# Patient Record
Sex: Male | Born: 1950
Health system: Southern US, Community
[De-identification: ages and names within clinical notes are randomized; demographics above are authoritative.]

## PROBLEM LIST (undated history)

## (undated) DIAGNOSIS — D649 Anemia, unspecified: Secondary | ICD-10-CM

## (undated) DIAGNOSIS — Z87442 Personal history of urinary calculi: Secondary | ICD-10-CM

## (undated) DIAGNOSIS — N390 Urinary tract infection, site not specified: Secondary | ICD-10-CM

## (undated) DIAGNOSIS — C61 Malignant neoplasm of prostate: Secondary | ICD-10-CM

---

## 2004-10-22 ENCOUNTER — Emergency Department (HOSPITAL_COMMUNITY): Admission: EM | Admit: 2004-10-22 | Discharge: 2004-10-23 | Payer: Self-pay | Admitting: Emergency Medicine

## 2004-11-06 ENCOUNTER — Ambulatory Visit (HOSPITAL_COMMUNITY): Admission: RE | Admit: 2004-11-06 | Discharge: 2004-11-06 | Payer: Self-pay | Admitting: Family Medicine

## 2007-10-12 ENCOUNTER — Emergency Department (HOSPITAL_COMMUNITY): Admission: EM | Admit: 2007-10-12 | Discharge: 2007-10-12 | Payer: Self-pay | Admitting: Emergency Medicine

## 2012-08-09 ENCOUNTER — Encounter: Payer: Self-pay | Admitting: Nurse Practitioner

## 2012-09-19 ENCOUNTER — Ambulatory Visit: Payer: Self-pay | Admitting: Nurse Practitioner

## 2019-02-20 ENCOUNTER — Emergency Department (HOSPITAL_COMMUNITY)
Admission: EM | Admit: 2019-02-20 | Discharge: 2019-02-21 | Disposition: A | Discharge status: Home / Self Care | Payer: Medicaid Other | Attending: Emergency Medicine | Admitting: Emergency Medicine

## 2019-02-20 ENCOUNTER — Emergency Department (HOSPITAL_COMMUNITY): Payer: Medicaid Other

## 2019-02-20 ENCOUNTER — Other Ambulatory Visit: Payer: Self-pay

## 2019-02-20 DIAGNOSIS — N400 Enlarged prostate without lower urinary tract symptoms: Secondary | ICD-10-CM | POA: Diagnosis not present

## 2019-02-20 DIAGNOSIS — R339 Retention of urine, unspecified: Secondary | ICD-10-CM | POA: Diagnosis not present

## 2019-02-20 DIAGNOSIS — K5900 Constipation, unspecified: Secondary | ICD-10-CM | POA: Insufficient documentation

## 2019-02-20 DIAGNOSIS — R319 Hematuria, unspecified: Secondary | ICD-10-CM | POA: Diagnosis not present

## 2019-02-20 DIAGNOSIS — R300 Dysuria: Secondary | ICD-10-CM | POA: Diagnosis present

## 2019-02-20 LAB — COMPREHENSIVE METABOLIC PANEL
ALT: 12 U/L (ref 0–44)
AST: 18 U/L (ref 15–41)
Albumin: 3.4 g/dL — ABNORMAL LOW (ref 3.5–5.0)
Alkaline Phosphatase: 64 U/L (ref 38–126)
Anion gap: 11 (ref 5–15)
BUN: 8 mg/dL (ref 8–23)
CO2: 25 mmol/L (ref 22–32)
Calcium: 9.5 mg/dL (ref 8.9–10.3)
Chloride: 103 mmol/L (ref 98–111)
Creatinine, Ser: 1.14 mg/dL (ref 0.61–1.24)
GFR calc Af Amer: >60 mL/min (ref >60)
GFR calc non Af Amer: >60 mL/min (ref >60)
Glucose, Bld: 121 mg/dL — ABNORMAL HIGH (ref 70–99)
Potassium: 3.7 mmol/L (ref 3.5–5.1)
Sodium: 139 mmol/L (ref 135–145)
Total Bilirubin: 0.5 mg/dL (ref 0.3–1.2)
Total Protein: 8.2 g/dL — ABNORMAL HIGH (ref 6.5–8.1)

## 2019-02-20 LAB — URINALYSIS, ROUTINE W REFLEX MICROSCOPIC
Bacteria, UA: NONE SEEN
Bilirubin Urine: NEGATIVE
Glucose, UA: NEGATIVE mg/dL
Ketones, ur: NEGATIVE mg/dL
Leukocytes,Ua: NEGATIVE
Nitrite: NEGATIVE
Protein, ur: 100 mg/dL — AB
RBC / HPF: >50 RBC/hpf — ABNORMAL HIGH (ref 0–5)
Specific Gravity, Urine: 1.017 (ref 1.005–1.030)
pH: 5 (ref 5.0–8.0)

## 2019-02-20 LAB — CBC
HCT: 41.6 % (ref 39.0–52.0)
Hemoglobin: 13.4 g/dL (ref 13.0–17.0)
MCH: 29.6 pg (ref 26.0–34.0)
MCHC: 32.2 g/dL (ref 30.0–36.0)
MCV: 91.8 fL (ref 80.0–100.0)
Platelets: 437 10*3/uL — ABNORMAL HIGH (ref 150–400)
RBC: 4.53 MIL/uL (ref 4.22–5.81)
RDW: 12.7 % (ref 11.5–15.5)
WBC: 8.8 10*3/uL (ref 4.0–10.5)
nRBC: 0 % (ref 0.0–0.2)

## 2019-02-20 MED ORDER — IOHEXOL 300 MG/ML  SOLN
100.0000 mL | Freq: Once | INTRAMUSCULAR | Status: AC | PRN
  Administered 2019-02-20: 23:00:00 100 mL via INTRAVENOUS

## 2019-02-20 NOTE — ED Triage Notes (Signed)
Pt endorses having intermittent difficulty urinating and constipation for several months ,worse last night. States it hurts to urinate or defecate. Axox4.

## 2019-02-20 NOTE — ED Notes (Signed)
Bladder scan 405 ml

## 2019-02-20 NOTE — ED Provider Notes (Signed)
[facility] Provider Note   CSN: [id number] Arrival date & time: 02/20/19  1545     History   Chief Complaint Chief Complaint  Patient presents with  . Dysuria  . Constipation    HPI [facility] Glass is a 68 y.o. male.     The history is provided by the patient and medical records. No language interpreter was used.  Dysuria Presenting symptoms: dysuria   Constipation Associated symptoms: dysuria    [facility] Glass is a 68 y.o. male who presents to the Emergency Department complaining of dysuria and constipation. He presents the emergency department complaining of dysuria and constipation that began over the last couple of days. Symptoms became severe last night with him only being able to have a dribbling urination. He has pain upon trying to urinate. He also reports constipation with only being able to pass small pellets. He does have some bilateral low back pain that he experiences with straining. He denies any fevers, chest pain, shortness of breath, nausea, vomiting, abdominal pain, numbness, weakness. He denies any medical problems but does not have a primary care provider. He lives alone. He works as a Theme park manager. No known COVID 19 exposures. He states that he has a remote history of kidney stones and those presenting symptoms were similar.  No past medical history on file.  There are no active problems to display for this patient.         Home Medications    Prior to Admission medications   Not on File    Family History No family history on file.  Social History Social History   Tobacco Use  . Smoking status: Not on file  Substance Use Topics  . Alcohol use: Not on file  . Drug use: Not on file     Allergies   Ibuprofen and Shrimp [shellfish allergy]   Review of Systems Review of Systems  Gastrointestinal: Positive for constipation.  Genitourinary: Positive for dysuria.  All other systems reviewed and are negative.     Physical Exam Updated Vital Signs BP (!) 162/82 (BP Location: Left Arm)   Pulse 67   Temp 98.1 F (36.7 C) (Oral)   Resp 16   SpO2 100%   Physical Exam Vitals signs and nursing note reviewed.  Constitutional:      Appearance: He is well-developed.  HENT:     Head: Normocephalic and atraumatic.  Cardiovascular:     Rate and Rhythm: Normal rate and regular rhythm.  Pulmonary:     Effort: Pulmonary effort is normal. No respiratory distress.  Abdominal:     Palpations: Abdomen is soft.     Tenderness: There is no abdominal tenderness. There is no guarding or rebound.  Genitourinary:    Penis: Normal.      Comments: Nontender rectal examination. Normal rectal tone. No gross blood. No stool and rectal vault. Musculoskeletal:        General: No tenderness.     Comments: 2+ DP pulses bilaterally. Five out of five strength and bilateral lower extremities with sensation to light touch intact and bilateral lower extremities  Skin:    General: Skin is warm and dry.  Neurological:     Mental Status: He is alert and oriented to person, place, and time.  Psychiatric:        Behavior: Behavior normal.      ED Treatments / Results  Labs (all labs ordered are listed, but only abnormal results are displayed) Labs  Reviewed  CBC - Abnormal; Notable for the following components:      Result Value   Platelets 437 (*)    All other components within normal limits  COMPREHENSIVE METABOLIC PANEL - Abnormal; Notable for the following components:   Glucose, Bld 121 (*)    Total Protein 8.2 (*)    Albumin 3.4 (*)    All other components within normal limits  URINALYSIS, ROUTINE W REFLEX MICROSCOPIC - Abnormal; Notable for the following components:   Hgb urine dipstick MODERATE (*)    Protein, ur 100 (*)    RBC / HPF >50 (*)    All other components within normal limits  URINE CULTURE    EKG Glass  Radiology Ct Abdomen Pelvis W Contrast  Result Date: 02/20/2019 CLINICAL DATA:   Hematuria. Difficulty urinating. Constipation. Weight loss. EXAM: CT ABDOMEN AND PELVIS WITH CONTRAST TECHNIQUE: Multidetector CT imaging of the abdomen and pelvis was performed using the standard protocol following bolus administration of intravenous contrast. CONTRAST:  160mL OMNIPAQUE IOHEXOL 300 MG/ML  SOLN COMPARISON:  [facility]. FINDINGS: Lower chest: Lung bases are clear. No focal consolidation, pleural fluid, or pulmonary nodule. Hepatobiliary: No focal liver abnormality is seen. No gallstones, gallbladder wall thickening, or biliary dilatation. Pancreas: No ductal dilatation or inflammation. Spleen: Normal in size without focal abnormality. A 15 mm nodule anterior inferior to the spleen in the left upper quadrant series 3, image 27, isodense to splenic parenchyma may represent a splenule but is nonspecific. Adrenals/Urinary Tract: No adrenal nodule. No hydronephrosis or perinephric edema. Homogeneous renal enhancement with symmetric excretion on delayed phase imaging. No suspicious renal mass. Left retroperitoneal adenopathy causes mass effect on the left renal artery and vein. Marked prostatomegaly which is irregular and causing mass effect on the bladder base. Bladder is poorly distended with diffuse bladder wall thickening and perivesicular stranding. Stomach/Bowel: Stomach is unremarkable. No small bowel obstruction or inflammatory change. Normal appendix. Small volume of colonic stool. Minimal distal diverticulosis without diverticulitis. No obvious colonic mass, lack of enteric contrast limits detailed assessment. Vascular/Lymphatic: Multifocal retroperitoneal adenopathy. Representative left periaortic lymph node at the level of the renal artery and vein, series 3, image 35 measures 4.7 x 3.4 cm with surrounding perilymphatic edema. Multiple additional enlarged retroperitoneal nodes extending from the right renal artery to the iliac bifurcation. Prominent bilateral common iliac nodes with mild  perilymphatic edema. Enlarged bilateral external iliac nodes, 19 mm on the left series 3, image 68, 18 mm on the right series 3, image 69. Additional pelvic sidewall adenopathy on the right measuring 14 mm, series 3, image 72. Right perivesicular node measures 8 mm, series 3, image 77. Multiple prominent lymph nodes adjacent to the distal sigmoid in the pelvis, images 67, 71 and 74 series 3. Mild bilateral iliac atherosclerosis. No aortic aneurysm. The portal vein is patent. Left upper quadrant 15 mm nodule, series 3, image 27, favor splenule over lymph node. Reproductive: Markedly enlarged heterogeneous prostate gland. Prostate margins are difficult to define, however prostate measures approximately 6.4 x 6.2 x 6.5 cm (volume = 140 cm^3). This causes mass effect on the urinary bladder. There is peri prostatic fat stranding. Other: Generalized retroperitoneal stranding. Trace edema in the pericolic gutters with minimal free fluid inferior to the liver tip. No free air. Small fat containing umbilical hernia. Musculoskeletal: Multiple nonspecific sclerotic foci within the pelvis and lumbar spine. Confluent sclerosis within posterosuperior T12 is most suspicious for sclerotic metastatic disease. IMPRESSION: 1. Markedly enlarged heterogeneous prostate gland causing mass effect  on the urinary bladder base. Multifocal pelvic and retroperitoneal lymphadenopathy suggestion of lymphadenitis. Overall findings are suspicious for prostate cancer with nodal metastasis. Recommend correlation with PSA. Alternatively, lymphoproliferative disorder such as lymphoma with BPH is also considered, but felt less likely. 2. Multiple sclerotic foci within the spine and pelvis, nonspecific in the setting, however confluent sclerosis within T12 vertebral body is most suspicious for sclerotic metastatic disease. Aortic Atherosclerosis (ICD10-I70.0). Electronically Signed   By: Keith Rake M.D.   On: 02/20/2019 23:32    Procedures  Procedures (including critical care time)  Medications Ordered in ED Medications - No data to display   Initial Impression / Assessment and Plan / ED Course  I have reviewed the triage vital signs and the nursing notes.  Pertinent labs & imaging results that were available during my care of the patient were reviewed by me and considered in my medical decision making (see chart for details).        Patient here for evaluation of progressive dysuria and constipation. He is non-toxic appearing on evaluation. Labs demonstrate normal renal function. Patient found to have 400 mL of urine in his bladder on bedside bladder scan. In and out catheter was performed with return of blood-tinged urine. Plan to obtain CT abdomen pelvis to further evaluate hematuria, dysuria and constipation.  CT demonstrates prostatic enlargement and lymphadenopathy concerning for malignancy.  D/w pt findings of studies.  Discussed with Dr. Lovena Neighbours with Urology.  Plan to place foley catheter with close outpatient Urology follow up.  Return precautions were discussed.    Final Clinical Impressions(s) / ED Diagnoses   Final diagnoses:  Urinary retention  Enlarged prostate    ED Discharge Orders    [facility]       Quintella Reichert, MD 02/21/19 1210

## 2019-02-21 LAB — URINE CULTURE: Culture: NO GROWTH

## 2019-02-21 MED ORDER — TAMSULOSIN HCL 0.4 MG PO CAPS: 0.4000 mg | ORAL_CAPSULE | Freq: Every day | ORAL | 0 refills | Status: DC

## 2019-02-21 NOTE — Discharge Instructions (Addendum)
You had a CT scan performed of your abdomen pelvis today. The CT scan showed that your prostate gland is enlarged and putting pressure on your bladder. You have multiple enlarged lymph nodes in your abdomen pelvis as well. It is very important that you follow-up with urology for further evaluation. If you develop fevers, blockage of your catheter or new concerning symptoms please present to the [facility] emergency department for repeat evaluation.

## 2019-02-26 ENCOUNTER — Emergency Department (HOSPITAL_COMMUNITY): Payer: Medicaid Other

## 2019-02-26 ENCOUNTER — Encounter (HOSPITAL_COMMUNITY): Payer: Self-pay

## 2019-02-26 ENCOUNTER — Other Ambulatory Visit: Payer: Self-pay

## 2019-02-26 ENCOUNTER — Emergency Department (HOSPITAL_COMMUNITY)
Admission: EM | Admit: 2019-02-26 | Discharge: 2019-02-26 | Disposition: A | Discharge status: Home / Self Care | Payer: Medicaid Other | Attending: Emergency Medicine | Admitting: Emergency Medicine

## 2019-02-26 DIAGNOSIS — N390 Urinary tract infection, site not specified: Secondary | ICD-10-CM | POA: Diagnosis not present

## 2019-02-26 DIAGNOSIS — K5900 Constipation, unspecified: Secondary | ICD-10-CM | POA: Insufficient documentation

## 2019-02-26 DIAGNOSIS — R339 Retention of urine, unspecified: Secondary | ICD-10-CM | POA: Insufficient documentation

## 2019-02-26 DIAGNOSIS — K760 Fatty (change of) liver, not elsewhere classified: Secondary | ICD-10-CM | POA: Diagnosis not present

## 2019-02-26 DIAGNOSIS — N429 Disorder of prostate, unspecified: Secondary | ICD-10-CM | POA: Diagnosis not present

## 2019-02-26 DIAGNOSIS — N4289 Other specified disorders of prostate: Secondary | ICD-10-CM

## 2019-02-26 LAB — CBC
HCT: 39.1 % (ref 39.0–52.0)
Hemoglobin: 12.6 g/dL — ABNORMAL LOW (ref 13.0–17.0)
MCH: 29.9 pg (ref 26.0–34.0)
MCHC: 32.2 g/dL (ref 30.0–36.0)
MCV: 92.9 fL (ref 80.0–100.0)
Platelets: 358 10*3/uL (ref 150–400)
RBC: 4.21 MIL/uL — ABNORMAL LOW (ref 4.22–5.81)
RDW: 12.9 % (ref 11.5–15.5)
WBC: 9.8 10*3/uL (ref 4.0–10.5)
nRBC: 0 % (ref 0.0–0.2)

## 2019-02-26 LAB — COMPREHENSIVE METABOLIC PANEL
ALT: 13 U/L (ref 0–44)
AST: 17 U/L (ref 15–41)
Albumin: 3.3 g/dL — ABNORMAL LOW (ref 3.5–5.0)
Alkaline Phosphatase: 71 U/L (ref 38–126)
Anion gap: 10 (ref 5–15)
BUN: 9 mg/dL (ref 8–23)
CO2: 25 mmol/L (ref 22–32)
Calcium: 8.9 mg/dL (ref 8.9–10.3)
Chloride: 103 mmol/L (ref 98–111)
Creatinine, Ser: 1.08 mg/dL (ref 0.61–1.24)
GFR calc Af Amer: >60 mL/min (ref >60)
GFR calc non Af Amer: >60 mL/min (ref >60)
Glucose, Bld: 129 mg/dL — ABNORMAL HIGH (ref 70–99)
Potassium: 3 mmol/L — ABNORMAL LOW (ref 3.5–5.1)
Sodium: 138 mmol/L (ref 135–145)
Total Bilirubin: 0.5 mg/dL (ref 0.3–1.2)
Total Protein: 7.9 g/dL (ref 6.5–8.1)

## 2019-02-26 LAB — URINALYSIS, ROUTINE W REFLEX MICROSCOPIC
Bilirubin Urine: NEGATIVE
Glucose, UA: NEGATIVE mg/dL
Ketones, ur: NEGATIVE mg/dL
Nitrite: POSITIVE — AB
Protein, ur: 100 mg/dL — AB
RBC / HPF: >50 RBC/hpf — ABNORMAL HIGH (ref 0–5)
Specific Gravity, Urine: 1.017 (ref 1.005–1.030)
WBC, UA: >50 WBC/hpf — ABNORMAL HIGH (ref 0–5)
pH: 6 (ref 5.0–8.0)

## 2019-02-26 LAB — LACTIC ACID, PLASMA: Lactic Acid, Venous: 1.5 mmol/L (ref 0.5–1.9)

## 2019-02-26 LAB — LIPASE, BLOOD: Lipase: 30 U/L (ref 11–51)

## 2019-02-26 MED ORDER — IOHEXOL 300 MG/ML  SOLN
100.0000 mL | Freq: Once | INTRAMUSCULAR | Status: AC | PRN
  Administered 2019-02-26: 100 mL via INTRAVENOUS

## 2019-02-26 MED ORDER — SODIUM CHLORIDE 0.9 % IV SOLN
1.0000 g | Freq: Once | INTRAVENOUS | Status: AC
  Administered 2019-02-26: 1 g via INTRAVENOUS
  Filled 2019-02-26: qty 10

## 2019-02-26 MED ORDER — KETOROLAC TROMETHAMINE 30 MG/ML IJ SOLN
30.0000 mg | Freq: Once | INTRAMUSCULAR | Status: AC
  Administered 2019-02-26: 30 mg via INTRAVENOUS
  Filled 2019-02-26: qty 1

## 2019-02-26 MED ORDER — POTASSIUM CHLORIDE CRYS ER 20 MEQ PO TBCR
40.0000 meq | EXTENDED_RELEASE_TABLET | Freq: Once | ORAL | Status: AC
  Administered 2019-02-26: 40 meq via ORAL
  Filled 2019-02-26: qty 2

## 2019-02-26 MED ORDER — ACETAMINOPHEN 325 MG PO TABS
650.0000 mg | ORAL_TABLET | Freq: Once | ORAL | Status: AC
Start: 2019-02-26 — End: 2019-02-26
  Administered 2019-02-26: 650 mg via ORAL
  Filled 2019-02-26: qty 2

## 2019-02-26 MED ORDER — CEPHALEXIN 500 MG PO CAPS: 500.0000 mg | ORAL_CAPSULE | Freq: Three times a day (TID) | ORAL | 0 refills | Status: DC

## 2019-02-26 MED ORDER — POTASSIUM CHLORIDE 10 MEQ/100ML IV SOLN
10.0000 meq | Freq: Once | INTRAVENOUS | Status: AC
  Administered 2019-02-26: 21:00:00 10 meq via INTRAVENOUS
  Filled 2019-02-26: qty 100

## 2019-02-26 MED ORDER — SODIUM CHLORIDE (PF) 0.9 % IJ SOLN
INTRAMUSCULAR | Status: AC
  Filled 2019-02-26: qty 50

## 2019-02-26 MED ORDER — SODIUM CHLORIDE 0.9% FLUSH: 3.0000 mL | Freq: Once | INTRAVENOUS | Status: DC

## 2019-02-26 MED ORDER — SODIUM CHLORIDE 0.9 % IV BOLUS
1000.0000 mL | Freq: Once | INTRAVENOUS | Status: AC
Start: 2019-02-26 — End: 2019-02-26
  Administered 2019-02-26: 21:00:00 1000 mL via INTRAVENOUS

## 2019-02-26 NOTE — ED Triage Notes (Signed)
Pt states that he has not had a BM for 4 days. Pt states that he also noticed his foley bag is not filling up as quick as it once was.

## 2019-02-26 NOTE — Discharge Instructions (Signed)
You have possible prostate cancer with mets to your bone. This is likely causing your pain   You also may have urinary track infection   Take keflex as prescribed   See urology tomorrow as scheduled   Return to ER if you have fever, vomiting, foley not draining.

## 2019-02-26 NOTE — ED Notes (Signed)
Bladder Scan: 0ML ?

## 2019-02-26 NOTE — ED Provider Notes (Signed)
[place] DEPT Provider Note   CSN: [id number] Arrival date & time: 02/26/19  1756     History   Chief Complaint Chief Complaint  Patient presents with  . Constipation  . Urinary Retention    HPI Andre Glass is a 68 y.o. male history of constipation, recent urinary retention with Foley, here presenting with low-grade temperature and left lower quadrant pain and constipation.  Patient states that he was seen here about a week ago and was found to be in urinary retention .  Patient had a Foley catheter placed.  Patient was also started on Flomax. Patient states that he has been constipated since then.  Patient has no bowel movement for the last 4 days.  He also noticed that his Foley bag does not follow-up as much.  He also noticed some low-grade temperature and left lower quadrant pain as well.  Denies any nausea or vomiting.     The history is provided by the patient.    No past medical history on file.  There are no active problems to display for this patient.    Home Medications    Prior to Admission medications   Medication Sig Start Date End Date Taking? Authorizing Provider  tamsulosin (FLOMAX) 0.4 MG CAPS capsule Take 1 capsule (0.4 mg total) by mouth daily. 02/21/19   Quintella Reichert, MD    Family History No family history on file.  Social History Social History   Tobacco Use  . Smoking status: Not on file  Substance Use Topics  . Alcohol use: Not on file  . Drug use: Not on file     Allergies   Ibuprofen and Shrimp [shellfish allergy]   Review of Systems Review of Systems  Gastrointestinal: Positive for constipation.  All other systems reviewed and are negative.    Physical Exam Updated Vital Signs BP (!) 177/80   Pulse 95   Temp 100.1 F (37.8 C) (Oral)   Resp 15   SpO2 100%   Physical Exam Vitals signs and nursing note reviewed.  HENT:     Head: Normocephalic.     Nose: Nose normal.   Mouth/Throat:     Mouth: Mucous membranes are dry.  Eyes:     Extraocular Movements: Extraocular movements intact.     Pupils: Pupils are equal, round, and reactive to light.  Neck:     Musculoskeletal: Normal range of motion and neck supple.  Cardiovascular:     Rate and Rhythm: Normal rate and regular rhythm.     Pulses: Normal pulses.     Heart sounds: Normal heart sounds.  Pulmonary:     Effort: Pulmonary effort is normal.     Breath sounds: Normal breath sounds.  Abdominal:     General: Abdomen is flat.     Comments: Mild LUQ and LLQ tenderness. Foley in place   Genitourinary:    Comments: Rectal- no stool impaction  Musculoskeletal: Normal range of motion.  Skin:    General: Skin is warm.     Capillary Refill: Capillary refill takes less than 2 seconds.  Neurological:     General: No focal deficit present.     Mental Status: He is alert and oriented to person, place, and time.  Psychiatric:        Mood and Affect: Mood normal.        Behavior: Behavior normal.      ED Treatments / Results  Labs (all labs ordered are listed, but only  abnormal results are displayed) Labs Reviewed  COMPREHENSIVE METABOLIC PANEL - Abnormal; Notable for the following components:      Result Value   Potassium 3.0 (*)    Glucose, Bld 129 (*)    Albumin 3.3 (*)    All other components within normal limits  CBC - Abnormal; Notable for the following components:   RBC 4.21 (*)    Hemoglobin 12.6 (*)    All other components within normal limits  CULTURE, BLOOD (ROUTINE X 2)  CULTURE, BLOOD (ROUTINE X 2)  URINE CULTURE  LIPASE, BLOOD  URINALYSIS, ROUTINE W REFLEX MICROSCOPIC  LACTIC ACID, PLASMA  LACTIC ACID, PLASMA    EKG None  Radiology No results found.  Procedures Procedures (including critical care time)  EMERGENCY DEPARTMENT ULTRASOUND  Study: Limited Ultrasound of Bladder  INDICATIONS: to assess for urinary retention and/or bladder volume prior to urinary catheter  Multiple views of the bladder were obtained in real-time in the transverse and longitudinal planes with a multi-frequency probe.  PERFORMED BY: Myself IMAGES ARCHIVED?: none LIMITATIONS: none INTERPRETATION: foley in bladder         Medications Ordered in ED Medications  sodium chloride flush (NS) 0.9 % injection 3 mL (has no administration in time range)  potassium chloride 10 mEq in 100 mL IVPB (10 mEq Intravenous New Bag/Given 02/26/19 2104)  sodium chloride 0.9 % bolus 1,000 mL (1,000 mLs Intravenous New Bag/Given 02/26/19 2102)  acetaminophen (TYLENOL) tablet 650 mg (650 mg Oral Given 02/26/19 2102)  potassium chloride SA (KLOR-CON) CR tablet 40 mEq (40 mEq Oral Given 02/26/19 2104)     Initial Impression / Assessment and Plan / ED Course  I have reviewed the triage vital signs and the nursing notes.  Pertinent labs & imaging results that were available during my care of the patient were reviewed by me and considered in my medical decision making (see chart for details).       [facility] Glass is a 68 y.o. male here presenting with constipation.  Patient recently had foley placed and I confirmed that it is still in the bladder and bladder is decompressed. No obvious stool impaction on rectal exam. Consider diverticulitis vs colitis vs partial SBO. Will get labs, UA, CT ab/pel   10:30 PM WBC is normal. Lactate normal. UA + UTI. CT showed possible prostate cancer. Since he has low grade temp at home, will give rocephin and dc home with keflex. Urine culture sent. No signs of SBO. He has urology follow up tomorrow.    Final Clinical Impressions(s) / ED Diagnoses   Final diagnoses:  None    ED Discharge Orders    None       Drenda Freeze, MD 02/26/19 2232

## 2019-02-28 LAB — URINE CULTURE: Culture: >=100000 — AB

## 2019-03-01 ENCOUNTER — Telehealth: Payer: Self-pay | Admitting: Emergency Medicine

## 2019-03-01 DIAGNOSIS — R3914 Feeling of incomplete bladder emptying: Secondary | ICD-10-CM | POA: Diagnosis not present

## 2019-03-01 DIAGNOSIS — R3912 Poor urinary stream: Secondary | ICD-10-CM | POA: Diagnosis not present

## 2019-03-01 DIAGNOSIS — N403 Nodular prostate with lower urinary tract symptoms: Secondary | ICD-10-CM | POA: Diagnosis not present

## 2019-03-01 NOTE — Telephone Encounter (Signed)
Post ED Visit - Positive Culture Follow-up  Culture report reviewed by antimicrobial stewardship pharmacist: [place] Team []  Nathan Batchelder, Pharm.D. []  [address], Pharm.D., BCPS AQ-ID []  Heide Guile, Pharm.D., BCPS []  Parks Neptune, Pharm.D., BCPS []  [place], Pharm.D., BCPS, AAHIVP []  [place], Pharm.D., BCPS, AAHIVP []  Legrand Como, PharmD, BCPS []  Salome Arnt, PharmD, BCPS []  Johnnette Gourd, PharmD, BCPS []  Hughes Better, PharmD []  Leeroy Cha, PharmD, BCPS []  Laqueta [place], PharmD  [place] Team []  [address], PharmD []  Leodis Sias, PharmD [x]  [place] Spar, PharmD []  Royetta Asal, Rph []  Graylin Shiver) Rema Fendt, PharmD []  Glennon Mac, PharmD []  Arlyn Dunning, PharmD []  Netta Cedars, PharmD []  Dia Sitter, PharmD []  Leone Haven, PharmD []  Gretta Arab, PharmD []  Theodis Shove, PharmD []  Peggyann Juba, PharmD   Positive urine culture Treated with cephalexin, faxed report to Dr winter @ [facility].  07-29-1984 03/01/2019, 2:43 PM

## 2019-03-01 NOTE — Progress Notes (Signed)
.   ED Antimicrobial Stewardship Positive Culture Follow Up   Andre Glass is an 68 y.o. male who presented to [facility] on 02/26/2019 with a chief complaint of  Chief Complaint  Patient presents with  . Constipation  . Urinary Retention    Recent Results (from the past 720 hour(s))  Urine culture     Status: None   Collection Time: 02/20/19 10:00 PM   Specimen: Urine, Random  Result Value Ref Range Status   Specimen Description URINE, RANDOM  Final   Special Requests NONE  Final   Culture   Final    NO GROWTH Performed at [facility], [address], [place], [place] [address]    Report Status 02/21/2019 FINAL  Final  Blood culture (routine x 2)     Status: None (Preliminary result)   Collection Time: 02/26/19  8:45 PM   Specimen: BLOOD RIGHT HAND  Result Value Ref Range Status   Specimen Description BLOOD RIGHT HAND  Final   Special Requests   Final    BOTTLES DRAWN AEROBIC AND ANAEROBIC Blood Culture results may not be optimal due to an inadequate volume of blood received in culture bottles   Culture   Final    NO GROWTH 2 DAYS Performed at [facility], [place] [address] [place], [place] [address]    Report Status PENDING  Incomplete  Urine culture     Status: Abnormal   Collection Time: 02/26/19  8:45 PM   Specimen: Urine, Clean Catch  Result Value Ref Range Status   Specimen Description   Final    URINE, CLEAN CATCH Performed at [facility], [place] [address], [place], [place] [address]    Special Requests   Final    NONE Performed at [facility], [place] [address] [place], [place] [address]    Culture >=100,000 COLONIES/mL [facility] MORGANII (A)  Final   Report Status 02/28/2019 FINAL  Final   Organism ID, Bacteria MORGANELLA MORGANII (A)  Final      Susceptibility   Morganella morganii - MIC*    AMPICILLIN >=32 RESISTANT Resistant     CEFAZOLIN >=64 RESISTANT Resistant     CEFTRIAXONE <=1 SENSITIVE Sensitive      CIPROFLOXACIN <=0.25 SENSITIVE Sensitive     GENTAMICIN <=1 SENSITIVE Sensitive     IMIPENEM 4 SENSITIVE Sensitive     NITROFURANTOIN 128 RESISTANT Resistant     TRIMETH/SULFA <=20 SENSITIVE Sensitive     AMPICILLIN/SULBACTAM >=32 RESISTANT Resistant     PIP/TAZO <=4 SENSITIVE Sensitive     * >=100,000 COLONIES/mL MORGANELLA MORGANII    [x]  Treated with cephalexin, organism resistant to prescribed antimicrobial []  Patient discharged originally without antimicrobial agent and treatment is now indicated  New antibiotic prescription:  - Please check with pt regarding symptoms - Please check with pt regarding follow-up with urology - Please notify Dr. Jackson Latino office at [facility] regarding positive urine culture and follow-up their recommendations  ED Provider: Dr. Clydia Llano, PharmD, BCPS 03/01/2019 11:23 AM Clinical Pharmacist [phone]

## 2019-03-04 LAB — CULTURE, BLOOD (ROUTINE X 2): Culture: NO GROWTH

## 2019-03-08 DIAGNOSIS — R3915 Urgency of urination: Secondary | ICD-10-CM | POA: Diagnosis not present

## 2019-03-08 DIAGNOSIS — R351 Nocturia: Secondary | ICD-10-CM | POA: Diagnosis not present

## 2019-03-08 DIAGNOSIS — R3912 Poor urinary stream: Secondary | ICD-10-CM | POA: Diagnosis not present

## 2019-03-08 DIAGNOSIS — N403 Nodular prostate with lower urinary tract symptoms: Secondary | ICD-10-CM | POA: Diagnosis not present

## 2019-03-08 DIAGNOSIS — R3914 Feeling of incomplete bladder emptying: Secondary | ICD-10-CM | POA: Diagnosis not present

## 2019-03-09 DIAGNOSIS — C61 Malignant neoplasm of prostate: Secondary | ICD-10-CM | POA: Diagnosis not present

## 2019-03-09 DIAGNOSIS — N403 Nodular prostate with lower urinary tract symptoms: Secondary | ICD-10-CM | POA: Diagnosis not present

## 2019-03-14 DIAGNOSIS — C7951 Secondary malignant neoplasm of bone: Secondary | ICD-10-CM | POA: Diagnosis not present

## 2019-03-14 DIAGNOSIS — C775 Secondary and unspecified malignant neoplasm of intrapelvic lymph nodes: Secondary | ICD-10-CM | POA: Diagnosis not present

## 2019-03-14 DIAGNOSIS — C61 Malignant neoplasm of prostate: Secondary | ICD-10-CM | POA: Diagnosis not present

## 2019-03-16 ENCOUNTER — Telehealth: Payer: Self-pay | Admitting: Oncology

## 2019-03-16 NOTE — Telephone Encounter (Signed)
Received a new patient referral from Dr. [name] for prostate cancer. [name] has been cld and scheduled to see Dr. [name] on 12/15 at 2pm. Pt aware to arrive 15 minutes early.

## 2019-03-21 ENCOUNTER — Encounter: Payer: Self-pay | Admitting: Medical Oncology

## 2019-03-21 ENCOUNTER — Other Ambulatory Visit: Payer: Self-pay

## 2019-03-21 ENCOUNTER — Inpatient Hospital Stay: Payer: Medicaid Other | Attending: Oncology | Admitting: Oncology

## 2019-03-21 VITALS — BP 161/75 | HR 77 | Temp 98.3°F | Resp 18 | Ht 70.0 in | Wt 224.0 lb

## 2019-03-21 DIAGNOSIS — C61 Malignant neoplasm of prostate: Secondary | ICD-10-CM

## 2019-03-21 MED ORDER — TRAMADOL HCL 50 MG PO TABS: 50.0000 mg | ORAL_TABLET | Freq: Four times a day (QID) | ORAL | 0 refills | Status: DC | PRN

## 2019-03-21 NOTE — Progress Notes (Signed)
Introduced myself to [name] and his bishop, as the the prostate nurse navigator and discussed my role. He was recently diagnosed with metastatic prostate cancer and states he is overwhelmed. He states the consult with Dr. [name] went well and he will need to get a bone scan before starting treatment. He is not sure if he wants to proceed with bilateral orchiectomy or get ADT injections but knows he needs to make a decision quickly.  We discussed the importance of getting treatment started, to control the cancer. Dr. [name] discussed starting Zytiga or Gillermina Phy after he gets bone scan results.   I informed him of our support services here at [facility] and the prostate support group, that could give him added support. I gave him my contact information and asked him to call me with questions or concerns.

## 2019-03-21 NOTE — Progress Notes (Signed)
Reason for the request: Prostate cancer     HPI: I was asked by Dr. [name] to evaluate Andre Glass for new diagnosis of prostate cancer.  Andre Glass is a Andre Glass without any significant comorbid conditions presented to the emergency department in November 2020 with complaints of urinary retention.  Andre Glass was started on Flomax and a Foley catheter was placed.  CT scan obtained on February 26, 2019 showed enlarged prostate gland with evidence of pelvic and retroperitoneal lymphadenopathy as well as possible suspicious for osseous involvement.  Andre Glass was evaluated by Dr. [name] and his PSA was 366 and underwent a prostate biopsy on December 3 of 2020.  The final pathology showed high volume disease with a Gleason score 4+5 = 9 as well as Gleason score 4+4 = 8 and 12 of 12 cores.  Andre Glass was offered androgen deprivation versus orchiectomy although Andre Glass is not sure how to proceed yet.  Andre Glass does report pelvic and lower extremity pain bilaterally but still able to ambulate.  His appetite has decreased but has not lost a lot of weight.  Andre Glass still has urinary retention with a Foley catheter in place.  Andre Glass denied any pathological fractures or falls.  Andre Glass denies any syncope.  Andre Glass does have a brother that is currently under evaluation for prostate cancer.  Andre Glass does not report any headaches, blurry vision, syncope or seizures. Does not report any fevers, chills or sweats.  Does not report any cough, wheezing or hemoptysis.  Does not report any chest pain, palpitation, orthopnea or leg edema.  Does not report any nausea, vomiting or abdominal pain.  Does not report any constipation or diarrhea.  Does not report any skeletal complaints.    Does not report frequency, urgency or hematuria.  Does not report any skin rashes or lesions. Does not report any heat or cold intolerance.  Does not report any lymphadenopathy or petechiae.  Does not report any anxiety or depression.  Remaining review of systems is negative.    Past medical history: Andre Glass denies  any history of hypertension, diabetes or coronary disease although Andre Glass has not been taking regular medical attention.   Current Outpatient Medications:  .  acetaminophen (TYLENOL) 325 MG tablet, Take 650 mg by mouth every 6 (six) hours as needed for mild pain., Disp: , Rfl:  .  cephALEXin (KEFLEX) 500 MG capsule, Take 1 capsule (500 mg total) by mouth 3 (three) times daily., Disp: 21 capsule, Rfl: 0 .  docusate sodium (COLACE) 100 MG capsule, Take 100 mg by mouth daily as needed for mild constipation or moderate constipation., Disp: , Rfl:  .  Pseudoephedrine HCl (SUDAFED) 30 MG/5ML LIQD, Take 30 mg by mouth 4 (four) times daily as needed for congestion., Disp: , Rfl:  .  tamsulosin (FLOMAX) 0.4 MG CAPS capsule, Take 1 capsule (0.4 mg total) by mouth daily., Disp: 30 capsule, Rfl: 0:  Allergies  Allergen Reactions  . Ibuprofen Anaphylaxis  . Shrimp [Shellfish Allergy] Anaphylaxis  :  No family history on file.:  Social History   Socioeconomic History  . Marital status: Single    Spouse name: Not on file  . Number of children: Not on file  . Years of education: Not on file  . Highest education level: Not on file  Occupational History  . Not on file  Tobacco Use  . Smoking status: Not on file  Substance and Sexual Activity  . Alcohol use: Not on file  . Drug use: Not on file  .  Sexual activity: Not on file  Other Topics Concern  . Not on file  Social History Narrative  . Not on file   Social Determinants of Health   Financial Resource Strain:   . Difficulty of Paying Living Expenses: Not on file  Food Insecurity:   . Worried About Charity fundraiser in the Last Year: Not on file  . Ran Out of Food in the Last Year: Not on file  Transportation Needs:   . Lack of Transportation (Medical): Not on file  . Lack of Transportation (Non-Medical): Not on file  Physical Activity:   . Days of Exercise per Week: Not on file  . Minutes of Exercise per Session: Not on file   Stress:   . Feeling of Stress : Not on file  Social Connections:   . Frequency of Communication with Friends and Family: Not on file  . Frequency of Social Gatherings with Friends and Family: Not on file  . Attends Religious Services: Not on file  . Active Member of Clubs or Organizations: Not on file  . Attends Archivist Meetings: Not on file  . Marital Status: Not on file  Intimate Partner Violence:   . Fear of Current or Ex-Partner: Not on file  . Emotionally Abused: Not on file  . Physically Abused: Not on file  . Sexually Abused: Not on file  :  Pertinent items are noted in HPI.  Exam: Blood pressure (!) 161/75, pulse 77, temperature 98.3 F (36.8 C), temperature source Temporal, resp. rate 18, height 6' (1.829 m), weight 224 lb (101.6 kg), SpO2 98 %.  ECOG 1  General appearance: alert and cooperative appeared without distress. Head: atraumatic without any abnormalities. Eyes: conjunctivae/corneas clear. PERRL.  Sclera anicteric. Throat: lips, mucosa, and tongue normal; without oral thrush or ulcers. Resp: clear to auscultation bilaterally without rhonchi, wheezes or dullness to percussion. Cardio: regular rate and rhythm, S1, S2 normal, no murmur, click, rub or gallop GI: soft, non-tender; bowel sounds normal; no masses,  no organomegaly Skin: Skin color, texture, turgor normal. No rashes or lesions Lymph nodes: Cervical, supraclavicular, and axillary nodes normal. Neurologic: Grossly normal without any motor, sensory or deep tendon reflexes. Musculoskeletal: No joint deformity or effusion.  CBC    Component Value Date/Time   WBC 9.8 02/26/2019 1850   RBC 4.21 (L) 02/26/2019 1850   HGB 12.6 (L) 02/26/2019 1850   HCT 39.1 02/26/2019 1850   PLT 358 02/26/2019 1850   MCV 92.9 02/26/2019 1850   MCH 29.9 02/26/2019 1850   MCHC 32.2 02/26/2019 1850   RDW 12.9 02/26/2019 1850     Chemistry      Component Value Date/Time   NA 138 02/26/2019 1850   K 3.0  (L) 02/26/2019 1850   CL 103 02/26/2019 1850   CO2 25 02/26/2019 1850   BUN 9 02/26/2019 1850   CREATININE 1.08 02/26/2019 1850      Component Value Date/Time   CALCIUM 8.9 02/26/2019 1850   ALKPHOS 71 02/26/2019 1850   AST 17 02/26/2019 1850   ALT 13 02/26/2019 1850   BILITOT 0.5 02/26/2019 1850       CT ABDOMEN PELVIS W CONTRAST  Result Date: 02/26/2019 CLINICAL DATA:  No bowel movement for several days EXAM: CT ABDOMEN AND PELVIS WITH CONTRAST TECHNIQUE: Multidetector CT imaging of the abdomen and pelvis was performed using the standard protocol following bolus administration of intravenous contrast. CONTRAST:  167mL OMNIPAQUE IOHEXOL 300 MG/ML  SOLN COMPARISON:  02/20/2019 FINDINGS: Lower chest: No acute abnormality. Hepatobiliary: Diffuse fatty infiltration of the liver is noted. The gallbladder is decompressed. Pancreas: Unremarkable. No pancreatic ductal dilatation or surrounding inflammatory changes. Spleen: Normal in size without focal abnormality. Adrenals/Urinary Tract: Adrenal glands are within normal limits. Kidneys demonstrate a normal enhancement pattern bilaterally. No obstructive changes are seen. No renal calculi are noted. The bladder is decompressed by Foley catheter. The Foley catheter is somewhat deviated to the left related to an enlarged prostate. Stomach/Bowel: The appendix is within normal limits. No obstructive or inflammatory changes of the larger small-bowel are seen. Mild diverticular change is noted without diverticulitis. Vascular/Lymphatic: Abdominal aorta demonstrates mild atherosclerotic calcifications. There are changes of retroperitoneal lymphadenopathy similar to that seen on the prior exam although some slight enlargement of the prominent nodal mass adjacent to the aorta on the left is seen now measuring 5.2 cm with increased peripheral inflammatory change. Scattered iliac lymph nodes are seen bilaterally also similar to that noted on the prior exam.  Reproductive: Prostate is enlarged with deviation of the urethra to the left as noted by the Foley catheter. The prostate indents upon the inferior aspect of the bladder similar to that seen on the prior exam. Again these changes are highly suspicious for prostate carcinoma with nodal metastatic disease. Other: No abdominal wall hernia or abnormality. No abdominopelvic ascites. Musculoskeletal: Degenerative changes of lumbar spine are seen. Scattered small sclerotic foci are noted as well. IMPRESSION: Enlarged prostate similar to that seen on the prior exam with evidence of pelvic and retroperitoneal lymphadenopathy. These changes are again suspicious for prostate carcinoma with nodal metastatic disease. Bony sclerotic foci are noted also suspicious for metastatic disease. Correlation with PSA is recommended. Fatty infiltration of the liver. No new focal abnormality is seen when compared with the prior study. Aortic Atherosclerosis (ICD10-I70.0). Electronically Signed   By: Inez Catalina M.D.   On: 02/26/2019 22:05   CT Abdomen Pelvis W Contrast  Result Date: 02/20/2019 CLINICAL DATA:  Hematuria. Difficulty urinating. Constipation. Weight loss. EXAM: CT ABDOMEN AND PELVIS WITH CONTRAST TECHNIQUE: Multidetector CT imaging of the abdomen and pelvis was performed using the standard protocol following bolus administration of intravenous contrast. CONTRAST:  115mL OMNIPAQUE IOHEXOL 300 MG/ML  SOLN COMPARISON:  None. FINDINGS: Lower chest: Lung bases are clear. No focal consolidation, pleural fluid, or pulmonary nodule. Hepatobiliary: No focal liver abnormality is seen. No gallstones, gallbladder wall thickening, or biliary dilatation. Pancreas: No ductal dilatation or inflammation. Spleen: Normal in size without focal abnormality. A 15 mm nodule anterior inferior to the spleen in the left upper quadrant series 3, image 27, isodense to splenic parenchyma may represent a splenule but is nonspecific. Adrenals/Urinary  Tract: No adrenal nodule. No hydronephrosis or perinephric edema. Homogeneous renal enhancement with symmetric excretion on delayed phase imaging. No suspicious renal mass. Left retroperitoneal adenopathy causes mass effect on the left renal artery and vein. Marked prostatomegaly which is irregular and causing mass effect on the bladder base. Bladder is poorly distended with diffuse bladder wall thickening and perivesicular stranding. Stomach/Bowel: Stomach is unremarkable. No small bowel obstruction or inflammatory change. Normal appendix. Small volume of colonic stool. Minimal distal diverticulosis without diverticulitis. No obvious colonic mass, lack of enteric contrast limits detailed assessment. Vascular/Lymphatic: Multifocal retroperitoneal adenopathy. Representative left periaortic lymph node at the level of the renal artery and vein, series 3, image 35 measures 4.7 x 3.4 cm with surrounding perilymphatic edema. Multiple additional enlarged retroperitoneal nodes extending from the right renal artery to the  iliac bifurcation. Prominent bilateral common iliac nodes with mild perilymphatic edema. Enlarged bilateral external iliac nodes, 19 mm on the left series 3, image 68, 18 mm on the right series 3, image Andre. Additional pelvic sidewall adenopathy on the right measuring 14 mm, series 3, image 72. Right perivesicular node measures 8 mm, series 3, image 77. Multiple prominent lymph nodes adjacent to the distal sigmoid in the pelvis, images 67, 71 and Andre series 3. Mild bilateral iliac atherosclerosis. No aortic aneurysm. The portal vein is patent. Left upper quadrant 15 mm nodule, series 3, image 27, favor splenule over lymph node. Reproductive: Markedly enlarged heterogeneous prostate gland. Prostate margins are difficult to define, however prostate measures approximately 6.4 x 6.2 x 6.5 cm (volume = 140 cm^3). This causes mass effect on the urinary bladder. There is peri prostatic fat stranding. Other:  Generalized retroperitoneal stranding. Trace edema in the pericolic gutters with minimal free fluid inferior to the liver tip. No free air. Small fat containing umbilical hernia. Musculoskeletal: Multiple nonspecific sclerotic foci within the pelvis and lumbar spine. Confluent sclerosis within posterosuperior T12 is most suspicious for sclerotic metastatic disease. IMPRESSION: 1. Markedly enlarged heterogeneous prostate gland causing mass effect on the urinary bladder base. Multifocal pelvic and retroperitoneal lymphadenopathy suggestion of lymphadenitis. Overall findings are suspicious for prostate cancer with nodal metastasis. Recommend correlation with PSA. Alternatively, lymphoproliferative disorder such as lymphoma with BPH is also considered, but felt less likely. 2. Multiple sclerotic foci within the spine and pelvis, nonspecific in the setting, however confluent sclerosis within T12 vertebral body is most suspicious for sclerotic metastatic disease. Aortic Atherosclerosis (ICD10-I70.0). Electronically Signed   By: Keith Rake M.D.   On: 02/20/2019 23:32    Assessment and Plan:    Andre Glass with:  1.  Prostate cancer diagnosed in December 2020 after presenting with a PSA of 366 and Gleason score of 4+5 = 9.  Andre Glass has advanced castration-sensitive disease with noted lymphadenopathy and possible pelvic status post.  The natural course of this disease was reviewed today and treatment options were outlined.  Andre Glass understands his disease is incurable although his disease is certainly treatable and can be palliated for an extended period of time.  The backbone of treating his condition would include androgen deprivation with that will be in the form of [facility] agonists or bilateral orchiectomy.  Risks and benefits of both approaches were discussed and Andre Glass is considering those options.  I urged him to make an decision in the immediate future to expedite his recovery.  Andre Glass will contact me in the near  future if Andre Glass elected to proceed with androgen deprivation therapy.  Andre Glass will contact Dr. [name] Andre Glass opted to proceed with surgery.  The role for therapy escalation was discussed at this time.  That will be in the form of Zytiga, Xtandi or systemic chemotherapy.  Complication associated with this therapy was reviewed.  These include nausea, fatigue, hypertension associated with Zytiga.  Myelosuppression, neutropenia and possible sepsis is associated with chemotherapy.  Andre Glass would be a reasonable candidate for Zytiga which we will start after starting androgen deprivation and updating staging with a bone scan.  2.  Androgen deprivation therapy: Andre Glass still considering orchiectomy versus chemical castration.  3.  Bone directed therapy: We will address that in the future after obtaining bone scan.  I recommended calcium and vitamin D supplements in the interim.  4.  Bone pain: Prescription for Ultram will be made available to him to control his pain.  5.  Urinary retention: Related to his prostate cancer.  Andre Glass is following with Dr. [name] regarding this issue.  6.  Follow-up: Will be in the next few weeks to follow his progress.   60  minutes was spent with the patient face-to-face today.  More than 50% of time was spent on reviewing disease status, treatment options and answering questions regarding future plan of care.   Thank you for the referral. A copy of this consult has been forwarded to the requesting physician.

## 2019-03-22 ENCOUNTER — Telehealth: Payer: Self-pay | Admitting: Oncology

## 2019-03-22 NOTE — Telephone Encounter (Signed)
Scheduled appt per 12/15 los.  Left a vm of the appt date and time. 

## 2019-03-28 ENCOUNTER — Other Ambulatory Visit: Payer: Self-pay | Admitting: Oncology

## 2019-03-28 MED ORDER — HYDROCODONE-ACETAMINOPHEN 5-325 MG PO TABS: 1.0000 | ORAL_TABLET | Freq: Four times a day (QID) | ORAL | 0 refills | Status: DC | PRN

## 2019-04-06 ENCOUNTER — Other Ambulatory Visit: Payer: Self-pay | Admitting: Urology

## 2019-04-06 MED ORDER — OXYBUTYNIN CHLORIDE 5 MG PO TABS: 5.0000 mg | ORAL_TABLET | Freq: Three times a day (TID) | ORAL | 0 refills | Status: DC | PRN

## 2019-04-06 NOTE — Progress Notes (Signed)
Sent in Ditropan

## 2019-04-08 ENCOUNTER — Encounter (HOSPITAL_COMMUNITY): Payer: Self-pay

## 2019-04-08 ENCOUNTER — Other Ambulatory Visit: Payer: Self-pay

## 2019-04-08 ENCOUNTER — Emergency Department (HOSPITAL_COMMUNITY): Payer: Medicaid Other

## 2019-04-08 ENCOUNTER — Emergency Department (HOSPITAL_COMMUNITY)
Admission: EM | Admit: 2019-04-08 | Discharge: 2019-04-08 | Disposition: A | Discharge status: Home / Self Care | Payer: Medicaid Other | Attending: Emergency Medicine | Admitting: Emergency Medicine

## 2019-04-08 DIAGNOSIS — Z436 Encounter for attention to other artificial openings of urinary tract: Secondary | ICD-10-CM | POA: Insufficient documentation

## 2019-04-08 DIAGNOSIS — C61 Malignant neoplasm of prostate: Secondary | ICD-10-CM | POA: Insufficient documentation

## 2019-04-08 DIAGNOSIS — R310 Gross hematuria: Secondary | ICD-10-CM | POA: Diagnosis not present

## 2019-04-08 DIAGNOSIS — R1030 Lower abdominal pain, unspecified: Secondary | ICD-10-CM | POA: Diagnosis not present

## 2019-04-08 DIAGNOSIS — R58 Hemorrhage, not elsewhere classified: Secondary | ICD-10-CM | POA: Diagnosis not present

## 2019-04-08 DIAGNOSIS — I10 Essential (primary) hypertension: Secondary | ICD-10-CM | POA: Diagnosis not present

## 2019-04-08 DIAGNOSIS — R52 Pain, unspecified: Secondary | ICD-10-CM | POA: Diagnosis not present

## 2019-04-08 LAB — CBC WITH DIFFERENTIAL/PLATELET
Abs Immature Granulocytes: 0.03 10*3/uL (ref 0.00–0.07)
Basophils Absolute: 0 10*3/uL (ref 0.0–0.1)
Basophils Relative: 1 %
Eosinophils Absolute: 0.2 10*3/uL (ref 0.0–0.5)
Eosinophils Relative: 2 %
HCT: 35.5 % — ABNORMAL LOW (ref 39.0–52.0)
Hemoglobin: 11.3 g/dL — ABNORMAL LOW (ref 13.0–17.0)
Immature Granulocytes: 0 %
Lymphocytes Relative: 19 %
Lymphs Abs: 1.5 10*3/uL (ref 0.7–4.0)
MCH: 28.8 pg (ref 26.0–34.0)
MCHC: 31.8 g/dL (ref 30.0–36.0)
MCV: 90.6 fL (ref 80.0–100.0)
Monocytes Absolute: 0.7 10*3/uL (ref 0.1–1.0)
Monocytes Relative: 9 %
Neutro Abs: 5.5 10*3/uL (ref 1.7–7.7)
Neutrophils Relative %: 69 %
Platelets: 424 10*3/uL — ABNORMAL HIGH (ref 150–400)
RBC: 3.92 MIL/uL — ABNORMAL LOW (ref 4.22–5.81)
RDW: 13 % (ref 11.5–15.5)
WBC: 8 10*3/uL (ref 4.0–10.5)
nRBC: 0 % (ref 0.0–0.2)

## 2019-04-08 LAB — URINALYSIS, ROUTINE W REFLEX MICROSCOPIC
Bilirubin Urine: NEGATIVE
Glucose, UA: NEGATIVE mg/dL
Ketones, ur: NEGATIVE mg/dL
Nitrite: POSITIVE — AB
Protein, ur: NEGATIVE mg/dL
Specific Gravity, Urine: 1.003 — ABNORMAL LOW (ref 1.005–1.030)
pH: 6 (ref 5.0–8.0)

## 2019-04-08 LAB — COMPREHENSIVE METABOLIC PANEL
ALT: 12 U/L (ref 0–44)
AST: 28 U/L (ref 15–41)
Albumin: 3.1 g/dL — ABNORMAL LOW (ref 3.5–5.0)
Alkaline Phosphatase: 84 U/L (ref 38–126)
Anion gap: 11 (ref 5–15)
BUN: 9 mg/dL (ref 8–23)
CO2: 25 mmol/L (ref 22–32)
Calcium: 9.2 mg/dL (ref 8.9–10.3)
Chloride: 99 mmol/L (ref 98–111)
Creatinine, Ser: 0.96 mg/dL (ref 0.61–1.24)
GFR calc Af Amer: >60 mL/min (ref >60)
GFR calc non Af Amer: >60 mL/min (ref >60)
Glucose, Bld: 120 mg/dL — ABNORMAL HIGH (ref 70–99)
Potassium: 3.8 mmol/L (ref 3.5–5.1)
Sodium: 135 mmol/L (ref 135–145)
Total Bilirubin: 0.9 mg/dL (ref 0.3–1.2)
Total Protein: 8.1 g/dL (ref 6.5–8.1)

## 2019-04-08 MED ORDER — DIAZEPAM 5 MG PO TABS: 5.0000 mg | ORAL_TABLET | Freq: Three times a day (TID) | ORAL | 0 refills | Status: DC | PRN

## 2019-04-08 MED ORDER — DIAZEPAM 5 MG PO TABS
5.0000 mg | ORAL_TABLET | Freq: Once | ORAL | Status: AC
  Administered 2019-04-08: 5 mg via ORAL
  Filled 2019-04-08: qty 1

## 2019-04-08 NOTE — ED Provider Notes (Signed)
Emergency Department Provider Note   I have reviewed the triage vital signs and the nursing notes.   HISTORY  Chief Complaint No chief complaint on file.   HPI Andre Glass is a 69 y.o. male with PMH of recently diagnosed prostate cancer with urinary retention and indwelling foley catheter presents to the emergency department for evaluation of intermittent, severe lower abdominal pain.  Patient tells me he has had intermittent discomfort since placement of the Foley catheter in late November. He notes intermittent blood in the urine as well which has increased over the last several days.  He called his urologist on-call several days ago and was prescribed a medication for bladder spasm but tells me this is not helping. He has been dealing with constipation symptoms as well and has not taken his hydrocodone for the past 2 doses to help him have a bowel movement.  His last BM was yesterday evening. No fever/chills. No upper abdominal pain. No fever.  Despite the intermittent discomfort the patient continues to have output into his Foley bag which is regular and steady.    Past Medical History:  Diagnosis Date  . Cancer ([place])     There are no problems to display for this patient.  Allergies Ibuprofen and Shrimp [shellfish allergy]  No family history on file.  Social History Social History   Tobacco Use  . Smoking status: Not on file  Substance Use Topics  . Alcohol use: Never  . Drug use: Never    Review of Systems  Constitutional: No fever/chills Eyes: No visual changes. ENT: No sore throat. Cardiovascular: Denies chest pain. Respiratory: Denies shortness of breath. Gastrointestinal: Intermittent lower abdominal pain.  No nausea, no vomiting.  No diarrhea.  No constipation. Genitourinary: Positive hematuria. No urine retention.  Musculoskeletal: Negative for back pain. Skin: Negative for rash. Neurological: Negative for headaches, focal weakness or  numbness.  10-point ROS otherwise negative.  ____________________________________________   PHYSICAL EXAM:  VITAL SIGNS: ED Triage Vitals  Enc Vitals Group     BP 04/08/19 0800 (!) 193/108     Pulse Rate 04/08/19 0800 93     Resp 04/08/19 0800 16     Temp 04/08/19 0800 98.5 F (36.9 C)     Temp Source 04/08/19 0800 Oral     SpO2 04/08/19 0737 (P) 98 %   Constitutional: Alert and oriented. Well appearing and in no acute distress. Eyes: Conjunctivae are normal.  Head: Atraumatic. Nose: No congestion/rhinnorhea. Mouth/Throat: Mucous membranes are moist.   Neck: No stridor.  Cardiovascular: Normal rate, regular rhythm. Good peripheral circulation. Grossly normal heart sounds.   Respiratory: Normal respiratory effort.  No retractions. Lungs CTAB. Gastrointestinal: Soft and nontender. No distention.  Genitourinary: Foley in place with thin, red blood in the leg bag. No visible clots.  Musculoskeletal: No gross deformities of extremities. Neurologic:  Normal speech and language.  Skin:  Skin is warm, dry and intact. No rash noted.  ____________________________________________   LABS (all labs ordered are listed, but only abnormal results are displayed)  Labs Reviewed  COMPREHENSIVE METABOLIC PANEL - Abnormal; Notable for the following components:      Result Value   Glucose, Bld 120 (*)    Albumin 3.1 (*)    All other components within normal limits  CBC WITH DIFFERENTIAL/PLATELET - Abnormal; Notable for the following components:   RBC 3.92 (*)    Hemoglobin 11.3 (*)    HCT 35.5 (*)    Platelets 424 (*)  All other components within normal limits  URINALYSIS, ROUTINE W REFLEX MICROSCOPIC - Abnormal; Notable for the following components:   Color, Urine AMBER (*)    Specific Gravity, Urine 1.003 (*)    Hgb urine dipstick LARGE (*)    Nitrite POSITIVE (*)    Leukocytes,Ua TRACE (*)    Bacteria, UA RARE (*)    All other components within normal limits  URINE CULTURE    ____________________________________________  RADIOLOGY  CT Renal Stone Study  Result Date: 04/08/2019 CLINICAL DATA:  Prostate cancer with worsening lower abdominal pain. Hematuria. EXAM: CT ABDOMEN AND PELVIS WITHOUT CONTRAST TECHNIQUE: Multidetector CT imaging of the abdomen and pelvis was performed following the standard protocol without IV contrast. COMPARISON:  CT abdomen pelvis-02/26/2019; 02/20/2019 FINDINGS: The lack of intravenous contrast limits the ability to evaluate solid abdominal organs. Lower chest: Limited visualization the lower thorax is negative for focal airspace opacity or pleural effusion. Hepatobiliary: Normal hepatic contour. Normal noncontrast appearance of the gallbladder given degree distention. No radiopaque gallstones. No ascites. Pancreas: Normal noncontrast appearance of the pancreas. Spleen: Normal contrast appearance of the spleen. Note is made of a small splenule. Adrenals/Urinary Tract: Normal noncontrast appearance of the bilateral kidneys. No renal stones. No renal stones are seen along the expected course of either ureter or the urinary bladder. The urinary bladder is decompressed with a Foley catheter. There is grossly unchanged diffuse thickening the urinary bladder wall presumably secondary to chronic bladder outlet obstruction. Normal noncontrast appearance of the bilateral adrenal glands. Stomach/Bowel: Scattered minimal colonic diverticulosis without evidence of superimposed acute diverticulitis. Moderate colonic stool burden without evidence of enteric obstruction. Normal noncontrast appearance of the terminal ileum and the retrocecal appendix. No hiatal hernia. No pneumoperitoneum, pneumatosis or portal venous gas. Vascular/Lymphatic: Scattered atherosclerotic plaque within normal caliber abdominal aorta. Redemonstrated extensive bulky retroperitoneal and pelvic lymphadenopathy with index left sided periaortic nodal conglomeration measuring at least 5.3 x 3.1  cm (image 35, series 2, index left external iliac chain lymph node measuring 1.9 cm in greatest short axis diameter (image 69, series 2), index right pelvic sidewall nodal conglomeration measuring approximately 2.2 cm in greatest short axis diameter (image 74) and left-sided perirectal lymph node measuring 1.4 cm (image 74, series 2). There is unchanged stranding about the retroperitoneum and bilateral pelvic sidewalls without definable/drainable fluid collection. Reproductive: Redemonstrated prostatomegaly. Small amount of fluid in the pelvic cul-de-sac. Other: Regional soft tissues appear normal. Musculoskeletal: No acute osseous abnormalities. Redemonstrated osseous metastatic disease including sclerotic lesion involving the majority of the T12 vertebral body (sagittal image 105, series 5) an approximately 1.1 cm sclerotic lesion involving the inferior endplate of the L2 vertebral body (sagittal image 103, series 5) and an approximately 2.9 x 1.2 cm sclerotic lesion involving the posterior aspect of the left ninth rib (coronal image 122, series 4). IMPRESSION: 1. No interval change to explain the patient's persistent and worsening lower abdominal pain. Specifically, no evidence of nephrolithiasis, enteric or urinary obstruction. Normal noncontrast appearance of the appendix. 2. Urinary bladder is decompressed with a Foley catheter with residual bladder wall thickening presumably secondary to chronic bladder outlet obstructive symptoms in the setting of prostatomegaly/prostate cancer. 3. Redemonstrated findings compatible provided history of metastatic prostate cancer including bulky retroperitoneal, pelvic and perirectal adenopathy and osseous metastatic disease, similar to recent prior examinations. 4.  Aortic Atherosclerosis (ICD10-I70.0). Electronically Signed   By: Sandi Mariscal M.D.   On: 04/08/2019 09:18    ____________________________________________   PROCEDURES  Procedure(s) performed:  Procedures  None  ____________________________________________   INITIAL IMPRESSION / ASSESSMENT AND PLAN / ED COURSE  Pertinent labs & imaging results that were available during my care of the patient were reviewed by me and considered in my medical decision making (see chart for details).   Patient presents to the emergency department for evaluation of intermittent pain in the lower abdomen thought to be bladder spasms.  Patient is on oxybutynin without relief.  Mild diffuse tenderness in the lower abdomen without peritoneal findings.  Patient is not retaining clinically. Plan for repeat CT renal scan to evaluate for progression of disease +/- hydronephrosis. Patient interested in having the Foley catheter removed.  I discussed that although it may provide temporary relief I am concerned that he would develop urinary retention again and that replacing the foley could be challenging leading to more pain, potential injury, and risk suprapubic foley placement.   09:33 AM  No acute findings on CT to explain symptoms. Bladder is decompressed. [place] is nitrite positive but rare bacterial. No fever, leukocytosis, etc to highly suspect acute UTI. Will send for Cx. Patient's last UA grew Morganella. Question if Hb is affecting the Nitrite positive lab value. Plan to try Valium at home for bladder spasms. Cautioned to not take this with Hydrocodone. Will call his Urologist on Monday.  ____________________________________________  FINAL CLINICAL IMPRESSION(S) / ED DIAGNOSES  Final diagnoses:  Lower abdominal pain  Gross hematuria     MEDICATIONS GIVEN DURING THIS VISIT:  Medications  diazepam (VALIUM) tablet 5 mg (5 mg Oral Given 04/08/19 0833)     NEW OUTPATIENT MEDICATIONS STARTED DURING THIS VISIT:  New Prescriptions   DIAZEPAM (VALIUM) 5 MG TABLET    Take 1 tablet (5 mg total) by mouth every 8 (eight) hours as needed for muscle spasms.    Note:  This document was prepared using Dragon  voice recognition software and may include unintentional dictation errors.  Nanda Quinton, MD, [facility]    Amesha Bailey, Wonda Olds, MD 04/08/19 [phone]

## 2019-04-08 NOTE — Discharge Instructions (Signed)
You were seen in the emergency department today with lower abdominal pain.  I am sending your urine test for culture.  If you develop fever, worsening pain, confusion, or other severe symptoms he should return to the emergency department as this could be sign of infection.  Your CT scan did not show any new finding in your abdomen.  Please call your urologist on Monday to schedule the next available follow-up appointment.

## 2019-04-10 ENCOUNTER — Encounter (HOSPITAL_COMMUNITY)
Admission: RE | Admit: 2019-04-10 | Discharge: 2019-04-10 | Disposition: A | Discharge status: Home / Self Care | Payer: Medicaid Other | Source: Ambulatory Visit | Attending: Oncology | Admitting: Oncology

## 2019-04-10 ENCOUNTER — Ambulatory Visit (HOSPITAL_COMMUNITY)
Admission: RE | Admit: 2019-04-10 | Discharge: 2019-04-10 | Disposition: A | Discharge status: Home / Self Care | Payer: Medicaid Other | Source: Ambulatory Visit | Attending: Oncology | Admitting: Oncology

## 2019-04-10 ENCOUNTER — Other Ambulatory Visit: Payer: Self-pay

## 2019-04-10 DIAGNOSIS — C61 Malignant neoplasm of prostate: Secondary | ICD-10-CM | POA: Insufficient documentation

## 2019-04-10 DIAGNOSIS — C7951 Secondary malignant neoplasm of bone: Secondary | ICD-10-CM | POA: Diagnosis not present

## 2019-04-10 LAB — URINE CULTURE: Culture: 50000 — AB

## 2019-04-10 MED ORDER — TECHNETIUM TC 99M MEDRONATE IV KIT
20.0000 | PACK | Freq: Once | INTRAVENOUS | Status: AC | PRN
  Administered 2019-04-10: 20 via INTRAVENOUS

## 2019-04-11 ENCOUNTER — Other Ambulatory Visit: Payer: Self-pay

## 2019-04-11 ENCOUNTER — Inpatient Hospital Stay (HOSPITAL_BASED_OUTPATIENT_CLINIC_OR_DEPARTMENT_OTHER): Payer: Medicaid Other | Admitting: Oncology

## 2019-04-11 ENCOUNTER — Inpatient Hospital Stay: Payer: Medicaid Other | Attending: Oncology

## 2019-04-11 VITALS — BP 154/98 | HR 86 | Temp 98.3°F | Resp 17 | Ht 70.0 in | Wt 210.0 lb

## 2019-04-11 DIAGNOSIS — M898X9 Other specified disorders of bone, unspecified site: Secondary | ICD-10-CM | POA: Diagnosis not present

## 2019-04-11 DIAGNOSIS — Z79899 Other long term (current) drug therapy: Secondary | ICD-10-CM | POA: Diagnosis not present

## 2019-04-11 DIAGNOSIS — Z923 Personal history of irradiation: Secondary | ICD-10-CM | POA: Insufficient documentation

## 2019-04-11 DIAGNOSIS — M25552 Pain in left hip: Secondary | ICD-10-CM | POA: Diagnosis not present

## 2019-04-11 DIAGNOSIS — C61 Malignant neoplasm of prostate: Secondary | ICD-10-CM | POA: Diagnosis not present

## 2019-04-11 DIAGNOSIS — R339 Retention of urine, unspecified: Secondary | ICD-10-CM | POA: Diagnosis not present

## 2019-04-11 DIAGNOSIS — R634 Abnormal weight loss: Secondary | ICD-10-CM | POA: Diagnosis not present

## 2019-04-11 DIAGNOSIS — M545 Low back pain: Secondary | ICD-10-CM | POA: Insufficient documentation

## 2019-04-11 LAB — CBC WITH DIFFERENTIAL (CANCER CENTER ONLY)
Abs Immature Granulocytes: 0.03 10*3/uL (ref 0.00–0.07)
Basophils Absolute: 0.1 10*3/uL (ref 0.0–0.1)
Basophils Relative: 1 %
Eosinophils Absolute: 0.2 10*3/uL (ref 0.0–0.5)
Eosinophils Relative: 2 %
HCT: 34.9 % — ABNORMAL LOW (ref 39.0–52.0)
Hemoglobin: 11.5 g/dL — ABNORMAL LOW (ref 13.0–17.0)
Immature Granulocytes: 0 %
Lymphocytes Relative: 21 %
Lymphs Abs: 2.1 10*3/uL (ref 0.7–4.0)
MCH: 29.6 pg (ref 26.0–34.0)
MCHC: 33 g/dL (ref 30.0–36.0)
MCV: 89.7 fL (ref 80.0–100.0)
Monocytes Absolute: 0.8 10*3/uL (ref 0.1–1.0)
Monocytes Relative: 8 %
Neutro Abs: 7 10*3/uL (ref 1.7–7.7)
Neutrophils Relative %: 68 %
Platelet Count: 420 10*3/uL — ABNORMAL HIGH (ref 150–400)
RBC: 3.89 MIL/uL — ABNORMAL LOW (ref 4.22–5.81)
RDW: 13.1 % (ref 11.5–15.5)
WBC Count: 10.2 10*3/uL (ref 4.0–10.5)
nRBC: 0 % (ref 0.0–0.2)

## 2019-04-11 LAB — CMP (CANCER CENTER ONLY)
ALT: 13 U/L (ref 0–44)
AST: 18 U/L (ref 15–41)
Albumin: 2.9 g/dL — ABNORMAL LOW (ref 3.5–5.0)
Alkaline Phosphatase: 85 U/L (ref 38–126)
Anion gap: 12 (ref 5–15)
BUN: 9 mg/dL (ref 8–23)
CO2: 28 mmol/L (ref 22–32)
Calcium: 9.6 mg/dL (ref 8.9–10.3)
Chloride: 100 mmol/L (ref 98–111)
Creatinine: 1.01 mg/dL (ref 0.61–1.24)
GFR, Est AFR Am: >60 mL/min (ref >60)
GFR, Estimated: >60 mL/min (ref >60)
Glucose, Bld: 109 mg/dL — ABNORMAL HIGH (ref 70–99)
Potassium: 3.2 mmol/L — ABNORMAL LOW (ref 3.5–5.1)
Sodium: 140 mmol/L (ref 135–145)
Total Bilirubin: 0.5 mg/dL (ref 0.3–1.2)
Total Protein: 8.4 g/dL — ABNORMAL HIGH (ref 6.5–8.1)

## 2019-04-11 NOTE — Progress Notes (Signed)
ED Antimicrobial Stewardship Positive Culture Follow Up   Andre Glass is an 69 y.o. male who presented to [facility] on 04/08/2019 with a chief complaint of intermittent, severe lower abdominal pain. No fever/chills or leukocytosis. No acute findings on CT scan.    Recent Results (from the past 720 hour(s))  Urine culture     Status: Abnormal   Collection Time: 04/08/19  8:27 AM   Specimen: Urine, Clean Catch  Result Value Ref Range Status   Specimen Description   Final    URINE, CLEAN CATCH Performed at [facility], [place] [address], [place], [place] [address]    Special Requests   Final    NONE Performed at [facility], [place] [address] [place], [place] [address]    Culture (A)  Final    50,000 COLONIES/mL ESCHERICHIA COLI Two isolates with different morphologies were identified as the same organism.The most resistant organism was reported. Performed at [facility], [place] [address], [place], [place] [address]    Report Status 04/10/2019 FINAL  Final   Organism ID, Bacteria ESCHERICHIA COLI (A)  Final      Susceptibility   Escherichia coli - MIC*    AMPICILLIN >=32 RESISTANT Resistant     CEFAZOLIN <=4 SENSITIVE Sensitive     CEFTRIAXONE <=0.25 SENSITIVE Sensitive     CIPROFLOXACIN >=4 RESISTANT Resistant     GENTAMICIN <=1 SENSITIVE Sensitive     IMIPENEM <=0.25 SENSITIVE Sensitive     NITROFURANTOIN <=16 SENSITIVE Sensitive     TRIMETH/SULFA <=20 SENSITIVE Sensitive     AMPICILLIN/SULBACTAM 16 INTERMEDIATE Intermediate     PIP/TAZO <=4 SENSITIVE Sensitive     * 50,000 COLONIES/mL ESCHERICHIA COLI    PLAN: Fax results to [facility] to have them assess if patient needs any further treatment.   ED Provider: Dr. Orville Govern M 04/11/2019, 4:35 PM Clinical Pharmacist [phone]

## 2019-04-11 NOTE — Progress Notes (Signed)
Hematology and Oncology Follow Up Visit  Andre Glass [id number] 10-13-1950 69 y.o. 04/11/2019 3:11 PM Patient, No Pcp PerBell, [name], MD   Principle Diagnosis: 69 year old with advanced prostate cancer with lymphadenopathy and bone disease diagnosed in December 2020.  He was found to have Gleason score 4+5 = 9 and a PSA of 366.   Prior Therapy: He is status post prostate biopsy completed on December 3 of 2020.  Pathology showed a Gleason score 4+5 = 9 with 12 out of 12 cores.   Current therapy:  Under consideration to start therapy  Interim History: [name] presents today for a follow-up visit.  Since the last visit, he was seen in the emergency department on April 08, 2019 with complaints of abdominal pain.  He had a CT scan of the abdomen and pelvis that did not show any acute pathology but did show pelvic adenopathy.  He continues to report left sided hip discomfort for which he takes hydrocodone that is managing his pain.  He denies any recent falls or syncope.  He does report weight loss and decline in his appetite.  He has reported decline in his overall performance status and energy.     Medications: I have reviewed the patient's current medications.  Current Outpatient Medications  Medication Sig Dispense Refill  . acetaminophen (TYLENOL) 325 MG tablet Take 650 mg by mouth every 6 (six) hours as needed for mild pain.    . cephALEXin (KEFLEX) 500 MG capsule Take 1 capsule (500 mg total) by mouth 3 (three) times daily. (Patient not taking: Reported on 03/21/2019) 21 capsule 0  . diazepam (VALIUM) 5 MG tablet Take 1 tablet (5 mg total) by mouth every 8 (eight) hours as needed for muscle spasms. 10 tablet 0  . docusate sodium (COLACE) 100 MG capsule Take 100 mg by mouth daily as needed for mild constipation or moderate constipation.    Marland Kitchen HYDROcodone-acetaminophen (NORCO) 5-325 MG tablet Take 1 tablet by mouth every 6 (six) hours as needed for moderate pain. 50 tablet 0  .  oxybutynin (DITROPAN) 5 MG tablet Take 1 tablet (5 mg total) by mouth 3 (three) times daily as needed for up to 30 doses for bladder spasms. 90 tablet 0  . Pseudoephedrine HCl (SUDAFED) 30 MG/5ML LIQD Take 30 mg by mouth 4 (four) times daily as needed for congestion.    . senna (SENOKOT) 8.6 MG TABS tablet Take 1 tablet by mouth daily as needed for mild constipation.    . tamsulosin (FLOMAX) 0.4 MG CAPS capsule Take 1 capsule (0.4 mg total) by mouth daily. (Patient not taking: Reported on 04/08/2019) 30 capsule 0   No current facility-administered medications for this visit.     Allergies:  Allergies  Allergen Reactions  . Ibuprofen Anaphylaxis  . Shrimp [Shellfish Allergy] Anaphylaxis    Past Medical History, Surgical history, Social history, and Family History were reviewed and updated.   Physical Exam: Blood pressure (!) 154/98, pulse 86, temperature 98.3 F (36.8 C), temperature source Temporal, resp. rate 17, height 5\' 10"  (1.778 m), weight 210 lb (95.3 kg), SpO2 100 %.   ECOG:     General appearance: Comfortable appearing without any discomfort Head: Normocephalic without any trauma Oropharynx: Mucous membranes are moist and pink without any thrush or ulcers. Eyes: Pupils are equal and round reactive to light. Lymph nodes: No cervical, supraclavicular, inguinal or axillary lymphadenopathy.   Heart:regular rate and rhythm.  S1 and S2 without leg edema. Lung: Clear without  any rhonchi or wheezes.  No dullness to percussion. Abdomin: Soft, nontender, nondistended with good bowel sounds.  No hepatosplenomegaly. Musculoskeletal: No joint deformity or effusion.  Full range of motion noted. Neurological: No deficits noted on motor, sensory and deep tendon reflex exam. Skin: No petechial rash or dryness.  Appeared moist.        Lab Results: Lab Results  Component Value Date   WBC 8.0 04/08/2019   HGB 11.3 (L) 04/08/2019   HCT 35.5 (L) 04/08/2019   MCV 90.6 04/08/2019    PLT 424 (H) 04/08/2019     Chemistry      Component Value Date/Time   NA 135 04/08/2019 0827   K 3.8 04/08/2019 0827   CL 99 04/08/2019 0827   CO2 25 04/08/2019 0827   BUN 9 04/08/2019 0827   CREATININE 0.96 04/08/2019 0827      Component Value Date/Time   CALCIUM 9.2 04/08/2019 0827   ALKPHOS 84 04/08/2019 0827   AST 28 04/08/2019 0827   ALT 12 04/08/2019 0827   BILITOT 0.9 04/08/2019 0827       Radiological Studies: NM Bone Scan Whole Body  Result Date: 04/10/2019 CLINICAL DATA:  Prostate cancer.  Low back pain.  No known injury. EXAM: NUCLEAR MEDICINE WHOLE BODY BONE SCAN TECHNIQUE: Whole body anterior and posterior images were obtained approximately 3 hours after intravenous injection of radiopharmaceutical. RADIOPHARMACEUTICALS:  21.0 mCi Technetium-43m MDP IV COMPARISON:  Abdominopelvic CT 04/08/2019 FINDINGS: There is focally increased activity within the T12 vertebral body, off midline to the left, corresponding with a sclerotic metastasis on CT. Focally increased activity is also present within the T10 vertebral body, sacrum and both ischia. No other definite abnormal osseous uptake. The soft tissue activity is within normal limits. Foley catheter is in place. No hydronephrosis. IMPRESSION: 1. Multifocal uptake within sclerotic osseous metastases within the lower thoracic spine and pelvis as described. 2. No hydronephrosis. Electronically Signed   By: [name] M.D.   On: 04/10/2019 16:11     Impression and Plan:   69 year old man with:  1.    Advanced prostate cancer is currently castration-sensitive with disease to the bone and lymphadenopathy.    The natural course of this disease was reviewed today with the patient and his friend and accompanied him.  He understands he has an incurable malignancy but disease that Andre be palliated.  The backbone of treating his prostate cancer is androgen deprivation therapy.  Therapy escalation was discussed today using Xtandi  or Zytiga as well as systemic chemotherapy.  He would be a reasonable candidate for Zytiga after starting androgen deprivation.  2.  Androgen deprivation therapy: He is scheduled to see Dr. [name] in the near future.  I urged him to consider orchiectomy or androgen deprivation but make his decision soon.  3.  Bone directed therapy: I recommended calcium and vitamin D supplements with consideration for Xgeva after obtaining dental clearance.  4.  Bone pain: Manageable at this time with hydrocodone.  I anticipate improvement after starting androgen deprivation therapy.  Palliative radiation therapy Andre be considered at this time if pain continues to be an issue.    5.  Urinary retention: Foley catheter remains in place and will follow up with Dr. [name] regarding this issue.  6.  Follow-up: Will be in the next 4 to 6 weeks to follow his progress.   25  minutes was spent during this encounter.  Time was dedicated to reviewing his disease status, treatment options and  complications related to his cancer and chemotherapy.   Zola Button, MD 1/5/20213:11 PM

## 2019-04-12 ENCOUNTER — Telehealth: Payer: Self-pay | Admitting: Oncology

## 2019-04-12 DIAGNOSIS — C61 Malignant neoplasm of prostate: Secondary | ICD-10-CM | POA: Diagnosis not present

## 2019-04-12 DIAGNOSIS — R338 Other retention of urine: Secondary | ICD-10-CM | POA: Diagnosis not present

## 2019-04-12 DIAGNOSIS — C775 Secondary and unspecified malignant neoplasm of intrapelvic lymph nodes: Secondary | ICD-10-CM | POA: Diagnosis not present

## 2019-04-12 DIAGNOSIS — C7951 Secondary malignant neoplasm of bone: Secondary | ICD-10-CM | POA: Diagnosis not present

## 2019-04-12 LAB — PROSTATE-SPECIFIC AG, SERUM (LABCORP): Prostate Specific Ag, Serum: 632 ng/mL — ABNORMAL HIGH (ref 0.0–4.0)

## 2019-04-12 NOTE — Telephone Encounter (Signed)
Scheduled appt per 1/5 los, sent a message to HIM pool to get a calendar mailed out. 

## 2019-04-13 ENCOUNTER — Encounter: Payer: Self-pay | Admitting: *Deleted

## 2019-04-17 NOTE — Progress Notes (Addendum)
Histology and Location of Primary Cancer: advanced prostate cancer with lymphadenopathy and bone disease diagnosed in December 2020.  He was found to have Gleason score 5+4 = 9 and a PSA of 366.  Sites of Visceral and Bony Metastatic Disease: lymphadenopathy and bone disease   Location(s) of Symptomatic Metastases: left pelvis, right pelvis and low back.  Past/Anticipated chemotherapy by medical oncology, if any: under consideration for therapy  Pain on a scale of 0-10 is: Presently patient reports pain a 5 on a scale of 0-10 because he recently took [place]. However, patient reports his norco doesn't last the six hours its supposed to so after four hours of having taken it he is screaming in pain 10 on a scale of 0-10.    If Spine Met(s), symptoms, if any, include:  Bowel/Bladder retention or incontinence (please describe): Prescribed oxybutynin. Reports oxybutynin helps relieve the bladder spasms but only for 4-6 hours not 8. Confirms he has a foley in place. Reports the foley is draining without complication but occasionally he notes blood in the collection bag.  Numbness or weakness in extremities (please describe): Denies numbness or tingling of lower extremities.  Current Decadron regimen, if applicable: no  Ambulatory status? Walker? Wheelchair?: Ambulatory with Assistance. Reports using a cane to steady himself sometimes. Explains the pain medication makes him wobbly.   SAFETY ISSUES:  Prior radiation? denies  Pacemaker/ICD? denies  Possible current pregnancy? no, male patient  Is the patient on methotrexate? no  Current Complaints / other details:  69 year old male. Single. No children. Scheduled to follow up with Dr. [name] again in February.

## 2019-04-18 ENCOUNTER — Ambulatory Visit
Admission: RE | Admit: 2019-04-18 | Discharge: 2019-04-18 | Disposition: A | Discharge status: Home / Self Care | Payer: Medicaid Other | Source: Ambulatory Visit | Attending: Radiation Oncology | Admitting: Radiation Oncology

## 2019-04-18 ENCOUNTER — Other Ambulatory Visit: Payer: Self-pay

## 2019-04-18 ENCOUNTER — Encounter: Payer: Self-pay | Admitting: Radiation Oncology

## 2019-04-18 ENCOUNTER — Ambulatory Visit: Payer: Medicaid Other

## 2019-04-18 ENCOUNTER — Ambulatory Visit: Payer: Medicaid Other | Admitting: Radiation Oncology

## 2019-04-18 VITALS — Ht 68.0 in | Wt 210.0 lb

## 2019-04-18 DIAGNOSIS — R9721 Rising PSA following treatment for malignant neoplasm of prostate: Secondary | ICD-10-CM | POA: Diagnosis not present

## 2019-04-18 DIAGNOSIS — G893 Neoplasm related pain (acute) (chronic): Secondary | ICD-10-CM | POA: Diagnosis not present

## 2019-04-18 DIAGNOSIS — Z79899 Other long term (current) drug therapy: Secondary | ICD-10-CM | POA: Diagnosis not present

## 2019-04-18 DIAGNOSIS — C7951 Secondary malignant neoplasm of bone: Secondary | ICD-10-CM

## 2019-04-18 DIAGNOSIS — C61 Malignant neoplasm of prostate: Secondary | ICD-10-CM | POA: Diagnosis not present

## 2019-04-18 DIAGNOSIS — C7952 Secondary malignant neoplasm of bone marrow: Secondary | ICD-10-CM

## 2019-04-18 HISTORY — DX: Malignant neoplasm of prostate: C61

## 2019-04-18 NOTE — Progress Notes (Signed)
Radiation Oncology         (336) [phone] ________________________________  Initial outpatient Consultation - Conducted via MyChart due to current COVID-19 concerns for limiting patient exposure  Name: Andre Glass MRN: [id number]  Date: 04/18/2019  DOB: Jan 12, 1951  XA:JOINOMV, No Pcp Per  Davis Gourd*   REFERRING PHYSICIAN: Davis Gourd*  DIAGNOSIS: 69 y.o. gentleman with advanced, metastatic prostate cancer with lymphadenopathy and bone disease, with Gleason score of 5+4, and PSA of 632.    ICD-10-CM   1. Secondary malignant neoplasm of bone and bone marrow (HCC)  C79.51    C79.52     HISTORY OF PRESENT ILLNESS: Andre Glass is a 69 y.o. male with a diagnosis of prostate cancer. He presented to the ED on 02/20/2019 with acute urinary retention and constipation. CT A/P performed on admission showed a markedly enlarged heterogeneous prostate gland causing mass effect on the urinary bladder base, measuring approximately 140 g with multifocal pelvic and retroperitoneal lymphadenopathy and nonspecific multiple sclerotic foci within the spine and pelvis with confluent sclerosis within T12 most suspicious for sclerotic metastatic disease. A foley catheter was placed at that time and he was started on Flomax.  Accordingly, he was referred for outpatient evaluation in urology by Dr. [name] on 03/08/2019,  digital rectal examination was performed at that time revealing a massively enlarged prostate that was very firm to palpation, especially on the right.  The patient proceeded to transrectal ultrasound with 12 biopsies of the prostate on 03/09/2019.  The prostate volume measured 180 cc.  Out of 12 core biopsies, all 12 were positive.  The maximum Gleason score was 5+4, and this was seen in left base. Gleason 4+5 was seen in LBL, LML, LM, RM, RA, RBL, RML, and RAL. Gleason 4+4 was seen in left apex, right base, and left apex lateral.  A PSA performed that day revealed significant  elevation at 366.  He was also referred to Dr. [name] on 03/21/2019, who recommended a form of androgen deprivation therapy followed by [facility] for treatment escalation. He remains undecided between bilateral orichetcomy or chemical castration but plans to reach a decision by the end of this month and move forward with treatment at that time.  His most recent PSA had doubled, at 632 on 04/11/19 as compared to 366 on 03/10/19.  Recently, he reported worsening low back and left lateral hip pain.  A bone scan on 04/10/2019 revealed focally increased activity within the T12 vertebral body, off midline to the left, corresponding with a sclerotic metastasis on CT as well as focally increased activity within the T10 vertebral body, sacrum and both ischia. No other definite abnormal osseous uptake noted.  The patient reviewed the imaging results with his urologist and oncologist, and he has kindly been referred today for discussion of potential palliative radiation treatment options to help manage his bone pain.   PREVIOUS RADIATION THERAPY: No  PAST MEDICAL HISTORY:  Past Medical History:  Diagnosis Date  . Prostate cancer ([place])       PAST SURGICAL HISTORY:History reviewed. No pertinent surgical history.  FAMILY HISTORY:  Family History  Problem Relation Age of Onset  . Stomach cancer Mother   . Pancreatic cancer Sister   . Breast cancer Neg Hx   . Prostate cancer Neg Hx   . Colon cancer Neg Hx     SOCIAL HISTORY:  Social History   Socioeconomic History  . Marital status: Single    Spouse name: Not on file  .  Number of children: 0  . Years of education: Not on file  . Highest education level: Not on file  Occupational History  . Not on file  Tobacco Use  . Smoking status: Never Smoker  . Smokeless tobacco: Never Used  Substance and Sexual Activity  . Alcohol use: Never  . Drug use: Never  . Sexual activity: Not Currently  Other Topics Concern  . Not on file  Social History  Narrative  . Not on file   Social Determinants of Health   Financial Resource Strain:   . Difficulty of Paying Living Expenses: Not on file  Food Insecurity:   . Worried About Charity fundraiser in the Last Year: Not on file  . Ran Out of Food in the Last Year: Not on file  Transportation Needs:   . Lack of Transportation (Medical): Not on file  . Lack of Transportation (Non-Medical): Not on file  Physical Activity:   . Days of Exercise per Week: Not on file  . Minutes of Exercise per Session: Not on file  Stress:   . Feeling of Stress : Not on file  Social Connections:   . Frequency of Communication with Friends and Family: Not on file  . Frequency of Social Gatherings with Friends and Family: Not on file  . Attends Religious Services: Not on file  . Active Member of Clubs or Organizations: Not on file  . Attends Archivist Meetings: Not on file  . Marital Status: Not on file  Intimate Partner Violence:   . Fear of Current or Ex-Partner: Not on file  . Emotionally Abused: Not on file  . Physically Abused: Not on file  . Sexually Abused: Not on file    ALLERGIES: Ibuprofen and Shrimp [shellfish allergy]  MEDICATIONS:  Current Outpatient Medications  Medication Sig Dispense Refill  . acetaminophen (TYLENOL) 325 MG tablet Take 650 mg by mouth every 6 (six) hours as needed for mild pain.    Marland Kitchen docusate sodium (COLACE) 100 MG capsule Take 100 mg by mouth daily as needed for mild constipation or moderate constipation.    Marland Kitchen HYDROcodone-acetaminophen (NORCO) 5-325 MG tablet Take 1 tablet by mouth every 6 (six) hours as needed for moderate pain. 50 tablet 0  . oxybutynin (DITROPAN) 5 MG tablet Take 1 tablet (5 mg total) by mouth 3 (three) times daily as needed for up to 30 doses for bladder spasms. 90 tablet 0  . senna (SENOKOT) 8.6 MG TABS tablet Take 1 tablet by mouth daily as needed for mild constipation.    . Pseudoephedrine HCl (SUDAFED) 30 MG/5ML LIQD Take 30 mg by  mouth 4 (four) times daily as needed for congestion.    . tamsulosin (FLOMAX) 0.4 MG CAPS capsule Take 1 capsule (0.4 mg total) by mouth daily. (Patient not taking: Reported on 04/08/2019) 30 capsule 0   No current facility-administered medications for this encounter.    REVIEW OF SYSTEMS:  On review of systems, the patient reports that he is doing well overall. He denies any chest pain, shortness of breath, cough, fevers, chills, night sweats, unintended weight changes. He denies any bowel disturbances, and denies abdominal pain, nausea or vomiting. He is currently taking senokot and colace to manage his constipation. He reports bilateral hip pain that is mainly on the left lateral hip, specifically to the side and back. He notes the pain goes down to both knees but he denies any groin pain. He also reports severe low back pain, as  well as occasional gait imbalance. He states the pain medication makes him wobbly. He rates his pain at 10/10, which is brought down to a 5/10 with the Norco he's been prescribed. He notes the Norco does not last the 6 hours it's supposed to, and after 4 hours his pain is back to being an unbearable 10/10. He denies numbness. He also reports bladder spasms, which is partially relieved by oxybutynin.  A complete review of systems is obtained and is otherwise negative.    PHYSICAL EXAM:  Wt Readings from Last 3 Encounters:  04/18/19 210 lb (95.3 kg)  04/11/19 210 lb (95.3 kg)  03/21/19 224 lb (101.6 kg)   Temp Readings from Last 3 Encounters:  04/11/19 98.3 F (36.8 C) (Temporal)  04/08/19 98.5 F (36.9 C) (Oral)  03/21/19 98.3 F (36.8 C) (Temporal)   BP Readings from Last 3 Encounters:  04/11/19 (!) 154/98  04/08/19 (!) 192/115  03/21/19 (!) 161/75   Pulse Readings from Last 3 Encounters:  04/11/19 86  04/08/19 88  03/21/19 77   Pain Assessment Pain Score: 5  Pain Frequency: Constant Pain Loc: Hip/10  In general this is a well appearing African  American gentleman in no acute distress. He's alert and oriented x4 and appropriate throughout the examination. Cardiopulmonary assessment is negative for acute distress and he exhibits normal effort.    KPS = 60  100 - Normal; no complaints; no evidence of disease. 90   - Able to carry on normal activity; minor signs or symptoms of disease. 80   - Normal activity with effort; some signs or symptoms of disease. 67   - Cares for self; unable to carry on normal activity or to do active work. 60   - Requires occasional assistance, but is able to care for most of his personal needs. 50   - Requires considerable assistance and frequent medical care. 52   - Disabled; requires special care and assistance. 30   - Severely disabled; hospital admission is indicated although death not imminent. 29   - Very sick; hospital admission necessary; active supportive treatment necessary. 10   - Moribund; fatal processes progressing rapidly. 0     - Dead  Karnofsky DA, Abelmann Dougherty, Craver LS and Burchenal [facility] [phone]) The use of the nitrogen mustards in the palliative treatment of carcinoma: with particular reference to bronchogenic carcinoma Cancer 1 634-56  LABORATORY DATA:  Lab Results  Component Value Date   WBC 10.2 04/11/2019   HGB 11.5 (L) 04/11/2019   HCT 34.9 (L) 04/11/2019   MCV 89.7 04/11/2019   PLT 420 (H) 04/11/2019   Lab Results  Component Value Date   NA 140 04/11/2019   K 3.2 (L) 04/11/2019   CL 100 04/11/2019   CO2 28 04/11/2019   Lab Results  Component Value Date   ALT 13 04/11/2019   AST 18 04/11/2019   ALKPHOS 85 04/11/2019   BILITOT 0.5 04/11/2019     RADIOGRAPHY: NM Bone Scan Whole Body  Result Date: 04/10/2019 CLINICAL DATA:  Prostate cancer.  Low back pain.  No known injury. EXAM: NUCLEAR MEDICINE WHOLE BODY BONE SCAN TECHNIQUE: Whole body anterior and posterior images were obtained approximately 3 hours after intravenous injection of radiopharmaceutical.  RADIOPHARMACEUTICALS:  21.0 mCi Technetium-67m MDP IV COMPARISON:  Abdominopelvic CT 04/08/2019 FINDINGS: There is focally increased activity within the T12 vertebral body, off midline to the left, corresponding with a sclerotic metastasis on CT. Focally increased activity is also present within the  T10 vertebral body, sacrum and both ischia. No other definite abnormal osseous uptake. The soft tissue activity is within normal limits. Foley catheter is in place. No hydronephrosis. IMPRESSION: 1. Multifocal uptake within sclerotic osseous metastases within the lower thoracic spine and pelvis as described. 2. No hydronephrosis. Electronically Signed   By: [name] M.D.   On: 04/10/2019 16:11   CT Renal Stone Study  Result Date: 04/08/2019 CLINICAL DATA:  Prostate cancer with worsening lower abdominal pain. Hematuria. EXAM: CT ABDOMEN AND PELVIS WITHOUT CONTRAST TECHNIQUE: Multidetector CT imaging of the abdomen and pelvis was performed following the standard protocol without IV contrast. COMPARISON:  CT abdomen pelvis-02/26/2019; 02/20/2019 FINDINGS: The lack of intravenous contrast limits the ability to evaluate solid abdominal organs. Lower chest: Limited visualization the lower thorax is negative for focal airspace opacity or pleural effusion. Hepatobiliary: Normal hepatic contour. Normal noncontrast appearance of the gallbladder given degree distention. No radiopaque gallstones. No ascites. Pancreas: Normal noncontrast appearance of the pancreas. Spleen: Normal contrast appearance of the spleen. Note is made of a small splenule. Adrenals/Urinary Tract: Normal noncontrast appearance of the bilateral kidneys. No renal stones. No renal stones are seen along the expected course of either ureter or the urinary bladder. The urinary bladder is decompressed with a Foley catheter. There is grossly unchanged diffuse thickening the urinary bladder wall presumably secondary to chronic bladder outlet obstruction.  Normal noncontrast appearance of the bilateral adrenal glands. Stomach/Bowel: Scattered minimal colonic diverticulosis without evidence of superimposed acute diverticulitis. Moderate colonic stool burden without evidence of enteric obstruction. Normal noncontrast appearance of the terminal ileum and the retrocecal appendix. No hiatal hernia. No pneumoperitoneum, pneumatosis or portal venous gas. Vascular/Lymphatic: Scattered atherosclerotic plaque within normal caliber abdominal aorta. Redemonstrated extensive bulky retroperitoneal and pelvic lymphadenopathy with index left sided periaortic nodal conglomeration measuring at least 5.3 x 3.1 cm (image 35, series 2, index left external iliac chain lymph node measuring 1.9 cm in greatest short axis diameter (image 69, series 2), index right pelvic sidewall nodal conglomeration measuring approximately 2.2 cm in greatest short axis diameter (image 74) and left-sided perirectal lymph node measuring 1.4 cm (image 74, series 2). There is unchanged stranding about the retroperitoneum and bilateral pelvic sidewalls without definable/drainable fluid collection. Reproductive: Redemonstrated prostatomegaly. Small amount of fluid in the pelvic cul-de-sac. Other: Regional soft tissues appear normal. Musculoskeletal: No acute osseous abnormalities. Redemonstrated osseous metastatic disease including sclerotic lesion involving the majority of the T12 vertebral body (sagittal image 105, series 5) an approximately 1.1 cm sclerotic lesion involving the inferior endplate of the L2 vertebral body (sagittal image 103, series 5) and an approximately 2.9 x 1.2 cm sclerotic lesion involving the posterior aspect of the left ninth rib (coronal image 122, series 4). IMPRESSION: 1. No interval change to explain the patient's persistent and worsening lower abdominal pain. Specifically, no evidence of nephrolithiasis, enteric or urinary obstruction. Normal noncontrast appearance of the appendix. 2.  Urinary bladder is decompressed with a Foley catheter with residual bladder wall thickening presumably secondary to chronic bladder outlet obstructive symptoms in the setting of prostatomegaly/prostate cancer. 3. Redemonstrated findings compatible provided history of metastatic prostate cancer including bulky retroperitoneal, pelvic and perirectal adenopathy and osseous metastatic disease, similar to recent prior examinations. 4.  Aortic Atherosclerosis (ICD10-I70.0). Electronically Signed   By: Sandi Mariscal M.D.   On: 04/08/2019 09:18      IMPRESSION/PLAN: This visit was conducted via MyChart to spare the patient unnecessary potential exposure in the healthcare setting during the current COVID-19  pandemic. 1. 69 y.o. gentleman with advanced metastatic prostate cancer with lymphadenopathy and bone disease, with Gleason score of 5+4, and PSA of 632. Today, we talked to the patient and family about the findings and workup thus far. We discussed the natural history of metastatic prostate cancer and general treatment, highlighting the role of radiotherapy in the management. We discussed the use of palliative radiation therapy to help manage painful bony disease. We reviewed the anticipated acute and late sequelae associated with radiation in this setting. We discussed that this would involve a 10 fraction course of palliative external beam radiation therapy directed to his lower thoracic spine. The patient was encouraged to ask questions that were answered to his satisfaction.  We also encouraged him to reach a decision regarding ADT, sooner than later to slow the progression of his aggressive appearing disease.  At the end of the conversation the patient is interested in moving forward with a 2 week course of daily palliative external beam radiation to the thoracic spine. He is scheduled for CT simulation on 04/19/2019, at 1 pm in anticipation of beginning radiation treatments on Friday, 04/21/2019. We reviewed the  consent form today and he will formally sign written consent at the time of his Simulation/treatment planning appointment on Friday and a copy of this document will be placed in his medical record.  He appears to have a good understanding of his disease and our recommendations for treatment which are of palliative intent and he is in agreement with the stated plan.  We will share this discussion with Dr. [name] and Dr. [name] and proceed accordingly.  Given current concerns for patient exposure during the COVID-19 pandemic, this encounter was conducted via video-enabled MyChart visit. The patient has given verbal consent for this type of encounter. The time spent during this encounter was 60 minutes. The attendants for this meeting include Tyler Pita MD, Ashlyn Bruning PA-C, [place], and patient, DAXON KYNE. During the encounter, Tyler Pita MD, Ashlyn Bruning PA-C, and scribe, Wilburn Mylar were located at [place].  Patient, CAUY MELODY was located at home.    Nicholos Johns, PA-C    Tyler Pita, MD  [facility] Direct Dial: [phone]  Fax: [phone] [email or web address]  Skype  [email or web address]  This document serves as a record of services personally performed by Tyler Pita, MD and Freeman Caldron, PA-C. It was created on their behalf by Wilburn Mylar, a trained medical scribe. The creation of this record is based on the scribe's personal observations and the provider's statements to them. This document has been checked and approved by the attending provider.

## 2019-04-19 ENCOUNTER — Ambulatory Visit
Admission: RE | Admit: 2019-04-19 | Discharge: 2019-04-19 | Disposition: A | Discharge status: Home / Self Care | Payer: Medicaid Other | Source: Ambulatory Visit | Attending: Radiation Oncology | Admitting: Radiation Oncology

## 2019-04-19 ENCOUNTER — Other Ambulatory Visit: Payer: Self-pay

## 2019-04-19 DIAGNOSIS — C61 Malignant neoplasm of prostate: Secondary | ICD-10-CM | POA: Diagnosis not present

## 2019-04-19 DIAGNOSIS — C7951 Secondary malignant neoplasm of bone: Secondary | ICD-10-CM | POA: Diagnosis not present

## 2019-04-19 DIAGNOSIS — Z192 Hormone resistant malignancy status: Secondary | ICD-10-CM | POA: Insufficient documentation

## 2019-04-19 DIAGNOSIS — Z510 Encounter for antineoplastic radiation therapy: Secondary | ICD-10-CM | POA: Diagnosis not present

## 2019-04-19 NOTE — Progress Notes (Signed)
  Radiation Oncology         (336) [phone] ________________________________  Name: CALIX HEINBAUGH MRN: [id number]  Date: 04/19/2019  DOB: [birth year]  SIMULATION AND TREATMENT PLANNING NOTE    ICD-10-CM   1. 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 Malignant neoplasm of prostate ([place])  C61     DIAGNOSIS:  69 y.o. 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  Identity was confirmed.  All relevant records and images related to the planned course of therapy were reviewed.  The patient freely provided informed written consent to proceed with treatment after reviewing the details related to the planned course of therapy. The consent form was witnessed and verified by the simulation staff.  Then, the patient was set-up in a stable reproducible  supine position for radiation therapy.  CT images were obtained.  Surface markings were placed.  The CT images were loaded into the planning software.  Then the target and avoidance structures were contoured including kidneys.  Treatment planning then occurred.  The radiation prescription was entered and confirmed.  Then, I designed and supervised the construction of a total of 3 medically necessary complex treatment devices with VacLoc positioner and 2 MLCs to shield kidneys.  I have requested : 3D Simulation  I have requested a DVH of the following structures: Left Kidney, Right Kidney and target.  PLAN:  The patient will receive 30 Gy in 10 fractions.  ________________________________  Sheral Apley Tammi Klippel, M.D.

## 2019-04-21 ENCOUNTER — Other Ambulatory Visit: Payer: Self-pay

## 2019-04-21 ENCOUNTER — Other Ambulatory Visit: Payer: Self-pay | Admitting: Radiation Oncology

## 2019-04-21 ENCOUNTER — Ambulatory Visit: Payer: Medicaid Other

## 2019-04-21 DIAGNOSIS — Z510 Encounter for antineoplastic radiation therapy: Secondary | ICD-10-CM | POA: Diagnosis not present

## 2019-04-21 DIAGNOSIS — C7951 Secondary malignant neoplasm of bone: Secondary | ICD-10-CM | POA: Diagnosis not present

## 2019-04-21 DIAGNOSIS — C61 Malignant neoplasm of prostate: Secondary | ICD-10-CM | POA: Diagnosis not present

## 2019-04-21 MED ORDER — OXYBUTYNIN CHLORIDE 5 MG PO TABS: 5.0000 mg | ORAL_TABLET | Freq: Three times a day (TID) | ORAL | 5 refills | Status: AC | PRN

## 2019-04-24 ENCOUNTER — Encounter: Payer: Self-pay | Admitting: Medical Oncology

## 2019-04-24 ENCOUNTER — Other Ambulatory Visit: Payer: Self-pay | Admitting: Oncology

## 2019-04-24 ENCOUNTER — Ambulatory Visit
Admission: RE | Admit: 2019-04-24 | Discharge: 2019-04-24 | Disposition: A | Discharge status: Home / Self Care | Payer: Medicaid Other | Source: Ambulatory Visit | Attending: Radiation Oncology | Admitting: Radiation Oncology

## 2019-04-24 ENCOUNTER — Telehealth: Payer: Self-pay | Admitting: Medical Oncology

## 2019-04-24 ENCOUNTER — Other Ambulatory Visit: Payer: Self-pay

## 2019-04-24 DIAGNOSIS — Z510 Encounter for antineoplastic radiation therapy: Secondary | ICD-10-CM | POA: Diagnosis not present

## 2019-04-24 DIAGNOSIS — C61 Malignant neoplasm of prostate: Secondary | ICD-10-CM | POA: Diagnosis not present

## 2019-04-24 DIAGNOSIS — C7951 Secondary malignant neoplasm of bone: Secondary | ICD-10-CM | POA: Diagnosis not present

## 2019-04-24 MED ORDER — HYDROCODONE-ACETAMINOPHEN 5-325 MG PO TABS: 1.0000 | ORAL_TABLET | Freq: Four times a day (QID) | ORAL | 0 refills | Status: DC | PRN

## 2019-04-24 NOTE — Telephone Encounter (Signed)
Patient called requesting a refill on his pain medication. I informed him, he needs to call Dr. Hazeline Junker nurse to request. He states his back pain was worse over the weekend. He had CT simulation 1/15 and starts radiation to the spine this afternoon. I asked if he started ADT and he still has not decided on ADT or orchiectomy. He said that he will decide by the end of the month. I discussed the role and Importance of ADT to control the cancer and without treatment, his cancer will continue to progress. I asked him to give it some serious thought and let me know if he would like to proceed with ADT and I can let Dr. [name] know. He voiced understanding. He is scheduled to start radiation at 2 pm and I will see him at this appointment.

## 2019-04-25 ENCOUNTER — Other Ambulatory Visit: Payer: Self-pay

## 2019-04-25 ENCOUNTER — Ambulatory Visit
Admission: RE | Admit: 2019-04-25 | Discharge: 2019-04-25 | Disposition: A | Discharge status: Home / Self Care | Payer: Medicaid Other | Source: Ambulatory Visit | Attending: Radiation Oncology | Admitting: Radiation Oncology

## 2019-04-25 DIAGNOSIS — C7951 Secondary malignant neoplasm of bone: Secondary | ICD-10-CM | POA: Diagnosis not present

## 2019-04-25 DIAGNOSIS — C61 Malignant neoplasm of prostate: Secondary | ICD-10-CM | POA: Diagnosis not present

## 2019-04-25 DIAGNOSIS — Z510 Encounter for antineoplastic radiation therapy: Secondary | ICD-10-CM | POA: Diagnosis not present

## 2019-04-26 ENCOUNTER — Other Ambulatory Visit: Payer: Self-pay

## 2019-04-26 ENCOUNTER — Ambulatory Visit
Admission: RE | Admit: 2019-04-26 | Discharge: 2019-04-26 | Disposition: A | Discharge status: Home / Self Care | Payer: Medicaid Other | Source: Ambulatory Visit | Attending: Radiation Oncology | Admitting: Radiation Oncology

## 2019-04-26 DIAGNOSIS — Z510 Encounter for antineoplastic radiation therapy: Secondary | ICD-10-CM | POA: Diagnosis not present

## 2019-04-26 DIAGNOSIS — C61 Malignant neoplasm of prostate: Secondary | ICD-10-CM | POA: Diagnosis not present

## 2019-04-26 DIAGNOSIS — C7951 Secondary malignant neoplasm of bone: Secondary | ICD-10-CM | POA: Diagnosis not present

## 2019-04-27 ENCOUNTER — Ambulatory Visit
Admission: RE | Admit: 2019-04-27 | Discharge: 2019-04-27 | Disposition: A | Discharge status: Home / Self Care | Payer: Medicaid Other | Source: Ambulatory Visit | Attending: Radiation Oncology | Admitting: Radiation Oncology

## 2019-04-27 ENCOUNTER — Other Ambulatory Visit: Payer: Self-pay

## 2019-04-27 DIAGNOSIS — C61 Malignant neoplasm of prostate: Secondary | ICD-10-CM | POA: Diagnosis not present

## 2019-04-27 DIAGNOSIS — Z510 Encounter for antineoplastic radiation therapy: Secondary | ICD-10-CM | POA: Diagnosis not present

## 2019-04-27 DIAGNOSIS — C7951 Secondary malignant neoplasm of bone: Secondary | ICD-10-CM | POA: Diagnosis not present

## 2019-04-28 ENCOUNTER — Other Ambulatory Visit: Payer: Self-pay | Admitting: Urology

## 2019-04-28 ENCOUNTER — Other Ambulatory Visit: Payer: Self-pay

## 2019-04-28 ENCOUNTER — Ambulatory Visit
Admission: RE | Admit: 2019-04-28 | Discharge: 2019-04-28 | Disposition: A | Discharge status: Home / Self Care | Payer: Medicaid Other | Source: Ambulatory Visit | Attending: Radiation Oncology | Admitting: Radiation Oncology

## 2019-04-28 ENCOUNTER — Other Ambulatory Visit: Payer: Self-pay | Admitting: Radiation Oncology

## 2019-04-28 ENCOUNTER — Other Ambulatory Visit: Payer: Self-pay | Admitting: Radiation Therapy

## 2019-04-28 DIAGNOSIS — Z510 Encounter for antineoplastic radiation therapy: Secondary | ICD-10-CM | POA: Diagnosis not present

## 2019-04-28 DIAGNOSIS — C7951 Secondary malignant neoplasm of bone: Secondary | ICD-10-CM | POA: Diagnosis not present

## 2019-04-28 DIAGNOSIS — C61 Malignant neoplasm of prostate: Secondary | ICD-10-CM | POA: Diagnosis not present

## 2019-04-28 MED ORDER — BIOTENE ORALBALANCE DRY MOUTH MT GEL: 1.0000 | Freq: Three times a day (TID) | OROMUCOSAL | 5 refills | Status: DC

## 2019-04-28 MED ORDER — OXYCODONE HCL 5 MG PO TABS: 5.0000 mg | ORAL_TABLET | ORAL | 0 refills | Status: DC | PRN

## 2019-05-01 ENCOUNTER — Other Ambulatory Visit: Payer: Self-pay

## 2019-05-01 ENCOUNTER — Ambulatory Visit
Admission: RE | Admit: 2019-05-01 | Discharge: 2019-05-01 | Disposition: A | Discharge status: Home / Self Care | Payer: Medicaid Other | Source: Ambulatory Visit | Attending: Radiation Oncology | Admitting: Radiation Oncology

## 2019-05-01 ENCOUNTER — Telehealth: Payer: Self-pay | Admitting: *Deleted

## 2019-05-01 DIAGNOSIS — C7951 Secondary malignant neoplasm of bone: Secondary | ICD-10-CM | POA: Diagnosis not present

## 2019-05-01 DIAGNOSIS — Z510 Encounter for antineoplastic radiation therapy: Secondary | ICD-10-CM | POA: Diagnosis not present

## 2019-05-01 DIAGNOSIS — C61 Malignant neoplasm of prostate: Secondary | ICD-10-CM | POA: Diagnosis not present

## 2019-05-01 NOTE — Telephone Encounter (Signed)
CALLED PATIENT TO INFORM OF NUTRITION APPT. WITH BARBARA NEFF ON 05/04/19 @ 2:30 PM, SPOKE WITH PATIENT AND HE IS AWARE OF THIS APPT.

## 2019-05-02 ENCOUNTER — Ambulatory Visit
Admission: RE | Admit: 2019-05-02 | Discharge: 2019-05-02 | Disposition: A | Discharge status: Home / Self Care | Payer: Medicaid Other | Source: Ambulatory Visit | Attending: Radiation Oncology | Admitting: Radiation Oncology

## 2019-05-02 ENCOUNTER — Other Ambulatory Visit: Payer: Self-pay

## 2019-05-02 DIAGNOSIS — Z510 Encounter for antineoplastic radiation therapy: Secondary | ICD-10-CM | POA: Diagnosis not present

## 2019-05-02 DIAGNOSIS — C61 Malignant neoplasm of prostate: Secondary | ICD-10-CM | POA: Diagnosis not present

## 2019-05-02 DIAGNOSIS — C7951 Secondary malignant neoplasm of bone: Secondary | ICD-10-CM | POA: Diagnosis not present

## 2019-05-03 ENCOUNTER — Other Ambulatory Visit: Payer: Self-pay

## 2019-05-03 ENCOUNTER — Ambulatory Visit
Admission: RE | Admit: 2019-05-03 | Discharge: 2019-05-03 | Disposition: A | Discharge status: Home / Self Care | Payer: Medicaid Other | Source: Ambulatory Visit | Attending: Radiation Oncology | Admitting: Radiation Oncology

## 2019-05-03 DIAGNOSIS — R338 Other retention of urine: Secondary | ICD-10-CM | POA: Diagnosis not present

## 2019-05-03 DIAGNOSIS — C7951 Secondary malignant neoplasm of bone: Secondary | ICD-10-CM | POA: Diagnosis not present

## 2019-05-03 DIAGNOSIS — Z510 Encounter for antineoplastic radiation therapy: Secondary | ICD-10-CM | POA: Diagnosis not present

## 2019-05-03 DIAGNOSIS — C775 Secondary and unspecified malignant neoplasm of intrapelvic lymph nodes: Secondary | ICD-10-CM | POA: Diagnosis not present

## 2019-05-03 DIAGNOSIS — C61 Malignant neoplasm of prostate: Secondary | ICD-10-CM | POA: Diagnosis not present

## 2019-05-04 ENCOUNTER — Encounter: Payer: Self-pay | Admitting: Radiation Oncology

## 2019-05-04 ENCOUNTER — Ambulatory Visit
Admission: RE | Admit: 2019-05-04 | Discharge: 2019-05-04 | Disposition: A | Discharge status: Home / Self Care | Payer: Medicaid Other | Source: Ambulatory Visit | Attending: Radiation Oncology | Admitting: Radiation Oncology

## 2019-05-04 ENCOUNTER — Inpatient Hospital Stay: Payer: Medicaid Other | Admitting: Nutrition

## 2019-05-04 ENCOUNTER — Other Ambulatory Visit: Payer: Self-pay

## 2019-05-04 DIAGNOSIS — Z510 Encounter for antineoplastic radiation therapy: Secondary | ICD-10-CM | POA: Diagnosis not present

## 2019-05-04 DIAGNOSIS — C61 Malignant neoplasm of prostate: Secondary | ICD-10-CM | POA: Diagnosis not present

## 2019-05-04 DIAGNOSIS — C7951 Secondary malignant neoplasm of bone: Secondary | ICD-10-CM | POA: Diagnosis not present

## 2019-05-04 NOTE — Progress Notes (Signed)
69 year old male diagnosed with met Prostate cancer who has completed radiation therapy. He is a patient of Dr. Tammi Klippel.  PMH reviewed.  Medications include Colace and Senekot.  Labs include K 3.2, Glucose 109, and Alb 2.9.  Height: 68 in Weight: 208 pounds Jan 27. UBW: 224 pounds Mar 21, 2019 BMI: 31.6  Patient has had significant weight loss of 7% over 1 month. Patient reports poor appetite and wt loss. He has constipation and increased gas. Also reports fatigue.  Nutrition Diagnosis: Unintended wt loss related to poor appetite and inadequate oral intake as evidenced by 7% wt loss over 1 month.  Intervention: Educated patient on importance of increasing small, frequent meals and snacks 5-6 times daily. Recommend ONS 2-3 times daily. Educated on strategies to improve constipation and gas. Provided fact sheets, samples, coupons and contact information. Questions answered and teach back method used.  Monitoring, Evaluation, Goals: Patient will increase oral intake to improve QOL and minimize wt loss.  No follow up scheduled. Patient has my contact information.

## 2019-05-05 ENCOUNTER — Telehealth: Payer: Self-pay | Admitting: Radiation Oncology

## 2019-05-05 NOTE — Telephone Encounter (Signed)
Received return voicemail from patient. Phoned patient back promptly. Patient reports constipation, bloating and indigestion. Noted patient is taking hydrocodone-acetaminophen every six hours and oxycodone three times per day. Additionally noted patient's intake of food and water is low due to poor appetite. Patient has consulted with nutrition. Explained the constipation, bloating and possible new onset of indigestion are all correlated; complicated by his inability to ambulate freely. Encouraged a good bowel regimen. Patient reports using a fleets enema yesterday. Encouraged taking Miralax once per day and colace bid. Patient questioned drinking prune juice. Explained prune juice certainly stimulates the bowels but if that doesn't work Miralax is a great alternative. Explained that once the constipation resolves so will the bloating. Explained tums and prilosec would be helpful with indigestion. Explained the indigestion could be short term and related to what he ate that day. Advised patient moving forward that the radiation continues to work thus he may see continued improvement reference pain in his thoracic spine. Advised he maintain follow up with Dr. [name] and confirmed appointments for lab and follow up on 05/24/2019. Confirmed telephone follow up with Ashlyn Bruning, PA-C on 06/07/19. Patient verbalized understanding of all reviewed and confirmed the conversation was helpful.  This RN committed to passing along his thanks and gratitude for the excellent care Dr. Tammi Klippel provided him.

## 2019-05-05 NOTE — Telephone Encounter (Signed)
Patient completed radiation therapy yesterday. I understand from the therapist the patient has questions. Phoned patient to inquire further. No answer. Left voicemail message with my direct line requesting a return call.

## 2019-05-06 NOTE — Progress Notes (Signed)
  Radiation Oncology         [phone]) [phone] ________________________________  Name: Andre Glass MRN: [id number]  Date: 05/04/2019  DOB: 1950/11/11  End of Treatment Note  Diagnosis:    69 y.o. 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   His pain improved.  Spinal cord function remained intact.  Plan: The patient has completed radiation treatment. The patient will return to radiation oncology clinic for routine followup in one month. I advised him to call or return sooner if he has any questions or concerns related to his recovery or treatment. ________________________________  Sheral Apley. Tammi Klippel, M.D.

## 2019-05-10 ENCOUNTER — Telehealth: Payer: Self-pay

## 2019-05-10 NOTE — Telephone Encounter (Signed)
Informed patient of Dr. Hazeline Junker recommendations listed below. Patient verbalized understanding. Patient instructed to call office with any additional questions or concerns.

## 2019-05-10 NOTE — Telephone Encounter (Signed)
-----   Message from [name], MD sent at 05/10/2019  3:06 PM EST ----- Regarding: RE: Patient question No additional recommendation regarding his back pain.  Anticipate improvement from the radiation.  He can use hydrocodone as needed. He can use Prilosec OTC for his indigestion.  Thanks ----- Message ----- From: Teodoro Spray, RN Sent: 05/10/2019   2:41 PM EST To: [name], MD Subject: Patient question                               Patient called office stating he has been experiencing pain in his back. States his back "feels like it is on fire".  Patient completed last radiation treatment on 05/04/19. States he was told by Dr. Johny Shears this "feeling" would start to go away after a couple of days but he is concerned that it has not. Patient states he is not taking his PRN pain meds due to history of constipation. Patient also complains of "sinus drainage that turns into indigestion". Patient states he has tried tums and prilosec previously and it does not help.  Patient is asking what he should do in regards to the pain and also the indigestion.   Please advise.

## 2019-05-19 ENCOUNTER — Telehealth: Payer: Self-pay | Admitting: Radiation Oncology

## 2019-05-19 ENCOUNTER — Other Ambulatory Visit: Payer: Self-pay | Admitting: Urology

## 2019-05-19 MED ORDER — OXYCODONE HCL 5 MG PO TABS: 5.0000 mg | ORAL_TABLET | ORAL | 0 refills | Status: DC | PRN

## 2019-05-19 NOTE — Telephone Encounter (Signed)
Received voicemail message from patient requesting a refill of his Oxy 5 mg. Patient reports he does not have enough medication to get through the weekend. Patient endorses burning back pain. Patient verbalizes that Dr. Hazeline Junker office recommended he call our office for a refill.   Diagnosis:    69 y.o. patient with thoracic spinal metastasis to T10 and T12     Indication for treatment:  Palliation       Radiation treatment dates:   1/15-1/28/21  Site/dose:   The spine from T9-L1 was treated to 30 Gy in 10 fractions  Oxy Ir 5 mg 1 tab every 4 hours prn severe pain. Prescribed by Freeman Caldron, PA-C on 04/28/19, qty 60.

## 2019-05-19 NOTE — Telephone Encounter (Signed)
Phoned patient back. Assured him the request to refill his oxy had been sent to [employer], PA-C earlier today. Explained PA Bruning had been in back to back consults all day. Explained that I anticipate she will refill his medication after she gets done with her last consult. Explained to him a reminder message has been sent to her as well. Patient verbalized understanding and expressed appreciation for the call.

## 2019-05-19 NOTE — Telephone Encounter (Signed)
I am happy to refill this, this time but future refills will need to come from Dr. [name] since he has completed his radiation treatment and is no longer under active treatment with [place]. Please communicate this to the patient and let him know that I have sent the Rx so he can pick it up this evening. -Mera Gunkel

## 2019-05-19 NOTE — Telephone Encounter (Signed)
Phoned patient. No answer. Left voicemail message. Explained the refill of oxy he requested has been escribed so he can pick it up this evening. Explained that per Freeman Caldron, PA-C this will be the last time she is able to fill this medication for him since he is no longer receiving radiation therapy. Provided my direct number for future needs or questions.

## 2019-05-24 ENCOUNTER — Inpatient Hospital Stay: Payer: Medicaid Other

## 2019-05-24 ENCOUNTER — Other Ambulatory Visit: Payer: Self-pay | Admitting: Oncology

## 2019-05-24 ENCOUNTER — Inpatient Hospital Stay: Payer: Medicaid Other | Attending: Oncology | Admitting: Oncology

## 2019-05-24 ENCOUNTER — Other Ambulatory Visit: Payer: Self-pay

## 2019-05-24 VITALS — BP 174/89 | HR 83 | Temp 98.7°F | Resp 18 | Ht 68.0 in | Wt 200.6 lb

## 2019-05-24 DIAGNOSIS — R339 Retention of urine, unspecified: Secondary | ICD-10-CM | POA: Diagnosis not present

## 2019-05-24 DIAGNOSIS — Z923 Personal history of irradiation: Secondary | ICD-10-CM | POA: Insufficient documentation

## 2019-05-24 DIAGNOSIS — C61 Malignant neoplasm of prostate: Secondary | ICD-10-CM

## 2019-05-24 DIAGNOSIS — Z79818 Long term (current) use of other agents affecting estrogen receptors and estrogen levels: Secondary | ICD-10-CM | POA: Diagnosis not present

## 2019-05-24 DIAGNOSIS — C7951 Secondary malignant neoplasm of bone: Secondary | ICD-10-CM | POA: Diagnosis not present

## 2019-05-24 DIAGNOSIS — Z79899 Other long term (current) drug therapy: Secondary | ICD-10-CM | POA: Diagnosis not present

## 2019-05-24 LAB — CMP (CANCER CENTER ONLY)
ALT: 8 U/L (ref 0–44)
AST: 15 U/L (ref 15–41)
Albumin: 2.7 g/dL — ABNORMAL LOW (ref 3.5–5.0)
Alkaline Phosphatase: 81 U/L (ref 38–126)
Anion gap: 9 (ref 5–15)
BUN: 13 mg/dL (ref 8–23)
CO2: 27 mmol/L (ref 22–32)
Calcium: 9.3 mg/dL (ref 8.9–10.3)
Chloride: 103 mmol/L (ref 98–111)
Creatinine: 1.52 mg/dL — ABNORMAL HIGH (ref 0.61–1.24)
GFR, Est AFR Am: 54 mL/min — ABNORMAL LOW (ref >60)
GFR, Estimated: 46 mL/min — ABNORMAL LOW (ref >60)
Glucose, Bld: 87 mg/dL (ref 70–99)
Potassium: 4 mmol/L (ref 3.5–5.1)
Sodium: 139 mmol/L (ref 135–145)
Total Bilirubin: 0.4 mg/dL (ref 0.3–1.2)
Total Protein: 8.9 g/dL — ABNORMAL HIGH (ref 6.5–8.1)

## 2019-05-24 LAB — CBC WITH DIFFERENTIAL (CANCER CENTER ONLY)
Abs Immature Granulocytes: 0.01 10*3/uL (ref 0.00–0.07)
Basophils Absolute: 0 10*3/uL (ref 0.0–0.1)
Basophils Relative: 0 %
Eosinophils Absolute: 0.2 10*3/uL (ref 0.0–0.5)
Eosinophils Relative: 3 %
HCT: 31.4 % — ABNORMAL LOW (ref 39.0–52.0)
Hemoglobin: 10.1 g/dL — ABNORMAL LOW (ref 13.0–17.0)
Immature Granulocytes: 0 %
Lymphocytes Relative: 21 %
Lymphs Abs: 1.4 10*3/uL (ref 0.7–4.0)
MCH: 28.9 pg (ref 26.0–34.0)
MCHC: 32.2 g/dL (ref 30.0–36.0)
MCV: 90 fL (ref 80.0–100.0)
Monocytes Absolute: 0.5 10*3/uL (ref 0.1–1.0)
Monocytes Relative: 8 %
Neutro Abs: 4.4 10*3/uL (ref 1.7–7.7)
Neutrophils Relative %: 68 %
Platelet Count: 328 10*3/uL (ref 150–400)
RBC: 3.49 MIL/uL — ABNORMAL LOW (ref 4.22–5.81)
RDW: 13.5 % (ref 11.5–15.5)
WBC Count: 6.6 10*3/uL (ref 4.0–10.5)
nRBC: 0 % (ref 0.0–0.2)

## 2019-05-24 MED ORDER — OXYCODONE HCL 5 MG PO TABS: 5.0000 mg | ORAL_TABLET | ORAL | 0 refills | Status: DC | PRN

## 2019-05-24 NOTE — Progress Notes (Signed)
Hematology and Oncology Follow Up Visit  Andre Glass [id number] 1950/07/05 69 y.o. 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  He presented with advanced disease, Gleason score 4+5 = 9 and a PSA of 366 at that time.   Prior Therapy: He is status post prostate biopsy completed on December 3 of 2020.  Pathology showed a Gleason score 4+5 = 9 with 12 out of 12 cores.  He is status post both of radiation therapy to the spine between T9 and L1.  He received 30 Gray in 10 fractions between January 15 and 28, 2021.   Current therapy:     Interim History: Andre Glass returns today for repeat evaluation.  Since the last visit, he completed radiation therapy without any significant improvement in his pain.  His pain remains diffuse predominantly in the lower and upper back and radiating to his legs as well.  He does ambulate without any difficulties but his mobility is limited.  He continues to use oxycodone every 4 hours although the duration is not enough and experiences more pain in 2 hours.  His performance status is limited at this time.     Medications: Unchanged on review. Current Outpatient Medications  Medication Sig Dispense Refill  . acetaminophen (TYLENOL) 325 MG tablet Take 650 mg by mouth every 6 (six) hours as needed for mild pain.    . Artificial Saliva (BIOTENE ORALBALANCE DRY MOUTH) GEL Use as directed 1 Dose in the mouth or throat 4 (four) times daily -  before meals and at bedtime. 42 g 5  . docusate sodium (COLACE) 100 MG capsule Take 100 mg by mouth daily as needed for mild constipation or moderate constipation.    Marland Kitchen HYDROcodone-acetaminophen (NORCO) 5-325 MG tablet Take 1 tablet by mouth every 6 (six) hours as needed for moderate pain. 50 tablet 0  . oxyCODONE (OXY IR/ROXICODONE) 5 MG immediate release tablet Take 1  tablet (5 mg total) by mouth every 4 (four) hours as needed for severe pain. 60 tablet 0  . Pseudoephedrine HCl (SUDAFED) 30 MG/5ML LIQD Take 30 mg by mouth 4 (four) times daily as needed for congestion.    . senna (SENOKOT) 8.6 MG TABS tablet Take 1 tablet by mouth daily as needed for mild constipation.    . tamsulosin (FLOMAX) 0.4 MG CAPS capsule Take 1 capsule (0.4 mg total) by mouth daily. (Patient not taking: Reported on 04/08/2019) 30 capsule 0   No current facility-administered medications for this visit.     Allergies:  Allergies  Allergen Reactions  . Ibuprofen Anaphylaxis  . Shrimp [Shellfish Allergy] Anaphylaxis       Physical Exam: Blood pressure (!) 174/89, pulse 83, temperature 98.7 F (37.1 C), temperature source Temporal, resp. rate 18, height 5\' 8"  (1.727 m), weight 200 lb 9.6 oz (91 kg), SpO2 99 %.    ECOG: 1     General appearance: Alert, awake without any distress. Head: Atraumatic without abnormalities Oropharynx: Without any thrush or ulcers. Eyes: No scleral icterus. Lymph nodes: No lymphadenopathy noted in the cervical, supraclavicular, or axillary nodes Heart:regular rate and rhythm, without any murmurs or gallops.   Lung: Clear to auscultation without any rhonchi, wheezes or dullness to percussion. Abdomin: Soft, nontender without any shifting dullness or ascites. Musculoskeletal: No clubbing or cyanosis. Neurological: No motor or sensory deficits. Skin: No rashes or lesions.  Lab Results: Lab Results  Component Value Date   WBC 10.2 04/11/2019   HGB 11.5 (L) 04/11/2019   HCT 34.9 (L) 04/11/2019   MCV 89.7 04/11/2019   PLT 420 (H) 04/11/2019     Chemistry      Component Value Date/Time   NA 140 04/11/2019 1501   K 3.2 (L) 04/11/2019 1501   CL 100 04/11/2019 1501   CO2 28 04/11/2019 1501   BUN 9 04/11/2019 1501   CREATININE 1.01 04/11/2019 1501      Component Value Date/Time   CALCIUM 9.6 04/11/2019 1501   ALKPHOS 85  04/11/2019 1501   AST 18 04/11/2019 1501   ALT 13 04/11/2019 1501   BILITOT 0.5 04/11/2019 1501       Impression and Plan:   69 year old man with:  1.    Castration-sensitive prostate cancer with disease to bone and lymphadenopathy.    His disease status was updated again and treatment options were reiterated.  He understands that androgen deprivation is essential to treat his prostate cancer.  Therapy escalation with Andre Glass or systemic chemotherapy would be considered after androgen deprivation.  These options will be discussed in the future  2.  Androgen deprivation therapy: The rationale for using androgen deprivation therapy as well as the complication associated with it reviewed today in details.  Side effects including weight gain, hot flashes, sexual dysfunction.  The benefit is clinically outweighs any side effects at this time which include cancer reduction and improvement in his symptoms.  He understands without this treatment his cancer will continue to progress and he will continue to suffer more consequences and more pain.  He is not interested in orchiectomy as an option but is willing to proceed with chemical castration.  We will proceed with Andre Glass in the immediate future and switch to Lupron later date.  3.  Bone directed therapy: Recommended continuing calcium and vitamin D supplement for the time being with consideration with Xgeva after dental clearance and starting androgen deprivation.  4.  Bone pain: Related to his prostate cancer and received palliative radiation therapy.  No improvement in his pain at this time.  I will refill his oxycodone but I anticipate improvement after starting androgen deprivation.    5.  Urinary retention: Catheter remains in place and follows with Andre Glass regarding this issue.  6.  Follow-up: Will be in the next 4 to 5 weeks for repeat evaluation.    30  minutes was dedicated to this visit.  The time was spent on  reviewing his disease status, treatment options and coordinating future plan of care.   Andre Button, MD 2/17/20213:02 PM

## 2019-05-25 ENCOUNTER — Telehealth: Payer: Self-pay | Admitting: Oncology

## 2019-05-25 LAB — PROSTATE-SPECIFIC AG, SERUM (LABCORP): Prostate Specific Ag, Serum: 1118 ng/mL — ABNORMAL HIGH (ref 0.0–4.0)

## 2019-05-25 NOTE — Telephone Encounter (Signed)
Scheduled appt per 2/17 los.  Left a VM of the appt date and time.

## 2019-05-29 ENCOUNTER — Emergency Department (HOSPITAL_COMMUNITY): Payer: Medicaid Other

## 2019-05-29 ENCOUNTER — Encounter (HOSPITAL_COMMUNITY): Payer: Self-pay | Admitting: Emergency Medicine

## 2019-05-29 ENCOUNTER — Emergency Department (HOSPITAL_COMMUNITY)
Admission: EM | Admit: 2019-05-29 | Discharge: 2019-05-29 | Disposition: A | Discharge status: Home / Self Care | Payer: Medicaid Other | Attending: Emergency Medicine | Admitting: Emergency Medicine

## 2019-05-29 ENCOUNTER — Other Ambulatory Visit: Payer: Self-pay

## 2019-05-29 DIAGNOSIS — M545 Low back pain: Secondary | ICD-10-CM | POA: Diagnosis not present

## 2019-05-29 DIAGNOSIS — C61 Malignant neoplasm of prostate: Secondary | ICD-10-CM | POA: Diagnosis not present

## 2019-05-29 DIAGNOSIS — C7951 Secondary malignant neoplasm of bone: Secondary | ICD-10-CM | POA: Diagnosis not present

## 2019-05-29 DIAGNOSIS — C412 Malignant neoplasm of vertebral column: Secondary | ICD-10-CM | POA: Diagnosis not present

## 2019-05-29 DIAGNOSIS — M47814 Spondylosis without myelopathy or radiculopathy, thoracic region: Secondary | ICD-10-CM | POA: Diagnosis not present

## 2019-05-29 DIAGNOSIS — C419 Malignant neoplasm of bone and articular cartilage, unspecified: Secondary | ICD-10-CM

## 2019-05-29 DIAGNOSIS — M2578 Osteophyte, vertebrae: Secondary | ICD-10-CM | POA: Diagnosis not present

## 2019-05-29 DIAGNOSIS — M47817 Spondylosis without myelopathy or radiculopathy, lumbosacral region: Secondary | ICD-10-CM | POA: Diagnosis not present

## 2019-05-29 MED ORDER — HYDROMORPHONE HCL 2 MG PO TABS: 1.0000 mg | ORAL_TABLET | ORAL | 0 refills | Status: DC | PRN

## 2019-05-29 MED ORDER — HYDROMORPHONE HCL 1 MG/ML IJ SOLN
1.0000 mg | Freq: Once | INTRAMUSCULAR | Status: AC
  Administered 2019-05-29: 1 mg via INTRAVENOUS
  Filled 2019-05-29: qty 1

## 2019-05-29 MED ORDER — GADOBUTROL 1 MMOL/ML IV SOLN
10.0000 mL | Freq: Once | INTRAVENOUS | Status: AC | PRN
  Administered 2019-05-29: 10 mL via INTRAVENOUS

## 2019-05-29 MED ORDER — HYDROMORPHONE HCL 1 MG/ML IJ SOLN
1.0000 mg | Freq: Once | INTRAMUSCULAR | Status: AC
  Administered 2019-05-29: 1 mg via INTRAVENOUS

## 2019-05-29 MED ORDER — LACTULOSE 10 GM/15ML PO SOLN: 10.0000 g | Freq: Two times a day (BID) | ORAL | 0 refills | Status: DC | PRN

## 2019-05-29 MED ORDER — HYDROMORPHONE HCL 1 MG/ML IJ SOLN
1.0000 mg | Freq: Once | INTRAMUSCULAR | Status: DC
  Filled 2019-05-29: qty 1

## 2019-05-29 MED ORDER — BISACODYL 10 MG RE SUPP: 10.0000 mg | RECTAL | 0 refills | Status: DC | PRN

## 2019-05-29 NOTE — ED Notes (Signed)
Pt transported to imaging.

## 2019-05-29 NOTE — ED Provider Notes (Signed)
[facility] DEPT Provider Note   CSN: [id number] Arrival date & time: 05/29/19  0715     History Chief Complaint  Patient presents with  . Back Pain    Andre Glass is a 69 y.o. male.  HPI Patient presents to the emergency department with back pain from metastatic disease in the thoracic and lumbar spine.  The patient states the pain gets severe the last 3 days.  Patient states that he has been taking oxycodone without relief of his symptoms.  Patient states that nothing seems to make the condition better but certain positions and movements make the pain worse.  The patient denies chest pain, shortness of breath, headache,blurred vision, neck pain, fever, cough, weakness, numbness, dizziness, anorexia, edema, abdominal pain, nausea, vomiting, diarrhea, rash,  dysuria, hematemesis, bloody stool, near syncope, or syncope.    Past Medical History:  Diagnosis Date  . Prostate cancer [facility])     Patient Active Problem List   Diagnosis Date Noted  . Spine metastasis ([place]) 04/19/2019  . Malignant neoplasm of prostate ([place]) 04/19/2019    History reviewed. No pertinent surgical history.     Family History  Problem Relation Age of Onset  . Stomach cancer Mother   . Pancreatic cancer Sister   . Breast cancer Neg Hx   . Prostate cancer Neg Hx   . Colon cancer Neg Hx     Social History   Tobacco Use  . Smoking status: Never Smoker  . Smokeless tobacco: Never Used  Substance Use Topics  . Alcohol use: Never  . Drug use: Never    Home Medications Prior to Admission medications   Medication Sig Start Date End Date Taking? Authorizing Provider  acetaminophen (TYLENOL) 325 MG tablet Take 650 mg by mouth every 6 (six) hours as needed for mild pain.   Yes [provider]  Artificial Saliva (BIOTENE ORALBALANCE DRY MOUTH) GEL Use as directed 1 Dose in the mouth or throat 4 (four) times daily -  before meals and at bedtime. 04/28/19  Yes Tyler Pita, MD  docusate sodium (COLACE) 100 MG capsule Take 100 mg by mouth daily as needed for mild constipation or moderate constipation.   Yes [provider]  oxybutynin (DITROPAN) 5 MG tablet Take 5 mg by mouth 3 (three) times daily.   Yes [provider]  oxyCODONE (OXY IR/ROXICODONE) 5 MG immediate release tablet Take 1 tablet (5 mg total) by mouth every 4 (four) hours as needed for severe pain. 05/24/19  Yes [name], MD  Pseudoephedrine HCl (SUDAFED) 30 MG/5ML LIQD Take 30 mg by mouth 4 (four) times daily as needed for congestion.   Yes [provider]  senna (SENOKOT) 8.6 MG TABS tablet Take 1 tablet by mouth daily as needed for mild constipation.   Yes [provider]  tamsulosin (FLOMAX) 0.4 MG CAPS capsule Take 1 capsule (0.4 mg total) by mouth daily. 02/21/19  Yes Quintella Reichert, MD    Allergies    Ibuprofen and Shrimp [shellfish allergy]  Review of Systems   Review of Systems All other systems negative except as documented in the HPI. All pertinent positives and negatives as reviewed in the HPI. Physical Exam Updated Vital Signs BP (!) 158/80   Pulse 76   Temp 97.9 F (36.6 C) (Oral)   Resp 18   SpO2 97%   Physical Exam Vitals and nursing note reviewed.  Constitutional:      General: He is not in  acute distress.    Appearance: He is well-developed.  HENT:     Head: Normocephalic and atraumatic.  Eyes:     Pupils: Pupils are equal, round, and reactive to light.  Cardiovascular:     Rate and Rhythm: Normal rate and regular rhythm.     Heart sounds: Normal heart sounds. No murmur. No friction rub. No gallop.   Pulmonary:     Effort: Pulmonary effort is normal. No respiratory distress.     Breath sounds: Normal breath sounds. No wheezing.  Musculoskeletal:     Cervical back: Normal range of motion and neck supple.  Skin:    General: Skin is warm and dry.     Capillary Refill: Capillary refill takes less than 2 seconds.      Findings: No erythema or rash.  Neurological:     Mental Status: He is alert and oriented to person, place, and time.     Sensory: No sensory deficit.     Motor: No weakness or abnormal muscle tone.     Coordination: Coordination normal.     Gait: Gait normal.  Psychiatric:        Behavior: Behavior normal.     ED Results / Procedures / Treatments   Labs (all labs ordered are listed, but only abnormal results are displayed) Labs Reviewed - No data to display  EKG None  Radiology DG Lumbar Spine Complete  Result Date: 05/29/2019 CLINICAL DATA:  Onset low back pain with a burning sensation five days ago. History prostate cancer. No known injury. EXAM: LUMBAR SPINE - COMPLETE 4+ VIEW COMPARISON:  Whole-body bone scan 04/10/2019 and CT abdomen and pelvis 04/08/2019. FINDINGS: No fracture or malalignment is identified. Scattered sclerotic lesions consistent with known metastatic disease are again seen. Largest lesion is in the T12 vertebral body. Paraspinous structures are negative. IMPRESSION: No acute abnormality. Unchanged sclerotic lesions consistent with known metastatic prostate cancer. Electronically Signed   By: [name] M.D.   On: 05/29/2019 08:42   MR THORACIC SPINE W WO CONTRAST  Result Date: 05/29/2019 CLINICAL DATA:  Metastatic prostate cancer EXAM: MRI THORACIC AND LUMBAR SPINE WITHOUT AND WITH CONTRAST TECHNIQUE: Multiplanar and multiecho pulse sequences of the thoracic and lumbar spine were obtained without and with intravenous contrast. CONTRAST:  43mL GADAVIST GADOBUTROL 1 MMOL/ML IV SOLN COMPARISON:  None. FINDINGS: MRI THORACIC SPINE Alignment:  There is no significant listhesis. Vertebrae: There is no vertebral collapse deformity. Abnormal STIR hyperintensity and enhancement present at T10 and T12 vertebral bodies reflecting known metastatic disease. There is extension into the left pedicle at T12. No significant epidural disease. Cord:  Unremarkable. Paraspinal  and other soft tissues: Unremarkable Disc levels: Mild multilevel degenerative disc disease and mild facet arthropathy. There is no high-grade degenerative stenosis. MRI LUMBAR SPINE FINDINGS Segmentation:  Standard. Alignment:  No significant listhesis. Vertebrae: Vertebral body heights are stable compared to prior abdominal CT. Small foci of low T1 and T2 signal reflecting sclerotic metastases are identified at all levels. Additionally, there are larger STIR hyperintense and enhancing metastatic lesions at L2 and L4. Sacral and iliac involvement is also noted. There is no significant lumbar epidural extension. There is left sacral foraminal extension at S2-S3 levels. Conus medullaris: Extends to the T12 level and appears normal. Paraspinal and other soft tissues: Bulky retroperitoneal adenopathy. Disc levels: L1-L2:  No canal or foraminal stenosis. L2-L3: Disc bulge eccentric to the left. No canal or right foraminal stenosis. Mild left foraminal stenosis. L3-L4: Disc bulge with superimposed  left foraminal protrusion. No canal stenosis. Mild right and mild to moderate left foraminal stenosis. Disc/osteophyte contacts the exiting left L3 nerve root. L4-L5: Disc bulge and facet arthropathy with ligamentum flavum infolding. No canal stenosis. Mild foraminal stenosis. L5-S1: Disc bulge with superimposed small central protrusion and facet arthropathy. No canal stenosis. Moderate right and mild left foraminal stenosis. IMPRESSION: Multifocal osseous metastatic disease. There is no significant epidural extension at thoracolumbar levels. There is left sacral foraminal extension at S3. No acute collapse deformity. Electronically Signed   By: Macy Mis M.D.   On: 05/29/2019 14:15   MR Lumbar Spine W Wo Contrast  Result Date: 05/29/2019 CLINICAL DATA:  Metastatic prostate cancer EXAM: MRI THORACIC AND LUMBAR SPINE WITHOUT AND WITH CONTRAST TECHNIQUE: Multiplanar and multiecho pulse sequences of the thoracic and  lumbar spine were obtained without and with intravenous contrast. CONTRAST:  73mL GADAVIST GADOBUTROL 1 MMOL/ML IV SOLN COMPARISON:  None. FINDINGS: MRI THORACIC SPINE Alignment:  There is no significant listhesis. Vertebrae: There is no vertebral collapse deformity. Abnormal STIR hyperintensity and enhancement present at T10 and T12 vertebral bodies reflecting known metastatic disease. There is extension into the left pedicle at T12. No significant epidural disease. Cord:  Unremarkable. Paraspinal and other soft tissues: Unremarkable Disc levels: Mild multilevel degenerative disc disease and mild facet arthropathy. There is no high-grade degenerative stenosis. MRI LUMBAR SPINE FINDINGS Segmentation:  Standard. Alignment:  No significant listhesis. Vertebrae: Vertebral body heights are stable compared to prior abdominal CT. Small foci of low T1 and T2 signal reflecting sclerotic metastases are identified at all levels. Additionally, there are larger STIR hyperintense and enhancing metastatic lesions at L2 and L4. Sacral and iliac involvement is also noted. There is no significant lumbar epidural extension. There is left sacral foraminal extension at S2-S3 levels. Conus medullaris: Extends to the T12 level and appears normal. Paraspinal and other soft tissues: Bulky retroperitoneal adenopathy. Disc levels: L1-L2:  No canal or foraminal stenosis. L2-L3: Disc bulge eccentric to the left. No canal or right foraminal stenosis. Mild left foraminal stenosis. L3-L4: Disc bulge with superimposed left foraminal protrusion. No canal stenosis. Mild right and mild to moderate left foraminal stenosis. Disc/osteophyte contacts the exiting left L3 nerve root. L4-L5: Disc bulge and facet arthropathy with ligamentum flavum infolding. No canal stenosis. Mild foraminal stenosis. L5-S1: Disc bulge with superimposed small central protrusion and facet arthropathy. No canal stenosis. Moderate right and mild left foraminal stenosis.  IMPRESSION: Multifocal osseous metastatic disease. There is no significant epidural extension at thoracolumbar levels. There is left sacral foraminal extension at S3. No acute collapse deformity. Electronically Signed   By: Macy Mis M.D.   On: 05/29/2019 14:15    Procedures Procedures (including critical care time)  Medications Ordered in ED Medications  HYDROmorphone (DILAUDID) injection 1 mg (1 mg Intravenous Given 05/29/19 0808)  HYDROmorphone (DILAUDID) injection 1 mg (1 mg Intravenous Given 05/29/19 1020)  gadobutrol (GADAVIST) 1 MMOL/ML injection 10 mL (10 mLs Intravenous Contrast Given 05/29/19 1316)  HYDROmorphone (DILAUDID) injection 1 mg (1 mg Intravenous Given 05/29/19 1425)    ED Course  I have reviewed the triage vital signs and the nursing notes.  Pertinent labs & imaging results that were available during my care of the patient were reviewed by me and considered in my medical decision making (see chart for details).    MDM Rules/Calculators/A&P                      The patient  has no significant bony destruction or collapse in the thoracic or lumbar spine.  Patient is neurologically intact.  Will work on pain control for the patient.  Will probably up his pain controlled with Dilaudid tablets at home. Final Clinical Impression(s) / ED Diagnoses Final diagnoses:  None    Rx / DC Orders ED Discharge Orders    None       Dalia Heading, PA-C 05/29/19 1432    Tegeler, Gwenyth Allegra, MD 05/29/19 [phone]

## 2019-05-29 NOTE — ED Notes (Signed)
RN and NT placed patient in gown, patient covered with multiple warm blankets. Patient denies claustrophobia, PA placed order for pain medication to be given prior to patient going to MRI for pain control. Patient agreeable to this plan of care.

## 2019-05-29 NOTE — Discharge Instructions (Addendum)
Follow-up with your doctor soon as possible.  Return here as needed.  Your MRI does not show any major abnormalities other than the lesions in the bone.

## 2019-05-29 NOTE — ED Triage Notes (Signed)
Pt c/o back pains. Reports has prostate cancer and spread to the bones. Couple weeks ago is last time had radiation on his back.

## 2019-05-30 ENCOUNTER — Other Ambulatory Visit: Payer: Self-pay | Admitting: Oncology

## 2019-05-30 ENCOUNTER — Telehealth: Payer: Self-pay

## 2019-05-30 MED ORDER — HYDROMORPHONE HCL 4 MG PO TABS: 4.0000 mg | ORAL_TABLET | ORAL | 0 refills | Status: DC | PRN

## 2019-05-30 MED ORDER — MORPHINE SULFATE ER 30 MG PO TBCR: 30.0000 mg | EXTENDED_RELEASE_TABLET | Freq: Two times a day (BID) | ORAL | 0 refills | Status: DC

## 2019-05-30 NOTE — Telephone Encounter (Signed)
-----   Message from [name], MD sent at 05/30/2019 10:25 AM EST ----- Please let him know that I will be starting morphine twice a day which she needs to take daily and to use Dilaudid for breakthrough pain medication.  He can use Dilaudid 4 mg instead of 2 I will send new prescriptions to his pharmacy.  It is important that he takes the morphine every day for it to work and use Dilaudid as breakthrough.  Thanks ----- Message ----- From: [name], RN Sent: 05/30/2019  10:13 AM EST To: [name], MD  Patient states he was in the ED yesterday for back pain and left leg pain. He was given IV Dilaudid in the ED and had some pain relief. He was given a prescription for Dilaudid 2 mg po to take at home for the pain but says it is not helping at all. He says it feels like his lower back is burning and the pain goes down his left leg. He says is in severe pain and wants to know if you can prescribe something different to help with the pain. He is scheduled for a Firmagon injection tomorrow. Next visit with you is 06/30/19. Thanks, Lanelle Bal

## 2019-05-30 NOTE — Telephone Encounter (Signed)
Called patient and made him aware of Dr. Hazeline Junker instructions. Patient verbalized understanding and is aware to contact the office with any new questions or concerns.

## 2019-05-31 ENCOUNTER — Other Ambulatory Visit: Payer: Self-pay

## 2019-05-31 ENCOUNTER — Inpatient Hospital Stay: Payer: Medicaid Other

## 2019-05-31 VITALS — BP 148/78 | HR 72 | Temp 98.2°F | Resp 18

## 2019-05-31 DIAGNOSIS — C61 Malignant neoplasm of prostate: Secondary | ICD-10-CM | POA: Diagnosis not present

## 2019-05-31 DIAGNOSIS — R339 Retention of urine, unspecified: Secondary | ICD-10-CM | POA: Diagnosis not present

## 2019-05-31 DIAGNOSIS — R338 Other retention of urine: Secondary | ICD-10-CM | POA: Diagnosis not present

## 2019-05-31 DIAGNOSIS — C7951 Secondary malignant neoplasm of bone: Secondary | ICD-10-CM | POA: Diagnosis not present

## 2019-05-31 DIAGNOSIS — C775 Secondary and unspecified malignant neoplasm of intrapelvic lymph nodes: Secondary | ICD-10-CM | POA: Diagnosis not present

## 2019-05-31 DIAGNOSIS — Z923 Personal history of irradiation: Secondary | ICD-10-CM | POA: Diagnosis not present

## 2019-05-31 DIAGNOSIS — Z79818 Long term (current) use of other agents affecting estrogen receptors and estrogen levels: Secondary | ICD-10-CM | POA: Diagnosis not present

## 2019-05-31 DIAGNOSIS — Z79899 Other long term (current) drug therapy: Secondary | ICD-10-CM | POA: Diagnosis not present

## 2019-05-31 MED ORDER — DEGARELIX ACETATE(240 MG DOSE) 120 MG/VIAL ~~LOC~~ SOLR
240.0000 mg | Freq: Once | SUBCUTANEOUS | Status: AC
  Administered 2019-05-31: 240 mg via SUBCUTANEOUS
  Filled 2019-05-31: qty 6

## 2019-05-31 NOTE — Patient Instructions (Signed)
Degarelix injection What is this medicine? DEGARELIX (deg a REL ix) is used to treat men with advanced prostate cancer. This medicine may be used for other purposes; ask your health care provider or pharmacist if you have questions. COMMON BRAND NAME(S): Degarelix, Firmagon What should I tell my health care provider before I take this medicine? They need to know if you have any of these conditions:  diabetes  heart disease  kidney disease  liver disease  low levels of potassium or magnesium in the blood  osteoporosis  an unusual or allergic reaction to degarelix, mannitol, other medicines, foods, dyes, or preservatives  pregnant or trying to get pregnant  breast-feeding How should I use this medicine? This medicine is for injection under the skin. It is usually given by a health care professional in a hospital or clinic setting. If you get this medicine at home, you will be taught how to prepare and give this medicine. Use exactly as directed. Take your medicine at regular intervals. Do not take it more often than directed. It is important that you put your used needles and syringes in a special sharps container. Do not put them in a trash can. If you do not have a sharps container, call your pharmacist or healthcare provider to get one. Talk to your pediatrician regarding the use of this medicine in children. Special care may be needed. Overdosage: If you think you have taken too much of this medicine contact a poison control center or emergency room at once. NOTE: This medicine is only for you. Do not share this medicine with others. What if I miss a dose? Try not to miss a dose. If you do miss a dose, call your doctor or health care professional for advice. What may interact with this medicine? Do not take this medicine with any of the following medications:  amiodarone  bretylium  disopyramide  droperidol  ibutilide  procainamide  quinidine  sotalol This  medicine may also interact with the following medications:  dofetilide This list may not describe all possible interactions. Give your health care provider a list of all the medicines, herbs, non-prescription drugs, or dietary supplements you use. Also tell them if you smoke, drink alcohol, or use illegal drugs. Some items may interact with your medicine. What should I watch for while using this medicine? Visit your doctor or health care professional for regular checks on your progress and discuss any issues before you start taking this medicine. Do not rub or scratch injection site. There may be a lump at the injection site, or it may be red or sore for a few days after your dose. Your doctor or health care professional will need to monitor your hormone levels in your blood to check your response to treatment. Try to keep any appointments for testing. What side effects may I notice from receiving this medicine? Side effects that you should report to your doctor or health care professional as soon as possible:  allergic reactions like skin rash, itching or hives, swelling of the face, lips, or tongue  fever or chills  irregular heartbeat  nausea and vomiting along with severe abdominal pain  pain or difficulty passing urine  pelvic pain or bloating  signs and symptoms of high blood sugar such as being more thirsty or hungry or having to urinate more than normal. You may also feel very tired or have blurry vision Side effects that usually do not require medical attention (report to your doctor or health   care professional if they continue or are bothersome):  change in sex drive or performance  constipation  headache  high blood pressure  hot flashes (flushing of skin, increased sweating)  itching, redness or mild pain at site where injected  joint pain  trouble sleeping  unusually weak or tired  weight gain This list may not describe all possible side effects. Call your  doctor for medical advice about side effects. You may report side effects to FDA at 1-800-FDA-1088. Where should I keep my medicine? Keep out of the reach of children. This drug is usually given in a hospital or clinic and will not be stored at home. In rare cases, this medicine may be given at home. If you are using this medicine at home, you will be instructed on how to store this medicine. Throw away any unused medicine after the expiration date on the label. NOTE: This sheet is a summary. It may not cover all possible information. If you have questions about this medicine, talk to your doctor, pharmacist, or health care provider.  2020 Elsevier/Gold Standard (2018-06-06 12:20:52)  

## 2019-06-06 ENCOUNTER — Telehealth: Payer: Self-pay

## 2019-06-07 ENCOUNTER — Other Ambulatory Visit: Payer: Self-pay

## 2019-06-07 ENCOUNTER — Encounter: Payer: Self-pay | Admitting: Urology

## 2019-06-07 ENCOUNTER — Telehealth: Payer: Self-pay

## 2019-06-07 ENCOUNTER — Ambulatory Visit
Admission: RE | Admit: 2019-06-07 | Discharge: 2019-06-07 | Disposition: A | Discharge status: Home / Self Care | Payer: Medicaid Other | Source: Ambulatory Visit | Attending: Urology | Admitting: Urology

## 2019-06-07 DIAGNOSIS — C7951 Secondary malignant neoplasm of bone: Secondary | ICD-10-CM

## 2019-06-07 MED ORDER — TAMSULOSIN HCL 0.4 MG PO CAPS: 0.4000 mg | ORAL_CAPSULE | Freq: Every day | ORAL | 3 refills | Status: DC

## 2019-06-07 NOTE — Telephone Encounter (Signed)
-----   Message from Andre Portela, MD sent at 06/07/2019  9:58 AM EST ----- I would advise him to continue morphine for the time being but if the symptoms worsen to report to [place] immediately.  I anticipate the side effects to improve but they do not, we might have to discontinue morphine.  Thanks ----- Message ----- From: [name], RN Sent: 06/07/2019   9:43 AM EST To: Andre Portela, MD  Patient called and states he started taking MS Contin 30 mg every 12 hours on Wednesday 2/24. Patient states he started having hallucinations, strange dreams, and panic attacks on Friday 2/26. Patient continued taking the medication even though he was having these symptoms and took a dose this morning with reported hallucinations. Patient has not been taking the Dilaudid because he said the Morphine was controlling his pain.  Lanelle Bal

## 2019-06-07 NOTE — Telephone Encounter (Signed)
Called patient and made him aware of Dr. Hazeline Junker instructions. Patient verbalized understanding and knows to call the office immediately if symptoms worsen.

## 2019-06-07 NOTE — Progress Notes (Signed)
Radiation Oncology         [phone] ________________________________  Name: Andre Glass MRN: [id number]  Date: 06/07/2019  DOB: 03/18/1951  Post Treatment Note  CC: Patient, No Pcp Per  Andre Glass  Diagnosis:   69 y.o. 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  Interval Since Last Radiation:  5 weeks  1/15-1/28/21:   The spine from T9-L1 was treated to 30 Gy in 10 fractions  Narrative:  I spoke with the patient to conduct his routine scheduled 1 month follow up visit via telephone to spare the patient unnecessary potential exposure in the healthcare setting during the current COVID-19 pandemic.  The patient was notified in advance and gave permission to proceed with this visit format. He tolerated radiation treatment relatively well.   His pain improved and neurologic function remained intact.  He has made the decision to proceed with ADT instead of orchiectomy and recieved Firmagon on 05/31/19 with plans to transition to Lupron 1 month later and consider treatment escalaton with Andre Glass or systemic chemotherapy in the future pending response to ADT as per discussion with Dr. Alen Glass at last visit on 05/24/19. He continues with indwelling foley catheter managed by Dr. Gloriann Glass and this has continued to drain appropriately. He does note an odor to his urine but denies gross hematuria, fever, chills or flank pain. He was recently evaluated in the ED on 05/29/19 due to poorly controlled back pain despite narcotic pain medications and repeat MRI thoracic and lumbar spine scans showed stable, known multifocal osseous metastatic disease with no significant epidural extension at the thoracolumbar levels, no cord compression. There was left sacral foraminal extension at S3 and no acute collapse deformity.  He was discharged home with oral Dilaudid and advised to discontinue the oxycodone since this  was no longer controlling his pain.    On review of systems, the patient states that he has continued with diffuse bone pain and is currently taking MS Contin twice daily, started on 05/31/19, and Dilaudid prn for breakthrough pain to manage his pain. This is providing good pain control but he reports hallucinations, panic attacks and strange dreams since starting the MS Contin. He was advised by Dr. Alen Glass to continue the MS Contin in hopes that the side effects will improve with time and he reports that the side effects are tolerable/manageable and he is pleased with the pain relief.  He does report some tingling in his lower extremities and a tight feeling in his feet bilaterally but denies any swelling, erythema or visible rash/irritation. He is also struggling with constipation but managing this with milk of magnesia currently.  Foley catheter continues to drain appropriately as above in HPI.   ALLERGIES:  is allergic to ibuprofen and shrimp [shellfish allergy].  Meds: Current Outpatient Medications  Medication Sig Dispense Refill  . acetaminophen (TYLENOL) 325 MG tablet Take 650 mg by mouth every 6 (six) hours as needed for mild pain.    . Artificial Saliva (BIOTENE ORALBALANCE DRY MOUTH) GEL Use as directed 1 Dose in the mouth or throat 4 (four) times daily -  before meals and at bedtime. 42 g 5  . bisacodyl (DULCOLAX) 10 MG suppository Place 1 suppository (10 mg total) rectally as needed for moderate constipation. 12 suppository 0  . docusate sodium (COLACE) 100 MG capsule Take 100 mg by mouth daily as needed for mild constipation or moderate  constipation.    Marland Kitchen HYDROmorphone (DILAUDID) 4 MG tablet Take 1 tablet (4 mg total) by mouth every 4 (four) hours as needed for severe pain. 60 tablet 0  . lactulose (CHRONULAC) 10 GM/15ML solution Take 15 mLs (10 g total) by mouth 2 (two) times daily as needed for moderate constipation. 236 mL 0  . morphine (MS CONTIN) 30 MG 12 hr tablet Take 1 tablet (30  mg total) by mouth every 12 (twelve) hours. 60 tablet 0  . oxybutynin (DITROPAN) 5 MG tablet Take 5 mg by mouth 3 (three) times daily.    . Pseudoephedrine HCl (SUDAFED) 30 MG/5ML LIQD Take 30 mg by mouth 4 (four) times daily as needed for congestion.    . senna (SENOKOT) 8.6 MG TABS tablet Take 1 tablet by mouth daily as needed for mild constipation.    . tamsulosin (FLOMAX) 0.4 MG CAPS capsule Take 1 capsule (0.4 mg total) by mouth daily. 30 capsule 0   No current facility-administered medications for this encounter.    Physical Findings:  vitals were not taken for this visit.   /10 Unable to assess due to telephone follow up visit format.  Lab Findings: Lab Results  Component Value Date   WBC 6.6 05/24/2019   HGB 10.1 (L) 05/24/2019   HCT 31.4 (L) 05/24/2019   MCV 90.0 05/24/2019   PLT 328 05/24/2019     Radiographic Findings: DG Lumbar Spine Complete  Result Date: 05/29/2019 CLINICAL DATA:  Onset low back pain with a burning sensation five days ago. History prostate cancer. No known injury. EXAM: LUMBAR SPINE - COMPLETE 4+ VIEW COMPARISON:  Whole-body bone scan 04/10/2019 and CT abdomen and pelvis 04/08/2019. FINDINGS: No fracture or malalignment is identified. Scattered sclerotic lesions consistent with known metastatic disease are again seen. Largest lesion is in the T12 vertebral body. Paraspinous structures are negative. IMPRESSION: No acute abnormality. Unchanged sclerotic lesions consistent with known metastatic prostate cancer. Electronically Signed   By: Andre Rise M.D.   On: 05/29/2019 08:42   MR THORACIC SPINE W WO CONTRAST  Result Date: 05/29/2019 CLINICAL DATA:  Metastatic prostate cancer EXAM: MRI THORACIC AND LUMBAR SPINE WITHOUT AND WITH CONTRAST TECHNIQUE: Multiplanar and multiecho pulse sequences of the thoracic and lumbar spine were obtained without and with intravenous contrast. CONTRAST:  68mL GADAVIST GADOBUTROL 1 MMOL/ML IV SOLN COMPARISON:  None.  FINDINGS: MRI THORACIC SPINE Alignment:  There is no significant listhesis. Vertebrae: There is no vertebral collapse deformity. Abnormal STIR hyperintensity and enhancement present at T10 and T12 vertebral bodies reflecting known metastatic disease. There is extension into the left pedicle at T12. No significant epidural disease. Cord:  Unremarkable. Paraspinal and other soft tissues: Unremarkable Disc levels: Mild multilevel degenerative disc disease and mild facet arthropathy. There is no high-grade degenerative stenosis. MRI LUMBAR SPINE FINDINGS Segmentation:  Standard. Alignment:  No significant listhesis. Vertebrae: Vertebral body heights are stable compared to prior abdominal CT. Small foci of low T1 and T2 signal reflecting sclerotic metastases are identified at all levels. Additionally, there are larger STIR hyperintense and enhancing metastatic lesions at L2 and L4. Sacral and iliac involvement is also noted. There is no significant lumbar epidural extension. There is left sacral foraminal extension at S2-S3 levels. Conus medullaris: Extends to the T12 level and appears normal. Paraspinal and other soft tissues: Bulky retroperitoneal adenopathy. Disc levels: L1-L2:  No canal or foraminal stenosis. L2-L3: Disc bulge eccentric to the left. No canal or right foraminal stenosis. Mild left foraminal stenosis. L3-L4:  Disc bulge with superimposed left foraminal protrusion. No canal stenosis. Mild right and mild to moderate left foraminal stenosis. Disc/osteophyte contacts the exiting left L3 nerve root. L4-L5: Disc bulge and facet arthropathy with ligamentum flavum infolding. No canal stenosis. Mild foraminal stenosis. L5-S1: Disc bulge with superimposed small central protrusion and facet arthropathy. No canal stenosis. Moderate right and mild left foraminal stenosis. IMPRESSION: Multifocal osseous metastatic disease. There is no significant epidural extension at thoracolumbar levels. There is left sacral  foraminal extension at S3. No acute collapse deformity. Electronically Signed   By: Macy Mis M.D.   On: 05/29/2019 14:15   MR Lumbar Spine W Wo Contrast  Result Date: 05/29/2019 CLINICAL DATA:  Metastatic prostate cancer EXAM: MRI THORACIC AND LUMBAR SPINE WITHOUT AND WITH CONTRAST TECHNIQUE: Multiplanar and multiecho pulse sequences of the thoracic and lumbar spine were obtained without and with intravenous contrast. CONTRAST:  71mL GADAVIST GADOBUTROL 1 MMOL/ML IV SOLN COMPARISON:  None. FINDINGS: MRI THORACIC SPINE Alignment:  There is no significant listhesis. Vertebrae: There is no vertebral collapse deformity. Abnormal STIR hyperintensity and enhancement present at T10 and T12 vertebral bodies reflecting known metastatic disease. There is extension into the left pedicle at T12. No significant epidural disease. Cord:  Unremarkable. Paraspinal and other soft tissues: Unremarkable Disc levels: Mild multilevel degenerative disc disease and mild facet arthropathy. There is no high-grade degenerative stenosis. MRI LUMBAR SPINE FINDINGS Segmentation:  Standard. Alignment:  No significant listhesis. Vertebrae: Vertebral body heights are stable compared to prior abdominal CT. Small foci of low T1 and T2 signal reflecting sclerotic metastases are identified at all levels. Additionally, there are larger STIR hyperintense and enhancing metastatic lesions at L2 and L4. Sacral and iliac involvement is also noted. There is no significant lumbar epidural extension. There is left sacral foraminal extension at S2-S3 levels. Conus medullaris: Extends to the T12 level and appears normal. Paraspinal and other soft tissues: Bulky retroperitoneal adenopathy. Disc levels: L1-L2:  No canal or foraminal stenosis. L2-L3: Disc bulge eccentric to the left. No canal or right foraminal stenosis. Mild left foraminal stenosis. L3-L4: Disc bulge with superimposed left foraminal protrusion. No canal stenosis. Mild right and mild to  moderate left foraminal stenosis. Disc/osteophyte contacts the exiting left L3 nerve root. L4-L5: Disc bulge and facet arthropathy with ligamentum flavum infolding. No canal stenosis. Mild foraminal stenosis. L5-S1: Disc bulge with superimposed small central protrusion and facet arthropathy. No canal stenosis. Moderate right and mild left foraminal stenosis. IMPRESSION: Multifocal osseous metastatic disease. There is no significant epidural extension at thoracolumbar levels. There is left sacral foraminal extension at S3. No acute collapse deformity. Electronically Signed   By: Macy Mis M.D.   On: 05/29/2019 14:15    Impression/Plan: 1. 69 y.o. 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 He appears to have recovered well from the effects of his recent palliative radiotherapy.  Unfortunately, he continues with significant, diffuse pain but has only recently started ADT and the hope is that his symptoms will improve with the ADT and potentially treatment escalation in the future.  We discussed that while we are happy to continue to participate in his care if clinically indicated, at this point, we will plan to see him back on an as-needed basis.  He will continue in routine follow-up under the care and direction of Dr. Alen Glass and his urologist, Dr. Gloriann Glass for continued management of his systemic disease.  If his pain  persists/worsens or there is evidence of bony disease progression despite systemic therapies, we would be more than happy to see him back in the clinic for consideration of further palliative radiotherapy if indicated.  We look forward to continuing to follow along in his progress and in the interim, he knows that he is welcome to call us with any questions or concerns related to his previous radiotherapy.  2. Urinary retention: This is ongoing since the time of diagnosis and is being managed with  indwelling foley catheter under the care and direction of Dr. Gloriann Glass. He has a follow up appointment with Dr. Gloriann Glass in 3 weeks and is anticipating a voiding trial at that time.  In preparation for the voiding trial, he requests a prescription for Flomax which I have sent and he will start taking immediately.  He understands that Dr. Gloriann Glass will determine the appropriate timing regarding voiding trial and will continue to manage his urinary issues.      Nicholos Johns, PA-C

## 2019-06-15 NOTE — Telephone Encounter (Signed)
Spoke with patient to remind of follow up phone encounter on 06/07/2019. Verbalized he understood

## 2019-06-18 ENCOUNTER — Other Ambulatory Visit: Payer: Self-pay

## 2019-06-18 ENCOUNTER — Emergency Department (HOSPITAL_COMMUNITY)
Admission: EM | Admit: 2019-06-18 | Discharge: 2019-06-18 | Disposition: A | Discharge status: Home / Self Care | Payer: Medicaid Other | Attending: Emergency Medicine | Admitting: Emergency Medicine

## 2019-06-18 ENCOUNTER — Emergency Department (HOSPITAL_COMMUNITY): Payer: Medicaid Other

## 2019-06-18 DIAGNOSIS — Z79899 Other long term (current) drug therapy: Secondary | ICD-10-CM | POA: Diagnosis not present

## 2019-06-18 DIAGNOSIS — T402X5A Adverse effect of other opioids, initial encounter: Secondary | ICD-10-CM | POA: Diagnosis not present

## 2019-06-18 DIAGNOSIS — Z8546 Personal history of malignant neoplasm of prostate: Secondary | ICD-10-CM | POA: Insufficient documentation

## 2019-06-18 DIAGNOSIS — K5909 Other constipation: Secondary | ICD-10-CM | POA: Diagnosis not present

## 2019-06-18 DIAGNOSIS — R52 Pain, unspecified: Secondary | ICD-10-CM | POA: Diagnosis not present

## 2019-06-18 DIAGNOSIS — R110 Nausea: Secondary | ICD-10-CM | POA: Diagnosis not present

## 2019-06-18 DIAGNOSIS — Z85830 Personal history of malignant neoplasm of bone: Secondary | ICD-10-CM | POA: Insufficient documentation

## 2019-06-18 DIAGNOSIS — K5903 Drug induced constipation: Secondary | ICD-10-CM | POA: Diagnosis not present

## 2019-06-18 DIAGNOSIS — R000 Tachycardia, unspecified: Secondary | ICD-10-CM | POA: Diagnosis not present

## 2019-06-18 DIAGNOSIS — R109 Unspecified abdominal pain: Secondary | ICD-10-CM | POA: Diagnosis present

## 2019-06-18 DIAGNOSIS — K5900 Constipation, unspecified: Secondary | ICD-10-CM | POA: Diagnosis not present

## 2019-06-18 DIAGNOSIS — I10 Essential (primary) hypertension: Secondary | ICD-10-CM | POA: Diagnosis not present

## 2019-06-18 LAB — COMPREHENSIVE METABOLIC PANEL
ALT: 13 U/L (ref 0–44)
AST: 21 U/L (ref 15–41)
Albumin: 3.4 g/dL — ABNORMAL LOW (ref 3.5–5.0)
Alkaline Phosphatase: 97 U/L (ref 38–126)
Anion gap: 8 (ref 5–15)
BUN: 14 mg/dL (ref 8–23)
CO2: 30 mmol/L (ref 22–32)
Calcium: 9.5 mg/dL (ref 8.9–10.3)
Chloride: 101 mmol/L (ref 98–111)
Creatinine, Ser: 1.24 mg/dL (ref 0.61–1.24)
GFR calc Af Amer: >60 mL/min (ref >60)
GFR calc non Af Amer: 59 mL/min — ABNORMAL LOW (ref >60)
Glucose, Bld: 136 mg/dL — ABNORMAL HIGH (ref 70–99)
Potassium: 4.3 mmol/L (ref 3.5–5.1)
Sodium: 139 mmol/L (ref 135–145)
Total Bilirubin: 0.7 mg/dL (ref 0.3–1.2)
Total Protein: 8.8 g/dL — ABNORMAL HIGH (ref 6.5–8.1)

## 2019-06-18 LAB — URINALYSIS, ROUTINE W REFLEX MICROSCOPIC
Bilirubin Urine: NEGATIVE
Glucose, UA: NEGATIVE mg/dL
Hgb urine dipstick: NEGATIVE
Ketones, ur: NEGATIVE mg/dL
Nitrite: POSITIVE — AB
Protein, ur: 30 mg/dL — AB
Specific Gravity, Urine: 1.013 (ref 1.005–1.030)
WBC, UA: >50 WBC/hpf — ABNORMAL HIGH (ref 0–5)
pH: 7 (ref 5.0–8.0)

## 2019-06-18 LAB — CBC WITH DIFFERENTIAL/PLATELET
Abs Immature Granulocytes: 0.02 10*3/uL (ref 0.00–0.07)
Basophils Absolute: 0 10*3/uL (ref 0.0–0.1)
Basophils Relative: 1 %
Eosinophils Absolute: 0.1 10*3/uL (ref 0.0–0.5)
Eosinophils Relative: 2 %
HCT: 32.5 % — ABNORMAL LOW (ref 39.0–52.0)
Hemoglobin: 10.1 g/dL — ABNORMAL LOW (ref 13.0–17.0)
Immature Granulocytes: 0 %
Lymphocytes Relative: 17 %
Lymphs Abs: 1 10*3/uL (ref 0.7–4.0)
MCH: 28.6 pg (ref 26.0–34.0)
MCHC: 31.1 g/dL (ref 30.0–36.0)
MCV: 92.1 fL (ref 80.0–100.0)
Monocytes Absolute: 0.4 10*3/uL (ref 0.1–1.0)
Monocytes Relative: 7 %
Neutro Abs: 4.5 10*3/uL (ref 1.7–7.7)
Neutrophils Relative %: 73 %
Platelets: 327 10*3/uL (ref 150–400)
RBC: 3.53 MIL/uL — ABNORMAL LOW (ref 4.22–5.81)
RDW: 14.7 % (ref 11.5–15.5)
WBC: 6.2 10*3/uL (ref 4.0–10.5)
nRBC: 0 % (ref 0.0–0.2)

## 2019-06-18 LAB — LIPASE, BLOOD: Lipase: 19 U/L (ref 11–51)

## 2019-06-18 MED ORDER — SODIUM CHLORIDE 0.9 % IV BOLUS
500.0000 mL | Freq: Once | INTRAVENOUS | Status: AC
  Administered 2019-06-18: 500 mL via INTRAVENOUS

## 2019-06-18 MED ORDER — MAGNESIUM CITRATE PO SOLN: 1.0000 | Freq: Once | ORAL | 0 refills | Status: AC

## 2019-06-18 MED ORDER — POLYETHYLENE GLYCOL 3350 17 G PO PACK: 17.0000 g | PACK | Freq: Every day | ORAL | 0 refills | Status: DC

## 2019-06-18 MED ORDER — SORBITOL 70 % SOLN
960.0000 mL | TOPICAL_OIL | Freq: Once | ORAL | Status: AC
  Administered 2019-06-18: 960 mL via RECTAL
  Filled 2019-06-18: qty 473

## 2019-06-18 MED ORDER — SODIUM CHLORIDE 0.9 % IV SOLN: INTRAVENOUS | Status: DC

## 2019-06-18 NOTE — ED Triage Notes (Signed)
Pt to ED with c.o of constipation for the past 5 days. Pt has recently been given pain medicine for prostate cancer and has had no relief with stool medicine given. Pt states has abdominal pain 4/10 in abdomen and tenderness.

## 2019-06-18 NOTE — ED Notes (Signed)
Assisted to bedside commode where pt had a medium bowel movement. Pt repositioned in bed and more enema inserted.

## 2019-06-18 NOTE — Discharge Instructions (Addendum)
Take the laxative medications as prescribed.  Make sure to drink plenty of fluids and increase fiber in your diet.  Follow up with your doctor to discuss chronic treatment of your constipation

## 2019-06-18 NOTE — ED Provider Notes (Signed)
[facility] DEPT Provider Note   CSN: [id number] Arrival date & time: 06/18/19  1557     History Chief Complaint  Patient presents with  . Constipation    Andre Glass is a 68 y.o. male.  HPI   Patient presented to ED for evaluation of abdominal pain and constipation.  Patient has history of prostate cancer.  He is taking opiate pain medications.  For the last 4 days he has not been able to have a bowel movement.  He did have an episode of nausea and vomiting.  He is also started having diffuse abdominal discomfort.  Feels bloated.  His abdomen is tender.  He has tried multiple medications for his constipation without relief.  He denies any fevers or chills.  Past Medical History:  Diagnosis Date  . Prostate cancer [facility])     Patient Active Problem List   Diagnosis Date Noted  . 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 Malignant neoplasm of prostate ([place]) 04/19/2019    No past surgical history on file.     Family History  Problem Relation Age of Onset  . Stomach cancer Mother   . Pancreatic cancer Sister   . Breast cancer Neg Hx   . Prostate cancer Neg Hx   . Colon cancer Neg Hx     Social History   Tobacco Use  . Smoking status: Never Smoker  . Smokeless tobacco: Never Used  Substance Use Topics  . Alcohol use: Never  . Drug use: Never    Home Medications Prior to Admission medications   Medication Sig Start Date End Date Taking? Authorizing Provider  acetaminophen (TYLENOL) 325 MG tablet Take 650 mg by mouth every 6 (six) hours as needed for mild pain.    [provider]  Artificial Saliva (BIOTENE ORALBALANCE DRY MOUTH) GEL Use as directed 1 Dose in the mouth or throat 4 (four) times daily -  before meals and at bedtime. 04/28/19   Tyler Pita, MD  bisacodyl (DULCOLAX) 10 MG suppository Place 1 suppository (10 mg total) rectally as needed for moderate constipation. 05/29/19   Lawyer, Harrell Gave, PA-C  docusate sodium  (COLACE) 100 MG capsule Take 100 mg by mouth daily as needed for mild constipation or moderate constipation.    [provider]  HYDROmorphone (DILAUDID) 4 MG tablet Take 1 tablet (4 mg total) by mouth every 4 (four) hours as needed for severe pain. 05/30/19   [name], MD  lactulose (CHRONULAC) 10 GM/15ML solution Take 15 mLs (10 g total) by mouth 2 (two) times daily as needed for moderate constipation. 05/29/19   Lawyer, Harrell Gave, PA-C  magnesium citrate SOLN Take 296 mLs (1 Bottle total) by mouth once for 1 dose. 06/18/19 06/18/19  Dorie Rank, MD  morphine (MS CONTIN) 30 MG 12 hr tablet Take 1 tablet (30 mg total) by mouth every 12 (twelve) hours. 05/30/19   [name], MD  oxybutynin (DITROPAN) 5 MG tablet Take 5 mg by mouth 3 (three) times daily.    [provider]  polyethylene glycol (MIRALAX) 17 g packet Take 17 g by mouth daily. 06/18/19   Dorie Rank, MD  Pseudoephedrine HCl (SUDAFED) 30 MG/5ML LIQD Take 30 mg by mouth 4 (four) times daily as needed for congestion.    [provider]  senna (SENOKOT) 8.6 MG TABS tablet Take 1 tablet by mouth daily as needed for mild constipation.    [provider]  tamsulosin (FLOMAX) 0.4 MG CAPS capsule  Take 1 capsule (0.4 mg total) by mouth daily. 02/21/19   Quintella Reichert, MD  tamsulosin (FLOMAX) 0.4 MG CAPS capsule Take 1 capsule (0.4 mg total) by mouth daily after supper. 06/07/19   Bruning, Ashlyn, PA-C    Allergies    Ibuprofen and Shrimp [shellfish allergy]  Review of Systems   Review of Systems  All other systems reviewed and are negative.   Physical Exam Updated Vital Signs BP (!) 184/84   Pulse 64   Temp 98.1 F (36.7 C) (Oral)   Resp 18   SpO2 100%   Physical Exam Vitals and nursing note reviewed.  Constitutional:      General: He is not in acute distress.    Appearance: He is well-developed.  HENT:     Head: Normocephalic and atraumatic.     Right Ear: External ear normal.      Left Ear: External ear normal.  Eyes:     General: No scleral icterus.       Right eye: No discharge.        Left eye: No discharge.     Conjunctiva/sclera: Conjunctivae normal.  Neck:     Trachea: No tracheal deviation.  Cardiovascular:     Rate and Rhythm: Normal rate and regular rhythm.  Pulmonary:     Effort: Pulmonary effort is normal. No respiratory distress.     Breath sounds: Normal breath sounds. No stridor. No wheezing or rales.  Abdominal:     General: Bowel sounds are normal. There is no distension.     Palpations: Abdomen is soft.     Tenderness: There is generalized abdominal tenderness. There is no guarding or rebound.  Genitourinary:    Comments: No fecal impaction noted on rectal exam  Musculoskeletal:        General: No tenderness.     Cervical back: Neck supple.  Skin:    General: Skin is warm and dry.     Findings: No rash.  Neurological:     Mental Status: He is alert.     Cranial Nerves: No cranial nerve deficit (no facial droop, extraocular movements intact, no slurred speech).     Sensory: No sensory deficit.     Motor: No abnormal muscle tone or seizure activity.     Coordination: Coordination normal.     ED Results / Procedures / Treatments   Labs (all labs ordered are listed, but only abnormal results are displayed) Labs Reviewed  COMPREHENSIVE METABOLIC PANEL - Abnormal; Notable for the following components:      Result Value   Glucose, Bld 136 (*)    Total Protein 8.8 (*)    Albumin 3.4 (*)    GFR calc non Af Amer 59 (*)    All other components within normal limits  CBC WITH DIFFERENTIAL/PLATELET - Abnormal; Notable for the following components:   RBC 3.53 (*)    Hemoglobin 10.1 (*)    HCT 32.5 (*)    All other components within normal limits  URINALYSIS, ROUTINE W REFLEX MICROSCOPIC - Abnormal; Notable for the following components:   Protein, ur 30 (*)    Nitrite POSITIVE (*)    Leukocytes,Ua LARGE (*)    WBC, UA >50 (*)     Bacteria, UA MANY (*)    All other components within normal limits  LIPASE, BLOOD    EKG None  Radiology DG Abd Acute W/Chest  Result Date: 06/18/2019 CLINICAL DATA:  Constipation x5 days. EXAM: DG ABDOMEN ACUTE W/ 1V CHEST  COMPARISON:  Aug 09, 2006 FINDINGS: There is no evidence of dilated bowel loops or free intraperitoneal air. A large amount of stool is seen throughout the colon. No radiopaque calculi or other significant radiographic abnormality is seen. Heart size and mediastinal contours are within normal limits. Both lungs are clear. IMPRESSION: Large stool burden without evidence of bowel obstruction. Electronically Signed   By: Virgina Norfolk M.D.   On: 06/18/2019 19:17    Procedures Procedures (including critical care time)  Medications Ordered in ED Medications  sodium chloride 0.9 % bolus 500 mL (0 mLs Intravenous Stopped 06/18/19 1920)    And  0.9 %  sodium chloride infusion ( Intravenous New Bag/Given 06/18/19 1922)  sorbitol, milk of mag, mineral oil, glycerin (SMOG) enema (960 mLs Rectal Given 06/18/19 2051)    ED Course  I have reviewed the triage vital signs and the nursing notes.  Pertinent labs & imaging results that were available during my care of the patient were reviewed by me and considered in my medical decision making (see chart for details).  Clinical Course as of Jun 17 2140  Nancy Fetter Jun 18, 2019  1941 X-ray shows large stool burden without obstruction.  Laboratory tests are otherwise unremarkable.   [JK]  1942 Anemia is stable   [JK]  1943 Proceed with enema   [JK]    Clinical Course User Index [JK] Dorie Rank, MD   MDM Rules/Calculators/A&P                      Patient presented to ED for evaluation of constipation.  Patient's laboratory tests are reassuring.  X-rays are consistent with constipation and not obstruction.  Patient is on opiate pain medications and I suspect this is the causative factor for his constipation.  Patient was treated  with enema.  Will discharge home with MiraLAX and magnesium citrate.  Discussed outpt follow up with pcp Final Clinical Impression(s) / ED Diagnoses Final diagnoses:  Constipation due to opioid therapy    Rx / DC Orders ED Discharge Orders         Ordered    polyethylene glycol (MIRALAX) 17 g packet  Daily     06/18/19 2141    magnesium citrate SOLN   Once     06/18/19 2141           Dorie Rank, MD 06/18/19 2210

## 2019-06-21 DIAGNOSIS — Z79810 Long term (current) use of selective estrogen receptor modulators (SERMs): Secondary | ICD-10-CM | POA: Diagnosis not present

## 2019-06-21 DIAGNOSIS — I10 Essential (primary) hypertension: Secondary | ICD-10-CM | POA: Diagnosis not present

## 2019-06-21 DIAGNOSIS — M6281 Muscle weakness (generalized): Secondary | ICD-10-CM | POA: Diagnosis not present

## 2019-06-21 DIAGNOSIS — C61 Malignant neoplasm of prostate: Secondary | ICD-10-CM | POA: Diagnosis not present

## 2019-06-26 ENCOUNTER — Other Ambulatory Visit: Payer: Self-pay | Admitting: Family Medicine

## 2019-06-26 ENCOUNTER — Ambulatory Visit (INDEPENDENT_AMBULATORY_CARE_PROVIDER_SITE_OTHER): Payer: Medicaid Other | Admitting: Family Medicine

## 2019-06-26 ENCOUNTER — Other Ambulatory Visit: Payer: Self-pay

## 2019-06-26 ENCOUNTER — Encounter: Payer: Self-pay | Admitting: Family Medicine

## 2019-06-26 VITALS — BP 142/70 | HR 68 | Wt 194.4 lb

## 2019-06-26 DIAGNOSIS — Z1159 Encounter for screening for other viral diseases: Secondary | ICD-10-CM | POA: Diagnosis not present

## 2019-06-26 DIAGNOSIS — Z131 Encounter for screening for diabetes mellitus: Secondary | ICD-10-CM

## 2019-06-26 DIAGNOSIS — Z1211 Encounter for screening for malignant neoplasm of colon: Secondary | ICD-10-CM | POA: Diagnosis not present

## 2019-06-26 DIAGNOSIS — Z7689 Persons encountering health services in other specified circumstances: Secondary | ICD-10-CM

## 2019-06-26 DIAGNOSIS — Z7189 Other specified counseling: Secondary | ICD-10-CM | POA: Insufficient documentation

## 2019-06-26 MED ORDER — TETANUS-DIPHTH-ACELL PERTUSSIS 5-2.5-18.5 LF-MCG/0.5 IM SUSP: 0.5000 mL | Freq: Once | INTRAMUSCULAR | 0 refills | Status: AC

## 2019-06-26 MED ORDER — LACTULOSE 10 GM/15ML PO SOLN: 10.0000 g | Freq: Two times a day (BID) | ORAL | 0 refills | Status: DC | PRN

## 2019-06-26 NOTE — Assessment & Plan Note (Addendum)
-  TDAP sent to pharmacy -Discussed pneumonia shot with patient, patient will discuss with spouse PNA shot and get back to [place]. - Hep C screening today. - Will check A1C today

## 2019-06-26 NOTE — Patient Instructions (Addendum)
It was a pleasure to meet you today.  Our plans for today:  -We discussed your past medical history and current medications -I recommend considering the pneumonia shot.  After discussion today the plan was for you to think about it and let us know if you would like it. -We are also checking an A1c for diabetes screening, hepatitis C, and I am providing a referral for colonoscopy.  They should call you to schedule the colonoscopy in the next 1-2 weeks.  This can be scheduled at your convenience. -For your blood pressure since your blood pressure cuff is reading about 10 points above ours I recommend that you either get years calibrated if possible or consider purchasing a new blood pressure cuff.  Because your blood pressure is slightly elevated in the clinic today I would like for you to check it at least 1-2 times per day for the next week, if you would like you can send me these numbers or we can take a look at them the next time you come in.  We are checking some labs today, we will call you or send you a letter if they are abnormal.   Take care and seek immediate care sooner if you develop any concerns.   Dr. [facility]

## 2019-06-26 NOTE — Progress Notes (Signed)
Pharmacist Chemotherapy Monitoring - Follow Up Assessment    I verify that I have reviewed each item in the below checklist:  . Regimen for the patient is scheduled for the appropriate day and plan matches scheduled date. Marland Kitchen Appropriate non-routine labs are ordered dependent on drug ordered. . If applicable, additional medications reviewed and ordered per protocol based on lifetime cumulative doses and/or treatment regimen.   Plan for follow-up and/or issues identified: No . I-vent associated with next due treatment: No . MD and/or nursing notified: No  Andre Glass 06/26/2019 10:13 AM

## 2019-06-26 NOTE — Progress Notes (Signed)
   Subjective:    [name], male    DOB: [birth year], 69 y.o.   MRN: [id number]   CC: Encounter to establish care  HPI:  Andre Glass is a very pleasant 69 year old gentleman that presents today to establish care.  Hx prostate cancer diagnosed Nov, on treatment currently.  Patient states he expects to get more information on Friday on current treatment progress when he has his follow-up appointment with his oncologist.  Having some issues with constipation. Still working on getting a bowel regemin in place. Having ~1BM per week.  Patient does have a history of BPH and is on Flomax as well as uses urinary catheter that is managed and prescribed by urology.  FM - Mother stomach cancer, father heart attack at 83 and emphysema.  Sister wit history pancreatic cancer.  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  Smoking status - Never  Objective:  BP (!) 142/70   Pulse 68   Wt 194 lb 6.4 oz (88.2 kg)   SpO2 100%   BMI 29.56 kg/m   130/74 on recheck  Vitals and nursing note reviewed  General: NAD, pleasant, able to participate in exam Cardiac: RRR, no murmurs. Respiratory: CTAB, normal effort Extremities: no edema or cyanosis. Skin: warm and dry, no rashes noted Neuro: alert, no obvious focal deficits Psych: Normal affect and mood   Assessment & Plan:   Establishing care with new doctor, encounter for -TDAP sent to pharmacy - Will discuss PNA shot and get back to [place]. - Hep C screening today. - Will check A1C today  Blood pressure 130/74 on recheck  [name], [facility] PGY-1

## 2019-06-27 LAB — HEMOGLOBIN A1C
Est. average glucose Bld gHb Est-mCnc: 123 mg/dL
Hgb A1c MFr Bld: 5.9 % — ABNORMAL HIGH (ref 4.8–5.6)

## 2019-06-27 LAB — HEPATITIS C ANTIBODY: Hep C Virus Ab: <0.1 s/co ratio (ref 0.0–0.9)

## 2019-06-30 ENCOUNTER — Inpatient Hospital Stay (HOSPITAL_BASED_OUTPATIENT_CLINIC_OR_DEPARTMENT_OTHER): Payer: Medicaid Other | Admitting: Oncology

## 2019-06-30 ENCOUNTER — Inpatient Hospital Stay: Payer: Medicaid Other

## 2019-06-30 ENCOUNTER — Inpatient Hospital Stay: Payer: Medicaid Other | Attending: Oncology

## 2019-06-30 ENCOUNTER — Other Ambulatory Visit: Payer: Self-pay

## 2019-06-30 ENCOUNTER — Telehealth: Payer: Self-pay

## 2019-06-30 VITALS — BP 168/72 | HR 54 | Temp 98.9°F | Resp 18 | Ht 68.0 in | Wt 191.3 lb

## 2019-06-30 DIAGNOSIS — Z923 Personal history of irradiation: Secondary | ICD-10-CM | POA: Diagnosis not present

## 2019-06-30 DIAGNOSIS — R339 Retention of urine, unspecified: Secondary | ICD-10-CM | POA: Insufficient documentation

## 2019-06-30 DIAGNOSIS — C61 Malignant neoplasm of prostate: Secondary | ICD-10-CM

## 2019-06-30 DIAGNOSIS — R590 Localized enlarged lymph nodes: Secondary | ICD-10-CM | POA: Insufficient documentation

## 2019-06-30 DIAGNOSIS — Z79899 Other long term (current) drug therapy: Secondary | ICD-10-CM | POA: Diagnosis not present

## 2019-06-30 DIAGNOSIS — C7951 Secondary malignant neoplasm of bone: Secondary | ICD-10-CM | POA: Diagnosis not present

## 2019-06-30 DIAGNOSIS — Z79818 Long term (current) use of other agents affecting estrogen receptors and estrogen levels: Secondary | ICD-10-CM | POA: Diagnosis not present

## 2019-06-30 DIAGNOSIS — G893 Neoplasm related pain (acute) (chronic): Secondary | ICD-10-CM | POA: Diagnosis not present

## 2019-06-30 DIAGNOSIS — K5900 Constipation, unspecified: Secondary | ICD-10-CM | POA: Diagnosis not present

## 2019-06-30 LAB — CMP (CANCER CENTER ONLY)
ALT: 8 U/L (ref 0–44)
AST: 13 U/L — ABNORMAL LOW (ref 15–41)
Albumin: 2.9 g/dL — ABNORMAL LOW (ref 3.5–5.0)
Alkaline Phosphatase: 103 U/L (ref 38–126)
Anion gap: 5 (ref 5–15)
BUN: 15 mg/dL (ref 8–23)
CO2: 28 mmol/L (ref 22–32)
Calcium: 9.1 mg/dL (ref 8.9–10.3)
Chloride: 106 mmol/L (ref 98–111)
Creatinine: 0.93 mg/dL (ref 0.61–1.24)
GFR, Est AFR Am: >60 mL/min (ref >60)
GFR, Estimated: >60 mL/min (ref >60)
Glucose, Bld: 105 mg/dL — ABNORMAL HIGH (ref 70–99)
Potassium: 4.1 mmol/L (ref 3.5–5.1)
Sodium: 139 mmol/L (ref 135–145)
Total Bilirubin: 0.3 mg/dL (ref 0.3–1.2)
Total Protein: 7.8 g/dL (ref 6.5–8.1)

## 2019-06-30 LAB — CBC WITH DIFFERENTIAL (CANCER CENTER ONLY)
Abs Immature Granulocytes: 0.02 10*3/uL (ref 0.00–0.07)
Basophils Absolute: 0 10*3/uL (ref 0.0–0.1)
Basophils Relative: 0 %
Eosinophils Absolute: 0.2 10*3/uL (ref 0.0–0.5)
Eosinophils Relative: 4 %
HCT: 32.2 % — ABNORMAL LOW (ref 39.0–52.0)
Hemoglobin: 10.1 g/dL — ABNORMAL LOW (ref 13.0–17.0)
Immature Granulocytes: 0 %
Lymphocytes Relative: 34 %
Lymphs Abs: 1.7 10*3/uL (ref 0.7–4.0)
MCH: 28.1 pg (ref 26.0–34.0)
MCHC: 31.4 g/dL (ref 30.0–36.0)
MCV: 89.7 fL (ref 80.0–100.0)
Monocytes Absolute: 0.4 10*3/uL (ref 0.1–1.0)
Monocytes Relative: 7 %
Neutro Abs: 2.8 10*3/uL (ref 1.7–7.7)
Neutrophils Relative %: 55 %
Platelet Count: 212 10*3/uL (ref 150–400)
RBC: 3.59 MIL/uL — ABNORMAL LOW (ref 4.22–5.81)
RDW: 15.8 % — ABNORMAL HIGH (ref 11.5–15.5)
WBC Count: 5.1 10*3/uL (ref 4.0–10.5)
nRBC: 0 % (ref 0.0–0.2)

## 2019-06-30 MED ORDER — DEGARELIX ACETATE 80 MG ~~LOC~~ SOLR
80.0000 mg | Freq: Once | SUBCUTANEOUS | Status: AC
  Administered 2019-06-30: 80 mg via SUBCUTANEOUS
  Filled 2019-06-30: qty 4

## 2019-06-30 NOTE — Progress Notes (Signed)
Home medical equipment prescription sent to [facility] (fax [phone]) for walker and shower seat. Received confirmation of fax receipt.

## 2019-06-30 NOTE — Progress Notes (Signed)
Hematology and Oncology Follow Up Visit  Andre Glass [id number] [birth year] 69 y.o. 06/30/2019 11:40 AM [name], Emelda Brothers, Desiree Hane, MD   Principle Diagnosis: 69 year old with advanced prostate cancer with disease to the bone presented with Gleason score 4+5 = 9 and a PSA of 366 in December 2020.   Prior Therapy: He is status post prostate biopsy completed on December 3 of 2020.  Pathology showed a Gleason score 4+5 = 9 with 12 out of 12 cores.  He is status post both of radiation therapy to the spine between T9 and L1.  He received 30 Gray in 10 fractions between January 15 and 28, 2021.   Current therapy: Firmagon 240 mg started on May 31, 2019.    Interim History: Andre Glass is here for a follow-up visit. Since the last visit, he started androgen deprivation therapy with [name] without any complications.  He denies any nausea, hot flashes or excessive fatigue.  He does report improvement in his bone pain overall.  He has been on morphine although did not take 1 today and has not required any breakthrough pain medication.  He does report constipation which is improved at this time with laxatives.     Medications: Reviewed without changes. Current Outpatient Medications  Medication Sig Dispense Refill  . acetaminophen (TYLENOL) 325 MG tablet Take 650 mg by mouth every 6 (six) hours as needed for mild pain.    . Artificial Saliva (BIOTENE ORALBALANCE DRY MOUTH) GEL Use as directed 1 Dose in the mouth or throat 4 (four) times daily -  before meals and at bedtime. 42 g 5  . bisacodyl (DULCOLAX) 10 MG suppository Place 1 suppository (10 mg total) rectally as needed for moderate constipation. 12 suppository 0  . docusate sodium (COLACE) 100 MG capsule Take 100 mg by mouth daily as needed for mild constipation or moderate constipation.    Marland Kitchen HYDROmorphone (DILAUDID) 4 MG tablet Take 1 tablet (4 mg total) by mouth every 4 (four) hours as needed for severe pain. 60 tablet 0  .  lactulose (CHRONULAC) 10 GM/15ML solution Take 15 mLs (10 g total) by mouth 2 (two) times daily as needed for moderate constipation. 236 mL 0  . morphine (MS CONTIN) 30 MG 12 hr tablet Take 1 tablet (30 mg total) by mouth every 12 (twelve) hours. 60 tablet 0  . oxybutynin (DITROPAN) 5 MG tablet Take 5 mg by mouth 3 (three) times daily.    . polyethylene glycol (MIRALAX) 17 g packet Take 17 g by mouth daily. 14 each 0  . Pseudoephedrine HCl (SUDAFED) 30 MG/5ML LIQD Take 30 mg by mouth 4 (four) times daily as needed for congestion.    . senna (SENOKOT) 8.6 MG TABS tablet Take 1 tablet by mouth daily as needed for mild constipation.    . tamsulosin (FLOMAX) 0.4 MG CAPS capsule Take 1 capsule (0.4 mg total) by mouth daily. 30 capsule 0  . tamsulosin (FLOMAX) 0.4 MG CAPS capsule Take 1 capsule (0.4 mg total) by mouth daily after supper. 30 capsule 3   No current facility-administered medications for this visit.     Allergies:  Allergies  Allergen Reactions  . Ibuprofen Anaphylaxis  . Shrimp [Shellfish Allergy] Anaphylaxis       Physical Exam:  Blood pressure (!) 168/72, pulse (!) 54, temperature 98.9 F (37.2 C), temperature source Temporal, resp. rate 18, height 5\' 8"  (1.727 m), weight 191 lb 4.8 oz (86.8 kg), SpO2 100 %.  ECOG: 1    General appearance: Comfortable appearing without any discomfort Head: Normocephalic without any trauma Oropharynx: Mucous membranes are moist and pink without any thrush or ulcers. Eyes: Pupils are equal and round reactive to light. Lymph nodes: No cervical, supraclavicular, inguinal or axillary lymphadenopathy.   Heart:regular rate and rhythm.  S1 and S2 without leg edema. Lung: Clear without any rhonchi or wheezes.  No dullness to percussion. Abdomin: Soft, nontender, nondistended with good bowel sounds.  No hepatosplenomegaly. Musculoskeletal: No joint deformity or effusion.  Full range of motion noted. Neurological: No deficits noted on  motor, sensory and deep tendon reflex exam. Skin: No petechial rash or dryness.  Appeared moist.          Lab Results: Lab Results  Component Value Date   WBC 5.1 06/30/2019   HGB 10.1 (L) 06/30/2019   HCT 32.2 (L) 06/30/2019   MCV 89.7 06/30/2019   PLT 212 06/30/2019     Chemistry      Component Value Date/Time   NA 139 06/18/2019 1843   K 4.3 06/18/2019 1843   CL 101 06/18/2019 1843   CO2 30 06/18/2019 1843   BUN 14 06/18/2019 1843   CREATININE 1.24 06/18/2019 1843   CREATININE 1.52 (H) 05/24/2019 1445      Component Value Date/Time   CALCIUM 9.5 06/18/2019 1843   ALKPHOS 97 06/18/2019 1843   AST 21 06/18/2019 1843   AST 15 05/24/2019 1445   ALT 13 06/18/2019 1843   ALT 8 05/24/2019 1445   BILITOT 0.7 06/18/2019 1843   BILITOT 0.4 05/24/2019 1445       Impression and Plan:   69 year old man with:  1.   Advanced prostate cancer with disease to the bone and lymphadenopathy. He has castration-sensitive disease at this time.   The natural course of this disease was reviewed and alternative treatment options were discussed. Continue with androgen deprivation therapy alone versus adding additional therapy were reviewed. There is no question he will benefit from adding Zytiga, Xtandi or systemic chemotherapy at this time.  This will be deferred to a later date once he is continued on androgen deprivation.  2.  Androgen deprivation therapy: He is currently on Firmagon on a monthly basis and will continue for the time being. He will be switched to Eligard in the future.  3.  Bone directed therapy: Currently on calcium and vitamin D supplements. Delton See will be deferred for the time being.  4.  Bone pain: He is on morphine and Dilaudid.  He has not been using any Dilaudid at this time although have not used morphine either today.  I recommended continue with short acting pain medication for the time being and will go back to morphine if needed in the  future.    5.  Urinary retention: He continues to follow with Dr. Sande Brothers regarding this issue.  6.  Constipation: He is currently on MiraLAX as well as Senokot-S which is helping with daily bowel movement.  7.  Follow-up: In 4 weeks for repeat evaluation.    30  minutes were spent on this encounter. The time was dedicated to reviewing his disease status, treatment options and addressing complications related to therapy.   Zola Button, MD 3/26/202111:40 AM

## 2019-06-30 NOTE — Addendum Note (Signed)
Addended by: Reyne Dumas E on: 06/30/2019 12:44 PM   Modules accepted: Orders

## 2019-06-30 NOTE — Telephone Encounter (Signed)
Received phone call from [facility] requesting patient insurance information and a copy of the last office note.  Information faxed to [phone]. Confirmation received.

## 2019-06-30 NOTE — Patient Instructions (Signed)
Degarelix injection What is this medicine? DEGARELIX (deg a REL ix) is used to treat men with advanced prostate cancer. This medicine may be used for other purposes; ask your health care provider or pharmacist if you have questions. COMMON BRAND NAME(S): Degarelix, Firmagon What should I tell my health care provider before I take this medicine? They need to know if you have any of these conditions:  diabetes  heart disease  kidney disease  liver disease  low levels of potassium or magnesium in the blood  osteoporosis  an unusual or allergic reaction to degarelix, mannitol, other medicines, foods, dyes, or preservatives  pregnant or trying to get pregnant  breast-feeding How should I use this medicine? This medicine is for injection under the skin. It is usually given by a health care professional in a hospital or clinic setting. If you get this medicine at home, you will be taught how to prepare and give this medicine. Use exactly as directed. Take your medicine at regular intervals. Do not take it more often than directed. It is important that you put your used needles and syringes in a special sharps container. Do not put them in a trash can. If you do not have a sharps container, call your pharmacist or healthcare provider to get one. Talk to your pediatrician regarding the use of this medicine in children. Special care may be needed. Overdosage: If you think you have taken too much of this medicine contact a poison control center or emergency room at once. NOTE: This medicine is only for you. Do not share this medicine with others. What if I miss a dose? Try not to miss a dose. If you do miss a dose, call your doctor or health care professional for advice. What may interact with this medicine? Do not take this medicine with any of the following medications:  amiodarone  bretylium  disopyramide  droperidol  ibutilide  procainamide  quinidine  sotalol This  medicine may also interact with the following medications:  dofetilide This list may not describe all possible interactions. Give your health care provider a list of all the medicines, herbs, non-prescription drugs, or dietary supplements you use. Also tell them if you smoke, drink alcohol, or use illegal drugs. Some items may interact with your medicine. What should I watch for while using this medicine? Visit your doctor or health care professional for regular checks on your progress and discuss any issues before you start taking this medicine. Do not rub or scratch injection site. There may be a lump at the injection site, or it may be red or sore for a few days after your dose. Your doctor or health care professional will need to monitor your hormone levels in your blood to check your response to treatment. Try to keep any appointments for testing. What side effects may I notice from receiving this medicine? Side effects that you should report to your doctor or health care professional as soon as possible:  allergic reactions like skin rash, itching or hives, swelling of the face, lips, or tongue  fever or chills  irregular heartbeat  nausea and vomiting along with severe abdominal pain  pain or difficulty passing urine  pelvic pain or bloating  signs and symptoms of high blood sugar such as being more thirsty or hungry or having to urinate more than normal. You may also feel very tired or have blurry vision Side effects that usually do not require medical attention (report to your doctor or health   care professional if they continue or are bothersome):  change in sex drive or performance  constipation  headache  high blood pressure  hot flashes (flushing of skin, increased sweating)  itching, redness or mild pain at site where injected  joint pain  trouble sleeping  unusually weak or tired  weight gain This list may not describe all possible side effects. Call your  doctor for medical advice about side effects. You may report side effects to FDA at 1-800-FDA-1088. Where should I keep my medicine? Keep out of the reach of children. This drug is usually given in a hospital or clinic and will not be stored at home. In rare cases, this medicine may be given at home. If you are using this medicine at home, you will be instructed on how to store this medicine. Throw away any unused medicine after the expiration date on the label. NOTE: This sheet is a summary. It may not cover all possible information. If you have questions about this medicine, talk to your doctor, pharmacist, or health care provider.  2020 Elsevier/Gold Standard (2018-06-06 12:20:52)  

## 2019-07-01 LAB — PROSTATE-SPECIFIC AG, SERUM (LABCORP): Prostate Specific Ag, Serum: 121 ng/mL — ABNORMAL HIGH (ref 0.0–4.0)

## 2019-07-03 ENCOUNTER — Telehealth: Payer: Self-pay | Admitting: Oncology

## 2019-07-03 ENCOUNTER — Telehealth: Payer: Self-pay

## 2019-07-03 ENCOUNTER — Other Ambulatory Visit: Payer: Self-pay | Admitting: Oncology

## 2019-07-03 MED ORDER — HYDROMORPHONE HCL 4 MG PO TABS: 4.0000 mg | ORAL_TABLET | ORAL | 0 refills | Status: DC | PRN

## 2019-07-03 MED ORDER — MORPHINE SULFATE ER 30 MG PO TBCR: 30.0000 mg | EXTENDED_RELEASE_TABLET | Freq: Two times a day (BID) | ORAL | 0 refills | Status: DC

## 2019-07-03 NOTE — Telephone Encounter (Signed)
Called and made patient aware of Dr. Hazeline Junker instructions below. Patient verbalized understanding.

## 2019-07-03 NOTE — Telephone Encounter (Signed)
-----   Message from [name], MD sent at 07/03/2019  9:33 AM EDT ----- Please tell him to restart morphine and I will send a new prescription to pick up.  He continues to use Dilaudid as needed.  All his symptoms are likely related to withdrawal from the morphine. ----- Message ----- From: [name], RN Sent: 07/03/2019   9:15 AM EDT To: [name], MD  Patient called and c/o 9/10 pain in abdomen, back, and pelvic area. He said Dilaudid is not working and he has been up all night due to the pain. Per patient he tried doubling up on the Dilaudid a few times and still had pain. Patient states pain is worse since he stopped Morphine. Patient also reports moderate nausea since he got his [place] shot on Friday.  Lanelle Bal

## 2019-07-03 NOTE — Telephone Encounter (Signed)
Scheduled appt per 3/26 los.  Sent a message to HIM pool to get a calendar mailed out. 

## 2019-07-05 ENCOUNTER — Other Ambulatory Visit: Payer: Self-pay

## 2019-07-06 ENCOUNTER — Telehealth: Payer: Self-pay | Admitting: Family Medicine

## 2019-07-06 ENCOUNTER — Other Ambulatory Visit: Payer: Self-pay | Admitting: Family Medicine

## 2019-07-06 MED ORDER — MIRABEGRON ER 25 MG PO TB24: 25.0000 mg | ORAL_TABLET | Freq: Every day | ORAL | 0 refills | Status: DC

## 2019-07-06 MED ORDER — LACTULOSE 10 GM/15ML PO SOLN: 10.0000 g | Freq: Two times a day (BID) | ORAL | 0 refills | Status: DC | PRN

## 2019-07-06 NOTE — Telephone Encounter (Signed)
Pt has called several times and no answer from the nurse line. Pt need medication refill on two meds. Pls call [name] at [phone] or pt at [phone]

## 2019-07-06 NOTE — Progress Notes (Signed)
Called patient after his refill request for oxybutynin. I spoke with patient using shared decision making to discuss my concerns with this medication as it is a BEERs medication. Patient understands the fall risk and would like to avoid this. I explained that there is an alternative we can try called myrbetriq, but that this medication can sometimes be expensive. Patient states the oxybutynin works well for him but he would be willing to try the myrbetriq if he can afford it.  Plan: - Will call in myrbetriq for patient. Should his insurance not cover this medication or should it be too expensive patient plans to call or message me for a refill of his oxybutynin. - Should patient decide for the refill of his oxybutynin he understands that there is a relative contraindication for this medication in his age group and that it can increase his fall risk.

## 2019-07-06 NOTE — Telephone Encounter (Signed)
Patient has called again for a refill of medication.  Kylil Swopes,CMA

## 2019-07-06 NOTE — Telephone Encounter (Signed)
Refill request sent to provider.  There is a note from nurse clinic from yesterday.  We are waiting to hear from PCP.  Doreena Maulden,CMA

## 2019-07-09 DIAGNOSIS — C61 Malignant neoplasm of prostate: Secondary | ICD-10-CM | POA: Diagnosis not present

## 2019-07-11 DIAGNOSIS — C61 Malignant neoplasm of prostate: Secondary | ICD-10-CM | POA: Diagnosis not present

## 2019-07-12 DIAGNOSIS — C61 Malignant neoplasm of prostate: Secondary | ICD-10-CM | POA: Diagnosis not present

## 2019-07-13 ENCOUNTER — Telehealth: Payer: Self-pay

## 2019-07-13 ENCOUNTER — Ambulatory Visit: Payer: Medicaid Other | Admitting: Family Medicine

## 2019-07-13 DIAGNOSIS — C61 Malignant neoplasm of prostate: Secondary | ICD-10-CM | POA: Diagnosis not present

## 2019-07-13 NOTE — Telephone Encounter (Signed)
Called patient and let him know Dr. Hazeline Junker response to his concerns. Patient verbalized understanding and will contact his PCP.

## 2019-07-13 NOTE — Progress Notes (Deleted)
   Subjective:    [name], male    DOB: 29-Sep-1950, 69 y.o.   MRN: [id number]   CC: Increased swelling in abdomen/feet/ankles  HPI:  [name] is a very pleasant 69 year old male that presents today to discuss new onset swelling in his abdomen/feet/ankles.    ROS: pertinent noted in the HPI   Pertinent PMH, PSH, FH, SoHx: Current history of metastatic prostate cancer  Smoking status - ***  Objective:  There were no vitals taken for this visit.  Vitals and nursing note reviewed  General: NAD, pleasant, able to participate in exam Cardiac: RRR, S1 S2 present. normal heart sounds, no murmurs. Respiratory: CTAB, normal effort, No wheezes, rales or rhonchi Extremities: no edema or cyanosis. Skin: warm and dry, no rashes noted Neuro: alert, no obvious focal deficits Psych: Normal affect and mood   Assessment & Plan:    No problem-specific Assessment & Plan notes found for this encounter.    [name], [facility] PGY-1

## 2019-07-13 NOTE — Telephone Encounter (Signed)
-----   Message from [name], MD sent at 07/13/2019 10:17 AM EDT ----- This is unrelated to his cancer or cancer treatment.  I prefer that he sees his primary care physician regarding this issue.  Thanks ----- Message ----- From: [name], RN Sent: 07/13/2019   9:45 AM EDT To: [name], MD  Patient called to report new swelling from his abdomen to his feet. He states the swelling started yesterday. Denies any pain but states he has soreness around his kidneys. He states his abdomen his swollen to the point if looks like a pregnant woman's stomach and the swelling in bilateral ankles and feet is noticeable. He said is appetite is good but he is concerned about the onset of new swelling.  Lanelle Bal

## 2019-07-14 DIAGNOSIS — C61 Malignant neoplasm of prostate: Secondary | ICD-10-CM | POA: Diagnosis not present

## 2019-07-15 DIAGNOSIS — C61 Malignant neoplasm of prostate: Secondary | ICD-10-CM | POA: Diagnosis not present

## 2019-07-18 ENCOUNTER — Ambulatory Visit (INDEPENDENT_AMBULATORY_CARE_PROVIDER_SITE_OTHER): Payer: Medicaid Other | Admitting: Family Medicine

## 2019-07-18 ENCOUNTER — Other Ambulatory Visit: Payer: Self-pay

## 2019-07-18 ENCOUNTER — Encounter: Payer: Self-pay | Admitting: Family Medicine

## 2019-07-18 DIAGNOSIS — K5900 Constipation, unspecified: Secondary | ICD-10-CM | POA: Insufficient documentation

## 2019-07-18 DIAGNOSIS — K5903 Drug induced constipation: Secondary | ICD-10-CM | POA: Insufficient documentation

## 2019-07-18 DIAGNOSIS — C61 Malignant neoplasm of prostate: Secondary | ICD-10-CM | POA: Diagnosis not present

## 2019-07-18 MED ORDER — POLYETHYLENE GLYCOL 3350 17 GM/SCOOP PO POWD: 34.0000 g | Freq: Every day | ORAL | 0 refills | Status: DC

## 2019-07-18 NOTE — Patient Instructions (Signed)
There are plenty of medications that we can use to help with your constipation.  I have spoken with my attendings were not familiar with Trulance.  It would be my preference to avoid this medication right now because I am not familiar with it and I think we have a variety of other medications that we can use.  I recommend using MiraLAX daily.  For now, increase your MiraLAX to at least 2 capfuls or scoops daily.  You can increase your daily use of MiraLAX until you have normal, comfortable bowel movements.  It is nearly impossible to overdose on this medication.  Lower extremity swelling: If this becomes an issue in the future, I think would be reasonable to buy compression socks or compression hose.  You can easily bodies online or at a medical supply store.  If you do need these, I recommend looking for graded compression socks with at least 15 mmHg of pressure.

## 2019-07-18 NOTE — Assessment & Plan Note (Signed)
He was informed that none of the providers in the office today are familiar with Trulance.  Additionally, it appears to be prohibitively expensive, as much as $500 per month.  He was encouraged to continue with the less expensive cathartic medication for now. -Continue senna, 2 capsules daily -Continue docusate -Increase MiraLAX to at least 2 or 3 capfuls a day until bowel movements improved.  He was told that he can increase this to 5 or 6 capfuls a day if he finds that helpful. -Return to clinic or call back if this is not helpful the next 2-3 weeks -His senna can easily be increased as well to help with this opioid-induced constipation.

## 2019-07-18 NOTE — Progress Notes (Signed)
    SUBJECTIVE:   CHIEF COMPLAINT / HPI:   Andre Glass initially came to clinic today due to concern about swelling in his lower extremities.  This lower extremity swelling has entirely resolved but he would like to talk about constipation.  Constipation Andre Glass has had a history of constipation due to taking Dilaudid for the pain related to bone metastases.  His current constipation medication includes senna, 2 pills daily, docusate, MiraLAX 1 capful daily.  He reports that this is not enough and he still goes for 5 days between bowel movements.  His last bowel movement was last night and was moderately difficult.  He has previously been given free samples of a medication called Trulance which she found very helpful and wanted to know if he could have a refill of that.  PERTINENT  PMH / PSH: History of prostate cancer with metastases to bone and significant lymphadenopathy  OBJECTIVE:   BP (!) 145/65   Pulse (!) 55   Temp 97.8 F (36.6 C) (Oral)   Ht 5\' 8"  (1.727 m)   Wt 199 lb (90.3 kg)   SpO2 98%   BMI 30.26 kg/m    General: Alert and cooperative and appears to be in no acute distress Cardio: Normal S1 and S2, no S3 or S4. Rhythm is regular. No murmurs or rubs.   Pulm: Clear to auscultation bilaterally, no crackles, wheezing, or diminished breath sounds. Normal respiratory effort Abdomen: Abdomen soft and non-tender.    ASSESSMENT/PLAN:   Drug induced constipation He was informed that none of the providers in the office today are familiar with Trulance.  Additionally, it appears to be prohibitively expensive, as much as $500 per month.  He was encouraged to continue with the less expensive cathartic medication for now. -Continue senna, 2 capsules daily -Continue docusate -Increase MiraLAX to at least 2 or 3 capfuls a day until bowel movements improved.  He was told that he can increase this to 5 or 6 capfuls a day if he finds that helpful. -Return to clinic or call back if  this is not helpful the next 2-3 weeks -His senna can easily be increased as well to help with this opioid-induced constipation.     Matilde Haymaker, MD [place]

## 2019-07-19 ENCOUNTER — Telehealth: Payer: Self-pay

## 2019-07-19 DIAGNOSIS — R338 Other retention of urine: Secondary | ICD-10-CM | POA: Diagnosis not present

## 2019-07-19 DIAGNOSIS — C61 Malignant neoplasm of prostate: Secondary | ICD-10-CM | POA: Diagnosis not present

## 2019-07-19 DIAGNOSIS — C775 Secondary and unspecified malignant neoplasm of intrapelvic lymph nodes: Secondary | ICD-10-CM | POA: Diagnosis not present

## 2019-07-19 NOTE — Telephone Encounter (Signed)
Andre Glass, patients POA, calls nurse line requesting to speak with Dr. Valentina Lucks regarding new medication for constipation. [name] reports that they were seen in office yesterday and asked about Trulance but provider was unfamiliar. [name] requesting phone call from pharmacist  To Dr. Pervis Hocking, RN

## 2019-07-20 ENCOUNTER — Other Ambulatory Visit: Payer: Self-pay | Admitting: Family Medicine

## 2019-07-20 DIAGNOSIS — C61 Malignant neoplasm of prostate: Secondary | ICD-10-CM | POA: Diagnosis not present

## 2019-07-20 NOTE — Telephone Encounter (Signed)
Contacted patient RE his chronic constipation.  He reported that he had a bowel movement yesterday AND was in no distress currently.  He requests the TRULANCE medication refilled - new Rx sent from [place].   I believe this medication will be much more expensive but this patient prefers this medication (received and used before).    I informed him that one of the providers, Dr. Pilar Plate or Dr. Vanessa Chalco will hopefully be available to review and prescribe within the next 24 hours.    I asked him to contact us again - mid-day Friday (tomorrow) and I would facilitate another way if necessary.  He was understanding of this plan.

## 2019-07-20 NOTE — Telephone Encounter (Signed)
[name] calls nurse line on behalf of patient. The patient is very adamant on having Trulance called in for constipation. The patient is using the miralax as prescribed, however "works better with Trulance." Per Cecille Rubin note, looks like patient was given a sample of this is the past. [name] wants this medication called into his pharmacy. They will let us know if too expensive. Per [name], they spoke to the pharmacist about it and was advised of coupon they could use. Please advise.

## 2019-07-21 ENCOUNTER — Telehealth: Payer: Self-pay

## 2019-07-21 ENCOUNTER — Other Ambulatory Visit: Payer: Self-pay | Admitting: Family Medicine

## 2019-07-21 DIAGNOSIS — C61 Malignant neoplasm of prostate: Secondary | ICD-10-CM | POA: Diagnosis not present

## 2019-07-21 MED ORDER — TRULANCE 3 MG PO TABS: 3.0000 mg | ORAL_TABLET | Freq: Every day | ORAL | 2 refills | Status: DC

## 2019-07-21 NOTE — Telephone Encounter (Signed)
Patient's friend, Vanetta Shawl (who is on patient's release of information form) dropped off a page of a form that had previously been filled out by this office on 06/07/19 for patient's insurance to cover home care services.  Called and spoke to patient's friend to inquire about form. Friend states patient is currently receiving home care services but the patient would like to "have them come more often". Informed patient's friend that there needs to be a medical need for home care to come to home and for insurance to cover it. Since patient is currently being seen by a home care service, patient should inquire with them about needing more time.  If the insurance company needs [place] to fill out the form again, the patient needs to bring in a new, blank form.  Patient's friend verbalized understanding and stated the patient would like to discuss this with Dr. [name] at his next appointment on 07/28/19. Patient's friend stated she will bring in a new, blank form at that time.

## 2019-07-21 NOTE — Progress Notes (Signed)
Called patient and discussed his request to initiate a trial of Trulance (Plecanatide) for his constipation after having prior success with samples of this medication. Patient understands that this medication is not covered by his insurance and may be expensive but wishes to have it prescribed anyway. Patient has no contraindications to this medication. Will prescribe for patient.

## 2019-07-21 NOTE — Telephone Encounter (Signed)
FYI: Called patient and discussed Trulance with him. Patient understands it will likely be expensive but requests prescription anyway after using it before. Patient has no contraindications to this medication per it's documentation so I have sent it to his pharmacy.

## 2019-07-22 DIAGNOSIS — C61 Malignant neoplasm of prostate: Secondary | ICD-10-CM | POA: Diagnosis not present

## 2019-07-23 DIAGNOSIS — C61 Malignant neoplasm of prostate: Secondary | ICD-10-CM | POA: Diagnosis not present

## 2019-07-24 DIAGNOSIS — C61 Malignant neoplasm of prostate: Secondary | ICD-10-CM | POA: Diagnosis not present

## 2019-07-25 DIAGNOSIS — C61 Malignant neoplasm of prostate: Secondary | ICD-10-CM | POA: Diagnosis not present

## 2019-07-26 DIAGNOSIS — C61 Malignant neoplasm of prostate: Secondary | ICD-10-CM | POA: Diagnosis not present

## 2019-07-27 DIAGNOSIS — C61 Malignant neoplasm of prostate: Secondary | ICD-10-CM | POA: Diagnosis not present

## 2019-07-28 ENCOUNTER — Inpatient Hospital Stay: Payer: Medicaid Other

## 2019-07-28 ENCOUNTER — Other Ambulatory Visit: Payer: Self-pay

## 2019-07-28 ENCOUNTER — Telehealth: Payer: Self-pay

## 2019-07-28 ENCOUNTER — Inpatient Hospital Stay (HOSPITAL_BASED_OUTPATIENT_CLINIC_OR_DEPARTMENT_OTHER): Payer: Medicaid Other | Admitting: Oncology

## 2019-07-28 ENCOUNTER — Telehealth: Payer: Self-pay | Admitting: Pharmacist

## 2019-07-28 ENCOUNTER — Inpatient Hospital Stay: Payer: Medicaid Other | Attending: Oncology

## 2019-07-28 VITALS — BP 163/63 | HR 63 | Temp 98.7°F | Resp 18 | Wt 206.0 lb

## 2019-07-28 DIAGNOSIS — M25551 Pain in right hip: Secondary | ICD-10-CM | POA: Diagnosis not present

## 2019-07-28 DIAGNOSIS — C61 Malignant neoplasm of prostate: Secondary | ICD-10-CM

## 2019-07-28 DIAGNOSIS — M25552 Pain in left hip: Secondary | ICD-10-CM | POA: Diagnosis not present

## 2019-07-28 DIAGNOSIS — Z79818 Long term (current) use of other agents affecting estrogen receptors and estrogen levels: Secondary | ICD-10-CM | POA: Diagnosis not present

## 2019-07-28 DIAGNOSIS — Z79891 Long term (current) use of opiate analgesic: Secondary | ICD-10-CM | POA: Diagnosis not present

## 2019-07-28 DIAGNOSIS — C7951 Secondary malignant neoplasm of bone: Secondary | ICD-10-CM | POA: Insufficient documentation

## 2019-07-28 DIAGNOSIS — M545 Low back pain: Secondary | ICD-10-CM | POA: Insufficient documentation

## 2019-07-28 DIAGNOSIS — Z79899 Other long term (current) drug therapy: Secondary | ICD-10-CM | POA: Insufficient documentation

## 2019-07-28 DIAGNOSIS — K5900 Constipation, unspecified: Secondary | ICD-10-CM | POA: Diagnosis not present

## 2019-07-28 DIAGNOSIS — Z923 Personal history of irradiation: Secondary | ICD-10-CM | POA: Insufficient documentation

## 2019-07-28 LAB — CBC WITH DIFFERENTIAL (CANCER CENTER ONLY)
Abs Immature Granulocytes: 0.02 10*3/uL (ref 0.00–0.07)
Basophils Absolute: 0 10*3/uL (ref 0.0–0.1)
Basophils Relative: 0 %
Eosinophils Absolute: 0.3 10*3/uL (ref 0.0–0.5)
Eosinophils Relative: 7 %
HCT: 34.1 % — ABNORMAL LOW (ref 39.0–52.0)
Hemoglobin: 10.8 g/dL — ABNORMAL LOW (ref 13.0–17.0)
Immature Granulocytes: 0 %
Lymphocytes Relative: 32 %
Lymphs Abs: 1.4 10*3/uL (ref 0.7–4.0)
MCH: 29.3 pg (ref 26.0–34.0)
MCHC: 31.7 g/dL (ref 30.0–36.0)
MCV: 92.4 fL (ref 80.0–100.0)
Monocytes Absolute: 0.4 10*3/uL (ref 0.1–1.0)
Monocytes Relative: 9 %
Neutro Abs: 2.3 10*3/uL (ref 1.7–7.7)
Neutrophils Relative %: 52 %
Platelet Count: 243 10*3/uL (ref 150–400)
RBC: 3.69 MIL/uL — ABNORMAL LOW (ref 4.22–5.81)
RDW: 15.4 % (ref 11.5–15.5)
WBC Count: 4.5 10*3/uL (ref 4.0–10.5)
nRBC: 0 % (ref 0.0–0.2)

## 2019-07-28 LAB — CMP (CANCER CENTER ONLY)
ALT: 9 U/L (ref 0–44)
AST: 14 U/L — ABNORMAL LOW (ref 15–41)
Albumin: 3.2 g/dL — ABNORMAL LOW (ref 3.5–5.0)
Alkaline Phosphatase: 80 U/L (ref 38–126)
Anion gap: 7 (ref 5–15)
BUN: 15 mg/dL (ref 8–23)
CO2: 27 mmol/L (ref 22–32)
Calcium: 9.1 mg/dL (ref 8.9–10.3)
Chloride: 106 mmol/L (ref 98–111)
Creatinine: 0.9 mg/dL (ref 0.61–1.24)
GFR, Est AFR Am: >60 mL/min (ref >60)
GFR, Estimated: >60 mL/min (ref >60)
Glucose, Bld: 91 mg/dL (ref 70–99)
Potassium: 3.8 mmol/L (ref 3.5–5.1)
Sodium: 140 mmol/L (ref 135–145)
Total Bilirubin: 0.3 mg/dL (ref 0.3–1.2)
Total Protein: 7.7 g/dL (ref 6.5–8.1)

## 2019-07-28 MED ORDER — ABIRATERONE ACETATE 250 MG PO TABS: 1000.0000 mg | ORAL_TABLET | Freq: Every day | ORAL | 0 refills | Status: DC

## 2019-07-28 MED ORDER — PREDNISONE 5 MG PO TABS: 5.0000 mg | ORAL_TABLET | Freq: Every day | ORAL | 3 refills | Status: DC

## 2019-07-28 MED ORDER — LEUPROLIDE ACETATE (4 MONTH) 30 MG ~~LOC~~ KIT
30.0000 mg | PACK | Freq: Once | SUBCUTANEOUS | Status: AC
  Administered 2019-07-28: 30 mg via SUBCUTANEOUS
  Filled 2019-07-28: qty 30

## 2019-07-28 NOTE — Telephone Encounter (Signed)
Oral Oncology Patient Advocate Encounter  After completing a benefits investigation, prior authorization for Zytiga is not required at this time through [place].  Patient's copay is $1.30.     [facility] [place] Phone [phone] Fax [phone] 07/28/2019 2:34 PM

## 2019-07-28 NOTE — Patient Instructions (Signed)

## 2019-07-28 NOTE — Progress Notes (Signed)
Hematology and Oncology Follow Up Visit  Andre Glass [id number] 1950-05-20 69 y.o. 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  He was found to have Gleason score 4+5 = 9 and a PSA of 366 at the time of presentation.   Prior Therapy: He is status post prostate biopsy completed on December 3 of 2020.  Pathology showed a Gleason score 4+5 = 9 with 12 out of 12 cores.  He is status post both of radiation therapy to the spine between T9 and L1.  He received 30 Gray in 10 fractions between January 15 and 28, 2021.   Firmagon 240 mg started on May 31, 2019.   Current therapy:   He will receive Eligard every 4 months starting in April 2021. Under evaluation for therapy escalation.   Interim History: Andre Glass is here for repeat follow-up.  Since the last visit, he reports improvement in his overall pain but still significant enough that requires morphine twice a day.  He has cut down on his Dilaudid use at this time to once a day.  His pain is predominantly in the lower back hips which has interfered with his ability to ambulate regularly.  He does use a walker and a cane at home without any falls.  His appetite has improved and has gained more weight.  Denies any shortness of breath or difficulty breathing.  Denies any worsening neuropathy.     Medications: Unchanged on review. Current Outpatient Medications  Medication Sig Dispense Refill  . acetaminophen (TYLENOL) 325 MG tablet Take 650 mg by mouth every 6 (six) hours as needed for mild pain.    . Artificial Saliva (BIOTENE ORALBALANCE DRY MOUTH) GEL Use as directed 1 Dose in the mouth or throat 4 (four) times daily -  before meals and at bedtime. 42 g 5  . bisacodyl (DULCOLAX) 10 MG suppository Place 1 suppository (10 mg total) rectally as needed for moderate constipation. (Patient not taking:  Reported on 06/30/2019) 12 suppository 0  . docusate sodium (COLACE) 100 MG capsule Take 100 mg by mouth daily as needed for mild constipation or moderate constipation.    Marland Kitchen HYDROmorphone (DILAUDID) 4 MG tablet Take 1 tablet (4 mg total) by mouth every 4 (four) hours as needed for severe pain. 60 tablet 0  . lactulose (CHRONULAC) 10 GM/15ML solution Take 15 mLs (10 g total) by mouth 2 (two) times daily as needed for moderate constipation. 236 mL 0  . mirabegron ER (MYRBETRIQ) 25 MG TB24 tablet Take 1 tablet (25 mg total) by mouth daily. 30 tablet 0  . morphine (MS CONTIN) 30 MG 12 hr tablet Take 1 tablet (30 mg total) by mouth every 12 (twelve) hours. 60 tablet 0  . ondansetron (ZOFRAN) 4 MG tablet Take 4 mg by mouth 3 (three) times daily.    Marland Kitchen oxybutynin (DITROPAN) 5 MG tablet Take 5 mg by mouth 3 (three) times daily.    Marland Kitchen Plecanatide (TRULANCE) 3 MG TABS Take 3 mg by mouth daily. 30 tablet 2  . polyethylene glycol (MIRALAX) 17 g packet Take 17 g by mouth daily. 14 each 0  . polyethylene glycol powder (GLYCOLAX/MIRALAX) 17 GM/SCOOP powder Take 34 g by mouth daily. 850 g 0  . Pseudoephedrine HCl (SUDAFED) 30 MG/5ML LIQD Take 30 mg by mouth 4 (four) times daily as needed for congestion.    . senna (SENOKOT) 8.6  MG TABS tablet Take 1 tablet by mouth daily as needed for mild constipation.    . tamsulosin (FLOMAX) 0.4 MG CAPS capsule Take 1 capsule (0.4 mg total) by mouth daily after supper. 30 capsule 3   No current facility-administered medications for this visit.     Allergies:  Allergies  Allergen Reactions  . Ibuprofen Anaphylaxis  . Shrimp [Shellfish Allergy] Anaphylaxis       Physical Exam:   Blood pressure (!) 163/63, pulse 63, temperature 98.7 F (37.1 C), temperature source Temporal, resp. rate 18, weight 206 lb (93.4 kg), SpO2 100 %.     ECOG: 1   General appearance: Alert, awake without any distress. Head: Atraumatic without abnormalities Oropharynx: Without any  thrush or ulcers. Eyes: No scleral icterus. Lymph nodes: No lymphadenopathy noted in the cervical, supraclavicular, or axillary nodes Heart:regular rate and rhythm, without any murmurs or gallops.   Lung: Clear to auscultation without any rhonchi, wheezes or dullness to percussion. Abdomin: Soft, nontender without any shifting dullness or ascites. Musculoskeletal: No clubbing or cyanosis. Neurological: No motor or sensory deficits. Skin: No rashes or lesions.          Lab Results: Lab Results  Component Value Date   WBC 5.1 06/30/2019   HGB 10.1 (L) 06/30/2019   HCT 32.2 (L) 06/30/2019   MCV 89.7 06/30/2019   PLT 212 06/30/2019     Chemistry      Component Value Date/Time   NA 139 06/30/2019 1127   K 4.1 06/30/2019 1127   CL 106 06/30/2019 1127   CO2 28 06/30/2019 1127   BUN 15 06/30/2019 1127   CREATININE 0.93 06/30/2019 1127      Component Value Date/Time   CALCIUM 9.1 06/30/2019 1127   ALKPHOS 103 06/30/2019 1127   AST 13 (L) 06/30/2019 1127   ALT 8 06/30/2019 1127   BILITOT 0.3 06/30/2019 1127     Results for Andre Glass, Andre Glass (MRN [id number]) as of 07/28/2019 13:03  Ref. Range 05/24/2019 14:45 06/30/2019 11:27  Prostate Specific Ag, Serum Latest Ref Range: 0.0 - 4.0 ng/mL 1,118.0 (H) 121.0 (H)    Impression and Plan:   69 year old man with:  1.    Castration-sensitive advanced prostate cancer with disease to the bone and lymphadenopathy diagnosed in December 2020.  He is experiencing a clinical response and a PSA decline that is currently at 121.  The role for therapy escalation utilizing systemic chemotherapy versus oral targeted therapy such as Zytiga or Xtandi were reviewed today.  Complication associated with these treatments were also reiterated.  We have discussed specifically Zytiga today and complications that include nausea, fatigue, hypertension and fluid retention were reviewed.  The benefit would be better control of his PSA and improved overall  survival.  He is agreeable to proceed with 1000 mg daily with prednisone at 5 mg as well.   2.  Androgen deprivation therapy: Long-term complications associated with this therapy was reviewed.  These include weight gain, hot flashes and osteoporosis.  He will receive Eligard today and repeated in 4 months.  3.  Bone directed therapy: I recommended calcium and vitamin D supplements and obtain dental clearance for evaluation for possible Xgeva in the future.  4.  Bone pain: Manageable at this time with morphine and breakthrough Dilaudid.  His breakthrough has decreased since the last visit.    5.    Functional mobility: Still limited at this time and requires more assistance related to his prostate cancer to the bone.  6.  Constipation: I continue to recommend aggressive bowel regimen including MiraLAX and Senokot-S daily given his chronic opioid use.  Bowel habits are improved at this time.  7.  Follow-up: In 2 months for repeat evaluation.    30  minutes were dedicated to this visit.  Time was spent on updating status, reviewing laboratory data, addressing complication related to his cancer and cancer therapy.   Zola Button, MD 4/23/20211:00 PM

## 2019-07-28 NOTE — Telephone Encounter (Cosign Needed Addendum)
Oral Oncology Pharmacy Student Encounter  Received new prescription for Zytiga (abiraterone) for the treatment of castration sensitive metastatic prostate cancer in conjunction with prednisone and ADT therapy (Eligard) planned duration disease progression or uneccessary toxicity.  Labs from 07/28/19 assessed, all normal. PSA 121 from 06/30/19. BP elevated at 163/63 on 07/28/19. Continue to monitor BP. Prescription dose and frequency assessed.   Current medication list in Epic reviewed. Prescribed 4 tablets of Zytiga 250 mg once daily for total of 1000 mg daily. No DDIs identified.  Prescription has been e-scribed to the [facility] for benefits analysis and approval.  Oral Oncology Clinic will continue to follow for insurance authorization, copayment issues, initial counseling and start date.  [place] PharmD Candidate [facility] [phone]  07/28/2019 2:02 PM

## 2019-07-29 DIAGNOSIS — C61 Malignant neoplasm of prostate: Secondary | ICD-10-CM | POA: Diagnosis not present

## 2019-07-29 LAB — PROSTATE-SPECIFIC AG, SERUM (LABCORP): Prostate Specific Ag, Serum: 49.6 ng/mL — ABNORMAL HIGH (ref 0.0–4.0)

## 2019-07-30 DIAGNOSIS — C61 Malignant neoplasm of prostate: Secondary | ICD-10-CM | POA: Diagnosis not present

## 2019-07-31 ENCOUNTER — Telehealth: Payer: Self-pay | Admitting: Oncology

## 2019-07-31 DIAGNOSIS — C61 Malignant neoplasm of prostate: Secondary | ICD-10-CM | POA: Diagnosis not present

## 2019-07-31 MED FILL — ABIRATERONE ACETATE 250 MG: 250 | 30 days supply | Qty: 120 | Fill #0

## 2019-07-31 NOTE — Telephone Encounter (Signed)
Scheduled appt per 4/23 los.  Left a vm of the appt date and time.

## 2019-08-01 ENCOUNTER — Telehealth: Payer: Self-pay

## 2019-08-01 DIAGNOSIS — C61 Malignant neoplasm of prostate: Secondary | ICD-10-CM | POA: Diagnosis not present

## 2019-08-01 NOTE — Telephone Encounter (Signed)
Oral Chemotherapy Pharmacist Encounter   Reviewed and agree with the [facility] assessment by pharmacy student [place].  Darl Pikes, PharmD, BCPS, BCOP, CPP Hematology/Oncology Clinical Pharmacist [facility] [phone]  08/01/2019 12:25 PM

## 2019-08-01 NOTE — Telephone Encounter (Signed)
-----   Message from [name], MD sent at 08/01/2019  8:39 AM EDT ----- Please let him know his PSA is down

## 2019-08-01 NOTE — Telephone Encounter (Signed)
Called and left patient a voicemail and with PSA result and instructed him to call the office with any questions or concerns.

## 2019-08-01 NOTE — Telephone Encounter (Signed)
Oral Chemotherapy Pharmacist Encounter  [place] will delivery the [place] today 08/01/19. Mr. Hirschman knows to get started when he receives the [place]. He reports picking up the prednisone from [facility] already.  Patient Education I spoke with patient for overview of new oral chemotherapy medication: Zytiga (abiraterone).   Counseled patient on administration, dosing, side effects, monitoring, drug-food interactions, safe handling, storage, and disposal. Patient will take: Zytiga: Take 4 tablets (1,000 mg total) by mouth daily. Take on an empty stomach 1 hour before or 2 hours after a meal Prednisone: Take 1 tablet (5 mg total) by mouth daily with breakfast.  Side effects include wbut not limited to: fatigue, decreased wbc, HTN.    Patient reports checking his blood pressure at home and that he usually runs in the "130s/140s". This is lower than the blood pressures he gets when in the office. I asked Mr. Epperly to keep a blood pressure log at home once the Fabio Asa is started.  Reviewed with patient importance of keeping a medication schedule and plan for any missed doses.  Mr. Bolger voiced understanding and appreciation. All questions answered. Medication handout placed in the mail.  Provided patient with Oral [facility] phone number. Patient knows to call the office with questions or concerns. Oral Chemotherapy [facility] will continue to follow.  Darl Pikes, PharmD, BCPS, BCOP, CPP Hematology/Oncology Clinical Pharmacist [facility] [phone]  08/01/2019 12:26 PM

## 2019-08-02 DIAGNOSIS — C61 Malignant neoplasm of prostate: Secondary | ICD-10-CM | POA: Diagnosis not present

## 2019-08-03 DIAGNOSIS — C61 Malignant neoplasm of prostate: Secondary | ICD-10-CM | POA: Diagnosis not present

## 2019-08-04 ENCOUNTER — Other Ambulatory Visit: Payer: Self-pay | Admitting: Oncology

## 2019-08-04 ENCOUNTER — Telehealth: Payer: Self-pay

## 2019-08-04 DIAGNOSIS — C61 Malignant neoplasm of prostate: Secondary | ICD-10-CM | POA: Diagnosis not present

## 2019-08-04 MED ORDER — MORPHINE SULFATE ER 30 MG PO TBCR: 30.0000 mg | EXTENDED_RELEASE_TABLET | Freq: Two times a day (BID) | ORAL | 0 refills | Status: DC

## 2019-08-04 NOTE — Telephone Encounter (Signed)
-----   Message from [name], MD sent at 08/04/2019 12:13 PM EDT ----- Regarding: RE: FYI Not at this time. Thanks ----- Message ----- From: [name], RN Sent: 08/04/2019  12:02 PM EDT To: [name], MD Subject: Juluis Rainier                                            Patient's friend [name] called and wanted to let you know that patient seems to experience increased back and abdominal pain about 15 minutes after he takes [place]. She stated patient takes medication at 10:45 am each day. I told her patient called for a pain med refill this morning but did not mention anything about this. She said she thinks dosage may need to be decreased. I informed her that patient reported 9/10 back pain on 3/29 and that's when you restarted him on Morphine. I also let her know that med can be taken one hour before or two hours after a meal. Patient has not been eating anything until 11:45 am. She will try having patient eat breakfast and then wait 2 hours and take med to see if if makes a difference or take med earlier, wait an hour then eat. Would you like any additional instructions provided? Lanelle Bal

## 2019-08-04 NOTE — Telephone Encounter (Signed)
Andre Glass was made aware to contact the office with any new questions or concerns and she verbalized understanding.

## 2019-08-05 DIAGNOSIS — C61 Malignant neoplasm of prostate: Secondary | ICD-10-CM | POA: Diagnosis not present

## 2019-08-06 DIAGNOSIS — C61 Malignant neoplasm of prostate: Secondary | ICD-10-CM | POA: Diagnosis not present

## 2019-08-07 DIAGNOSIS — C61 Malignant neoplasm of prostate: Secondary | ICD-10-CM | POA: Diagnosis not present

## 2019-08-08 DIAGNOSIS — C61 Malignant neoplasm of prostate: Secondary | ICD-10-CM | POA: Diagnosis not present

## 2019-08-09 DIAGNOSIS — C61 Malignant neoplasm of prostate: Secondary | ICD-10-CM | POA: Diagnosis not present

## 2019-08-10 DIAGNOSIS — C61 Malignant neoplasm of prostate: Secondary | ICD-10-CM | POA: Diagnosis not present

## 2019-08-11 DIAGNOSIS — C61 Malignant neoplasm of prostate: Secondary | ICD-10-CM | POA: Diagnosis not present

## 2019-08-12 DIAGNOSIS — C61 Malignant neoplasm of prostate: Secondary | ICD-10-CM | POA: Diagnosis not present

## 2019-08-13 DIAGNOSIS — C61 Malignant neoplasm of prostate: Secondary | ICD-10-CM | POA: Diagnosis not present

## 2019-08-14 DIAGNOSIS — C61 Malignant neoplasm of prostate: Secondary | ICD-10-CM | POA: Diagnosis not present

## 2019-08-15 DIAGNOSIS — C61 Malignant neoplasm of prostate: Secondary | ICD-10-CM | POA: Diagnosis not present

## 2019-08-16 DIAGNOSIS — C61 Malignant neoplasm of prostate: Secondary | ICD-10-CM | POA: Diagnosis not present

## 2019-08-17 DIAGNOSIS — C61 Malignant neoplasm of prostate: Secondary | ICD-10-CM | POA: Diagnosis not present

## 2019-08-18 DIAGNOSIS — C61 Malignant neoplasm of prostate: Secondary | ICD-10-CM | POA: Diagnosis not present

## 2019-08-19 DIAGNOSIS — C61 Malignant neoplasm of prostate: Secondary | ICD-10-CM | POA: Diagnosis not present

## 2019-08-21 ENCOUNTER — Telehealth: Payer: Self-pay | Admitting: *Deleted

## 2019-08-21 ENCOUNTER — Inpatient Hospital Stay: Payer: Medicaid Other | Attending: Oncology | Admitting: Medical

## 2019-08-21 ENCOUNTER — Inpatient Hospital Stay: Payer: Medicaid Other

## 2019-08-21 ENCOUNTER — Other Ambulatory Visit: Payer: Self-pay

## 2019-08-21 VITALS — BP 197/85 | HR 60 | Temp 98.0°F | Resp 18 | Ht 68.0 in | Wt 199.3 lb

## 2019-08-21 DIAGNOSIS — C61 Malignant neoplasm of prostate: Secondary | ICD-10-CM

## 2019-08-21 DIAGNOSIS — C7951 Secondary malignant neoplasm of bone: Secondary | ICD-10-CM

## 2019-08-21 DIAGNOSIS — Z79899 Other long term (current) drug therapy: Secondary | ICD-10-CM | POA: Diagnosis not present

## 2019-08-21 DIAGNOSIS — N3000 Acute cystitis without hematuria: Secondary | ICD-10-CM

## 2019-08-21 DIAGNOSIS — N390 Urinary tract infection, site not specified: Secondary | ICD-10-CM | POA: Diagnosis not present

## 2019-08-21 DIAGNOSIS — R300 Dysuria: Secondary | ICD-10-CM

## 2019-08-21 LAB — CBC WITH DIFFERENTIAL (CANCER CENTER ONLY)
Abs Immature Granulocytes: 0.01 10*3/uL (ref 0.00–0.07)
Basophils Absolute: 0 10*3/uL (ref 0.0–0.1)
Basophils Relative: 1 %
Eosinophils Absolute: 0.1 10*3/uL (ref 0.0–0.5)
Eosinophils Relative: 3 %
HCT: 37.5 % — ABNORMAL LOW (ref 39.0–52.0)
Hemoglobin: 12.3 g/dL — ABNORMAL LOW (ref 13.0–17.0)
Immature Granulocytes: 0 %
Lymphocytes Relative: 34 %
Lymphs Abs: 1.5 10*3/uL (ref 0.7–4.0)
MCH: 29.5 pg (ref 26.0–34.0)
MCHC: 32.8 g/dL (ref 30.0–36.0)
MCV: 89.9 fL (ref 80.0–100.0)
Monocytes Absolute: 0.4 10*3/uL (ref 0.1–1.0)
Monocytes Relative: 8 %
Neutro Abs: 2.4 10*3/uL (ref 1.7–7.7)
Neutrophils Relative %: 54 %
Platelet Count: 222 10*3/uL (ref 150–400)
RBC: 4.17 MIL/uL — ABNORMAL LOW (ref 4.22–5.81)
RDW: 14.6 % (ref 11.5–15.5)
WBC Count: 4.4 10*3/uL (ref 4.0–10.5)
nRBC: 0 % (ref 0.0–0.2)

## 2019-08-21 LAB — CMP (CANCER CENTER ONLY)
ALT: 11 U/L (ref 0–44)
AST: 13 U/L — ABNORMAL LOW (ref 15–41)
Albumin: 3.4 g/dL — ABNORMAL LOW (ref 3.5–5.0)
Alkaline Phosphatase: 73 U/L (ref 38–126)
Anion gap: 8 (ref 5–15)
BUN: 12 mg/dL (ref 8–23)
CO2: 26 mmol/L (ref 22–32)
Calcium: 9.1 mg/dL (ref 8.9–10.3)
Chloride: 104 mmol/L (ref 98–111)
Creatinine: 0.9 mg/dL (ref 0.61–1.24)
GFR, Est AFR Am: >60 mL/min (ref >60)
GFR, Estimated: >60 mL/min (ref >60)
Glucose, Bld: 146 mg/dL — ABNORMAL HIGH (ref 70–99)
Potassium: 3.6 mmol/L (ref 3.5–5.1)
Sodium: 138 mmol/L (ref 135–145)
Total Bilirubin: 0.4 mg/dL (ref 0.3–1.2)
Total Protein: 7.8 g/dL (ref 6.5–8.1)

## 2019-08-21 LAB — URINALYSIS, COMPLETE (UACMP) WITH MICROSCOPIC
Bilirubin Urine: NEGATIVE
Glucose, UA: NEGATIVE mg/dL
Hgb urine dipstick: NEGATIVE
Ketones, ur: NEGATIVE mg/dL
Nitrite: POSITIVE — AB
Protein, ur: 30 mg/dL — AB
Specific Gravity, Urine: 1.016 (ref 1.005–1.030)
WBC, UA: >50 WBC/hpf — ABNORMAL HIGH (ref 0–5)
pH: 7 (ref 5.0–8.0)

## 2019-08-21 MED ORDER — PHENAZOPYRIDINE HCL 100 MG PO TABS: 100.0000 mg | ORAL_TABLET | Freq: Three times a day (TID) | ORAL | 0 refills | Status: DC | PRN

## 2019-08-21 MED ORDER — SULFAMETHOXAZOLE-TRIMETHOPRIM 800-160 MG PO TABS: 1.0000 | ORAL_TABLET | Freq: Two times a day (BID) | ORAL | 0 refills | Status: DC

## 2019-08-21 NOTE — Telephone Encounter (Signed)
Pt will come today at 2:30 for lab and see Sandi Mealy, PA at 3:00.  Labs ordered, message to scheduler.

## 2019-08-21 NOTE — Patient Instructions (Signed)
Urinary Tract Infection, Adult A urinary tract infection (UTI) is an infection of any part of the urinary tract. The urinary tract includes:  The kidneys.  The ureters.  The bladder.  The urethra. These organs make, store, and get rid of pee (urine) in the body. What are the causes? This is caused by germs (bacteria) in your genital area. These germs grow and cause swelling (inflammation) of your urinary tract. What increases the risk? You are more likely to develop this condition if:  You have a small, thin tube (catheter) to drain pee.  You cannot control when you pee or poop (incontinence).  You are male, and: ? You use these methods to prevent pregnancy:  A medicine that kills sperm (spermicide).  A device that blocks sperm (diaphragm). ? You have low levels of a male hormone (estrogen). ? You are pregnant.  You have genes that add to your risk.  You are sexually active.  You take antibiotic medicines.  You have trouble peeing because of: ? A prostate that is bigger than normal, if you are male. ? A blockage in the part of your body that drains pee from the bladder (urethra). ? A kidney stone. ? A nerve condition that affects your bladder (neurogenic bladder). ? Not getting enough to drink. ? Not peeing often enough.  You have other conditions, such as: ? Diabetes. ? A weak disease-fighting system (immune system). ? Sickle cell disease. ? Gout. ? Injury of the spine. What are the signs or symptoms? Symptoms of this condition include:  Needing to pee right away (urgently).  Peeing often.  Peeing small amounts often.  Pain or burning when peeing.  Blood in the pee.  Pee that smells bad or not like normal.  Trouble peeing.  Pee that is cloudy.  Fluid coming from the vagina, if you are male.  Pain in the belly or lower back. Other symptoms include:  Throwing up (vomiting).  No urge to eat.  Feeling mixed up (confused).  Being tired  and grouchy (irritable).  A fever.  Watery poop (diarrhea). How is this treated? This condition may be treated with:  Antibiotic medicine.  Other medicines.  Drinking enough water. Follow these instructions at home:  Medicines  Take over-the-counter and prescription medicines only as told by your doctor.  If you were prescribed an antibiotic medicine, take it as told by your doctor. Do not stop taking it even if you start to feel better. General instructions  Make sure you: ? Pee until your bladder is empty. ? Do not hold pee for a long time. ? Empty your bladder after sex. ? Wipe from front to back after pooping if you are a male. Use each tissue one time when you wipe.  Drink enough fluid to keep your pee pale yellow.  Keep all follow-up visits as told by your doctor. This is important. Contact a doctor if:  You do not get better after 1-2 days.  Your symptoms go away and then come back. Get help right away if:  You have very bad back pain.  You have very bad pain in your lower belly.  You have a fever.  You are sick to your stomach (nauseous).  You are throwing up. Summary  A urinary tract infection (UTI) is an infection of any part of the urinary tract.  This condition is caused by germs in your genital area.  There are many risk factors for a UTI. These include having a small, thin   tube to drain pee and not being able to control when you pee or poop.  Treatment includes antibiotic medicines for germs.  Drink enough fluid to keep your pee pale yellow. This information is not intended to replace advice given to you by your health care provider. Make sure you discuss any questions you have with your health care provider. Document Revised: 03/10/2018 Document Reviewed: 09/30/2017 Elsevier Patient Education  2020 [employer]  

## 2019-08-21 NOTE — Telephone Encounter (Signed)
He can be seen at symptom management clinic today or tomorrow.

## 2019-08-21 NOTE — Telephone Encounter (Signed)
Mr Okelley left a message stating he has "been having pains in stomach, headache, low grade fever that broke last night and pungent urine"

## 2019-08-22 DIAGNOSIS — C61 Malignant neoplasm of prostate: Secondary | ICD-10-CM | POA: Diagnosis not present

## 2019-08-22 LAB — URINE CULTURE

## 2019-08-22 LAB — PROSTATE-SPECIFIC AG, SERUM (LABCORP): Prostate Specific Ag, Serum: 13.3 ng/mL — ABNORMAL HIGH (ref 0.0–4.0)

## 2019-08-22 NOTE — Progress Notes (Signed)
[facility] Progress Note   Andre Glass [id number] [birth year] 69 y.o.  Andre Glass is managed by Dr. Zola Button  Actively treated with chemotherapy/immunotherapy/hormonal therapy: yes  Current therapy: Zytiga and Eligard  Last treated: Zytiga (daily) and Eligard (#3 on 07/28/2019)  Next scheduled appointment with provider: 09/19/2019  Assessment: Plan:    Acute cystitis without hematuria - Plan: sulfamethoxazole-trimethoprim (BACTRIM DS) 800-160 MG tablet, phenazopyridine (PYRIDIUM) 100 MG tablet  Spine metastasis ([place])  Malignant neoplasm of prostate ([place])   UTI: Urinalysis returned with findings suspicious for a UTI.  The urine culture was submitted.  The patient was placed on Bactrim DS p.o. twice daily x7 days and Pyridium 100 mg p.o. 3 times daily as needed for dysuria.  Metastatic prostate cancer with spinal metastasis: Andre Glass continues on [place] and Eligard under the direction of Dr. [name].  He is scheduled to be seen in follow-up on 09/19/2019.  Hypertension: [name] was told to monitor his blood pressure at home over the next 48 hours and to contact our office in 2 days with his blood pressure readings.  He was also told that he needs to follow-up with his primary care provider Dr. Lurline Glass should he continue to have poorly controlled hypertension.  Please see After Visit Summary for patient specific instructions.  Future Appointments  Date Time Provider [place]  09/19/2019 12:30 PM [facility] 6 [facility] None  09/19/2019  1:00 PM Shadad, Mathis Dad, MD [facility] None    No orders of the defined types were placed in this encounter.      Subjective:   Patient ID:  Andre Glass is a 69 y.o. (DOB 24-Jan-1951) male.  Chief Complaint:  Chief Complaint  Patient presents with  . Urinary Tract Infection    HPI Andre Glass  is a 69 y.o. male with a diagnosis of a metastatic prostate cancer with spinal metastasis: Andre Glass  continues on [place] and Eligard under the direction of Dr. [name].  He presents to clinic with generalized stomach pain over the past several days with pungent smelling urine and a subjective fever last evening.  He is also admitting his blood pressure has been elevated at home that he has had a headache recently.  He also reports bilateral flank and lower back pain.  He denies chills, chest pain, or shortness of breath.  He has a Foley catheter in place.  He reports his blood pressure at home generally is around 140/75.  His blood pressure tonight was 197/85 and repeat was 204/80.   Medications: I have reviewed the patient's current medications.  Allergies:  Allergies  Allergen Reactions  . Ibuprofen Anaphylaxis  . Shrimp [Shellfish Allergy] Anaphylaxis    Past Medical History:  Diagnosis Date  . Prostate cancer ([place])     No past surgical history on file.  Family History  Problem Relation Age of Onset  . Stomach cancer Mother   . Pancreatic cancer Sister   . Breast cancer Neg Hx   . Prostate cancer Neg Hx   . Colon cancer Neg Hx     Social History   Socioeconomic History  . Marital status: Single    Spouse name: Not on file  . Number of children: 0  . Years of education: Not on file  . Highest education level: Not on file  Occupational History  . Not on file  Tobacco Use  . Smoking status: Never Smoker  . Smokeless tobacco: Never Used  Substance and Sexual Activity  . Alcohol use: Never  . Drug use: Never  . Sexual activity: Not Currently  Other Topics Concern  . Not on file  Social History Narrative  . Not on file   Social Determinants of Health   Financial Resource Strain:   . Difficulty of Paying Living Expenses:   Food Insecurity:   . Worried About Charity fundraiser in the Last Year:   . Arboriculturist in the Last Year:   Transportation Needs:   . Film/video editor (Medical):   Marland Kitchen Lack of Transportation (Non-Medical):   Physical Activity:   .  Days of Exercise per Week:   . Minutes of Exercise per Session:   Stress:   . Feeling of Stress :   Social Connections:   . Frequency of Communication with Friends and Family:   . Frequency of Social Gatherings with Friends and Family:   . Attends Religious Services:   . Active Member of Clubs or Organizations:   . Attends Archivist Meetings:   Marland Kitchen Marital Status:   Intimate Partner Violence:   . Fear of Current or Ex-Partner:   . Emotionally Abused:   Marland Kitchen Physically Abused:   . Sexually Abused:     Past Medical History, Surgical history, Social history, and Family history were reviewed and updated as appropriate.   Please see review of systems for further details on the patient's review from today.   Review of Systems:  Review of Systems  Constitutional: Positive for diaphoresis and fever. Negative for chills.  HENT: Negative for trouble swallowing.   Respiratory: Negative for cough and shortness of breath.   Cardiovascular: Negative for chest pain, palpitations and leg swelling.  Gastrointestinal: Positive for abdominal pain. Negative for constipation, diarrhea, nausea and vomiting.  Genitourinary: Positive for dysuria, flank pain and frequency. Negative for difficulty urinating, hematuria and urgency.  Musculoskeletal: Positive for back pain.  Skin: Negative for rash.  Neurological: Negative for dizziness and headaches.    Objective:   Physical Exam:  BP (!) 197/85 (BP Location: Left Arm, Patient Position: Sitting)   Pulse 60   Temp 98 F (36.7 C) (Temporal)   Resp 18   Ht 5\' 8"  (1.727 m)   Wt 90.4 kg (199 lb 4.8 oz)   SpO2 100%   BMI 30.30 kg/m  ECOG: 0  Physical Exam Constitutional:      General: He is not in acute distress.    Appearance: He is diaphoretic.  HENT:     Head: Normocephalic and atraumatic.     Mouth/Throat:     Pharynx: No oropharyngeal exudate.  Eyes:     General: No scleral icterus.       Right eye: No discharge.        Left  eye: No discharge.  Cardiovascular:     Rate and Rhythm: Normal rate and regular rhythm.     Heart sounds: Normal heart sounds. No murmur. No friction rub. No gallop.   Pulmonary:     Effort: Pulmonary effort is normal. No respiratory distress.     Breath sounds: Normal breath sounds. No wheezing or rales.  Abdominal:     General: Bowel sounds are normal. There is no distension.     Palpations: Abdomen is soft. There is no mass.     Tenderness: There is abdominal tenderness (Suprapubic tenderness.). There is no guarding or rebound.  Musculoskeletal:        General: Tenderness (Bilateral flank  pain) present.     Cervical back: Normal range of motion and neck supple.  Lymphadenopathy:     Cervical: No cervical adenopathy.  Skin:    General: Skin is warm.     Findings: No erythema or rash.  Neurological:     Mental Status: He is alert.     Gait: Gait abnormal (The patient is ambulating with the use of a wheelchair.).  Psychiatric:        Mood and Affect: Mood normal.        Behavior: Behavior normal.        Thought Content: Thought content normal.        Judgment: Judgment normal.     Lab Review:     Component Value Date/Time   NA 138 08/21/2019 1442   K 3.6 08/21/2019 1442   CL 104 08/21/2019 1442   CO2 26 08/21/2019 1442   GLUCOSE 146 (H) 08/21/2019 1442   BUN 12 08/21/2019 1442   CREATININE 0.90 08/21/2019 1442   CALCIUM 9.1 08/21/2019 1442   PROT 7.8 08/21/2019 1442   ALBUMIN 3.4 (L) 08/21/2019 1442   AST 13 (L) 08/21/2019 1442   ALT 11 08/21/2019 1442   ALKPHOS 73 08/21/2019 1442   BILITOT 0.4 08/21/2019 1442   GFRNONAA >60 08/21/2019 1442   GFRAA >60 08/21/2019 1442       Component Value Date/Time   WBC 4.4 08/21/2019 1442   WBC 6.2 06/18/2019 1843   RBC 4.17 (L) 08/21/2019 1442   HGB 12.3 (L) 08/21/2019 1442   HCT 37.5 (L) 08/21/2019 1442   PLT 222 08/21/2019 1442   MCV 89.9 08/21/2019 1442   MCH 29.5 08/21/2019 1442   MCHC 32.8 08/21/2019 1442   RDW  14.6 08/21/2019 1442   LYMPHSABS 1.5 08/21/2019 1442   MONOABS 0.4 08/21/2019 1442   EOSABS 0.1 08/21/2019 1442   BASOSABS 0.0 08/21/2019 1442   -------------------------------  Imaging from last 24 hours (if applicable):  Radiology interpretation: No results found.

## 2019-08-23 ENCOUNTER — Other Ambulatory Visit: Payer: Self-pay | Admitting: Oncology

## 2019-08-23 DIAGNOSIS — C61 Malignant neoplasm of prostate: Secondary | ICD-10-CM | POA: Diagnosis not present

## 2019-08-24 DIAGNOSIS — C61 Malignant neoplasm of prostate: Secondary | ICD-10-CM | POA: Diagnosis not present

## 2019-08-24 MED FILL — ABIRATERONE ACETATE 250 MG: 250 | 30 days supply | Qty: 120 | Fill #0

## 2019-08-25 DIAGNOSIS — C61 Malignant neoplasm of prostate: Secondary | ICD-10-CM | POA: Diagnosis not present

## 2019-08-26 DIAGNOSIS — C61 Malignant neoplasm of prostate: Secondary | ICD-10-CM | POA: Diagnosis not present

## 2019-08-27 DIAGNOSIS — C61 Malignant neoplasm of prostate: Secondary | ICD-10-CM | POA: Diagnosis not present

## 2019-08-28 DIAGNOSIS — C61 Malignant neoplasm of prostate: Secondary | ICD-10-CM | POA: Diagnosis not present

## 2019-08-29 DIAGNOSIS — C61 Malignant neoplasm of prostate: Secondary | ICD-10-CM | POA: Diagnosis not present

## 2019-08-30 DIAGNOSIS — C61 Malignant neoplasm of prostate: Secondary | ICD-10-CM | POA: Diagnosis not present

## 2019-08-31 DIAGNOSIS — C61 Malignant neoplasm of prostate: Secondary | ICD-10-CM | POA: Diagnosis not present

## 2019-09-01 DIAGNOSIS — C61 Malignant neoplasm of prostate: Secondary | ICD-10-CM | POA: Diagnosis not present

## 2019-09-02 DIAGNOSIS — C61 Malignant neoplasm of prostate: Secondary | ICD-10-CM | POA: Diagnosis not present

## 2019-09-03 DIAGNOSIS — C61 Malignant neoplasm of prostate: Secondary | ICD-10-CM | POA: Diagnosis not present

## 2019-09-10 DIAGNOSIS — C61 Malignant neoplasm of prostate: Secondary | ICD-10-CM | POA: Diagnosis not present

## 2019-09-11 DIAGNOSIS — C61 Malignant neoplasm of prostate: Secondary | ICD-10-CM | POA: Diagnosis not present

## 2019-09-12 DIAGNOSIS — C61 Malignant neoplasm of prostate: Secondary | ICD-10-CM | POA: Diagnosis not present

## 2019-09-13 DIAGNOSIS — C61 Malignant neoplasm of prostate: Secondary | ICD-10-CM | POA: Diagnosis not present

## 2019-09-14 ENCOUNTER — Encounter: Payer: Self-pay | Admitting: Family Medicine

## 2019-09-14 ENCOUNTER — Ambulatory Visit (INDEPENDENT_AMBULATORY_CARE_PROVIDER_SITE_OTHER): Payer: Medicaid Other | Admitting: Family Medicine

## 2019-09-14 ENCOUNTER — Ambulatory Visit: Payer: Medicaid Other | Admitting: Family Medicine

## 2019-09-14 ENCOUNTER — Other Ambulatory Visit: Payer: Self-pay

## 2019-09-14 VITALS — BP 132/68 | HR 53 | Ht 68.0 in | Wt 207.8 lb

## 2019-09-14 DIAGNOSIS — T83511A Infection and inflammatory reaction due to indwelling urethral catheter, initial encounter: Secondary | ICD-10-CM

## 2019-09-14 DIAGNOSIS — N390 Urinary tract infection, site not specified: Secondary | ICD-10-CM | POA: Insufficient documentation

## 2019-09-14 DIAGNOSIS — C61 Malignant neoplasm of prostate: Secondary | ICD-10-CM | POA: Diagnosis not present

## 2019-09-14 LAB — POCT URINALYSIS DIP (CLINITEK)
Bilirubin, UA: NEGATIVE
Glucose, UA: NEGATIVE mg/dL
Ketones, POC UA: NEGATIVE mg/dL
Nitrite, UA: POSITIVE — AB
POC PROTEIN,UA: 30 — AB
Spec Grav, UA: 1.025 (ref 1.010–1.025)
Urobilinogen, UA: 0.2 E.U./dL
pH, UA: 6 (ref 5.0–8.0)

## 2019-09-14 LAB — POCT UA - MICROSCOPIC ONLY

## 2019-09-14 MED ORDER — AMOXICILLIN-POT CLAVULANATE 500-125 MG PO TABS: 1.0000 | ORAL_TABLET | Freq: Three times a day (TID) | ORAL | 0 refills | Status: AC

## 2019-09-14 MED ORDER — ONDANSETRON HCL 4 MG PO TABS: 4.0000 mg | ORAL_TABLET | Freq: Three times a day (TID) | ORAL | 0 refills | Status: DC

## 2019-09-14 NOTE — Progress Notes (Signed)
    SUBJECTIVE:   CHIEF COMPLAINT / HPI:   Urinary symptoms  He reports recurrent cloudy malodorous urine, flank pain and abdominal pain. The current episode started about a month ago and is had gotten better, but now returning. Patient states symptoms are mild and moderate in intensity, occurring constantly. He  has been recently treated for similar symptoms.  Patient seen last month by his oncologist.  Had very similar symptoms then.  UA was consistent with urinary tract infection.  Was treated with a course of Bactrim.  Symptoms did get better but then recurred past few days.  Patient has chronic indwelling catheter because he has history of prostate cancer. Last changed in April.    Associated symptoms: Yes abdominal pain No back pain  Yes chills Yes constipation  Yes cramping No diarrhea  Yes   discharge No fever  No hematuria Yes nausea  Yes vomiting    ---------------------------------------------------------------------------------------   OBJECTIVE:   BP 132/68   Pulse (!) 53   Ht 5\' 8"  (1.727 m)   Wt 207 lb 12.8 oz (94.3 kg)   SpO2 97%   BMI 31.60 kg/m   Gen: NAD, resting comfortably CV: RRR with no murmurs appreciated Pulm: NWOB, CTAB with no crackles, wheezes, or rhonchi GI: CVA tenderness, Soft, tender lower quadrants, Nondistended. MSK: no edema, cyanosis, or clubbing noted Skin: warm, dry Neuro: grossly normal, moves all extremities   ASSESSMENT/PLAN:   Catheter-associated urinary tract infection (HCC) Mild to moderate pyelonephritis. UA constant with infection. Urine culture pending.  - Augmentin for 14 days - Zofran for nausea - Return precautions discussed.  - Recommend catheter change     Bonnita Hollow, MD [place]

## 2019-09-14 NOTE — Assessment & Plan Note (Signed)
Mild to moderate pyelonephritis. UA constant with infection. Urine culture pending.  - Augmentin for 14 days - Zofran for nausea - Return precautions discussed.  - Recommend catheter change

## 2019-09-14 NOTE — Patient Instructions (Signed)
Pyelonephritis, Adult  Pyelonephritis is an infection that occurs in the kidney. The kidneys are organs that help clean the blood by moving waste out of the blood and into the pee (urine). This infection can happen quickly, or it can last for a long time. In most cases, it clears up with treatment and does not cause other problems. What are the causes? This condition may be caused by:  Germs (bacteria) going from the bladder up to the kidney. This may happen after having a bladder infection.  Germs going from the blood to the kidney. What increases the risk? This condition is more likely to develop in:  Pregnant women.  Older people.  People who have any of these conditions: ? Diabetes. ? Inflammation of the prostate gland (prostatitis), in males. ? Kidney stones or bladder stones. ? Other problems with the kidney or the parts of your body that carry pee from the kidneys to the bladder (ureters). ? Cancer.  People who have a small, thin tube (catheter) placed in the bladder.  People who are sexually active.  Women who use a medicine that kills sperm (spermicide) to prevent pregnancy.  People who have had a prior urinary tract infection (UTI). What are the signs or symptoms? Symptoms of this condition include:  Peeing often.  A strong urge to pee right away.  Burning or stinging when peeing.  Belly pain.  Back pain.  Pain in the side (flank area).  Fever or chills.  Blood in the pee, or dark pee.  Feeling sick to your stomach (nauseous) or throwing up (vomiting). How is this treated? This condition may be treated by:  Taking antibiotic medicines by mouth (orally).  Drinking enough fluids. If the infection is bad, you may need to stay in the hospital. You may be given antibiotics and fluids that are put directly into a vein through an IV tube. In some cases, other treatments may be needed. Follow these instructions at home: Medicines  Take your antibiotic  medicine as told by your doctor. Do not stop taking the antibiotic even if you start to feel better.  Take over-the-counter and prescription medicines only as told by your doctor. General instructions   Drink enough fluid to keep your pee pale yellow.  Avoid caffeine, tea, and carbonated drinks.  Pee (urinate) often. Avoid holding in pee for long periods of time.  Pee before and after sex.  After pooping (having a bowel movement), women should wipe from front to back. Use each tissue only once.  Keep all follow-up visits as told by your doctor. This is important. Contact a doctor if:  You do not feel better after 2 days.  Your symptoms get worse.  You have a fever. Get help right away if:  You cannot take your medicine or drink fluids as told.  You have chills and shaking.  You throw up.  You have very bad pain in your side or back.  You feel very weak or you pass out (faint). Summary  Pyelonephritis is an infection that occurs in the kidney.  In most cases, this infection clears up with treatment and does not cause other problems.  Take your antibiotic medicine as told by your doctor. Do not stop taking the antibiotic even if you start to feel better.  Drink enough fluid to keep your pee pale yellow. This information is not intended to replace advice given to you by your health care provider. Make sure you discuss any questions you have with  your health care provider. Document Revised: 01/25/2018 Document Reviewed: 01/25/2018 Elsevier Patient Education  2020 Elsevier Inc.  

## 2019-09-15 DIAGNOSIS — C61 Malignant neoplasm of prostate: Secondary | ICD-10-CM | POA: Diagnosis not present

## 2019-09-16 DIAGNOSIS — C61 Malignant neoplasm of prostate: Secondary | ICD-10-CM | POA: Diagnosis not present

## 2019-09-17 DIAGNOSIS — C61 Malignant neoplasm of prostate: Secondary | ICD-10-CM | POA: Diagnosis not present

## 2019-09-18 DIAGNOSIS — C61 Malignant neoplasm of prostate: Secondary | ICD-10-CM | POA: Diagnosis not present

## 2019-09-19 ENCOUNTER — Other Ambulatory Visit: Payer: Self-pay

## 2019-09-19 ENCOUNTER — Inpatient Hospital Stay: Payer: Medicaid Other

## 2019-09-19 ENCOUNTER — Inpatient Hospital Stay: Payer: Medicaid Other | Attending: Oncology | Admitting: Oncology

## 2019-09-19 VITALS — BP 165/85 | HR 70 | Temp 97.3°F | Resp 18 | Wt 211.2 lb

## 2019-09-19 DIAGNOSIS — Z7952 Long term (current) use of systemic steroids: Secondary | ICD-10-CM | POA: Diagnosis not present

## 2019-09-19 DIAGNOSIS — Z79899 Other long term (current) drug therapy: Secondary | ICD-10-CM | POA: Insufficient documentation

## 2019-09-19 DIAGNOSIS — Z79818 Long term (current) use of other agents affecting estrogen receptors and estrogen levels: Secondary | ICD-10-CM | POA: Diagnosis not present

## 2019-09-19 DIAGNOSIS — K5900 Constipation, unspecified: Secondary | ICD-10-CM | POA: Insufficient documentation

## 2019-09-19 DIAGNOSIS — Z923 Personal history of irradiation: Secondary | ICD-10-CM | POA: Diagnosis not present

## 2019-09-19 DIAGNOSIS — C61 Malignant neoplasm of prostate: Secondary | ICD-10-CM

## 2019-09-19 DIAGNOSIS — C7951 Secondary malignant neoplasm of bone: Secondary | ICD-10-CM | POA: Insufficient documentation

## 2019-09-19 DIAGNOSIS — R339 Retention of urine, unspecified: Secondary | ICD-10-CM | POA: Insufficient documentation

## 2019-09-19 DIAGNOSIS — Z960 Presence of urogenital implants: Secondary | ICD-10-CM | POA: Insufficient documentation

## 2019-09-19 LAB — CMP (CANCER CENTER ONLY)
ALT: 9 U/L (ref 0–44)
AST: 12 U/L — ABNORMAL LOW (ref 15–41)
Albumin: 3.3 g/dL — ABNORMAL LOW (ref 3.5–5.0)
Alkaline Phosphatase: 66 U/L (ref 38–126)
Anion gap: 8 (ref 5–15)
BUN: 17 mg/dL (ref 8–23)
CO2: 29 mmol/L (ref 22–32)
Calcium: 9.2 mg/dL (ref 8.9–10.3)
Chloride: 103 mmol/L (ref 98–111)
Creatinine: 1.05 mg/dL (ref 0.61–1.24)
GFR, Est AFR Am: >60 mL/min (ref >60)
GFR, Estimated: >60 mL/min (ref >60)
Glucose, Bld: 96 mg/dL (ref 70–99)
Potassium: 3.8 mmol/L (ref 3.5–5.1)
Sodium: 140 mmol/L (ref 135–145)
Total Bilirubin: 0.3 mg/dL (ref 0.3–1.2)
Total Protein: 7.1 g/dL (ref 6.5–8.1)

## 2019-09-19 LAB — CBC WITH DIFFERENTIAL (CANCER CENTER ONLY)
Abs Immature Granulocytes: 0.02 10*3/uL (ref 0.00–0.07)
Basophils Absolute: 0 10*3/uL (ref 0.0–0.1)
Basophils Relative: 1 %
Eosinophils Absolute: 0.1 10*3/uL (ref 0.0–0.5)
Eosinophils Relative: 3 %
HCT: 37.2 % — ABNORMAL LOW (ref 39.0–52.0)
Hemoglobin: 11.9 g/dL — ABNORMAL LOW (ref 13.0–17.0)
Immature Granulocytes: 1 %
Lymphocytes Relative: 28 %
Lymphs Abs: 1.2 10*3/uL (ref 0.7–4.0)
MCH: 30.4 pg (ref 26.0–34.0)
MCHC: 32 g/dL (ref 30.0–36.0)
MCV: 95.1 fL (ref 80.0–100.0)
Monocytes Absolute: 0.4 10*3/uL (ref 0.1–1.0)
Monocytes Relative: 9 %
Neutro Abs: 2.6 10*3/uL (ref 1.7–7.7)
Neutrophils Relative %: 58 %
Platelet Count: 221 10*3/uL (ref 150–400)
RBC: 3.91 MIL/uL — ABNORMAL LOW (ref 4.22–5.81)
RDW: 13.8 % (ref 11.5–15.5)
WBC Count: 4.4 10*3/uL (ref 4.0–10.5)
nRBC: 0 % (ref 0.0–0.2)

## 2019-09-19 MED ORDER — MORPHINE SULFATE ER 30 MG PO TBCR: 30.0000 mg | EXTENDED_RELEASE_TABLET | Freq: Two times a day (BID) | ORAL | 0 refills | Status: DC

## 2019-09-19 NOTE — Progress Notes (Signed)
Hematology and Oncology Follow Up Visit  Andre Glass [id number] 1950/09/15 69 y.o. 09/19/2019 12:39 PM Andre Glass, Andre Glass, [name], DO   Principle Diagnosis: 69 year old man with advanced prostate cancer disease to the bone presented with Gleason score 4+5 = 9 and a PSA of 366 in December 2020.   Prior Therapy: He is status post prostate biopsy completed on December 3 of 2020.  Pathology showed a Gleason score 4+5 = 9 with 12 out of 12 cores.  He is status post both of radiation therapy to the spine between T9 and L1.  He received 30 Gray in 10 fractions between January 15 and 28, 2021.   Firmagon 240 mg started on May 31, 2019.   Current therapy:   Eligard every 4 months starting in April 2021.  Next Eligard will be given in August 2021.  Zytiga 1000 mg daily with prednisone 5 mg daily started in May 2021.  Interim History: [name] returns today for a repeat evaluation.  Since her last visit, he reports no major changes in his health.  He has started Zytiga and prednisone without any major complications.  He denies any nausea vomiting or abdominal pain.  Denies any weight loss or appetite changes.  He is eating better and has gained more weight.  He did report recurrent urinary tract infection continues to have Foley catheter in place.  His mobility is improving using a cane without any falls or syncope.  His pain is under control currently on morphine without any need for breakthrough medication regularly.    Medications: Reviewed without changes. Current Outpatient Medications  Medication Sig Dispense Refill  . abiraterone acetate (ZYTIGA) 250 MG tablet TAKE 4 TABLETS (1,000 MG TOTAL) BY MOUTH DAILY. TAKE ON AN EMPTY STOMACH 1 HOUR BEFORE OR 2 HOURS AFTER A MEAL 120 tablet 0  . acetaminophen (TYLENOL) 325 MG tablet Take 650 mg by mouth every 6 (six) hours as needed for mild pain.    Marland Kitchen amoxicillin-clavulanate (AUGMENTIN) 500-125 MG tablet Take 1 tablet (500 mg total) by  mouth 3 (three) times daily for 14 days. 42 tablet 0  . Artificial Saliva (BIOTENE ORALBALANCE DRY MOUTH) GEL Use as directed 1 Dose in the mouth or throat 4 (four) times daily -  before meals and at bedtime. 42 g 5  . bisacodyl (DULCOLAX) 10 MG suppository Place 1 suppository (10 mg total) rectally as needed for moderate constipation. (Patient not taking: Reported on 06/30/2019) 12 suppository 0  . docusate sodium (COLACE) 100 MG capsule Take 100 mg by mouth daily as needed for mild constipation or moderate constipation.    Marland Kitchen HYDROmorphone (DILAUDID) 4 MG tablet Take 1 tablet (4 mg total) by mouth every 4 (four) hours as needed for severe pain. 60 tablet 0  . lactulose (CHRONULAC) 10 GM/15ML solution Take 15 mLs (10 g total) by mouth 2 (two) times daily as needed for moderate constipation. 236 mL 0  . mirabegron ER (MYRBETRIQ) 25 MG TB24 tablet Take 1 tablet (25 mg total) by mouth daily. 30 tablet 0  . morphine (MS CONTIN) 30 MG 12 hr tablet Take 1 tablet (30 mg total) by mouth every 12 (twelve) hours. 60 tablet 0  . ondansetron (ZOFRAN) 4 MG tablet Take 1 tablet (4 mg total) by mouth 3 (three) times daily. 20 tablet 0  . oxybutynin (DITROPAN) 5 MG tablet Take 5 mg by mouth 3 (three) times daily.    . phenazopyridine (PYRIDIUM) 100 MG tablet Take 1 tablet (100 mg  total) by mouth 3 (three) times daily as needed for pain. 21 tablet 0  . Plecanatide (TRULANCE) 3 MG TABS Take 3 mg by mouth daily. 30 tablet 2  . polyethylene glycol (MIRALAX) 17 g packet Take 17 g by mouth daily. 14 each 0  . polyethylene glycol powder (GLYCOLAX/MIRALAX) 17 GM/SCOOP powder Take 34 g by mouth daily. 850 g 0  . predniSONE (DELTASONE) 5 MG tablet Take 1 tablet (5 mg total) by mouth daily with breakfast. 90 tablet 3  . Pseudoephedrine HCl (SUDAFED) 30 MG/5ML LIQD Take 30 mg by mouth 4 (four) times daily as needed for congestion.    . senna (SENOKOT) 8.6 MG TABS tablet Take 1 tablet by mouth daily as needed for mild  constipation.    . sulfamethoxazole-trimethoprim (BACTRIM DS) 800-160 MG tablet Take 1 tablet by mouth 2 (two) times daily. 14 tablet 0  . tamsulosin (FLOMAX) 0.4 MG CAPS capsule Take 1 capsule (0.4 mg total) by mouth daily after supper. 30 capsule 3   No current facility-administered medications for this visit.     Allergies:  Allergies  Allergen Reactions  . Ibuprofen Anaphylaxis  . Shrimp [Shellfish Allergy] Anaphylaxis       Physical Exam:   Blood pressure (!) 165/85, pulse 70, temperature (!) 97.3 F (36.3 C), temperature source Temporal, resp. rate 18, weight 211 lb 3.2 oz (95.8 kg), SpO2 100 %.      ECOG: 1    General appearance: Comfortable appearing without any discomfort Head: Normocephalic without any trauma Oropharynx: Mucous membranes are moist and pink without any thrush or ulcers. Eyes: Pupils are equal and round reactive to light. Lymph nodes: No cervical, supraclavicular, inguinal or axillary lymphadenopathy.   Heart:regular rate and rhythm.  S1 and S2 without leg edema. Lung: Clear without any rhonchi or wheezes.  No dullness to percussion. Abdomin: Soft, nontender, nondistended with good bowel sounds.  No hepatosplenomegaly. Musculoskeletal: No joint deformity or effusion.  Full range of motion noted. Neurological: No deficits noted on motor, sensory and deep tendon reflex exam. Skin: No petechial rash or dryness.  Appeared moist.           Lab Results: Lab Results  Component Value Date   WBC 4.4 08/21/2019   HGB 12.3 (L) 08/21/2019   HCT 37.5 (L) 08/21/2019   MCV 89.9 08/21/2019   PLT 222 08/21/2019     Chemistry      Component Value Date/Time   NA 138 08/21/2019 1442   K 3.6 08/21/2019 1442   CL 104 08/21/2019 1442   CO2 26 08/21/2019 1442   BUN 12 08/21/2019 1442   CREATININE 0.90 08/21/2019 1442      Component Value Date/Time   CALCIUM 9.1 08/21/2019 1442   ALKPHOS 73 08/21/2019 1442   AST 13 (L) 08/21/2019 1442   ALT 11  08/21/2019 1442   BILITOT 0.4 08/21/2019 1442      Results for Berka, Blanca R (MRN [id number]) as of 09/19/2019 12:32  Ref. Range 06/30/2019 11:27 07/28/2019 12:51 08/21/2019 14:42  Prostate Specific Ag, Serum Latest Ref Range: 0.0 - 4.0 ng/mL 121.0 (H) 49.6 (H) 13.3 (H)    Impression and Plan:   69 year old man with:  1.    Advanced prostate cancer with disease to the bone and lymphadenopathy diagnosed in December 2020.  He has castration-sensitive disease.  He continues to have excellent PSA response and Zytiga was added to his therapy.  Risks and benefits of continuing this therapy long-term were reviewed potential  complications that include nausea, vomiting, edema as well as hypertension were reiterated.  Alternative treatment options including Xtandi and systemic chemotherapy were reviewed.  He is agreeable to continue at this time.   2.  Androgen deprivation therapy: He is currently on Eligard which she will receive in August 2021.  Long-term complications including weight gain, hot flashes and sexual dysfunction were reviewed.  3.  Bone directed therapy: He would be a candidate for Xgeva after obtaining dental clearance.  In the meantime recommended calcium and vitamin D supplements.  4.  Bone pain: Related to prostate cancer.  He is currently on morphine has required less breakthrough pain medication.    5.    Functional mobility: Improving although still dependent on others for some activities of daily living.  He uses a cane for ambulation.  6.  Constipation: Manageable at this time with the current regimen.  7.  Urinary retention: Foley catheter in place and has a follow-up with urology.  8.  Follow-up: In August 2021 for repeat evaluation and Eligard injection.    30  minutes were spent on this encounter.  The time was dedicated to reviewing his disease status, addressing complications related to his cancer and cancer therapy and future plan of care  discussion.   Zola Button, MD 6/15/202112:39 PM

## 2019-09-20 ENCOUNTER — Telehealth: Payer: Self-pay | Admitting: Oncology

## 2019-09-20 ENCOUNTER — Telehealth: Payer: Self-pay

## 2019-09-20 DIAGNOSIS — C61 Malignant neoplasm of prostate: Secondary | ICD-10-CM | POA: Diagnosis not present

## 2019-09-20 LAB — PROSTATE-SPECIFIC AG, SERUM (LABCORP): Prostate Specific Ag, Serum: 5.1 ng/mL — ABNORMAL HIGH (ref 0.0–4.0)

## 2019-09-20 NOTE — Telephone Encounter (Signed)
Scheduled appt per 6/15 los.  Left a vm of the appt date and time.

## 2019-09-20 NOTE — Telephone Encounter (Signed)
TC to Pt per Dr. [name],  informed him that his PSA result is down (5.1) Pt. Happy with result. No further problems or concerns noted.

## 2019-09-20 NOTE — Telephone Encounter (Signed)
-----   Message from [name], MD sent at 09/20/2019  8:57 AM EDT ----- Please let him know his PSA is down.

## 2019-09-21 ENCOUNTER — Other Ambulatory Visit: Payer: Self-pay | Admitting: Urology

## 2019-09-21 DIAGNOSIS — C61 Malignant neoplasm of prostate: Secondary | ICD-10-CM | POA: Diagnosis not present

## 2019-09-22 DIAGNOSIS — R3914 Feeling of incomplete bladder emptying: Secondary | ICD-10-CM | POA: Diagnosis not present

## 2019-09-22 DIAGNOSIS — C61 Malignant neoplasm of prostate: Secondary | ICD-10-CM | POA: Diagnosis not present

## 2019-09-23 DIAGNOSIS — C61 Malignant neoplasm of prostate: Secondary | ICD-10-CM | POA: Diagnosis not present

## 2019-09-24 ENCOUNTER — Ambulatory Visit
Admission: EM | Admit: 2019-09-24 | Discharge: 2019-09-24 | Disposition: A | Discharge status: Home / Self Care | Payer: Medicaid Other

## 2019-09-24 ENCOUNTER — Encounter: Payer: Self-pay | Admitting: Emergency Medicine

## 2019-09-24 ENCOUNTER — Other Ambulatory Visit: Payer: Self-pay

## 2019-09-24 ENCOUNTER — Emergency Department (HOSPITAL_COMMUNITY)
Admission: EM | Admit: 2019-09-24 | Discharge: 2019-09-25 | Disposition: A | Discharge status: Home / Self Care | Payer: Medicaid Other | Attending: Emergency Medicine | Admitting: Emergency Medicine

## 2019-09-24 ENCOUNTER — Encounter (HOSPITAL_COMMUNITY): Payer: Self-pay | Admitting: Emergency Medicine

## 2019-09-24 DIAGNOSIS — R140 Abdominal distension (gaseous): Secondary | ICD-10-CM | POA: Diagnosis not present

## 2019-09-24 DIAGNOSIS — Z886 Allergy status to analgesic agent status: Secondary | ICD-10-CM | POA: Diagnosis not present

## 2019-09-24 DIAGNOSIS — R531 Weakness: Secondary | ICD-10-CM | POA: Insufficient documentation

## 2019-09-24 DIAGNOSIS — R001 Bradycardia, unspecified: Secondary | ICD-10-CM | POA: Diagnosis not present

## 2019-09-24 DIAGNOSIS — C61 Malignant neoplasm of prostate: Secondary | ICD-10-CM | POA: Diagnosis not present

## 2019-09-24 DIAGNOSIS — R030 Elevated blood-pressure reading, without diagnosis of hypertension: Secondary | ICD-10-CM

## 2019-09-24 DIAGNOSIS — Z8546 Personal history of malignant neoplasm of prostate: Secondary | ICD-10-CM | POA: Diagnosis not present

## 2019-09-24 DIAGNOSIS — K5900 Constipation, unspecified: Secondary | ICD-10-CM | POA: Diagnosis not present

## 2019-09-24 DIAGNOSIS — R6881 Early satiety: Secondary | ICD-10-CM

## 2019-09-24 DIAGNOSIS — Z85830 Personal history of malignant neoplasm of bone: Secondary | ICD-10-CM | POA: Diagnosis not present

## 2019-09-24 DIAGNOSIS — Z79899 Other long term (current) drug therapy: Secondary | ICD-10-CM | POA: Diagnosis not present

## 2019-09-24 DIAGNOSIS — I10 Essential (primary) hypertension: Secondary | ICD-10-CM | POA: Diagnosis not present

## 2019-09-24 DIAGNOSIS — Z8589 Personal history of malignant neoplasm of other organs and systems: Secondary | ICD-10-CM

## 2019-09-24 DIAGNOSIS — Z792 Long term (current) use of antibiotics: Secondary | ICD-10-CM | POA: Diagnosis not present

## 2019-09-24 HISTORY — DX: Urinary tract infection, site not specified: N39.0

## 2019-09-24 LAB — COMPREHENSIVE METABOLIC PANEL
ALT: 13 U/L (ref 0–44)
AST: 16 U/L (ref 15–41)
Albumin: 3.9 g/dL (ref 3.5–5.0)
Alkaline Phosphatase: 64 U/L (ref 38–126)
Anion gap: 9 (ref 5–15)
BUN: 15 mg/dL (ref 8–23)
CO2: 28 mmol/L (ref 22–32)
Calcium: 9.1 mg/dL (ref 8.9–10.3)
Chloride: 105 mmol/L (ref 98–111)
Creatinine, Ser: 0.78 mg/dL (ref 0.61–1.24)
GFR calc Af Amer: >60 mL/min (ref >60)
GFR calc non Af Amer: >60 mL/min (ref >60)
Glucose, Bld: 97 mg/dL (ref 70–99)
Potassium: 3.7 mmol/L (ref 3.5–5.1)
Sodium: 142 mmol/L (ref 135–145)
Total Bilirubin: 0.6 mg/dL (ref 0.3–1.2)
Total Protein: 7.9 g/dL (ref 6.5–8.1)

## 2019-09-24 LAB — URINALYSIS, ROUTINE W REFLEX MICROSCOPIC
Bilirubin Urine: NEGATIVE
Glucose, UA: NEGATIVE mg/dL
Hgb urine dipstick: NEGATIVE
Ketones, ur: NEGATIVE mg/dL
Nitrite: NEGATIVE
Protein, ur: NEGATIVE mg/dL
Specific Gravity, Urine: 1.008 (ref 1.005–1.030)
pH: 8 (ref 5.0–8.0)

## 2019-09-24 LAB — CBC
HCT: 39.4 % (ref 39.0–52.0)
Hemoglobin: 12.7 g/dL — ABNORMAL LOW (ref 13.0–17.0)
MCH: 30.6 pg (ref 26.0–34.0)
MCHC: 32.2 g/dL (ref 30.0–36.0)
MCV: 94.9 fL (ref 80.0–100.0)
Platelets: 205 10*3/uL (ref 150–400)
RBC: 4.15 MIL/uL — ABNORMAL LOW (ref 4.22–5.81)
RDW: 13.7 % (ref 11.5–15.5)
WBC: 5.2 10*3/uL (ref 4.0–10.5)
nRBC: 0 % (ref 0.0–0.2)

## 2019-09-24 LAB — LIPASE, BLOOD: Lipase: 17 U/L (ref 11–51)

## 2019-09-24 LAB — TROPONIN I (HIGH SENSITIVITY)
Troponin I (High Sensitivity): 15 ng/L (ref <18)
Troponin I (High Sensitivity): 14 ng/L (ref <18)

## 2019-09-24 MED ORDER — AMLODIPINE BESYLATE 5 MG PO TABS
5.0000 mg | ORAL_TABLET | Freq: Once | ORAL | Status: AC
  Administered 2019-09-24: 5 mg via ORAL
  Filled 2019-09-24: qty 1

## 2019-09-24 NOTE — ED Provider Notes (Signed)
[facility]    CSN: [id number] Arrival date & time: 09/24/19  1505      History   Chief Complaint Chief Complaint  Patient presents with  . Constipation    HPI Andre Glass is a 69 y.o. male with history of prostate cancer, spine metastasis presenting for constipation.  States this has been since earlier last week.  Last normal BM was Monday/Tuesday.  Had a small, hard stool without blood or melena yesterday.  Denies thin stool, encopresis.  Does endorse generalized abdominal discomfort with distention and decreased flatus.  No nausea or vomiting, though does have decreased appetite.  No fever.   Past Medical History:  Diagnosis Date  . Prostate cancer [facility])     Patient Active Problem List   Diagnosis Date Noted  . Catheter-associated urinary tract infection ([place]) 09/14/2019  . Drug induced constipation 07/18/2019  . Establishing care with new doctor, encounter for 06/26/2019  . 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 Malignant neoplasm of prostate ([place]) 04/19/2019    History reviewed. No pertinent surgical history.     Home Medications    Prior to Admission medications   Medication Sig Start Date End Date Taking? Authorizing Provider  abiraterone acetate (ZYTIGA) 250 MG tablet TAKE 4 TABLETS (1,000 MG TOTAL) BY MOUTH DAILY. TAKE ON AN EMPTY STOMACH 1 HOUR BEFORE OR 2 HOURS AFTER A MEAL 08/23/19   [name], MD  acetaminophen (TYLENOL) 325 MG tablet Take 650 mg by mouth every 6 (six) hours as needed for mild pain.    [provider]  amoxicillin-clavulanate (AUGMENTIN) 500-125 MG tablet Take 1 tablet (500 mg total) by mouth 3 (three) times daily for 14 days. 09/14/19 09/28/19  Bonnita Hollow, MD  Artificial Saliva (BIOTENE ORALBALANCE DRY MOUTH) GEL Use as directed 1 Dose in the mouth or throat 4 (four) times daily -  before meals and at bedtime. 04/28/19   Tyler Pita, MD  bisacodyl (DULCOLAX) 10 MG suppository Place 1 suppository (10 mg  total) rectally as needed for moderate constipation. Patient not taking: Reported on 06/30/2019 05/29/19   Dalia Heading, PA-C  docusate sodium (COLACE) 100 MG capsule Take 100 mg by mouth daily as needed for mild constipation or moderate constipation.    [provider]  HYDROmorphone (DILAUDID) 4 MG tablet Take 1 tablet (4 mg total) by mouth every 4 (four) hours as needed for severe pain. 07/03/19   [name], MD  lactulose (CHRONULAC) 10 GM/15ML solution Take 15 mLs (10 g total) by mouth 2 (two) times daily as needed for moderate constipation. 07/06/19   [name], DO  mirabegron ER (MYRBETRIQ) 25 MG TB24 tablet Take 1 tablet (25 mg total) by mouth daily. 07/06/19   [name], [name], DO  morphine (MS CONTIN) 30 MG 12 hr tablet Take 1 tablet (30 mg total) by mouth every 12 (twelve) hours. 09/19/19   [name], MD  ondansetron (ZOFRAN) 4 MG tablet Take 1 tablet (4 mg total) by mouth 3 (three) times daily. 09/14/19   Bonnita Hollow, MD  oxybutynin (DITROPAN) 5 MG tablet Take 5 mg by mouth 3 (three) times daily.    [provider]  phenazopyridine (PYRIDIUM) 100 MG tablet Take 1 tablet (100 mg total) by mouth 3 (three) times daily as needed for pain. 08/21/19   Tanner, Lyndon Code., PA-C  Plecanatide (TRULANCE) 3 MG TABS Take 3 mg by mouth daily. 07/21/19   [name], [name], DO  polyethylene glycol (MIRALAX) 17  g packet Take 17 g by mouth daily. 06/18/19   Dorie Rank, MD  polyethylene glycol powder (GLYCOLAX/MIRALAX) 17 GM/SCOOP powder Take 34 g by mouth daily. 07/18/19   Matilde Haymaker, MD  predniSONE (DELTASONE) 5 MG tablet Take 1 tablet (5 mg total) by mouth daily with breakfast. 07/28/19   [name], MD  Pseudoephedrine HCl (SUDAFED) 30 MG/5ML LIQD Take 30 mg by mouth 4 (four) times daily as needed for congestion.    [provider]  senna (SENOKOT) 8.6 MG TABS tablet Take 1 tablet by mouth daily as needed for mild constipation.    [provider]    sulfamethoxazole-trimethoprim (BACTRIM DS) 800-160 MG tablet Take 1 tablet by mouth 2 (two) times daily. Patient not taking: Reported on 09/24/2019 08/21/19   Harle Stanford., PA-C  tamsulosin (FLOMAX) 0.4 MG CAPS capsule Take 1 capsule (0.4 mg total) by mouth daily after supper. 06/07/19   Bruning, Ashlyn, PA-C    Family History Family History  Problem Relation Age of Onset  . Stomach cancer Mother   . Pancreatic cancer Sister   . Breast cancer Neg Hx   . Prostate cancer Neg Hx   . Colon cancer Neg Hx     Social History Social History   Tobacco Use  . Smoking status: Never Smoker  . Smokeless tobacco: Never Used  Vaping Use  . Vaping Use: Never used  Substance Use Topics  . Alcohol use: Never  . Drug use: Never     Allergies   Ibuprofen and Shrimp [shellfish allergy]   Review of Systems As per HPI   Physical Exam Triage Vital Signs ED Triage Vitals  Enc Vitals Group     BP      Pulse      Resp      Temp      Temp src      SpO2      Weight      Height      Head Circumference      Peak Flow      Pain Score      Pain Loc      Pain Edu?      Excl. in [place]?    No data found.  Updated Vital Signs BP (!) 195/49 (BP Location: Left Arm)   Pulse (!) 55   Temp 97.7 F (36.5 C) (Oral)   Resp 16   SpO2 98%   Visual Acuity Right Eye Distance:   Left Eye Distance:   Bilateral Distance:    Right Eye Near:   Left Eye Near:    Bilateral Near:     Physical Exam Constitutional:      General: He is not in acute distress.    Appearance: He is obese. He is ill-appearing.     Comments: Patient appears uncomfortable  HENT:     Head: Normocephalic and atraumatic.  Eyes:     General: No scleral icterus.    Pupils: Pupils are equal, round, and reactive to light.  Cardiovascular:     Rate and Rhythm: Normal rate.  Pulmonary:     Effort: Pulmonary effort is normal. No respiratory distress.     Breath sounds: No wheezing.  Abdominal:     General: There is  distension.     Tenderness: There is abdominal tenderness.     Comments: Diffuse  Skin:    Coloration: Skin is not jaundiced or pale.  Neurological:     Mental Status: He is  alert and oriented to person, place, and time.      UC Treatments / Results  Labs (all labs ordered are listed, but only abnormal results are displayed) Labs Reviewed - No data to display  EKG   Radiology No results found.  Procedures Procedures (including critical care time)  Medications Ordered in UC Medications - No data to display  Initial Impression / Assessment and Plan / UC Course  I have reviewed the triage vital signs and the nursing notes.  Pertinent labs & imaging results that were available during my care of the patient were reviewed by me and considered in my medical decision making (see chart for details).     Patient febrile, nontoxic in office today.  Patient does have significant abdominal distention and comfort.  Given history of malignancy with metastasis to bone, discussed constipation could be due to possible GI innervation malfunction second to new metastasis vs obstruction.  Patient electing to present to ER and personal vehicle: Done so in stable condition.  Return precautions discussed, patient verbalized understanding and is agreeable to plan. Final Clinical Impressions(s) / UC Diagnoses   Final diagnoses:  Constipation, unspecified constipation type  Abdominal distension  History of prostate cancer  History of cancer metastatic to bone  Early satiety     Discharge Instructions     Recommend you go to ER for further evaluation of your symptoms.    ED Prescriptions    None     PDMP not reviewed this encounter.   Hall-Potvin, [place], [place] 09/24/19 1542

## 2019-09-24 NOTE — ED Provider Notes (Signed)
[facility] DEPT Provider Note   CSN: [id number] Arrival date & time: 09/24/19  1557     History Chief Complaint  Patient presents with  . Constipation    Andre Glass is a 69 y.o. male.  The history is provided by the patient and medical records. No language interpreter was used.  Constipation  Andre Glass is a 69 y.o. male who presents to the Emergency Department complaining of constipation. He has a history of metastatic prostate cancer with Foley catheter in place presents the emergency department for progressive constipation over the last several weeks. He is on daily morphine for pain control. He states that he is having decreased bowel movements despite taking multiple medications at home for his bowels. He was able to have two bowel movements over the last week he describes it as very small, hard and partial. He has associated generalized abdominal discomfort. They reports of fever, nausea, vomiting. During his ED stay he did develop some associated sharp, central chest pain that is waxing and waning, none currently. He has chronic pain secondary to his cancer and states that this is at his baseline. He has difficulty ambulating at baseline due to weakness and pain in his legs.    Past Medical History:  Diagnosis Date  . Prostate cancer ([place])   . UTI (urinary tract infection)     Patient Active Problem List   Diagnosis Date Noted  . Catheter-associated urinary tract infection ([place]) 09/14/2019  . Drug induced constipation 07/18/2019  . Establishing care with new doctor, encounter for 06/26/2019  . 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 Malignant neoplasm of prostate ([place]) 04/19/2019    History reviewed. No pertinent surgical history.     Family History  Problem Relation Age of Onset  . Stomach cancer Mother   . Pancreatic cancer Sister   . Breast cancer Neg Hx   . Prostate cancer Neg Hx   . Colon cancer Neg Hx     Social History    Tobacco Use  . Smoking status: Never Smoker  . Smokeless tobacco: Never Used  Vaping Use  . Vaping Use: Never used  Substance Use Topics  . Alcohol use: Never  . Drug use: Never    Home Medications Prior to Admission medications   Medication Sig Start Date End Date Taking? Authorizing Provider  abiraterone acetate (ZYTIGA) 250 MG tablet TAKE 4 TABLETS (1,000 MG TOTAL) BY MOUTH DAILY. TAKE ON AN EMPTY STOMACH 1 HOUR BEFORE OR 2 HOURS AFTER A MEAL 08/23/19  Yes [name], MD  acetaminophen (TYLENOL) 325 MG tablet Take 650 mg by mouth every 6 (six) hours as needed for mild pain.   Yes [provider]  amoxicillin-clavulanate (AUGMENTIN) 500-125 MG tablet Take 1 tablet (500 mg total) by mouth 3 (three) times daily for 14 days. 09/14/19 09/28/19 Yes Bonnita Hollow, MD  Artificial Saliva (BIOTENE ORALBALANCE DRY MOUTH) GEL Use as directed 1 Dose in the mouth or throat 4 (four) times daily -  before meals and at bedtime. 04/28/19  Yes Tyler Pita, MD  bisacodyl (DULCOLAX) 10 MG suppository Place 1 suppository (10 mg total) rectally as needed for moderate constipation. 05/29/19  Yes Lawyer, Harrell Gave, PA-C  docusate sodium (COLACE) 100 MG capsule Take 100 mg by mouth daily as needed for mild constipation or moderate constipation.   Yes [provider]  HYDROmorphone (DILAUDID) 4 MG tablet Take 1 tablet (4 mg total) by mouth every 4 (four) hours as  needed for severe pain. 07/03/19  Yes [name], MD  lactulose (CHRONULAC) 10 GM/15ML solution Take 15 mLs (10 g total) by mouth 2 (two) times daily as needed for moderate constipation. 07/06/19  Yes [name], [name], DO  mirabegron ER (MYRBETRIQ) 25 MG TB24 tablet Take 1 tablet (25 mg total) by mouth daily. 07/06/19  Yes [name], [name], DO  morphine (MS CONTIN) 30 MG 12 hr tablet Take 1 tablet (30 mg total) by mouth every 12 (twelve) hours. 09/19/19  Yes [name], MD  ondansetron (ZOFRAN) 4 MG tablet Take 1 tablet (4 mg  total) by mouth 3 (three) times daily. Patient taking differently: Take 4 mg by mouth every 8 (eight) hours as needed for nausea or vomiting.  09/14/19  Yes Bonnita Hollow, MD  oxybutynin (DITROPAN) 5 MG tablet Take 5 mg by mouth 3 (three) times daily.   Yes [provider]  phenazopyridine (PYRIDIUM) 100 MG tablet Take 1 tablet (100 mg total) by mouth 3 (three) times daily as needed for pain. 08/21/19  Yes Tanner, Lyndon Code., PA-C  Plecanatide (TRULANCE) 3 MG TABS Take 3 mg by mouth daily. 07/21/19  Yes [name], [name], DO  polyethylene glycol (MIRALAX) 17 g packet Take 17 g by mouth daily. Patient taking differently: Take 17 g by mouth daily as needed for mild constipation.  06/18/19  Yes Dorie Rank, MD  predniSONE (DELTASONE) 5 MG tablet Take 1 tablet (5 mg total) by mouth daily with breakfast. 07/28/19  Yes Shadad, Mathis Dad, MD  Pseudoephedrine HCl (SUDAFED) 30 MG/5ML LIQD Take 30 mg by mouth 4 (four) times daily as needed for congestion.   Yes [provider]  senna (SENOKOT) 8.6 MG TABS tablet Take 1 tablet by mouth daily as needed for mild constipation.   Yes [provider]  tamsulosin (FLOMAX) 0.4 MG CAPS capsule Take 1 capsule (0.4 mg total) by mouth daily after supper. 06/07/19  Yes Bruning, Ashlyn, PA-C  polyethylene glycol powder (GLYCOLAX/MIRALAX) 17 GM/SCOOP powder Take 34 g by mouth daily. Patient not taking: Reported on 09/24/2019 07/18/19   Matilde Haymaker, MD  sulfamethoxazole-trimethoprim (BACTRIM DS) 800-160 MG tablet Take 1 tablet by mouth 2 (two) times daily. Patient not taking: Reported on 09/24/2019 08/21/19   Harle Stanford., PA-C    Allergies    Ibuprofen and Shrimp [shellfish allergy]  Review of Systems   Review of Systems  Gastrointestinal: Positive for constipation.  All other systems reviewed and are negative.   Physical Exam Updated Vital Signs BP (!) 198/91 (BP Location: Right Arm)   Pulse (!) 48   Temp 97.9 F (36.6 C)   Resp 16   Ht 5\' 8"   (1.727 m)   Wt 95.7 kg   SpO2 100%   BMI 32.08 kg/m   Physical Exam Vitals and nursing note reviewed.  Constitutional:      Appearance: He is well-developed.  HENT:     Head: Normocephalic and atraumatic.  Cardiovascular:     Rate and Rhythm: Regular rhythm. Bradycardia present.     Heart sounds: No murmur heard.   Pulmonary:     Effort: Pulmonary effort is normal. No respiratory distress.     Breath sounds: Normal breath sounds.  Abdominal:     Palpations: Abdomen is soft.     Tenderness: There is no guarding or rebound.     Comments: Mild generalized abdominal tenderness  Genitourinary:    Comments: Foley catheter in place Musculoskeletal:  General: No swelling or tenderness.  Skin:    General: Skin is warm and dry.  Neurological:     Mental Status: He is alert and oriented to person, place, and time.     Comments: Five out of five strength and bilateral upper extremities. 4 to 5 strength and bilateral lower extremities (patient states this is his baseline).  Psychiatric:        Behavior: Behavior normal.     ED Results / Procedures / Treatments   Labs (all labs ordered are listed, but only abnormal results are displayed) Labs Reviewed  CBC - Abnormal; Notable for the following components:      Result Value   RBC 4.15 (*)    Hemoglobin 12.7 (*)    All other components within normal limits  URINALYSIS, ROUTINE W REFLEX MICROSCOPIC - Abnormal; Notable for the following components:   Color, Urine STRAW (*)    Leukocytes,Ua TRACE (*)    Bacteria, UA RARE (*)    All other components within normal limits  LIPASE, BLOOD  COMPREHENSIVE METABOLIC PANEL  TROPONIN I (HIGH SENSITIVITY)  TROPONIN I (HIGH SENSITIVITY)    EKG EKG Interpretation  Date/Time:  Sunday September 24 2019 18:27:22 EDT Ventricular Rate:  49 PR Interval:    QRS Duration: 78 QT Interval:  426 QTC Calculation: 385 R Axis:   -21 Text Interpretation: Sinus bradycardia Low voltage, precordial  leads Abnormal R-wave progression, early transition LVH by voltage Nonspecific T abnormalities, inferior leads Baseline wander in lead(s) I III aVL no prior available for comparison Confirmed by Quintella Reichert [phone]) on 09/24/2019 6:57:09 PM   Radiology No results found.  Procedures Procedures (including critical care time)  Medications Ordered in ED Medications  amLODipine (NORVASC) tablet 5 mg (5 mg Oral Given 09/24/19 2247)    ED Course  I have reviewed the triage vital signs and the nursing notes.  Pertinent labs & imaging results that were available during my care of the patient were reviewed by me and considered in my medical decision making (see chart for details).    MDM Rules/Calculators/A&P                         Patient with history of metastatic prostate cancer here for evaluation of acute on chronic constipation. He had an enema the emergency department with resolution in his symptoms. He does have some lower extremity weakness that patient states is at his baseline and he is ambulatory at his baseline. He states that these have been present since his MRI of his spine in February of this year. Discussed with patient risks and benefits of additional imaging today and he declines at this time. While patient was in the lobby he did have transient chest pain. EKG is without acute ischemic changes. He is noted to be hypertensive but has no history of hypertension. Discussed with patient findings of hypertension and recommend initiating blood pressure medications. Patient declines starting prescriptions at this time for his blood pressure and prefers to follow-up with his PCP. Troponin's are negative times two in presentation is not consistent with ACS. Patient has no respiratory symptoms and feel that PE is unlikely. Please note that there is a documented vital signs of a heart rate of 118 and this is an accurate. Patient without any tachycardia during his ED stay.  Final Clinical  Impression(s) / ED Diagnoses Final diagnoses:  Constipation, unspecified constipation type  Elevated blood pressure reading    Rx /  DC Orders ED Discharge Orders    None       Quintella Reichert, MD 09/24/19 2333

## 2019-09-24 NOTE — Discharge Instructions (Addendum)
Recommend you go to ER for further evaluation of your symptoms.

## 2019-09-24 NOTE — ED Triage Notes (Signed)
Pt here for constipation with small BM yesterday; pt sts had enema on Thursday with results; pt has abd fullness at present

## 2019-09-24 NOTE — ED Notes (Signed)
Patient now complaining of chest pain. Will obtain EKG.

## 2019-09-24 NOTE — ED Triage Notes (Signed)
Patient c/o constipation and abdominal pain. States last BM yesterday. Denies vomiting.

## 2019-09-24 NOTE — ED Notes (Signed)
ED Provider at bedside. 

## 2019-09-24 NOTE — Discharge Instructions (Addendum)
Your blood pressure was elevated during your Emergency Department stay.  Please follow up with your family doctor for recheck.  Get rechecked immediately if you new chest pain, difficulty breathing, fevers, numbness, weakness or new concerning symptoms.

## 2019-09-25 ENCOUNTER — Other Ambulatory Visit: Payer: Self-pay | Admitting: Oncology

## 2019-09-25 DIAGNOSIS — C61 Malignant neoplasm of prostate: Secondary | ICD-10-CM | POA: Diagnosis not present

## 2019-09-26 ENCOUNTER — Telehealth: Payer: Self-pay | Admitting: *Deleted

## 2019-09-26 DIAGNOSIS — C61 Malignant neoplasm of prostate: Secondary | ICD-10-CM | POA: Diagnosis not present

## 2019-09-26 MED FILL — ABIRATERONE ACETATE 250 MG: 250 | 30 days supply | Qty: 120 | Fill #0

## 2019-09-26 NOTE — Telephone Encounter (Signed)
Patient called - LVM: was seen in ED Sunday night for constipation and his b/p was elevated. He stated that the ED MD advised him to call his doctor and ask for b/p medication and stronger medication for constipation.  Returned patient's phone call. Patient states he does not currently take medicaine for his b/p and that his PCP has been prescribing medication for constipation. Advised patient to contact PCP to follow up on these concerns. Patient verbalized understanding and stated he would contact PCP tomorrow. Call routed to inform MD

## 2019-09-27 DIAGNOSIS — C61 Malignant neoplasm of prostate: Secondary | ICD-10-CM | POA: Diagnosis not present

## 2019-09-28 DIAGNOSIS — C61 Malignant neoplasm of prostate: Secondary | ICD-10-CM | POA: Diagnosis not present

## 2019-09-29 DIAGNOSIS — C61 Malignant neoplasm of prostate: Secondary | ICD-10-CM | POA: Diagnosis not present

## 2019-09-30 DIAGNOSIS — C61 Malignant neoplasm of prostate: Secondary | ICD-10-CM | POA: Diagnosis not present

## 2019-10-01 DIAGNOSIS — C61 Malignant neoplasm of prostate: Secondary | ICD-10-CM | POA: Diagnosis not present

## 2019-10-03 ENCOUNTER — Other Ambulatory Visit: Payer: Self-pay

## 2019-10-03 DIAGNOSIS — C61 Malignant neoplasm of prostate: Secondary | ICD-10-CM | POA: Diagnosis not present

## 2019-10-03 MED ORDER — ONDANSETRON HCL 4 MG PO TABS: 4.0000 mg | ORAL_TABLET | Freq: Three times a day (TID) | ORAL | 0 refills | Status: DC

## 2019-10-04 DIAGNOSIS — C61 Malignant neoplasm of prostate: Secondary | ICD-10-CM | POA: Diagnosis not present

## 2019-10-05 DIAGNOSIS — C61 Malignant neoplasm of prostate: Secondary | ICD-10-CM | POA: Diagnosis not present

## 2019-10-06 DIAGNOSIS — C61 Malignant neoplasm of prostate: Secondary | ICD-10-CM | POA: Diagnosis not present

## 2019-10-09 DIAGNOSIS — C61 Malignant neoplasm of prostate: Secondary | ICD-10-CM | POA: Diagnosis not present

## 2019-10-10 DIAGNOSIS — C61 Malignant neoplasm of prostate: Secondary | ICD-10-CM | POA: Diagnosis not present

## 2019-10-11 ENCOUNTER — Ambulatory Visit: Payer: Medicaid Other | Admitting: Family Medicine

## 2019-10-11 DIAGNOSIS — C61 Malignant neoplasm of prostate: Secondary | ICD-10-CM | POA: Diagnosis not present

## 2019-10-12 DIAGNOSIS — C61 Malignant neoplasm of prostate: Secondary | ICD-10-CM | POA: Diagnosis not present

## 2019-10-13 DIAGNOSIS — C61 Malignant neoplasm of prostate: Secondary | ICD-10-CM | POA: Diagnosis not present

## 2019-10-14 DIAGNOSIS — C61 Malignant neoplasm of prostate: Secondary | ICD-10-CM | POA: Diagnosis not present

## 2019-10-15 DIAGNOSIS — C61 Malignant neoplasm of prostate: Secondary | ICD-10-CM | POA: Diagnosis not present

## 2019-10-16 DIAGNOSIS — C61 Malignant neoplasm of prostate: Secondary | ICD-10-CM | POA: Diagnosis not present

## 2019-10-17 DIAGNOSIS — C61 Malignant neoplasm of prostate: Secondary | ICD-10-CM | POA: Diagnosis not present

## 2019-10-18 ENCOUNTER — Other Ambulatory Visit: Payer: Self-pay | Admitting: Oncology

## 2019-10-18 DIAGNOSIS — C61 Malignant neoplasm of prostate: Secondary | ICD-10-CM | POA: Diagnosis not present

## 2019-10-19 DIAGNOSIS — C61 Malignant neoplasm of prostate: Secondary | ICD-10-CM | POA: Diagnosis not present

## 2019-10-20 DIAGNOSIS — C61 Malignant neoplasm of prostate: Secondary | ICD-10-CM | POA: Diagnosis not present

## 2019-10-21 DIAGNOSIS — C61 Malignant neoplasm of prostate: Secondary | ICD-10-CM | POA: Diagnosis not present

## 2019-10-22 DIAGNOSIS — C61 Malignant neoplasm of prostate: Secondary | ICD-10-CM | POA: Diagnosis not present

## 2019-10-23 ENCOUNTER — Telehealth: Payer: Self-pay | Admitting: Radiation Oncology

## 2019-10-23 DIAGNOSIS — C61 Malignant neoplasm of prostate: Secondary | ICD-10-CM | POA: Diagnosis not present

## 2019-10-23 DIAGNOSIS — R338 Other retention of urine: Secondary | ICD-10-CM | POA: Diagnosis not present

## 2019-10-23 NOTE — Telephone Encounter (Signed)
Received fax refill request for patient's FLOMAX. Returned fax promptly denying refill requesting. Suggested in prescriber's comments the patient reach out to Dr. [name], his urologist, for a refill since he is no longer under the care of Dr. Tammi Klippel. Fax confirmation of delivery obtained.

## 2019-10-24 DIAGNOSIS — C61 Malignant neoplasm of prostate: Secondary | ICD-10-CM | POA: Diagnosis not present

## 2019-10-24 MED FILL — ABIRATERONE ACETATE 250 MG: 250 | 30 days supply | Qty: 120 | Fill #0

## 2019-10-25 ENCOUNTER — Other Ambulatory Visit: Payer: Self-pay | Admitting: Oncology

## 2019-10-25 ENCOUNTER — Other Ambulatory Visit: Payer: Self-pay

## 2019-10-25 DIAGNOSIS — C61 Malignant neoplasm of prostate: Secondary | ICD-10-CM | POA: Diagnosis not present

## 2019-10-25 MED ORDER — PREDNISONE 5 MG PO TABS: 5.0000 mg | ORAL_TABLET | Freq: Every day | ORAL | 3 refills | Status: DC

## 2019-10-25 MED ORDER — MORPHINE SULFATE ER 30 MG PO TBCR: 30.0000 mg | EXTENDED_RELEASE_TABLET | Freq: Two times a day (BID) | ORAL | 0 refills | Status: DC

## 2019-10-26 ENCOUNTER — Other Ambulatory Visit: Payer: Self-pay

## 2019-10-26 DIAGNOSIS — C61 Malignant neoplasm of prostate: Secondary | ICD-10-CM | POA: Diagnosis not present

## 2019-10-26 NOTE — Progress Notes (Addendum)
COVID Vaccine Completed: X1 Date COVID Vaccine completed: COVID vaccine manufacturer:     Moderna    PCP -  Angela Adam, DO last OV 09-14-19 in Epic Cardiologist - N/A Oncologist - Dr. [name]  Chest x-ray - 1V 06-18-19 EKG - 09-24-19 in Epic Stress Test - N/A ECHO - N/A Cardiac Cath - N/A  Sleep Study - N/A CPAP - N/A  Fasting Blood Sugar - N/A Checks Blood Sugar _____ times a day  Blood Thinner Instructions: N/A Aspirin Instructions: N/A Last Dose:  Anesthesia review:   Patient denies shortness of breath, fever, cough and chest pain at PAT appointment   Patient verbalized understanding of instructions that were given to them at the PAT appointment. Patient was also instructed that they will need to review over the PAT instructions again at home before surgery.

## 2019-10-27 DIAGNOSIS — C61 Malignant neoplasm of prostate: Secondary | ICD-10-CM | POA: Diagnosis not present

## 2019-10-27 MED ORDER — TRULANCE 3 MG PO TABS: 3.0000 mg | ORAL_TABLET | Freq: Every day | ORAL | 2 refills | Status: DC

## 2019-10-27 MED ORDER — TAMSULOSIN HCL 0.4 MG PO CAPS: 0.4000 mg | ORAL_CAPSULE | Freq: Every day | ORAL | 3 refills | Status: DC

## 2019-10-27 MED ORDER — MIRABEGRON ER 25 MG PO TB24: 25.0000 mg | ORAL_TABLET | Freq: Every day | ORAL | 0 refills | Status: DC

## 2019-10-27 NOTE — Patient Instructions (Addendum)
DUE TO COVID-19 ONLY ONE VISITOR ARE ALLOWED TO COME WITH YOU AND STAY IN THE WAITING ROOM ONLY DURING PRE OP AND PROCEDURE. THEN TWO VISITORS MAY VISIT WITH YOU IN YOUR PRIVATE ROOM DURING VISITING HOURS ONLY!! (10AM-8PM)   COVID SWAB TESTING MUST BE COMPLETED ON:     Saturday, 11-04-19 @ 11:05 [place], [place] -[facility] enter pre surgical testing line (Must self quarantine after testing. Follow instructions on handout.)              Your procedure is scheduled on: Wednesday, 11-08-19   Report to [facility] Main  Entrance    Report to admitting at 6:30 AM   Call this number if you have problems the morning of surgery [phone]   Do not eat food :After Midnight.   May have liquids until 5:30 AM   day of surgery   CLEAR LIQUID DIET  Foods Allowed                                                                     Foods Excluded  Water, Black Coffee and tea, regular and decaf              liquids that you cannot  Plain Jell-O in any flavor  (No red)                                     see through such as: Fruit ices (not with fruit pulp)                                      milk, soups, orange juice              Iced Popsicles (No red)                                      All solid food                                   Apple juices Sports drinks like Gatorade (No red) Lightly seasoned clear broth or consume(fat free) Sugar, honey syrup  Sample Menu Breakfast                                Lunch                                     Supper Cranberry juice                    Beef broth                            Chicken broth Jell-O  Grape juice                           Apple juice Coffee or tea                        Jell-O                                      Popsicle                                                  Coffee or tea                        Coffee or tea      Oral Hygiene is also  important to reduce your risk of infection.                                     Remember - BRUSH YOUR TEETH THE MORNING OF SURGERY WITH YOUR REGULAR TOOTHPASTE   Do NOT smoke after Midnight   Take these medicines the morning of surgery with A SIP OF WATER:  Dilaudid (if needed), MS Contin, Zofran, Trulance, Myrbetriq, Zytiga and Prednisone                                You may not have any metal on your body including jewelry, and body piercings              Do not wear lotions, powders,cologne, or deodorant             Men may shave face and neck.   Do not bring valuables to the hospital. [place].   Contacts, dentures or bridgework may not be worn into surgery.   Bring small overnight bag day of surgery.    Special Instructions: Bring a copy of your healthcare power of attorney and living will documents the day of surgery if you haven't scanned them in before.              Please read over the following fact sheets you were given: IF YOU HAVE QUESTIONS ABOUT YOUR PRE OP INSTRUCTIONS PLEASE CALL [phone]   [facility] - Preparing for Surgery Before surgery, you can play an important role.  Because skin is not sterile, your skin needs to be as free of germs as possible.  You can reduce the number of germs on your skin by washing with CHG (chlorahexidine gluconate) soap before surgery.  CHG is an antiseptic cleaner which kills germs and bonds with the skin to continue killing germs even after washing. Please DO NOT use if you have an allergy to CHG or antibacterial soaps.  If your skin becomes reddened/irritated stop using the CHG and inform your nurse when you arrive at Short Stay. Do not shave (including legs and underarms) for at least 48 hours prior to the first CHG shower.  You may shave your face/neck.  Please follow these instructions carefully:  1.  Shower with  CHG Soap the night before surgery and the  morning of surgery.  2.  If you  choose to wash your hair, wash your hair first as usual with your normal  shampoo.  3.  After you shampoo, rinse your hair and body thoroughly to remove the shampoo.                             4.  Use CHG as you would any other liquid soap.  You can apply chg directly to the skin and wash.  Gently with a scrungie or clean washcloth.  5.  Apply the CHG Soap to your body ONLY FROM THE NECK DOWN.   Do   not use on face/ open                           Wound or open sores. Avoid contact with eyes, ears mouth and   genitals (private parts).                       Wash face,  Genitals (private parts) with your normal soap.             6.  Wash thoroughly, paying special attention to the area where your    surgery  will be performed.  7.  Thoroughly rinse your body with warm water from the neck down.  8.  DO NOT shower/wash with your normal soap after using and rinsing off the CHG Soap.                9.  Pat yourself dry with a clean towel.            10.  Wear clean pajamas.            11.  Place clean sheets on your bed the night of your first shower and do not  sleep with pets. Day of Surgery : Do not apply any lotions/deodorants the morning of surgery.  Please wear clean clothes to the hospital/surgery center.  FAILURE TO FOLLOW THESE INSTRUCTIONS MAY RESULT IN THE CANCELLATION OF YOUR SURGERY  PATIENT SIGNATURE_________________________________  NURSE SIGNATURE__________________________________  ________________________________________________________________________

## 2019-10-28 DIAGNOSIS — C61 Malignant neoplasm of prostate: Secondary | ICD-10-CM | POA: Diagnosis not present

## 2019-10-29 DIAGNOSIS — C61 Malignant neoplasm of prostate: Secondary | ICD-10-CM | POA: Diagnosis not present

## 2019-10-30 DIAGNOSIS — C61 Malignant neoplasm of prostate: Secondary | ICD-10-CM | POA: Diagnosis not present

## 2019-10-31 ENCOUNTER — Encounter (HOSPITAL_COMMUNITY): Payer: Self-pay

## 2019-10-31 ENCOUNTER — Other Ambulatory Visit: Payer: Self-pay

## 2019-10-31 ENCOUNTER — Other Ambulatory Visit: Payer: Self-pay | Admitting: Oncology

## 2019-10-31 ENCOUNTER — Encounter (HOSPITAL_COMMUNITY)
Admission: RE | Admit: 2019-10-31 | Discharge: 2019-10-31 | Disposition: A | Discharge status: Home / Self Care | Payer: Medicare Other | Source: Ambulatory Visit | Attending: Urology | Admitting: Urology

## 2019-10-31 DIAGNOSIS — C61 Malignant neoplasm of prostate: Secondary | ICD-10-CM | POA: Diagnosis not present

## 2019-10-31 DIAGNOSIS — Z01812 Encounter for preprocedural laboratory examination: Secondary | ICD-10-CM | POA: Diagnosis present

## 2019-10-31 HISTORY — DX: Personal history of urinary calculi: Z87.442

## 2019-10-31 HISTORY — DX: Anemia, unspecified: D64.9

## 2019-10-31 LAB — BASIC METABOLIC PANEL
Anion gap: 7 (ref 5–15)
BUN: 15 mg/dL (ref 8–23)
CO2: 32 mmol/L (ref 22–32)
Calcium: 9.2 mg/dL (ref 8.9–10.3)
Chloride: 103 mmol/L (ref 98–111)
Creatinine, Ser: 0.82 mg/dL (ref 0.61–1.24)
GFR calc Af Amer: >60 mL/min (ref >60)
GFR calc non Af Amer: >60 mL/min (ref >60)
Glucose, Bld: 86 mg/dL (ref 70–99)
Potassium: 3.6 mmol/L (ref 3.5–5.1)
Sodium: 142 mmol/L (ref 135–145)

## 2019-10-31 LAB — CBC
HCT: 37.9 % — ABNORMAL LOW (ref 39.0–52.0)
Hemoglobin: 12.3 g/dL — ABNORMAL LOW (ref 13.0–17.0)
MCH: 30.6 pg (ref 26.0–34.0)
MCHC: 32.5 g/dL (ref 30.0–36.0)
MCV: 94.3 fL (ref 80.0–100.0)
Platelets: 271 10*3/uL (ref 150–400)
RBC: 4.02 MIL/uL — ABNORMAL LOW (ref 4.22–5.81)
RDW: 13.1 % (ref 11.5–15.5)
WBC: 5.4 10*3/uL (ref 4.0–10.5)
nRBC: 0 % (ref 0.0–0.2)

## 2019-10-31 LAB — PROTIME-INR
INR: 1 (ref 0.8–1.2)
Prothrombin Time: 12.8 seconds (ref 11.4–15.2)

## 2019-11-01 DIAGNOSIS — C61 Malignant neoplasm of prostate: Secondary | ICD-10-CM | POA: Diagnosis not present

## 2019-11-02 DIAGNOSIS — C61 Malignant neoplasm of prostate: Secondary | ICD-10-CM | POA: Diagnosis not present

## 2019-11-03 DIAGNOSIS — C61 Malignant neoplasm of prostate: Secondary | ICD-10-CM | POA: Diagnosis not present

## 2019-11-04 ENCOUNTER — Other Ambulatory Visit (HOSPITAL_COMMUNITY)
Admission: RE | Admit: 2019-11-04 | Discharge: 2019-11-04 | Disposition: A | Discharge status: Home / Self Care | Payer: Medicare Other | Source: Ambulatory Visit | Attending: Urology | Admitting: Urology

## 2019-11-04 DIAGNOSIS — Z20822 Contact with and (suspected) exposure to covid-19: Secondary | ICD-10-CM | POA: Insufficient documentation

## 2019-11-04 DIAGNOSIS — Z01818 Encounter for other preprocedural examination: Secondary | ICD-10-CM | POA: Insufficient documentation

## 2019-11-04 LAB — SARS CORONAVIRUS 2 (TAT 6-24 HRS): SARS Coronavirus 2: NEGATIVE

## 2019-11-07 NOTE — Anesthesia Preprocedure Evaluation (Addendum)
Anesthesia Evaluation  Patient identified by MRN, date of birth, ID band Patient awake    Reviewed: Allergy & Precautions, NPO status , Patient's Chart, lab work & pertinent test results  History of Anesthesia Complications Negative for: history of anesthetic complications  Airway Mallampati: II  TM Distance: >3 FB Neck ROM: Full    Dental  (+) Chipped, Missing,    Pulmonary neg pulmonary ROS,    Pulmonary exam normal        Cardiovascular negative cardio ROS Normal cardiovascular exam     Neuro/Psych negative neurological ROS  negative psych ROS   GI/Hepatic negative GI ROS, Neg liver ROS,   Endo/Other  negative endocrine ROS  Renal/GU negative Renal ROS   Metastatic prostate ca    Musculoskeletal negative musculoskeletal ROS (+)   Abdominal   Peds  Hematology  (+) anemia , Hgb 12.3   Anesthesia Other Findings Day of surgery medications reviewed with patient.  Reproductive/Obstetrics negative OB ROS                            Anesthesia Physical Anesthesia Plan  ASA: III  Anesthesia Plan: General   Post-op Pain Management:    Induction: Intravenous  PONV Risk Score and Plan: 3 and Treatment may vary due to age or medical condition, Ondansetron, Dexamethasone and Midazolam  Airway Management Planned: LMA  Additional Equipment: None  Intra-op Plan:   Post-operative Plan: Extubation in OR  Informed Consent: I have reviewed the patients History and Physical, chart, labs and discussed the procedure including the risks, benefits and alternatives for the proposed anesthesia with the patient or authorized representative who has indicated his/her understanding and acceptance.     Dental advisory given  Plan Discussed with: CRNA  Anesthesia Plan Comments:       Anesthesia Quick Evaluation

## 2019-11-08 ENCOUNTER — Ambulatory Visit (HOSPITAL_COMMUNITY): Payer: Medicare Other | Admitting: Certified Registered Nurse Anesthetist

## 2019-11-08 ENCOUNTER — Other Ambulatory Visit: Payer: Self-pay

## 2019-11-08 ENCOUNTER — Encounter (HOSPITAL_COMMUNITY)
Admission: RE | Disposition: A | Discharge status: Home / Self Care | Payer: Self-pay | Source: Ambulatory Visit | Attending: Urology

## 2019-11-08 ENCOUNTER — Encounter (HOSPITAL_COMMUNITY): Payer: Self-pay | Admitting: Urology

## 2019-11-08 ENCOUNTER — Observation Stay (HOSPITAL_COMMUNITY)
Admission: RE | Admit: 2019-11-08 | Discharge: 2019-11-09 | Disposition: A | Discharge status: Home / Self Care | Payer: Medicare Other | Source: Ambulatory Visit | Attending: Urology | Admitting: Urology

## 2019-11-08 DIAGNOSIS — Z23 Encounter for immunization: Secondary | ICD-10-CM | POA: Diagnosis not present

## 2019-11-08 DIAGNOSIS — Z85858 Personal history of malignant neoplasm of other endocrine glands: Secondary | ICD-10-CM | POA: Insufficient documentation

## 2019-11-08 DIAGNOSIS — Z85830 Personal history of malignant neoplasm of bone: Secondary | ICD-10-CM | POA: Insufficient documentation

## 2019-11-08 DIAGNOSIS — R339 Retention of urine, unspecified: Secondary | ICD-10-CM | POA: Diagnosis present

## 2019-11-08 DIAGNOSIS — N320 Bladder-neck obstruction: Secondary | ICD-10-CM | POA: Diagnosis not present

## 2019-11-08 DIAGNOSIS — C7951 Secondary malignant neoplasm of bone: Secondary | ICD-10-CM | POA: Diagnosis not present

## 2019-11-08 DIAGNOSIS — N400 Enlarged prostate without lower urinary tract symptoms: Secondary | ICD-10-CM | POA: Diagnosis present

## 2019-11-08 DIAGNOSIS — Z8546 Personal history of malignant neoplasm of prostate: Secondary | ICD-10-CM | POA: Diagnosis not present

## 2019-11-08 DIAGNOSIS — R338 Other retention of urine: Secondary | ICD-10-CM | POA: Diagnosis not present

## 2019-11-08 DIAGNOSIS — C61 Malignant neoplasm of prostate: Secondary | ICD-10-CM | POA: Diagnosis not present

## 2019-11-08 DIAGNOSIS — N401 Enlarged prostate with lower urinary tract symptoms: Secondary | ICD-10-CM | POA: Diagnosis not present

## 2019-11-08 HISTORY — PX: TRANSURETHRAL RESECTION OF PROSTATE: SHX73

## 2019-11-08 LAB — TYPE AND SCREEN
ABO/RH(D): A POS
Antibody Screen: POSITIVE
Unit division: 0
Unit division: 0

## 2019-11-08 LAB — BPAM RBC
Blood Product Expiration Date: 202108192359
Blood Product Expiration Date: 202108192359
Unit Type and Rh: 6200
Unit Type and Rh: 6200

## 2019-11-08 SURGERY — TURP (TRANSURETHRAL RESECTION OF PROSTATE)
Anesthesia: General

## 2019-11-08 MED ORDER — DIPHENHYDRAMINE HCL 50 MG/ML IJ SOLN: 12.5000 mg | Freq: Four times a day (QID) | INTRAMUSCULAR | Status: DC | PRN

## 2019-11-08 MED ORDER — PREDNISONE 5 MG PO TABS
5.0000 mg | ORAL_TABLET | Freq: Every day | ORAL | Status: DC
  Administered 2019-11-08 – 2019-11-09 (×2): 5 mg via ORAL
  Filled 2019-11-08 (×2): qty 1

## 2019-11-08 MED ORDER — EPHEDRINE SULFATE-NACL 50-0.9 MG/10ML-% IV SOSY
PREFILLED_SYRINGE | INTRAVENOUS | Status: DC | PRN
  Administered 2019-11-08: 10 mg via INTRAVENOUS
  Administered 2019-11-08: 15 mg via INTRAVENOUS

## 2019-11-08 MED ORDER — LACTATED RINGERS IV SOLN: INTRAVENOUS | Status: DC

## 2019-11-08 MED ORDER — ZOLPIDEM TARTRATE 5 MG PO TABS: 5.0000 mg | ORAL_TABLET | Freq: Every evening | ORAL | Status: DC | PRN

## 2019-11-08 MED ORDER — FENTANYL CITRATE (PF) 100 MCG/2ML IJ SOLN
INTRAMUSCULAR | Status: AC
  Filled 2019-11-08: qty 2

## 2019-11-08 MED ORDER — BELLADONNA ALKALOIDS-OPIUM 16.2-60 MG RE SUPP: 1.0000 | Freq: Four times a day (QID) | RECTAL | Status: DC | PRN

## 2019-11-08 MED ORDER — SODIUM CHLORIDE 0.9 % IR SOLN
Status: DC | PRN
  Administered 2019-11-08: 12000 mL

## 2019-11-08 MED ORDER — OXYCODONE HCL 5 MG/5ML PO SOLN: 5.0000 mg | Freq: Once | ORAL | Status: AC | PRN

## 2019-11-08 MED ORDER — ACETAMINOPHEN 500 MG PO TABS
1000.0000 mg | ORAL_TABLET | Freq: Once | ORAL | Status: AC
  Administered 2019-11-08: 1000 mg via ORAL
  Filled 2019-11-08: qty 2

## 2019-11-08 MED ORDER — SODIUM CHLORIDE 0.9 % IR SOLN
3000.0000 mL | Status: DC
  Administered 2019-11-08 (×3): 3000 mL

## 2019-11-08 MED ORDER — PLECANATIDE 3 MG PO TABS
3.0000 mg | ORAL_TABLET | Freq: Every day | ORAL | Status: DC
  Administered 2019-11-08 – 2019-11-09 (×2): 3 mg via ORAL

## 2019-11-08 MED ORDER — ONDANSETRON HCL 4 MG/2ML IJ SOLN: 4.0000 mg | INTRAMUSCULAR | Status: DC | PRN

## 2019-11-08 MED ORDER — MIDAZOLAM HCL 2 MG/2ML IJ SOLN
INTRAMUSCULAR | Status: AC
  Filled 2019-11-08: qty 2

## 2019-11-08 MED ORDER — ORAL CARE MOUTH RINSE: 15.0000 mL | Freq: Once | OROMUCOSAL | Status: AC

## 2019-11-08 MED ORDER — PROPOFOL 10 MG/ML IV BOLUS
INTRAVENOUS | Status: AC
  Filled 2019-11-08: qty 20

## 2019-11-08 MED ORDER — BACITRACIN-NEOMYCIN-POLYMYXIN 400-5-5000 EX OINT: 1.0000 "application " | TOPICAL_OINTMENT | Freq: Three times a day (TID) | CUTANEOUS | Status: DC | PRN

## 2019-11-08 MED ORDER — PROPOFOL 10 MG/ML IV BOLUS
INTRAVENOUS | Status: DC | PRN
  Administered 2019-11-08: 200 mg via INTRAVENOUS

## 2019-11-08 MED ORDER — MIRABEGRON ER 25 MG PO TB24
25.0000 mg | ORAL_TABLET | Freq: Every day | ORAL | Status: DC
  Administered 2019-11-08 – 2019-11-09 (×2): 25 mg via ORAL
  Filled 2019-11-08 (×2): qty 1

## 2019-11-08 MED ORDER — DEXAMETHASONE SODIUM PHOSPHATE 10 MG/ML IJ SOLN
INTRAMUSCULAR | Status: AC
  Filled 2019-11-08: qty 1

## 2019-11-08 MED ORDER — SENNOSIDES-DOCUSATE SODIUM 8.6-50 MG PO TABS
2.0000 | ORAL_TABLET | Freq: Every day | ORAL | Status: DC
  Administered 2019-11-08: 2 via ORAL
  Filled 2019-11-08: qty 2

## 2019-11-08 MED ORDER — OXYBUTYNIN CHLORIDE 5 MG PO TABS: 5.0000 mg | ORAL_TABLET | Freq: Three times a day (TID) | ORAL | Status: DC | PRN

## 2019-11-08 MED ORDER — CEFAZOLIN SODIUM-DEXTROSE 2-4 GM/100ML-% IV SOLN
2.0000 g | INTRAVENOUS | Status: AC
  Administered 2019-11-08: 2 g via INTRAVENOUS
  Filled 2019-11-08: qty 100

## 2019-11-08 MED ORDER — SODIUM CHLORIDE 0.9 % IV SOLN: INTRAVENOUS | Status: DC

## 2019-11-08 MED ORDER — ONDANSETRON HCL 4 MG/2ML IJ SOLN
INTRAMUSCULAR | Status: AC
  Filled 2019-11-08: qty 2

## 2019-11-08 MED ORDER — LABETALOL HCL 5 MG/ML IV SOLN
10.0000 mg | Freq: Once | INTRAVENOUS | Status: AC
  Administered 2019-11-08: 10 mg via INTRAVENOUS

## 2019-11-08 MED ORDER — PNEUMOCOCCAL VAC POLYVALENT 25 MCG/0.5ML IJ INJ
0.5000 mL | INJECTION | INTRAMUSCULAR | Status: AC
  Administered 2019-11-09: 0.5 mL via INTRAMUSCULAR
  Filled 2019-11-08: qty 0.5

## 2019-11-08 MED ORDER — OXYCODONE HCL 5 MG PO TABS
5.0000 mg | ORAL_TABLET | Freq: Once | ORAL | Status: AC | PRN
  Administered 2019-11-08: 5 mg via ORAL

## 2019-11-08 MED ORDER — CHLORHEXIDINE GLUCONATE CLOTH 2 % EX PADS: 6.0000 | MEDICATED_PAD | Freq: Every day | CUTANEOUS | Status: DC

## 2019-11-08 MED ORDER — DEXAMETHASONE SODIUM PHOSPHATE 10 MG/ML IJ SOLN
INTRAMUSCULAR | Status: DC | PRN
Start: 2019-11-08 — End: 2019-11-08
  Administered 2019-11-08: 5 mg via INTRAVENOUS

## 2019-11-08 MED ORDER — ACETAMINOPHEN 325 MG PO TABS: 650.0000 mg | ORAL_TABLET | ORAL | Status: DC | PRN

## 2019-11-08 MED ORDER — FENTANYL CITRATE (PF) 100 MCG/2ML IJ SOLN
INTRAMUSCULAR | Status: DC | PRN
  Administered 2019-11-08 (×2): 25 ug via INTRAVENOUS
  Administered 2019-11-08 (×2): 50 ug via INTRAVENOUS

## 2019-11-08 MED ORDER — HYDROMORPHONE HCL 1 MG/ML IJ SOLN: 0.5000 mg | INTRAMUSCULAR | Status: DC | PRN

## 2019-11-08 MED ORDER — OXYCODONE HCL 5 MG PO TABS
ORAL_TABLET | ORAL | Status: AC
  Filled 2019-11-08: qty 1

## 2019-11-08 MED ORDER — PROMETHAZINE HCL 25 MG/ML IJ SOLN: 6.2500 mg | INTRAMUSCULAR | Status: DC | PRN

## 2019-11-08 MED ORDER — LIDOCAINE 2% (20 MG/ML) 5 ML SYRINGE
INTRAMUSCULAR | Status: DC | PRN
  Administered 2019-11-08: 100 mg via INTRAVENOUS

## 2019-11-08 MED ORDER — HYDROMORPHONE HCL 2 MG PO TABS
4.0000 mg | ORAL_TABLET | ORAL | Status: DC | PRN
  Administered 2019-11-08: 4 mg via ORAL
  Filled 2019-11-08: qty 2

## 2019-11-08 MED ORDER — ONDANSETRON HCL 4 MG/2ML IJ SOLN
INTRAMUSCULAR | Status: DC | PRN
  Administered 2019-11-08: 4 mg via INTRAVENOUS

## 2019-11-08 MED ORDER — MORPHINE SULFATE ER 30 MG PO TBCR
30.0000 mg | EXTENDED_RELEASE_TABLET | Freq: Once | ORAL | Status: AC
  Administered 2019-11-08: 30 mg via ORAL
  Filled 2019-11-08: qty 1

## 2019-11-08 MED ORDER — ABIRATERONE ACETATE 250 MG PO TABS
1000.0000 mg | ORAL_TABLET | Freq: Every day | ORAL | Status: DC
  Administered 2019-11-08 – 2019-11-09 (×2): 1000 mg via ORAL

## 2019-11-08 MED ORDER — POLYETHYLENE GLYCOL 3350 17 G PO PACK: 17.0000 g | PACK | Freq: Every day | ORAL | Status: DC | PRN

## 2019-11-08 MED ORDER — DIPHENHYDRAMINE HCL 12.5 MG/5ML PO ELIX: 12.5000 mg | ORAL_SOLUTION | Freq: Four times a day (QID) | ORAL | Status: DC | PRN

## 2019-11-08 MED ORDER — MIDAZOLAM HCL 5 MG/5ML IJ SOLN
INTRAMUSCULAR | Status: DC | PRN
  Administered 2019-11-08 (×2): 1 mg via INTRAVENOUS

## 2019-11-08 MED ORDER — FENTANYL CITRATE (PF) 100 MCG/2ML IJ SOLN
25.0000 ug | INTRAMUSCULAR | Status: DC | PRN
  Administered 2019-11-08 (×3): 50 ug via INTRAVENOUS

## 2019-11-08 MED ORDER — LIDOCAINE 2% (20 MG/ML) 5 ML SYRINGE
INTRAMUSCULAR | Status: AC
  Filled 2019-11-08: qty 5

## 2019-11-08 MED ORDER — MORPHINE SULFATE ER 30 MG PO TBCR
30.0000 mg | EXTENDED_RELEASE_TABLET | Freq: Two times a day (BID) | ORAL | Status: DC
  Administered 2019-11-08 – 2019-11-09 (×2): 30 mg via ORAL
  Filled 2019-11-08 (×2): qty 1

## 2019-11-08 MED ORDER — LABETALOL HCL 5 MG/ML IV SOLN
INTRAVENOUS | Status: AC
  Filled 2019-11-08: qty 4

## 2019-11-08 MED ORDER — CHLORHEXIDINE GLUCONATE 0.12 % MT SOLN
15.0000 mL | Freq: Once | OROMUCOSAL | Status: AC
  Administered 2019-11-08: 15 mL via OROMUCOSAL

## 2019-11-08 SURGICAL SUPPLY — 20 items
BAG DRN RND TRDRP ANRFLXCHMBR (UROLOGICAL SUPPLIES) ×1
BAG URINE DRAIN 2000ML AR STRL (UROLOGICAL SUPPLIES) ×3 IMPLANT
BAG URO CATCHER STRL LF (MISCELLANEOUS) ×3 IMPLANT
CATH FOLEY 3WAY 30CC 24FR (CATHETERS) ×3
CATH URTH STD 24FR FL 3W 2 (CATHETERS) ×1 IMPLANT
CLOTH BEACON ORANGE TIMEOUT ST (SAFETY) ×3 IMPLANT
ELECT REM PT RETURN 15FT ADLT (MISCELLANEOUS) ×3 IMPLANT
GLOVE BIO SURGEON STRL SZ7.5 (GLOVE) ×3 IMPLANT
GOWN STRL REUS W/TWL XL LVL3 (GOWN DISPOSABLE) ×3 IMPLANT
HOLDER FOLEY CATH W/STRAP (MISCELLANEOUS) ×3 IMPLANT
KIT TURNOVER KIT A (KITS) IMPLANT
LOOP CUT BIPOLAR 24F LRG (ELECTROSURGICAL) ×3 IMPLANT
MANIFOLD NEPTUNE II (INSTRUMENTS) ×3 IMPLANT
PACK CYSTO (CUSTOM PROCEDURE TRAY) ×3 IMPLANT
PENCIL SMOKE EVACUATOR (MISCELLANEOUS) IMPLANT
SYR TOOMEY IRRIG 70ML (MISCELLANEOUS) ×3
SYRINGE TOOMEY IRRIG 70ML (MISCELLANEOUS) ×1 IMPLANT
TUBING CONNECTING 10 (TUBING) ×2 IMPLANT
TUBING CONNECTING 10' (TUBING) ×1
TUBING UROLOGY SET (TUBING) ×3 IMPLANT

## 2019-11-08 NOTE — H&P (Signed)
CC/HPI: Pt presents today for pre-operative history and physical exam in anticipation of TURP by Dr. [name] on 11/08/19. He is doing well overall except for hot flashes. His cath has been functioning well. Pt denies F/C, HA, CP, SOB, N/V, diarrhea, back pain, and flank pain.    HX:   Referral from Dr. Matilde Sprang. Patient was recently seen by him for urinary retention. The patient originally went to the emergency department on 02/20/2019 with inability to void. He underwent a CT scan of the abdomen and pelvis that revealed a markedly enlarged heterogeneous prostate that was about 140 g. This is causing significant mass effect into the bladder. He also was noted to have multifocal pelvic and retroperitoneal lymphadenopathy he also had multiple sclerotic foci within the spine and pelvis. He has not had a PSA performed. Patient was started on Flomax. He underwent a voiding trial but is now having nocturia x5 and urinary frequency every 15 minutes. He also has significant obstructive voiding complaints.   03/14/2019  PSA was 366. He is doing okay since prostate biopsy. He continues with Foley catheter. Unfortunately, pathology revealed prostate cancer in all 12 cores. Maximum Gleason score was 9. As mentioned above, he has significant lymphadenopathy as well as multiple foci of sclerotic lesions in the spine and pelvis.   04/12/2019  In the interval, the patient saw Dr [name]. He remains undecided regarding any treatment. Despite encouragement to start androgen deprivation therapy, he has declined today. He would like to have a voiding trial though he has tried multiple times and has failed multiple times. He currently declines TURP. The patient has undergone a bone scan in the interval which showed multifocal uptake within the lower thoracic spine and pelvis. He also went to the emergency department on 04/08/2019 for abdominal pain and had a CT scan that redemonstrated extensive bulky retroperitoneal and pelvic  lymphadenopathy.   05/03/19: Patient with above-noted history. He has been seen by Dr. Tammi Klippel in the interval. He has also seen Dr. [name] he recommended a form of androgen deprivation therapy followed by [facility] for treatment. However, patient remains undecided between orchiectomy or chemical castration. Per our conversation today he has not reached a decision, but plans to prior to his follow up with Dr. [name]. His most recent PSA has doubled to 632 on 04/11/19. He is currently undergoing 2 week course of daily pallative external beam radiation to the thoracic spine, which began on 04/21/2019. He presents today with concerns regarding his Foley catheter. He has noted some intermittent bleeding from around the catheter for the past 2 days. This has improved today. He denies clots. He states that his catheter has been draining well without significant hematuria noted in the drainage bag. He denies painful urgency, but will have some spasms intermittently. He denies fevers or chills. He is currently having significant amount of back pain, which he is managing with oxycodone.   05/31/2019  Patient remains in urinary retention. He failed voiding trials. He is still undecided regarding treatment of his enlarged prostate/retention with TURP. He saw Dr. [name] today and elected to proceed with [facility]. This was given under the direction of Dr. [name].   07/19/2019  Patient continues on [place] under the direction of Dr. [name]. He continues with urinary retention. Most recent PSA was 121. He would like to proceed with TURP.     ALLERGIES: Ibuprofen - Swelling, glossy eyes     Notes: Shrimp-difficulty breathing and facial edema   MEDICATIONS: Oxybutynin Chloride  Tamsulosin Hcl  0.4 mg capsule  Hydromorphone Hcl  Miralax  Morphine Sulfate     Notes: Prednisone 5mg  QD  [name]  Trulace for constipation QD  [place] and dulcolax softers  Mirbetreq    GU PSH: Cystoscopy - 07/19/2019 Prostate Needle  Biopsy - 03/09/2019     NON-GU PSH: Surgical Pathology, Gross And Microscopic Examination For Prostate Needle - 03/09/2019     GU PMH: Prostate Cancer - 03/14/2019 Incomplete bladder emptying (Stable) - 03/08/2019, - 03/01/2019 Metastatic pelvic lymphadenopathy - 03/08/2019 Nocturia - 03/08/2019 Secondary bone metastases - 03/08/2019 Urinary Retention - 03/08/2019 Urinary Urgency - 03/08/2019 Weak Urinary Stream (Stable) - 03/08/2019, - 03/01/2019 Prostate nodule w/ LUTS - 03/01/2019    NON-GU PMH: None   FAMILY HISTORY: None   SOCIAL HISTORY: Marital Status: Divorced Preferred Language: English; Ethnicity: Not Hispanic Or Latino; Race: Black or African American Current Smoking Status: Patient has never smoked.   Tobacco Use Assessment Completed: Used Tobacco in last 30 days? Does not use smokeless tobacco. Has never drank.  Does not use drugs. Does not drink caffeine. Has not had a blood transfusion.    REVIEW OF SYSTEMS:    GU Review Male:   Patient denies frequent urination, hard to postpone urination, burning/ pain with urination, get up at night to urinate, leakage of urine, stream starts and stops, trouble starting your stream, have to strain to urinate , erection problems, and penile pain.  Gastrointestinal (Upper):   Patient denies nausea, vomiting, and indigestion/ heartburn.  Gastrointestinal (Lower):   Patient reports constipation. Patient denies diarrhea.  Constitutional:   Patient reports fatigue. Patient denies fever, night sweats, and weight loss.  Skin:   Patient denies skin rash/ lesion and itching.  Eyes:   Patient denies blurred vision and double vision.  Ears/ Nose/ Throat:   Patient denies sore throat and sinus problems.  Hematologic/Lymphatic:   Patient denies swollen glands and easy bruising.  Cardiovascular:   Patient denies leg swelling and chest pains.  Respiratory:   Patient denies cough and shortness of breath.  Endocrine:   Patient denies excessive  thirst.  Musculoskeletal:   Patient denies back pain and joint pain.  Neurological:   Patient denies headaches and dizziness.  Psychologic:   Patient denies depression and anxiety.   VITAL SIGNS:      10/31/2019 01:41 PM  Weight 209 lb / 94.8 kg  Height 68 in / 172.72 cm  BP 207/80 mmHg  Pulse 57 /min  Temperature 96.9 F / 36.0 C  BMI 31.8 kg/m   MULTI-SYSTEM PHYSICAL EXAMINATION:    Constitutional: Well-nourished. No physical deformities. Normally developed. Good grooming.  Neck: Neck symmetrical, not swollen. Normal tracheal position.  Respiratory: Normal breath sounds. No labored breathing, no use of accessory muscles.   Cardiovascular: Regular rate and rhythm. No murmur, no gallop.   Lymphatic: No enlargement of neck, axillae, groin.  Skin: No paleness, no jaundice, no cyanosis. No lesion, no ulcer, no rash.  Neurologic / Psychiatric: Oriented to time, oriented to place, oriented to person. No depression, no anxiety, no agitation.  Gastrointestinal: No mass, no tenderness, no rigidity, obese abdomen.   Eyes: Normal conjunctivae. Normal eyelids.  Ears, Nose, Mouth, and Throat: Left ear no scars, no lesions, no masses. Right ear no scars, no lesions, no masses. Nose no scars, no lesions, no masses. Normal hearing. Normal lips.  Musculoskeletal: Normal gait and station of head and neck.     Complexity of Data:  Records Review:   Previous Patient Records  Urine Test Review:   Urinalysis   10/31/19  Urinalysis  Urine Appearance Slightly Cloudy   Urine Color Straw   Urine Glucose Neg mg/dL  Urine Bilirubin Neg mg/dL  Urine Ketones Neg mg/dL  Urine Specific Gravity 1.010   Urine Blood Neg ery/uL  Urine pH 7.0   Urine Protein Trace mg/dL  Urine Urobilinogen 0.2 mg/dL  Urine Nitrites Positive   Urine Leukocyte Esterase 2+ leu/uL  Urine WBC/hpf 6 - 10/hpf   Urine RBC/hpf 0 - 2/hpf   Urine Epithelial Cells 0 - 5/hpf   Urine Bacteria Many (>50/hpf)   Urine Mucous Not Present    Urine Yeast NS (Not Seen)   Urine Trichomonas Not Present   Urine Cystals NS (Not Seen)   Urine Casts NS (Not Seen)   Urine Sperm Not Present    PROCEDURES:          Urinalysis w/Scope - 81001 Dipstick Dipstick Cont'd Micro  Color: Straw Bilirubin: Neg mg/dL WBC/hpf: 6 - 10/hpf  Appearance: Slightly Cloudy Ketones: Neg mg/dL RBC/hpf: 0 - 2/hpf  Specific Gravity: 1.010 Blood: Neg ery/uL Bacteria: Many (>50/hpf)  pH: 7.0 Protein: Trace mg/dL Cystals: NS (Not Seen)  Glucose: Neg mg/dL Urobilinogen: 0.2 mg/dL Casts: NS (Not Seen)    Nitrites: Positive Trichomonas: Not Present    Leukocyte Esterase: 2+ leu/uL Mucous: Not Present      Epithelial Cells: 0 - 5/hpf      Yeast: NS (Not Seen)      Sperm: Not Present    Notes: Microscopic not concentrated.    ASSESSMENT:      ICD-10 Details  1 GU:   Urinary Retention - R33.8    PLAN:           Orders Labs CULTURE, URINE          Schedule Return Visit/Planned Activity: Keep Scheduled Appointment - Schedule Surgery          Document Letter(s):  Created for Patient: Clinical Summary         Notes:   There are no changes in the patients history or physical exam since last evaluation by Dr. [name]. Pt is scheduled to undergo TURP on 11/08/19.   Urine appears infected but pt is asymptomatic. Will check culture and treat if positive. If culture shows mixed flora will treat with Cipro.   All pt's questions were answered to the best of my ability.          Next Appointment:      Next Appointment: 11/08/2019 08:30 AM    Appointment Type: Surgery     Location: [facility] [phone]    Provider: Link Snuffer, III, M.D.    Reason for Visit: OBS WL TURP     Signed by Mcarthur Rossetti, PA on 10/31/19 at 3:22 PM (EDT

## 2019-11-08 NOTE — Discharge Instructions (Signed)

## 2019-11-08 NOTE — Anesthesia Procedure Notes (Signed)
Procedure Name: LMA Insertion Date/Time: 11/08/2019 8:43 AM Performed by: Maxwell Caul, CRNA Pre-anesthesia Checklist: Patient identified, Emergency Drugs available, Suction available and Patient being monitored Patient Re-evaluated:Patient Re-evaluated prior to induction Oxygen Delivery Method: Circle system utilized Preoxygenation: Pre-oxygenation with 100% oxygen Induction Type: IV induction LMA: LMA inserted LMA Size: 4.0 Number of attempts: 1 Placement Confirmation: positive ETCO2 and breath sounds checked- equal and bilateral Tube secured with: Tape Dental Injury: Teeth and Oropharynx as per pre-operative assessment

## 2019-11-08 NOTE — Anesthesia Postprocedure Evaluation (Signed)
Anesthesia Post Note  Patient: Andre Glass  Procedure(s) Performed: TRANSURETHRAL RESECTION OF THE PROSTATE (TURP) (N/A )     Patient location during evaluation: PACU Anesthesia Type: General Level of consciousness: awake and alert and oriented Pain management: pain level controlled Vital Signs Assessment: post-procedure vital signs reviewed and stable Respiratory status: spontaneous breathing, nonlabored ventilation and respiratory function stable Cardiovascular status: blood pressure returned to baseline Postop Assessment: no apparent nausea or vomiting Anesthetic complications: no   No complications documented.             Brennan Bailey

## 2019-11-08 NOTE — Op Note (Signed)
Preoperative diagnosis: 1. Bladder outlet obstruction secondary to BPH, history of metastatic prostate cancer  Postoperative diagnosis:  1. Bladder outlet obstruction secondary to BPH, history of metastatic prostate cancer  Procedure:  1. Cystoscopy 2. Transurethral resection of the prostate  Surgeon: Marton Redwood, III. M.D.  Anesthesia: General  Complications: None  EBL: Minimal  Specimens: 1. Prostate chips  Indication: Andre Glass is a patient with bladder outlet obstruction secondary to benign prostatic hyperplasia.  He also has a history of advanced metastatic prostate cancer.  After reviewing the management options for treatment, he elected to proceed with the above surgical procedure(s). We have discussed the potential benefits and risks of the procedure, side effects of the proposed treatment, the likelihood of the patient achieving the goals of the procedure, and any potential problems that might occur during the procedure or recuperation. Informed consent has been obtained.  Description of procedure:  The patient was taken to the operating room and general anesthesia was induced.  The patient was placed in the dorsal lithotomy position, prepped and draped in the usual sterile fashion, and preoperative antibiotics were administered. A preoperative time-out was performed.   Cystourethroscopy was performed.  The patient's anterior urethra was normal.  The prostate itself demonstrated bilobar hypertrophy.  At the bladder neck, there was nodularity consistent with extension of prostate cancer.  I was unable to visualize the ureteral orifices and so I tried to avoid excessive resection around the bladder neck and avoided the area of the expected location of the ureteral orifices.  The bladder was then systematically examined in its entirety. There was no evidence of any bladder tumors, stones, or other mucosal pathology.  The prostate adenoma was then resected utilizing loop  cautery resection with the bipolar cutting loop.  The prostate adenoma from the bladder neck back to the verumontanum was resected beginning at the six o'clock position and then extended to include the right and left lobes of the prostate and anterior prostate. Care was taken not to resect distal to the verumontanum.  Hemostasis was then achieved with the cautery and the bladder was emptied and reinspected with no significant bleeding noted at the end of the procedure.    A 24 French 3 way catheter was then placed into the bladder.  The patient appeared to tolerate the procedure well and without complications.  The patient was able to be awakened and transferred to the recovery unit in satisfactory condition.

## 2019-11-08 NOTE — Transfer of Care (Signed)
Immediate Anesthesia Transfer of Care Note  Patient: Andre Glass  Procedure(s) Performed: TRANSURETHRAL RESECTION OF THE PROSTATE (TURP) (N/A )  Patient Location: PACU  Anesthesia Type:General  Level of Consciousness: awake, alert  and oriented  Airway & Oxygen Therapy: Patient Spontanous Breathing and Patient connected to face mask oxygen  Post-op Assessment: Report given to RN and Post -op Vital signs reviewed and stable  Post vital signs: Reviewed and stable  Last Vitals:  Vitals Value Taken Time  BP    Temp    Pulse    Resp    SpO2      Last Pain:  Vitals:   11/08/19 0647  TempSrc:   PainSc: 3       Patients Stated Pain Goal: 4 (47/65/46 [id number])  Complications: No complications documented.

## 2019-11-09 ENCOUNTER — Encounter (HOSPITAL_COMMUNITY): Payer: Self-pay | Admitting: Urology

## 2019-11-09 DIAGNOSIS — N320 Bladder-neck obstruction: Secondary | ICD-10-CM | POA: Diagnosis not present

## 2019-11-09 LAB — SURGICAL PATHOLOGY

## 2019-11-10 NOTE — Discharge Summary (Signed)
Physician Discharge Summary  [name] MRN: [id number] DOB/AGE: December 10, 1950 69 y.o.  Admit date: 11/08/2019 Discharge date: 11/09/2019  Admission Diagnoses:  Discharge Diagnoses:  Active Problems:   BPH (benign prostatic hyperplasia)   Discharged Condition: good  Hospital Course: Patient underwent TURP.  He remained overnight on CBI for observation.  The following day, CBI was weaned.  He was discharged home in stable condition  Consults: None  Significant Diagnostic Studies: None  Treatments: surgery: As above  Discharge Exam: Blood pressure (!) 187/85, pulse (!) 55, temperature 97.8 F (36.6 C), temperature source Oral, resp. rate 18, height 5\' 7"  (1.702 m), weight 95 kg, SpO2 100 %. General appearance: alert no acute distress Abdomen soft, nontender, nondistended Three-way Foley catheter in place light pink off CBI  Disposition: Discharge disposition: 01-Home or Self Care        Allergies as of 11/09/2019      Reactions   Ibuprofen Anaphylaxis   Shrimp [shellfish Allergy] Anaphylaxis      Medication List    TAKE these medications   abiraterone acetate 250 MG tablet Commonly known as: ZYTIGA TAKE 4 TABLETS (1,000 MG TOTAL) BY MOUTH DAILY. TAKE ON AN EMPTY STOMACH 1 HOUR BEFORE OR 2 HOURS AFTER A MEAL   acetaminophen 325 MG tablet Commonly known as: TYLENOL Take 650 mg by mouth every 6 (six) hours as needed for mild pain.   Biotene OralBalance Dry Mouth Gel Use as directed 1 Dose in the mouth or throat 4 (four) times daily -  before meals and at bedtime. What changed:   when to take this  reasons to take this   docusate sodium 100 MG capsule Commonly known as: COLACE Take 100 mg by mouth daily as needed for mild constipation or moderate constipation.   HYDROmorphone 4 MG tablet Commonly known as: Dilaudid Take 1 tablet (4 mg total) by mouth every 4 (four) hours as needed for severe pain.   mirabegron ER 25 MG Tb24 tablet Commonly known  as: Myrbetriq Take 1 tablet (25 mg total) by mouth daily.   morphine 30 MG 12 hr tablet Commonly known as: MS CONTIN Take 1 tablet (30 mg total) by mouth every 12 (twelve) hours.   ondansetron 4 MG tablet Commonly known as: ZOFRAN TAKE 1 TABLET (4 MG TOTAL) BY MOUTH 3 (THREE) TIMES DAILY.   phenazopyridine 100 MG tablet Commonly known as: Pyridium Take 1 tablet (100 mg total) by mouth 3 (three) times daily as needed for pain.   polyethylene glycol 17 g packet Commonly known as: MiraLax Take 17 g by mouth daily. What changed:   when to take this  reasons to take this   predniSONE 5 MG tablet Commonly known as: DELTASONE Take 1 tablet (5 mg total) by mouth daily with breakfast.   Pseudoephedrine HCl 30 MG/5ML Liqd Commonly known as: SUDAFED Take 30 mg by mouth 4 (four) times daily as needed for congestion.   senna 8.6 MG Tabs tablet Commonly known as: SENOKOT Take 1 tablet by mouth daily as needed for mild constipation.   sulfamethoxazole-trimethoprim 800-160 MG tablet Commonly known as: BACTRIM DS Take 1 tablet by mouth 2 (two) times daily.   tamsulosin 0.4 MG Caps capsule Commonly known as: FLOMAX Take 1 capsule (0.4 mg total) by mouth daily after supper.   Trulance 3 MG Tabs Generic drug: Plecanatide Take 3 mg by mouth daily.        Signed: Marton Redwood, III 11/10/2019, 4:21 PM

## 2019-11-12 LAB — TYPE AND SCREEN
ABO/RH(D): A POS
Antibody Screen: NEGATIVE
Unit division: 0
Unit division: 0

## 2019-11-12 LAB — BPAM RBC
Blood Product Expiration Date: 202108192359
Blood Product Expiration Date: 202108192359
Unit Type and Rh: 6200
Unit Type and Rh: 6200

## 2019-11-15 DIAGNOSIS — C61 Malignant neoplasm of prostate: Secondary | ICD-10-CM | POA: Diagnosis not present

## 2019-11-16 ENCOUNTER — Other Ambulatory Visit: Payer: Self-pay | Admitting: Oncology

## 2019-11-16 DIAGNOSIS — C61 Malignant neoplasm of prostate: Secondary | ICD-10-CM | POA: Diagnosis not present

## 2019-11-17 DIAGNOSIS — C61 Malignant neoplasm of prostate: Secondary | ICD-10-CM | POA: Diagnosis not present

## 2019-11-18 DIAGNOSIS — C61 Malignant neoplasm of prostate: Secondary | ICD-10-CM | POA: Diagnosis not present

## 2019-11-19 DIAGNOSIS — C61 Malignant neoplasm of prostate: Secondary | ICD-10-CM | POA: Diagnosis not present

## 2019-11-20 DIAGNOSIS — C61 Malignant neoplasm of prostate: Secondary | ICD-10-CM | POA: Diagnosis not present

## 2019-11-21 DIAGNOSIS — C61 Malignant neoplasm of prostate: Secondary | ICD-10-CM | POA: Diagnosis not present

## 2019-11-22 DIAGNOSIS — C61 Malignant neoplasm of prostate: Secondary | ICD-10-CM | POA: Diagnosis not present

## 2019-11-23 DIAGNOSIS — C61 Malignant neoplasm of prostate: Secondary | ICD-10-CM | POA: Diagnosis not present

## 2019-11-24 DIAGNOSIS — C61 Malignant neoplasm of prostate: Secondary | ICD-10-CM | POA: Diagnosis not present

## 2019-11-25 DIAGNOSIS — C61 Malignant neoplasm of prostate: Secondary | ICD-10-CM | POA: Diagnosis not present

## 2019-11-26 DIAGNOSIS — C61 Malignant neoplasm of prostate: Secondary | ICD-10-CM | POA: Diagnosis not present

## 2019-11-27 DIAGNOSIS — C61 Malignant neoplasm of prostate: Secondary | ICD-10-CM | POA: Diagnosis not present

## 2019-11-28 DIAGNOSIS — C61 Malignant neoplasm of prostate: Secondary | ICD-10-CM | POA: Diagnosis not present

## 2019-11-29 ENCOUNTER — Inpatient Hospital Stay: Payer: Medicare Other | Attending: Oncology

## 2019-11-29 ENCOUNTER — Inpatient Hospital Stay (HOSPITAL_BASED_OUTPATIENT_CLINIC_OR_DEPARTMENT_OTHER): Payer: Medicare Other | Admitting: Oncology

## 2019-11-29 ENCOUNTER — Other Ambulatory Visit: Payer: Self-pay

## 2019-11-29 ENCOUNTER — Inpatient Hospital Stay: Payer: Medicare Other

## 2019-11-29 VITALS — BP 196/83 | HR 57 | Temp 96.4°F | Resp 18 | Ht 67.0 in | Wt 214.8 lb

## 2019-11-29 DIAGNOSIS — K5900 Constipation, unspecified: Secondary | ICD-10-CM | POA: Insufficient documentation

## 2019-11-29 DIAGNOSIS — Z7952 Long term (current) use of systemic steroids: Secondary | ICD-10-CM | POA: Insufficient documentation

## 2019-11-29 DIAGNOSIS — C61 Malignant neoplasm of prostate: Secondary | ICD-10-CM

## 2019-11-29 DIAGNOSIS — C7951 Secondary malignant neoplasm of bone: Secondary | ICD-10-CM | POA: Insufficient documentation

## 2019-11-29 DIAGNOSIS — Z923 Personal history of irradiation: Secondary | ICD-10-CM | POA: Insufficient documentation

## 2019-11-29 DIAGNOSIS — Z79899 Other long term (current) drug therapy: Secondary | ICD-10-CM | POA: Insufficient documentation

## 2019-11-29 DIAGNOSIS — R197 Diarrhea, unspecified: Secondary | ICD-10-CM | POA: Insufficient documentation

## 2019-11-29 DIAGNOSIS — Z79818 Long term (current) use of other agents affecting estrogen receptors and estrogen levels: Secondary | ICD-10-CM | POA: Diagnosis not present

## 2019-11-29 DIAGNOSIS — G893 Neoplasm related pain (acute) (chronic): Secondary | ICD-10-CM | POA: Insufficient documentation

## 2019-11-29 DIAGNOSIS — R338 Other retention of urine: Secondary | ICD-10-CM | POA: Diagnosis not present

## 2019-11-29 LAB — CMP (CANCER CENTER ONLY)
ALT: <6 U/L (ref 0–44)
AST: 10 U/L — ABNORMAL LOW (ref 15–41)
Albumin: 3.3 g/dL — ABNORMAL LOW (ref 3.5–5.0)
Alkaline Phosphatase: 77 U/L (ref 38–126)
Anion gap: 6 (ref 5–15)
BUN: 16 mg/dL (ref 8–23)
CO2: 30 mmol/L (ref 22–32)
Calcium: 10.1 mg/dL (ref 8.9–10.3)
Chloride: 106 mmol/L (ref 98–111)
Creatinine: 1.35 mg/dL — ABNORMAL HIGH (ref 0.61–1.24)
GFR, Est AFR Am: >60 mL/min (ref >60)
GFR, Estimated: 53 mL/min — ABNORMAL LOW (ref >60)
Glucose, Bld: 121 mg/dL — ABNORMAL HIGH (ref 70–99)
Potassium: 3.5 mmol/L (ref 3.5–5.1)
Sodium: 142 mmol/L (ref 135–145)
Total Bilirubin: 0.3 mg/dL (ref 0.3–1.2)
Total Protein: 7.8 g/dL (ref 6.5–8.1)

## 2019-11-29 LAB — CBC WITH DIFFERENTIAL (CANCER CENTER ONLY)
Abs Immature Granulocytes: 0.05 10*3/uL (ref 0.00–0.07)
Basophils Absolute: 0 10*3/uL (ref 0.0–0.1)
Basophils Relative: 1 %
Eosinophils Absolute: 0.2 10*3/uL (ref 0.0–0.5)
Eosinophils Relative: 3 %
HCT: 35.2 % — ABNORMAL LOW (ref 39.0–52.0)
Hemoglobin: 11.5 g/dL — ABNORMAL LOW (ref 13.0–17.0)
Immature Granulocytes: 1 %
Lymphocytes Relative: 22 %
Lymphs Abs: 1.3 10*3/uL (ref 0.7–4.0)
MCH: 30.6 pg (ref 26.0–34.0)
MCHC: 32.7 g/dL (ref 30.0–36.0)
MCV: 93.6 fL (ref 80.0–100.0)
Monocytes Absolute: 0.5 10*3/uL (ref 0.1–1.0)
Monocytes Relative: 8 %
Neutro Abs: 3.7 10*3/uL (ref 1.7–7.7)
Neutrophils Relative %: 65 %
Platelet Count: 290 10*3/uL (ref 150–400)
RBC: 3.76 MIL/uL — ABNORMAL LOW (ref 4.22–5.81)
RDW: 12.1 % (ref 11.5–15.5)
WBC Count: 5.6 10*3/uL (ref 4.0–10.5)
nRBC: 0 % (ref 0.0–0.2)

## 2019-11-29 MED ORDER — LEUPROLIDE ACETATE (4 MONTH) 30 MG ~~LOC~~ KIT
30.0000 mg | PACK | Freq: Once | SUBCUTANEOUS | Status: AC
  Administered 2019-11-29: 30 mg via SUBCUTANEOUS

## 2019-11-29 MED ORDER — LEUPROLIDE ACETATE (4 MONTH) 30 MG ~~LOC~~ KIT
PACK | SUBCUTANEOUS | Status: AC
  Filled 2019-11-29: qty 30

## 2019-11-29 MED FILL — ABIRATERONE ACETATE 250 MG: 250 | 30 days supply | Qty: 120 | Fill #0

## 2019-11-29 NOTE — Progress Notes (Signed)
Hematology and Oncology Follow Up Visit  Andre Glass [id number] 07-09-1950 69 y.o. 11/29/2019 12:58 PM Andre Glass, DOWelborn, [name], DO   Principle Diagnosis: 69 year old man with castration-sensitive advanced prostate cancer diagnosed in December 2020.  He presented with Gleason score 4+5 = 9 and a PSA of 366.   Prior Therapy: He is status post prostate biopsy completed on December 3 of 2020.  Pathology showed a Gleason score 4+5 = 9 with 12 out of 12 cores.  He is status post both of radiation therapy to the spine between T9 and L1.  He received 30 Gray in 10 fractions between January 15 and 28, 2021.   Firmagon 240 mg started on May 31, 2019.   Current therapy:   Eligard every 4 months starting in April 2021.  Next Eligard will be given in August 2021.  Zytiga 1000 mg daily with prednisone 5 mg daily started in May 2021.  Interim History: [name] is here for a follow-up visit.  Since last visit, he reports no major changes in his health.  He continues to tolerate Zytiga and prednisone without any new side effects.  He denies any nausea vomiting or abdominal pain.  He denies any worsening edema or fatigue.  He underwent TURP procedure under the care of Dr. [name] with improvement in his urination.  He continues to report some instability with ambulation but does use a walker and a cane at home.  He denies any recent falls or syncope.  His pain is reasonably controlled currently with morphine with very little breakthrough Dilaudid.    Medications: Updated on review. Current Outpatient Medications  Medication Sig Dispense Refill  . abiraterone acetate (ZYTIGA) 250 MG tablet TAKE 4 TABLETS (1,000 MG TOTAL) BY MOUTH DAILY. TAKE ON AN EMPTY STOMACH 1 HOUR BEFORE OR 2 HOURS AFTER A MEAL 120 tablet 0  . acetaminophen (TYLENOL) 325 MG tablet Take 650 mg by mouth every 6 (six) hours as needed for mild pain.    . Artificial Saliva (BIOTENE ORALBALANCE DRY MOUTH) GEL Use as directed 1  Dose in the mouth or throat 4 (four) times daily -  before meals and at bedtime. (Patient taking differently: Use as directed 1 Dose in the mouth or throat 4 (four) times daily as needed (dry mouth). ) 42 g 5  . docusate sodium (COLACE) 100 MG capsule Take 100 mg by mouth daily as needed for mild constipation or moderate constipation.    Marland Kitchen HYDROmorphone (DILAUDID) 4 MG tablet Take 1 tablet (4 mg total) by mouth every 4 (four) hours as needed for severe pain. 60 tablet 0  . mirabegron ER (MYRBETRIQ) 25 MG TB24 tablet Take 1 tablet (25 mg total) by mouth daily. 30 tablet 0  . morphine (MS CONTIN) 30 MG 12 hr tablet Take 1 tablet (30 mg total) by mouth every 12 (twelve) hours. 60 tablet 0  . ondansetron (ZOFRAN) 4 MG tablet TAKE 1 TABLET (4 MG TOTAL) BY MOUTH 3 (THREE) TIMES DAILY. 20 tablet 0  . phenazopyridine (PYRIDIUM) 100 MG tablet Take 1 tablet (100 mg total) by mouth 3 (three) times daily as needed for pain. (Patient not taking: Reported on 10/20/2019) 21 tablet 0  . Plecanatide (TRULANCE) 3 MG TABS Take 3 mg by mouth daily. 30 tablet 2  . polyethylene glycol (MIRALAX) 17 g packet Take 17 g by mouth daily. (Patient taking differently: Take 17 g by mouth daily as needed for mild constipation. ) 14 each 0  . predniSONE (DELTASONE)  5 MG tablet Take 1 tablet (5 mg total) by mouth daily with breakfast. 90 tablet 3  . Pseudoephedrine HCl (SUDAFED) 30 MG/5ML LIQD Take 30 mg by mouth 4 (four) times daily as needed for congestion.    . senna (SENOKOT) 8.6 MG TABS tablet Take 1 tablet by mouth daily as needed for mild constipation.    . sulfamethoxazole-trimethoprim (BACTRIM DS) 800-160 MG tablet Take 1 tablet by mouth 2 (two) times daily. (Patient not taking: Reported on 09/24/2019) 14 tablet 0  . tamsulosin (FLOMAX) 0.4 MG CAPS capsule Take 1 capsule (0.4 mg total) by mouth daily after supper. 30 capsule 3   No current facility-administered medications for this visit.     Allergies:  Allergies   Allergen Reactions  . Ibuprofen Anaphylaxis  . Shrimp [Shellfish Allergy] Anaphylaxis       Physical Exam:    Blood pressure (!) 196/83, pulse (!) 57, temperature (!) 96.4 F (35.8 C), temperature source Tympanic, resp. rate 18, height 5\' 7"  (1.702 m), weight 214 lb 12.8 oz (97.4 kg), SpO2 100 %.      ECOG: 1    General appearance: Alert, awake without any distress. Head: Atraumatic without abnormalities Oropharynx: Without any thrush or ulcers. Eyes: No scleral icterus. Lymph nodes: No lymphadenopathy noted in the cervical, supraclavicular, or axillary nodes Heart:regular rate and rhythm, without any murmurs or gallops.   Lung: Clear to auscultation without any rhonchi, wheezes or dullness to percussion. Abdomin: Soft, nontender without any shifting dullness or ascites. Musculoskeletal: No clubbing or cyanosis. Neurological: No motor or sensory deficits. Skin: No rashes or lesions.           Lab Results: Lab Results  Component Value Date   WBC 5.4 10/31/2019   HGB 12.3 (L) 10/31/2019   HCT 37.9 (L) 10/31/2019   MCV 94.3 10/31/2019   PLT 271 10/31/2019     Chemistry      Component Value Date/Time   NA 142 10/31/2019 1139   K 3.6 10/31/2019 1139   CL 103 10/31/2019 1139   CO2 32 10/31/2019 1139   BUN 15 10/31/2019 1139   CREATININE 0.82 10/31/2019 1139   CREATININE 1.05 09/19/2019 1227      Component Value Date/Time   CALCIUM 9.2 10/31/2019 1139   ALKPHOS 64 09/24/2019 2037   AST 16 09/24/2019 2037   AST 12 (L) 09/19/2019 1227   ALT 13 09/24/2019 2037   ALT 9 09/19/2019 1227   BILITOT 0.6 09/24/2019 2037   BILITOT 0.3 09/19/2019 1227      Results for Kops, Angel R (MRN [id number]) as of 11/29/2019 12:57  Ref. Range 07/28/2019 12:51 08/21/2019 14:42 09/19/2019 12:27  Prostate Specific Ag, Serum Latest Ref Range: 0.0 - 4.0 ng/mL 49.6 (H) 13.3 (H) 5.1 (H)     Impression and Plan:   69 year old man with:  1.    Castration-sensitive  advanced prostate cancer with disease to the bone and lymphadenopathy diagnosed in December 2020.   The natural course of his disease was reviewed and treatment options were reiterated.  Continues to tolerate Zytiga without any major complications and excellent PSA response.  Risks and benefits of continuing Zytiga long-term were reviewed.  These would include hypertension, edema and fatigue.  Alternative options such as systemic chemotherapy, Trudi Ida among others were reviewed.   He is agreeable to continue at this time.   2.  Androgen deprivation therapy: He will receive Eligard today and repeated in 4 months.  Long-term complications including hot flashes and  weight gain were reiterated.  3.  Bone directed therapy: I recommended calcium and vitamin D supplements.  Delton See will be deferred till he obtains dental clearance.  4.  Bone pain: Currently on morphine and breakthrough Dilaudid.  His pain is related to advanced prostate cancer.  This regimen is effective and I recommended continuing it for the time being.    5.    Functional mobility: Stable at this time without any further decline.  6.  Constipation: Manageable with periodic episodes of diarrhea.  7.  Urinary retention: He is status post TURP procedure on November 08, 2019 under care of Dr. [name].  His urination status is improved.  8.  Covid vaccination considerations: I recommended 8 Covid vaccine booster given his ongoing cancer and cancer treatment.  He is agreeable to proceed.  8.  Follow-up: In 4 months for repeat evaluation.    30  minutes were dedicated to this visit.  The time was spent on reviewing his disease status, discussing treatment options and addressing complications related to his cancer and cancer therapy.   Zola Button, MD 8/25/202112:58 PM

## 2019-11-29 NOTE — Patient Instructions (Signed)

## 2019-11-30 DIAGNOSIS — C61 Malignant neoplasm of prostate: Secondary | ICD-10-CM | POA: Diagnosis not present

## 2019-11-30 LAB — PROSTATE-SPECIFIC AG, SERUM (LABCORP): Prostate Specific Ag, Serum: 10.3 ng/mL — ABNORMAL HIGH (ref 0.0–4.0)

## 2019-12-01 DIAGNOSIS — C61 Malignant neoplasm of prostate: Secondary | ICD-10-CM | POA: Diagnosis not present

## 2019-12-02 DIAGNOSIS — C61 Malignant neoplasm of prostate: Secondary | ICD-10-CM | POA: Diagnosis not present

## 2019-12-04 DIAGNOSIS — C61 Malignant neoplasm of prostate: Secondary | ICD-10-CM | POA: Diagnosis not present

## 2019-12-05 ENCOUNTER — Telehealth: Payer: Self-pay | Admitting: Oncology

## 2019-12-05 NOTE — Telephone Encounter (Signed)
Scheduled per 08/25 los, patient has been called and voicemail was left.

## 2019-12-06 ENCOUNTER — Other Ambulatory Visit: Payer: Self-pay | Admitting: Oncology

## 2019-12-06 ENCOUNTER — Inpatient Hospital Stay: Payer: Medicare Other | Attending: Oncology

## 2019-12-06 ENCOUNTER — Other Ambulatory Visit: Payer: Self-pay

## 2019-12-06 DIAGNOSIS — C61 Malignant neoplasm of prostate: Secondary | ICD-10-CM | POA: Diagnosis not present

## 2019-12-06 DIAGNOSIS — Z23 Encounter for immunization: Secondary | ICD-10-CM

## 2019-12-06 MED ORDER — MORPHINE SULFATE ER 30 MG PO TBCR: 30.0000 mg | EXTENDED_RELEASE_TABLET | Freq: Two times a day (BID) | ORAL | 0 refills | Status: DC

## 2019-12-06 MED ORDER — HYDROMORPHONE HCL 4 MG PO TABS: 4.0000 mg | ORAL_TABLET | ORAL | 0 refills | Status: DC | PRN

## 2019-12-06 NOTE — Progress Notes (Signed)
   Covid-19 Vaccination Clinic  Name:  Andre Glass    MRN: [id number] DOB: 10-May-1950  12/06/2019  [name] was observed post Covid-19 immunization for 15 minutes without incident. He was provided with Vaccine Information Sheet and instruction to access the V-Safe system.   [name] was instructed to call 911 with any severe reactions post vaccine: Marland Kitchen Difficulty breathing  . Swelling of face and throat  . A fast heartbeat  . A bad rash all over body  . Dizziness and weakness   Immunizations Administered    Name Date Dose VIS Date Route   Pfizer COVID-19 Vaccine 12/06/2019  9:04 AM 0.3 mL 05/31/2018 Intramuscular   Manufacturer: [place]   Lot: [id number]   [place]: [id number]

## 2019-12-07 DIAGNOSIS — C61 Malignant neoplasm of prostate: Secondary | ICD-10-CM | POA: Diagnosis not present

## 2019-12-08 DIAGNOSIS — C61 Malignant neoplasm of prostate: Secondary | ICD-10-CM | POA: Diagnosis not present

## 2019-12-09 DIAGNOSIS — C61 Malignant neoplasm of prostate: Secondary | ICD-10-CM | POA: Diagnosis not present

## 2019-12-10 DIAGNOSIS — C61 Malignant neoplasm of prostate: Secondary | ICD-10-CM | POA: Diagnosis not present

## 2019-12-12 DIAGNOSIS — C61 Malignant neoplasm of prostate: Secondary | ICD-10-CM | POA: Diagnosis not present

## 2019-12-13 ENCOUNTER — Other Ambulatory Visit: Payer: Self-pay | Admitting: Urology

## 2019-12-13 DIAGNOSIS — C61 Malignant neoplasm of prostate: Secondary | ICD-10-CM | POA: Diagnosis not present

## 2019-12-14 DIAGNOSIS — C61 Malignant neoplasm of prostate: Secondary | ICD-10-CM | POA: Diagnosis not present

## 2019-12-15 DIAGNOSIS — C61 Malignant neoplasm of prostate: Secondary | ICD-10-CM | POA: Diagnosis not present

## 2019-12-17 DIAGNOSIS — C61 Malignant neoplasm of prostate: Secondary | ICD-10-CM | POA: Diagnosis not present

## 2019-12-18 DIAGNOSIS — C61 Malignant neoplasm of prostate: Secondary | ICD-10-CM | POA: Diagnosis not present

## 2019-12-19 DIAGNOSIS — C61 Malignant neoplasm of prostate: Secondary | ICD-10-CM | POA: Diagnosis not present

## 2019-12-20 DIAGNOSIS — C61 Malignant neoplasm of prostate: Secondary | ICD-10-CM | POA: Diagnosis not present

## 2019-12-21 DIAGNOSIS — C61 Malignant neoplasm of prostate: Secondary | ICD-10-CM | POA: Diagnosis not present

## 2019-12-22 ENCOUNTER — Other Ambulatory Visit: Payer: Self-pay | Admitting: Oncology

## 2019-12-22 DIAGNOSIS — C61 Malignant neoplasm of prostate: Secondary | ICD-10-CM | POA: Diagnosis not present

## 2019-12-23 DIAGNOSIS — C61 Malignant neoplasm of prostate: Secondary | ICD-10-CM | POA: Diagnosis not present

## 2019-12-24 DIAGNOSIS — C61 Malignant neoplasm of prostate: Secondary | ICD-10-CM | POA: Diagnosis not present

## 2019-12-25 ENCOUNTER — Other Ambulatory Visit: Payer: Self-pay | Admitting: *Deleted

## 2019-12-25 DIAGNOSIS — C61 Malignant neoplasm of prostate: Secondary | ICD-10-CM | POA: Diagnosis not present

## 2019-12-25 MED ORDER — ABIRATERONE ACETATE 250 MG PO TABS
1000.0000 mg | ORAL_TABLET | Freq: Every day | ORAL | 0 refills | Status: DC
Start: 2019-12-25 — End: 2020-09-29

## 2019-12-25 MED FILL — ABIRATERONE ACETATE 250 MG: 250 | 30 days supply | Qty: 120 | Fill #0

## 2019-12-25 MED FILL — ABIRATERONE ACETATE 250 MG: 250 | 30 days supply | Qty: 120 | Fill #0 | Status: TO

## 2019-12-26 DIAGNOSIS — C61 Malignant neoplasm of prostate: Secondary | ICD-10-CM | POA: Diagnosis not present

## 2019-12-29 DIAGNOSIS — C61 Malignant neoplasm of prostate: Secondary | ICD-10-CM | POA: Diagnosis not present

## 2019-12-30 DIAGNOSIS — C61 Malignant neoplasm of prostate: Secondary | ICD-10-CM | POA: Diagnosis not present

## 2019-12-31 DIAGNOSIS — C61 Malignant neoplasm of prostate: Secondary | ICD-10-CM | POA: Diagnosis not present

## 2020-01-01 DIAGNOSIS — C61 Malignant neoplasm of prostate: Secondary | ICD-10-CM | POA: Diagnosis not present

## 2020-01-02 DIAGNOSIS — C61 Malignant neoplasm of prostate: Secondary | ICD-10-CM | POA: Diagnosis not present

## 2020-01-04 DIAGNOSIS — C61 Malignant neoplasm of prostate: Secondary | ICD-10-CM | POA: Diagnosis not present

## 2020-01-08 DIAGNOSIS — C61 Malignant neoplasm of prostate: Secondary | ICD-10-CM | POA: Diagnosis not present

## 2020-01-10 DIAGNOSIS — C61 Malignant neoplasm of prostate: Secondary | ICD-10-CM | POA: Diagnosis not present

## 2020-01-11 ENCOUNTER — Telehealth: Payer: Self-pay | Admitting: Oncology

## 2020-01-11 ENCOUNTER — Telehealth: Payer: Self-pay

## 2020-01-11 DIAGNOSIS — C61 Malignant neoplasm of prostate: Secondary | ICD-10-CM | POA: Diagnosis not present

## 2020-01-11 NOTE — Telephone Encounter (Signed)
-----   Message from [name], MD sent at 01/11/2020 12:05 PM EDT ----- Regarding: RE: pain I will see him on 10/8. Please send a message to scheduling for lab + MD (9:30). Thanks ----- Message ----- From: Scot Dock, RN Sent: 01/11/2020  12:00 PM EDT To: [name], MD Subject: pain                                           Stated his bilateral leg pain has increased since the weekend with left leg pain greater than right. He is taking the morphine Q12 and the dilauded Q 4 and is still rating his left left pain at a 7-10. He is also using a walker instead of cane for more support and stability because the leg pain increases with any weight bearing. Next visit 12/22. Please advise.

## 2020-01-11 NOTE — Telephone Encounter (Signed)
Contacted patient and made sure he was aware of tomorrow appointments. Patient verbalized understanding.

## 2020-01-11 NOTE — Telephone Encounter (Signed)
Scheduled appt per 10/7 sch msg - pt is aware of appt.

## 2020-01-12 ENCOUNTER — Inpatient Hospital Stay: Payer: Medicare Other | Attending: Oncology | Admitting: Oncology

## 2020-01-12 ENCOUNTER — Encounter (HOSPITAL_COMMUNITY): Payer: Self-pay

## 2020-01-12 ENCOUNTER — Other Ambulatory Visit: Payer: Self-pay

## 2020-01-12 ENCOUNTER — Inpatient Hospital Stay: Payer: Medicare Other

## 2020-01-12 ENCOUNTER — Emergency Department (HOSPITAL_COMMUNITY): Payer: Medicare Other

## 2020-01-12 ENCOUNTER — Emergency Department (HOSPITAL_COMMUNITY)
Admission: EM | Admit: 2020-01-12 | Discharge: 2020-01-12 | Disposition: A | Discharge status: Home / Self Care | Payer: Medicare Other | Attending: Emergency Medicine | Admitting: Emergency Medicine

## 2020-01-12 VITALS — BP 169/96 | HR 78 | Temp 97.2°F | Resp 16 | Ht 67.0 in

## 2020-01-12 DIAGNOSIS — R531 Weakness: Secondary | ICD-10-CM | POA: Insufficient documentation

## 2020-01-12 DIAGNOSIS — Z20822 Contact with and (suspected) exposure to covid-19: Secondary | ICD-10-CM | POA: Insufficient documentation

## 2020-01-12 DIAGNOSIS — M549 Dorsalgia, unspecified: Secondary | ICD-10-CM | POA: Insufficient documentation

## 2020-01-12 DIAGNOSIS — C7651 Malignant neoplasm of right lower limb: Secondary | ICD-10-CM | POA: Insufficient documentation

## 2020-01-12 DIAGNOSIS — Z79899 Other long term (current) drug therapy: Secondary | ICD-10-CM | POA: Diagnosis not present

## 2020-01-12 DIAGNOSIS — I700 Atherosclerosis of aorta: Secondary | ICD-10-CM | POA: Diagnosis not present

## 2020-01-12 DIAGNOSIS — M5459 Other low back pain: Secondary | ICD-10-CM | POA: Insufficient documentation

## 2020-01-12 DIAGNOSIS — C61 Malignant neoplasm of prostate: Secondary | ICD-10-CM

## 2020-01-12 DIAGNOSIS — C7951 Secondary malignant neoplasm of bone: Secondary | ICD-10-CM | POA: Diagnosis not present

## 2020-01-12 DIAGNOSIS — Z5189 Encounter for other specified aftercare: Secondary | ICD-10-CM | POA: Insufficient documentation

## 2020-01-12 DIAGNOSIS — Z79818 Long term (current) use of other agents affecting estrogen receptors and estrogen levels: Secondary | ICD-10-CM | POA: Insufficient documentation

## 2020-01-12 DIAGNOSIS — N1330 Unspecified hydronephrosis: Secondary | ICD-10-CM | POA: Diagnosis not present

## 2020-01-12 DIAGNOSIS — M545 Low back pain, unspecified: Secondary | ICD-10-CM | POA: Diagnosis not present

## 2020-01-12 DIAGNOSIS — Z23 Encounter for immunization: Secondary | ICD-10-CM | POA: Insufficient documentation

## 2020-01-12 DIAGNOSIS — Z5111 Encounter for antineoplastic chemotherapy: Secondary | ICD-10-CM | POA: Insufficient documentation

## 2020-01-12 DIAGNOSIS — Z923 Personal history of irradiation: Secondary | ICD-10-CM | POA: Insufficient documentation

## 2020-01-12 DIAGNOSIS — N400 Enlarged prostate without lower urinary tract symptoms: Secondary | ICD-10-CM | POA: Diagnosis not present

## 2020-01-12 LAB — CMP (CANCER CENTER ONLY)
ALT: 14 U/L (ref 0–44)
AST: 14 U/L — ABNORMAL LOW (ref 15–41)
Albumin: 3.1 g/dL — ABNORMAL LOW (ref 3.5–5.0)
Alkaline Phosphatase: 89 U/L (ref 38–126)
Anion gap: 6 (ref 5–15)
BUN: 11 mg/dL (ref 8–23)
CO2: 33 mmol/L — ABNORMAL HIGH (ref 22–32)
Calcium: 10 mg/dL (ref 8.9–10.3)
Chloride: 102 mmol/L (ref 98–111)
Creatinine: 1 mg/dL (ref 0.61–1.24)
GFR, Estimated: >60 mL/min (ref >60)
Glucose, Bld: 119 mg/dL — ABNORMAL HIGH (ref 70–99)
Potassium: 3.7 mmol/L (ref 3.5–5.1)
Sodium: 141 mmol/L (ref 135–145)
Total Bilirubin: 0.4 mg/dL (ref 0.3–1.2)
Total Protein: 8.7 g/dL — ABNORMAL HIGH (ref 6.5–8.1)

## 2020-01-12 LAB — CBC WITH DIFFERENTIAL (CANCER CENTER ONLY)
Abs Immature Granulocytes: 0.05 10*3/uL (ref 0.00–0.07)
Basophils Absolute: 0 10*3/uL (ref 0.0–0.1)
Basophils Relative: 1 %
Eosinophils Absolute: 0.1 10*3/uL (ref 0.0–0.5)
Eosinophils Relative: 2 %
HCT: 35.9 % — ABNORMAL LOW (ref 39.0–52.0)
Hemoglobin: 11.9 g/dL — ABNORMAL LOW (ref 13.0–17.0)
Immature Granulocytes: 1 %
Lymphocytes Relative: 21 %
Lymphs Abs: 1.3 10*3/uL (ref 0.7–4.0)
MCH: 30.4 pg (ref 26.0–34.0)
MCHC: 33.1 g/dL (ref 30.0–36.0)
MCV: 91.8 fL (ref 80.0–100.0)
Monocytes Absolute: 0.4 10*3/uL (ref 0.1–1.0)
Monocytes Relative: 7 %
Neutro Abs: 4.2 10*3/uL (ref 1.7–7.7)
Neutrophils Relative %: 68 %
Platelet Count: 314 10*3/uL (ref 150–400)
RBC: 3.91 MIL/uL — ABNORMAL LOW (ref 4.22–5.81)
RDW: 11.8 % (ref 11.5–15.5)
WBC Count: 6.1 10*3/uL (ref 4.0–10.5)
nRBC: 0 % (ref 0.0–0.2)

## 2020-01-12 LAB — RESPIRATORY PANEL BY RT PCR (FLU A&B, COVID)
Influenza A by PCR: NEGATIVE
Influenza B by PCR: NEGATIVE
SARS Coronavirus 2 by RT PCR: NEGATIVE

## 2020-01-12 MED ORDER — HYDROMORPHONE HCL 1 MG/ML IJ SOLN
1.0000 mg | Freq: Once | INTRAMUSCULAR | Status: AC
  Administered 2020-01-12: 1 mg via INTRAVENOUS
  Filled 2020-01-12: qty 1

## 2020-01-12 MED ORDER — GADOBUTROL 1 MMOL/ML IV SOLN
9.5000 mL | Freq: Once | INTRAVENOUS | Status: AC | PRN
  Administered 2020-01-12: 9.5 mL via INTRAVENOUS

## 2020-01-12 MED ORDER — METHOCARBAMOL 1000 MG/10ML IJ SOLN
500.0000 mg | Freq: Once | INTRAVENOUS | Status: AC
  Administered 2020-01-12: 500 mg via INTRAVENOUS
  Filled 2020-01-12: qty 500

## 2020-01-12 MED ORDER — SODIUM CHLORIDE 0.9 % IV SOLN: INTRAVENOUS | Status: DC

## 2020-01-12 MED ORDER — HYDROMORPHONE HCL 2 MG/ML IJ SOLN
2.0000 mg | Freq: Once | INTRAMUSCULAR | Status: AC
  Administered 2020-01-12: 2 mg via INTRAVENOUS
  Filled 2020-01-12: qty 1

## 2020-01-12 MED ORDER — MORPHINE SULFATE (PF) 4 MG/ML IV SOLN
4.0000 mg | Freq: Once | INTRAVENOUS | Status: AC
  Administered 2020-01-12: 4 mg via INTRAVENOUS
  Filled 2020-01-12: qty 1

## 2020-01-12 NOTE — ED Notes (Signed)
Report called to Ophelia Charter, RN in ED

## 2020-01-12 NOTE — Progress Notes (Signed)
Hematology and Oncology Follow Up Visit  Andre Glass [id number] 18-Feb-1951 69 y.o. 01/12/2020 8:39 AM Andre Glass, DOWelborn, [name], DO   Principle Diagnosis: 69 year old man with advanced prostate cancer with disease to the bone diagnosed in December 2020.  He presented with Gleason score 4+5 = 9 and a PSA of 366 with castration-sensitive disease.  Prior Therapy: He is status post prostate biopsy completed on December 3 of 2020.  Pathology showed a Gleason score 4+5 = 9 with 12 out of 12 cores.  He is status post both of radiation therapy to the spine between T9 and L1.  He received 30 Gray in 10 fractions between January 15 and 28, 2021.   Firmagon 240 mg started on May 31, 2019.   Current therapy:   Eligard every 4 months starting in April 2021.  Next Eligard will be given in August 2021.  Zytiga 1000 mg daily with prednisone 5 mg daily started in May 2021.  Interim History: [name] presents today for return evaluation.  Since the last visit, he has reported increased pain in his lower back and extremities despite being on morphine and Dilaudid.  He has also reported decrease mobility and weakness in his lower extremities.  He has been using a cane previously the last 2 days he has not been able to do so.  He has not been able to ambulate without assistance.  He denies any loss of bowel and bladder function.    Medications: Reviewed without changes. Current Outpatient Medications  Medication Sig Dispense Refill  . abiraterone acetate (ZYTIGA) 250 MG tablet Take 4 tablets (1,000 mg total) by mouth daily. Take on an empty stomach 1 hour before or 2 hours after a meal 120 tablet 0  . acetaminophen (TYLENOL) 325 MG tablet Take 650 mg by mouth every 6 (six) hours as needed for mild pain.    . Artificial Saliva (BIOTENE ORALBALANCE DRY MOUTH) GEL Use as directed 1 Dose in the mouth or throat 4 (four) times daily -  before meals and at bedtime. (Patient taking differently: Use as  directed 1 Dose in the mouth or throat 4 (four) times daily as needed (dry mouth). ) 42 g 5  . docusate sodium (COLACE) 100 MG capsule Take 100 mg by mouth daily as needed for mild constipation or moderate constipation.    Marland Kitchen HYDROmorphone (DILAUDID) 4 MG tablet Take 1 tablet (4 mg total) by mouth every 4 (four) hours as needed for severe pain. 60 tablet 0  . mirabegron ER (MYRBETRIQ) 25 MG TB24 tablet Take 1 tablet (25 mg total) by mouth daily. 30 tablet 0  . morphine (MS CONTIN) 30 MG 12 hr tablet Take 1 tablet (30 mg total) by mouth every 12 (twelve) hours. 60 tablet 0  . ondansetron (ZOFRAN) 4 MG tablet TAKE 1 TABLET (4 MG TOTAL) BY MOUTH 3 (THREE) TIMES DAILY. 20 tablet 0  . phenazopyridine (PYRIDIUM) 100 MG tablet Take 1 tablet (100 mg total) by mouth 3 (three) times daily as needed for pain. (Patient not taking: Reported on 10/20/2019) 21 tablet 0  . Plecanatide (TRULANCE) 3 MG TABS Take 3 mg by mouth daily. 30 tablet 2  . polyethylene glycol (MIRALAX) 17 g packet Take 17 g by mouth daily. (Patient taking differently: Take 17 g by mouth daily as needed for mild constipation. ) 14 each 0  . predniSONE (DELTASONE) 5 MG tablet Take 1 tablet (5 mg total) by mouth daily with breakfast. 90 tablet 3  .  Pseudoephedrine HCl (SUDAFED) 30 MG/5ML LIQD Take 30 mg by mouth 4 (four) times daily as needed for congestion.    . senna (SENOKOT) 8.6 MG TABS tablet Take 1 tablet by mouth daily as needed for mild constipation.    . sulfamethoxazole-trimethoprim (BACTRIM DS) 800-160 MG tablet Take 1 tablet by mouth 2 (two) times daily. (Patient not taking: Reported on 09/24/2019) 14 tablet 0  . tamsulosin (FLOMAX) 0.4 MG CAPS capsule Take 1 capsule (0.4 mg total) by mouth daily after supper. 30 capsule 3   No current facility-administered medications for this visit.     Allergies:  Allergies  Allergen Reactions  . Ibuprofen Anaphylaxis  . Shrimp [Shellfish Allergy] Anaphylaxis       Physical Exam:       Blood pressure (!) 169/96, pulse 78, temperature (!) 97.2 F (36.2 C), temperature source Tympanic, resp. rate 16, height 5\' 7"  (1.702 m), SpO2 100 %.     ECOG: 1   General appearance:  Uncomfortable appearing gentleman in mild distress. Head: Normocephalic without any trauma Oropharynx: Mucous membranes are moist and pink without any thrush or ulcers. Eyes: Pupils are equal and round reactive to light. Lymph nodes: No cervical, supraclavicular, inguinal or axillary lymphadenopathy.   Heart:regular rate and rhythm.  S1 and S2 without leg edema. Lung: Clear without any rhonchi or wheezes.  No dullness to percussion. Abdomin: Soft, nontender, nondistended with good bowel sounds.  No hepatosplenomegaly. Musculoskeletal: No joint deformity or effusion.  Full range of motion noted. Neurological: Bilateral weakness noted in his lower extremities.  Difficult to assess neurologically given his severe pain. Skin: No petechial rash or dryness.  Appeared moist.             Lab Results: Lab Results  Component Value Date   WBC 5.6 11/29/2019   HGB 11.5 (L) 11/29/2019   HCT 35.2 (L) 11/29/2019   MCV 93.6 11/29/2019   PLT 290 11/29/2019     Chemistry      Component Value Date/Time   NA 142 11/29/2019 1306   K 3.5 11/29/2019 1306   CL 106 11/29/2019 1306   CO2 30 11/29/2019 1306   BUN 16 11/29/2019 1306   CREATININE 1.35 (H) 11/29/2019 1306      Component Value Date/Time   CALCIUM 10.1 11/29/2019 1306   ALKPHOS 77 11/29/2019 1306   AST 10 (L) 11/29/2019 1306   ALT <6 11/29/2019 1306   BILITOT 0.3 11/29/2019 1306      Results for Andre Glass (MRN [id number]) as of 01/12/2020 08:37  Ref. Range 07/28/2019 12:51 08/21/2019 14:42 09/19/2019 12:27 11/29/2019 13:06  Prostate Specific Ag, Serum Latest Ref Range: 0.0 - 4.0 ng/mL 49.6 (H) 13.3 (H) 5.1 (H) 10.3 (H)      Impression and Plan:   69 year old man with:  1.    Advanced prostate cancer with disease to the  bone and lymphadenopathy presented with castration-sensitive  in December 2020.   The natural course of his disease was reviewed and laboratory data were discussed.  His PSA in August 2021 did show a rise after gradual decline.  Increasing bone pain is worsening for worsening metastatic disease despite being on androgen deprivation therapy with Zytiga.  Treatment options moving forward were reviewed and alternative treatment options were discussed.  I recommended updating his staging work-up with the CT scan and bone scan and consideration to switch to systemic chemotherapy he has clear progression of disease.   We have to urgently evaluate his worsening back  pain for possible cord compression prior to completing his prostate cancer screening and switching treatment.      2.  Androgen deprivation therapy: Next Eligard will be given in December 2021.  Complications including weight gain, hot flashes among others were reviewed.  3.  Bone directed therapy: His calcium has been within normal range and I recommended continued supplements.  Delton See has been withheld till he obtains dental clearance.  I urged him to do that given the bone disease volume.  4.    Worsening back pain and weakness: This has occurred in the last 24 to 48 hours with decrease motor function of the lower extremities.  Neurological exam is difficult to assess due to pain.  I recommended urgent referral to the emergency department for MRI and possible neurosurgical evaluation if he has cord compression.    5.  Follow-up: Will be in the near future after completing his evaluation including MRI of the spine and staging work-up.    30  minutes were dedicated to this visit.  The time was spent on reviewing his disease status, discussing treatment options as well as managing his acute presentation.  Zola Button, MD 10/8/20218:39 AM

## 2020-01-12 NOTE — ED Provider Notes (Signed)
Pt transfer from [facility] ER for MRI of lumbar spine due to severe back pain, inability to ambulate and hx of prostate cancer.  Anticipate admission.    6:22 PM Lumbar spine MRI demonstrate stable appearance of multifocal osseous metastatic disease.  Multiple foraminal narrowing which is unchanged from prior.  No evidence of cauda equina.  Incidentally there is a moderate right-sided hydronephrosis with right ureter that is obstructed.  Since patient has back pain, will obtain CT renal stone study to rule out kidney stone as a cause of his back pain.  Patient was able to ambulate using a walker to the bathroom.  His pain is more so on the left side of his lower back with radicular pain down his left leg.  No significant CVA tenderness on exam.  Pain is still 9 out of 10 despite receiving multiple dose of pain medication.  9:14 PM CT scan of the abdomen reveals mild bilateral hydronephrosis right greater than left which is likely related to degree of obstruction at the level of the bladder due to UVJ occlusion secondary to tumor invasion.  Nodular soft tissue protruding to the base of the bladder were noted.  Likely would benefit from cystoscopy to assess for potential primary bladder neoplasm.  Interval progressions of osseous metastatic disease.  At this time, patient's pain has improved.  He is able to ambulate using a walker.  He does have morphine at home for pain control.  He does have a urologist and oncologist according to patient.  He understand that he can follow-up closely for further managements of his pain and treatment of his cancer.  At this time he can be discharged home but strict return precaution was given.  Patient voiced understanding and agrees with plan.  Patient able to urinate therefore I doubt bladder outlet obstruction.  Critical care time due to multiple dose of IV pain medications administered.  CRITICAL CARE Performed by: Domenic Moras Total critical care time: 30  minutes Critical care time was exclusive of separately billable procedures and treating other patients. Critical care was necessary to treat or prevent imminent or life-threatening deterioration. Critical care was time spent personally by me on the following activities: development of treatment plan with patient and/or surrogate as well as nursing, discussions with consultants, evaluation of patient's response to treatment, examination of patient, obtaining history from patient or surrogate, ordering and performing treatments and interventions, ordering and review of laboratory studies, ordering and review of radiographic studies, pulse oximetry and re-evaluation of patient's condition.  BP (!) 178/81 (BP Location: Right Arm)   Pulse 71   Temp 98.5 F (36.9 C) (Oral)   Resp 15   Ht 5\' 7"  (1.702 m)   Wt 94.8 kg   SpO2 100%   BMI 32.73 kg/m   Results for orders placed or performed during the hospital encounter of 01/12/20  Respiratory Panel by RT PCR (Flu A&B, Covid) - Nasopharyngeal Swab   Specimen: Nasopharyngeal Swab  Result Value Ref Range   SARS Coronavirus 2 by RT PCR NEGATIVE NEGATIVE   Influenza A by PCR NEGATIVE NEGATIVE   Influenza B by PCR NEGATIVE NEGATIVE   MR Lumbar Spine W Wo Contrast (assess for abscess, cord compression)  Result Date: 01/12/2020 CLINICAL DATA:  Severe low back pain. Personal history of prostate cancer. EXAM: MRI LUMBAR SPINE WITHOUT AND WITH CONTRAST TECHNIQUE: Multiplanar and multiecho pulse sequences of the lumbar spine were obtained without and with intravenous contrast. CONTRAST:  9.67mL GADAVIST GADOBUTROL 1 MMOL/ML  IV SOLN COMPARISON:  MRI lumbar spine without and with contrast 05/29/2019 FINDINGS: Segmentation: 5 non rib-bearing lumbar type vertebral bodies are present. The lowest fully formed vertebral body is L5. Alignment: No significant listhesis is present. Straightening of normal lumbar lordosis is stable. Mild curvature of the spine is convex to  the right at L1. Vertebrae: A large heterogeneous metastasis is again noted at T12 with extension into the left pedicle. Post radiation changes are present to the level of L1. Left paramedian lesion at L1 is stable in size. Sclerotic lesions along the inferior endplate of L2 are stable. The largest posterior on right 17 mm. Lesions at L3 are stable the largest is posteriorly left 14 mm. Heterogeneous enhancing lesion is again noted superiorly on the right at L4. 5 mm lesions at L5 are stable to decreased in size. Sacral lesions are stable. Conus medullaris and cauda equina: Conus extends to the L1 level. Conus and cauda equina appear normal. Paraspinal and other soft tissues: Right ureter is obstructed moderate right-sided hydronephrosis is present. Limited imaging the abdomen is otherwise unremarkable. Disc levels: L1-2: Negative. L2-3: Leftward disc protrusion is again noted. Moderate left foraminal stenosis is stable. L3-4: A broad-based disc protrusion is again noted, asymmetric to the left. Moderate foraminal narrowing is worse left than right, stable. L4-5: A broad-based disc protrusion is asymmetric to the right. Moderate foraminal narrowing is stable, right greater than left. L5-S1: A broad-based disc protrusion is present. Foraminal narrowing is present bilaterally. No significant change is present. IMPRESSION: 1. Stable appearance of multifocal osseous metastatic disease as described. 2. Post radiation changes in the thoracic spine to L1. 3. Multilevel disc disease leads to foraminal narrowing as described. 4. Moderate left foraminal stenosis at L2-3. 5. Moderate foraminal narrowing bilaterally at L3-4 is worse left than right. 6. Moderate foraminal narrowing bilaterally at L4-5 is stable, right greater than left. 7. Foraminal narrowing bilaterally at L5-S1. Electronically Signed   By: San Morelle M.D.   On: 01/12/2020 18:06   CT Renal Stone Study  Result Date: 01/12/2020 CLINICAL DATA:   69 year old male with flank pain. Concern for kidney stone. History of metastatic prostate cancer. EXAM: CT ABDOMEN AND PELVIS WITHOUT CONTRAST TECHNIQUE: Multidetector CT imaging of the abdomen and pelvis was performed following the standard protocol without IV contrast. COMPARISON:  CT abdomen pelvis dated 04/08/2019. FINDINGS: Lower chest: The visualized lung bases are clear. No intra-abdominal free air or free fluid. Hepatobiliary: No focal liver abnormality is seen. No gallstones, gallbladder wall thickening, or biliary dilatation. Pancreas: Unremarkable. No pancreatic ductal dilatation or surrounding inflammatory changes. Spleen: Normal in size without focal abnormality. Adrenals/Urinary Tract: The adrenal glands unremarkable. There is mild bilateral hydronephrosis. Retained excreted contrast within the renal collecting system and ureters. The urinary bladder is partially distended. Nodular soft tissue protruding into the base of the bladder may be extension of known prostate cancer. A primary bladder neoplasm is not excluded. Clinical correlation and further evaluation with cystoscopy is recommended. Stomach/Bowel: There is moderate stool throughout the colon. There is no bowel obstruction or active inflammation. The appendix is normal. Vascular/Lymphatic: Mild aortoiliac atherosclerotic disease. The IVC is unremarkable. No portal venous gas. There is a 15 mm rounded lymph nodes to the right of the rectum (82/3). Additional mildly rounded favor static/perirectal nodes noted. Multiple mildly enlarged retroperitoneal and para-aortic lymph nodes have decreased in size since the prior CT. Reproductive: The prostate gland appears smaller compared to the prior CT. Other: None Musculoskeletal: Multiple osseous metastatic disease  with more extensive involvement of T12 and T10. The T10 prostatic lesion appears larger compared to the prior CT. Bilateral ischial tuberosity metastatic disease new or increased since the  prior CT. Right iliac metastatic lesion, new since the prior CT. IMPRESSION: 1. Mild bilateral hydronephrosis, right greater left. Findings may be related to a degree of obstruction at the level of the bladder due to UVJ occlusion secondary to tumor invasion. 2. Nodular soft tissue protruding into the base of the bladder may be extension of known prostate cancer. A primary bladder neoplasm is not excluded. Clinical correlation and further evaluation with cystoscopy is recommended. 3. Interval decrease in the size of the retroperitoneal and para-aortic lymph nodes since the prior CT. 4. Right perirectal adenopathy. 5. Interval progression of osseous metastatic disease. 6. Aortic Atherosclerosis (ICD10-I70.0). Electronically Signed   By: Anner Crete M.D.   On: 01/12/2020 20:36      Domenic Moras, PA-C 01/12/20 2121    Lucrezia Starch, MD 01/13/20 [phone]

## 2020-01-12 NOTE — ED Notes (Signed)
[facility]ed [facility] for transport

## 2020-01-12 NOTE — ED Provider Notes (Addendum)
Pt seen here on arrival.  Pt sent here for MRi due to lower extremity weakness with a history of prostate cancer.  Pt has continued pain.   Vitals  Hypertensive at 200/87.  Lungs clear, Heart RRR  MRi pending.  Pt given dilaudid 1mg .  Pt report minimal relief.  He request morphine.     Fransico Meadow, PA-C Pt's care turned over to Domenic Moras PA-C  MRi pending 01/12/20 [place]    Sidney Ace 01/12/20 1531    Valarie Merino, MD 01/13/20 [phone]

## 2020-01-12 NOTE — ED Notes (Signed)
[facility] at bedside 

## 2020-01-12 NOTE — Discharge Instructions (Addendum)
Your back pain is likely related to your prostate cancer.  Fortunately no evidence of cord compression that would require emergent surgery.  Please continue taking your pain medication as previously prescribed.  You may increase your morphine as needed for pain control.  Follow-up closely with your urologist and your oncologist for further managements of your symptoms.  Your CT scan today did show evidence of tumor in your bladder, you may benefit from a cystoscopy for further evaluation.  Discussed this with your cancer doctor or your urologist.  Return promptly to the ER if your symptoms worsen, if you have trouble urinating, or if you have any other concern.

## 2020-01-12 NOTE — ED Provider Notes (Signed)
[facility] DEPT Provider Note   CSN: [id number] Arrival date & time: 01/12/20  0932     History Chief Complaint  Patient presents with  . Back Pain    Andre Glass is a 69 y.o. male.  69 year old male with history of metastatic prostate cancer presents with severe lower back pain along with worsening lower extremity weakness for the past 2 days.  No bowel or bladder dysfunction.  He denies any history of trauma.  Patient takes 4 mg of Dilaudid every 4 hours and this has not been helping.  Was seen at the cancer center today and concern for possible cauda equina and sent here for pain medication as well as MRI of L-spine.        Past Medical History:  Diagnosis Date  . Anemia   . History of kidney stones   . Prostate cancer ([place])   . UTI (urinary tract infection)     Patient Active Problem List   Diagnosis Date Noted  . BPH (benign prostatic hyperplasia) 11/08/2019  . Catheter-associated urinary tract infection ([place]) 09/14/2019  . Drug induced constipation 07/18/2019  . Establishing care with new doctor, encounter for 06/26/2019  . 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 Malignant neoplasm of prostate ([place]) 04/19/2019    Past Surgical History:  Procedure Laterality Date  . TRANSURETHRAL RESECTION OF PROSTATE N/A 11/08/2019   Procedure: TRANSURETHRAL RESECTION OF THE PROSTATE (TURP);  Surgeon: [name], MD;  Location: [facility] ORS;  Service: Urology;  Laterality: N/A;       Family History  Problem Relation Age of Onset  . Stomach cancer Mother   . Pancreatic cancer Sister   . Breast cancer Neg Hx   . Prostate cancer Neg Hx   . Colon cancer Neg Hx     Social History   Tobacco Use  . Smoking status: Never Smoker  . Smokeless tobacco: Never Used  Vaping Use  . Vaping Use: Never used  Substance Use Topics  . Alcohol use: Never  . Drug use: Never    Home Medications Prior to Admission medications   Medication Sig Start Date  End Date Taking? Authorizing Provider  abiraterone acetate (ZYTIGA) 250 MG tablet Take 4 tablets (1,000 mg total) by mouth daily. Take on an empty stomach 1 hour before or 2 hours after a meal 12/25/19   [name], MD  acetaminophen (TYLENOL) 325 MG tablet Take 650 mg by mouth every 6 (six) hours as needed for mild pain.    [provider]  Artificial Saliva (BIOTENE ORALBALANCE DRY MOUTH) GEL Use as directed 1 Dose in the mouth or throat 4 (four) times daily -  before meals and at bedtime. Patient taking differently: Use as directed 1 Dose in the mouth or throat 4 (four) times daily as needed (dry mouth).  04/28/19   Tyler Pita, MD  docusate sodium (COLACE) 100 MG capsule Take 100 mg by mouth daily as needed for mild constipation or moderate constipation.    [provider]  HYDROmorphone (DILAUDID) 4 MG tablet Take 1 tablet (4 mg total) by mouth every 4 (four) hours as needed for severe pain. 12/06/19   [name], MD  mirabegron ER (MYRBETRIQ) 25 MG TB24 tablet Take 1 tablet (25 mg total) by mouth daily. 10/27/19   [name], DO  morphine (MS CONTIN) 30 MG 12 hr tablet Take 1 tablet (30 mg total) by mouth every 12 (twelve) hours. 12/06/19   Shadad,  Mathis Dad, MD  ondansetron (ZOFRAN) 4 MG tablet TAKE 1 TABLET (4 MG TOTAL) BY MOUTH 3 (THREE) TIMES DAILY. 10/31/19   [name], MD  phenazopyridine (PYRIDIUM) 100 MG tablet Take 1 tablet (100 mg total) by mouth 3 (three) times daily as needed for pain. Patient not taking: Reported on 10/20/2019 08/21/19   Harle Stanford., PA-C  Plecanatide (TRULANCE) 3 MG TABS Take 3 mg by mouth daily. 10/27/19   [name], DO  polyethylene glycol (MIRALAX) 17 g packet Take 17 g by mouth daily. Patient taking differently: Take 17 g by mouth daily as needed for mild constipation.  06/18/19   Dorie Rank, MD  predniSONE (DELTASONE) 5 MG tablet Take 1 tablet (5 mg total) by mouth daily with breakfast. 10/25/19   [name], MD    Pseudoephedrine HCl (SUDAFED) 30 MG/5ML LIQD Take 30 mg by mouth 4 (four) times daily as needed for congestion.    [provider]  senna (SENOKOT) 8.6 MG TABS tablet Take 1 tablet by mouth daily as needed for mild constipation.    [provider]  sulfamethoxazole-trimethoprim (BACTRIM DS) 800-160 MG tablet Take 1 tablet by mouth 2 (two) times daily. Patient not taking: Reported on 09/24/2019 08/21/19   Harle Stanford., PA-C  tamsulosin (FLOMAX) 0.4 MG CAPS capsule Take 1 capsule (0.4 mg total) by mouth daily after supper. 10/27/19   [name], DO    Allergies    Ibuprofen and Shrimp [shellfish allergy]  Review of Systems   Review of Systems  All other systems reviewed and are negative.   Physical Exam Updated Vital Signs BP (!) 216/111 (BP Location: Right Arm)   Pulse 72   Temp (!) 97.5 F (36.4 C) (Oral)   Resp 18   Ht 1.702 m (5\' 7" )   Wt 94.8 kg   SpO2 99%   BMI 32.73 kg/m   Physical Exam Vitals and nursing note reviewed.  Constitutional:      General: He is not in acute distress.    Appearance: Normal appearance. He is well-developed. He is not toxic-appearing.  HENT:     Head: Normocephalic and atraumatic.  Eyes:     General: Lids are normal.     Conjunctiva/sclera: Conjunctivae normal.     Pupils: Pupils are equal, round, and reactive to light.  Neck:     Thyroid: No thyroid mass.     Trachea: No tracheal deviation.  Cardiovascular:     Rate and Rhythm: Normal rate and regular rhythm.     Heart sounds: Normal heart sounds. No murmur heard.  No gallop.   Pulmonary:     Effort: Pulmonary effort is normal. No respiratory distress.     Breath sounds: Normal breath sounds. No stridor. No decreased breath sounds, wheezing, rhonchi or rales.  Abdominal:     General: Bowel sounds are normal. There is no distension.     Palpations: Abdomen is soft.     Tenderness: There is no abdominal tenderness. There is no rebound.  Musculoskeletal:         General: No tenderness. Normal range of motion.     Cervical back: Normal range of motion and neck supple.  Skin:    General: Skin is warm and dry.     Findings: No abrasion or rash.  Neurological:     Mental Status: He is alert and oriented to person, place, and time.     GCS: GCS eye subscore is 4. GCS verbal subscore  is 5. GCS motor subscore is 6.     Cranial Nerves: No cranial nerve deficit.     Sensory: No sensory deficit.     Motor: Weakness present. No tremor.     Comments: Strength is three of five in bilateral lower extremities  Psychiatric:        Speech: Speech normal.        Behavior: Behavior normal.     ED Results / Procedures / Treatments   Labs (all labs ordered are listed, but only abnormal results are displayed) Labs Reviewed  RESPIRATORY PANEL BY RT PCR (FLU A&B, COVID)    EKG None  Radiology No results found.  Procedures Procedures (including critical care time)  Medications Ordered in ED Medications  0.9 %  sodium chloride infusion (has no administration in time range)  HYDROmorphone (DILAUDID) injection 2 mg (has no administration in time range)    ED Course  I have reviewed the triage vital signs and the nursing notes.  Pertinent labs & imaging results that were available during my care of the patient were reviewed by me and considered in my medical decision making (see chart for details).    MDM Rules/Calculators/A&P                          Will medicate here with IV Dilaudid as well as will give Robaxin.  We do not have MRI available here at this time.  I have discussed case with Dr. Ashok Cordia at [facility], ED and he is excepted the patient in transfer.  MRI has been ordered to be performed over there. Final Clinical Impression(s) / ED Diagnoses Final diagnoses:  Acute low back pain, unspecified back pain laterality, unspecified whether sciatica present    Rx / DC Orders ED Discharge Orders    None       Lacretia Leigh, MD 01/12/20  1021

## 2020-01-12 NOTE — ED Triage Notes (Signed)
Patient sent by cancer center for pain control. Currently being treated for advance prostate cancer and on morphine 30mg  bid and hydromorphone PRN. Patient complaining of lower back pain with radiation to bilateral legs.

## 2020-01-13 LAB — PROSTATE-SPECIFIC AG, SERUM (LABCORP): Prostate Specific Ag, Serum: 40.3 ng/mL — ABNORMAL HIGH (ref 0.0–4.0)

## 2020-01-14 DIAGNOSIS — C61 Malignant neoplasm of prostate: Secondary | ICD-10-CM | POA: Diagnosis not present

## 2020-01-15 ENCOUNTER — Other Ambulatory Visit: Payer: Self-pay | Admitting: Oncology

## 2020-01-15 ENCOUNTER — Telehealth: Payer: Self-pay

## 2020-01-15 DIAGNOSIS — C61 Malignant neoplasm of prostate: Secondary | ICD-10-CM

## 2020-01-15 MED ORDER — LIDOCAINE-PRILOCAINE 2.5-2.5 % EX CREA: 1.0000 "application " | TOPICAL_CREAM | CUTANEOUS | 0 refills | Status: DC | PRN

## 2020-01-15 MED ORDER — PROCHLORPERAZINE MALEATE 10 MG PO TABS: 10.0000 mg | ORAL_TABLET | Freq: Four times a day (QID) | ORAL | 0 refills | Status: DC | PRN

## 2020-01-15 NOTE — Telephone Encounter (Signed)
Spoke with patient about refilling pain meds. Patient states that he was taking his morphine 6 times a day and needs a refill. Sent a message to Dr [name] about refill. Advised patient I sent a message to Dr [name] for a refill.

## 2020-01-15 NOTE — Progress Notes (Signed)
Events noted from his emergency department visit on January 12, 2020.  Imaging studies including MRI of the lumbar spine as well as CT scan were completed.  No evidence of cord compression noted but progressive bone disease noted on his CT scan.  His PSA continues to rise rapidly currently at 40.3 which is up from 10.3.  These findings were discussed today with the patient via phone and treatment options going forward were reviewed.  These findings are concerning for rapid progression of his prostate cancer but could be contributing to his hydronephrosis as well as worsening bone pain.  Treatment options were reviewed and Taxotere chemotherapy remains the best option to palliate his disease.  Complication associated with this therapy that includes nausea, vomiting, myelosuppression, neutropenia and sepsis.  The benefit associated with Taxotere chemotherapy include reduction of his tumor burden and improvement in his pain levels.  After discussion today, he is agreeable to proceed.  We will arrange for chemo education class and a Port-A-Cath insertion in the near future.  He will discontinue Zytiga upon the start of chemotherapy.

## 2020-01-15 NOTE — Progress Notes (Signed)
START ON PATHWAY REGIMEN - Prostate     A cycle is every 21 days:     Docetaxel      Prednisone   **Always confirm dose/schedule in your pharmacy ordering system**  Patient Characteristics: Adenocarcinoma, Recurrent/New Systemic Disease, Castration Resistant, M1, Prior Novel Hormonal Agent, No Molecular Alteration or Targeted Therapy Exhausted, No Prior Docetaxel Histology: Adenocarcinoma Therapeutic Status: Recurrent/New Systemic Disease  Intent of Therapy: Non-Curative / Palliative Intent, Discussed with Patient 

## 2020-01-16 ENCOUNTER — Other Ambulatory Visit: Payer: Self-pay | Admitting: Oncology

## 2020-01-16 DIAGNOSIS — C61 Malignant neoplasm of prostate: Secondary | ICD-10-CM | POA: Diagnosis not present

## 2020-01-16 MED ORDER — MORPHINE SULFATE ER 30 MG PO TBCR
30.0000 mg | EXTENDED_RELEASE_TABLET | Freq: Three times a day (TID) | ORAL | 0 refills | Status: DC
Start: 2020-01-16 — End: 2020-02-14

## 2020-01-17 ENCOUNTER — Telehealth: Payer: Self-pay | Admitting: Oncology

## 2020-01-17 ENCOUNTER — Telehealth: Payer: Self-pay | Admitting: Radiation Oncology

## 2020-01-17 DIAGNOSIS — C61 Malignant neoplasm of prostate: Secondary | ICD-10-CM | POA: Diagnosis not present

## 2020-01-17 NOTE — Telephone Encounter (Signed)
Scheduled appt per 10/11 sch msg - pt is aware of appt on 10/15

## 2020-01-17 NOTE — Telephone Encounter (Signed)
Phoned [facility] pharmacy as requested by Freeman Caldron, PA-C. Left message on prescriber voicemail explaining that tamsulosin refill has been denies by PA, Bruning. Explained patient is no longer receiving radiation therapy thus refills of this medication need to come from his urologist. Provided my direct number for further questions reference this matter.

## 2020-01-18 ENCOUNTER — Observation Stay (HOSPITAL_COMMUNITY)
Admission: EM | Admit: 2020-01-18 | Discharge: 2020-01-19 | Disposition: A | Discharge status: Home / Self Care | Payer: Medicare Other | Attending: Internal Medicine | Admitting: Internal Medicine

## 2020-01-18 ENCOUNTER — Other Ambulatory Visit: Payer: Self-pay

## 2020-01-18 ENCOUNTER — Encounter (HOSPITAL_COMMUNITY): Payer: Self-pay | Admitting: Internal Medicine

## 2020-01-18 ENCOUNTER — Emergency Department (HOSPITAL_COMMUNITY): Payer: Medicare Other

## 2020-01-18 DIAGNOSIS — N1831 Chronic kidney disease, stage 3a: Secondary | ICD-10-CM | POA: Diagnosis not present

## 2020-01-18 DIAGNOSIS — Z79899 Other long term (current) drug therapy: Secondary | ICD-10-CM | POA: Diagnosis not present

## 2020-01-18 DIAGNOSIS — M545 Low back pain, unspecified: Secondary | ICD-10-CM | POA: Diagnosis not present

## 2020-01-18 DIAGNOSIS — Z515 Encounter for palliative care: Secondary | ICD-10-CM

## 2020-01-18 DIAGNOSIS — K5900 Constipation, unspecified: Secondary | ICD-10-CM | POA: Diagnosis not present

## 2020-01-18 DIAGNOSIS — C61 Malignant neoplasm of prostate: Secondary | ICD-10-CM | POA: Diagnosis not present

## 2020-01-18 DIAGNOSIS — M549 Dorsalgia, unspecified: Secondary | ICD-10-CM | POA: Diagnosis not present

## 2020-01-18 DIAGNOSIS — R8271 Bacteriuria: Secondary | ICD-10-CM | POA: Insufficient documentation

## 2020-01-18 DIAGNOSIS — Z7189 Other specified counseling: Secondary | ICD-10-CM

## 2020-01-18 DIAGNOSIS — M5489 Other dorsalgia: Secondary | ICD-10-CM | POA: Diagnosis not present

## 2020-01-18 DIAGNOSIS — N1330 Unspecified hydronephrosis: Secondary | ICD-10-CM | POA: Diagnosis not present

## 2020-01-18 DIAGNOSIS — R52 Pain, unspecified: Secondary | ICD-10-CM | POA: Diagnosis not present

## 2020-01-18 DIAGNOSIS — Z20822 Contact with and (suspected) exposure to covid-19: Secondary | ICD-10-CM | POA: Diagnosis not present

## 2020-01-18 DIAGNOSIS — R109 Unspecified abdominal pain: Secondary | ICD-10-CM | POA: Diagnosis not present

## 2020-01-18 DIAGNOSIS — R531 Weakness: Secondary | ICD-10-CM | POA: Diagnosis not present

## 2020-01-18 DIAGNOSIS — I10 Essential (primary) hypertension: Secondary | ICD-10-CM | POA: Diagnosis not present

## 2020-01-18 LAB — COMPREHENSIVE METABOLIC PANEL
ALT: 10 U/L (ref 0–44)
AST: 14 U/L — ABNORMAL LOW (ref 15–41)
Albumin: 3.2 g/dL — ABNORMAL LOW (ref 3.5–5.0)
Alkaline Phosphatase: 89 U/L (ref 38–126)
Anion gap: 10 (ref 5–15)
BUN: 20 mg/dL (ref 8–23)
CO2: 28 mmol/L (ref 22–32)
Calcium: 9.4 mg/dL (ref 8.9–10.3)
Chloride: 96 mmol/L — ABNORMAL LOW (ref 98–111)
Creatinine, Ser: 1.21 mg/dL (ref 0.61–1.24)
GFR, Estimated: >60 mL/min (ref >60)
Glucose, Bld: 142 mg/dL — ABNORMAL HIGH (ref 70–99)
Potassium: 3.8 mmol/L (ref 3.5–5.1)
Sodium: 134 mmol/L — ABNORMAL LOW (ref 135–145)
Total Bilirubin: 0.5 mg/dL (ref 0.3–1.2)
Total Protein: 8.7 g/dL — ABNORMAL HIGH (ref 6.5–8.1)

## 2020-01-18 LAB — RESP PANEL BY RT PCR (RSV, FLU A&B, COVID)
Influenza A by PCR: NEGATIVE
Influenza B by PCR: NEGATIVE
Respiratory Syncytial Virus by PCR: NEGATIVE
SARS Coronavirus 2 by RT PCR: NEGATIVE

## 2020-01-18 LAB — URINALYSIS, ROUTINE W REFLEX MICROSCOPIC
Bilirubin Urine: NEGATIVE
Glucose, UA: NEGATIVE mg/dL
Ketones, ur: NEGATIVE mg/dL
Nitrite: POSITIVE — AB
Protein, ur: 30 mg/dL — AB
Specific Gravity, Urine: 1.009 (ref 1.005–1.030)
WBC, UA: >50 WBC/hpf — ABNORMAL HIGH (ref 0–5)
pH: 6 (ref 5.0–8.0)

## 2020-01-18 LAB — CBC WITH DIFFERENTIAL/PLATELET
Abs Immature Granulocytes: 0.11 10*3/uL — ABNORMAL HIGH (ref 0.00–0.07)
Basophils Absolute: 0 10*3/uL (ref 0.0–0.1)
Basophils Relative: 0 %
Eosinophils Absolute: 0.1 10*3/uL (ref 0.0–0.5)
Eosinophils Relative: 1 %
HCT: 36.6 % — ABNORMAL LOW (ref 39.0–52.0)
Hemoglobin: 11.8 g/dL — ABNORMAL LOW (ref 13.0–17.0)
Immature Granulocytes: 1 %
Lymphocytes Relative: 17 %
Lymphs Abs: 1.5 10*3/uL (ref 0.7–4.0)
MCH: 30.1 pg (ref 26.0–34.0)
MCHC: 32.2 g/dL (ref 30.0–36.0)
MCV: 93.4 fL (ref 80.0–100.0)
Monocytes Absolute: 1 10*3/uL (ref 0.1–1.0)
Monocytes Relative: 11 %
Neutro Abs: 6.4 10*3/uL (ref 1.7–7.7)
Neutrophils Relative %: 70 %
Platelets: 476 10*3/uL — ABNORMAL HIGH (ref 150–400)
RBC: 3.92 MIL/uL — ABNORMAL LOW (ref 4.22–5.81)
RDW: 11.9 % (ref 11.5–15.5)
WBC: 9.2 10*3/uL (ref 4.0–10.5)
nRBC: 0 % (ref 0.0–0.2)

## 2020-01-18 MED ORDER — CEFTRIAXONE SODIUM 1 G IJ SOLR: 1.0000 g | Freq: Once | INTRAMUSCULAR | Status: DC

## 2020-01-18 MED ORDER — ENOXAPARIN SODIUM 40 MG/0.4ML ~~LOC~~ SOLN
40.0000 mg | SUBCUTANEOUS | Status: DC
  Administered 2020-01-18: 40 mg via SUBCUTANEOUS
  Filled 2020-01-18: qty 0.4

## 2020-01-18 MED ORDER — HYDROMORPHONE HCL 2 MG/ML IJ SOLN
2.0000 mg | INTRAMUSCULAR | Status: AC
  Administered 2020-01-18: 2 mg via INTRAVENOUS
  Filled 2020-01-18: qty 1

## 2020-01-18 MED ORDER — SODIUM CHLORIDE (PF) 0.9 % IJ SOLN
INTRAMUSCULAR | Status: AC
  Filled 2020-01-18: qty 50

## 2020-01-18 MED ORDER — POLYETHYLENE GLYCOL 3350 17 G PO PACK: 17.0000 g | PACK | Freq: Every day | ORAL | Status: DC | PRN

## 2020-01-18 MED ORDER — OXYBUTYNIN CHLORIDE 5 MG PO TABS: 5.0000 mg | ORAL_TABLET | Freq: Three times a day (TID) | ORAL | Status: DC | PRN

## 2020-01-18 MED ORDER — HYDRALAZINE HCL 20 MG/ML IJ SOLN: 10.0000 mg | Freq: Three times a day (TID) | INTRAMUSCULAR | Status: DC | PRN

## 2020-01-18 MED ORDER — SODIUM CHLORIDE 0.9 % IV SOLN
1.0000 g | INTRAVENOUS | Status: DC
  Administered 2020-01-18 – 2020-01-19 (×2): 1 g via INTRAVENOUS
  Filled 2020-01-18: qty 10
  Filled 2020-01-18: qty 0.97

## 2020-01-18 MED ORDER — DIPHENHYDRAMINE HCL 50 MG/ML IJ SOLN
12.5000 mg | Freq: Once | INTRAMUSCULAR | Status: AC
  Administered 2020-01-18: 12.5 mg via INTRAVENOUS
  Filled 2020-01-18: qty 1

## 2020-01-18 MED ORDER — PLECANATIDE 3 MG PO TABS: 3.0000 mg | ORAL_TABLET | Freq: Every day | ORAL | Status: DC | PRN

## 2020-01-18 MED ORDER — PREDNISONE 5 MG PO TABS
5.0000 mg | ORAL_TABLET | Freq: Every day | ORAL | Status: DC
  Administered 2020-01-19: 5 mg via ORAL
  Filled 2020-01-18: qty 1

## 2020-01-18 MED ORDER — MORPHINE SULFATE 30 MG PO TABS
30.0000 mg | ORAL_TABLET | Freq: Once | ORAL | Status: AC
  Administered 2020-01-18: 30 mg via ORAL
  Filled 2020-01-18: qty 1

## 2020-01-18 MED ORDER — HYDROMORPHONE HCL 1 MG/ML IJ SOLN
1.0000 mg | INTRAMUSCULAR | Status: DC | PRN
  Administered 2020-01-18 – 2020-01-19 (×2): 1 mg via INTRAVENOUS
  Filled 2020-01-18 (×2): qty 1

## 2020-01-18 MED ORDER — MORPHINE SULFATE ER 30 MG PO TBCR
30.0000 mg | EXTENDED_RELEASE_TABLET | Freq: Three times a day (TID) | ORAL | Status: DC
  Administered 2020-01-18 – 2020-01-19 (×4): 30 mg via ORAL
  Filled 2020-01-18 (×4): qty 1

## 2020-01-18 MED ORDER — DOCUSATE SODIUM 100 MG PO CAPS
100.0000 mg | ORAL_CAPSULE | Freq: Two times a day (BID) | ORAL | Status: DC
  Administered 2020-01-18 – 2020-01-19 (×2): 100 mg via ORAL
  Filled 2020-01-18 (×2): qty 1

## 2020-01-18 MED ORDER — IOHEXOL 300 MG/ML  SOLN
100.0000 mL | Freq: Once | INTRAMUSCULAR | Status: AC | PRN
  Administered 2020-01-18: 100 mL via INTRAVENOUS

## 2020-01-18 MED ORDER — TAMSULOSIN HCL 0.4 MG PO CAPS
0.4000 mg | ORAL_CAPSULE | Freq: Every day | ORAL | Status: DC
  Administered 2020-01-18: 0.4 mg via ORAL
  Filled 2020-01-18: qty 1

## 2020-01-18 MED ORDER — SENNA 8.6 MG PO TABS
1.0000 | ORAL_TABLET | Freq: Two times a day (BID) | ORAL | Status: DC
  Administered 2020-01-18 – 2020-01-19 (×2): 8.6 mg via ORAL
  Filled 2020-01-18 (×2): qty 1

## 2020-01-18 MED ORDER — ABIRATERONE ACETATE 250 MG PO TABS
1000.0000 mg | ORAL_TABLET | Freq: Every day | ORAL | Status: DC
  Administered 2020-01-18 – 2020-01-19 (×2): 1000 mg via ORAL

## 2020-01-18 MED ORDER — BISACODYL 10 MG RE SUPP: 10.0000 mg | Freq: Every day | RECTAL | Status: DC | PRN

## 2020-01-18 MED ORDER — ONDANSETRON HCL 4 MG/2ML IJ SOLN
4.0000 mg | INTRAMUSCULAR | Status: DC | PRN
  Administered 2020-01-19: 4 mg via INTRAVENOUS
  Filled 2020-01-18: qty 2

## 2020-01-18 NOTE — Plan of Care (Signed)
  Problem: Education: Goal: Knowledge of the prescribed therapeutic regimen will improve Outcome: Progressing   Problem: Activity: Goal: Ability to implement measures to reduce episodes of fatigue will improve Outcome: Progressing   Problem: Bowel/Gastric: Goal: Will not experience complications related to bowel motility Outcome: Progressing   Problem: Coping: Goal: Ability to identify and develop effective coping behavior will improve Outcome: Progressing   Problem: Nutritional: Goal: Maintenance of adequate nutrition will improve Outcome: Progressing   

## 2020-01-18 NOTE — ED Triage Notes (Signed)
Pt. Is alert and oriented x4 he arrived by EMS Complaints of pain in his back and legs.  Hx. Of prostate cancer. Went to PCP and found out the cancer has spread . Generalized pain x8 days. Pt. Was prescribed morphine and is now constipated.  Ems got vital bp 220/114- 98-18-97 RA 98.3 F

## 2020-01-18 NOTE — ED Provider Notes (Signed)
[facility] DEPT Provider Note   CSN: [id number] Arrival date & time: 01/18/20  5170     History Chief Complaint  Patient presents with  . Back Pain  . Flank Pain    Andre Glass is a 69 y.o. male who  has a past medical history of Anemia, History of kidney stones, Prostate cancer ([place]), and UTI (urinary tract infection). 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 He presents today with a c/o severe pain. He is currently taking 30 mg morphine BID recently increased to TID not started yet and Dilaudid 4mg  prn and he has been taking it up to 4 times daily. He has also not made a bm in 8 days. He rates his pain at 10/10 in the mid back radiating into both legs L>R . He is urinating without issue. He denies saddle anesthesia. He is able to ambulate. He denies vomiting. Denies fever, chills, or cough.  HPI     Past Medical History:  Diagnosis Date  . Anemia   . History of kidney stones   . Prostate cancer ([place])   . UTI (urinary tract infection)     Patient Active Problem List   Diagnosis Date Noted  . BPH (benign prostatic hyperplasia) 11/08/2019  . Catheter-associated urinary tract infection ([place]) 09/14/2019  . Drug induced constipation 07/18/2019  . Goals of care, counseling/discussion 06/26/2019  . Spine metastasis ([place]) 04/19/2019  . Malignant neoplasm of prostate ([place]) 04/19/2019    Past Surgical History:  Procedure Laterality Date  . TRANSURETHRAL RESECTION OF PROSTATE N/A 11/08/2019   Procedure: TRANSURETHRAL RESECTION OF THE PROSTATE (TURP);  Surgeon: [name], MD;  Location: [facility];  Service: Urology;  Laterality: N/A;       Family History  Problem Relation Age of Onset  . Stomach cancer Mother   . Pancreatic cancer Sister   . Breast cancer Neg Hx   . Prostate cancer Neg Hx   . Colon cancer Neg Hx     Social History   Tobacco Use  . Smoking  status: Never Smoker  . Smokeless tobacco: Never Used  Vaping Use  . Vaping Use: Never used  Substance Use Topics  . Alcohol use: Never  . Drug use: Never    Home Medications Prior to Admission medications   Medication Sig Start Date End Date Taking? Authorizing Provider  abiraterone acetate (ZYTIGA) 250 MG tablet Take 4 tablets (1,000 mg total) by mouth daily. Take on an empty stomach 1 hour before or 2 hours after a meal 12/25/19   [name], MD  acetaminophen (TYLENOL) 325 MG tablet Take 650 mg by mouth every 6 (six) hours as needed for mild pain.    [provider]  Artificial Saliva (BIOTENE ORALBALANCE DRY MOUTH) GEL Use as directed 1 Dose in the mouth or throat 4 (four) times daily -  before meals and at bedtime. Patient taking differently: Use as directed 1 Dose in the mouth or throat 4 (four) times daily as needed (dry mouth).  04/28/19   Tyler Pita, MD  Black Cohosh 80 MG CAPS Take 80 mg by mouth See admin instructions. Take 1 capsule by mouth at lunch, and additional capsule at bedtime if needed for hot flash    [provider]  docusate sodium (COLACE) 100 MG capsule Take 200 mg by mouth daily.     [provider]  HYDROmorphone (DILAUDID) 4 MG tablet Take  1 tablet (4 mg total) by mouth every 4 (four) hours as needed for severe pain. 12/06/19   [name], MD  lidocaine-prilocaine (EMLA) cream Apply 1 application topically as needed. 01/15/20   [name], MD  mirabegron ER (MYRBETRIQ) 25 MG TB24 tablet Take 1 tablet (25 mg total) by mouth daily. 10/27/19   [name], DO  morphine (MS CONTIN) 30 MG 12 hr tablet Take 1 tablet (30 mg total) by mouth every 8 (eight) hours. 01/16/20   [name], MD  Multiple Vitamin (MULTIVITAMIN WITH MINERALS) TABS tablet Take 1 tablet by mouth daily.    [provider]  ondansetron (ZOFRAN) 4 MG tablet TAKE 1 TABLET (4 MG TOTAL) BY MOUTH 3 (THREE) TIMES DAILY. 10/31/19   [name],  MD  oxybutynin (DITROPAN) 5 MG tablet Take 5 mg by mouth 3 (three) times daily as needed for bladder spasms. 10/28/19   [provider]  phenazopyridine (PYRIDIUM) 100 MG tablet Take 1 tablet (100 mg total) by mouth 3 (three) times daily as needed for pain. Patient not taking: Reported on 10/20/2019 08/21/19   Harle Stanford., PA-C  Plecanatide (TRULANCE) 3 MG TABS Take 3 mg by mouth daily. 10/27/19   [name], DO  polyethylene glycol (MIRALAX) 17 g packet Take 17 g by mouth daily. Patient taking differently: Take 17 g by mouth daily as needed for mild constipation.  06/18/19   Dorie Rank, MD  predniSONE (DELTASONE) 5 MG tablet Take 1 tablet (5 mg total) by mouth daily with breakfast. 10/25/19   [name], MD  prochlorperazine (COMPAZINE) 10 MG tablet Take 1 tablet (10 mg total) by mouth every 6 (six) hours as needed for nausea or vomiting. 01/15/20   [name], MD  Pseudoephedrine HCl, Deter, 30 MG TABA Take 30 mg by mouth 4 (four) times daily as needed for congestion.     [provider]  senna (SENOKOT) 8.6 MG TABS tablet Take 2 tablets by mouth daily.     [provider]  tamsulosin (FLOMAX) 0.4 MG CAPS capsule Take 1 capsule (0.4 mg total) by mouth daily after supper. 10/27/19   [name], DO    Allergies    Ibuprofen and Shrimp [shellfish allergy]  Review of Systems   Review of Systems Ten systems reviewed and are negative for acute change, except as noted in the HPI.   Physical Exam Updated Vital Signs There were no vitals taken for this visit.  Physical Exam Vitals and nursing note reviewed.  Constitutional:      General: He is not in acute distress.    Appearance: He is well-developed. He is ill-appearing. He is not toxic-appearing or diaphoretic.     Comments: Patient iappears extremely uncomfortable  HENT:     Head: Normocephalic and atraumatic.  Eyes:     General: No scleral icterus.    Conjunctiva/sclera: Conjunctivae normal.    Cardiovascular:     Rate and Rhythm: Normal rate and regular rhythm.     Heart sounds: Normal heart sounds.  Pulmonary:     Effort: Pulmonary effort is normal. No respiratory distress.     Breath sounds: Normal breath sounds.  Abdominal:     Palpations: Abdomen is soft.     Tenderness: There is no abdominal tenderness.  Musculoskeletal:     Cervical back: Normal range of motion and neck supple.  Skin:    General: Skin is warm and dry.  Neurological:  Mental Status: He is alert and oriented to person, place, and time.     Comments: Unable to fully assess strength due to severity of pain at this time.  Psychiatric:        Behavior: Behavior normal.      ED Results / Procedures / Treatments   Labs (all labs ordered are listed, but only abnormal results are displayed) Labs Reviewed - No data to display  EKG None  Radiology No results found.  Procedures Procedures (including critical care time)  Medications Ordered in ED Medications - No data to display  ED Course  I have reviewed the triage vital signs and the nursing notes.  Pertinent labs & imaging results that were available during my care of the patient were reviewed by me and considered in my medical decision making (see chart for details).  Clinical Course as of Jan 21 1349  Thu Jan 18, 2020  0925 Patient pain is currently a 4/10 as long as he is still but increaeses to a 7 out of 10 when trying to get adjusted in bed. Leg strength and reflexes are wnl.    [AH]    Clinical Course User Index [AH] Margarita Mail, PA-C   MDM Rules/Calculators/A&P                          Patient here with complaint of severe pain.  He has known metastatic adenocarcinoma of the prostate with metastases to the spine.  I ordered and reviewed labs which include CBC which shows stable normocytic anemia, CMP with slightly elevated glucose of insignificant value.  Urine appears infected.  I have sent urine for culture and place  the patient on Rocephin.  Respiratory panel is negative for Covid.  I discussed the case with Dr. [name] and we both agree that the patient should be admitted for pain control and palliative care consult.  Patient is stable here in the emergency department.  I reviewed the patient's CT abdomen and L-spine which show obstructive uropathy secondary to prostate and multiple metastatic changes of the lumbar spine. Final Clinical Impression(s) / ED Diagnoses Final diagnoses:  None    Rx / DC Orders ED Discharge Orders    None       Margarita Mail, PA-C 01/22/20 1352    Maudie Flakes, MD 01/23/20 0021

## 2020-01-18 NOTE — Progress Notes (Signed)
Events noted.  Reviewed patient renal radiograph prostate cancer currently receiving palliative therapy.  He has prostate cancer has progressed as evident by surprise and his PSA plan was to start Taxotere chemotherapy for palliative purposes.  He was hospitalized for increased bone pain and constipation.  He is currently receiving intravenous opioid medication and I appreciate the input from palliative medicine services to help with his pain.  I agree with the current plan with supportive management and tentatively will start chemotherapy upon his discharge once his pain is under better control.  We will continue to follow his progress.

## 2020-01-18 NOTE — H&P (Addendum)
History and Physical    Andre Glass YPP:[id number] DOB: 01-26-1951 DOA: 01/18/2020  PCP: [name], DO  Patient coming from: Home  Chief Complaint: back pain and constipation.   HPI: Andre Glass is a 69 y.o. male with medical history significant of prostate CA w/ mets, chronic pain. Presenting with back pain. He has this symptom chronically, but it has worsened over the last 8 days. He says it is located in the lower back, sharp, and radiates down both legs to his toes. It is worse on the left. He tried his normal pain regimen to help, but it did not. During that time he also notes constipation and increasing nausea. He saw his oncologist during this time and had an MRI performed. It showed stable appearance of multifocal osseous metastatic disease and multilevel disc disease w/ foraminal narrowing. No evidence of cord compression. He denies any new incontinence, numbness in the groin. As his pain has worsened, he decided it was time to come to the ED to be evaluated. He denies any other aggravating or alleviating factors. He denies any other treatments.    ED Course: Attempted pain control but was unsuccessful. [facility] was called for admission.   Review of Systems:  Denies CP, dyspnea, palpitations, syncopal episodes, incontinence. Reports nausea. Review of systems is otherwise negative for all not mentioned in HPI.   PMHx Past Medical History:  Diagnosis Date  . Anemia   . History of kidney stones   . Prostate cancer ([place])   . UTI (urinary tract infection)     PSHx Past Surgical History:  Procedure Laterality Date  . TRANSURETHRAL RESECTION OF PROSTATE N/A 11/08/2019   Procedure: TRANSURETHRAL RESECTION OF THE PROSTATE (TURP);  Surgeon: [name], MD;  Location: [facility];  Service: Urology;  Laterality: N/A;    SocHx  reports that he has never smoked. He has never used smokeless tobacco. He reports that he does not drink alcohol and does not use drugs.  Allergies  Allergen  Reactions  . Ibuprofen Anaphylaxis  . Shrimp [Shellfish Allergy] Anaphylaxis    FamHx Family History  Problem Relation Age of Onset  . Stomach cancer Mother   . Pancreatic cancer Sister   . Breast cancer Neg Hx   . Prostate cancer Neg Hx   . Colon cancer Neg Hx     Prior to Admission medications   Medication Sig Start Date End Date Taking? Authorizing Provider  abiraterone acetate (ZYTIGA) 250 MG tablet Take 4 tablets (1,000 mg total) by mouth daily. Take on an empty stomach 1 hour before or 2 hours after a meal 12/25/19   [name], MD  acetaminophen (TYLENOL) 325 MG tablet Take 650 mg by mouth every 6 (six) hours as needed for mild pain.    [provider]  Artificial Saliva (BIOTENE ORALBALANCE DRY MOUTH) GEL Use as directed 1 Dose in the mouth or throat 4 (four) times daily -  before meals and at bedtime. Patient taking differently: Use as directed 1 Dose in the mouth or throat 4 (four) times daily as needed (dry mouth).  04/28/19   Tyler Pita, MD  Black Cohosh 80 MG CAPS Take 80 mg by mouth See admin instructions. Take 1 capsule by mouth at lunch, and additional capsule at bedtime if needed for hot flash    [provider]  docusate sodium (COLACE) 100 MG capsule Take 200 mg by mouth daily.     [provider]  HYDROmorphone (DILAUDID)  4 MG tablet Take 1 tablet (4 mg total) by mouth every 4 (four) hours as needed for severe pain. 12/06/19   [name], MD  lidocaine-prilocaine (EMLA) cream Apply 1 application topically as needed. 01/15/20   [name], MD  mirabegron ER (MYRBETRIQ) 25 MG TB24 tablet Take 1 tablet (25 mg total) by mouth daily. 10/27/19   [name], DO  morphine (MS CONTIN) 30 MG 12 hr tablet Take 1 tablet (30 mg total) by mouth every 8 (eight) hours. 01/16/20   [name], MD  Multiple Vitamin (MULTIVITAMIN WITH MINERALS) TABS tablet Take 1 tablet by mouth daily.    [provider]  ondansetron (ZOFRAN) 4  MG tablet TAKE 1 TABLET (4 MG TOTAL) BY MOUTH 3 (THREE) TIMES DAILY. 10/31/19   [name], MD  oxybutynin (DITROPAN) 5 MG tablet Take 5 mg by mouth 3 (three) times daily as needed for bladder spasms. 10/28/19   [provider]  phenazopyridine (PYRIDIUM) 100 MG tablet Take 1 tablet (100 mg total) by mouth 3 (three) times daily as needed for pain. Patient not taking: Reported on 10/20/2019 08/21/19   Harle Stanford., PA-C  Plecanatide (TRULANCE) 3 MG TABS Take 3 mg by mouth daily. 10/27/19   [name], DO  polyethylene glycol (MIRALAX) 17 g packet Take 17 g by mouth daily. Patient taking differently: Take 17 g by mouth daily as needed for mild constipation.  06/18/19   Dorie Rank, MD  predniSONE (DELTASONE) 5 MG tablet Take 1 tablet (5 mg total) by mouth daily with breakfast. 10/25/19   [name], MD  prochlorperazine (COMPAZINE) 10 MG tablet Take 1 tablet (10 mg total) by mouth every 6 (six) hours as needed for nausea or vomiting. 01/15/20   [name], MD  Pseudoephedrine HCl, Deter, 30 MG TABA Take 30 mg by mouth 4 (four) times daily as needed for congestion.     [provider]  senna (SENOKOT) 8.6 MG TABS tablet Take 2 tablets by mouth daily.     [provider]  tamsulosin (FLOMAX) 0.4 MG CAPS capsule Take 1 capsule (0.4 mg total) by mouth daily after supper. 10/27/19   [name], DO    Physical Exam: Vitals:   01/18/20 0810 01/18/20 0814 01/18/20 0900 01/18/20 1006  BP:   (!) 190/91 (!) 170/89  Pulse:   85 97  Resp:   15 12  Temp: 98.2 F (36.8 C)     TempSrc: Oral     SpO2:   98% 99%  Weight:  94 kg    Height:  5\' 7"  (1.702 m)      General: 69 y.o. male resting in bed in NAD Eyes: PERRL, normal sclera ENMT: Nares patent w/o discharge, orophaynx clear, dentition normal, ears w/o discharge/lesions/ulcers Neck: Supple, trachea midline Cardiovascular: RRR, +S1, S2, no m/g/r, equal pulses throughout Respiratory: CTABL, no w/r/r, normal  WOB GI: BS+, NDNT, no masses noted, no organomegaly noted MSK: No e/c/c Skin: No rashes, bruises, ulcerations noted Neuro: A&O x 3, symmetrical BLE "weakness" is associated with pain with examination maneuvers  Psyc: Appropriate interaction and affect, calm/cooperative  Labs on Admission: I have personally reviewed following labs and imaging studies  CBC: Recent Labs  Lab 01/12/20 0852 01/18/20 0700  WBC 6.1 9.2  NEUTROABS 4.2 6.4  HGB 11.9* 11.8*  HCT 35.9* 36.6*  MCV 91.8 93.4  PLT 314 149*   Basic Metabolic Panel: Recent Labs  Lab 01/12/20 0852 01/18/20 0700  NA 141 134*  K 3.7 3.8  CL 102 96*  CO2 33* 28  GLUCOSE 119* 142*  BUN 11 20  CREATININE 1.00 1.21  CALCIUM 10.0 9.4   GFR: Estimated Creatinine Clearance: 63 mL/min (by C-G formula based on SCr of 1.21 mg/dL). Liver Function Tests: Recent Labs  Lab 01/12/20 0852 01/18/20 0700  AST 14* 14*  ALT 14 10  ALKPHOS 89 89  BILITOT 0.4 0.5  PROT 8.7* 8.7*  ALBUMIN 3.1* 3.2*   No results for input(s): LIPASE, AMYLASE in the last 168 hours. No results for input(s): AMMONIA in the last 168 hours. Coagulation Profile: No results for input(s): INR, PROTIME in the last 168 hours. Cardiac Enzymes: No results for input(s): CKTOTAL, CKMB, CKMBINDEX, TROPONINI in the last 168 hours. BNP (last 3 results) No results for input(s): PROBNP in the last 8760 hours. HbA1C: No results for input(s): HGBA1C in the last 72 hours. CBG: No results for input(s): GLUCAP in the last 168 hours. Lipid Profile: No results for input(s): CHOL, HDL, LDLCALC, TRIG, CHOLHDL, LDLDIRECT in the last 72 hours. Thyroid Function Tests: No results for input(s): TSH, T4TOTAL, FREET4, T3FREE, THYROIDAB in the last 72 hours. Anemia Panel: No results for input(s): VITAMINB12, FOLATE, FERRITIN, TIBC, IRON, RETICCTPCT in the last 72 hours. Urine analysis:    Component Value Date/Time   COLORURINE YELLOW 01/18/2020 0848   APPEARANCEUR CLOUDY  (A) 01/18/2020 0848   LABSPEC 1.009 01/18/2020 0848   PHURINE 6.0 01/18/2020 0848   GLUCOSEU NEGATIVE 01/18/2020 0848   HGBUR MODERATE (A) 01/18/2020 0848   BILIRUBINUR NEGATIVE 01/18/2020 0848   BILIRUBINUR negative 09/14/2019 1105   KETONESUR NEGATIVE 01/18/2020 0848   PROTEINUR 30 (A) 01/18/2020 0848   UROBILINOGEN 0.2 09/14/2019 1105   NITRITE POSITIVE (A) 01/18/2020 0848   LEUKOCYTESUR LARGE (A) 01/18/2020 0848    Radiological Exams on Admission: CT ABDOMEN PELVIS W CONTRAST  Result Date: 01/18/2020 CLINICAL DATA:  Left upper quadrant abdominal pain. History of metastatic prostate cancer. EXAM: CT ABDOMEN AND PELVIS WITH CONTRAST TECHNIQUE: Multidetector CT imaging of the abdomen and pelvis was performed using the standard protocol following bolus administration of intravenous contrast. CONTRAST:  169mL OMNIPAQUE IOHEXOL 300 MG/ML  SOLN COMPARISON:  January 12, 2020. FINDINGS: Lower chest: No acute abnormality. Hepatobiliary: No focal liver abnormality is seen. No gallstones, gallbladder wall thickening, or biliary dilatation. Pancreas: Unremarkable. No pancreatic ductal dilatation or surrounding inflammatory changes. Spleen: Normal in size without focal abnormality. Adrenals/Urinary Tract: Adrenal glands appear normal. Stable bilateral hydroureteronephrosis is noted, right greater than left, presumably secondary to obstruction at the level of the ureterovesical junction bilaterally from tumor, which may represent malignancy extending from the prostate gland, although primary bladder malignancy cannot be excluded. No renal or ureteral calculi are noted. Stomach/Bowel: Stomach is within normal limits. Appendix appears normal. No evidence of bowel wall thickening, distention, or inflammatory changes. Vascular/Lymphatic: Atherosclerosis of thoracic aorta is noted. Stable left periaortic adenopathy is noted measuring 22 x 11 mm. Smaller retroperitoneal lymph nodes are noted. 18 x 17 mm lymph node  is noted to the right of the rectum concerning for metastatic disease. Reproductive: As noted above, prostate gland is mildly enlarged and appears to be extending into the inferior posterior aspect of the urinary bladder consistent with the history of malignancy. Other: No abdominal wall hernia or abnormality. No abdominopelvic ascites. Musculoskeletal: Multiple sclerotic osseous metastases are again noted in the visualized spine and pelvis. IMPRESSION: 1. Stable bilateral hydroureteronephrosis is noted, right greater than left,  presumably secondary to obstruction at the level of the ureterovesical junction bilaterally from tumor, which may represent malignancy extending from the prostate gland, although primary bladder malignancy cannot be excluded. 2. Stable retroperitoneal adenopathy is noted concerning for metastatic disease. 3. Multiple sclerotic osseous metastases are again noted in the visualized spine and pelvis. 4. Aortic atherosclerosis. Aortic Atherosclerosis (ICD10-I70.0). Electronically Signed   By: Marijo Conception M.D.   On: 01/18/2020 09:42   CT L-SPINE NO CHARGE  Result Date: 01/18/2020 CLINICAL DATA:  Low back pain in a patient with a history of metastatic prostate cancer. No known injury. EXAM: CT LUMBAR SPINE WITHOUT CONTRAST TECHNIQUE: Multidetector CT imaging of the lumbar spine was performed without intravenous contrast administration. Multiplanar CT image reconstructions were also generated. COMPARISON:  CT abdomen and pelvis this same day and 01/12/2020. FINDINGS: Segmentation: Standard. Alignment: Maintained. Vertebrae: As on the 01/12/2020 exam, multiple sclerotic lesions are identified consistent with metastatic prostate cancer. Largest deposit is in T12 which is nearly completely replaced. There is no fracture or new lesion. Paraspinal and other soft tissues: See report of dedicated abdomen and pelvis CT today. Disc levels: T11-12: Mild facet degenerative disease. Tiny left  paracentral protrusion. No stenosis. T12-L1: Mild-to-moderate facet arthropathy is more notable on the left. Minimal disc bulge. No stenosis. L1-2: Negative. L2-3: Negative. L3-4: Mild facet degenerative disease and a shallow disc bulge. The central canal and left foramen are open. Mild right foraminal narrowing. L4-5: Mild facet degenerative disease and a shallow disc bulge. The central canal is open. Moderate foraminal narrowing appears worse on the right. L5-S1: Shallow disc bulge and endplate spur. Mild facet arthropathy. The central canal is open. Moderate bilateral foraminal narrowing appears worse on the left. IMPRESSION: No acute abnormality. Sclerotic lesions consistent with metastatic disease are unchanged since the patient's 01/12/2020 exam. Spondylosis is most notable at L4-5 and L5-S1 where it results in moderate to bilateral foraminal narrowing. Electronically Signed   By: [name] M.D.   On: 01/18/2020 09:42   Assessment/Plan Intractable Pain     - admit to obs, med-surg     - consult palliative care for pain mgmt assistance  Hx of prostate cancer w/ spinal mets     - continue outpt follow up with oncology     - CT results noted  UTI     - UCx pending     - started on rocephin in ED, continue  Constipation     - fluids, laxatives, follow  Normocytic anemia     - no evidence of bleed, follow     - iron studies  BLE weakness     - normally uses cane     - endorses being more off balance     - exam limited by pain     - let's improve the pain aspect first; then have PT eval  HTN     - no previous Hx     - ? Rxn to pain     - add PRN hydralazine  DVT prophylaxis: lovenox  Code Status: FULL  Family Communication: With family at bedside  Consults called: Palliative Care  Admission status: Observation  Status is: Observation  The patient remains OBS appropriate and will d/c before 2 midnights.  Dispo: The patient is from: Home              Anticipated d/c is  to: Home              Anticipated d/c  date is: 1 day              Patient currently is not medically stable to d/c.  Jonnie Finner DO [facility]  If 7PM-7AM, please contact night-coverage [email or web address]  01/18/2020, 10:15 AM

## 2020-01-18 NOTE — Consult Note (Signed)
Consultation Note Date: 01/18/2020   Patient Name: Andre Glass  DOB: 03-18-1951  MRN: [id number]  Age / Sex: 69 y.o., male  PCP: [name], DO Referring Physician: Jonnie Finner, DO  Reason for Consultation: Symptom Management.   HPI/Patient Profile: 69 y.o. male  admitted on 01/18/2020    Clinical Assessment and Goals of Care: 70 year old gentleman who follows with Dr. [name] for life limiting illness of prostate cancer with metastatic burden, chronic pain, chronic back pain.  Patient was in the emergency department earlier this month in October, 2021.  His imaging showed progressive bone disease.  Patient uses a cane however has been having more back pain radiating down both of his legs.  He was evaluated recently by his medical oncologist and the plan was made for the patient to undergo Port-A-Cath placement and be started on Taxotere.  Patient has been admitted to hospital medicine service on 01-18-20 with intractable pain.  Patient also complains of constipation.  Palliative consult for additional support, symptom management has been requested.  Patient has arrived upstairs from the emergency department.  Currently resting in bed.  I introduced myself and palliative care as follows: Palliative medicine is specialized medical care for people living with serious illness. It focuses on providing relief from the symptoms and stress of a serious illness. The goal is to improve quality of life for both the patient and the family.  Goals of care: Broad aims of medical therapy in relation to the patient's values and preferences. Our aim is to provide medical care aimed at enabling patients to achieve the goals that matter most to them, given the circumstances of their particular medical situation and their constraints.   Discussed about pain and nonpain symptom management in detail.  Patient states that he  takes scheduled long-acting morphine throughout the day as well as supplements that with by mouth Dilaudid pills for breakthrough pain.  This way he was able to manage his pain however has had exacerbation of his pain especially since the last week.  Prior to initiation of scheduled opioids, the patient did not have any history of constipation and he used to have bowel movements daily.  He states that now he has to take several things to have a bowel movement.  Patient is on MiraLAX Colace and Senokot at home.  Patient states that suppositories do not work for him and he usually uses fleets enema at home.  Patient denies any new numbness or tingling in his lower extremities.  Discussed about continuing current long-acting morphine by mouth as well as IV Dilaudid to be used on an as-needed basis.  Discussed about adding enema.  Will prescribe soapsuds enema x1, continue current bowel regimen and monitor.  If no results, will likely consider component of opioid-induced constipation and patient might benefit from Relistor at that time.  We will continue to monitor.  See below.  Thank you for the consult.  NEXT OF KIN  Brother michael Bovee is next of kin.   SUMMARY OF RECOMMENDATIONS  Full Code, Full Scope of care, patient awaits port a cath placement and initiation of Taxotere.  Continue current opioid regimen Augment current bowel regimen, add enema and monitor.  Thank you for the consult.   Code Status/Advance Care Planning:  Full code    Symptom Management:    as above   Palliative Prophylaxis:   Bowel Regimen  Additional Recommendations (Limitations, Scope, Preferences):  Full Scope Treatment  Psycho-social/Spiritual:   Desire for further Chaplaincy support:yes  Additional Recommendations: Caregiving  Support/Resources  Prognosis:   Unable to determine  Discharge Planning: To Be Determined      Primary Diagnoses: Present on Admission: . Intractable pain   I have  reviewed the medical record, interviewed the patient and family, and examined the patient. The following aspects are pertinent.  Past Medical History:  Diagnosis Date  . Anemia   . History of kidney stones   . Prostate cancer ([place])   . UTI (urinary tract infection)    Social History   Socioeconomic History  . Marital status: Single    Spouse name: Not on file  . Number of children: 0  . Years of education: Not on file  . Highest education level: Not on file  Occupational History  . Not on file  Tobacco Use  . Smoking status: Never Smoker  . Smokeless tobacco: Never Used  Vaping Use  . Vaping Use: Never used  Substance and Sexual Activity  . Alcohol use: Never  . Drug use: Never  . Sexual activity: Not Currently  Other Topics Concern  . Not on file  Social History Narrative  . Not on file   Social Determinants of Health   Financial Resource Strain:   . Difficulty of Paying Living Expenses: Not on file  Food Insecurity:   . Worried About Charity fundraiser in the Last Year: Not on file  . Ran Out of Food in the Last Year: Not on file  Transportation Needs:   . Lack of Transportation (Medical): Not on file  . Lack of Transportation (Non-Medical): Not on file  Physical Activity:   . Days of Exercise per Week: Not on file  . Minutes of Exercise per Session: Not on file  Stress:   . Feeling of Stress : Not on file  Social Connections:   . Frequency of Communication with Friends and Family: Not on file  . Frequency of Social Gatherings with Friends and Family: Not on file  . Attends Religious Services: Not on file  . Active Member of Clubs or Organizations: Not on file  . Attends Archivist Meetings: Not on file  . Marital Status: Not on file   Family History  Problem Relation Age of Onset  . Stomach cancer Mother   . Pancreatic cancer Sister   . Breast cancer Neg Hx   . Prostate cancer Neg Hx   . Colon cancer Neg Hx    Scheduled Meds: .  abiraterone acetate  1,000 mg Oral Daily  . docusate sodium  100 mg Oral BID  . enoxaparin (LOVENOX) injection  40 mg Subcutaneous Q24H  . morphine  30 mg Oral Q8H  . [START ON 01/19/2020] predniSONE  5 mg Oral Q breakfast  . senna  1 tablet Oral BID  . tamsulosin  0.4 mg Oral QPC supper   Continuous Infusions: . cefTRIAXone (ROCEPHIN)  IV Stopped (01/18/20 1056)   PRN Meds:.bisacodyl, hydrALAZINE, HYDROmorphone (DILAUDID) injection, ondansetron, oxybutynin, Plecanatide, polyethylene glycol Medications Prior to Admission:  Prior to Admission medications   Medication Sig Start Date End Date Taking? Authorizing Provider  abiraterone acetate (ZYTIGA) 250 MG tablet Take 4 tablets (1,000 mg total) by mouth daily. Take on an empty stomach 1 hour before or 2 hours after a meal 12/25/19  Yes Shadad, Mathis Dad, MD  docusate sodium (COLACE) 100 MG capsule Take 200 mg by mouth daily.    Yes [provider]  Ferrous Sulfate (IRON PO) Take 1 tablet by mouth daily.   Yes [provider]  HYDROmorphone (DILAUDID) 4 MG tablet Take 1 tablet (4 mg total) by mouth every 4 (four) hours as needed for severe pain. 12/06/19  Yes [name], MD  morphine (MS CONTIN) 30 MG 12 hr tablet Take 1 tablet (30 mg total) by mouth every 8 (eight) hours. 01/16/20  Yes [name], MD  Multiple Vitamin (MULTIVITAMIN WITH MINERALS) TABS tablet Take 1 tablet by mouth daily.   Yes [provider]  ondansetron (ZOFRAN) 4 MG tablet TAKE 1 TABLET (4 MG TOTAL) BY MOUTH 3 (THREE) TIMES DAILY. Patient taking differently: Take 4 mg by mouth 3 (three) times daily as needed for nausea or vomiting.  10/31/19  Yes [name], MD  oxybutynin (DITROPAN) 5 MG tablet Take 5 mg by mouth 3 (three) times daily as needed for bladder spasms. 10/28/19  Yes [provider]  Plecanatide (TRULANCE) 3 MG TABS Take 3 mg by mouth daily. Patient taking differently: Take 3 mg by mouth daily as needed (when taking  miralax).  10/27/19  Yes [name], [name], DO  polyethylene glycol (MIRALAX) 17 g packet Take 17 g by mouth daily. Patient taking differently: Take 17 g by mouth daily as needed for mild constipation.  06/18/19  Yes Dorie Rank, MD  predniSONE (DELTASONE) 5 MG tablet Take 1 tablet (5 mg total) by mouth daily with breakfast. 10/25/19  Yes Shadad, Mathis Dad, MD  prochlorperazine (COMPAZINE) 10 MG tablet Take 1 tablet (10 mg total) by mouth every 6 (six) hours as needed for nausea or vomiting. 01/15/20  Yes [name], MD  Pseudoephedrine HCl, Deter, 30 MG TABA Take 30 mg by mouth 4 (four) times daily as needed for congestion.    Yes [provider]  senna (SENOKOT) 8.6 MG TABS tablet Take 2 tablets by mouth daily.    Yes [provider]  tamsulosin (FLOMAX) 0.4 MG CAPS capsule Take 1 capsule (0.4 mg total) by mouth daily after supper. Patient taking differently: Take 0.4 mg by mouth daily as needed (to help with urination).  10/27/19  Yes [name], [name], DO  Artificial Saliva (BIOTENE ORALBALANCE DRY MOUTH) GEL Use as directed 1 Dose in the mouth or throat 4 (four) times daily -  before meals and at bedtime. Patient not taking: Reported on 01/18/2020 04/28/19   Tyler Pita, MD  lidocaine-prilocaine (EMLA) cream Apply 1 application topically as needed. 01/15/20   [name], MD  mirabegron ER (MYRBETRIQ) 25 MG TB24 tablet Take 1 tablet (25 mg total) by mouth daily. Patient not taking: Reported on 01/18/2020 10/27/19   [name], DO  phenazopyridine (PYRIDIUM) 100 MG tablet Take 1 tablet (100 mg total) by mouth 3 (three) times daily as needed for pain. Patient not taking: Reported on 10/20/2019 08/21/19   Harle Stanford., PA-C   Allergies  Allergen Reactions  . Ibuprofen Anaphylaxis  . Shrimp [Shellfish Allergy] Anaphylaxis   Review of Systems +back pain radiating to legs +chronic numbness in both feet  Physical Exam Awake alert resting in bed Mild distress S1-S2 Lungs  clear to auscultation Bilateral symmetrical lower extremity weakness No focal deficits Awake alert oriented Abdomen is not distended  Vital Signs: BP (!) 178/87 (BP Location: Left Arm)   Pulse 83   Temp 98.5 F (36.9 C) (Oral)   Resp 17   Ht 5\' 7"  (1.702 m)   Wt 94 kg   SpO2 99%   BMI 32.46 kg/m      Pain Score: 4    SpO2: SpO2: 99 % O2 Device:SpO2: 99 % O2 Flow Rate: .   IO: Intake/output summary:   Intake/Output Summary (Last 24 hours) at 01/18/2020 1345 Last data filed at 01/18/2020 1151 Gross per 24 hour  Intake 100 ml  Output 950 ml  Net -850 ml    LBM:   Baseline Weight: Weight: 94 kg Most recent weight: Weight: 94 kg     Palliative Assessment/Data:   PPS 50%  Time In:  12     Time Out:  1300 Time Total:  60  Greater than 50%  of this time was spent counseling and coordinating care related to the above assessment and plan.  Signed by: Loistine Chance, MD   Please contact Palliative Medicine Team phone at [phone] for questions and concerns.  For individual provider: See Shea Evans

## 2020-01-19 ENCOUNTER — Inpatient Hospital Stay: Payer: Medicare Other

## 2020-01-19 DIAGNOSIS — Z515 Encounter for palliative care: Secondary | ICD-10-CM | POA: Diagnosis not present

## 2020-01-19 DIAGNOSIS — R531 Weakness: Secondary | ICD-10-CM | POA: Diagnosis not present

## 2020-01-19 DIAGNOSIS — Z7189 Other specified counseling: Secondary | ICD-10-CM | POA: Diagnosis not present

## 2020-01-19 DIAGNOSIS — R52 Pain, unspecified: Secondary | ICD-10-CM | POA: Diagnosis not present

## 2020-01-19 DIAGNOSIS — K5900 Constipation, unspecified: Secondary | ICD-10-CM | POA: Diagnosis not present

## 2020-01-19 LAB — COMPREHENSIVE METABOLIC PANEL
ALT: 9 U/L (ref 0–44)
AST: 12 U/L — ABNORMAL LOW (ref 15–41)
Albumin: 3 g/dL — ABNORMAL LOW (ref 3.5–5.0)
Alkaline Phosphatase: 81 U/L (ref 38–126)
Anion gap: 9 (ref 5–15)
BUN: 16 mg/dL (ref 8–23)
CO2: 28 mmol/L (ref 22–32)
Calcium: 9.4 mg/dL (ref 8.9–10.3)
Chloride: 101 mmol/L (ref 98–111)
Creatinine, Ser: 1.31 mg/dL — ABNORMAL HIGH (ref 0.61–1.24)
GFR, Estimated: 55 mL/min — ABNORMAL LOW (ref >60)
Glucose, Bld: 101 mg/dL — ABNORMAL HIGH (ref 70–99)
Potassium: 4.4 mmol/L (ref 3.5–5.1)
Sodium: 138 mmol/L (ref 135–145)
Total Bilirubin: 0.5 mg/dL (ref 0.3–1.2)
Total Protein: 8.1 g/dL (ref 6.5–8.1)

## 2020-01-19 LAB — CBC
HCT: 33.2 % — ABNORMAL LOW (ref 39.0–52.0)
Hemoglobin: 10.8 g/dL — ABNORMAL LOW (ref 13.0–17.0)
MCH: 30.9 pg (ref 26.0–34.0)
MCHC: 32.5 g/dL (ref 30.0–36.0)
MCV: 94.9 fL (ref 80.0–100.0)
Platelets: 407 10*3/uL — ABNORMAL HIGH (ref 150–400)
RBC: 3.5 MIL/uL — ABNORMAL LOW (ref 4.22–5.81)
RDW: 11.9 % (ref 11.5–15.5)
WBC: 9.6 10*3/uL (ref 4.0–10.5)
nRBC: 0 % (ref 0.0–0.2)

## 2020-01-19 LAB — IRON AND TIBC
Iron: 47 ug/dL (ref 45–182)
Saturation Ratios: 25 % (ref 17.9–39.5)
TIBC: 185 ug/dL — ABNORMAL LOW (ref 250–450)
UIBC: 138 ug/dL

## 2020-01-19 MED ORDER — POLYETHYLENE GLYCOL 3350 17 G PO PACK: 17.0000 g | PACK | Freq: Two times a day (BID) | ORAL | 0 refills | Status: DC

## 2020-01-19 NOTE — Discharge Instructions (Signed)

## 2020-01-19 NOTE — Progress Notes (Signed)
Pt discharged home in stable condition. Discharge instructions given. Script sent to pharmacy of choice. No immediate questions or concerns at this time. Discharged from unit via wheelchair.  

## 2020-01-19 NOTE — TOC Initial Note (Signed)
Transition of Care [facility]) - Initial/Assessment Note    Patient Details  Name: Andre Glass MRN: [id number] Date of Birth: 07/12/1950  Transition of [facility]) CM/SW Contact:    Lynnell Catalan, RN Phone Number: 01/19/2020, 3:40 PM  Clinical Narrative:                  This CM spoke with pt for dc planning. Physical therapy recommendations gone over with pt and choice offered for home health services. [facility] chosen and [facility] contacted for referral. Will need orders for HHPT at dc.    Activities of Daily Living Home Assistive Devices/Equipment: Cane (specify quad or straight) (quad) ADL Screening (condition at time of admission) Patient's cognitive ability adequate to safely complete daily activities?: Yes Is the patient deaf or have difficulty hearing?: No Does the patient have difficulty seeing, even when wearing glasses/contacts?: No Does the patient have difficulty concentrating, remembering, or making decisions?: No Patient able to express need for assistance with ADLs?: Yes Does the patient have difficulty dressing or bathing?: Yes Independently performs ADLs?: No Communication: Independent Does the patient have difficulty walking or climbing stairs?: Yes Weakness of Legs: Both Weakness of Arms/Hands: None   Admission diagnosis:  Back pain [M54.9] Intractable pain [R52] Patient Active Problem List   Diagnosis Date Noted  . Intractable pain 01/18/2020  . BPH (benign prostatic hyperplasia) 11/08/2019  . Catheter-associated urinary tract infection ([place]) 09/14/2019  . Drug induced constipation 07/18/2019  . Goals of care, counseling/discussion 06/26/2019  . 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 Malignant neoplasm of prostate ([place]) 04/19/2019   PCP:  [name], DO Pharmacy:   [id number] - [place], [address] [place] [address] Phone: [phone] Fax: [phone]  [place], [address] [place] [address] Phone: [phone] Fax: [phone]     Social Determinants of Health (SDOH) Interventions    Readmission Risk Interventions No flowsheet data found.

## 2020-01-19 NOTE — Evaluation (Signed)
Physical Therapy Evaluation Patient Details Name: Andre Glass MRN: [id number] DOB: 08-01-1950 Today's Date: 01/19/2020   History of Present Illness  69 y.o. male with medical history significant of prostate CA w/ mets, chronic pain.recent visit with oncologist d/t incr pain,  had an MRI performed which showed stable appearance of multifocal osseous metastatic disease and multilevel disc disease w/ foraminal narrowing. No evidence of cord compression.  Clinical Impression  Pt admitted with above diagnosis.  Pt with limited mobility d/t incr pain with WBing. Able to amb 30' with min assist and RW, one rest break d/t elevated pain.  Pt is alone at home other than caregivers 2 hrs in am  That assist with meals, light household duties. Pt will need HHPT, may need w/c, will continue to follow in acute setting.  Pt does not appear ready to d/c given pain level with mobility (placing pt at risk for falls)--- however RN is medicating pt now as he declined meds earlier. If pain controlled he may quickly return to his baseline mobility status.    Pt currently with functional limitations due to the deficits listed below (see PT Problem List). Pt will benefit from skilled PT to increase their independence and safety with mobility to allow discharge to the venue listed below.       Follow Up Recommendations Home health PT    Equipment Recommendations  Wheelchair cushion (measurements PT) (may need w/c)    Recommendations for Other Services       Precautions / Restrictions Precautions Precautions: Fall Restrictions Weight Bearing Restrictions: No      Mobility  Bed Mobility               General bed mobility comments: NT--in recliner  Transfers Overall transfer level: Needs assistance Equipment used: Rolling walker (2 wheeled);Quad cane Transfers: Sit to/from Stand Sit to Stand: Min assist;Min guard         General transfer comment: min to min/guard to safely rise and transition  to device. cues for safety and hand placement  Ambulation/Gait Ambulation/Gait assistance: Min assist;Min guard Gait Distance (Feet): 30 Feet Assistive device: Rolling walker (2 wheeled);Quad cane Gait Pattern/deviations: Step-to pattern;Decreased stance time - left;Decreased step length - left;Decreased step length - right     General Gait Details: cues for sequence. began with QC, switched to RW d/t incr pain with mobility. one standing break d/t incr pain. (pt declined meds prior to mobility per RN )  Stairs            Wheelchair Mobility    Modified Rankin (Stroke Patients Only)       Balance Overall balance assessment: Needs assistance Sitting-balance support: No upper extremity supported;Feet supported Sitting balance-Leahy Scale: Fair       Standing balance-Leahy Scale: Poor Standing balance comment: reliant on UEs d/t pain                             Pertinent Vitals/Pain Pain Assessment: Faces Faces Pain Scale: Hurts worst Pain Location: pain 0/10 at rest; incr pain with mobility Pain Descriptors / Indicators: Grimacing;Sore;Sharp Pain Intervention(s): Limited activity within patient's tolerance;Monitored during session;Patient requesting pain meds-RN notified    Home Living Family/patient expects to be discharged to:: Private residence Living Arrangements: Alone Available Help at Discharge: Personal care attendant Type of Home: House Home Access: Stairs to enter   [employer] of Steps: 2 Home Layout: One [place]: [place] - 2 wheels;Cane -  quad Additional Comments: caregiver 2 hrs per day 5x/wk    Prior Function Level of Independence: Independent with assistive device(s)         Comments: amb with cane or RW     Hand Dominance        Extremity/Trunk Assessment   Upper Extremity Assessment Upper Extremity Assessment: Overall WFL for tasks assessed    Lower Extremity Assessment Lower Extremity  Assessment: RLE deficits/detail;LLE deficits/detail RLE Deficits / Details: grossly WFL RLE: Unable to fully assess due to pain LLE Deficits / Details: AROM grossly WFL per obs. strength ~2+/5  hip, limited by pain; ankle WFL, denies N or T       Communication   Communication: No difficulties  Cognition Arousal/Alertness: Awake/alert Behavior During Therapy: WFL for tasks assessed/performed Overall Cognitive Status: Within Functional Limits for tasks assessed                                 General Comments: pt is agreeable and pleasant      General Comments      Exercises     Assessment/Plan    PT Assessment Patient needs continued PT services  PT Problem List Decreased strength;Decreased activity tolerance;Decreased knowledge of use of DME;Decreased mobility;Pain       PT Treatment Interventions DME instruction;Therapeutic activities;Gait training;Functional mobility training;Therapeutic exercise;Patient/family education    PT Goals (Current goals can be found in the Care Plan section)  Acute Rehab PT Goals Patient Stated Goal: less pain, go home PT Goal Formulation: With patient Time For Goal Achievement: 02/02/20 Potential to Achieve Goals: Fair    Frequency Min 3X/week   Barriers to discharge        Co-evaluation               AM-PAC PT "6 Clicks" Mobility  Outcome Measure Help needed turning from your back to your side while in a flat bed without using bedrails?: A Little Help needed moving from lying on your back to sitting on the side of a flat bed without using bedrails?: A Little Help needed moving to and from a bed to a chair (including a wheelchair)?: A Little Help needed standing up from a chair using your arms (e.g., wheelchair or bedside chair)?: A Little Help needed to walk in hospital room?: A Little Help needed climbing 3-5 steps with a railing? : A Lot 6 Click Score: 17    End of Session Equipment Utilized During  Treatment: Gait belt Activity Tolerance: Patient limited by pain Patient left: in chair;with call bell/phone within reach Nurse Communication: Mobility status;Patient requests pain meds PT Visit Diagnosis: Difficulty in walking, not elsewhere classified (R26.2)    Time: 2620-3559 PT Time Calculation (min) (ACUTE ONLY): 22 min   Charges:   PT Evaluation $PT Eval Low Complexity: [place], PT  Acute Rehab Dept ([place]) [phone] Pager [phone]  01/19/2020     [facility] 01/19/2020, 2:56 PM

## 2020-01-19 NOTE — Discharge Summary (Signed)
Physician Discharge Summary  Andre Glass MLJ:[id number] DOB: Feb 13, 1951  PCP: Andre Del, DO  Admitted from: Home Discharged to: Home  Admit date: 01/18/2020 Discharge date: 01/19/2020  Recommendations for Outpatient Follow-up:    Follow-up Information    Care, [facility] Follow up.   Specialty: [place] Why: Home physical therapy Contact information: [address] [place] [address] [phone]        Andre Del, DO. Schedule an appointment as soon as possible for a visit.   Specialty: Family Medicine Contact information: [address] [address] [phone]        Andre Portela, MD Follow up.   Specialty: Oncology Why: Keep the prior appointment you have for next week. Contact information: [place] [address] [phone]        Andre Mallow, MD. Schedule an appointment as soon as possible for a visit.   Specialty: Urology Contact information: [place] [phone] [place]: Physical therapy Equipment/Devices: Wheelchair cushion  Discharge Condition: Improved and stable CODE STATUS: Full Diet recommendation: Heart healthy diet.  Discharge Diagnoses:  Active Problems:   Intractable pain   Brief Summary: 69 year old male, lives alone, ambulates with the help of a cane, PMH of prostate cancer with metastasis, chronic pain, chronic back pain, seen in the emergency department earlier this month in October 2021.  His imaging showed progressive bone disease.  He uses a cane however has been having more back pain radiating down both of his legs.  He was evaluated recently by his medical oncologist and the plan was made for the patient to undergo Port-A-Cath placement and be started on Taxotere.  He now presented to the ED on 01/18/2020 due to progressive low back pain, severe, radiating down both legs to his toes,  worse on the left, associated constipation without a BM for 8 days PTA and nausea without vomiting.  His pain had not been relieved by his home opioid regimen although he indicated that his oncologist had recently increased the dose of his medications but he was yet to pick up that prescription.  He had an MRI recently that showed stable appearance of multifocal osseous metastatic disease in multilevel disc disease with foraminal narrowing but no evidence of cord compression.  He denied incontinence or any other sensory abnormalities.  He reported frequent urination with sense of incomplete emptying, some odor to his urine but specifically denied dysuria, fever, chills, suprapubic pain.  He reported being on antibiotics 4 times this year for UTI and indicated that current presentation was not similar to previous UTIs.  He was admitted for further evaluation and management of his intractable back pain and constipation.  Assessment and plan:  Intractable low back pain:  Most likely due to metastatic prostate cancer.  Started him on most recent home opioid regimen with good control of his pain.  He has required infrequent, only 2 doses of IV Dilaudid since admission.  It is likely that his constipation contributed to his worsening pain.  He was advised to pick up his new prescriptions for increased dose of opioids prescribed by his oncologist and continue same regimen.  I discussed with Andre Glass, who agrees with this plan and will follow up closely as outpatient.  Palliative care medicine also consulted and assisted in care.  CT L-spine: No acute abnormality.  Sclerotic lesions consistent with metastatic disease are unchanged since the patient's 01/12/2020 exam.  Opioid-induced constipation:  It appears that patient may not have been taking the complete bowel regimen that he was supposed to take at home.  Advised compliance with his bowel regimen and added MiraLAX as well.  Further follow-up  with his oncologist as outpatient.  Stage IIIa chronic kidney disease:  Unclear as to his baseline creatinine.  It was normal until July 2021.  In August he had a creatinine of 1.35, again normal creatinine on 01/12/2020.  Currently in the 1.2-1.3 range.  This may be related to some degree of obstructive uropathy and he did receive contrast for CT on admission.  Please see discussion below.  Close follow-up of BMP as outpatient  Bilateral hydroureteronephrosis:  CT abdomen and pelvis with contrast 10/14 showed stable bilateral hydroureteronephrosis, right greater than left, presumably secondary to obstruction at the level of the ureterovesical junction bilaterally from tumor, which may represent malignancy extending from the prostate gland, although primary bladder malignancy cannot be excluded.  I discussed these findings in detail with Andre Glass.  He assured me that he will closely follow this up as outpatient and coordinate care with patient's primary urologist Dr. Gloriann Glass.  Anemia of chronic disease/malignancy:  Stable.  Outpatient follow-up.  Asymptomatic bacteriuria:  Patient had been empirically started on IV ceftriaxone.  After careful and detailed review including history from patient, lack of fever, leukocytosis, not convinced that he truly has a UTI.  His urinary frequency, incomplete emptying are likely related to obstructive symptoms from prostate cancer.  Thereby decision made not to discharge on antibiotics.  Also recommended that he take Flomax daily rather than as needed although not sure how much difference this will make.  He is aware to seek immediate medical attention for symptoms suggestive of UTI as noted above.  Prostate cancer with skeletal mets:  Discussed in detail with oncology who have a follow-up with him early next week.  Elevated blood pressures:  Noted on admission.  Likely precipitated by uncontrolled pain.  Improved after pain control.  Outpatient  follow-up.  Consultations:  Palliative care medicine  Procedures:  None   Discharge Instructions  Discharge Instructions    Call MD for:  difficulty breathing, headache or visual disturbances   Complete by: As directed    Call MD for:  extreme fatigue   Complete by: As directed    Call MD for:  persistant dizziness or light-headedness   Complete by: As directed    Call MD for:  persistant nausea and vomiting   Complete by: As directed    Call MD for:  severe uncontrolled pain   Complete by: As directed    Call MD for:  temperature >100.4   Complete by: As directed    Diet - low sodium heart healthy   Complete by: As directed    Increase activity slowly   Complete by: As directed        Medication List    STOP taking these medications   Biotene OralBalance Dry Mouth Gel   mirabegron ER 25 MG Tb24 tablet Commonly known as: Myrbetriq   phenazopyridine 100 MG tablet Commonly known as: Pyridium     TAKE these medications   abiraterone acetate 250 MG tablet Commonly known as: ZYTIGA Take 4 tablets (1,000 mg total) by mouth daily. Take on an empty stomach 1 hour before or 2 hours after a meal   docusate sodium 100 MG capsule Commonly known  as: COLACE Take 200 mg by mouth daily.   HYDROmorphone 4 MG tablet Commonly known as: Dilaudid Take 1 tablet (4 mg total) by mouth every 4 (four) hours as needed for severe pain.   IRON PO Take 1 tablet by mouth daily.   lidocaine-prilocaine cream Commonly known as: EMLA Apply 1 application topically as needed.   morphine 30 MG 12 hr tablet Commonly known as: MS CONTIN Take 1 tablet (30 mg total) by mouth every 8 (eight) hours.   multivitamin with minerals Tabs tablet Take 1 tablet by mouth daily.   ondansetron 4 MG tablet Commonly known as: ZOFRAN TAKE 1 TABLET (4 MG TOTAL) BY MOUTH 3 (THREE) TIMES DAILY. What changed:   when to take this  reasons to take this   oxybutynin 5 MG tablet Commonly known as:  DITROPAN Take 5 mg by mouth 3 (three) times daily as needed for bladder spasms.   polyethylene glycol 17 g packet Commonly known as: MiraLax Take 17 g by mouth 2 (two) times daily. What changed: when to take this   predniSONE 5 MG tablet Commonly known as: DELTASONE Take 1 tablet (5 mg total) by mouth daily with breakfast.   prochlorperazine 10 MG tablet Commonly known as: COMPAZINE Take 1 tablet (10 mg total) by mouth every 6 (six) hours as needed for nausea or vomiting.   Pseudoephedrine HCl (Deter) 30 MG Taba Take 30 mg by mouth 4 (four) times daily as needed for congestion.   senna 8.6 MG Tabs tablet Commonly known as: SENOKOT Take 2 tablets by mouth daily.   tamsulosin 0.4 MG Caps capsule Commonly known as: FLOMAX Take 1 capsule (0.4 mg total) by mouth daily after supper. What changed:   when to take this  reasons to take this   Trulance 3 MG Tabs Generic drug: Plecanatide Take 3 mg by mouth daily. What changed:   when to take this  reasons to take this      Allergies  Allergen Reactions  . Ibuprofen Anaphylaxis  . Shrimp [Shellfish Allergy] Anaphylaxis      Procedures/Studies:   CT ABDOMEN PELVIS W CONTRAST  Result Date: 01/18/2020 CLINICAL DATA:  Left upper quadrant abdominal pain. History of metastatic prostate cancer. EXAM: CT ABDOMEN AND PELVIS WITH CONTRAST TECHNIQUE: Multidetector CT imaging of the abdomen and pelvis was performed using the standard protocol following bolus administration of intravenous contrast. CONTRAST:  121mL OMNIPAQUE IOHEXOL 300 MG/ML  SOLN COMPARISON:  January 12, 2020. FINDINGS: Lower chest: No acute abnormality. Hepatobiliary: No focal liver abnormality is seen. No gallstones, gallbladder wall thickening, or biliary dilatation. Pancreas: Unremarkable. No pancreatic ductal dilatation or surrounding inflammatory changes. Spleen: Normal in size without focal abnormality. Adrenals/Urinary Tract: Adrenal glands appear normal.  Stable bilateral hydroureteronephrosis is noted, right greater than left, presumably secondary to obstruction at the level of the ureterovesical junction bilaterally from tumor, which may represent malignancy extending from the prostate gland, although primary bladder malignancy cannot be excluded. No renal or ureteral calculi are noted. Stomach/Bowel: Stomach is within normal limits. Appendix appears normal. No evidence of bowel wall thickening, distention, or inflammatory changes. Vascular/Lymphatic: Atherosclerosis of thoracic aorta is noted. Stable left periaortic adenopathy is noted measuring 22 x 11 mm. Smaller retroperitoneal lymph nodes are noted. 18 x 17 mm lymph node is noted to the right of the rectum concerning for metastatic disease. Reproductive: As noted above, prostate gland is mildly enlarged and appears to be extending into the inferior posterior aspect of the urinary bladder consistent  with the history of malignancy. Other: No abdominal wall hernia or abnormality. No abdominopelvic ascites. Musculoskeletal: Multiple sclerotic osseous metastases are again noted in the visualized spine and pelvis. IMPRESSION: 1. Stable bilateral hydroureteronephrosis is noted, right greater than left, presumably secondary to obstruction at the level of the ureterovesical junction bilaterally from tumor, which may represent malignancy extending from the prostate gland, although primary bladder malignancy cannot be excluded. 2. Stable retroperitoneal adenopathy is noted concerning for metastatic disease. 3. Multiple sclerotic osseous metastases are again noted in the visualized spine and pelvis. 4. Aortic atherosclerosis. Aortic Atherosclerosis (ICD10-I70.0). Electronically Signed   By: Marijo Conception M.D.   On: 01/18/2020 09:42   CT L-SPINE NO CHARGE  Result Date: 01/18/2020 CLINICAL DATA:  Low back pain in a patient with a history of metastatic prostate cancer. No known injury. EXAM: CT LUMBAR SPINE WITHOUT  CONTRAST TECHNIQUE: Multidetector CT imaging of the lumbar spine was performed without intravenous contrast administration. Multiplanar CT image reconstructions were also generated. COMPARISON:  CT abdomen and pelvis this same day and 01/12/2020. FINDINGS: Segmentation: Standard. Alignment: Maintained. Vertebrae: As on the 01/12/2020 exam, multiple sclerotic lesions are identified consistent with metastatic prostate cancer. Largest deposit is in T12 which is nearly completely replaced. There is no fracture or new lesion. Paraspinal and other soft tissues: See report of dedicated abdomen and pelvis CT today. Disc levels: T11-12: Mild facet degenerative disease. Tiny left paracentral protrusion. No stenosis. T12-L1: Mild-to-moderate facet arthropathy is more notable on the left. Minimal disc bulge. No stenosis. L1-2: Negative. L2-3: Negative. L3-4: Mild facet degenerative disease and a shallow disc bulge. The central canal and left foramen are open. Mild right foraminal narrowing. L4-5: Mild facet degenerative disease and a shallow disc bulge. The central canal is open. Moderate foraminal narrowing appears worse on the right. L5-S1: Shallow disc bulge and endplate spur. Mild facet arthropathy. The central canal is open. Moderate bilateral foraminal narrowing appears worse on the left. IMPRESSION: No acute abnormality. Sclerotic lesions consistent with metastatic disease are unchanged since the patient's 01/12/2020 exam. Spondylosis is most notable at L4-5 and L5-S1 where it results in moderate to bilateral foraminal narrowing. Electronically Signed   By: [name] M.D.   On: 01/18/2020 09:42    Subjective: Patient interviewed and examined in the presence of his male family friend at bedside after he gave permission.  Patient reports feeling much better.  Had a good BM after enema yesterday.  Passing flatus.  No nausea, vomiting.  Low back pain now better controlled, 3/10 in severity.  Rest of history as  noted above.  Discharge Exam:  Vitals:   01/18/20 1926 01/18/20 2334 01/19/20 0542 01/19/20 1536  BP: (!) 164/96 (!) 162/93 138/77 130/82  Pulse: 86 88 81 76  Resp: 16 14 16 18   Temp: 98.4 F (36.9 C) 98.2 F (36.8 C) (!) 97.3 F (36.3 C) 97.9 F (36.6 C)  TempSrc: Oral Oral Oral Oral  SpO2: 100% 96% 100% 98%  Weight:      Height:        General: Pleasant middle-age male, moderately built and overweight sitting up comfortably in chair and working on his laptop. Cardiovascular: S1 & S2 heard, RRR, S1/S2 +. No murmurs, rubs, gallops or clicks. No JVD or pedal edema. Respiratory: Clear to auscultation without wheezing, rhonchi or crackles. No increased work of breathing. Abdominal:  Non distended, non tender & soft. No organomegaly or masses appreciated. Normal bowel sounds heard. CNS: Alert and oriented. No focal  deficits. Extremities: no edema, no cyanosis.  Grade 5 x 5 power in upper extremities.  At least grade 4+ by 5 power in lower extremities.  Some limitation of LE power assessment due to pain.  No sensory abnormalities noted.    The results of significant diagnostics from this hospitalization (including imaging, microbiology, ancillary and laboratory) are listed below for reference.     Microbiology: Recent Results (from the past 240 hour(s))  Respiratory Panel by RT PCR (Flu A&B, Covid) - Nasopharyngeal Swab     Status: None   Collection Time: 01/12/20 10:23 AM   Specimen: Nasopharyngeal Swab  Result Value Ref Range Status   SARS Coronavirus 2 by RT PCR NEGATIVE NEGATIVE Final    Comment: (NOTE) SARS-CoV-2 target nucleic acids are NOT DETECTED.  The SARS-CoV-2 RNA is generally detectable in upper respiratoy specimens during the acute phase of infection. The lowest concentration of SARS-CoV-2 viral copies this assay can detect is 131 copies/mL. A negative result does not preclude SARS-Cov-2 infection and should not be used as the sole basis for treatment or other  patient management decisions. A negative result may occur with  improper specimen collection/handling, submission of specimen other than nasopharyngeal swab, presence of viral mutation(s) within the areas targeted by this assay, and inadequate number of viral copies (<131 copies/mL). A negative result must be combined with clinical observations, patient history, and epidemiological information. The expected result is Negative.  Fact Sheet for Patients:  PinkCheek.be  Fact Sheet for Healthcare Providers:  GravelBags.it  This test is no t yet approved or cleared by the [place] FDA and  has been authorized for detection and/or diagnosis of SARS-CoV-2 by FDA under an Emergency Use Authorization (EUA). This EUA will remain  in effect (meaning this test can be used) for the duration of the COVID-19 declaration under Section 564(b)(1) of the Act, 21 U.S.C. section 360bbb-3(b)(1), unless the authorization is terminated or revoked sooner.     Influenza A by PCR NEGATIVE NEGATIVE Final   Influenza B by PCR NEGATIVE NEGATIVE Final    Comment: (NOTE) The Xpert Xpress SARS-CoV-2/FLU/RSV assay is intended as an aid in  the diagnosis of influenza from Nasopharyngeal swab specimens and  should not be used as a sole basis for treatment. Nasal washings and  aspirates are unacceptable for Xpert Xpress SARS-CoV-2/FLU/RSV  testing.  Fact Sheet for Patients: PinkCheek.be  Fact Sheet for Healthcare Providers: GravelBags.it  This test is not yet approved or cleared by the [place] FDA and  has been authorized for detection and/or diagnosis of SARS-CoV-2 by  FDA under an Emergency Use Authorization (EUA). This EUA will remain  in effect (meaning this test can be used) for the duration of the  Covid-19 declaration under Section 564(b)(1) of the Act, 21  U.S.C. section  360bbb-3(b)(1), unless the authorization is  terminated or revoked. Performed at [facility], [place] [address], [place], [place] [address]   Resp Panel by RT PCR (RSV, Flu A&B, Covid) - Nasopharyngeal Swab     Status: None   Collection Time: 01/18/20  8:12 AM   Specimen: Nasopharyngeal Swab  Result Value Ref Range Status   SARS Coronavirus 2 by RT PCR NEGATIVE NEGATIVE Final    Comment: (NOTE) SARS-CoV-2 target nucleic acids are NOT DETECTED.  The SARS-CoV-2 RNA is generally detectable in upper respiratoy specimens during the acute phase of infection. The lowest concentration of SARS-CoV-2 viral copies this assay can detect is 131 copies/mL. A negative result does  not preclude SARS-Cov-2 infection and should not be used as the sole basis for treatment or other patient management decisions. A negative result may occur with  improper specimen collection/handling, submission of specimen other than nasopharyngeal swab, presence of viral mutation(s) within the areas targeted by this assay, and inadequate number of viral copies (<131 copies/mL). A negative result must be combined with clinical observations, patient history, and epidemiological information. The expected result is Negative.  Fact Sheet for Patients:  PinkCheek.be  Fact Sheet for Healthcare Providers:  GravelBags.it  This test is no t yet approved or cleared by the [place] FDA and  has been authorized for detection and/or diagnosis of SARS-CoV-2 by FDA under an Emergency Use Authorization (EUA). This EUA will remain  in effect (meaning this test can be used) for the duration of the COVID-19 declaration under Section 564(b)(1) of the Act, 21 U.S.C. section 360bbb-3(b)(1), unless the authorization is terminated or revoked sooner.     Influenza A by PCR NEGATIVE NEGATIVE Final   Influenza B by PCR NEGATIVE NEGATIVE Final    Comment:  (NOTE) The Xpert Xpress SARS-CoV-2/FLU/RSV assay is intended as an aid in  the diagnosis of influenza from Nasopharyngeal swab specimens and  should not be used as a sole basis for treatment. Nasal washings and  aspirates are unacceptable for Xpert Xpress SARS-CoV-2/FLU/RSV  testing.  Fact Sheet for Patients: PinkCheek.be  Fact Sheet for Healthcare Providers: GravelBags.it  This test is not yet approved or cleared by the [place] FDA and  has been authorized for detection and/or diagnosis of SARS-CoV-2 by  FDA under an Emergency Use Authorization (EUA). This EUA will remain  in effect (meaning this test can be used) for the duration of the  Covid-19 declaration under Section 564(b)(1) of the Act, 21  U.S.C. section 360bbb-3(b)(1), unless the authorization is  terminated or revoked.    Respiratory Syncytial Virus by PCR NEGATIVE NEGATIVE Final    Comment: (NOTE) Fact Sheet for Patients: PinkCheek.be  Fact Sheet for Healthcare Providers: GravelBags.it  This test is not yet approved or cleared by the [place] FDA and  has been authorized for detection and/or diagnosis of SARS-CoV-2 by  FDA under an Emergency Use Authorization (EUA). This EUA will remain  in effect (meaning this test can be used) for the duration of the  COVID-19 declaration under Section 564(b)(1) of the Act, 21 U.S.C.  section 360bbb-3(b)(1), unless the authorization is terminated or  revoked. Performed at [facility], [place] [address], [place], [place] [address]      Labs: CBC: Recent Labs  Lab 01/18/20 0700 01/19/20 0540  WBC 9.2 9.6  NEUTROABS 6.4  --   HGB 11.8* 10.8*  HCT 36.6* 33.2*  MCV 93.4 94.9  PLT 476* 407*    Basic Metabolic Panel: Recent Labs  Lab 01/18/20 0700 01/19/20 0540  NA 134* 138  K 3.8 4.4  CL 96* 101  CO2 28 28  GLUCOSE 142*  101*  BUN 20 16  CREATININE 1.21 1.31*  CALCIUM 9.4 9.4    Liver Function Tests: Recent Labs  Lab 01/18/20 0700 01/19/20 0540  AST 14* 12*  ALT 10 9  ALKPHOS 89 81  BILITOT 0.5 0.5  PROT 8.7* 8.1  ALBUMIN 3.2* 3.0*    Anemia work up Recent Labs    01/19/20 0535  TIBC 185*  IRON 47    Urinalysis    Component Value Date/Time   COLORURINE YELLOW 01/18/2020 0848   APPEARANCEUR CLOUDY (  A) 01/18/2020 0848   LABSPEC 1.009 01/18/2020 0848   PHURINE 6.0 01/18/2020 0848   GLUCOSEU NEGATIVE 01/18/2020 0848   HGBUR MODERATE (A) 01/18/2020 0848   BILIRUBINUR NEGATIVE 01/18/2020 0848   BILIRUBINUR negative 09/14/2019 1105   KETONESUR NEGATIVE 01/18/2020 0848   PROTEINUR 30 (A) 01/18/2020 0848   UROBILINOGEN 0.2 09/14/2019 1105   NITRITE POSITIVE (A) 01/18/2020 0848   LEUKOCYTESUR LARGE (A) 01/18/2020 0848    Discussed in detail with patient's male family member at bedside after patient provided permission.  However neither patient nor the family member would identify their relationship  Time coordinating discharge: 35 minutes  SIGNED:  Vernell Leep, MD, [place], [facility]. [facility]  To contact the attending provider between 7A-7P or the covering provider during after hours 7P-7A, please log into the web site [email or web address] and access using universal [place] password for that web site. If you do not have the password, please call the hospital operator.

## 2020-01-19 NOTE — Progress Notes (Signed)
Brief Antibiotic recommendation note: per MD request  Discussed with ID Pharmacist  Current abx: D2 CTX 1gm q24 Afeb, WBC wnl, not symptomatic on admit 10/14 UCx obtained: after abx had been started  If asymptomatic would not treat -  to avoid abx exposure overall, UA and Ucx being positive does not warrant therapy without symptoms.  But if we have to treat then could use Bactrim if no urine culture result to guide tx . Would avoid Keflex - note previous Morganella > that can be an AmpC producing organism, however it was sensitive to ceftriaxone so possibly could use cefdinir for a few days since source is urinary and the course would be short - Cefdinir better for renal function than bactrim, but should only need  3 days of bactrim for UTI so believe pt could tolerate.   Minda Ditto PharmD 01/19/2020, 11:52 AM

## 2020-01-19 NOTE — Progress Notes (Signed)
Daily Progress Note   Patient Name: Andre Glass       Date: 01/19/2020 DOB: 05/21/1950  Age: 69 y.o. MRN#: [id number] Attending Physician: Modena Jansky, MD Primary Care Physician: [name], DO Admit Date: 01/18/2020  Reason for Consultation/Follow-up: Non pain symptom management and Pain control  Subjective:  patient did have a bowel movement yesterday, after the enema.  His pain is much better controlled, he is awake alert, sitting up in a chair, is working on his laptop. States that his pain is around 2-3/10 currently.   Length of Stay: 0  Current Medications: Scheduled Meds:  . abiraterone acetate  1,000 mg Oral Daily  . docusate sodium  100 mg Oral BID  . enoxaparin (LOVENOX) injection  40 mg Subcutaneous Q24H  . morphine  30 mg Oral Q8H  . predniSONE  5 mg Oral Q breakfast  . senna  1 tablet Oral BID  . tamsulosin  0.4 mg Oral QPC supper    Continuous Infusions: . cefTRIAXone (ROCEPHIN)  IV 1 g (01/19/20 1044)    PRN Meds: bisacodyl, hydrALAZINE, HYDROmorphone (DILAUDID) injection, ondansetron, oxybutynin, Plecanatide, polyethylene glycol  Physical Exam         Awake alert No distress Regular work of breathing S 1 S 2  No edema Non focal Abdomen not distended  Vital Signs: BP 138/77 (BP Location: Left Arm)   Pulse 81   Temp (!) 97.3 F (36.3 C) (Oral)   Resp 16   Ht 5\' 7"  (1.702 m)   Wt 94 kg   SpO2 100%   BMI 32.46 kg/m  SpO2: SpO2: 100 % O2 Device: O2 Device: Room Air O2 Flow Rate:    Intake/output summary:   Intake/Output Summary (Last 24 hours) at 01/19/2020 1129 Last data filed at 01/18/2020 1500 Gross per 24 hour  Intake 650 ml  Output 650 ml  Net 0 ml   LBM: Last BM Date: 01/18/20 Baseline Weight: Weight: 94 kg Most recent  weight: Weight: 94 kg       Palliative Assessment/Data:    PPS 50%  Patient Active Problem List   Diagnosis Date Noted  . Intractable pain 01/18/2020  . BPH (benign prostatic hyperplasia) 11/08/2019  . Catheter-associated urinary tract infection ([place]) 09/14/2019  . Drug induced constipation 07/18/2019  . Goals of care, counseling/discussion  06/26/2019  . 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 Malignant neoplasm of prostate ([place]) 04/19/2019    Palliative Care Assessment & Plan   Patient Profile:  69 year old gentleman who follows with Dr. [name] for life limiting illness of prostate cancer with metastatic burden, chronic pain, chronic back pain.  Patient was in the emergency department earlier this month in October, 2021.  His imaging showed progressive bone disease.  Patient uses a cane however has been having more back pain radiating down both of his legs.  He was evaluated recently by his medical oncologist and the plan was made for the patient to undergo Port-A-Cath placement and be started on Taxotere.  Patient has been admitted to hospital medicine service on 01-18-20 with intractable pain.  Patient also complains of constipation.  Palliative consult for additional support, symptom management has been requested.  Assessment:  constipation Uncontrolled pain  Recommendations/Plan:   continue current bowel and pain regimen, discussed with patient and TRH MD. Recommend med onc follow up soon.   Goals of Care and Additional Recommendations:  Limitations on Scope of Treatment: Full Scope Treatment  Code Status:    Code Status Orders  (From admission, onward)         Start     Ordered   01/18/20 1136  Full code  Continuous        01/18/20 1135        Code Status History    Date Active Date Inactive Code Status Order ID Comments User Context   11/08/2019 1331 11/09/2019 1732 Full Code [id number]  [name], MD Inpatient   Advance Care Planning Activity        Prognosis:   Unable to determine  Discharge Planning:  Home with [place] was discussed with  Patient and TRH MD.   Thank you for allowing the Palliative Medicine Team to assist in the care of this patient.   Time In: 10 Time Out: 10.25 Total Time 25 Prolonged Time Billed No       Greater than 50%  of this time was spent counseling and coordinating care related to the above assessment and plan.  Loistine Chance, MD  Please contact Palliative Medicine Team phone at [phone] for questions and concerns.

## 2020-01-21 LAB — URINE CULTURE: Culture: >=100000 — AB

## 2020-01-22 DIAGNOSIS — C61 Malignant neoplasm of prostate: Secondary | ICD-10-CM | POA: Diagnosis not present

## 2020-01-23 ENCOUNTER — Inpatient Hospital Stay: Payer: Medicare Other

## 2020-01-23 ENCOUNTER — Other Ambulatory Visit: Payer: Self-pay

## 2020-01-23 DIAGNOSIS — Z923 Personal history of irradiation: Secondary | ICD-10-CM | POA: Insufficient documentation

## 2020-01-23 DIAGNOSIS — Z5111 Encounter for antineoplastic chemotherapy: Secondary | ICD-10-CM | POA: Insufficient documentation

## 2020-01-23 DIAGNOSIS — C7651 Malignant neoplasm of right lower limb: Secondary | ICD-10-CM | POA: Insufficient documentation

## 2020-01-23 DIAGNOSIS — Z23 Encounter for immunization: Secondary | ICD-10-CM | POA: Diagnosis not present

## 2020-01-23 DIAGNOSIS — C61 Malignant neoplasm of prostate: Secondary | ICD-10-CM | POA: Diagnosis not present

## 2020-01-23 DIAGNOSIS — M549 Dorsalgia, unspecified: Secondary | ICD-10-CM | POA: Insufficient documentation

## 2020-01-23 DIAGNOSIS — Z79818 Long term (current) use of other agents affecting estrogen receptors and estrogen levels: Secondary | ICD-10-CM | POA: Diagnosis not present

## 2020-01-23 DIAGNOSIS — Z79899 Other long term (current) drug therapy: Secondary | ICD-10-CM | POA: Diagnosis not present

## 2020-01-23 DIAGNOSIS — Z5189 Encounter for other specified aftercare: Secondary | ICD-10-CM | POA: Diagnosis not present

## 2020-01-23 DIAGNOSIS — R531 Weakness: Secondary | ICD-10-CM | POA: Insufficient documentation

## 2020-01-23 LAB — CMP (CANCER CENTER ONLY)
ALT: 7 U/L (ref 0–44)
AST: 14 U/L — ABNORMAL LOW (ref 15–41)
Albumin: 3 g/dL — ABNORMAL LOW (ref 3.5–5.0)
Alkaline Phosphatase: 98 U/L (ref 38–126)
Anion gap: 5 (ref 5–15)
BUN: 12 mg/dL (ref 8–23)
CO2: 32 mmol/L (ref 22–32)
Calcium: 10.4 mg/dL — ABNORMAL HIGH (ref 8.9–10.3)
Chloride: 100 mmol/L (ref 98–111)
Creatinine: 1.39 mg/dL — ABNORMAL HIGH (ref 0.61–1.24)
GFR, Estimated: 51 mL/min — ABNORMAL LOW (ref >60)
Glucose, Bld: 116 mg/dL — ABNORMAL HIGH (ref 70–99)
Potassium: 4 mmol/L (ref 3.5–5.1)
Sodium: 137 mmol/L (ref 135–145)
Total Bilirubin: 0.3 mg/dL (ref 0.3–1.2)
Total Protein: 8.7 g/dL — ABNORMAL HIGH (ref 6.5–8.1)

## 2020-01-23 LAB — CBC WITH DIFFERENTIAL (CANCER CENTER ONLY)
Abs Immature Granulocytes: 0.07 10*3/uL (ref 0.00–0.07)
Basophils Absolute: 0 10*3/uL (ref 0.0–0.1)
Basophils Relative: 1 %
Eosinophils Absolute: 0.1 10*3/uL (ref 0.0–0.5)
Eosinophils Relative: 2 %
HCT: 35.9 % — ABNORMAL LOW (ref 39.0–52.0)
Hemoglobin: 11.9 g/dL — ABNORMAL LOW (ref 13.0–17.0)
Immature Granulocytes: 1 %
Lymphocytes Relative: 22 %
Lymphs Abs: 1.4 10*3/uL (ref 0.7–4.0)
MCH: 30.7 pg (ref 26.0–34.0)
MCHC: 33.1 g/dL (ref 30.0–36.0)
MCV: 92.8 fL (ref 80.0–100.0)
Monocytes Absolute: 0.6 10*3/uL (ref 0.1–1.0)
Monocytes Relative: 10 %
Neutro Abs: 4.1 10*3/uL (ref 1.7–7.7)
Neutrophils Relative %: 64 %
Platelet Count: 398 10*3/uL (ref 150–400)
RBC: 3.87 MIL/uL — ABNORMAL LOW (ref 4.22–5.81)
RDW: 11.7 % (ref 11.5–15.5)
WBC Count: 6.3 10*3/uL (ref 4.0–10.5)
nRBC: 0 % (ref 0.0–0.2)

## 2020-01-23 NOTE — Progress Notes (Signed)
Pharmacist Chemotherapy Monitoring - Initial Assessment    Anticipated start date: 01/24/20   Regimen:  . Are orders appropriate based on the patient's diagnosis, regimen, and cycle? Yes . Does the plan date match the patient's scheduled date? Yes . Is the sequencing of drugs appropriate? Yes . Are the premedications appropriate for the patient's regimen? Yes . Prior Authorization for treatment is: Approved o If applicable, is the correct biosimilar selected based on the patient's insurance? not applicable  Organ Function and Labs: Marland Kitchen Are dose adjustments needed based on the patient's renal function, hepatic function, or hematologic function? No . Are appropriate labs ordered prior to the start of patient's treatment? Yes . Other organ system assessment, if indicated: N/A . The following baseline labs, if indicated, have been ordered: N/A  Dose Assessment: . Are the drug doses appropriate? Yes . Are the following correct: o Drug concentrations Yes o IV fluid compatible with drug Yes o Administration routes Yes o Timing of therapy Yes . If applicable, does the patient have documented access for treatment and/or plans for port-a-cath placement? no . If applicable, have lifetime cumulative doses been properly documented and assessed? not applicable Lifetime Dose Tracking  No doses have been documented on this patient for the following tracked chemicals: Doxorubicin, Epirubicin, Idarubicin, Daunorubicin, Mitoxantrone, Bleomycin, Oxaliplatin, Carboplatin, Liposomal Doxorubicin  o   Toxicity Monitoring/Prevention: . The patient has the following take home antiemetics prescribed: Ondansetron and Prochlorperazine . The patient has the following take home medications prescribed: N/A . Medication allergies and previous infusion related reactions, if applicable, have been reviewed and addressed. Yes . The patient's current medication list has been assessed for drug-drug interactions with their  chemotherapy regimen. no significant drug-drug interactions were identified on review.  Order Review: . Are the treatment plan orders signed? Yes . Is the patient scheduled to see a provider prior to their treatment? Yes  I verify that I have reviewed each item in the above checklist and answered each question accordingly.  Romualdo Bolk [facility] 01/23/2020 12:13 PM

## 2020-01-24 ENCOUNTER — Other Ambulatory Visit: Payer: Medicare Other

## 2020-01-24 ENCOUNTER — Other Ambulatory Visit: Payer: Self-pay

## 2020-01-24 ENCOUNTER — Inpatient Hospital Stay (HOSPITAL_BASED_OUTPATIENT_CLINIC_OR_DEPARTMENT_OTHER): Payer: Medicare Other | Admitting: Oncology

## 2020-01-24 ENCOUNTER — Telehealth: Payer: Self-pay

## 2020-01-24 ENCOUNTER — Inpatient Hospital Stay: Payer: Medicare Other

## 2020-01-24 ENCOUNTER — Encounter: Payer: Self-pay | Admitting: Oncology

## 2020-01-24 VITALS — BP 152/82 | HR 60 | Temp 97.9°F | Resp 18 | Ht 68.0 in | Wt 203.5 lb

## 2020-01-24 DIAGNOSIS — Z5111 Encounter for antineoplastic chemotherapy: Secondary | ICD-10-CM | POA: Diagnosis not present

## 2020-01-24 DIAGNOSIS — C61 Malignant neoplasm of prostate: Secondary | ICD-10-CM

## 2020-01-24 DIAGNOSIS — Z23 Encounter for immunization: Secondary | ICD-10-CM

## 2020-01-24 LAB — PROSTATE-SPECIFIC AG, SERUM (LABCORP): Prostate Specific Ag, Serum: 61.9 ng/mL — ABNORMAL HIGH (ref 0.0–4.0)

## 2020-01-24 MED ORDER — SODIUM CHLORIDE 0.9 % IV SOLN
10.0000 mg | Freq: Once | INTRAVENOUS | Status: AC
  Administered 2020-01-24: 10 mg via INTRAVENOUS
  Filled 2020-01-24: qty 10

## 2020-01-24 MED ORDER — INFLUENZA VAC A&B SA ADJ QUAD 0.5 ML IM PRSY
PREFILLED_SYRINGE | INTRAMUSCULAR | Status: AC
  Filled 2020-01-24: qty 0.5

## 2020-01-24 MED ORDER — INFLUENZA VAC A&B SA ADJ QUAD 0.5 ML IM PRSY
0.5000 mL | PREFILLED_SYRINGE | Freq: Once | INTRAMUSCULAR | Status: AC
  Administered 2020-01-24: 0.5 mL via INTRAMUSCULAR

## 2020-01-24 MED ORDER — SODIUM CHLORIDE 0.9 % IV SOLN
Freq: Once | INTRAVENOUS | Status: AC
  Filled 2020-01-24: qty 250

## 2020-01-24 MED ORDER — SODIUM CHLORIDE 0.9 % IV SOLN
75.0000 mg/m2 | Freq: Once | INTRAVENOUS | Status: AC
  Administered 2020-01-24: 160 mg via INTRAVENOUS
  Filled 2020-01-24: qty 16

## 2020-01-24 NOTE — Patient Instructions (Signed)
[facility] Discharge Instructions for Patients Receiving Chemotherapy  Today you received the following chemotherapy agents: Docetaxel (Taxotere).  To help prevent nausea and vomiting after your treatment, we encourage you to take your nausea medication as prescribed.  If you develop nausea and vomiting that is not controlled by your nausea medication, call the clinic.   BELOW ARE SYMPTOMS THAT SHOULD BE REPORTED IMMEDIATELY:  *FEVER GREATER THAN 100.5 F  *CHILLS WITH OR WITHOUT FEVER  NAUSEA AND VOMITING THAT IS NOT CONTROLLED WITH YOUR NAUSEA MEDICATION  *UNUSUAL SHORTNESS OF BREATH  *UNUSUAL BRUISING OR BLEEDING  TENDERNESS IN MOUTH AND THROAT WITH OR WITHOUT PRESENCE OF ULCERS  *URINARY PROBLEMS  *BOWEL PROBLEMS  UNUSUAL RASH Items with * indicate a potential emergency and should be followed up as soon as possible.  Feel free to call the clinic should you have any questions or concerns. The clinic phone number is [phone].  Please show the CHEMO ALERT CARD at check-in to the Emergency Department and triage nurse.   

## 2020-01-24 NOTE — Progress Notes (Signed)
Met with patient at registration to introduce myself as Financial Resource Specialist and to offer available resources.  Discussed one-time $1000 [employer] grant and qualifications to assist with personal expenses while going through treatment.  Gave him my card if interested in applying and for any additional financial questions or concerns.  

## 2020-01-24 NOTE — Telephone Encounter (Signed)
Scheduled patient to have port place on 01/31/20 per Dr [name].Wrote patient appointment information in infusion. Explained to patient about not eating after 8am and someone needs to drive him to his appointment. Advised patient he needs to be at [place] on 01/31/20 at 1pm. Patient verbalized understanding.

## 2020-01-24 NOTE — Progress Notes (Signed)
Hematology and Oncology Follow Up Visit  ARTIE MCINTYRE [id number] 1950/10/17 69 y.o. 01/24/2020 1:52 PM [name], DOWelborn, [name], DO   Principle Diagnosis: 69 year old man with castration-resistant prostate cancer with disease diagnosed in December 2020. A He presented with Gleason score 4+5 = 9 and a PSA of 366 and advanced disease to the bone.  Prior Therapy: He is status post prostate biopsy completed on December 3 of 2020.  Pathology showed a Gleason score 4+5 = 9 with 12 out of 12 cores.  He is status post both of radiation therapy to the spine between T9 and L1.  He received 30 Gray in 10 fractions between January 15 and 28, 2021.   Firmagon 240 mg started on May 31, 2019.  Zytiga 1000 mg daily with prednisone 5 mg daily started in May 2021. Therapy discontinued in October of 2021 due to progression of disease.   Current therapy:   Eligard every 4 months with next injection scheduled for March 10, 2020.  Taxotere chemotherapy with 75 mg per metered squared every 3 weeks. Cycle one will start on January 24, 2020.  Interim History: Ms. Lal returns today for a follow-up visit. Since her last visit, he was hospitalized for intractable pain and required intravenous pain medication which controlled his pain and was discharged on long-acting morphine. Since his discharge, he reports improvement in his pain level with the current morphine and Dilaudid dosage.  He is ambulating short distances without any falls or syncope.   Medications: Unchanged on review. Current Outpatient Medications  Medication Sig Dispense Refill  . abiraterone acetate (ZYTIGA) 250 MG tablet Take 4 tablets (1,000 mg total) by mouth daily. Take on an empty stomach 1 hour before or 2 hours after a meal 120 tablet 0  . docusate sodium (COLACE) 100 MG capsule Take 200 mg by mouth daily.     . Ferrous Sulfate (IRON PO) Take 1 tablet by mouth daily.    Marland Kitchen HYDROmorphone (DILAUDID) 4 MG tablet Take 1 tablet  (4 mg total) by mouth every 4 (four) hours as needed for severe pain. 60 tablet 0  . lidocaine-prilocaine (EMLA) cream Apply 1 application topically as needed. 30 g 0  . morphine (MS CONTIN) 30 MG 12 hr tablet Take 1 tablet (30 mg total) by mouth every 8 (eight) hours. 90 tablet 0  . Multiple Vitamin (MULTIVITAMIN WITH MINERALS) TABS tablet Take 1 tablet by mouth daily.    . ondansetron (ZOFRAN) 4 MG tablet TAKE 1 TABLET (4 MG TOTAL) BY MOUTH 3 (THREE) TIMES DAILY. (Patient taking differently: Take 4 mg by mouth 3 (three) times daily as needed for nausea or vomiting. ) 20 tablet 0  . oxybutynin (DITROPAN) 5 MG tablet Take 5 mg by mouth 3 (three) times daily as needed for bladder spasms.    . Plecanatide (TRULANCE) 3 MG TABS Take 3 mg by mouth daily. (Patient taking differently: Take 3 mg by mouth daily as needed (when taking miralax). ) 30 tablet 2  . polyethylene glycol (MIRALAX) 17 g packet Take 17 g by mouth 2 (two) times daily. 60 each 0  . predniSONE (DELTASONE) 5 MG tablet Take 1 tablet (5 mg total) by mouth daily with breakfast. 90 tablet 3  . prochlorperazine (COMPAZINE) 10 MG tablet Take 1 tablet (10 mg total) by mouth every 6 (six) hours as needed for nausea or vomiting. 30 tablet 0  . Pseudoephedrine HCl, Deter, 30 MG TABA Take 30 mg by mouth 4 (four) times daily as  needed for congestion.     . senna (SENOKOT) 8.6 MG TABS tablet Take 2 tablets by mouth daily.     . tamsulosin (FLOMAX) 0.4 MG CAPS capsule Take 1 capsule (0.4 mg total) by mouth daily after supper. (Patient taking differently: Take 0.4 mg by mouth daily as needed (to help with urination). ) 30 capsule 3   No current facility-administered medications for this visit.     Allergies:  Allergies  Allergen Reactions  . Ibuprofen Anaphylaxis  . Shrimp [Shellfish Allergy] Anaphylaxis       Physical Exam:           ECOG: 1    General appearance: Alert, awake without any distress. Head: Atraumatic without  abnormalities Oropharynx: Without any thrush or ulcers. Eyes: No scleral icterus. Lymph nodes: No lymphadenopathy noted in the cervical, supraclavicular, or axillary nodes Heart:regular rate and rhythm, without any murmurs or gallops.   Lung: Clear to auscultation without any rhonchi, wheezes or dullness to percussion. Abdomin: Soft, nontender without any shifting dullness or ascites. Musculoskeletal: No clubbing or cyanosis. Neurological: No motor or sensory deficits. Skin: No rashes or lesions.             Lab Results: Lab Results  Component Value Date   WBC 6.3 01/23/2020   HGB 11.9 (L) 01/23/2020   HCT 35.9 (L) 01/23/2020   MCV 92.8 01/23/2020   PLT 398 01/23/2020     Chemistry      Component Value Date/Time   NA 137 01/23/2020 1110   K 4.0 01/23/2020 1110   CL 100 01/23/2020 1110   CO2 32 01/23/2020 1110   BUN 12 01/23/2020 1110   CREATININE 1.39 (H) 01/23/2020 1110      Component Value Date/Time   CALCIUM 10.4 (H) 01/23/2020 1110   ALKPHOS 98 01/23/2020 1110   AST 14 (L) 01/23/2020 1110   ALT 7 01/23/2020 1110   BILITOT 0.3 01/23/2020 1110       Results for Garciamartinez, Luisfelipe R (MRN [id number]) as of 01/24/2020 13:57  Ref. Range 11/29/2019 13:06 01/12/2020 08:52 01/23/2020 11:10  Prostate Specific Ag, Serum Latest Ref Range: 0.0 - 4.0 ng/mL 10.3 (H) 40.3 (H) 61.9 (H)    IMPRESSION: 1. Stable appearance of multifocal osseous metastatic disease as described. 2. Post radiation changes in the thoracic spine to L1. 3. Multilevel disc disease leads to foraminal narrowing as described. 4. Moderate left foraminal stenosis at L2-3. 5. Moderate foraminal narrowing bilaterally at L3-4 is worse left than right. 6. Moderate foraminal narrowing bilaterally at L4-5 is stable, right greater than left. 7. Foraminal narrowing bilaterally at L5-S1.   Impression and Plan:   69 year old man with:  1.   Castration-resistant prostate cancer with disease to the bone  and lymphadenopathy diagnosed in December 2020. He developed a rapid progression of his disease despite being on androgen deprivation and Zytiga.   His disease status was updated today and risks and benefits of proceeding with Taxotere chemotherapy were discussed. Complications that include nausea, vomiting, myelosuppression, neutropenia and possible sepsis were reiterated. He is agreeable to proceed we'll continue to assess his response with PSA criteria.  He is agreeable to proceed at this time.      2.  Androgen deprivation therapy: This will be continued every 4 months. Next Eligard will be given December 2021. Uppercase clinic hot flashes or weight gain were reviewed.  3.  Bone directed therapy: This was deferred till he obtains dental clearance I recommended continue calcium and vitamin D  supplements.  4.   Intractable pain: He is currently on morphine and Dilaudid with pain under reasonable control.  We will continue this regimen.  5. Growth factor support: He will require growth factor support after each chemotherapy infusion. He is risk of neutropenia is high.  6.  IV access: Peripheral veins are currently in use I recommended proceeding with a Port-A-Cath insertion.  Risks and benefits associated with this procedure including bleeding, thrombosis and infection were reiterated.  7.  Follow-up: In 3 weeks for the next cycle of therapy.    30  minutes were spent on this encounter. Time was dedicated to reviewing his disease status, discussing treatment options and complications of therapy.  Zola Button, MD 10/20/20211:52 PM

## 2020-01-25 ENCOUNTER — Telehealth: Payer: Self-pay

## 2020-01-25 ENCOUNTER — Telehealth: Payer: Self-pay | Admitting: Emergency Medicine

## 2020-01-25 DIAGNOSIS — C61 Malignant neoplasm of prostate: Secondary | ICD-10-CM | POA: Diagnosis not present

## 2020-01-25 NOTE — Telephone Encounter (Signed)
-----   Message from [name], MD sent at 01/25/2020  2:54 PM EDT ----- No. Zytiga needs to be stopped. Thanks ----- Message ----- From: Kennedy Bucker, LPN Sent: [id number]/[id number]   2:49 PM EDT To: [name], MD  Patient called asking if he is still supposed to be taking his Zytiga. Thank you  Maudie Mercury LPN

## 2020-01-25 NOTE — Telephone Encounter (Signed)
Attempted chemo f/u call, no answer.  Left VM with call back information if patient has any questions/concerns after recent infusion.

## 2020-01-25 NOTE — Telephone Encounter (Signed)
Returned patient's call per Dr [name]. Patient verbalized understanding.

## 2020-01-25 NOTE — Telephone Encounter (Signed)
-----   Message from Jolaine Click, RN sent at 01/24/2020  3:56 PM EDT ----- Regarding: First Time Docetaxel (Taxotere) - Dr. [name] Patient First Time Docetaxel (Taxotere) - Dr. [name] Patient

## 2020-01-26 ENCOUNTER — Other Ambulatory Visit: Payer: Self-pay

## 2020-01-26 ENCOUNTER — Inpatient Hospital Stay: Payer: Medicare Other

## 2020-01-26 VITALS — BP 155/68 | HR 73

## 2020-01-26 DIAGNOSIS — C61 Malignant neoplasm of prostate: Secondary | ICD-10-CM | POA: Diagnosis not present

## 2020-01-26 DIAGNOSIS — Z5111 Encounter for antineoplastic chemotherapy: Secondary | ICD-10-CM | POA: Diagnosis not present

## 2020-01-26 MED ORDER — PEGFILGRASTIM-JMDB 6 MG/0.6ML ~~LOC~~ SOSY
6.0000 mg | PREFILLED_SYRINGE | Freq: Once | SUBCUTANEOUS | Status: AC
  Administered 2020-01-26: 6 mg via SUBCUTANEOUS

## 2020-01-26 MED ORDER — LEUPROLIDE ACETATE (4 MONTH) 30 MG ~~LOC~~ KIT
PACK | SUBCUTANEOUS | Status: AC
  Filled 2020-01-26: qty 30

## 2020-01-26 MED ORDER — PEGFILGRASTIM-JMDB 6 MG/0.6ML ~~LOC~~ SOSY
PREFILLED_SYRINGE | SUBCUTANEOUS | Status: AC
  Filled 2020-01-26: qty 0.6

## 2020-01-26 NOTE — Patient Instructions (Signed)

## 2020-01-27 DIAGNOSIS — C61 Malignant neoplasm of prostate: Secondary | ICD-10-CM | POA: Diagnosis not present

## 2020-01-28 DIAGNOSIS — C61 Malignant neoplasm of prostate: Secondary | ICD-10-CM | POA: Diagnosis not present

## 2020-01-29 ENCOUNTER — Telehealth: Payer: Self-pay | Admitting: Family Medicine

## 2020-01-29 DIAGNOSIS — C61 Malignant neoplasm of prostate: Secondary | ICD-10-CM | POA: Diagnosis not present

## 2020-01-29 NOTE — Telephone Encounter (Signed)
   OVIDE DUSEK DOB: [birth year] MRN: [id number]   RIDER WAIVER AND RELEASE OF LIABILITY  For purposes of improving physical access to our facilities, [facility] is pleased to partner with third parties to provide [facility] patients or other authorized individuals the option of convenient, on-demand ground transportation services (the [place]") through use of the technology service that enables users to request on-demand ground transportation from independent third-party providers.  By opting to use and accept these [employer], I, the undersigned, hereby agree on behalf of myself, and on behalf of any minor child using the [employer] for whom I am the parent or legal guardian, as follows:  1. Government social research officer provided to me are provided by independent third-party transportation providers who are not [employer] or employees and who are unaffiliated with [employer]. 2. [facility] is neither a transportation carrier nor a common or public carrier. 3. [employer] has no control over the quality or safety of the transportation that occurs as a result of the [employer]. 4. [facility] cannot guarantee that any third-party transportation provider will complete any arranged transportation service. 5. [facility] makes no representation, warranty, or guarantee regarding the reliability, timeliness, quality, safety, suitability, or availability of any of the Transport Services or that they will be error free. 6. I fully understand that traveling by vehicle involves risks and dangers of serious bodily injury, including permanent disability, paralysis, and death. I agree, on behalf of myself and on behalf of any minor child using the Transport Services for whom I am the parent or legal guardian, that the entire risk arising out of my use of the [employer] remains solely with me, to the maximum extent permitted under applicable law. 7. The [employer]  are provided "as is" and "as available." [facility] disclaims all representations and warranties, express, implied or statutory, not expressly set out in these terms, including the implied warranties of merchantability and fitness for a particular purpose. 8. I hereby waive and release [facility], its agents, employees, officers, directors, representatives, insurers, attorneys, assigns, successors, subsidiaries, and affiliates from any and all past, present, or future claims, demands, liabilities, actions, causes of action, or suits of any kind directly or indirectly arising from acceptance and use of the [employer]. 9. I further waive and release [facility] and its affiliates from all present and future liability and responsibility for any injury or death to persons or damages to property caused by or related to the use of the [employer]. 10. I have read this Waiver and Release of Liability, and I understand the terms used in it and their legal significance. This Waiver is freely and voluntarily given with the understanding that my right (as well as the right of any minor child for whom I am the parent or legal guardian using the [employer]) to legal recourse against [facility] in connection with the [employer] is knowingly surrendered in return for use of these services.   I attest that I read the consent document to Cathrine Muster, gave Mr. [place] the opportunity to ask questions and answered the questions asked (if any). I affirm that Cathrine Muster then provided consent for he's participation in this program.      Darrick Meigs [place]

## 2020-01-30 ENCOUNTER — Other Ambulatory Visit: Payer: Self-pay | Admitting: Physician Assistant

## 2020-01-30 DIAGNOSIS — C61 Malignant neoplasm of prostate: Secondary | ICD-10-CM | POA: Diagnosis not present

## 2020-01-30 MED ORDER — DEXTROSE 5 % IV SOLN
2.0000 g | Freq: Once | INTRAVENOUS | Status: DC
Start: 2020-01-30 — End: 2020-01-30

## 2020-01-31 ENCOUNTER — Ambulatory Visit (HOSPITAL_COMMUNITY)
Admission: RE | Admit: 2020-01-31 | Discharge: 2020-01-31 | Disposition: A | Discharge status: Home / Self Care | Payer: Medicare Other | Source: Ambulatory Visit | Attending: Oncology | Admitting: Oncology

## 2020-01-31 ENCOUNTER — Encounter (HOSPITAL_COMMUNITY): Payer: Self-pay

## 2020-01-31 ENCOUNTER — Other Ambulatory Visit: Payer: Self-pay | Admitting: Oncology

## 2020-01-31 ENCOUNTER — Other Ambulatory Visit: Payer: Self-pay

## 2020-01-31 DIAGNOSIS — Z886 Allergy status to analgesic agent status: Secondary | ICD-10-CM | POA: Diagnosis not present

## 2020-01-31 DIAGNOSIS — Z452 Encounter for adjustment and management of vascular access device: Secondary | ICD-10-CM | POA: Diagnosis not present

## 2020-01-31 DIAGNOSIS — C61 Malignant neoplasm of prostate: Secondary | ICD-10-CM

## 2020-01-31 DIAGNOSIS — C7951 Secondary malignant neoplasm of bone: Secondary | ICD-10-CM | POA: Insufficient documentation

## 2020-01-31 DIAGNOSIS — Z91013 Allergy to seafood: Secondary | ICD-10-CM | POA: Diagnosis not present

## 2020-01-31 HISTORY — PX: IR IMAGING GUIDED PORT INSERTION: IMG5740

## 2020-01-31 MED ORDER — MIDAZOLAM HCL 2 MG/2ML IJ SOLN
INTRAMUSCULAR | Status: AC
  Filled 2020-01-31: qty 4

## 2020-01-31 MED ORDER — MIDAZOLAM HCL 2 MG/2ML IJ SOLN
INTRAMUSCULAR | Status: AC | PRN
  Administered 2020-01-31 (×2): 1 mg via INTRAVENOUS

## 2020-01-31 MED ORDER — CEFAZOLIN SODIUM-DEXTROSE 2-4 GM/100ML-% IV SOLN: 2.0000 g | INTRAVENOUS | Status: AC

## 2020-01-31 MED ORDER — HEPARIN SOD (PORK) LOCK FLUSH 100 UNIT/ML IV SOLN
INTRAVENOUS | Status: AC | PRN
  Administered 2020-01-31: 500 [IU] via INTRAVENOUS

## 2020-01-31 MED ORDER — FENTANYL CITRATE (PF) 100 MCG/2ML IJ SOLN
INTRAMUSCULAR | Status: AC
  Filled 2020-01-31: qty 2

## 2020-01-31 MED ORDER — LIDOCAINE-EPINEPHRINE 1 %-1:100000 IJ SOLN
INTRAMUSCULAR | Status: AC | PRN
  Administered 2020-01-31 (×2): 10 mL via INTRADERMAL

## 2020-01-31 MED ORDER — FENTANYL CITRATE (PF) 100 MCG/2ML IJ SOLN
INTRAMUSCULAR | Status: AC | PRN
Start: 2020-01-31 — End: 2020-01-31
  Administered 2020-01-31 (×2): 50 ug via INTRAVENOUS

## 2020-01-31 MED ORDER — LIDOCAINE-EPINEPHRINE 1 %-1:100000 IJ SOLN
INTRAMUSCULAR | Status: AC
  Filled 2020-01-31: qty 1

## 2020-01-31 MED ORDER — SODIUM CHLORIDE 0.9 % IV SOLN: INTRAVENOUS | Status: DC

## 2020-01-31 MED ORDER — CEFAZOLIN SODIUM-DEXTROSE 2-4 GM/100ML-% IV SOLN
INTRAVENOUS | Status: AC
  Administered 2020-01-31: 2 g via INTRAVENOUS
  Filled 2020-01-31: qty 100

## 2020-01-31 MED ORDER — HEPARIN SOD (PORK) LOCK FLUSH 100 UNIT/ML IV SOLN
INTRAVENOUS | Status: AC
  Filled 2020-01-31: qty 5

## 2020-01-31 NOTE — H&P (Signed)
Chief Complaint: Patient was seen in consultation today for port-a-catheter placement  Referring Physician(s): [name]  Supervising Physician: Jacqulynn Cadet  Patient Status: [facility] - Out-pt  History of Present Illness: Andre Glass is a 69 y.o. male with a medical history significant for prostate cancer with metastasis to the spine, diagnosed 03/2019. He developed rapid disease progression despite being on androgen deprivation therapy. He has been receiving radiation and chemotherapy via peripheral veins.  Interventional Radiology has been asked to evaluate this patient for an image-guided port-a-catheter placement to better facilitate his treatment plans.   Past Medical History:  Diagnosis Date  . Anemia   . History of kidney stones   . Prostate cancer ([place])   . UTI (urinary tract infection)     Past Surgical History:  Procedure Laterality Date  . TRANSURETHRAL RESECTION OF PROSTATE N/A 11/08/2019   Procedure: TRANSURETHRAL RESECTION OF THE PROSTATE (TURP);  Surgeon: [name], MD;  Location: [facility] ORS;  Service: Urology;  Laterality: N/A;    Allergies: Ibuprofen and Shrimp [shellfish allergy]  Medications: Prior to Admission medications   Medication Sig Start Date End Date Taking? Authorizing Provider  abiraterone acetate (ZYTIGA) 250 MG tablet Take 4 tablets (1,000 mg total) by mouth daily. Take on an empty stomach 1 hour before or 2 hours after a meal 12/25/19  Yes Shadad, Mathis Dad, MD  docusate sodium (COLACE) 100 MG capsule Take 200 mg by mouth daily.    Yes [provider]  Ferrous Sulfate (IRON PO) Take 1 tablet by mouth daily.   Yes [provider]  HYDROmorphone (DILAUDID) 4 MG tablet Take 1 tablet (4 mg total) by mouth every 4 (four) hours as needed for severe pain. 12/06/19  Yes [name], MD  morphine (MS CONTIN) 30 MG 12 hr tablet Take 1 tablet (30 mg total) by mouth every 8 (eight) hours. 01/16/20  Yes [name], MD    Multiple Vitamin (MULTIVITAMIN WITH MINERALS) TABS tablet Take 1 tablet by mouth daily.   Yes [provider]  ondansetron (ZOFRAN) 4 MG tablet TAKE 1 TABLET (4 MG TOTAL) BY MOUTH 3 (THREE) TIMES DAILY. Patient taking differently: Take 4 mg by mouth 3 (three) times daily as needed for nausea or vomiting.  10/31/19  Yes [name], MD  oxybutynin (DITROPAN) 5 MG tablet Take 5 mg by mouth 3 (three) times daily as needed for bladder spasms. 10/28/19  Yes [provider]  Plecanatide (TRULANCE) 3 MG TABS Take 3 mg by mouth daily. Patient taking differently: Take 3 mg by mouth daily as needed (when taking miralax).  10/27/19  Yes [name], [name], DO  polyethylene glycol (MIRALAX) 17 g packet Take 17 g by mouth 2 (two) times daily. 01/19/20  Yes Hongalgi, Lenis Dickinson, MD  predniSONE (DELTASONE) 5 MG tablet Take 1 tablet (5 mg total) by mouth daily with breakfast. 10/25/19  Yes Shadad, Mathis Dad, MD  prochlorperazine (COMPAZINE) 10 MG tablet Take 1 tablet (10 mg total) by mouth every 6 (six) hours as needed for nausea or vomiting. 01/15/20  Yes [name], MD  Pseudoephedrine HCl, Deter, 30 MG TABA Take 30 mg by mouth 4 (four) times daily as needed for congestion.    Yes [provider]  senna (SENOKOT) 8.6 MG TABS tablet Take 2 tablets by mouth daily.    Yes [provider]  tamsulosin (FLOMAX) 0.4 MG CAPS capsule Take 1 capsule (0.4 mg total) by mouth daily after  supper. Patient taking differently: Take 0.4 mg by mouth daily as needed (to help with urination).  10/27/19  Yes [name], [name], DO  lidocaine-prilocaine (EMLA) cream Apply 1 application topically as needed. 01/15/20   [name], MD     Family History  Problem Relation Age of Onset  . Stomach cancer Mother   . Pancreatic cancer Sister   . Breast cancer Neg Hx   . Prostate cancer Neg Hx   . Colon cancer Neg Hx     Social History   Socioeconomic History  . Marital status: Single    Spouse name:  Not on file  . Number of children: 0  . Years of education: Not on file  . Highest education level: Not on file  Occupational History  . Not on file  Tobacco Use  . Smoking status: Never Smoker  . Smokeless tobacco: Never Used  Vaping Use  . Vaping Use: Never used  Substance and Sexual Activity  . Alcohol use: Never  . Drug use: Never  . Sexual activity: Not Currently  Other Topics Concern  . Not on file  Social History Narrative  . Not on file   Social Determinants of Health   Financial Resource Strain:   . Difficulty of Paying Living Expenses: Not on file  Food Insecurity:   . Worried About Charity fundraiser in the Last Year: Not on file  . Ran Out of Food in the Last Year: Not on file  Transportation Needs:   . Lack of Transportation (Medical): Not on file  . Lack of Transportation (Non-Medical): Not on file  Physical Activity:   . Days of Exercise per Week: Not on file  . Minutes of Exercise per Session: Not on file  Stress:   . Feeling of Stress : Not on file  Social Connections:   . Frequency of Communication with Friends and Family: Not on file  . Frequency of Social Gatherings with Friends and Family: Not on file  . Attends Religious Services: Not on file  . Active Member of Clubs or Organizations: Not on file  . Attends Archivist Meetings: Not on file  . Marital Status: Not on file    Review of Systems: A 12 point ROS discussed and pertinent positives are indicated in the HPI above.  All other systems are negative.  Review of Systems  Constitutional: Positive for fatigue. Negative for appetite change.  Respiratory: Negative for cough and shortness of breath.   Cardiovascular: Negative for chest pain and leg swelling.  Gastrointestinal: Positive for nausea. Negative for abdominal pain, diarrhea and vomiting.  Musculoskeletal: Positive for back pain.  Neurological: Positive for numbness and headaches. Negative for dizziness and facial asymmetry.        Bilateral feet    Vital Signs: BP (!) 166/86 (BP Location: Right Arm)   Pulse 74   Temp 97.9 F (36.6 C) (Oral)   Resp 17   SpO2 98%   Physical Exam Constitutional:      General: He is not in acute distress. HENT:     Mouth/Throat:     Mouth: Mucous membranes are moist.     Pharynx: Oropharynx is clear.  Cardiovascular:     Rate and Rhythm: Normal rate and regular rhythm.     Pulses: Normal pulses.     Heart sounds: Normal heart sounds.  Pulmonary:     Effort: Pulmonary effort is normal.     Breath sounds: Normal breath sounds.  Abdominal:  General: Bowel sounds are normal.     Palpations: Abdomen is soft.  Skin:    General: Skin is warm and dry.  Neurological:     Mental Status: He is alert and oriented to person, place, and time.     Imaging: MR Lumbar Spine W Wo Contrast (assess for abscess, cord compression)  Result Date: 01/12/2020 CLINICAL DATA:  Severe low back pain. Personal history of prostate cancer. EXAM: MRI LUMBAR SPINE WITHOUT AND WITH CONTRAST TECHNIQUE: Multiplanar and multiecho pulse sequences of the lumbar spine were obtained without and with intravenous contrast. CONTRAST:  9.32mL GADAVIST GADOBUTROL 1 MMOL/ML IV SOLN COMPARISON:  MRI lumbar spine without and with contrast 05/29/2019 FINDINGS: Segmentation: 5 non rib-bearing lumbar type vertebral bodies are present. The lowest fully formed vertebral body is L5. Alignment: No significant listhesis is present. Straightening of normal lumbar lordosis is stable. Mild curvature of the spine is convex to the right at L1. Vertebrae: A large heterogeneous metastasis is again noted at T12 with extension into the left pedicle. Post radiation changes are present to the level of L1. Left paramedian lesion at L1 is stable in size. Sclerotic lesions along the inferior endplate of L2 are stable. The largest posterior on right 17 mm. Lesions at L3 are stable the largest is posteriorly left 14 mm. Heterogeneous  enhancing lesion is again noted superiorly on the right at L4. 5 mm lesions at L5 are stable to decreased in size. Sacral lesions are stable. Conus medullaris and cauda equina: Conus extends to the L1 level. Conus and cauda equina appear normal. Paraspinal and other soft tissues: Right ureter is obstructed moderate right-sided hydronephrosis is present. Limited imaging the abdomen is otherwise unremarkable. Disc levels: L1-2: Negative. L2-3: Leftward disc protrusion is again noted. Moderate left foraminal stenosis is stable. L3-4: A broad-based disc protrusion is again noted, asymmetric to the left. Moderate foraminal narrowing is worse left than right, stable. L4-5: A broad-based disc protrusion is asymmetric to the right. Moderate foraminal narrowing is stable, right greater than left. L5-S1: A broad-based disc protrusion is present. Foraminal narrowing is present bilaterally. No significant change is present. IMPRESSION: 1. Stable appearance of multifocal osseous metastatic disease as described. 2. Post radiation changes in the thoracic spine to L1. 3. Multilevel disc disease leads to foraminal narrowing as described. 4. Moderate left foraminal stenosis at L2-3. 5. Moderate foraminal narrowing bilaterally at L3-4 is worse left than right. 6. Moderate foraminal narrowing bilaterally at L4-5 is stable, right greater than left. 7. Foraminal narrowing bilaterally at L5-S1. Electronically Signed   By: San Morelle M.D.   On: 01/12/2020 18:06   CT ABDOMEN PELVIS W CONTRAST  Result Date: 01/18/2020 CLINICAL DATA:  Left upper quadrant abdominal pain. History of metastatic prostate cancer. EXAM: CT ABDOMEN AND PELVIS WITH CONTRAST TECHNIQUE: Multidetector CT imaging of the abdomen and pelvis was performed using the standard protocol following bolus administration of intravenous contrast. CONTRAST:  151mL OMNIPAQUE IOHEXOL 300 MG/ML  SOLN COMPARISON:  January 12, 2020. FINDINGS: Lower chest: No acute  abnormality. Hepatobiliary: No focal liver abnormality is seen. No gallstones, gallbladder wall thickening, or biliary dilatation. Pancreas: Unremarkable. No pancreatic ductal dilatation or surrounding inflammatory changes. Spleen: Normal in size without focal abnormality. Adrenals/Urinary Tract: Adrenal glands appear normal. Stable bilateral hydroureteronephrosis is noted, right greater than left, presumably secondary to obstruction at the level of the ureterovesical junction bilaterally from tumor, which may represent malignancy extending from the prostate gland, although primary bladder malignancy cannot be excluded. No  renal or ureteral calculi are noted. Stomach/Bowel: Stomach is within normal limits. Appendix appears normal. No evidence of bowel wall thickening, distention, or inflammatory changes. Vascular/Lymphatic: Atherosclerosis of thoracic aorta is noted. Stable left periaortic adenopathy is noted measuring 22 x 11 mm. Smaller retroperitoneal lymph nodes are noted. 18 x 17 mm lymph node is noted to the right of the rectum concerning for metastatic disease. Reproductive: As noted above, prostate gland is mildly enlarged and appears to be extending into the inferior posterior aspect of the urinary bladder consistent with the history of malignancy. Other: No abdominal wall hernia or abnormality. No abdominopelvic ascites. Musculoskeletal: Multiple sclerotic osseous metastases are again noted in the visualized spine and pelvis. IMPRESSION: 1. Stable bilateral hydroureteronephrosis is noted, right greater than left, presumably secondary to obstruction at the level of the ureterovesical junction bilaterally from tumor, which may represent malignancy extending from the prostate gland, although primary bladder malignancy cannot be excluded. 2. Stable retroperitoneal adenopathy is noted concerning for metastatic disease. 3. Multiple sclerotic osseous metastases are again noted in the visualized spine and pelvis.  4. Aortic atherosclerosis. Aortic Atherosclerosis (ICD10-I70.0). Electronically Signed   By: Marijo Conception M.D.   On: 01/18/2020 09:42   CT L-SPINE NO CHARGE  Result Date: 01/18/2020 CLINICAL DATA:  Low back pain in a patient with a history of metastatic prostate cancer. No known injury. EXAM: CT LUMBAR SPINE WITHOUT CONTRAST TECHNIQUE: Multidetector CT imaging of the lumbar spine was performed without intravenous contrast administration. Multiplanar CT image reconstructions were also generated. COMPARISON:  CT abdomen and pelvis this same day and 01/12/2020. FINDINGS: Segmentation: Standard. Alignment: Maintained. Vertebrae: As on the 01/12/2020 exam, multiple sclerotic lesions are identified consistent with metastatic prostate cancer. Largest deposit is in T12 which is nearly completely replaced. There is no fracture or new lesion. Paraspinal and other soft tissues: See report of dedicated abdomen and pelvis CT today. Disc levels: T11-12: Mild facet degenerative disease. Tiny left paracentral protrusion. No stenosis. T12-L1: Mild-to-moderate facet arthropathy is more notable on the left. Minimal disc bulge. No stenosis. L1-2: Negative. L2-3: Negative. L3-4: Mild facet degenerative disease and a shallow disc bulge. The central canal and left foramen are open. Mild right foraminal narrowing. L4-5: Mild facet degenerative disease and a shallow disc bulge. The central canal is open. Moderate foraminal narrowing appears worse on the right. L5-S1: Shallow disc bulge and endplate spur. Mild facet arthropathy. The central canal is open. Moderate bilateral foraminal narrowing appears worse on the left. IMPRESSION: No acute abnormality. Sclerotic lesions consistent with metastatic disease are unchanged since the patient's 01/12/2020 exam. Spondylosis is most notable at L4-5 and L5-S1 where it results in moderate to bilateral foraminal narrowing. Electronically Signed   By: [name] M.D.   On: 01/18/2020 09:42    CT Renal Stone Study  Result Date: 01/12/2020 CLINICAL DATA:  69 year old male with flank pain. Concern for kidney stone. History of metastatic prostate cancer. EXAM: CT ABDOMEN AND PELVIS WITHOUT CONTRAST TECHNIQUE: Multidetector CT imaging of the abdomen and pelvis was performed following the standard protocol without IV contrast. COMPARISON:  CT abdomen pelvis dated 04/08/2019. FINDINGS: Lower chest: The visualized lung bases are clear. No intra-abdominal free air or free fluid. Hepatobiliary: No focal liver abnormality is seen. No gallstones, gallbladder wall thickening, or biliary dilatation. Pancreas: Unremarkable. No pancreatic ductal dilatation or surrounding inflammatory changes. Spleen: Normal in size without focal abnormality. Adrenals/Urinary Tract: The adrenal glands unremarkable. There is mild bilateral hydronephrosis. Retained excreted contrast within the renal  collecting system and ureters. The urinary bladder is partially distended. Nodular soft tissue protruding into the base of the bladder may be extension of known prostate cancer. A primary bladder neoplasm is not excluded. Clinical correlation and further evaluation with cystoscopy is recommended. Stomach/Bowel: There is moderate stool throughout the colon. There is no bowel obstruction or active inflammation. The appendix is normal. Vascular/Lymphatic: Mild aortoiliac atherosclerotic disease. The IVC is unremarkable. No portal venous gas. There is a 15 mm rounded lymph nodes to the right of the rectum (82/3). Additional mildly rounded favor static/perirectal nodes noted. Multiple mildly enlarged retroperitoneal and para-aortic lymph nodes have decreased in size since the prior CT. Reproductive: The prostate gland appears smaller compared to the prior CT. Other: None Musculoskeletal: Multiple osseous metastatic disease with more extensive involvement of T12 and T10. The T10 prostatic lesion appears larger compared to the prior CT.  Bilateral ischial tuberosity metastatic disease new or increased since the prior CT. Right iliac metastatic lesion, new since the prior CT. IMPRESSION: 1. Mild bilateral hydronephrosis, right greater left. Findings may be related to a degree of obstruction at the level of the bladder due to UVJ occlusion secondary to tumor invasion. 2. Nodular soft tissue protruding into the base of the bladder may be extension of known prostate cancer. A primary bladder neoplasm is not excluded. Clinical correlation and further evaluation with cystoscopy is recommended. 3. Interval decrease in the size of the retroperitoneal and para-aortic lymph nodes since the prior CT. 4. Right perirectal adenopathy. 5. Interval progression of osseous metastatic disease. 6. Aortic Atherosclerosis (ICD10-I70.0). Electronically Signed   By: Anner Crete M.D.   On: 01/12/2020 20:36    Labs:  CBC: Recent Labs    01/12/20 0852 01/18/20 0700 01/19/20 0540 01/23/20 1110  WBC 6.1 9.2 9.6 6.3  HGB 11.9* 11.8* 10.8* 11.9*  HCT 35.9* 36.6* 33.2* 35.9*  PLT 314 476* 407* 398    COAGS: Recent Labs    10/31/19 1139  INR 1.0    BMP: Recent Labs    09/19/19 1227 09/19/19 1227 09/24/19 2037 09/24/19 2037 10/31/19 1139 10/31/19 1139 11/29/19 1306 11/29/19 1306 01/12/20 0852 01/18/20 0700 01/19/20 0540 01/23/20 1110  NA 140   < > 142   < > 142   < > 142   < > 141 134* 138 137  K 3.8   < > 3.7   < > 3.6   < > 3.5   < > 3.7 3.8 4.4 4.0  CL 103   < > 105   < > 103   < > 106   < > 102 96* 101 100  CO2 29   < > 28   < > 32   < > 30   < > 33* 28 28 32  GLUCOSE 96   < > 97   < > 86   < > 121*   < > 119* 142* 101* 116*  BUN 17   < > 15   < > 15   < > 16   < > 11 20 16 12   CALCIUM 9.2   < > 9.1   < > 9.2   < > 10.1   < > 10.0 9.4 9.4 10.4*  CREATININE 1.05   < > 0.78   < > 0.82  --  1.35*   < > 1.00 1.21 1.31* 1.39*  GFRNONAA >60   < > >60   < > >60  --  53*   < > >  60 >60 55* 51*  GFRAA >60  --  >60  --  >60  --  >60  --    --   --   --   --    < > = values in this interval not displayed.    LIVER FUNCTION TESTS: Recent Labs    01/12/20 0852 01/18/20 0700 01/19/20 0540 01/23/20 1110  BILITOT 0.4 0.5 0.5 0.3  AST 14* 14* 12* 14*  ALT 14 10 9 7   ALKPHOS 89 89 81 98  PROT 8.7* 8.7* 8.1 8.7*  ALBUMIN 3.1* 3.2* 3.0* 3.0*    TUMOR MARKERS: No results for input(s): AFPTM, CEA, CA199, CHROMGRNA in the last 8760 hours.  Assessment and Plan:  Castration-resistant prostate cancer: Andre Glass. Andre Glass, 69 year old male, presents today to the [facility] department for an image-guided port-a-catheter placement.  Risks and benefits of an image-guided port-a-catheter placement were discussed with the patient including, but not limited to bleeding, infection, pneumothorax, or fibrin sheath development and need for additional procedures.  All of the patient's questions were answered, patient is agreeable to proceed.  Consent signed and in chart.  Thank you for this interesting consult.  I greatly enjoyed meeting Andre Glass and look forward to participating in their care.  A copy of this report was sent to the requesting provider on this date.  Electronically Signed: Soyla Dryer, AGACNP-BC [phone] 01/31/2020, 1:29 PM   I spent a total of  30 Minutes   in face to face in clinical consultation, greater than 50% of which was counseling/coordinating care for port-a-catheter placement.

## 2020-01-31 NOTE — Procedures (Signed)
  Procedure: R IJ Port catheter placement   EBL:   minimal Complications:  none immediate  See full dictation in [facility] PACS.  D. Tierrah Anastos MD Main # [phone] Pager  [phone] Mobile [phone]     

## 2020-01-31 NOTE — Discharge Instructions (Signed)
Implanted Port Insertion, Care After °This sheet gives you information about how to care for yourself after your procedure. Your health care provider may also give you more specific instructions. If you have problems or questions, contact your health care provider. °What can I expect after the procedure? °After the procedure, it is common to have: °· Discomfort at the port insertion site. °· Bruising on the skin over the port. This should improve over 3-4 days. °Follow these instructions at home: °Port care °· After your port is placed, you will get a manufacturer's information card. The card has information about your port. Keep this card with you at all times. °· Take care of the port as told by your health care provider. Ask your health care provider if you or a family member can get training for taking care of the port at home. A home health care nurse may also take care of the port. °· Make sure to remember what type of port you have. °Incision care ° °  ° °· Follow instructions from your health care provider about how to take care of your port insertion site. Make sure you: °? Wash your hands with soap and water before and after you change your bandage (dressing). If soap and water are not available, use hand sanitizer. °? Change your dressing as told by your health care provider. °? Leave stitches (sutures), skin glue, or adhesive strips in place. These skin closures may need to stay in place for 2 weeks or longer. If adhesive strip edges start to loosen and curl up, you may trim the loose edges. Do not remove adhesive strips completely unless your health care provider tells you to do that. °· Check your port insertion site every day for signs of infection. Check for: °? Redness, swelling, or pain. °? Fluid or blood. °? Warmth. °? Pus or a bad smell. °Activity °· Return to your normal activities as told by your health care provider. Ask your health care provider what activities are safe for you. °· Do not  lift anything that is heavier than 10 lb (4.5 kg), or the limit that you are told, until your health care provider says that it is safe. °General instructions °· Take over-the-counter and prescription medicines only as told by your health care provider. °· Do not take baths, swim, or use a hot tub until your health care provider approves. Ask your health care provider if you may take showers. You may only be allowed to take sponge baths. °· Do not drive for 24 hours if you were given a sedative during your procedure. °· Wear a medical alert bracelet in case of an emergency. This will tell any health care providers that you have a port. °· Keep all follow-up visits as told by your health care provider. This is important. °Contact a health care provider if: °· You cannot flush your port with saline as directed, or you cannot draw blood from the port. °· You have a fever or chills. °· You have redness, swelling, or pain around your port insertion site. °· You have fluid or blood coming from your port insertion site. °· Your port insertion site feels warm to the touch. °· You have pus or a bad smell coming from the port insertion site. °Get help right away if: °· You have chest pain or shortness of breath. °· You have bleeding from your port that you cannot control. °Summary °· Take care of the port as told by your health   care provider. Keep the manufacturer's information card with you at all times. °· Change your dressing as told by your health care provider. °· Contact a health care provider if you have a fever or chills or if you have redness, swelling, or pain around your port insertion site. °· Keep all follow-up visits as told by your health care provider. °This information is not intended to replace advice given to you by your health care provider. Make sure you discuss any questions you have with your health care provider. °Document Revised: 10/19/2017 Document Reviewed: 10/19/2017 °Elsevier Patient Education ©  2020 [employer] °Moderate Conscious Sedation, Adult, Care After °These instructions provide you with information about caring for yourself after your procedure. Your health care provider may also give you more specific instructions. Your treatment has been planned according to current medical practices, but problems sometimes occur. Call your health care provider if you have any problems or questions after your procedure. °What can I expect after the procedure? °After your procedure, it is common: °· To feel sleepy for several hours. °· To feel clumsy and have poor balance for several hours. °· To have poor judgment for several hours. °· To vomit if you eat too soon. °Follow these instructions at home: °For at least 24 hours after the procedure: ° °· Do not: °? Participate in activities where you could fall or become injured. °? Drive. °? Use heavy machinery. °? Drink alcohol. °? Take sleeping pills or medicines that cause drowsiness. °? Make important decisions or sign legal documents. °? Take care of children on your own. °· Rest. °Eating and drinking °· Follow the diet recommended by your health care provider. °· If you vomit: °? Drink water, juice, or soup when you can drink without vomiting. °? Make sure you have little or no nausea before eating solid foods. °General instructions °· Have a responsible adult stay with you until you are awake and alert. °· Take over-the-counter and prescription medicines only as told by your health care provider. °· If you smoke, do not smoke without supervision. °· Keep all follow-up visits as told by your health care provider. This is important. °Contact a health care provider if: °· You keep feeling nauseous or you keep vomiting. °· You feel light-headed. °· You develop a rash. °· You have a fever. °Get help right away if: °· You have trouble breathing. °This information is not intended to replace advice given to you by your health care provider. Make sure you discuss any  questions you have with your health care provider. °Document Revised: 03/05/2017 Document Reviewed: 07/13/2015 °Elsevier Patient Education © 2020 [employer] Inc. ° °

## 2020-02-01 DIAGNOSIS — C61 Malignant neoplasm of prostate: Secondary | ICD-10-CM | POA: Diagnosis not present

## 2020-02-02 ENCOUNTER — Other Ambulatory Visit: Payer: Self-pay

## 2020-02-02 DIAGNOSIS — C61 Malignant neoplasm of prostate: Secondary | ICD-10-CM | POA: Diagnosis not present

## 2020-02-02 MED ORDER — HYDROMORPHONE HCL 4 MG PO TABS: 4.0000 mg | ORAL_TABLET | ORAL | 0 refills | Status: DC | PRN

## 2020-02-02 NOTE — Progress Notes (Signed)
Refill request

## 2020-02-03 DIAGNOSIS — C61 Malignant neoplasm of prostate: Secondary | ICD-10-CM | POA: Diagnosis not present

## 2020-02-06 ENCOUNTER — Emergency Department (HOSPITAL_COMMUNITY): Payer: Medicare Other

## 2020-02-06 ENCOUNTER — Encounter (HOSPITAL_COMMUNITY): Payer: Self-pay | Admitting: Emergency Medicine

## 2020-02-06 ENCOUNTER — Emergency Department (HOSPITAL_COMMUNITY)
Admission: EM | Admit: 2020-02-06 | Discharge: 2020-02-06 | Disposition: A | Discharge status: Home / Self Care | Payer: Medicare Other | Attending: Emergency Medicine | Admitting: Emergency Medicine

## 2020-02-06 ENCOUNTER — Other Ambulatory Visit: Payer: Self-pay

## 2020-02-06 ENCOUNTER — Telehealth: Payer: Self-pay

## 2020-02-06 DIAGNOSIS — W19XXXA Unspecified fall, initial encounter: Secondary | ICD-10-CM | POA: Diagnosis not present

## 2020-02-06 DIAGNOSIS — Y92009 Unspecified place in unspecified non-institutional (private) residence as the place of occurrence of the external cause: Secondary | ICD-10-CM | POA: Diagnosis not present

## 2020-02-06 DIAGNOSIS — Z79899 Other long term (current) drug therapy: Secondary | ICD-10-CM | POA: Insufficient documentation

## 2020-02-06 DIAGNOSIS — Z8546 Personal history of malignant neoplasm of prostate: Secondary | ICD-10-CM | POA: Diagnosis not present

## 2020-02-06 DIAGNOSIS — C799 Secondary malignant neoplasm of unspecified site: Secondary | ICD-10-CM | POA: Diagnosis not present

## 2020-02-06 DIAGNOSIS — R42 Dizziness and giddiness: Secondary | ICD-10-CM | POA: Diagnosis not present

## 2020-02-06 DIAGNOSIS — Z85848 Personal history of malignant neoplasm of other parts of nervous tissue: Secondary | ICD-10-CM | POA: Insufficient documentation

## 2020-02-06 DIAGNOSIS — N1330 Unspecified hydronephrosis: Secondary | ICD-10-CM | POA: Diagnosis not present

## 2020-02-06 DIAGNOSIS — M25551 Pain in right hip: Secondary | ICD-10-CM | POA: Diagnosis not present

## 2020-02-06 DIAGNOSIS — R519 Headache, unspecified: Secondary | ICD-10-CM | POA: Diagnosis not present

## 2020-02-06 DIAGNOSIS — G893 Neoplasm related pain (acute) (chronic): Secondary | ICD-10-CM | POA: Diagnosis not present

## 2020-02-06 DIAGNOSIS — C61 Malignant neoplasm of prostate: Secondary | ICD-10-CM | POA: Diagnosis not present

## 2020-02-06 DIAGNOSIS — K5900 Constipation, unspecified: Secondary | ICD-10-CM | POA: Diagnosis not present

## 2020-02-06 LAB — CBC
HCT: 30.6 % — ABNORMAL LOW (ref 39.0–52.0)
Hemoglobin: 9.9 g/dL — ABNORMAL LOW (ref 13.0–17.0)
MCH: 30.8 pg (ref 26.0–34.0)
MCHC: 32.4 g/dL (ref 30.0–36.0)
MCV: 95.3 fL (ref 80.0–100.0)
Platelets: 189 10*3/uL (ref 150–400)
RBC: 3.21 MIL/uL — ABNORMAL LOW (ref 4.22–5.81)
RDW: 12.6 % (ref 11.5–15.5)
WBC: 26.8 10*3/uL — ABNORMAL HIGH (ref 4.0–10.5)
nRBC: 0.1 % (ref 0.0–0.2)

## 2020-02-06 LAB — BASIC METABOLIC PANEL
Anion gap: 11 (ref 5–15)
BUN: 16 mg/dL (ref 8–23)
CO2: 23 mmol/L (ref 22–32)
Calcium: 8.7 mg/dL — ABNORMAL LOW (ref 8.9–10.3)
Chloride: 100 mmol/L (ref 98–111)
Creatinine, Ser: 1.16 mg/dL (ref 0.61–1.24)
GFR, Estimated: >60 mL/min (ref >60)
Glucose, Bld: 100 mg/dL — ABNORMAL HIGH (ref 70–99)
Potassium: 3.5 mmol/L (ref 3.5–5.1)
Sodium: 134 mmol/L — ABNORMAL LOW (ref 135–145)

## 2020-02-06 LAB — URINALYSIS, ROUTINE W REFLEX MICROSCOPIC
Bilirubin Urine: NEGATIVE
Glucose, UA: NEGATIVE mg/dL
Hgb urine dipstick: NEGATIVE
Ketones, ur: 5 mg/dL — AB
Leukocytes,Ua: NEGATIVE
Nitrite: NEGATIVE
Protein, ur: NEGATIVE mg/dL
Specific Gravity, Urine: 1.019 (ref 1.005–1.030)
pH: 6 (ref 5.0–8.0)

## 2020-02-06 LAB — CBG MONITORING, ED: Glucose-Capillary: 100 mg/dL — ABNORMAL HIGH (ref 70–99)

## 2020-02-06 MED ORDER — HYDROMORPHONE HCL 1 MG/ML IJ SOLN
0.5000 mg | Freq: Once | INTRAMUSCULAR | Status: AC
  Administered 2020-02-06: 0.5 mg via INTRAVENOUS
  Filled 2020-02-06: qty 1

## 2020-02-06 MED ORDER — HYDROMORPHONE HCL 1 MG/ML IJ SOLN
1.0000 mg | Freq: Once | INTRAMUSCULAR | Status: AC
  Administered 2020-02-06: 1 mg via INTRAVENOUS
  Filled 2020-02-06: qty 1

## 2020-02-06 MED ORDER — SODIUM CHLORIDE (PF) 0.9 % IJ SOLN
INTRAMUSCULAR | Status: AC
  Filled 2020-02-06: qty 50

## 2020-02-06 MED ORDER — IOHEXOL 300 MG/ML  SOLN
100.0000 mL | Freq: Once | INTRAMUSCULAR | Status: AC | PRN
  Administered 2020-02-06: 100 mL via INTRAVENOUS

## 2020-02-06 NOTE — ED Notes (Signed)
Discharge paperwork reviewed with pt. All questions answered prior to discharge.  Pt wheeled to ED entrance by NT.

## 2020-02-06 NOTE — ED Provider Notes (Signed)
[facility] DEPT Provider Note  CSN: [id number] Arrival date & time: 02/06/20 4098  Chief Complaint(s) Fall and Dizziness  HPI Andre Glass is a 69 y.o. male with extensive past medical history listed below including metastatic prostate cancer currently undergoing chemotherapy who presents to the emergency department after a fall at home.  Patient reports that he fell while attempting to walk back from the bathroom.  States that at baseline he has difficulty ambulating and uses a walker and a cane.  After urinating, patient was walking back using his cane.  He reports his legs giving out on him falling onto his right side.  He is unsure whether he hit his head but denies any loss of consciousness.  He is endorsing right-sided hip pain.  Denies any chest pain or shortness of breath.  No abdominal pain.  He is endorsing nausea and nonbloody nonbilious emesis related to the chemotherapy.  Patient also reports intermittent headaches and dizziness.  Denies any other physical complaints.   HPI  Past Medical History Past Medical History:  Diagnosis Date  . Anemia   . History of kidney stones   . Prostate cancer ([place])   . UTI (urinary tract infection)    Patient Active Problem List   Diagnosis Date Noted  . Intractable pain 01/18/2020  . BPH (benign prostatic hyperplasia) 11/08/2019  . Catheter-associated urinary tract infection ([place]) 09/14/2019  . Drug induced constipation 07/18/2019  . Goals of care, counseling/discussion 06/26/2019  . 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 Malignant neoplasm of prostate ([place]) 04/19/2019   Home Medication(s) Prior to Admission medications   Medication Sig Start Date End Date Taking? Authorizing Provider  Ferrous Sulfate (IRON PO) Take 1 tablet by mouth daily.   Yes [provider]  HYDROmorphone (DILAUDID) 4 MG tablet Take 1 tablet (4 mg total) by mouth every 4 (four) hours as needed for severe pain. 02/02/20  Yes  [name], MD  lidocaine-prilocaine (EMLA) cream Apply 1 application topically as needed. 01/15/20  Yes [name], MD  morphine (MS CONTIN) 30 MG 12 hr tablet Take 1 tablet (30 mg total) by mouth every 8 (eight) hours. 01/16/20  Yes [name], MD  Multiple Vitamin (MULTIVITAMIN WITH MINERALS) TABS tablet Take 1 tablet by mouth daily.   Yes [provider]  ondansetron (ZOFRAN) 4 MG tablet TAKE 1 TABLET (4 MG TOTAL) BY MOUTH 3 (THREE) TIMES DAILY. Patient taking differently: Take 4 mg by mouth 3 (three) times daily as needed for nausea or vomiting.  10/31/19  Yes [name], MD  oxybutynin (DITROPAN) 5 MG tablet Take 5 mg by mouth 3 (three) times daily as needed for bladder spasms. 10/28/19  Yes [provider]  Plecanatide (TRULANCE) 3 MG TABS Take 3 mg by mouth daily. 10/27/19  Yes [name], [name], DO  polyethylene glycol (MIRALAX) 17 g packet Take 17 g by mouth 2 (two) times daily. 01/19/20  Yes Hongalgi, Lenis Dickinson, MD  predniSONE (DELTASONE) 5 MG tablet Take 1 tablet (5 mg total) by mouth daily with breakfast. 10/25/19  Yes Shadad, Mathis Dad, MD  prochlorperazine (COMPAZINE) 10 MG tablet Take 1 tablet (10 mg total) by mouth every 6 (six) hours as needed for nausea or vomiting. 01/15/20  Yes [name], MD  senna (SENOKOT) 8.6 MG TABS tablet Take 2 tablets by mouth daily.    Yes [provider]  tamsulosin (FLOMAX) 0.4 MG CAPS capsule Take 1 capsule (0.4 mg total) by  mouth daily after supper. Patient taking differently: Take 0.4 mg by mouth daily as needed (to help with urination).  10/27/19  Yes [name], DO  abiraterone acetate (ZYTIGA) 250 MG tablet Take 4 tablets (1,000 mg total) by mouth daily. Take on an empty stomach 1 hour before or 2 hours after a meal Patient not taking: Reported on 02/06/2020 12/25/19   [name], MD                                                                                                                                     Past Surgical History Past Surgical History:  Procedure Laterality Date  . IR IMAGING GUIDED PORT INSERTION  01/31/2020  . TRANSURETHRAL RESECTION OF PROSTATE N/A 11/08/2019   Procedure: TRANSURETHRAL RESECTION OF THE PROSTATE (TURP);  Surgeon: [name], MD;  Location: [facility] ORS;  Service: Urology;  Laterality: N/A;   Family History Family History  Problem Relation Age of Onset  . Stomach cancer Mother   . Pancreatic cancer Sister   . Breast cancer Neg Hx   . Prostate cancer Neg Hx   . Colon cancer Neg Hx     Social History Social History   Tobacco Use  . Smoking status: Never Smoker  . Smokeless tobacco: Never Used  Vaping Use  . Vaping Use: Never used  Substance Use Topics  . Alcohol use: Never  . Drug use: Never   Allergies Ibuprofen and Shrimp [shellfish allergy]  Review of Systems Review of Systems All other systems are reviewed and are negative for acute change except as noted in the HPI  Physical Exam Vital Signs  I have reviewed the triage vital signs BP 140/71   Pulse (!) 59   Temp 98.2 F (36.8 C) (Oral)   Resp 15   SpO2 99%   Physical Exam Vitals reviewed.  Constitutional:      General: He is not in acute distress.    Appearance: He is well-developed. He is not diaphoretic.  HENT:     Head: Normocephalic and atraumatic.     Nose: Nose normal.  Eyes:     General: No scleral icterus.       Right eye: No discharge.        Left eye: No discharge.     Conjunctiva/sclera: Conjunctivae normal.     Pupils: Pupils are equal, round, and reactive to light.  Cardiovascular:     Rate and Rhythm: Normal rate and regular rhythm.     Heart sounds: No murmur heard.  No friction rub. No gallop.   Pulmonary:     Effort: Pulmonary effort is normal. No respiratory distress.     Breath sounds: Normal breath sounds. No stridor. No rales.  Abdominal:     General: There is no distension.     Palpations: Abdomen is soft.     Tenderness: There is  abdominal tenderness in the  suprapubic area.  Musculoskeletal:        General: No tenderness.     Cervical back: Normal range of motion and neck supple.       Legs:  Skin:    General: Skin is warm and dry.     Findings: No erythema or rash.  Neurological:     Mental Status: He is alert and oriented to person, place, and time.     Comments: Unable to fully assess BLE strength due to severity of pain with movement and manipulation. Sensation fully intact     ED Results and Treatments Labs (all labs ordered are listed, but only abnormal results are displayed) Labs Reviewed  BASIC METABOLIC PANEL - Abnormal; Notable for the following components:      Result Value   Sodium 134 (*)    Glucose, Bld 100 (*)    Calcium 8.7 (*)    All other components within normal limits  CBC - Abnormal; Notable for the following components:   WBC 26.8 (*)    RBC 3.21 (*)    Hemoglobin 9.9 (*)    HCT 30.6 (*)    All other components within normal limits  URINALYSIS, ROUTINE W REFLEX MICROSCOPIC - Abnormal; Notable for the following components:   Ketones, ur 5 (*)    All other components within normal limits  CBG MONITORING, ED - Abnormal; Notable for the following components:   Glucose-Capillary 100 (*)    All other components within normal limits  CBG MONITORING, ED                                                                                                                         EKG  EKG Interpretation  Date/Time:  Tuesday February 06 2020 01:02:31 EDT Ventricular Rate:  62 PR Interval:    QRS Duration: 89 QT Interval:  387 QTC Calculation: 393 R Axis:   -28 Text Interpretation: Sinus rhythm Atrial premature complex Borderline left axis deviation Abnormal R-wave progression, early transition Consider anterior infarct No acute changes Confirmed by Addison Lank [phone]) on 02/06/2020 1:16:17 AM      Radiology CT Head Wo Contrast  Result Date: 02/06/2020 CLINICAL DATA:  Dizziness,  fall EXAM: CT HEAD WITHOUT CONTRAST TECHNIQUE: Contiguous axial images were obtained from the base of the skull through the vertex without intravenous contrast. COMPARISON:  None. FINDINGS: Brain: No acute intracranial abnormality. Specifically, no hemorrhage, hydrocephalus, mass lesion, acute infarction, or significant intracranial injury. Vascular: No hyperdense vessel or unexpected calcification. Skull: No acute calvarial abnormality. Sinuses/Orbits: Visualized paranasal sinuses and mastoids clear. Orbital soft tissues unremarkable. Other: None IMPRESSION: Normal study. Electronically Signed   By: Rolm Baptise M.D.   On: 02/06/2020 02:01   CT ABDOMEN PELVIS W CONTRAST  Result Date: 02/06/2020 CLINICAL DATA:  Diverticulitis suspected. Constipation and elevated white count. Undergoing chemotherapy. EXAM: CT ABDOMEN AND PELVIS WITH CONTRAST TECHNIQUE: Multidetector CT imaging of the abdomen and pelvis was performed using the standard protocol following  bolus administration of intravenous contrast. CONTRAST:  155mL OMNIPAQUE IOHEXOL 300 MG/ML  SOLN COMPARISON:  01/18/2020 FINDINGS: Lower chest:  No contributory findings. Hepatobiliary: No focal liver abnormality.No evidence of biliary obstruction or stone. Pancreas: Unremarkable. Spleen: Unremarkable. Adrenals/Urinary Tract: Negative adrenals. Mild right and borderline left hydronephrosis with right hydroureter to the level of right eccentric bladder base mass which is contiguous with the prostate. The bladder is moderately distended. Stomach/Bowel: Prostate tumor is indistinguishable from the ventral serosa of the lower rectum, unchanged. No bowel obstruction. No visible bowel inflammation, including appendicitis and diverticulitis . Vascular/Lymphatic: Mild atheromatous calcification. Retroperitoneal nodularity in the pelvis attributed to metastatic disease. A nodule right of the lower rectum measures 16 mm. A left periaortic node which measures 25 x 13 mm,  unchanged. Reproductive:Prostate mass invading the bladder and extending posteriorly into the retroperitoneal fat. Other: No ascites or pneumoperitoneum. Musculoskeletal: Multifocal sclerotic metastatic disease. No acute fracture. IMPRESSION: 1. No acute finding or change from 2 weeks prior. 2. Known prostate cancer with extracapsular tumor and bladder invasion, greater on the right where there is covering of the UVJ and mild hydroureteronephrosis. 3. Sclerotic osseous metastatic disease. Electronically Signed   By: Monte Fantasia M.D.   On: 02/06/2020 04:15    Pertinent labs & imaging results that were available during my care of the patient were reviewed by me and considered in my medical decision making (see chart for details).  Medications Ordered in ED Medications  sodium chloride (PF) 0.9 % injection (has no administration in time range)  HYDROmorphone (DILAUDID) injection 1 mg (1 mg Intravenous Given 02/06/20 0301)  iohexol (OMNIPAQUE) 300 MG/ML solution 100 mL (100 mLs Intravenous Contrast Given 02/06/20 0353)  HYDROmorphone (DILAUDID) injection 0.5 mg (0.5 mg Intravenous Given 02/06/20 9150)                                                                                                                                    Procedures Procedures  (including critical care time)  Medical Decision Making / ED Course I have reviewed the nursing notes for this encounter and the patient's prior records (if available in EHR or on provided paperwork).   Andre Glass was evaluated in Emergency Department on 02/06/2020 for the symptoms described in the history of present illness. He was evaluated in the context of the global COVID-19 pandemic, which necessitated consideration that the patient might be at risk for infection with the SARS-CoV-2 virus that causes COVID-19. Institutional protocols and algorithms that pertain to the evaluation of patients at risk for COVID-19 are in a state of rapid change  based on information released by regulatory bodies including the CDC and federal and state organizations. These policies and algorithms were followed during the patient's care in the ED.  Patient is here for fall at home in the setting of chronic pain due to metastatic prostate cancer. Recent MRI of the lumbar spine and revealed progressive  bony metastatic disease without stenosis or evidence of cauda equina.  On exam here patient does have tenderness to palpation of the right hip as well as tenderness over the suprapubic region.  Screening labs notable for significant leukocytosis and mild anemia. UA pending.  Metabolic panel reassuring. Obtain CT head which was negative for any evidence of large metastatic disease or ICH. Given leukocytosis and abdominal tenderness, will obtain a CT abdomen and pelvis.  In the interim patient was provided with pain medicine.  On reassessment he was able to move his lower extremities more.  CT scan returned without any acute process.  Did reveal known metastatic prostate cancer.  UA without evidence of infection.  Patient able to ambulate steadily with walker.  Discussed case with Dr. Jacqualine Mau of oncology regarding his leukocytosis and he was able to see that the patient had got Neulasta after his recent chemo.  This explains the increase in his leukocytosis.  Patient has home health daily.      Final Clinical Impression(s) / ED Diagnoses Final diagnoses:  Cancer related pain  Fall, initial encounter     The patient appears reasonably screened and/or stabilized for discharge and I doubt any other medical condition or other [facility] requiring further screening, evaluation, or treatment in the ED at this time prior to discharge. Safe for discharge with strict return precautions.  Disposition: Discharge  Condition: Good  I have discussed the results, Dx and Tx plan with the patient/family who expressed understanding and agree(s) with the plan. Discharge  instructions discussed at length. The patient/family was given strict return precautions who verbalized understanding of the instructions. No further questions at time of discharge.    ED Discharge Orders    None        Follow Up: [name], MD [place] [id number] [phone]  Call  As needed    This chart was dictated using voice recognition software.  Despite best efforts to proofread,  errors can occur which can change the documentation meaning.   Fatima Blank, MD 02/06/20 [phone]

## 2020-02-06 NOTE — ED Triage Notes (Addendum)
Pt BIB EMS from home c/o two falls that occurred today. Pt fell on his right side and now c/o right sided head and hip pain. Pt unsure of how he fell and unsure of LOC. Not on blood thinners. Hx of prostate cancer. Last chemo tx Friday. Since Friday experiencing intermittent dizziness and N/V.

## 2020-02-06 NOTE — ED Notes (Signed)
Dr placed order for oral hydration. Pt was given a cup of ice water and encouraged to drink. Josey Dettmann, NT.

## 2020-02-06 NOTE — Telephone Encounter (Signed)
Faxed over referral for rolling walker with seat per Dr [name].

## 2020-02-06 NOTE — Telephone Encounter (Signed)
-----   Message from [name], MD sent at 02/06/2020  3:50 PM EDT ----- Ok to send Rx for a walker. Thanks ----- Message ----- From: Kennedy Bucker, LPN Sent: 67/09/7207   2:53 PM EDT To: [name], MD  Patient's daughter called with some change in status updates and some referral request. She states that the patient had a fall 11/01 and 11/02 and today's fall he went into the ED. States that he is having increased numbness in his hands and feet and more confusion. Wanted to know if you can refer patient to get a rolling walker with a seat. She also has some paperwork that needs to filled out about the change in his status so they can get more hours with in home care.   Kim LPN

## 2020-02-07 ENCOUNTER — Telehealth: Payer: Self-pay

## 2020-02-07 NOTE — Telephone Encounter (Signed)
-----   Message from [name], MD sent at 02/06/2020  4:43 PM EDT ----- He can use compazine before eating.  ----- Message ----- From: Kennedy Bucker, LPN Sent: 98/10/2156   4:40 PM EDT To: [name], MD  Patient's daughter just added that patient has not been eating due to fear of nausea and vomiting with eating.   Kim LPN ----- Message ----- From: [name], MD Sent: 02/06/2020   3:50 PM EDT To: Kennedy Bucker, LPN  Ok to send Rx for a walker. Thanks ----- Message ----- From: Kennedy Bucker, LPN Sent: 72/10/6182   2:53 PM EDT To: [name], MD  Patient's daughter called with some change in status updates and some referral request. She states that the patient had a fall 11/01 and 11/02 and today's fall he went into the ED. States that he is having increased numbness in his hands and feet and more confusion. Wanted to know if you can refer patient to get a rolling walker with a seat. She also has some paperwork that needs to filled out about the change in his status so they can get more hours with in home care.   Kim LPN

## 2020-02-07 NOTE — Telephone Encounter (Signed)
Spoke with patient's daughter about patient taking compazine before eating per Dr [name]. She verbalized understanding.

## 2020-02-14 ENCOUNTER — Other Ambulatory Visit: Payer: Self-pay

## 2020-02-14 ENCOUNTER — Inpatient Hospital Stay: Payer: Medicare Other

## 2020-02-14 ENCOUNTER — Inpatient Hospital Stay (HOSPITAL_BASED_OUTPATIENT_CLINIC_OR_DEPARTMENT_OTHER): Payer: Medicare Other | Admitting: Oncology

## 2020-02-14 ENCOUNTER — Inpatient Hospital Stay: Payer: Medicare Other | Attending: Oncology

## 2020-02-14 VITALS — BP 127/61 | HR 64 | Temp 97.8°F | Resp 17 | Ht 68.0 in | Wt 205.3 lb

## 2020-02-14 DIAGNOSIS — Z79818 Long term (current) use of other agents affecting estrogen receptors and estrogen levels: Secondary | ICD-10-CM | POA: Insufficient documentation

## 2020-02-14 DIAGNOSIS — G893 Neoplasm related pain (acute) (chronic): Secondary | ICD-10-CM | POA: Diagnosis not present

## 2020-02-14 DIAGNOSIS — Z923 Personal history of irradiation: Secondary | ICD-10-CM | POA: Insufficient documentation

## 2020-02-14 DIAGNOSIS — C7951 Secondary malignant neoplasm of bone: Secondary | ICD-10-CM | POA: Insufficient documentation

## 2020-02-14 DIAGNOSIS — C61 Malignant neoplasm of prostate: Secondary | ICD-10-CM

## 2020-02-14 DIAGNOSIS — Z95828 Presence of other vascular implants and grafts: Secondary | ICD-10-CM

## 2020-02-14 DIAGNOSIS — Z9221 Personal history of antineoplastic chemotherapy: Secondary | ICD-10-CM | POA: Diagnosis not present

## 2020-02-14 DIAGNOSIS — Z5111 Encounter for antineoplastic chemotherapy: Secondary | ICD-10-CM | POA: Insufficient documentation

## 2020-02-14 DIAGNOSIS — Z79899 Other long term (current) drug therapy: Secondary | ICD-10-CM | POA: Insufficient documentation

## 2020-02-14 LAB — CMP (CANCER CENTER ONLY)
ALT: <6 U/L (ref 0–44)
AST: 13 U/L — ABNORMAL LOW (ref 15–41)
Albumin: 2.8 g/dL — ABNORMAL LOW (ref 3.5–5.0)
Alkaline Phosphatase: 65 U/L (ref 38–126)
Anion gap: 8 (ref 5–15)
BUN: 8 mg/dL (ref 8–23)
CO2: 28 mmol/L (ref 22–32)
Calcium: 9.1 mg/dL (ref 8.9–10.3)
Chloride: 105 mmol/L (ref 98–111)
Creatinine: 1.07 mg/dL (ref 0.61–1.24)
GFR, Estimated: >60 mL/min (ref >60)
Glucose, Bld: 98 mg/dL (ref 70–99)
Potassium: 3.3 mmol/L — ABNORMAL LOW (ref 3.5–5.1)
Sodium: 141 mmol/L (ref 135–145)
Total Bilirubin: 0.4 mg/dL (ref 0.3–1.2)
Total Protein: 7.1 g/dL (ref 6.5–8.1)

## 2020-02-14 LAB — CBC WITH DIFFERENTIAL (CANCER CENTER ONLY)
Abs Immature Granulocytes: 0.1 10*3/uL — ABNORMAL HIGH (ref 0.00–0.07)
Basophils Absolute: 0.1 10*3/uL (ref 0.0–0.1)
Basophils Relative: 1 %
Eosinophils Absolute: 0.1 10*3/uL (ref 0.0–0.5)
Eosinophils Relative: 1 %
HCT: 29.6 % — ABNORMAL LOW (ref 39.0–52.0)
Hemoglobin: 9.7 g/dL — ABNORMAL LOW (ref 13.0–17.0)
Immature Granulocytes: 1 %
Lymphocytes Relative: 16 %
Lymphs Abs: 1.3 10*3/uL (ref 0.7–4.0)
MCH: 30.4 pg (ref 26.0–34.0)
MCHC: 32.8 g/dL (ref 30.0–36.0)
MCV: 92.8 fL (ref 80.0–100.0)
Monocytes Absolute: 0.8 10*3/uL (ref 0.1–1.0)
Monocytes Relative: 10 %
Neutro Abs: 5.9 10*3/uL (ref 1.7–7.7)
Neutrophils Relative %: 71 %
Platelet Count: 443 10*3/uL — ABNORMAL HIGH (ref 150–400)
RBC: 3.19 MIL/uL — ABNORMAL LOW (ref 4.22–5.81)
RDW: 13 % (ref 11.5–15.5)
WBC Count: 8.3 10*3/uL (ref 4.0–10.5)
nRBC: 0 % (ref 0.0–0.2)

## 2020-02-14 MED ORDER — SODIUM CHLORIDE 0.9 % IV SOLN
75.0000 mg/m2 | Freq: Once | INTRAVENOUS | Status: AC
  Administered 2020-02-14: 160 mg via INTRAVENOUS
  Filled 2020-02-14: qty 16

## 2020-02-14 MED ORDER — SODIUM CHLORIDE 0.9% FLUSH
10.0000 mL | INTRAVENOUS | Status: DC | PRN
  Administered 2020-02-14: 10 mL
  Filled 2020-02-14: qty 10

## 2020-02-14 MED ORDER — SODIUM CHLORIDE 0.9 % IV SOLN
10.0000 mg | Freq: Once | INTRAVENOUS | Status: AC
  Administered 2020-02-14: 10 mg via INTRAVENOUS
  Filled 2020-02-14: qty 10

## 2020-02-14 MED ORDER — HYDROMORPHONE HCL 4 MG PO TABS: 4.0000 mg | ORAL_TABLET | ORAL | 0 refills | Status: DC | PRN

## 2020-02-14 MED ORDER — SODIUM CHLORIDE 0.9 % IV SOLN
Freq: Once | INTRAVENOUS | Status: AC
  Filled 2020-02-14: qty 250

## 2020-02-14 MED ORDER — MORPHINE SULFATE ER 30 MG PO TBCR: 30.0000 mg | EXTENDED_RELEASE_TABLET | Freq: Three times a day (TID) | ORAL | 0 refills | Status: DC

## 2020-02-14 MED ORDER — SODIUM CHLORIDE 0.9% FLUSH
10.0000 mL | Freq: Once | INTRAVENOUS | Status: AC
  Administered 2020-02-14: 10 mL via INTRAVENOUS
  Filled 2020-02-14: qty 10

## 2020-02-14 MED ORDER — HEPARIN SOD (PORK) LOCK FLUSH 100 UNIT/ML IV SOLN
500.0000 [IU] | Freq: Once | INTRAVENOUS | Status: AC | PRN
  Administered 2020-02-14: 500 [IU]
  Filled 2020-02-14: qty 5

## 2020-02-14 NOTE — Progress Notes (Signed)
Hematology and Oncology Follow Up Visit  Andre Glass [id number] [birth year] 69 y.o. 02/14/2020 8:05 AM Andre Glass, Glass, [name], DO   Principle Diagnosis: 69 year old man with advanced prostate cancer with disease to the bone diagnosed in December 2020.  He has castration-resistant after initially was found to have Gleason score 4+5 = 9 and a PSA of 366. Prior Therapy: He is status post prostate biopsy completed on December 3 of 2020.  Pathology showed a Gleason score 4+5 = 9 with 12 out of 12 cores.  He is status post both of radiation therapy to the spine between T9 and L1.  He received 30 Gray in 10 fractions between January 15 and 28, 2021.   Firmagon 240 mg started on May 31, 2019.  Zytiga 1000 mg daily with prednisone 5 mg daily started in May 2021. Therapy discontinued in October of 2021 due to progression of disease.   Current therapy:   Eligard every 4 months with next injection scheduled for March 10, 2020.  Taxotere chemotherapy with 75 mg per metered squared every 3 weeks.  Is here for cycle 2 of therapy.  Interim History: [name] presents today for repeat evaluation.  Since the last visit, he received the first cycle of chemotherapy without any major complications.  He did report some mild nausea and had an episode of dizziness that required intravenous hydration.  He had denies any vomiting or abdominal pain.  He denies any diarrhea.  His bone pain has been manageable currently with morphine and breakthrough Dilaudid.  He still has some occasional constipation issues.   Medications: Updated on review. Current Outpatient Medications  Medication Sig Dispense Refill  . abiraterone acetate (ZYTIGA) 250 MG tablet Take 4 tablets (1,000 mg total) by mouth daily. Take on an empty stomach 1 hour before or 2 hours after a meal (Patient not taking: Reported on 02/06/2020) 120 tablet 0  . Ferrous Sulfate (IRON PO) Take 1 tablet by mouth daily.    Marland Kitchen HYDROmorphone  (DILAUDID) 4 MG tablet Take 1 tablet (4 mg total) by mouth every 4 (four) hours as needed for severe pain. 60 tablet 0  . lidocaine-prilocaine (EMLA) cream Apply 1 application topically as needed. 30 g 0  . morphine (MS CONTIN) 30 MG 12 hr tablet Take 1 tablet (30 mg total) by mouth every 8 (eight) hours. 90 tablet 0  . Multiple Vitamin (MULTIVITAMIN WITH MINERALS) TABS tablet Take 1 tablet by mouth daily.    . ondansetron (ZOFRAN) 4 MG tablet TAKE 1 TABLET (4 MG TOTAL) BY MOUTH 3 (THREE) TIMES DAILY. (Patient taking differently: Take 4 mg by mouth 3 (three) times daily as needed for nausea or vomiting. ) 20 tablet 0  . oxybutynin (DITROPAN) 5 MG tablet Take 5 mg by mouth 3 (three) times daily as needed for bladder spasms.    . Plecanatide (TRULANCE) 3 MG TABS Take 3 mg by mouth daily. 30 tablet 2  . polyethylene glycol (MIRALAX) 17 g packet Take 17 g by mouth 2 (two) times daily. 60 each 0  . predniSONE (DELTASONE) 5 MG tablet Take 1 tablet (5 mg total) by mouth daily with breakfast. 90 tablet 3  . prochlorperazine (COMPAZINE) 10 MG tablet Take 1 tablet (10 mg total) by mouth every 6 (six) hours as needed for nausea or vomiting. 30 tablet 0  . senna (SENOKOT) 8.6 MG TABS tablet Take 2 tablets by mouth daily.     . tamsulosin (FLOMAX) 0.4 MG CAPS capsule Take 1 capsule (  0.4 mg total) by mouth daily after supper. (Patient taking differently: Take 0.4 mg by mouth daily as needed (to help with urination). ) 30 capsule 3   No current facility-administered medications for this visit.     Allergies:  Allergies  Allergen Reactions  . Ibuprofen Anaphylaxis  . Shrimp [Shellfish Allergy] Anaphylaxis       Physical Exam:      Blood pressure 127/61, pulse 64, temperature 97.8 F (36.6 C), temperature source Tympanic, resp. rate 17, height 5\' 8"  (1.727 m), weight 205 lb 4.8 oz (93.1 kg), SpO2 99 %.       ECOG: 1   General appearance: Comfortable appearing without any discomfort Head:  Normocephalic without any trauma Oropharynx: Mucous membranes are moist and pink without any thrush or ulcers. Eyes: Pupils are equal and round reactive to light. Lymph nodes: No cervical, supraclavicular, inguinal or axillary lymphadenopathy.   Heart:regular rate and rhythm.  S1 and S2 without leg edema. Lung: Clear without any rhonchi or wheezes.  No dullness to percussion. Abdomin: Soft, nontender, nondistended with good bowel sounds.  No hepatosplenomegaly. Musculoskeletal: No joint deformity or effusion.  Full range of motion noted. Neurological: No deficits noted on motor, sensory and deep tendon reflex exam. Skin: No petechial rash or dryness.  Appeared moist.               Lab Results: Lab Results  Component Value Date   WBC 26.8 (H) 02/06/2020   HGB 9.9 (L) 02/06/2020   HCT 30.6 (L) 02/06/2020   MCV 95.3 02/06/2020   PLT 189 02/06/2020     Chemistry      Component Value Date/Time   NA 134 (L) 02/06/2020 0105   K 3.5 02/06/2020 0105   CL 100 02/06/2020 0105   CO2 23 02/06/2020 0105   BUN 16 02/06/2020 0105   CREATININE 1.16 02/06/2020 0105   CREATININE 1.39 (H) 01/23/2020 1110      Component Value Date/Time   CALCIUM 8.7 (L) 02/06/2020 0105   ALKPHOS 98 01/23/2020 1110   AST 14 (L) 01/23/2020 1110   ALT 7 01/23/2020 1110   BILITOT 0.3 01/23/2020 1110          Impression and Plan:   69 year old man with:  1.    Advanced prostate cancer with disease to the bone and lymphadenopathy diagnosed in December 2020.  He developed castration-resistant disease rapidly on Zytiga.  He is currently on Taxotere chemotherapy and completed the first cycle without complications.  Risks and benefits of proceeding with cycle 2 of therapy were reviewed.  Potential complications include nausea, vomiting, myelosuppression, neutropenia and sepsis were reiterated.  He has improved clinically since the start of chemotherapy and I recommended proceeding with cycle 2  without any dose reduction or delay.      2.  Androgen deprivation therapy: He received Eligard on November 29, 2019 and will be repeated in 4 months.  Complications including weight gain and hot flashes were reiterated.  3.  Bone directed therapy: The plan is to start Xgeva once he has completed dental clearance.  For the time being I recommend continued calcium and vitamin D supplements.  4.   Intractable pain: He remains on morphine and Dilaudid.  Pain is manageable at this time and will refill for him today.  We will not make any adjustments on the dosing.  I recommended decreasing his Dilaudid breakthrough frequency  5. Growth factor support: He is at risk of developing neutropenia and sepsis and will  receive growth factor support after each cycle of chemotherapy.  6.  IV access: Port-A-Cath inserted without any complications we will continue to be in use.  7.  Follow-up: He will return in 3 weeks for the next cycle of therapy.    30  minutes were dedicated to this encounter.  Time was spent on reviewing his disease status, discussing treatment options and future plan of care review.   Zola Button, MD 11/10/20218:05 AM

## 2020-02-14 NOTE — Patient Instructions (Signed)
[facility] Discharge Instructions for Patients Receiving Chemotherapy  Today you received the following chemotherapy agents: Docetaxel (Taxotere).  To help prevent nausea and vomiting after your treatment, we encourage you to take your nausea medication as prescribed.  If you develop nausea and vomiting that is not controlled by your nausea medication, call the clinic.   BELOW ARE SYMPTOMS THAT SHOULD BE REPORTED IMMEDIATELY:  *FEVER GREATER THAN 100.5 F  *CHILLS WITH OR WITHOUT FEVER  NAUSEA AND VOMITING THAT IS NOT CONTROLLED WITH YOUR NAUSEA MEDICATION  *UNUSUAL SHORTNESS OF BREATH  *UNUSUAL BRUISING OR BLEEDING  TENDERNESS IN MOUTH AND THROAT WITH OR WITHOUT PRESENCE OF ULCERS  *URINARY PROBLEMS  *BOWEL PROBLEMS  UNUSUAL RASH Items with * indicate a potential emergency and should be followed up as soon as possible.  Feel free to call the clinic should you have any questions or concerns. The clinic phone number is [phone].  Please show the CHEMO ALERT CARD at check-in to the Emergency Department and triage nurse.   

## 2020-02-14 NOTE — Patient Instructions (Signed)

## 2020-02-15 LAB — PROSTATE-SPECIFIC AG, SERUM (LABCORP): Prostate Specific Ag, Serum: 33.4 ng/mL — ABNORMAL HIGH (ref 0.0–4.0)

## 2020-02-16 ENCOUNTER — Other Ambulatory Visit: Payer: Medicare Other

## 2020-02-16 ENCOUNTER — Inpatient Hospital Stay: Payer: Medicare Other

## 2020-02-16 ENCOUNTER — Other Ambulatory Visit: Payer: Self-pay

## 2020-02-16 ENCOUNTER — Telehealth: Payer: Self-pay | Admitting: Oncology

## 2020-02-16 VITALS — BP 124/64 | HR 71 | Temp 97.6°F | Resp 18

## 2020-02-16 DIAGNOSIS — Z9221 Personal history of antineoplastic chemotherapy: Secondary | ICD-10-CM | POA: Diagnosis not present

## 2020-02-16 DIAGNOSIS — Z79818 Long term (current) use of other agents affecting estrogen receptors and estrogen levels: Secondary | ICD-10-CM | POA: Diagnosis not present

## 2020-02-16 DIAGNOSIS — Z79899 Other long term (current) drug therapy: Secondary | ICD-10-CM | POA: Diagnosis not present

## 2020-02-16 DIAGNOSIS — C61 Malignant neoplasm of prostate: Secondary | ICD-10-CM

## 2020-02-16 DIAGNOSIS — Z5111 Encounter for antineoplastic chemotherapy: Secondary | ICD-10-CM | POA: Diagnosis not present

## 2020-02-16 DIAGNOSIS — G893 Neoplasm related pain (acute) (chronic): Secondary | ICD-10-CM | POA: Diagnosis not present

## 2020-02-16 DIAGNOSIS — C7951 Secondary malignant neoplasm of bone: Secondary | ICD-10-CM | POA: Diagnosis not present

## 2020-02-16 DIAGNOSIS — Z923 Personal history of irradiation: Secondary | ICD-10-CM | POA: Diagnosis not present

## 2020-02-16 MED ORDER — LEUPROLIDE ACETATE (4 MONTH) 30 MG ~~LOC~~ KIT
30.0000 mg | PACK | Freq: Once | SUBCUTANEOUS | Status: AC
  Administered 2020-02-16: 30 mg via SUBCUTANEOUS

## 2020-02-16 NOTE — Telephone Encounter (Signed)
Scheduled per 11/10 los, patient has been called and notified.

## 2020-02-16 NOTE — Patient Instructions (Signed)

## 2020-03-01 ENCOUNTER — Telehealth: Payer: Self-pay | Admitting: *Deleted

## 2020-03-01 NOTE — Telephone Encounter (Signed)
Patient called office in response to VM to contact office for lab results.  Informed him that per Dr. Hazeline Junker message: "Please let him know his PSA is down" Patient verbalized understanding of information

## 2020-03-05 ENCOUNTER — Telehealth: Payer: Self-pay | Admitting: *Deleted

## 2020-03-05 ENCOUNTER — Other Ambulatory Visit: Payer: Self-pay | Admitting: *Deleted

## 2020-03-05 NOTE — Telephone Encounter (Signed)
Received vm call from pt asking for Abigail Butts to fit him in tomorrow for Education.  Reviewed chart & pt has had chemo class in Oct & I don't see any changes in meds since.  Called pt & he thinks he needs some refresher.  He reports having a lot of pain after last treatment but really couldn't describe the pain.  Informed that he has had education & since no new meds probably doesn't need to go through a full class. Suggested he talk with Dr [name] tomorrow & if we need to do further education, then we can schedule.  He seemed very hesitant about my answer but agreed to talk with Dr [name] tomorrow.

## 2020-03-06 ENCOUNTER — Inpatient Hospital Stay: Payer: Medicare Other

## 2020-03-06 ENCOUNTER — Other Ambulatory Visit: Payer: Medicare Other

## 2020-03-06 ENCOUNTER — Other Ambulatory Visit: Payer: Self-pay

## 2020-03-06 ENCOUNTER — Inpatient Hospital Stay: Payer: Medicare Other | Attending: Oncology

## 2020-03-06 ENCOUNTER — Inpatient Hospital Stay (HOSPITAL_BASED_OUTPATIENT_CLINIC_OR_DEPARTMENT_OTHER): Payer: Medicare Other | Admitting: Oncology

## 2020-03-06 VITALS — BP 170/70 | HR 50 | Resp 17

## 2020-03-06 VITALS — BP 160/72 | HR 66 | Temp 97.3°F | Resp 16 | Ht 68.0 in | Wt 203.3 lb

## 2020-03-06 DIAGNOSIS — C61 Malignant neoplasm of prostate: Secondary | ICD-10-CM | POA: Diagnosis not present

## 2020-03-06 DIAGNOSIS — Z7952 Long term (current) use of systemic steroids: Secondary | ICD-10-CM | POA: Insufficient documentation

## 2020-03-06 DIAGNOSIS — Z5189 Encounter for other specified aftercare: Secondary | ICD-10-CM | POA: Insufficient documentation

## 2020-03-06 DIAGNOSIS — Z79818 Long term (current) use of other agents affecting estrogen receptors and estrogen levels: Secondary | ICD-10-CM | POA: Diagnosis not present

## 2020-03-06 DIAGNOSIS — K5900 Constipation, unspecified: Secondary | ICD-10-CM | POA: Insufficient documentation

## 2020-03-06 DIAGNOSIS — Z5111 Encounter for antineoplastic chemotherapy: Secondary | ICD-10-CM | POA: Insufficient documentation

## 2020-03-06 DIAGNOSIS — Z79899 Other long term (current) drug therapy: Secondary | ICD-10-CM | POA: Insufficient documentation

## 2020-03-06 DIAGNOSIS — C7951 Secondary malignant neoplasm of bone: Secondary | ICD-10-CM | POA: Insufficient documentation

## 2020-03-06 DIAGNOSIS — G893 Neoplasm related pain (acute) (chronic): Secondary | ICD-10-CM | POA: Insufficient documentation

## 2020-03-06 LAB — CMP (CANCER CENTER ONLY)
ALT: <6 U/L (ref 0–44)
AST: 12 U/L — ABNORMAL LOW (ref 15–41)
Albumin: 2.9 g/dL — ABNORMAL LOW (ref 3.5–5.0)
Alkaline Phosphatase: 78 U/L (ref 38–126)
Anion gap: 4 — ABNORMAL LOW (ref 5–15)
BUN: 6 mg/dL — ABNORMAL LOW (ref 8–23)
CO2: 28 mmol/L (ref 22–32)
Calcium: 9 mg/dL (ref 8.9–10.3)
Chloride: 110 mmol/L (ref 98–111)
Creatinine: 0.98 mg/dL (ref 0.61–1.24)
GFR, Estimated: >60 mL/min (ref >60)
Glucose, Bld: 89 mg/dL (ref 70–99)
Potassium: 3.6 mmol/L (ref 3.5–5.1)
Sodium: 142 mmol/L (ref 135–145)
Total Bilirubin: 0.4 mg/dL (ref 0.3–1.2)
Total Protein: 6.6 g/dL (ref 6.5–8.1)

## 2020-03-06 LAB — CBC WITH DIFFERENTIAL (CANCER CENTER ONLY)
Abs Immature Granulocytes: 0.07 10*3/uL (ref 0.00–0.07)
Basophils Absolute: 0.1 10*3/uL (ref 0.0–0.1)
Basophils Relative: 1 %
Eosinophils Absolute: 0 10*3/uL (ref 0.0–0.5)
Eosinophils Relative: 1 %
HCT: 29.8 % — ABNORMAL LOW (ref 39.0–52.0)
Hemoglobin: 9.6 g/dL — ABNORMAL LOW (ref 13.0–17.0)
Immature Granulocytes: 1 %
Lymphocytes Relative: 20 %
Lymphs Abs: 1.3 10*3/uL (ref 0.7–4.0)
MCH: 30.3 pg (ref 26.0–34.0)
MCHC: 32.2 g/dL (ref 30.0–36.0)
MCV: 94 fL (ref 80.0–100.0)
Monocytes Absolute: 0.5 10*3/uL (ref 0.1–1.0)
Monocytes Relative: 8 %
Neutro Abs: 4.5 10*3/uL (ref 1.7–7.7)
Neutrophils Relative %: 69 %
Platelet Count: 187 10*3/uL (ref 150–400)
RBC: 3.17 MIL/uL — ABNORMAL LOW (ref 4.22–5.81)
RDW: 13.4 % (ref 11.5–15.5)
WBC Count: 6.5 10*3/uL (ref 4.0–10.5)
nRBC: 0 % (ref 0.0–0.2)

## 2020-03-06 MED ORDER — HYDROMORPHONE HCL 1 MG/ML IJ SOLN
1.0000 mg | Freq: Once | INTRAMUSCULAR | Status: AC
  Administered 2020-03-06: 1 mg via INTRAVENOUS

## 2020-03-06 MED ORDER — HYDROMORPHONE HCL 1 MG/ML IJ SOLN
INTRAMUSCULAR | Status: AC
  Filled 2020-03-06: qty 1

## 2020-03-06 MED ORDER — SODIUM CHLORIDE 0.9 % IV SOLN
10.0000 mg | Freq: Once | INTRAVENOUS | Status: AC
  Administered 2020-03-06: 10 mg via INTRAVENOUS
  Filled 2020-03-06: qty 10

## 2020-03-06 MED ORDER — SODIUM CHLORIDE 0.9 % IV SOLN
75.0000 mg/m2 | Freq: Once | INTRAVENOUS | Status: AC
  Administered 2020-03-06: 160 mg via INTRAVENOUS
  Filled 2020-03-06: qty 16

## 2020-03-06 MED ORDER — SODIUM CHLORIDE 0.9% FLUSH
10.0000 mL | INTRAVENOUS | Status: DC | PRN
  Administered 2020-03-06: 10 mL
  Filled 2020-03-06: qty 10

## 2020-03-06 MED ORDER — SODIUM CHLORIDE 0.9 % IV SOLN
Freq: Once | INTRAVENOUS | Status: AC
  Filled 2020-03-06: qty 250

## 2020-03-06 MED ORDER — HEPARIN SOD (PORK) LOCK FLUSH 100 UNIT/ML IV SOLN
500.0000 [IU] | Freq: Once | INTRAVENOUS | Status: AC | PRN
  Administered 2020-03-06: 500 [IU]
  Filled 2020-03-06: qty 5

## 2020-03-06 NOTE — Progress Notes (Signed)
Hematology and Oncology Follow Up Visit  Andre Glass [id number] 11-11-1950 69 y.o. 03/06/2020 11:19 AM [name], DOWelborn, [name], DO   Principle Diagnosis: 69 year old man with castration-resistant advanced prostate cancer with disease to the bone diagnosed in December 2020.  He presented with Gleason score 4+5 = 9 and a PSA of 366 and advanced disease at time of diagnosis.   Prior Therapy: He is status post prostate biopsy completed on December 3 of 2020.  Pathology showed a Gleason score 4+5 = 9 with 12 out of 12 cores.  He is status post both of radiation therapy to the spine between T9 and L1.  He received 30 Gray in 10 fractions between January 15 and 28, 2021.   Firmagon 240 mg started on May 31, 2019.  Zytiga 1000 mg daily with prednisone 5 mg daily started in May 2021. Therapy discontinued in October of 2021 due to progression of disease.   Current therapy:   Eligard every 4 months with next injection scheduled for March 10, 2020.  Taxotere chemotherapy with 75 mg per metered squared every 3 weeks.  He is here for cycle 3 of therapy.  Interim History: Ms. Mayeda returns today for a follow-up visit.  Since the last visit, he completed 2 cycles of chemotherapy without any major complaints.  He denies any nausea, vomiting or abdominal pain.  He did have one episode where he threw up this week but and related to chemotherapy.  He denies any worsening bone pain or pathological fractures.  He find himself using less Dilaudid at this time.  He denies any recent hospitalization or illnesses.  Medications: Unchanged on review. Current Outpatient Medications  Medication Sig Dispense Refill  .      Marland Kitchen Ferrous Sulfate (IRON PO) Take 1 tablet by mouth daily.    Marland Kitchen HYDROmorphone (DILAUDID) 4 MG tablet Take 1 tablet (4 mg total) by mouth every 4 (four) hours as needed for severe pain. 60 tablet 0  . lidocaine-prilocaine (EMLA) cream Apply 1 application topically as needed. 30 g 0   . morphine (MS CONTIN) 30 MG 12 hr tablet Take 1 tablet (30 mg total) by mouth every 8 (eight) hours. 90 tablet 0  . Multiple Vitamin (MULTIVITAMIN WITH MINERALS) TABS tablet Take 1 tablet by mouth daily.    . ondansetron (ZOFRAN) 4 MG tablet TAKE 1 TABLET (4 MG TOTAL) BY MOUTH 3 (THREE) TIMES DAILY. (Patient taking differently: Take 4 mg by mouth 3 (three) times daily as needed for nausea or vomiting. ) 20 tablet 0  . oxybutynin (DITROPAN) 5 MG tablet Take 5 mg by mouth 3 (three) times daily as needed for bladder spasms.    . Plecanatide (TRULANCE) 3 MG TABS Take 3 mg by mouth daily. 30 tablet 2  . polyethylene glycol (MIRALAX) 17 g packet Take 17 g by mouth 2 (two) times daily. 60 each 0  . predniSONE (DELTASONE) 5 MG tablet Take 1 tablet (5 mg total) by mouth daily with breakfast. 90 tablet 3  . prochlorperazine (COMPAZINE) 10 MG tablet Take 1 tablet (10 mg total) by mouth every 6 (six) hours as needed for nausea or vomiting. 30 tablet 0  . senna (SENOKOT) 8.6 MG TABS tablet Take 2 tablets by mouth daily.     . tamsulosin (FLOMAX) 0.4 MG CAPS capsule Take 1 capsule (0.4 mg total) by mouth daily after supper. (Patient taking differently: Take 0.4 mg by mouth daily as needed (to help with urination). ) 30 capsule 3  No current facility-administered medications for this visit.     Allergies:  Allergies  Allergen Reactions  . Ibuprofen Anaphylaxis  . Shrimp [Shellfish Allergy] Anaphylaxis       Physical Exam:      Blood pressure (!) 160/72, pulse 66, temperature (!) 97.3 F (36.3 C), temperature source Tympanic, resp. rate 16, height 5\' 8"  (1.727 m), weight 203 lb 4.8 oz (92.2 kg), SpO2 100 %.        ECOG: 1   General appearance: Alert, awake without any distress. Head: Atraumatic without abnormalities Oropharynx: Without any thrush or ulcers. Eyes: No scleral icterus. Lymph nodes: No lymphadenopathy noted in the cervical, supraclavicular, or axillary  nodes Heart:regular rate and rhythm, without any murmurs or gallops.   Lung: Clear to auscultation without any rhonchi, wheezes or dullness to percussion. Abdomin: Soft, nontender without any shifting dullness or ascites. Musculoskeletal: No clubbing or cyanosis. Neurological: No motor or sensory deficits. Skin: No rashes or lesions.              Lab Results: Lab Results  Component Value Date   WBC 8.3 02/14/2020   HGB 9.7 (L) 02/14/2020   HCT 29.6 (L) 02/14/2020   MCV 92.8 02/14/2020   PLT 443 (H) 02/14/2020     Chemistry      Component Value Date/Time   NA 141 02/14/2020 0755   K 3.3 (L) 02/14/2020 0755   CL 105 02/14/2020 0755   CO2 28 02/14/2020 0755   BUN 8 02/14/2020 0755   CREATININE 1.07 02/14/2020 0755      Component Value Date/Time   CALCIUM 9.1 02/14/2020 0755   ALKPHOS 65 02/14/2020 0755   AST 13 (L) 02/14/2020 0755   ALT <6 02/14/2020 0755   BILITOT 0.4 02/14/2020 0755        Results for Fandino, Jud R (MRN [id number]) as of 03/06/2020 11:22  Ref. Range 01/12/2020 08:52 01/23/2020 11:10 02/14/2020 07:55  Prostate Specific Ag, Serum Latest Ref Range: 0.0 - 4.0 ng/mL 40.3 (H) 61.9 (H) 33.4 (H)    Impression and Plan:   69 year old man with:  1.    Castration-resistant advanced prostate cancer with disease to the bone and lymphadenopathy diagnosed in December 2020.    He continues to tolerate Taxotere chemotherapy without any major complications.  His PSA did show quite a robust response after 1 cycle of therapy dropping by 50% to 33.4 from 61.9.  Risks and benefits of resuming chemotherapy at the same dose and schedule were reviewed.  Complications include nausea, vomiting, suppression among others were discussed.  He is agreeable to continue at this time.     2.  Androgen deprivation therapy: I recommended continuing this indefinitely.  Last Eligard given on February 16, 2020 and will be repeated in 4 months.  Long-term complication  occluding weight gain, hot flashes among others were reviewed.  3.  Bone directed therapy: I recommended calcium and vitamin D supplements.  Delton See will be added after obtaining dental clearance.  4.   Intractable pain: He is currently on long-acting morphine with a Dilaudid breakthrough.  5. Growth factor support: He will receive growth factor support after each cycle of therapy given his risk of neutropenia.   6.  IV access: Port-A-Cath currently in use and will be accessed  7.  Follow-up: Every 3 weeks for Taxotere chemotherapy.    30  minutes were spent on this visit.  The time was dedicated to reviewing his disease status, discussing treatment options and outlining future  plan of care.   Zola Button, MD 12/1/202111:19 AM

## 2020-03-06 NOTE — Patient Instructions (Signed)
[facility] Discharge Instructions for Patients Receiving Chemotherapy  Today you received the following chemotherapy agents: Taxotere  To help prevent nausea and vomiting after your treatment, we encourage you to take your nausea medication as directed.    If you develop nausea and vomiting that is not controlled by your nausea medication, call the clinic.   BELOW ARE SYMPTOMS THAT SHOULD BE REPORTED IMMEDIATELY:  *FEVER GREATER THAN 100.5 F  *CHILLS WITH OR WITHOUT FEVER  NAUSEA AND VOMITING THAT IS NOT CONTROLLED WITH YOUR NAUSEA MEDICATION  *UNUSUAL SHORTNESS OF BREATH  *UNUSUAL BRUISING OR BLEEDING  TENDERNESS IN MOUTH AND THROAT WITH OR WITHOUT PRESENCE OF ULCERS  *URINARY PROBLEMS  *BOWEL PROBLEMS  UNUSUAL RASH Items with * indicate a potential emergency and should be followed up as soon as possible.  Feel free to call the clinic should you have any questions or concerns. The clinic phone number is [phone].  Please show the CHEMO ALERT CARD at check-in to the Emergency Department and triage nurse.   

## 2020-03-07 ENCOUNTER — Telehealth: Payer: Self-pay | Admitting: *Deleted

## 2020-03-07 LAB — PROSTATE-SPECIFIC AG, SERUM (LABCORP): Prostate Specific Ag, Serum: 25.5 ng/mL — ABNORMAL HIGH (ref 0.0–4.0)

## 2020-03-07 NOTE — Telephone Encounter (Signed)
-----   Message from [name], MD sent at 03/07/2020 10:05 AM EST ----- Please let him know his PSA is down

## 2020-03-07 NOTE — Telephone Encounter (Signed)
Called to make pt aware of message below. Pt verbalized understanding and advised to f/u as scheduled.

## 2020-03-08 ENCOUNTER — Inpatient Hospital Stay: Payer: Medicare Other

## 2020-03-08 ENCOUNTER — Other Ambulatory Visit: Payer: Self-pay

## 2020-03-08 VITALS — BP 168/72 | HR 65 | Resp 18

## 2020-03-08 DIAGNOSIS — C61 Malignant neoplasm of prostate: Secondary | ICD-10-CM

## 2020-03-08 DIAGNOSIS — Z5111 Encounter for antineoplastic chemotherapy: Secondary | ICD-10-CM | POA: Diagnosis not present

## 2020-03-08 MED ORDER — PEGFILGRASTIM-JMDB 6 MG/0.6ML ~~LOC~~ SOSY
6.0000 mg | PREFILLED_SYRINGE | Freq: Once | SUBCUTANEOUS | Status: AC
  Administered 2020-03-08: 6 mg via SUBCUTANEOUS

## 2020-03-08 MED ORDER — PEGFILGRASTIM-JMDB 6 MG/0.6ML ~~LOC~~ SOSY
PREFILLED_SYRINGE | SUBCUTANEOUS | Status: AC
  Filled 2020-03-08: qty 0.6

## 2020-03-08 NOTE — Progress Notes (Signed)
Patient in for injection, stable upon discharge

## 2020-03-14 ENCOUNTER — Telehealth: Payer: Self-pay | Admitting: *Deleted

## 2020-03-14 NOTE — Telephone Encounter (Signed)
   08/12/2019 Connected with Vanetta Shawl [phone]) regarding CPAP form requested for pick-up.  Notified ready at front entry registation.    Also notified of referral to [place].  requested increased home care with [facility] who report Medicaid status change per [facility] H.I.M..    Awaited form though interoffice received this afternoon per 03/11/2020 e-mail from Domenick Gong , [place] H.I.M[employer] noted below.      Returning to provider nurse for any further needs.  This nurse completes FMLA and disability forms not home care requests.   [name] reports she is the caregiver and will follow up on this request.       I called [employer] spoke with rep Tammy who stated Dr. [name] office sent [employer] a referral which they was unable to be process due to [place].  Tammy stated the Medicaid status change March 06, 2020 so they now can process and Dr [name] office would need to resubmit the inform.  I will send the original request interoffice mail to [facility]  your attention on 03/12/2020.

## 2020-03-15 ENCOUNTER — Telehealth: Payer: Self-pay | Admitting: Medical Oncology

## 2020-03-15 NOTE — Telephone Encounter (Signed)
Pain worsening in lower back  down both legs L>R x  7 days.  He is applying ice to lower back and left hip.  Also his legs are weaker and he is using walker more.   - Taking - MSC 30 q 12     Dilaudid 4 mg "every 8 hours x 1 month.  Last dose at 4 am .  Tylenol 1000 mg prn  -last dose 8 am  Constipation- Partial BM wed night . He feels like he did not empty his bowel. Denies pain . He is taking his bowel meds regularly ,but has not taken them  today.  Miralax ( w prune juice) last at 6 pm yest.  Senokot and colace last night  MOM yest. morning. No results.  I recommended he take his miralax now and I will ask Shadad to advise.

## 2020-03-15 NOTE — Telephone Encounter (Signed)
[facility] cannot process PCS form . I faxed PCS form to [email or web address] which is pts managed care plan through Medicare /medicaid.Marland Kitchen

## 2020-03-15 NOTE — Telephone Encounter (Signed)
The pain increase is likely due to worsening constipation. I agree with daily Miralax and Senokot. He needs to repeat MOM today as well.

## 2020-03-15 NOTE — Telephone Encounter (Signed)
Pt.notified

## 2020-03-19 ENCOUNTER — Telehealth: Payer: Self-pay

## 2020-03-19 NOTE — Telephone Encounter (Signed)
-----   Message from [name], MD sent at 03/19/2020  3:14 PM EST ----- Fleets enema would be the next step. Please send an Rx if needed. Thanks ----- Message ----- From: [name], RN Sent: 03/19/2020   3:09 PM EST To: [name], MD  Patient called on 12/10 with c/o constipation. Per patient he tried Miralax, Senokot, Colace, Milk of Magnesia, Mag Citrate and prune juice as instructed.  He states he has not had a BM since last Wednesday.  Lanelle Bal

## 2020-03-19 NOTE — Telephone Encounter (Signed)
Called patient and let him know that per Dr. [name] the next step is a fleets enema. Patient verbalized understanding and will contact the office if the enema does not provide relief.

## 2020-03-20 ENCOUNTER — Telehealth: Payer: Self-pay | Admitting: Oncology

## 2020-03-20 NOTE — Telephone Encounter (Signed)
Scheduled per los, patient has been called and notified of upcoming appointments. 

## 2020-03-27 ENCOUNTER — Inpatient Hospital Stay (HOSPITAL_BASED_OUTPATIENT_CLINIC_OR_DEPARTMENT_OTHER): Payer: Medicare Other | Admitting: Oncology

## 2020-03-27 ENCOUNTER — Other Ambulatory Visit: Payer: Medicare Other

## 2020-03-27 ENCOUNTER — Inpatient Hospital Stay: Payer: Medicare Other

## 2020-03-27 ENCOUNTER — Other Ambulatory Visit: Payer: Self-pay

## 2020-03-27 VITALS — BP 140/69 | HR 82 | Temp 97.7°F | Resp 18 | Ht 68.0 in | Wt 203.6 lb

## 2020-03-27 DIAGNOSIS — C61 Malignant neoplasm of prostate: Secondary | ICD-10-CM

## 2020-03-27 DIAGNOSIS — Z5111 Encounter for antineoplastic chemotherapy: Secondary | ICD-10-CM | POA: Diagnosis not present

## 2020-03-27 DIAGNOSIS — Z95828 Presence of other vascular implants and grafts: Secondary | ICD-10-CM

## 2020-03-27 LAB — CBC WITH DIFFERENTIAL (CANCER CENTER ONLY)
Abs Immature Granulocytes: 0.07 10*3/uL (ref 0.00–0.07)
Basophils Absolute: 0.1 10*3/uL (ref 0.0–0.1)
Basophils Relative: 1 %
Eosinophils Absolute: 0 10*3/uL (ref 0.0–0.5)
Eosinophils Relative: 0 %
HCT: 28.2 % — ABNORMAL LOW (ref 39.0–52.0)
Hemoglobin: 8.9 g/dL — ABNORMAL LOW (ref 13.0–17.0)
Immature Granulocytes: 1 %
Lymphocytes Relative: 16 %
Lymphs Abs: 1.2 10*3/uL (ref 0.7–4.0)
MCH: 30.1 pg (ref 26.0–34.0)
MCHC: 31.6 g/dL (ref 30.0–36.0)
MCV: 95.3 fL (ref 80.0–100.0)
Monocytes Absolute: 0.8 10*3/uL (ref 0.1–1.0)
Monocytes Relative: 11 %
Neutro Abs: 5.3 10*3/uL (ref 1.7–7.7)
Neutrophils Relative %: 71 %
Platelet Count: 382 10*3/uL (ref 150–400)
RBC: 2.96 MIL/uL — ABNORMAL LOW (ref 4.22–5.81)
RDW: 16 % — ABNORMAL HIGH (ref 11.5–15.5)
WBC Count: 7.4 10*3/uL (ref 4.0–10.5)
nRBC: 0 % (ref 0.0–0.2)

## 2020-03-27 LAB — CMP (CANCER CENTER ONLY)
ALT: 10 U/L (ref 0–44)
AST: 17 U/L (ref 15–41)
Albumin: 2.8 g/dL — ABNORMAL LOW (ref 3.5–5.0)
Alkaline Phosphatase: 78 U/L (ref 38–126)
Anion gap: 5 (ref 5–15)
BUN: 9 mg/dL (ref 8–23)
CO2: 27 mmol/L (ref 22–32)
Calcium: 8.8 mg/dL — ABNORMAL LOW (ref 8.9–10.3)
Chloride: 111 mmol/L (ref 98–111)
Creatinine: 0.93 mg/dL (ref 0.61–1.24)
GFR, Estimated: >60 mL/min (ref >60)
Glucose, Bld: 108 mg/dL — ABNORMAL HIGH (ref 70–99)
Potassium: 3.8 mmol/L (ref 3.5–5.1)
Sodium: 143 mmol/L (ref 135–145)
Total Bilirubin: 0.4 mg/dL (ref 0.3–1.2)
Total Protein: 6.5 g/dL (ref 6.5–8.1)

## 2020-03-27 MED ORDER — SODIUM CHLORIDE 0.9 % IV SOLN
10.0000 mg | Freq: Once | INTRAVENOUS | Status: AC
  Administered 2020-03-27: 11:00:00 10 mg via INTRAVENOUS
  Filled 2020-03-27: qty 10

## 2020-03-27 MED ORDER — HEPARIN SOD (PORK) LOCK FLUSH 100 UNIT/ML IV SOLN
500.0000 [IU] | Freq: Once | INTRAVENOUS | Status: AC | PRN
  Administered 2020-03-27: 14:00:00 500 [IU]
  Filled 2020-03-27: qty 5

## 2020-03-27 MED ORDER — SODIUM CHLORIDE 0.9 % IV SOLN
Freq: Once | INTRAVENOUS | Status: AC
  Filled 2020-03-27: qty 250

## 2020-03-27 MED ORDER — SODIUM CHLORIDE 0.9% FLUSH
10.0000 mL | INTRAVENOUS | Status: DC | PRN
  Administered 2020-03-27: 14:00:00 10 mL
  Filled 2020-03-27: qty 10

## 2020-03-27 MED ORDER — SODIUM CHLORIDE 0.9 % IV SOLN
60.0000 mg/m2 | Freq: Once | INTRAVENOUS | Status: AC
  Administered 2020-03-27: 12:00:00 130 mg via INTRAVENOUS
  Filled 2020-03-27: qty 13

## 2020-03-27 MED ORDER — SODIUM CHLORIDE 0.9% FLUSH
10.0000 mL | INTRAVENOUS | Status: DC | PRN
  Administered 2020-03-27: 10:00:00 10 mL via INTRAVENOUS
  Filled 2020-03-27: qty 10

## 2020-03-27 NOTE — Progress Notes (Signed)
Hematology and Oncology Follow Up Visit  Andre Glass [id number] [birth year] 69 y.o. 03/27/2020 10:34 AM Andre Glass, Andre Glass, [name], DO   Principle Diagnosis: 69 year old man with advanced prostate cancer with disease to the bone and lymphadenopathy diagnosed in December 2020.  He has castration-resistant after he was found to have Gleason score 4+5 = 9 and a PSA of 366.   Prior Therapy: He is status post prostate biopsy completed on December 3 of 2020.  Pathology showed a Gleason score 4+5 = 9 with 12 out of 12 cores.  He is status post both of radiation therapy to the spine between T9 and L1.  He received 30 Gray in 10 fractions between January 15 and 28, 2021.   Firmagon 240 mg started on May 31, 2019.  Zytiga 1000 mg daily with Andre Glass 5 mg daily started in May 2021. Therapy discontinued in October of 2021 due to progression of disease.   Current therapy:   Eligard every 4 months with next injection scheduled for March 10, 2020.  Taxotere chemotherapy with 75 mg per metered squared every 3 weeks.  He is here for cycle 4 of therapy.  Interim History: Ms. Andre Glass returns today for a repeat evaluation.  Since the last visit, tolerated the last cycle of chemotherapy without any major complications.  He did report some occasional nausea but no vomiting.  He is also reporting some sensory neuropathy.  He had continues to have issues with constipation although is able to move his bowels last few days.  He continues to have chronic pain predominantly in his left back and leg but able to ambulate short distances with assistance.  He did have some falls periodically.  He is eating better and his quality of life is improving on chemotherapy.  Medications: Updated on review. Current Outpatient Medications  Medication Sig Dispense Refill  .      Andre Glass Kitchen Andre Glass (IRON PO) Take 1 tablet by mouth daily.    Andre Glass Kitchen Andre Glass (DILAUDID) 4 MG tablet Take 1 tablet (4 mg total) by mouth  every 4 (four) hours as needed for severe pain. 60 tablet 0  . Andre Glass (EMLA) cream Apply 1 application topically as needed. 30 g 0  . Andre Glass (MS CONTIN) 30 MG 12 hr tablet Take 1 tablet (30 mg total) by mouth every 8 (eight) hours. 90 tablet 0  . Andre Glass (Andre Glass) TABS tablet Take 1 tablet by mouth daily.    . Andre Glass (ZOFRAN) 4 MG tablet TAKE 1 TABLET (4 MG TOTAL) BY MOUTH 3 (THREE) TIMES DAILY. (Patient taking differently: Take 4 mg by mouth 3 (three) times daily as needed for nausea or vomiting. ) 20 tablet 0  . Andre Glass (DITROPAN) 5 MG tablet Take 5 mg by mouth 3 (three) times daily as needed for bladder spasms.    . Andre Glass (Andre Glass) 3 MG TABS Take 3 mg by mouth daily. 30 tablet 2  . Andre Glass (Andre Glass) 17 g packet Take 17 g by mouth 2 (two) times daily. 60 each 0  . Andre Glass (Andre Glass) 5 MG tablet Take 1 tablet (5 mg total) by mouth daily with breakfast. 90 tablet 3  . Andre Glass (Andre Glass) 10 MG tablet Take 1 tablet (10 mg total) by mouth every 6 (six) hours as needed for nausea or vomiting. 30 tablet 0  . Andre Glass (Andre Glass) 8.6 MG TABS tablet Take 2 tablets by mouth daily.     . Andre Glass (Andre Glass) 0.4 MG CAPS capsule Take 1 capsule (0.4 mg  total) by mouth daily after supper. (Patient taking differently: Take 0.4 mg by mouth daily as needed (to help with urination). ) 30 capsule 3   No current facility-administered medications for this visit.     Allergies:  Allergies  Allergen Reactions  . Ibuprofen Anaphylaxis  . Shrimp [Shellfish Allergy] Anaphylaxis       Physical Exam:      Blood pressure 140/69, pulse 82, temperature 97.7 F (36.5 C), temperature source Tympanic, resp. rate 18, height 5\' 8"  (1.727 m), weight 203 lb 9.6 oz (92.4 kg), SpO2 100 %.        ECOG: 1    General appearance: Comfortable appearing without any discomfort Head: Normocephalic without any trauma Oropharynx: Mucous  membranes are moist and pink without any thrush or ulcers. Eyes: Pupils are equal and round reactive to light. Lymph nodes: No cervical, supraclavicular, inguinal or axillary lymphadenopathy.   Heart:regular rate and rhythm.  S1 and S2 without leg edema. Lung: Clear without any rhonchi or wheezes.  No dullness to percussion. Abdomin: Soft, nontender, nondistended with good bowel sounds.  No hepatosplenomegaly. Musculoskeletal: No joint deformity or effusion.  Full range of motion noted. Neurological: No deficits noted on motor, sensory and deep tendon reflex exam. Skin: No petechial rash or dryness.  Appeared moist.                Lab Results: Lab Results  Component Value Date   WBC 7.4 03/27/2020   HGB 8.9 (L) 03/27/2020   HCT 28.2 (L) 03/27/2020   MCV 95.3 03/27/2020   PLT 382 03/27/2020     Chemistry      Component Value Date/Time   NA 142 03/06/2020 1145   K 3.6 03/06/2020 1145   CL 110 03/06/2020 1145   CO2 28 03/06/2020 1145   BUN 6 (L) 03/06/2020 1145   CREATININE 0.98 03/06/2020 1145      Component Value Date/Time   CALCIUM 9.0 03/06/2020 1145   ALKPHOS 78 03/06/2020 1145   AST 12 (L) 03/06/2020 1145   ALT <6 03/06/2020 1145   BILITOT 0.4 03/06/2020 1145       Results for Andre Glass, Andre Glass (MRN [id number]) as of 03/27/2020 10:36  Ref. Range 02/14/2020 07:55 03/06/2020 11:45  Prostate Specific Ag, Serum Latest Ref Range: 0.0 - 4.0 ng/mL 33.4 (H) 25.5 (H)      Impression and Plan:   69 year old man with:  1.    Advanced prostate cancer with disease to the bone and lymphadenopathy diagnosed in December 2020.  He has castration-resistant disease at this time.  He is currently on Taxotere chemotherapy with excellent PSA response with reduction of more than 50% after 2 cycles of therapy.  He is experiencing more complications including neuropathy as well as nausea and we will reduce the dose for subsequent treatments.  Risks and benefits of continuing  this approach were discussed today and he is agreeable to continue.     2.  Androgen deprivation therapy: He will continue to be on Eligard every 4 months.  This will be repeated in March 2021.  3.  Bone directed therapy: Delton See has been deferred at this time until dental clearance is complete.  No calcium or Glass D supplements.  4.   bone pain: Related to his prostate cancer.  He is currently on Andre Glass and Dilaudid with pain is manageable.  He is using the Dilaudid every 8 hours for breakthrough at this time.  5. Growth factor support: He is at risk  for neutropenia and sepsis and will receive growth factor support after each cycle of therapy.   6.  IV access: Port-A-Cath remains in place without any complications.  We will continue to use that for subsequent cycles of chemotherapy.  7.  Constipation: Related to narcotic pain medication use and malignancy.  Aggressive bowel regimen is recommended and continue to guide him through it with instructions and recommendations.  8.  Follow-up: Will be in 3 weeks for the next cycle of therapy.    30  minutes were dedicated to this encounter.  Time was dedicated to reviewing laboratory data, disease status update treatment options and addressing complications related to his cancer and cancer therapy.Zola Button, MD 12/22/202110:34 AM

## 2020-03-27 NOTE — Patient Instructions (Signed)
[facility] Discharge Instructions for Patients Receiving Chemotherapy  Today you received the following chemotherapy agents: Taxotere  To help prevent nausea and vomiting after your treatment, we encourage you to take your nausea medication as directed.    If you develop nausea and vomiting that is not controlled by your nausea medication, call the clinic.   BELOW ARE SYMPTOMS THAT SHOULD BE REPORTED IMMEDIATELY:  *FEVER GREATER THAN 100.5 F  *CHILLS WITH OR WITHOUT FEVER  NAUSEA AND VOMITING THAT IS NOT CONTROLLED WITH YOUR NAUSEA MEDICATION  *UNUSUAL SHORTNESS OF BREATH  *UNUSUAL BRUISING OR BLEEDING  TENDERNESS IN MOUTH AND THROAT WITH OR WITHOUT PRESENCE OF ULCERS  *URINARY PROBLEMS  *BOWEL PROBLEMS  UNUSUAL RASH Items with * indicate a potential emergency and should be followed up as soon as possible.  Feel free to call the clinic should you have any questions or concerns. The clinic phone number is [phone].  Please show the CHEMO ALERT CARD at check-in to the Emergency Department and triage nurse.   

## 2020-03-28 ENCOUNTER — Telehealth: Payer: Self-pay | Admitting: *Deleted

## 2020-03-28 LAB — PROSTATE-SPECIFIC AG, SERUM (LABCORP): Prostate Specific Ag, Serum: 21.4 ng/mL — ABNORMAL HIGH (ref 0.0–4.0)

## 2020-03-28 NOTE — Telephone Encounter (Signed)
Communicated PSA result to patient.  No further questions.

## 2020-03-28 NOTE — Telephone Encounter (Signed)
-----   Message from Andre Portela, MD sent at 03/28/2020  8:53 AM EST ----- Please let him know his PSA is down

## 2020-03-29 ENCOUNTER — Other Ambulatory Visit: Payer: Self-pay

## 2020-03-29 ENCOUNTER — Inpatient Hospital Stay: Payer: Medicare Other

## 2020-03-29 VITALS — BP 147/84 | HR 91 | Temp 98.1°F | Resp 17

## 2020-03-29 DIAGNOSIS — C61 Malignant neoplasm of prostate: Secondary | ICD-10-CM

## 2020-03-29 DIAGNOSIS — Z5111 Encounter for antineoplastic chemotherapy: Secondary | ICD-10-CM | POA: Diagnosis not present

## 2020-03-29 MED ORDER — PEGFILGRASTIM-JMDB 6 MG/0.6ML ~~LOC~~ SOSY
6.0000 mg | PREFILLED_SYRINGE | Freq: Once | SUBCUTANEOUS | Status: AC
  Administered 2020-03-29: 10:00:00 6 mg via SUBCUTANEOUS

## 2020-03-29 NOTE — Patient Instructions (Signed)

## 2020-04-01 ENCOUNTER — Ambulatory Visit: Payer: Medicare Other

## 2020-04-04 ENCOUNTER — Other Ambulatory Visit: Payer: Self-pay | Admitting: Oncology

## 2020-04-04 MED ORDER — HYDROMORPHONE HCL 4 MG PO TABS: 4.0000 mg | ORAL_TABLET | ORAL | 0 refills | Status: DC | PRN

## 2020-04-04 MED ORDER — MORPHINE SULFATE ER 30 MG PO TBCR: 30.0000 mg | EXTENDED_RELEASE_TABLET | Freq: Three times a day (TID) | ORAL | 0 refills | Status: DC

## 2020-04-10 ENCOUNTER — Ambulatory Visit (INDEPENDENT_AMBULATORY_CARE_PROVIDER_SITE_OTHER): Payer: Medicare Other | Admitting: Podiatry

## 2020-04-10 ENCOUNTER — Other Ambulatory Visit: Payer: Self-pay

## 2020-04-10 ENCOUNTER — Encounter: Payer: Self-pay | Admitting: Podiatry

## 2020-04-10 VITALS — BP 190/77 | HR 81 | Temp 97.3°F

## 2020-04-10 DIAGNOSIS — M79675 Pain in left toe(s): Secondary | ICD-10-CM | POA: Diagnosis not present

## 2020-04-10 DIAGNOSIS — M79674 Pain in right toe(s): Secondary | ICD-10-CM | POA: Diagnosis not present

## 2020-04-10 DIAGNOSIS — M792 Neuralgia and neuritis, unspecified: Secondary | ICD-10-CM

## 2020-04-10 DIAGNOSIS — B351 Tinea unguium: Secondary | ICD-10-CM

## 2020-04-10 NOTE — Patient Instructions (Signed)
Discuss Compounded Neuropathy Cream with your Oncologist: Compounded Neuropathy Cream from [place] for peripheral neuropathy cream which consists of: Bupivacaine 1%, doxepin 3%, gabapentin 6%, pentoxifylline 3%, and Topimarate 1%.  Call patient line at [phone] for prices of medication.    Diabetic Neuropathy Diabetic neuropathy refers to nerve damage that is caused by diabetes (diabetes mellitus). Over time, people with diabetes can develop nerve damage throughout the body. There are several types of diabetic neuropathy:  Peripheral neuropathy. This is the most common type of diabetic neuropathy. It causes damage to nerves that carry signals between the spinal cord and other parts of the body (peripheral nerves). This usually affects nerves in the feet and legs first, and may eventually affect the hands and arms. The damage affects the ability to sense touch or temperature.  Autonomic neuropathy. This type causes damage to nerves that control involuntary functions (autonomic nerves). These nerves carry signals that control: ? Heartbeat. ? Body temperature. ? Blood pressure. ? Urination. ? Digestion. ? Sweating. ? Sexual function. ? Response to changing blood sugar (glucose) levels.  Focal neuropathy. This type of nerve damage affects one area of the body, such as an arm, a leg, or the face. The injury may involve one nerve or a small group of nerves. Focal neuropathy can be painful and unpredictable, and occurs most often in older adults with diabetes. This often develops suddenly, but usually improves over time and does not cause long-term problems.  Proximal neuropathy. This type of nerve damage affects the nerves of the thighs, hips, buttocks, or legs. It causes severe pain, weakness, and muscle death (atrophy), usually in the thigh muscles. It is more common among older men and people who have type 2 diabetes. The length of recovery time may vary. What are the  causes? Peripheral, autonomic, and focal neuropathies are caused by diabetes that is not well controlled with treatment. The cause of proximal neuropathy is not known, but it may be caused by inflammation related to uncontrolled blood glucose levels. What are the signs or symptoms? Peripheral neuropathy Peripheral neuropathy develops slowly over time. When the nerves of the feet and legs no longer work, you may experience:  Burning, stabbing, or aching pain in the legs or feet.  Pain or cramping in the legs or feet.  Loss of feeling (numbness) and inability to feel pressure or pain in the feet. This can lead to: ? Thick calluses or sores on areas of constant pressure. ? Ulcers. ? Reduced ability to feel temperature changes.  Foot deformities.  Muscle weakness.  Loss of balance or coordination. Autonomic neuropathy The symptoms of autonomic neuropathy vary depending on which nerves are affected. Symptoms may include:  Problems with digestion, such as: ? Nausea or vomiting. ? Poor appetite. ? Bloating. ? Diarrhea or constipation. ? Trouble swallowing. ? Losing weight without trying to.  Problems with the heart, blood and lungs, such as: ? Dizziness, especially when standing up. ? Fainting. ? Shortness of breath. ? Irregular heartbeat.  Bladder problems, such as: ? Trouble starting or stopping urination. ? Leaking urine. ? Trouble emptying the bladder. ? Urinary tract infections (UTIs).  Problems with other body functions, such as: ? Sweat. You may sweat too much or too little. ? Temperature. You might get hot easily. Or, you might feel cold more than usual. ? Sexual function. Men may not be able to get or maintain an erection. Women may have vaginal dryness and difficulty with arousal. Focal neuropathy Symptoms affect only one area  of the body. Common symptoms include:  Numbness.  Tingling.  Burning pain.  Prickling feeling.  Very sensitive  skin.  Weakness.  Inability to move (paralysis).  Muscle twitching.  Muscles getting smaller (wasting).  Poor coordination.  Double or blurred vision. Proximal neuropathy  Sudden, severe pain in the hip, thigh, or buttocks. Pain may spread from the back into the legs (sciatica).  Pain and numbness in the arms and legs.  Tingling.  Loss of bladder control or bowel control.  Weakness and wasting of thigh muscles.  Difficulty getting up from a seated position.  Abdominal swelling.  Unexplained weight loss. How is this diagnosed? Diagnosis usually involves reviewing your medical history and any symptoms you have. Diagnosis varies depending on the type of neuropathy your health care provider suspects. Peripheral neuropathy Your health care provider will check areas that are affected by your nervous system (neurologic exam), such as your reflexes, how you move, and what you can feel. You may have other tests, such as:  Blood tests.  Removal and examination of fluid that surrounds the spinal cord (lumbar puncture).  CT scan.  MRI.  A test to check the nerves that control muscles (electromyogram, EMG).  Tests of how quickly messages pass through your nerves (nerve conduction velocity tests).  Removal of a small piece of nerve to be examined under a microscope (biopsy). Autonomic neuropathy You may have tests, such as:  Tests to measure your blood pressure and heart rate. This may include monitoring you while you are safely secured to an exam table that moves you from a lying position to an upright position (table tilt test).  Breathing tests to check your lungs.  Tests to check how food moves through the digestive system (gastric emptying tests).  Blood, sweat, or urine tests.  Ultrasound of your bladder.  Spinal fluid tests. Focal neuropathy This condition may be diagnosed with:  A neurologic exam.  CT scan.  MRI.  EMG.  Nerve conduction velocity  tests. Proximal neuropathy There is no test to diagnose this type of neuropathy. You may have tests to rule out other possible causes of this type of neuropathy. Tests may include:  X-rays of your spine and lumbar region.  Lumbar puncture.  MRI. How is this treated? The goal of treatment is to keep nerve damage from getting worse. The most important part of treatment is keeping your blood glucose level and your A1C level within your target range by following your diabetes management plan. Over time, maintaining lower blood glucose levels helps lessen symptoms. In some cases, you may need prescription pain medicine. Follow these instructions at home:  Lifestyle   Do not use any products that contain nicotine or tobacco, such as cigarettes and e-cigarettes. If you need help quitting, ask your health care provider.  Be physically active every day. Include strength training and balance exercises.  Follow a healthy meal plan.  Work with your health care provider to manage your blood pressure. General instructions  Follow your diabetes management plan as directed. ? Check your blood glucose levels as directed by your health care provider. ? Keep your blood glucose in your target range as directed by your health care provider. ? Have your A1C level checked at least two times a year, or as often as told by your health care provider.  Take over the counter and prescription medicines only as told by your health care provider. This includes insulin and diabetes medicine.  Do not drive or use heavy  machinery while taking prescription pain medicines.  Check your skin and feet every day for cuts, bruises, redness, blisters, or sores.  Keep all follow up visits as told by your health care provider. This is important. Contact a health care provider if:  You have burning, stabbing, or aching pain in your legs or feet.  You are unable to feel pressure or pain in your feet.  You develop  problems with digestion, such as: ? Nausea. ? Vomiting. ? Bloating. ? Constipation. ? Diarrhea. ? Abdominal pain.  You have difficulty with urination, such as inability: ? To control when you urinate (incontinence). ? To completely empty the bladder (retention).  You have palpitations.  You feel dizzy, weak, or faint when you stand up. Get help right away if:  You cannot urinate.  You have sudden weakness or loss of coordination.  You have trouble speaking.  You have pain or pressure in your chest.  You have an irregular heart beat.  You have sudden inability to move a part of your body. Summary  Diabetic neuropathy refers to nerve damage that is caused by diabetes. It can affect nerves throughout the entire body, causing numbness and pain in the arms, legs, digestive tract, heart, and other body systems.  Keep your blood glucose level and your blood pressure in your target range, as directed by your health care provider. This can help prevent neuropathy from getting worse.  Check your skin and feet every day for cuts, bruises, redness, blisters, or sores.  Do not use any products that contain nicotine or tobacco, such as cigarettes and e-cigarettes. If you need help quitting, ask your health care provider. This information is not intended to replace advice given to you by your health care provider. Make sure you discuss any questions you have with your health care provider. Document Revised: 05/05/2017 Document Reviewed: 04/27/2016 Elsevier Patient Education  2020 [employer].

## 2020-04-11 NOTE — Progress Notes (Signed)
Subjective: Andre Glass presents today referred by [name], DO for complaint of painful thick toenails that are difficult to trim. Pain interferes with ambulation. Aggravating factors include wearing enclosed shoe gear. Pain is relieved with periodic professional debridement.   Duration is several months and patient cannot trim his toenails. He has chronic pain due to prostate cancer metastasis to spine.    Past Medical History:  Diagnosis Date  . Anemia   . History of kidney stones   . Prostate cancer ([place])   . UTI (urinary tract infection)      Patient Active Problem List   Diagnosis Date Noted  . Intractable pain 01/18/2020  . BPH (benign prostatic hyperplasia) 11/08/2019  . Catheter-associated urinary tract infection ([place]) 09/14/2019  . Drug induced constipation 07/18/2019  . Goals of care, counseling/discussion 06/26/2019  . 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 Malignant neoplasm of prostate ([place]) 04/19/2019     Past Surgical History:  Procedure Laterality Date  . IR IMAGING GUIDED PORT INSERTION  01/31/2020  . TRANSURETHRAL RESECTION OF PROSTATE N/A 11/08/2019   Procedure: TRANSURETHRAL RESECTION OF THE PROSTATE (TURP);  Surgeon: [name], MD;  Location: [facility] ORS;  Service: Urology;  Laterality: N/A;     Current Outpatient Medications on File Prior to Visit  Medication Sig Dispense Refill  . abiraterone acetate (ZYTIGA) 250 MG tablet Take 4 tablets (1,000 mg total) by mouth daily. Take on an empty stomach 1 hour before or 2 hours after a meal 120 tablet 0  . Ferrous Sulfate (IRON PO) Take 1 tablet by mouth daily.    Marland Kitchen HYDROmorphone (DILAUDID) 4 MG tablet Take 1 tablet (4 mg total) by mouth every 4 (four) hours as needed for severe pain. 60 tablet 0  . lidocaine-prilocaine (EMLA) cream Apply 1 application topically as needed. 30 g 0  . morphine (MS CONTIN) 30 MG 12 hr tablet Take 1 tablet (30 mg total) by mouth every 8 (eight) hours. 90 tablet 0  . Multiple  Vitamin (MULTIVITAMIN WITH MINERALS) TABS tablet Take 1 tablet by mouth daily.    . ondansetron (ZOFRAN) 4 MG tablet TAKE 1 TABLET (4 MG TOTAL) BY MOUTH 3 (THREE) TIMES DAILY. (Patient taking differently: Take 4 mg by mouth 3 (three) times daily as needed for nausea or vomiting.) 20 tablet 0  . oxybutynin (DITROPAN) 5 MG tablet Take 5 mg by mouth 3 (three) times daily as needed for bladder spasms.    . Plecanatide (TRULANCE) 3 MG TABS Take 3 mg by mouth daily. 30 tablet 2  . polyethylene glycol (MIRALAX) 17 g packet Take 17 g by mouth 2 (two) times daily. 60 each 0  . predniSONE (DELTASONE) 5 MG tablet Take 1 tablet (5 mg total) by mouth daily with breakfast. 90 tablet 3  . prochlorperazine (COMPAZINE) 10 MG tablet Take 1 tablet (10 mg total) by mouth every 6 (six) hours as needed for nausea or vomiting. 30 tablet 0  . senna (SENOKOT) 8.6 MG TABS tablet Take 2 tablets by mouth daily.    . tamsulosin (FLOMAX) 0.4 MG CAPS capsule Take 1 capsule (0.4 mg total) by mouth daily after supper. (Patient taking differently: Take 0.4 mg by mouth daily as needed (to help with urination).) 30 capsule 3   No current facility-administered medications on file prior to visit.     Allergies  Allergen Reactions  . Ibuprofen Anaphylaxis  . Shrimp [Shellfish Allergy] Anaphylaxis     Social History   Occupational History  .  Not on file  Tobacco Use  . Smoking status: Never Smoker  . Smokeless tobacco: Never Used  Vaping Use  . Vaping Use: Never used  Substance and Sexual Activity  . Alcohol use: Never  . Drug use: Never  . Sexual activity: Not Currently     Family History  Problem Relation Age of Onset  . Stomach cancer Mother   . Pancreatic cancer Sister   . Breast cancer Neg Hx   . Prostate cancer Neg Hx   . Colon cancer Neg Hx      Immunization History  Administered Date(s) Administered  . Fluad Quad(high Dose 65+) 01/24/2020  . PFIZER SARS-COV-2 Vaccination 12/06/2019  . Pneumococcal  Polysaccharide-23 11/09/2019     Objective: Andre Glass is a pleasant 70 y.o. male frail, in NAD. AAO x 3.  Vitals:   04/10/20 1153  BP: (!) 190/77  Pulse: 81  Temp: (!) 97.3 F (36.3 C)    Vascular Examination:  Capillary refill time to digits immediate b/l. Palpable pedal pulses b/l LE. Pedal hair sparse. Lower extremity skin temperature gradient within normal limits. No pain with calf compression b/l.  Dermatological Examination: Pedal skin with normal turgor, texture and tone bilaterally. No open wounds bilaterally. No interdigital macerations bilaterally. Toenails 1-5 b/l elongated, discolored, dystrophic, thickened, crumbly with subungual debris and tenderness to dorsal palpation. Pedal skin noted to be dry and flaky b/l lower extremities.  Musculoskeletal: Normal muscle strength 5/5 to all lower extremity muscle groups bilaterally. No pain crepitus or joint limitation noted with ROM b/l. Hallux valgus with bunion deformity noted b/l lower extremities.  Neurological: Pt has subjective symptoms of neuropathy. Protective sensation intact 5/5 intact bilaterally with 10g monofilament b/l. Vibratory sensation intact b/l.  Assessment: 1. Pain due to onychomycosis of toenails of both feet   2. Neuropathic pain   Plan: -Examined patient. -Patient to continue soft, supportive shoe gear daily. -Toenails 1-5 b/l were debrided in length and girth with sterile nail nippers and dremel without iatrogenic bleeding.  -Discussed using Aquaphor Ointment daily to feet.  -Patient to report any pedal injuries to medical professional immediately. -Discussed neuropathy with patient and provider, [name]. Discussed topical treatment via compounded neuropathy cream from [place]. I did give him the ingredients of the compounded medication via printout so he can take and discuss with his Oncologist.  -Patient/POA to call should there be question/concern in the interim.  Return in about  3 months (around 07/09/2020).  Marzetta Board, DPM

## 2020-04-15 ENCOUNTER — Telehealth: Payer: Self-pay | Admitting: Oncology

## 2020-04-15 NOTE — Telephone Encounter (Signed)
Called patient regarding upcoming appointments on 01/12, patient has been called and voicemail was left.

## 2020-04-17 ENCOUNTER — Other Ambulatory Visit: Payer: Self-pay

## 2020-04-17 ENCOUNTER — Inpatient Hospital Stay: Payer: Medicare Other

## 2020-04-17 ENCOUNTER — Inpatient Hospital Stay: Payer: Medicare Other | Attending: Oncology

## 2020-04-17 ENCOUNTER — Inpatient Hospital Stay (HOSPITAL_BASED_OUTPATIENT_CLINIC_OR_DEPARTMENT_OTHER): Payer: Medicare Other | Admitting: Oncology

## 2020-04-17 VITALS — BP 155/77 | HR 66 | Temp 98.1°F | Resp 18 | Ht 68.0 in | Wt 203.3 lb

## 2020-04-17 DIAGNOSIS — Z79899 Other long term (current) drug therapy: Secondary | ICD-10-CM | POA: Diagnosis not present

## 2020-04-17 DIAGNOSIS — K5900 Constipation, unspecified: Secondary | ICD-10-CM | POA: Diagnosis not present

## 2020-04-17 DIAGNOSIS — Z95828 Presence of other vascular implants and grafts: Secondary | ICD-10-CM

## 2020-04-17 DIAGNOSIS — Z79818 Long term (current) use of other agents affecting estrogen receptors and estrogen levels: Secondary | ICD-10-CM | POA: Insufficient documentation

## 2020-04-17 DIAGNOSIS — R590 Localized enlarged lymph nodes: Secondary | ICD-10-CM | POA: Insufficient documentation

## 2020-04-17 DIAGNOSIS — C7951 Secondary malignant neoplasm of bone: Secondary | ICD-10-CM | POA: Insufficient documentation

## 2020-04-17 DIAGNOSIS — Z5189 Encounter for other specified aftercare: Secondary | ICD-10-CM | POA: Insufficient documentation

## 2020-04-17 DIAGNOSIS — Z5111 Encounter for antineoplastic chemotherapy: Secondary | ICD-10-CM | POA: Insufficient documentation

## 2020-04-17 DIAGNOSIS — Z7952 Long term (current) use of systemic steroids: Secondary | ICD-10-CM | POA: Insufficient documentation

## 2020-04-17 DIAGNOSIS — C61 Malignant neoplasm of prostate: Secondary | ICD-10-CM

## 2020-04-17 DIAGNOSIS — Z923 Personal history of irradiation: Secondary | ICD-10-CM | POA: Diagnosis not present

## 2020-04-17 LAB — CMP (CANCER CENTER ONLY)
ALT: <6 U/L (ref 0–44)
AST: 15 U/L (ref 15–41)
Albumin: 2.5 g/dL — ABNORMAL LOW (ref 3.5–5.0)
Alkaline Phosphatase: 64 U/L (ref 38–126)
Anion gap: 5 (ref 5–15)
BUN: 7 mg/dL — ABNORMAL LOW (ref 8–23)
CO2: 28 mmol/L (ref 22–32)
Calcium: 8.6 mg/dL — ABNORMAL LOW (ref 8.9–10.3)
Chloride: 107 mmol/L (ref 98–111)
Creatinine: 0.82 mg/dL (ref 0.61–1.24)
GFR, Estimated: >60 mL/min (ref >60)
Glucose, Bld: 92 mg/dL (ref 70–99)
Potassium: 3.6 mmol/L (ref 3.5–5.1)
Sodium: 140 mmol/L (ref 135–145)
Total Bilirubin: 0.3 mg/dL (ref 0.3–1.2)
Total Protein: 6.6 g/dL (ref 6.5–8.1)

## 2020-04-17 LAB — CBC WITH DIFFERENTIAL (CANCER CENTER ONLY)
Abs Immature Granulocytes: 0.04 10*3/uL (ref 0.00–0.07)
Basophils Absolute: 0 10*3/uL (ref 0.0–0.1)
Basophils Relative: 1 %
Eosinophils Absolute: 0 10*3/uL (ref 0.0–0.5)
Eosinophils Relative: 0 %
HCT: 25.4 % — ABNORMAL LOW (ref 39.0–52.0)
Hemoglobin: 7.9 g/dL — ABNORMAL LOW (ref 13.0–17.0)
Immature Granulocytes: 1 %
Lymphocytes Relative: 18 %
Lymphs Abs: 1.3 10*3/uL (ref 0.7–4.0)
MCH: 30.3 pg (ref 26.0–34.0)
MCHC: 31.1 g/dL (ref 30.0–36.0)
MCV: 97.3 fL (ref 80.0–100.0)
Monocytes Absolute: 0.6 10*3/uL (ref 0.1–1.0)
Monocytes Relative: 9 %
Neutro Abs: 5.2 10*3/uL (ref 1.7–7.7)
Neutrophils Relative %: 71 %
Platelet Count: 333 10*3/uL (ref 150–400)
RBC: 2.61 MIL/uL — ABNORMAL LOW (ref 4.22–5.81)
RDW: 16 % — ABNORMAL HIGH (ref 11.5–15.5)
WBC Count: 7.3 10*3/uL (ref 4.0–10.5)
nRBC: 0 % (ref 0.0–0.2)

## 2020-04-17 MED ORDER — SODIUM CHLORIDE 0.9 % IV SOLN
60.0000 mg/m2 | Freq: Once | INTRAVENOUS | Status: AC
  Administered 2020-04-17: 130 mg via INTRAVENOUS
  Filled 2020-04-17: qty 13

## 2020-04-17 MED ORDER — SODIUM CHLORIDE 0.9% FLUSH
10.0000 mL | Freq: Once | INTRAVENOUS | Status: AC
  Administered 2020-04-17: 10 mL via INTRAVENOUS
  Filled 2020-04-17: qty 10

## 2020-04-17 MED ORDER — SODIUM CHLORIDE 0.9 % IV SOLN
Freq: Once | INTRAVENOUS | Status: AC
  Filled 2020-04-17: qty 250

## 2020-04-17 MED ORDER — SODIUM CHLORIDE 0.9% FLUSH
10.0000 mL | INTRAVENOUS | Status: DC | PRN
  Administered 2020-04-17: 10 mL
  Filled 2020-04-17: qty 10

## 2020-04-17 MED ORDER — HEPARIN SOD (PORK) LOCK FLUSH 100 UNIT/ML IV SOLN
500.0000 [IU] | Freq: Once | INTRAVENOUS | Status: AC | PRN
  Administered 2020-04-17: 500 [IU]
  Filled 2020-04-17: qty 5

## 2020-04-17 MED ORDER — SODIUM CHLORIDE 0.9 % IV SOLN
10.0000 mg | Freq: Once | INTRAVENOUS | Status: AC
  Administered 2020-04-17: 10 mg via INTRAVENOUS
  Filled 2020-04-17: qty 10

## 2020-04-17 NOTE — Patient Instructions (Addendum)

## 2020-04-17 NOTE — Progress Notes (Signed)
OK to treat per Dr. [name] with today's Hgb of 7.9

## 2020-04-17 NOTE — Progress Notes (Signed)
Hematology and Oncology Follow Up Visit  Andre Glass [id number] 07/23/1950 70 y.o. 04/17/2020 12:13 PM Andre Glass, Andre Glass, [name], DO   Principle Diagnosis: 70 year old man with castration-resistant advanced prostate cancer with disease to the bone diagnosed in December 2020.  He was found to have Gleason score 4+5 = 9 and a PSA of 366 at the time of diagnosis.   Prior Therapy: He is status post prostate biopsy completed on December 3 of 2020.  Pathology showed a Gleason score 4+5 = 9 with 12 out of 12 cores.  He is status post both of radiation therapy to the spine between T9 and L1.  He received 30 Gray in 10 fractions between January 15 and 28, 2021.   Firmagon 240 mg started on May 31, 2019.  Zytiga 1000 mg daily with prednisone 5 mg daily started in May 2021. Therapy discontinued in October of 2021 due to progression of disease.   Current therapy:   Eligard every 4 months with next injection will be given in March 2022.  Taxotere chemotherapy with 75 mg per metered squared every 3 weeks started in October 2021.  The dose was reduced to 60 mg per metered square after 3 cycles.  He is here for cycle 5 of therapy.  Interim History: Andre Glass is here for a follow-up evaluation.  He reports no major changes in his health.  He continues to tolerate chemotherapy without any major complaints.  Denies any nausea, vomiting or abdominal pain.  Denies any weight loss or appetite changes.  Continues to have mild sensory neuropathy that is manageable at this time.  His bone pain predominantly on the left pelvic side and leg remains manageable with Dilaudid and morphine.  He is having more regular bowel movements at this time.  His performance status is limited but overall maintained without any further decline.  Medications: Unchanged on review. Current Outpatient Medications  Medication Sig Dispense Refill  .      Marland Kitchen Ferrous Sulfate (IRON PO) Take 1 tablet by mouth daily.    Marland Kitchen  HYDROmorphone (DILAUDID) 4 MG tablet Take 1 tablet (4 mg total) by mouth every 4 (four) hours as needed for severe pain. 60 tablet 0  . lidocaine-prilocaine (EMLA) cream Apply 1 application topically as needed. 30 g 0  . morphine (MS CONTIN) 30 MG 12 hr tablet Take 1 tablet (30 mg total) by mouth every 8 (eight) hours. 90 tablet 0  . Multiple Vitamin (MULTIVITAMIN WITH MINERALS) TABS tablet Take 1 tablet by mouth daily.    . ondansetron (ZOFRAN) 4 MG tablet TAKE 1 TABLET (4 MG TOTAL) BY MOUTH 3 (THREE) TIMES DAILY. (Patient taking differently: Take 4 mg by mouth 3 (three) times daily as needed for nausea or vomiting. ) 20 tablet 0  . oxybutynin (DITROPAN) 5 MG tablet Take 5 mg by mouth 3 (three) times daily as needed for bladder spasms.    . Plecanatide (TRULANCE) 3 MG TABS Take 3 mg by mouth daily. 30 tablet 2  . polyethylene glycol (MIRALAX) 17 g packet Take 17 g by mouth 2 (two) times daily. 60 each 0  . predniSONE (DELTASONE) 5 MG tablet Take 1 tablet (5 mg total) by mouth daily with breakfast. 90 tablet 3  . prochlorperazine (COMPAZINE) 10 MG tablet Take 1 tablet (10 mg total) by mouth every 6 (six) hours as needed for nausea or vomiting. 30 tablet 0  . senna (SENOKOT) 8.6 MG TABS tablet Take 2 tablets by mouth daily.     Marland Kitchen  tamsulosin (FLOMAX) 0.4 MG CAPS capsule Take 1 capsule (0.4 mg total) by mouth daily after supper. (Patient taking differently: Take 0.4 mg by mouth daily as needed (to help with urination). ) 30 capsule 3   No current facility-administered medications for this visit.     Allergies:  Allergies  Allergen Reactions  . Ibuprofen Anaphylaxis  . Shrimp [Shellfish Allergy] Anaphylaxis       Physical Exam:          Blood pressure (!) 155/77, pulse 66, temperature 98.1 F (36.7 C), temperature source Tympanic, resp. rate 18, height 5\' 8"  (1.727 m), weight 203 lb 4.8 oz (92.2 kg), SpO2 99 %.    ECOG: 1   General appearance: Alert, awake without any  distress. Head: Atraumatic without abnormalities Oropharynx: Without any thrush or ulcers. Eyes: No scleral icterus. Lymph nodes: No lymphadenopathy noted in the cervical, supraclavicular, or axillary nodes Heart:regular rate and rhythm, without any murmurs or gallops.   Lung: Clear to auscultation without any rhonchi, wheezes or dullness to percussion. Abdomin: Soft, nontender without any shifting dullness or ascites. Musculoskeletal: No clubbing or cyanosis. Neurological: No motor or sensory deficits. Skin: No rashes or lesions.                Lab Results: Lab Results  Component Value Date   WBC 7.4 03/27/2020   HGB 8.9 (L) 03/27/2020   HCT 28.2 (L) 03/27/2020   MCV 95.3 03/27/2020   PLT 382 03/27/2020     Chemistry      Component Value Date/Time   NA 143 03/27/2020 1017   K 3.8 03/27/2020 1017   CL 111 03/27/2020 1017   CO2 27 03/27/2020 1017   BUN 9 03/27/2020 1017   CREATININE 0.93 03/27/2020 1017      Component Value Date/Time   CALCIUM 8.8 (L) 03/27/2020 1017   ALKPHOS 78 03/27/2020 1017   AST 17 03/27/2020 1017   ALT 10 03/27/2020 1017   BILITOT 0.4 03/27/2020 1017         Results for Andre Glass, Andre Glass (MRN [id number]) as of 04/17/2020 12:17  Ref. Range 02/14/2020 07:55 03/06/2020 11:45 03/27/2020 10:17  Prostate Specific Ag, Serum Latest Ref Range: 0.0 - 4.0 ng/mL 33.4 (H) 25.5 (H) 21.4 (H)     Impression and Plan:   70 year old man with:  1.    Castration-resistant advanced prostate cancer with disease to the bone and lymphadenopathy diagnosed in December 2020.    He has tolerated Taxotere chemotherapy without any major complaints.  His PSA did show steady decline at this time since the start of chemotherapy with over 50% decline currently.  Risks and benefits of continuing this treatment were discussed at this time.  Potential complications include nausea, vomiting, suppression and worsening neuropathy were reiterated.  He is agreeable to  proceed at this time.  The duration of therapy was discussed today in detail.  He is experiencing more sensory neuropathy but manageable at this time.  Potentially 7-10 cycles could be given.     2.  Androgen deprivation therapy: Long-term complications associated with Eligard were reviewed.  These include weight gain, hot flashes among others were reviewed and discussed.  Next injection will be in March 2022.  3.  Bone directed therapy: I recommended calcium and vitamin D supplements.  Xgeva deferred till he has dental clearance.  4.  Bone pain: He is currently on morphine and Dilaudid.  His pain is related to metastatic prostate cancer to the bone.  5. Growth  factor support: This will be required after each cycle of therapy.  He is at risk of neutropenia and sepsis.   6.  IV access: Port-A-Cath currently in place without any issues.  7.  Constipation: We continue to discuss strategies to improve his bowel habits.  Bowel habits are more regular at this time.  8.  Follow-up: In 3 weeks and acceptable therapy.    30  minutes were spent on this visit.  The time was dedicated to reviewing disease status, treatment options and complications related to cancer and cancer therapy.   Zola Button, MD 1/12/202212:13 PM

## 2020-04-17 NOTE — Patient Instructions (Signed)
[facility] Discharge Instructions for Patients Receiving Chemotherapy  Today you received the following chemotherapy agents: Taxotere  To help prevent nausea and vomiting after your treatment, we encourage you to take your nausea medication as directed.    If you develop nausea and vomiting that is not controlled by your nausea medication, call the clinic.   BELOW ARE SYMPTOMS THAT SHOULD BE REPORTED IMMEDIATELY:  *FEVER GREATER THAN 100.5 F  *CHILLS WITH OR WITHOUT FEVER  NAUSEA AND VOMITING THAT IS NOT CONTROLLED WITH YOUR NAUSEA MEDICATION  *UNUSUAL SHORTNESS OF BREATH  *UNUSUAL BRUISING OR BLEEDING  TENDERNESS IN MOUTH AND THROAT WITH OR WITHOUT PRESENCE OF ULCERS  *URINARY PROBLEMS  *BOWEL PROBLEMS  UNUSUAL RASH Items with * indicate a potential emergency and should be followed up as soon as possible.  Feel free to call the clinic should you have any questions or concerns. The clinic phone number is [phone].  Please show the CHEMO ALERT CARD at check-in to the Emergency Department and triage nurse.   

## 2020-04-18 LAB — PROSTATE-SPECIFIC AG, SERUM (LABCORP): Prostate Specific Ag, Serum: 24.7 ng/mL — ABNORMAL HIGH (ref 0.0–4.0)

## 2020-04-19 ENCOUNTER — Inpatient Hospital Stay: Payer: Medicare Other

## 2020-04-19 ENCOUNTER — Other Ambulatory Visit: Payer: Self-pay

## 2020-04-19 ENCOUNTER — Telehealth: Payer: Self-pay

## 2020-04-19 VITALS — BP 117/70 | HR 77 | Temp 98.8°F | Resp 18

## 2020-04-19 DIAGNOSIS — C61 Malignant neoplasm of prostate: Secondary | ICD-10-CM

## 2020-04-19 DIAGNOSIS — Z5111 Encounter for antineoplastic chemotherapy: Secondary | ICD-10-CM | POA: Diagnosis not present

## 2020-04-19 MED ORDER — PEGFILGRASTIM-JMDB 6 MG/0.6ML ~~LOC~~ SOSY
PREFILLED_SYRINGE | SUBCUTANEOUS | Status: AC
  Filled 2020-04-19: qty 0.6

## 2020-04-19 MED ORDER — PEGFILGRASTIM-JMDB 6 MG/0.6ML ~~LOC~~ SOSY
6.0000 mg | PREFILLED_SYRINGE | Freq: Once | SUBCUTANEOUS | Status: AC
  Administered 2020-04-19: 6 mg via SUBCUTANEOUS

## 2020-04-19 NOTE — Patient Instructions (Signed)
Pegfilgrastim injection What is this medicine? PEGFILGRASTIM (PEG fil gra stim) is a long-acting granulocyte colony-stimulating factor that stimulates the growth of neutrophils, a type of white blood cell important in the body's fight against infection. It is used to reduce the incidence of fever and infection in patients with certain types of cancer who are receiving chemotherapy that affects the bone marrow, and to increase survival after being exposed to high doses of radiation. This medicine may be used for other purposes; ask your health care provider or pharmacist if you have questions. COMMON BRAND NAME(S): Fulphila, Neulasta, Nyvepria, UDENYCA, Ziextenzo What should I tell my health care provider before I take this medicine? They need to know if you have any of these conditions:  kidney disease  latex allergy  ongoing radiation therapy  sickle cell disease  skin reactions to acrylic adhesives (On-Body Injector only)  an unusual or allergic reaction to pegfilgrastim, filgrastim, other medicines, foods, dyes, or preservatives  pregnant or trying to get pregnant  breast-feeding How should I use this medicine? This medicine is for injection under the skin. If you get this medicine at home, you will be taught how to prepare and give the pre-filled syringe or how to use the On-body Injector. Refer to the patient Instructions for Use for detailed instructions. Use exactly as directed. Tell your healthcare provider immediately if you suspect that the On-body Injector may not have performed as intended or if you suspect the use of the On-body Injector resulted in a missed or partial dose. It is important that you put your used needles and syringes in a special sharps container. Do not put them in a trash can. If you do not have a sharps container, call your pharmacist or healthcare provider to get one. Talk to your pediatrician regarding the use of this medicine in children. While this drug  may be prescribed for selected conditions, precautions do apply. Overdosage: If you think you have taken too much of this medicine contact a poison control center or emergency room at once. NOTE: This medicine is only for you. Do not share this medicine with others. What if I miss a dose? It is important not to miss your dose. Call your doctor or health care professional if you miss your dose. If you miss a dose due to an On-body Injector failure or leakage, a new dose should be administered as soon as possible using a single prefilled syringe for manual use. What may interact with this medicine? Interactions have not been studied. This list may not describe all possible interactions. Give your health care provider a list of all the medicines, herbs, non-prescription drugs, or dietary supplements you use. Also tell them if you smoke, drink alcohol, or use illegal drugs. Some items may interact with your medicine. What should I watch for while using this medicine? Your condition will be monitored carefully while you are receiving this medicine. You may need blood work done while you are taking this medicine. Talk to your health care provider about your risk of cancer. You may be more at risk for certain types of cancer if you take this medicine. If you are going to need a MRI, CT scan, or other procedure, tell your doctor that you are using this medicine (On-Body Injector only). What side effects may I notice from receiving this medicine? Side effects that you should report to your doctor or health care professional as soon as possible:  allergic reactions (skin rash, itching or hives, swelling of   the face, lips, or tongue)  back pain  dizziness  fever  pain, redness, or irritation at site where injected  pinpoint red spots on the skin  red or dark-brown urine  shortness of breath or breathing problems  stomach or side pain, or pain at the shoulder  swelling  tiredness  trouble  passing urine or change in the amount of urine  unusual bruising or bleeding Side effects that usually do not require medical attention (report to your doctor or health care professional if they continue or are bothersome):  bone pain  muscle pain This list may not describe all possible side effects. Call your doctor for medical advice about side effects. You may report side effects to FDA at 1-800-FDA-1088. Where should I keep my medicine? Keep out of the reach of children. If you are using this medicine at home, you will be instructed on how to store it. Throw away any unused medicine after the expiration date on the label. NOTE: This sheet is a summary. It may not cover all possible information. If you have questions about this medicine, talk to your doctor, pharmacist, or health care provider.  2021 Elsevier/Gold Standard (2019-04-14 13:20:51)  

## 2020-04-19 NOTE — Telephone Encounter (Signed)
Home Medical Equipment prescription faxed to [place] for a lightweight wheelchair per patient's request. Confirmation fax received.

## 2020-04-23 ENCOUNTER — Telehealth: Payer: Self-pay | Admitting: *Deleted

## 2020-04-23 ENCOUNTER — Telehealth (INDEPENDENT_AMBULATORY_CARE_PROVIDER_SITE_OTHER): Payer: Medicare Other | Admitting: Podiatry

## 2020-04-23 ENCOUNTER — Telehealth: Payer: Self-pay | Admitting: Podiatry

## 2020-04-23 NOTE — Telephone Encounter (Signed)
Phoned patient and spoke to caregiver. Caregiver states PCP approved Rx for compounded neuropathy cream from [place]. They would like largest size available. Informed her pricing/payment would need to be arranged with pharmacy. She related understanding. Informed her we will fax Rx on tomorrow.

## 2020-04-23 NOTE — Telephone Encounter (Signed)
Patient spoke to his primary care and stated that its ok for him to be prescribed the neuropathy cream. Patient is requesting the larger tube. Please call patient

## 2020-04-23 NOTE — Telephone Encounter (Signed)
Patient;s wife is calling to inform that PCP has given the ok to order the  Neuropathy cream.

## 2020-04-24 ENCOUNTER — Other Ambulatory Visit: Payer: Self-pay

## 2020-04-24 MED ORDER — NONFORMULARY OR COMPOUNDED ITEM: 1.0000 g | Freq: Four times a day (QID) | 2 refills | Status: DC

## 2020-05-03 ENCOUNTER — Telehealth: Payer: Self-pay

## 2020-05-03 NOTE — Telephone Encounter (Signed)
-----   Message from [name], MD sent at 05/03/2020  2:56 PM EST ----- Please instruct him to keep his feet elevated and I will evaluate on Tuesday.  Thanks ----- Message ----- From: [name], RN Sent: 05/03/2020   2:53 PM EST To: [name], MD  Patient called and wants to make you aware he has lower extremity edema over the past week. He said it has gotten worse over the past 2 days to the point where it is difficult to put on shoes. He denies shortness of breath or pain anywhere. He has also been elevating his legs. He as an appointment with you on Tuesday. Lanelle Bal

## 2020-05-03 NOTE — Telephone Encounter (Signed)
Called patient and made him aware to continue elevating his feet and Dr. [name] will evaluate at appointment on Tuesday.

## 2020-05-06 MED FILL — Dexamethasone Sodium Phosphate Inj 100 MG/10ML: INTRAMUSCULAR | Qty: 1 | Status: AC

## 2020-05-07 ENCOUNTER — Inpatient Hospital Stay: Payer: Medicare Other

## 2020-05-07 ENCOUNTER — Inpatient Hospital Stay (HOSPITAL_BASED_OUTPATIENT_CLINIC_OR_DEPARTMENT_OTHER): Payer: Medicare Other | Admitting: Oncology

## 2020-05-07 ENCOUNTER — Other Ambulatory Visit: Payer: Self-pay

## 2020-05-07 ENCOUNTER — Other Ambulatory Visit: Payer: Self-pay | Admitting: *Deleted

## 2020-05-07 ENCOUNTER — Inpatient Hospital Stay: Payer: Medicare Other | Attending: Oncology

## 2020-05-07 VITALS — BP 155/75 | HR 69 | Temp 97.8°F | Resp 20 | Ht 68.0 in | Wt 215.6 lb

## 2020-05-07 DIAGNOSIS — Z79899 Other long term (current) drug therapy: Secondary | ICD-10-CM | POA: Insufficient documentation

## 2020-05-07 DIAGNOSIS — Z95828 Presence of other vascular implants and grafts: Secondary | ICD-10-CM

## 2020-05-07 DIAGNOSIS — C61 Malignant neoplasm of prostate: Secondary | ICD-10-CM | POA: Insufficient documentation

## 2020-05-07 DIAGNOSIS — Z923 Personal history of irradiation: Secondary | ICD-10-CM | POA: Diagnosis not present

## 2020-05-07 DIAGNOSIS — C7951 Secondary malignant neoplasm of bone: Secondary | ICD-10-CM | POA: Insufficient documentation

## 2020-05-07 DIAGNOSIS — G629 Polyneuropathy, unspecified: Secondary | ICD-10-CM | POA: Diagnosis not present

## 2020-05-07 DIAGNOSIS — K5900 Constipation, unspecified: Secondary | ICD-10-CM | POA: Diagnosis not present

## 2020-05-07 DIAGNOSIS — Z7952 Long term (current) use of systemic steroids: Secondary | ICD-10-CM | POA: Insufficient documentation

## 2020-05-07 DIAGNOSIS — R600 Localized edema: Secondary | ICD-10-CM | POA: Insufficient documentation

## 2020-05-07 DIAGNOSIS — R5383 Other fatigue: Secondary | ICD-10-CM | POA: Insufficient documentation

## 2020-05-07 LAB — CBC WITH DIFFERENTIAL (CANCER CENTER ONLY)
Abs Immature Granulocytes: 0.09 10*3/uL — ABNORMAL HIGH (ref 0.00–0.07)
Basophils Absolute: 0 10*3/uL (ref 0.0–0.1)
Basophils Relative: 1 %
Eosinophils Absolute: 0 10*3/uL (ref 0.0–0.5)
Eosinophils Relative: 0 %
HCT: 28 % — ABNORMAL LOW (ref 39.0–52.0)
Hemoglobin: 8.5 g/dL — ABNORMAL LOW (ref 13.0–17.0)
Immature Granulocytes: 1 %
Lymphocytes Relative: 20 %
Lymphs Abs: 1.7 10*3/uL (ref 0.7–4.0)
MCH: 30.5 pg (ref 26.0–34.0)
MCHC: 30.4 g/dL (ref 30.0–36.0)
MCV: 100.4 fL — ABNORMAL HIGH (ref 80.0–100.0)
Monocytes Absolute: 0.9 10*3/uL (ref 0.1–1.0)
Monocytes Relative: 10 %
Neutro Abs: 5.7 10*3/uL (ref 1.7–7.7)
Neutrophils Relative %: 68 %
Platelet Count: 235 10*3/uL (ref 150–400)
RBC: 2.79 MIL/uL — ABNORMAL LOW (ref 4.22–5.81)
RDW: 18.5 % — ABNORMAL HIGH (ref 11.5–15.5)
WBC Count: 8.4 10*3/uL (ref 4.0–10.5)
nRBC: 0.6 % — ABNORMAL HIGH (ref 0.0–0.2)

## 2020-05-07 LAB — CMP (CANCER CENTER ONLY)
ALT: 8 U/L (ref 0–44)
AST: 18 U/L (ref 15–41)
Albumin: 3 g/dL — ABNORMAL LOW (ref 3.5–5.0)
Alkaline Phosphatase: 62 U/L (ref 38–126)
Anion gap: 6 (ref 5–15)
BUN: 15 mg/dL (ref 8–23)
CO2: 30 mmol/L (ref 22–32)
Calcium: 8.6 mg/dL — ABNORMAL LOW (ref 8.9–10.3)
Chloride: 108 mmol/L (ref 98–111)
Creatinine: 0.85 mg/dL (ref 0.61–1.24)
GFR, Estimated: >60 mL/min (ref >60)
Glucose, Bld: 106 mg/dL — ABNORMAL HIGH (ref 70–99)
Potassium: 3.6 mmol/L (ref 3.5–5.1)
Sodium: 144 mmol/L (ref 135–145)
Total Bilirubin: 0.3 mg/dL (ref 0.3–1.2)
Total Protein: 6.4 g/dL — ABNORMAL LOW (ref 6.5–8.1)

## 2020-05-07 MED ORDER — HEPARIN SOD (PORK) LOCK FLUSH 100 UNIT/ML IV SOLN
500.0000 [IU] | Freq: Once | INTRAVENOUS | Status: AC
  Filled 2020-05-07: qty 5

## 2020-05-07 MED ORDER — SODIUM CHLORIDE 0.9% FLUSH
10.0000 mL | INTRAVENOUS | Status: DC | PRN
  Administered 2020-05-07: 10 mL via INTRAVENOUS
  Filled 2020-05-07: qty 10

## 2020-05-07 MED ORDER — SODIUM CHLORIDE 0.9% FLUSH
10.0000 mL | INTRAVENOUS | Status: AC | PRN
  Filled 2020-05-07: qty 10

## 2020-05-07 NOTE — Progress Notes (Signed)
Hematology and Oncology Follow Up Visit  Andre Glass [id number] 1951-02-28 70 y.o. 05/07/2020 12:18 PM [name], DOWelborn, [name], DO   Principle Diagnosis: 70 year old man with advanced prostate cancer with disease to the bone diagnosed after presenting with Gleason score 4+5 = 9 and a PSA of 366 in December 2020.  He has castration-resistant disease at this time.   Prior Therapy: He is status post prostate biopsy completed on December 3 of 2020.  Pathology showed a Gleason score 4+5 = 9 with 12 out of 12 cores.  He is status post both of radiation therapy to the spine between T9 and L1.  He received 30 Gray in 10 fractions between January 15 and 28, 2021.   Firmagon 240 mg started on May 31, 2019.  Zytiga 1000 mg daily with prednisone 5 mg daily started in May 2021. Therapy discontinued in October of 2021 due to progression of disease.   Current therapy:   Eligard every 4 months with next injection will be given in March 2022.  Taxotere chemotherapy with 75 mg per metered squared every 3 weeks started in October 2021.  The dose was reduced to 60 mg per metered square after 3 cycles.  He is here for cycle 6.  Interim History: Ms. Blansett is here for a follow-up evaluation.  Since her last visit, he is reporting more complications related to chemotherapy.  He has reported more fatigue and edema as well as worsening sensory neuropathy in his upper and lower extremities.  He also reports some cold sensitivity associated with it.  He denies any nausea or vomiting and he has been eating well at this time.  Medications: Updated on review. Current Outpatient Medications  Medication Sig Dispense Refill  .      Marland Kitchen Ferrous Sulfate (IRON PO) Take 1 tablet by mouth daily.    Marland Kitchen HYDROmorphone (DILAUDID) 4 MG tablet Take 1 tablet (4 mg total) by mouth every 4 (four) hours as needed for severe pain. 60 tablet 0  . lidocaine-prilocaine (EMLA) cream Apply 1 application topically as needed. 30 g 0   . morphine (MS CONTIN) 30 MG 12 hr tablet Take 1 tablet (30 mg total) by mouth every 8 (eight) hours. 90 tablet 0  . Multiple Vitamin (MULTIVITAMIN WITH MINERALS) TABS tablet Take 1 tablet by mouth daily.    . ondansetron (ZOFRAN) 4 MG tablet TAKE 1 TABLET (4 MG TOTAL) BY MOUTH 3 (THREE) TIMES DAILY. (Patient taking differently: Take 4 mg by mouth 3 (three) times daily as needed for nausea or vomiting. ) 20 tablet 0  . oxybutynin (DITROPAN) 5 MG tablet Take 5 mg by mouth 3 (three) times daily as needed for bladder spasms.    . Plecanatide (TRULANCE) 3 MG TABS Take 3 mg by mouth daily. 30 tablet 2  . polyethylene glycol (MIRALAX) 17 g packet Take 17 g by mouth 2 (two) times daily. 60 each 0  . predniSONE (DELTASONE) 5 MG tablet Take 1 tablet (5 mg total) by mouth daily with breakfast. 90 tablet 3  . prochlorperazine (COMPAZINE) 10 MG tablet Take 1 tablet (10 mg total) by mouth every 6 (six) hours as needed for nausea or vomiting. 30 tablet 0  . senna (SENOKOT) 8.6 MG TABS tablet Take 2 tablets by mouth daily.     . tamsulosin (FLOMAX) 0.4 MG CAPS capsule Take 1 capsule (0.4 mg total) by mouth daily after supper. (Patient taking differently: Take 0.4 mg by mouth daily as needed (to help with  urination). ) 30 capsule 3   No current facility-administered medications for this visit.     Allergies:  Allergies  Allergen Reactions  . Ibuprofen Anaphylaxis  . Shrimp [Shellfish Allergy] Anaphylaxis       Physical Exam:   Blood pressure (!) 155/75, pulse 69, temperature 97.8 F (36.6 C), temperature source Tympanic, resp. rate 20, height 5\' 8"  (1.727 m), weight 215 lb 9.6 oz (97.8 kg), SpO2 97 %.      ECOG: 1     General appearance: Comfortable appearing without any discomfort Head: Normocephalic without any trauma Oropharynx: Mucous membranes are moist and pink without any thrush or ulcers. Eyes: Pupils are equal and round reactive to light. Lymph nodes: No cervical,  supraclavicular, inguinal or axillary lymphadenopathy.   Heart:regular rate and rhythm.  S1 and S2 with bilateral ankle edema. Lung: Clear without any rhonchi or wheezes.  No dullness to percussion. Abdomin: Soft, nontender, nondistended with good bowel sounds.  No hepatosplenomegaly. Musculoskeletal: No joint deformity or effusion.  Full range of motion noted. Neurological: No deficits noted on motor, sensory and deep tendon reflex exam. Skin: No petechial rash or dryness.  Appeared moist.                  Lab Results: Lab Results  Component Value Date   WBC 7.3 04/17/2020   HGB 7.9 (L) 04/17/2020   HCT 25.4 (L) 04/17/2020   MCV 97.3 04/17/2020   PLT 333 04/17/2020     Chemistry      Component Value Date/Time   NA 140 04/17/2020 1235   K 3.6 04/17/2020 1235   CL 107 04/17/2020 1235   CO2 28 04/17/2020 1235   BUN 7 (L) 04/17/2020 1235   CREATININE 0.82 04/17/2020 1235      Component Value Date/Time   CALCIUM 8.6 (L) 04/17/2020 1235   ALKPHOS 64 04/17/2020 1235   AST 15 04/17/2020 1235   ALT <6 04/17/2020 1235   BILITOT 0.3 04/17/2020 1235       Results for Persico, Dhilan R (MRN [id number]) as of 05/07/2020 12:22  Ref. Range 03/27/2020 10:17 04/17/2020 12:35  Prostate Specific Ag, Serum Latest Ref Range: 0.0 - 4.0 ng/mL 21.4 (H) 24.7 (H)        Impression and Plan:   70 year old man with:  1.    Advanced prostate cancer disease to the bone diagnosed in December 2020.  He has castration-resistant at this time.  The natural course of his disease was updated at this time and treatment options were reviewed.  He is currently on Taxotere chemotherapy with reasonable response by PSA criteria.  His PSA declined from 61.9 down to 21.  His last PSA did increase slightly to 24.  Risks and benefits of continuing this treatment were discussed at this time.  The plan is to continue and monitor his PSA.  If he has any rapid rise in his PSA different salvage therapy may  be needed.  We will obtain guardant 360 analysis with the next laboratory testing to assess for any targetable mutation for the future.  He is experiencing more complications related to Taxotere chemotherapy and we will hold treatment for the time being.  Peripheral neuropathy and edema are the main contributing factors.  I will reevaluate in 3 weeks for tentatively restarting chemotherapy at a reduced dose Taxotere.  This will be pending his neuropathy and other symptoms status.     2.  Androgen deprivation therapy: Next injection scheduled for March 2022.  Side effects associated with this treatment including weight gain, hot flashes among others.  He is agreeable to continue.  3.  Bone directed therapy: He is currently on calcium and vitamin D supplements.  Delton See has been deferred till he obtains dental evaluation and clearance.  4.  Bone pain: Manageable at this time with the current regimen.  5. Growth factor support: He is at risk of developing neutropenia and sepsis.  Growth factor support will be required after each cycle.   6.  IV access: Port-A-Cath remains in use without any issues.  7.  Constipation: Manageable with the current regimen.  8.  Follow-up: In 3 weeks for subsequent cycle of therapy.    30  minutes were dedicated to this encounter.  The time was spent on reviewing laboratory data, disease service update and future plan of care discussion.   Zola Button, MD 2/1/202212:18 PM

## 2020-05-08 ENCOUNTER — Other Ambulatory Visit: Payer: Self-pay

## 2020-05-08 DIAGNOSIS — C61 Malignant neoplasm of prostate: Secondary | ICD-10-CM

## 2020-05-08 LAB — PROSTATE-SPECIFIC AG, SERUM (LABCORP): Prostate Specific Ag, Serum: 27.2 ng/mL — ABNORMAL HIGH (ref 0.0–4.0)

## 2020-05-08 NOTE — Progress Notes (Signed)
r 

## 2020-05-09 ENCOUNTER — Ambulatory Visit: Payer: Medicare Other

## 2020-05-29 ENCOUNTER — Inpatient Hospital Stay: Payer: Medicare Other

## 2020-05-29 ENCOUNTER — Other Ambulatory Visit: Payer: Self-pay

## 2020-05-29 ENCOUNTER — Inpatient Hospital Stay (HOSPITAL_BASED_OUTPATIENT_CLINIC_OR_DEPARTMENT_OTHER): Payer: Medicare Other | Admitting: Oncology

## 2020-05-29 ENCOUNTER — Encounter: Payer: Self-pay | Admitting: Oncology

## 2020-05-29 VITALS — BP 119/60 | HR 67 | Temp 97.0°F | Resp 16 | Ht 68.0 in

## 2020-05-29 DIAGNOSIS — C61 Malignant neoplasm of prostate: Secondary | ICD-10-CM

## 2020-05-29 DIAGNOSIS — Z95828 Presence of other vascular implants and grafts: Secondary | ICD-10-CM

## 2020-05-29 LAB — CBC WITH DIFFERENTIAL (CANCER CENTER ONLY)
Abs Immature Granulocytes: 0.02 10*3/uL (ref 0.00–0.07)
Basophils Absolute: 0 10*3/uL (ref 0.0–0.1)
Basophils Relative: 0 %
Eosinophils Absolute: 0.2 10*3/uL (ref 0.0–0.5)
Eosinophils Relative: 2 %
HCT: 27.8 % — ABNORMAL LOW (ref 39.0–52.0)
Hemoglobin: 8.6 g/dL — ABNORMAL LOW (ref 13.0–17.0)
Immature Granulocytes: 0 %
Lymphocytes Relative: 19 %
Lymphs Abs: 1.5 10*3/uL (ref 0.7–4.0)
MCH: 30 pg (ref 26.0–34.0)
MCHC: 30.9 g/dL (ref 30.0–36.0)
MCV: 96.9 fL (ref 80.0–100.0)
Monocytes Absolute: 0.8 10*3/uL (ref 0.1–1.0)
Monocytes Relative: 10 %
Neutro Abs: 5.2 10*3/uL (ref 1.7–7.7)
Neutrophils Relative %: 69 %
Platelet Count: 238 10*3/uL (ref 150–400)
RBC: 2.87 MIL/uL — ABNORMAL LOW (ref 4.22–5.81)
RDW: 15.3 % (ref 11.5–15.5)
WBC Count: 7.7 10*3/uL (ref 4.0–10.5)
nRBC: 0 % (ref 0.0–0.2)

## 2020-05-29 LAB — CMP (CANCER CENTER ONLY)
ALT: 10 U/L (ref 0–44)
AST: 18 U/L (ref 15–41)
Albumin: 3.1 g/dL — ABNORMAL LOW (ref 3.5–5.0)
Alkaline Phosphatase: 83 U/L (ref 38–126)
Anion gap: 7 (ref 5–15)
BUN: 20 mg/dL (ref 8–23)
CO2: 26 mmol/L (ref 22–32)
Calcium: 8.7 mg/dL — ABNORMAL LOW (ref 8.9–10.3)
Chloride: 105 mmol/L (ref 98–111)
Creatinine: 1.07 mg/dL (ref 0.61–1.24)
GFR, Estimated: >60 mL/min (ref >60)
Glucose, Bld: 115 mg/dL — ABNORMAL HIGH (ref 70–99)
Potassium: 3.8 mmol/L (ref 3.5–5.1)
Sodium: 138 mmol/L (ref 135–145)
Total Bilirubin: 0.4 mg/dL (ref 0.3–1.2)
Total Protein: 7.2 g/dL (ref 6.5–8.1)

## 2020-05-29 MED ORDER — MORPHINE SULFATE ER 30 MG PO TBCR: 30.0000 mg | EXTENDED_RELEASE_TABLET | Freq: Three times a day (TID) | ORAL | 0 refills | Status: DC

## 2020-05-29 MED ORDER — ONDANSETRON HCL 4 MG PO TABS: 4.0000 mg | ORAL_TABLET | Freq: Three times a day (TID) | ORAL | 1 refills | Status: DC | PRN

## 2020-05-29 MED ORDER — SODIUM CHLORIDE 0.9% FLUSH
10.0000 mL | Freq: Once | INTRAVENOUS | Status: AC
  Administered 2020-05-29: 10 mL via INTRAVENOUS
  Filled 2020-05-29: qty 10

## 2020-05-29 MED ORDER — HEPARIN SOD (PORK) LOCK FLUSH 100 UNIT/ML IV SOLN
500.0000 [IU] | Freq: Once | INTRAVENOUS | Status: AC
  Administered 2020-05-29: 500 [IU] via INTRAVENOUS
  Filled 2020-05-29: qty 5

## 2020-05-29 MED ORDER — HYDROMORPHONE HCL 4 MG PO TABS: 4.0000 mg | ORAL_TABLET | ORAL | 0 refills | Status: DC | PRN

## 2020-05-29 NOTE — Patient Instructions (Signed)

## 2020-05-29 NOTE — Progress Notes (Signed)
Hematology and Oncology Follow Up Visit  Andre Glass [id number] [birth year] 70 y.o. 05/29/2020 10:20 AM Andre Glass, Andre Glass, [name], DO   Principle Diagnosis: 70 year old man with castration-resistant advanced prostate cancer with disease to the bone diagnosed in December 2020.  He presented with Gleason score 4+5 = 9 and a PSA of 366.   Prior Therapy: He is status post prostate biopsy completed on December 3 of 2020.  Pathology showed a Gleason score 4+5 = 9 with 12 out of 12 cores.  He is status post both of radiation therapy to the spine between T9 and L1.  He received 30 Gray in 10 fractions between January 15 and 28, 2021.   Firmagon 240 mg started on May 31, 2019.  Zytiga 1000 mg daily with prednisone 5 mg daily started in May 2021. Therapy discontinued in October of 2021 due to progression of disease.   Current therapy:   Eligard every 4 months with next injection will be given in March 2022.  Taxotere chemotherapy with 75 mg per metered squared every 3 weeks started in October 2021.  The dose was reduced to 60 mg per metered square after 3 cycles.  He is status post 4 cycles of therapy.  Interim History: Andre Glass returns today for a follow-up.  Since the last visit, he reports no major improvement in his overall health.  He continues to have issues with sensory neuropathy in his upper and lower extremities although not interfering with function.  He has reported no painful neuropathy at this time.  But he is having more difficulties gripping things and reported unsteadiness with mobility.  His pain is overall manageable with morphine with very little breakthrough at this time.  Bowel habits are altered with constipation but overall manageable.  As performance status remains marginal although has not declined dramatically.  Medications: Unchanged on review. Current Outpatient Medications  Medication Sig Dispense Refill  .      Marland Kitchen Ferrous Sulfate (IRON PO) Take 1 tablet by  mouth daily.    Marland Kitchen HYDROmorphone (DILAUDID) 4 MG tablet Take 1 tablet (4 mg total) by mouth every 4 (four) hours as needed for severe pain. 60 tablet 0  . lidocaine-prilocaine (EMLA) cream Apply 1 application topically as needed. 30 g 0  . morphine (MS CONTIN) 30 MG 12 hr tablet Take 1 tablet (30 mg total) by mouth every 8 (eight) hours. 90 tablet 0  . Multiple Vitamin (MULTIVITAMIN WITH MINERALS) TABS tablet Take 1 tablet by mouth daily.    . ondansetron (ZOFRAN) 4 MG tablet TAKE 1 TABLET (4 MG TOTAL) BY MOUTH 3 (THREE) TIMES DAILY. (Patient taking differently: Take 4 mg by mouth 3 (three) times daily as needed for nausea or vomiting. ) 20 tablet 0  . oxybutynin (DITROPAN) 5 MG tablet Take 5 mg by mouth 3 (three) times daily as needed for bladder spasms.    . Plecanatide (TRULANCE) 3 MG TABS Take 3 mg by mouth daily. 30 tablet 2  . polyethylene glycol (MIRALAX) 17 g packet Take 17 g by mouth 2 (two) times daily. 60 each 0  . predniSONE (DELTASONE) 5 MG tablet Take 1 tablet (5 mg total) by mouth daily with breakfast. 90 tablet 3  . prochlorperazine (COMPAZINE) 10 MG tablet Take 1 tablet (10 mg total) by mouth every 6 (six) hours as needed for nausea or vomiting. 30 tablet 0  . senna (SENOKOT) 8.6 MG TABS tablet Take 2 tablets by mouth daily.     . tamsulosin (  FLOMAX) 0.4 MG CAPS capsule Take 1 capsule (0.4 mg total) by mouth daily after supper. (Patient taking differently: Take 0.4 mg by mouth daily as needed (to help with urination). ) 30 capsule 3   No current facility-administered medications for this visit.     Allergies:  Allergies  Allergen Reactions  . Ibuprofen Anaphylaxis  . Shrimp [Shellfish Allergy] Anaphylaxis       Physical Exam:     Blood pressure 119/60, pulse 67, temperature (!) 97 F (36.1 C), temperature source Tympanic, resp. rate 16, height 5\' 8"  (1.727 m), SpO2 100 %.     ECOG: 2    General appearance: Alert, awake without any distress. Head: Atraumatic  without abnormalities Oropharynx: Without any thrush or ulcers. Eyes: No scleral icterus. Lymph nodes: No lymphadenopathy noted in the cervical, supraclavicular, or axillary nodes Heart:regular rate and rhythm, without any murmurs or gallops.   Lung: Clear to auscultation without any rhonchi, wheezes or dullness to percussion. Abdomin: Soft, nontender without any shifting dullness or ascites. Musculoskeletal: No clubbing or cyanosis. Neurological: No motor or sensory deficits. Skin: No rashes or lesions.                   Lab Results: Lab Results  Component Value Date   WBC 8.4 05/07/2020   HGB 8.5 (L) 05/07/2020   HCT 28.0 (L) 05/07/2020   MCV 100.4 (H) 05/07/2020   PLT 235 05/07/2020     Chemistry      Component Value Date/Time   NA 144 05/07/2020 1233   K 3.6 05/07/2020 1233   CL 108 05/07/2020 1233   CO2 30 05/07/2020 1233   BUN 15 05/07/2020 1233   CREATININE 0.85 05/07/2020 1233      Component Value Date/Time   CALCIUM 8.6 (L) 05/07/2020 1233   ALKPHOS 62 05/07/2020 1233   AST 18 05/07/2020 1233   ALT 8 05/07/2020 1233   BILITOT 0.3 05/07/2020 1233          Results for Andre Glass, Andre Glass (MRN [id number]) as of 05/29/2020 10:22  Ref. Range 01/23/2020 11:10 02/14/2020 07:55 03/06/2020 11:45 03/27/2020 10:17 04/17/2020 12:35 05/07/2020 12:33  Prostate Specific Ag, Serum Latest Ref Range: 0.0 - 4.0 ng/mL 61.9 (H) 33.4 (H) 25.5 (H) 21.4 (H) 24.7 (H) 27.2 (H)      Impression and Plan:   70 year old man with:  1.   Castration-resistant advanced prostate cancer with disease to the bone diagnosed in 2020.   The natural course of his disease was reviewed at this time and treatment options were reiterated. Chemotherapy was withheld 3 weeks ago as of neuropathy and potentially to be restarted today.  Risks and benefits of starting next year were reviewed.  Nausea, vomiting, myelosuppression in addition to neuropathy were reiterated.  Alternative treatment  options would be Xofigo, PARP inhibitor if he harbors the appropriate mutation among other options.  Guardant 360 analysis was obtained today and will await his results.  His PSA is showing slight increase in could be developing resistant to Taxotere chemotherapy.  After discussion today, he opted to continue to hold Taxotere chemotherapy and update his staging scans before the next visit.  We will tentatively restart chemotherapy in 3 weeks or switch to a different therapy pending the results of his guardant 360 analysis as well as updating his scans.  2.  Androgen deprivation therapy: Long-term complication clinic weight gain, hot flashes among others were reviewed.  Next injection will be scheduled in March 2022.  3.  Bone directed therapy: He is currently on calcium and vitamin D supplements.  Delton See has been deferred till he obtains dental evaluation and clearance.  4.  Bone pain: Related to prostate cancer.  He is currently on morphine long-acting as well as breakthrough Dilaudid.  5. Growth factor support: He will receive growth factor support after each cycle of chemotherapy given his risk of neutropenia.   6.  IV access: Port-A-Cath currently in use and will be in place for the time being.  7.  Constipation: I continue to emphasize importance of bowel regimen.  He had continues to take stool softeners regularly.  8.  Follow-up: Return in 3 weeks for the next cycle of therapy.    30  minutes were spent on this visit.  The time was dedicated to reviewing disease status, discussing treatment options and future plan of care reviewed.   Zola Button, MD 2/23/202210:20 AM

## 2020-05-30 ENCOUNTER — Telehealth: Payer: Self-pay | Admitting: Family Medicine

## 2020-05-30 ENCOUNTER — Telehealth: Payer: Self-pay

## 2020-05-30 LAB — PROSTATE-SPECIFIC AG, SERUM (LABCORP): Prostate Specific Ag, Serum: 34.4 ng/mL — ABNORMAL HIGH (ref 0.0–4.0)

## 2020-05-30 NOTE — Telephone Encounter (Signed)
Called and informed patient of information listed below.  Patient verbalized understanding. All questions were answered during this phone call.

## 2020-05-30 NOTE — Telephone Encounter (Signed)
Attempted to reach Andre Glass today to schedule him for a phone visit with the Man[employer] team. I left my contact info for him to return my call. I will reach out again in a few days if I have not heard back from him.

## 2020-05-30 NOTE — Telephone Encounter (Signed)
-----   Message from [name], MD sent at 05/30/2020  7:56 AM EST ----- Please let him know his PSA is slightly up. No changes for now.

## 2020-05-31 ENCOUNTER — Ambulatory Visit: Payer: Medicare Other

## 2020-06-14 ENCOUNTER — Ambulatory Visit (HOSPITAL_COMMUNITY)
Admission: RE | Admit: 2020-06-14 | Discharge: 2020-06-14 | Disposition: A | Discharge status: Home / Self Care | Payer: Medicare Other | Source: Ambulatory Visit | Attending: Oncology | Admitting: Oncology

## 2020-06-14 ENCOUNTER — Other Ambulatory Visit: Payer: Self-pay

## 2020-06-14 DIAGNOSIS — C61 Malignant neoplasm of prostate: Secondary | ICD-10-CM | POA: Insufficient documentation

## 2020-06-14 MED ORDER — IOHEXOL 300 MG/ML  SOLN
100.0000 mL | Freq: Once | INTRAMUSCULAR | Status: AC | PRN
  Administered 2020-06-14: 100 mL via INTRAVENOUS

## 2020-06-14 MED ORDER — HEPARIN SOD (PORK) LOCK FLUSH 100 UNIT/ML IV SOLN: 500.0000 [IU] | Freq: Once | INTRAVENOUS | Status: DC

## 2020-06-17 MED FILL — Dexamethasone Sodium Phosphate Inj 100 MG/10ML: INTRAMUSCULAR | Qty: 1 | Status: AC

## 2020-06-18 ENCOUNTER — Ambulatory Visit: Payer: Medicare Other

## 2020-06-18 ENCOUNTER — Other Ambulatory Visit: Payer: Self-pay

## 2020-06-18 ENCOUNTER — Inpatient Hospital Stay (HOSPITAL_BASED_OUTPATIENT_CLINIC_OR_DEPARTMENT_OTHER): Payer: Medicare Other | Admitting: Oncology

## 2020-06-18 ENCOUNTER — Inpatient Hospital Stay: Payer: Medicare Other | Attending: Oncology

## 2020-06-18 ENCOUNTER — Inpatient Hospital Stay: Payer: Medicare Other

## 2020-06-18 VITALS — BP 140/60 | HR 72 | Temp 97.5°F | Resp 14 | Ht 68.0 in | Wt 189.5 lb

## 2020-06-18 DIAGNOSIS — G629 Polyneuropathy, unspecified: Secondary | ICD-10-CM | POA: Insufficient documentation

## 2020-06-18 DIAGNOSIS — R110 Nausea: Secondary | ICD-10-CM | POA: Diagnosis not present

## 2020-06-18 DIAGNOSIS — C7951 Secondary malignant neoplasm of bone: Secondary | ICD-10-CM | POA: Diagnosis not present

## 2020-06-18 DIAGNOSIS — C61 Malignant neoplasm of prostate: Secondary | ICD-10-CM | POA: Diagnosis present

## 2020-06-18 DIAGNOSIS — Z79899 Other long term (current) drug therapy: Secondary | ICD-10-CM | POA: Diagnosis not present

## 2020-06-18 DIAGNOSIS — Z7952 Long term (current) use of systemic steroids: Secondary | ICD-10-CM | POA: Diagnosis not present

## 2020-06-18 DIAGNOSIS — Z95828 Presence of other vascular implants and grafts: Secondary | ICD-10-CM

## 2020-06-18 LAB — CMP (CANCER CENTER ONLY)
ALT: 10 U/L (ref 0–44)
AST: 16 U/L (ref 15–41)
Albumin: 2.7 g/dL — ABNORMAL LOW (ref 3.5–5.0)
Alkaline Phosphatase: 75 U/L (ref 38–126)
Anion gap: 8 (ref 5–15)
BUN: 14 mg/dL (ref 8–23)
CO2: 26 mmol/L (ref 22–32)
Calcium: 9.2 mg/dL (ref 8.9–10.3)
Chloride: 104 mmol/L (ref 98–111)
Creatinine: 1.3 mg/dL — ABNORMAL HIGH (ref 0.61–1.24)
GFR, Estimated: 59 mL/min — ABNORMAL LOW (ref >60)
Glucose, Bld: 84 mg/dL (ref 70–99)
Potassium: 4.1 mmol/L (ref 3.5–5.1)
Sodium: 138 mmol/L (ref 135–145)
Total Bilirubin: 0.3 mg/dL (ref 0.3–1.2)
Total Protein: 8 g/dL (ref 6.5–8.1)

## 2020-06-18 LAB — CBC WITH DIFFERENTIAL (CANCER CENTER ONLY)
Abs Immature Granulocytes: 0.01 10*3/uL (ref 0.00–0.07)
Basophils Absolute: 0 10*3/uL (ref 0.0–0.1)
Basophils Relative: 1 %
Eosinophils Absolute: 0.2 10*3/uL (ref 0.0–0.5)
Eosinophils Relative: 4 %
HCT: 27 % — ABNORMAL LOW (ref 39.0–52.0)
Hemoglobin: 8.6 g/dL — ABNORMAL LOW (ref 13.0–17.0)
Immature Granulocytes: 0 %
Lymphocytes Relative: 27 %
Lymphs Abs: 1.2 10*3/uL (ref 0.7–4.0)
MCH: 29.6 pg (ref 26.0–34.0)
MCHC: 31.9 g/dL (ref 30.0–36.0)
MCV: 92.8 fL (ref 80.0–100.0)
Monocytes Absolute: 0.3 10*3/uL (ref 0.1–1.0)
Monocytes Relative: 7 %
Neutro Abs: 2.7 10*3/uL (ref 1.7–7.7)
Neutrophils Relative %: 61 %
Platelet Count: 327 10*3/uL (ref 150–400)
RBC: 2.91 MIL/uL — ABNORMAL LOW (ref 4.22–5.81)
RDW: 14.1 % (ref 11.5–15.5)
WBC Count: 4.4 10*3/uL (ref 4.0–10.5)
nRBC: 0 % (ref 0.0–0.2)

## 2020-06-18 MED ORDER — ONDANSETRON HCL 4 MG PO TABS: 4.0000 mg | ORAL_TABLET | Freq: Three times a day (TID) | ORAL | 1 refills | Status: DC | PRN

## 2020-06-18 MED ORDER — HEPARIN SOD (PORK) LOCK FLUSH 100 UNIT/ML IV SOLN
500.0000 [IU] | Freq: Once | INTRAVENOUS | Status: AC
  Administered 2020-06-18: 500 [IU] via INTRAVENOUS
  Filled 2020-06-18: qty 5

## 2020-06-18 MED ORDER — SODIUM CHLORIDE 0.9% FLUSH
10.0000 mL | INTRAVENOUS | Status: DC | PRN
  Administered 2020-06-18: 10 mL via INTRAVENOUS
  Filled 2020-06-18: qty 10

## 2020-06-18 NOTE — Progress Notes (Signed)
Hematology and Oncology Follow Up Visit  Andre Glass [id number] 11/27/1950 70 y.o. 06/18/2020 12:16 PM Andre Glass, Andre Glass, [name], DO   Principle Diagnosis: 70 year old man with advanced prostate cancer diagnosed in December 2020.  He has developed castration-resistant after presenting with Gleason score 4+5 = 9 and a PSA of 366 and bone disease at the time of diagnosis.   Prior Therapy: He is status post prostate biopsy completed on December 3 of 2020.  Pathology showed a Gleason score 4+5 = 9 with 12 out of 12 cores.  He is status post both of radiation therapy to the spine between T9 and L1.  He received 30 Gray in 10 fractions between January 15 and 28, 2021.   Firmagon 240 mg started on May 31, 2019.  Zytiga 1000 mg daily with prednisone 5 mg daily started in May 2021. Therapy discontinued in October of 2021 due to progression of disease.   Current therapy:   Eligard every 4 months with next injection will be given in March 2022.  Taxotere chemotherapy with 75 mg per metered squared every 3 weeks started in October 2021.  The dose was reduced to 60 mg per metered square after 3 cycles.   He completed 5 cycles of therapy.  Interim History: Andre Glass returns today for repeat evaluation.  Since the last visit, he reports no major changes in his health.  He has a reported worsening neuropathy in his upper and lower extremities and some mild nausea.  He denies any vomiting or abdominal pain.  He denies any bone pain or pathological fractures.  He has increased more pelvic discomfort and difficulty urination at times.  His performance status is limited but has not dramatically changed.  Medications: Updated on review. Current Outpatient Medications  Medication Sig Dispense Refill  .      Marland Kitchen Ferrous Sulfate (IRON PO) Take 1 tablet by mouth daily.    Marland Kitchen HYDROmorphone (DILAUDID) 4 MG tablet Take 1 tablet (4 mg total) by mouth every 4 (four) hours as needed for severe pain. 60 tablet  0  . lidocaine-prilocaine (EMLA) cream Apply 1 application topically as needed. 30 g 0  . morphine (MS CONTIN) 30 MG 12 hr tablet Take 1 tablet (30 mg total) by mouth every 8 (eight) hours. 90 tablet 0  . Multiple Vitamin (MULTIVITAMIN WITH MINERALS) TABS tablet Take 1 tablet by mouth daily.    . ondansetron (ZOFRAN) 4 MG tablet TAKE 1 TABLET (4 MG TOTAL) BY MOUTH 3 (THREE) TIMES DAILY. (Patient taking differently: Take 4 mg by mouth 3 (three) times daily as needed for nausea or vomiting. ) 20 tablet 0  . oxybutynin (DITROPAN) 5 MG tablet Take 5 mg by mouth 3 (three) times daily as needed for bladder spasms.    . Plecanatide (TRULANCE) 3 MG TABS Take 3 mg by mouth daily. 30 tablet 2  . polyethylene glycol (MIRALAX) 17 g packet Take 17 g by mouth 2 (two) times daily. 60 each 0  . predniSONE (DELTASONE) 5 MG tablet Take 1 tablet (5 mg total) by mouth daily with breakfast. 90 tablet 3  . prochlorperazine (COMPAZINE) 10 MG tablet Take 1 tablet (10 mg total) by mouth every 6 (six) hours as needed for nausea or vomiting. 30 tablet 0  . senna (SENOKOT) 8.6 MG TABS tablet Take 2 tablets by mouth daily.     . tamsulosin (FLOMAX) 0.4 MG CAPS capsule Take 1 capsule (0.4 mg total) by mouth daily after supper. (Patient taking differently:  Take 0.4 mg by mouth daily as needed (to help with urination). ) 30 capsule 3   No current facility-administered medications for this visit.     Allergies:  Allergies  Allergen Reactions  . Ibuprofen Anaphylaxis  . Shrimp [Shellfish Allergy] Anaphylaxis       Physical Exam:      Blood pressure 140/60, pulse 72, temperature (!) 97.5 F (36.4 C), temperature source Tympanic, resp. rate 14, height 5\' 8"  (1.727 m), weight 189 lb 8 oz (86 kg), SpO2 100 %.     ECOG: 2    General appearance: Comfortable appearing without any discomfort Head: Normocephalic without any trauma Oropharynx: Mucous membranes are moist and pink without any thrush or ulcers. Eyes:  Pupils are equal and round reactive to light. Lymph nodes: No cervical, supraclavicular, inguinal or axillary lymphadenopathy.   Heart:regular rate and rhythm.  S1 and S2 without leg edema. Lung: Clear without any rhonchi or wheezes.  No dullness to percussion. Abdomin: Soft, nontender, nondistended with good bowel sounds.  No hepatosplenomegaly. Musculoskeletal: No joint deformity or effusion.  Full range of motion noted. Neurological: No deficits noted on motor, sensory and deep tendon reflex exam. Skin: No petechial rash or dryness.  Appeared moist.                     Lab Results: Lab Results  Component Value Date   WBC 7.7 05/29/2020   HGB 8.6 (L) 05/29/2020   HCT 27.8 (L) 05/29/2020   MCV 96.9 05/29/2020   PLT 238 05/29/2020     Chemistry      Component Value Date/Time   NA 138 05/29/2020 1037   K 3.8 05/29/2020 1037   CL 105 05/29/2020 1037   CO2 26 05/29/2020 1037   BUN 20 05/29/2020 1037   CREATININE 1.07 05/29/2020 1037      Component Value Date/Time   CALCIUM 8.7 (L) 05/29/2020 1037   ALKPHOS 83 05/29/2020 1037   AST 18 05/29/2020 1037   ALT 10 05/29/2020 1037   BILITOT 0.4 05/29/2020 1037        Results for Andre Glass (MRN [id number]) as of 06/18/2020 11:20  Ref. Range 05/29/2020 10:37  Prostate Specific Ag, Serum Latest Ref Range: 0.0 - 4.0 ng/mL 34.4 (H)         Impression and Plan:   70 year old man with:  1.    Advanced prostate cancer diagnosed in December 2020.  He has castration-resistant disease with disease to the bone.   He completed the 5 cycles of Taxotere chemotherapy with initial PSA response and most recently a rising PSA.  Imaging studies obtained on June 14, 2020 were personally reviewed and discussed with patient today.  He has developed progression of disease predominantly large pelvic mass arising from his prostate despite being on chemotherapy.  Treatment options were discussed at this time which include  continuing Taxotere chemotherapy, switching to a different systemic chemotherapy, obtaining tissue biopsy versus radiation therapy.  Given his overall disease status is stable otherwise consideration for radiation therapy may be a reasonable approach given the enlarging nature of the tumor and hydronephrosis is causing.  After discussion he is agreeable to proceed with radiation and we will make the appropriate referrals.  2.  Androgen deprivation therapy: He received Eligard in November 2021 and will be repeated in the near future.  Complications including weight gain and hot flashes were reiterated.  3.  Bone directed therapy: I recommended continuing calcium vitamin D supplements.  After obtaining dental clearance Delton See will be considered.  4.  Bone pain: He is currently on long-acting morphine and Dilaudid as needed.  Pain is manageable.   5.  IV access: Port-A-Cath will be flushed and managed periodically.  6.  Hydronephrosis: We will check kidney function and refer to urology for management.  He might require percutaneous nephrostomy tube.  7.  Constipation: Manageable at this time.  8.  Follow-up: Will be a following radiation therapy for an evaluation.    30  minutes were dedicated to this encounter.  Time was spent on reviewing imaging studies, laboratory data and management to the future plan of care.   Zola Button, MD 3/15/202212:16 PM

## 2020-06-19 ENCOUNTER — Telehealth: Payer: Self-pay | Admitting: Oncology

## 2020-06-19 ENCOUNTER — Ambulatory Visit
Admission: RE | Admit: 2020-06-19 | Discharge: 2020-06-19 | Disposition: A | Discharge status: Home / Self Care | Payer: Medicare Other | Source: Ambulatory Visit | Attending: Radiation Oncology | Admitting: Radiation Oncology

## 2020-06-19 ENCOUNTER — Encounter: Payer: Self-pay | Admitting: Radiation Oncology

## 2020-06-19 ENCOUNTER — Other Ambulatory Visit: Payer: Self-pay

## 2020-06-19 ENCOUNTER — Ambulatory Visit
Admission: RE | Admit: 2020-06-19 | Discharge: 2020-06-19 | Disposition: A | Discharge status: Home / Self Care | Payer: Medicare Other | Source: Ambulatory Visit | Attending: Urology | Admitting: Urology

## 2020-06-19 ENCOUNTER — Encounter: Payer: Self-pay | Admitting: Oncology

## 2020-06-19 VITALS — BP 149/71 | HR 78 | Temp 97.5°F | Resp 16 | Ht 68.0 in | Wt 189.0 lb

## 2020-06-19 DIAGNOSIS — Z192 Hormone resistant malignancy status: Secondary | ICD-10-CM | POA: Diagnosis not present

## 2020-06-19 DIAGNOSIS — C61 Malignant neoplasm of prostate: Secondary | ICD-10-CM | POA: Insufficient documentation

## 2020-06-19 DIAGNOSIS — Z87442 Personal history of urinary calculi: Secondary | ICD-10-CM | POA: Insufficient documentation

## 2020-06-19 DIAGNOSIS — Z79899 Other long term (current) drug therapy: Secondary | ICD-10-CM | POA: Diagnosis not present

## 2020-06-19 DIAGNOSIS — R911 Solitary pulmonary nodule: Secondary | ICD-10-CM | POA: Insufficient documentation

## 2020-06-19 DIAGNOSIS — Z510 Encounter for antineoplastic radiation therapy: Secondary | ICD-10-CM | POA: Diagnosis present

## 2020-06-19 DIAGNOSIS — Z7952 Long term (current) use of systemic steroids: Secondary | ICD-10-CM | POA: Diagnosis not present

## 2020-06-19 DIAGNOSIS — C7951 Secondary malignant neoplasm of bone: Secondary | ICD-10-CM | POA: Insufficient documentation

## 2020-06-19 DIAGNOSIS — Z923 Personal history of irradiation: Secondary | ICD-10-CM | POA: Diagnosis not present

## 2020-06-19 LAB — PROSTATE-SPECIFIC AG, SERUM (LABCORP): Prostate Specific Ag, Serum: 74.4 ng/mL — ABNORMAL HIGH (ref 0.0–4.0)

## 2020-06-19 NOTE — Telephone Encounter (Signed)
Scheduled follow-up appointment per 3/15 los. Patient is aware. 

## 2020-06-19 NOTE — Progress Notes (Signed)
Radiation Oncology         [phone] ________________________________  Outpatient Re-Consultation  Name: Andre Glass MRN: [id number]  Date of Service: 06/19/2020 DOB: 12-Apr-1950  GE:[id number], Thurmond Butts, DO  [name] Desiree Hane, MD   REFERRING PHYSICIAN: Zola Button, MD  DIAGNOSIS: 70 year old male with a painful, enlarging pelvic mass secondary to progressive castrate resistant metastatic prostate cancer.    ICD-10-CM   1. Metastatic castration-resistant adenocarcinoma of prostate [facility])  C61    Z19.2     HISTORY OF PRESENT ILLNESS: MANCEL LARDIZABAL is a 70 y.o. male seen at the request of Dr. [name].  He was initially diagnosed with advanced, Gleason 4+5 adenocarcinoma the prostate in December 2020 with a PSA of 366 and bone disease at the time of diagnosis.  Repeat PSA on 04/11/2019 had doubled to 632.  He had worsening low back and left lateral hip pain so we saw him for a consult visit on April 18, 2019 for consideration of palliative radiotherapy to the symptomatic metastatic lesions at T10 and T12 which was completed on May 04, 2019.  He tolerated the treatment well with significant improvement in his back pain.  He was started on Firmagon ADT on 05/31/2019 followed by Eligard every 4 months beginning in March 2021.  He developed progressive, poorly controlled low back pain despite narcotic pain medications prompting a visit to the emergency department on 05/29/2019 where a repeat thoracic MRI and lumbar spine scan showed stable, known multifocal osseous metastatic disease with no significant epidural extension at the thoracolumbar levels, no cord compression.  There was left sacral foraminal extension at S3 and no acute collapse deformity.  This pain did improve with the ADT.  Fabio Asa was added in May 2021 but discontinued in October 2021 due to disease progression.  He has continued on 74-monthEligard ADT injections and Taxotere chemotherapy was added in October 2021.  The dose had to be reduced  after 3 cycles but he was able to complete 5 cycles of therapy with initial PSA response but most recently has had a rising PSA.  His PSA nadired at 21.4 in December 2021 and has been gradually rising since that time at 24.7 in January 2021, 27.2 on 05/07/2020, 34.4 on 05/29/2020 and most recently up to 74.4 on 06/18/2020.  His most recent restaging imaging with CT C/A/P on 06/14/2020 demonstrated interval enlargement of the large pelvic mass with worsening of pelvic visceral and ureteral involvement.  There was new moderate right-sided hydroureteronephrosis secondary to obstruction from the large pelvic mass and enlarging pelvic lymph nodes with diffuse bony metastases, worse in the L-spine and in the pelvis.  He met with Dr. SAlen Blewon 06/18/2020 to review results and recommendations.  They discussed the potential to continue Taxotere chemotherapy versus switching to a different systemic chemotherapy, obtaining tissue biopsy or proceeding with palliative radiotherapy.  He was most interested in proceeding with the palliative radiotherapy and he has therefore been kindly referred back to [facility] for further discussion regarding treatment.  PREVIOUS RADIATION THERAPY: Yes- 1/15-1/28/21:The spine from T9-L1 was treated to 30 Gy in 10 fractions  PAST MEDICAL HISTORY:  Past Medical History:  Diagnosis Date  . Anemia   . History of kidney stones   . [place]   . UTI (urinary tract infection)       PAST SURGICAL HISTORY: Past Surgical History:  Procedure Laterality Date  . IR IMAGING GUIDED PORT INSERTION  01/31/2020  . TRANSURETHRAL RESECTION OF PROSTATE N/A  11/08/2019   Procedure: TRANSURETHRAL RESECTION OF THE PROSTATE (TURP);  Surgeon: [name], MD;  Location: [facility];  Service: Urology;  Laterality: N/A;    FAMILY HISTORY:  Family History  Problem Relation Age of Onset  . Stomach cancer Mother   . Pancreatic cancer Sister   . Breast cancer Neg Hx   . Prostate cancer Neg  Hx   . Colon cancer Neg Hx     SOCIAL HISTORY:  Social History   Socioeconomic History  . Marital status: Single    Spouse name: Not on file  . Number of children: 0  . Years of education: Not on file  . Highest education level: Not on file  Occupational History  . Not on file  Tobacco Use  . Smoking status: Never Smoker  . Smokeless tobacco: Never Used  Vaping Use  . Vaping Use: Never used  Substance and Sexual Activity  . Alcohol use: Never  . Drug use: Never  . Sexual activity: Not Currently  Other Topics Concern  . Not on file  Social History Narrative  . Not on file   Social Determinants of Health   Financial Resource Strain: Not on file  Food Insecurity: Not on file  Transportation Needs: Not on file  Physical Activity: Not on file  Stress: Not on file  Social Connections: Not on file  Intimate Partner Violence: Not on file    ALLERGIES: Ibuprofen and Shrimp [shellfish allergy]  MEDICATIONS:  Current Outpatient Medications  Medication Sig Dispense Refill  . abiraterone acetate (ZYTIGA) 250 MG tablet Take 4 tablets (1,000 mg total) by mouth daily. Take on an empty stomach 1 hour before or 2 hours after a meal 120 tablet 0  . Ferrous Sulfate (IRON PO) Take 1 tablet by mouth daily.    Marland Kitchen HYDROmorphone (DILAUDID) 4 MG tablet Take 1 tablet (4 mg total) by mouth every 4 (four) hours as needed for severe pain. 60 tablet 0  . lidocaine-prilocaine (EMLA) cream Apply 1 application topically as needed. 30 g 0  . morphine (MS CONTIN) 30 MG 12 hr tablet Take 1 tablet (30 mg total) by mouth every 8 (eight) hours. 90 tablet 0  . Multiple Vitamin (MULTIVITAMIN WITH MINERALS) TABS tablet Take 1 tablet by mouth daily.    . NONFORMULARY OR COMPOUNDED ITEM Apply 1-2 g topically 4 (four) times daily. 60 each 2  . ondansetron (ZOFRAN) 4 MG tablet Take 1 tablet (4 mg total) by mouth 3 (three) times daily as needed for nausea or vomiting. 40 tablet 1  . oxybutynin (DITROPAN) 5 MG  tablet Take 5 mg by mouth 3 (three) times daily as needed for bladder spasms.    . Plecanatide (TRULANCE) 3 MG TABS Take 3 mg by mouth daily. 30 tablet 2  . polyethylene glycol (MIRALAX) 17 g packet Take 17 g by mouth 2 (two) times daily. 60 each 0  . predniSONE (DELTASONE) 5 MG tablet Take 1 tablet (5 mg total) by mouth daily with breakfast. 90 tablet 3  . prochlorperazine (COMPAZINE) 10 MG tablet Take 1 tablet (10 mg total) by mouth every 6 (six) hours as needed for nausea or vomiting. 30 tablet 0  . senna (SENOKOT) 8.6 MG TABS tablet Take 2 tablets by mouth daily.    . tamsulosin (FLOMAX) 0.4 MG CAPS capsule Take 1 capsule (0.4 mg total) by mouth daily after supper. (Patient taking differently: Take 0.4 mg by mouth daily as needed (to help with urination).) 30 capsule  3   No current facility-administered medications for this encounter.   Facility-Administered Medications Ordered in Other Encounters  Medication Dose Route Frequency Provider Last Rate Last Admin  . heparin lock flush 100 unit/mL  500 Units Intravenous Once Shadad, Mathis Dad, MD      . sodium chloride flush (NS) 0.9 % injection 10 mL  10 mL Intravenous PRN [name], Mathis Dad, MD        REVIEW OF SYSTEMS:  On review of systems, the patient reports that he is doing well overall.  He denies any chest pain, shortness of breath, cough, fevers, chills, night sweats, or unintended weight changes.  He reports constipation associated with narcotic pain medications but denies abdominal pain, nausea or vomiting.  He reports increased pelvic discomfort with difficulty passing BMs and difficulty voiding at times.  He denies dysuria, gross hematuria, or flank pain.  A complete review of systems is obtained and is otherwise negative.    PHYSICAL EXAM:  Wt Readings from Last 3 Encounters:  06/18/20 189 lb 8 oz (86 kg)  05/07/20 215 lb 9.6 oz (97.8 kg)  04/17/20 203 lb 4.8 oz (92.2 kg)   Temp Readings from Last 3 Encounters:  06/18/20 (!) 97.5  F (36.4 C) (Tympanic)  05/29/20 (!) 97 F (36.1 C) (Tympanic)  05/07/20 97.8 F (36.6 C) (Tympanic)   BP Readings from Last 3 Encounters:  06/18/20 140/60  05/29/20 119/60  05/07/20 (!) 155/75   Pulse Readings from Last 3 Encounters:  06/18/20 72  05/29/20 67  05/07/20 69    /10 In general this is a well appearing African-American male in no acute distress.  He's alert and oriented x4 and appropriate throughout the examination. Cardiopulmonary assessment is negative for acute distress and he exhibits normal effort.   KPS = 70  100 - Normal; no complaints; no evidence of disease. 90   - Able to carry on normal activity; minor signs or symptoms of disease. 80   - Normal activity with effort; some signs or symptoms of disease. 47   - Cares for self; unable to carry on normal activity or to do active work. 60   - Requires occasional assistance, but is able to care for most of his personal needs. 50   - Requires considerable assistance and frequent medical care. 81   - Disabled; requires special care and assistance. 63   - Severely disabled; hospital admission is indicated although death not imminent. 67   - Very sick; hospital admission necessary; active supportive treatment necessary. 10   - Moribund; fatal processes progressing rapidly. 0     - Dead  Karnofsky DA, Abelmann [place], Craver LS and Burchenal JH [phone]) The use of the nitrogen mustards in the palliative treatment of carcinoma: with particular reference to bronchogenic carcinoma Cancer 1 634-56  LABORATORY DATA:  Lab Results  Component Value Date   WBC 4.4 06/18/2020   HGB 8.6 (L) 06/18/2020   HCT 27.0 (L) 06/18/2020   MCV 92.8 06/18/2020   PLT 327 06/18/2020   Lab Results  Component Value Date   NA 138 06/18/2020   K 4.1 06/18/2020   CL 104 06/18/2020   CO2 26 06/18/2020   Lab Results  Component Value Date   ALT 10 06/18/2020   AST 16 06/18/2020   ALKPHOS 75 06/18/2020   BILITOT 0.3 06/18/2020      RADIOGRAPHY: CT CHEST ABDOMEN PELVIS W CONTRAST  Result Date: 06/16/2020 CLINICAL DATA:  Prostate cancer, assess treatment response. 70 year old  male with extensive disease on previous imaging. EXAM: CT CHEST, ABDOMEN, AND PELVIS WITH CONTRAST TECHNIQUE: Multidetector CT imaging of the chest, abdomen and pelvis was performed following the standard protocol during bolus administration of intravenous contrast. CONTRAST:  160m OMNIPAQUE IOHEXOL 300 MG/ML  SOLN COMPARISON:  February 06, 2020 FINDINGS: CT CHEST FINDINGS Cardiovascular: RIGHT-sided Port-A-Cath in situ terminates at the caval to atrial junction. Heart size is normal. Aortic caliber is normal. No pericardial effusion. Central pulmonary vasculature is unremarkable on venous phase assessment. Mediastinum/Nodes: Thoracic inlet structures are normal. No axillary lymphadenopathy. No mediastinal lymphadenopathy. Fullness of bilateral hilar nodal tissue largest on the RIGHT on image 90 of series 6 approximately 1 cm. Lungs/Pleura: Mild basilar atelectasis. Airways are patent. Small nodule in the RIGHT upper lobe on image 54 of series 8 is nonspecific, not imaged on previous studies. Musculoskeletal: See below for full musculoskeletal details. CT ABDOMEN PELVIS FINDINGS Hepatobiliary: No focal, suspicious hepatic lesion. No pericholecystic stranding. No biliary duct dilation. Pancreas: Pancreas with mild atrophy. No focal lesion or ductal dilation. No sign of inflammation. Spleen: Normal size without suspicious lesion. Adrenals/Urinary Tract: Adrenal glands are normal. Moderate RIGHT-sided hydroureteronephrosis secondary to large mass in the pelvis which in compass is most of the bladder base, the prostate bed, bilateral seminal vesicles, anterior rectum and with in case mint of the RIGHT greater than LEFT ureters distally. No LEFT-sided hydronephrosis. Mass measuring 9.7 x 4.9 cm in the axial plane as compared to approximately 7.6 x 3.5 cm. Soft tissue extends  towards the pelvic floor. Enhancing soft tissue also involves the anterior rectum with increase in mesorectal soft tissue which now involves the LEFT anterior aspect of the rectum, previously RIGHT paramidline rectal involvement. This measures 5 cm greatest axial dimension on image 113 of series 2 previously 1.6 cm. Moderate RIGHT-sided hydroureteronephrosis is new compared to previous imaging, perhaps mild fullness on previous ultrasound. There is delayed enhancement on the RIGHT as compared to the LEFT particularly on delayed images with there is asymmetric excretion of contrast material Stomach/Bowel: Rectal involvement with soft tissue in the pelvis as described. No signs of bowel obstruction at this time. Normal appendix. Vascular/Lymphatic: Calcified atheromatous plaque, mild in the abdominal aorta. LEFT periaortic adenopathy with similar appearance 1.2 cm short axis as on the prior study. Enlarging LEFT common iliac lymph node, posterior common iliac on image 92 of series 2 at 1 cm not present on the previous study. Subcentimeter lymph nodes in the bilateral pelvis which are new as well with a measurable RIGHT pelvic sidewall lymph node and or soft tissue mass on image 106 of series 2 that measures 1.8 cm not present previously. Reproductive: Prostate as above with large amount of soft tissue invading adjacent structures in the pelvis. Other: Presacral nodule on image 109 of series 2 measuring 1.3 cm. Presacral stranding extending towards the operator select [employer] piriformis musculature bilaterally. Mesorectal and general pelvic fascial extending from the large pelvic mass. Musculoskeletal: Bony metastatic disease worse in the lower thoracic and lumbar spine. Large lesions at T12-T10 in the vertebral bodies with sclerosis showing similar appearance. L1 sclerotic foci and extensive sclerosis of the LEFT hemi sacrum. Scattered sclerotic lesions in the pelvis particularly bilateral ischial lesions with similar  appearance. Scattered small sclerotic foci throughout the thoracic spine at multiple levels also compatible with metastatic disease. IMPRESSION: 1. Interval enlargement of the large pelvic mass with worsening of pelvic visceral and ureteral involvement. 2. New moderate RIGHT-sided hydroureteronephrosis secondary to obstruction from the large  pelvic mass. Delayed excretion from the RIGHT kidney in the setting of obstruction. Renal sonogram may have showed mild fullness in January of the RIGHT renal collecting systems. There is now frank hydronephrosis. 3. Small nodule in the RIGHT upper lobe is nonspecific, not imaged on previous studies. Attention on follow-up. 4. Enlarging pelvic lymph nodes. 5. Diffuse bony metastasis worse in the lumbar spine and in the pelvis 6. Aortic atherosclerosis. These results will be called to the ordering clinician or representative by the Radiologist Assistant, and communication documented in the PACS or [employer]. Aortic Atherosclerosis (ICD10-I70.0). Electronically Signed   By: Zetta Bills M.D.   On: 06/16/2020 15:49      IMPRESSION/PLAN: 1. 70 y.o. male with a painful, enlarging pelvic mass secondary to progressive castrate resistant metastatic prostate cancer. Today, we talked to the patient and family about the findings and workup thus far. We discussed the natural history of castrate resistant metastatic prostate carcinoma and general treatment, highlighting the role of radiotherapy in the management. We discussed the available radiation techniques, and focused on the details and logistics of delivery.  The recommendation is for a 2-week course of palliative radiotherapy to shrink the prostate mass and alleviate the pelvic discomfort and ureteral obstruction.  We reviewed the anticipated acute and late sequelae associated with radiation in this setting.  He appears to have a good understanding of his disease and our treatment recommendations which are of palliative  intent.  The patient was encouraged to ask questions that were answered to his satisfaction.  At the conclusion of our conversation, the patient elects to proceed with the recommended 2-week course of palliative radiotherapy to the prostate mass.  He has freely signed written consent to proceed today in the office and a copy of this document will be placed in his medical record.  He is scheduled for CT simulation following our visit today in anticipation of beginning his daily treatments in the near future.  We will share our discussion with Dr. [name] and move forward with treatment planning accordingly.  We enjoyed meeting the patient and two of his daughters today and look forward to continuing to participate in his care.    Nicholos Johns, PA-C    Tyler Pita, MD  [facility] Direct Dial: [phone]  Fax: [phone] [email or web address]  Skype  [email or web address]

## 2020-06-19 NOTE — Telephone Encounter (Signed)
Patient was made aware and verbalized understanding. 

## 2020-06-19 NOTE — Progress Notes (Signed)
  Radiation Oncology         (336) [phone] ________________________________  Name: MACALISTER ARNAUD MRN: [id number]  Date: 06/19/2020  DOB: Feb 14, 1951  SIMULATION AND TREATMENT PLANNING NOTE    ICD-10-CM   1. Prostate cancer [facility])  C61     DIAGNOSIS:   70 year old male with a painful, enlarging pelvic mass secondary to progressive castrate resistant metastatic prostate cancer.  NARRATIVE:  The patient was brought to the [place].  Identity was confirmed.  All relevant records and images related to the planned course of therapy were reviewed.  The patient freely provided informed written consent to proceed with treatment after reviewing the details related to the planned course of therapy. The consent form was witnessed and verified by the simulation staff.  Then, the patient was set-up in a stable reproducible supine position for radiation therapy.  A vacuum lock pillow device was custom fabricated to position his legs in a reproducible immobilized position.   CT images were obtained.  Surface markings were placed.  The CT images were loaded into the planning software.  Then the prostate target and avoidance structures including the rectum, bladder, bowel and hips were contoured.  Treatment planning then occurred.  The radiation prescription was entered and confirmed.  A total of one complex treatment devices was fabricated. I have requested : isodose plan.  PLAN:  The patient will receive 30 Gy in 10 fractions.  ________________________________  Sheral Apley Tammi Klippel, M.D.

## 2020-06-19 NOTE — Progress Notes (Signed)
Histology and Location of Primary Cancer: Prostatic adenocarcinoma diagnosed December 2020, Gleason 4+5=9, psa 366  Location(s) of Symptomatic tumor(s): Enlarging 9.7X4.9 cm prostate mass causing hydroureteronephrosis  Past/Anticipated chemotherapy by medical oncology, if any:   Prior Therapy: He is status post prostate biopsy completed on December 3 of 2020. Pathology showed a Gleason score 4+5 = 9 with 12 out of 12 cores. He is status post both of radiation therapy to the spine between T9 and L1.  He received 30 Gray in 10 fractions between January 15 and 28, 2021. Firmagon 240 mg started on May 31, 2019. Zytiga 1000 mg daily with prednisone 5 mg daily started in May 2021. Therapy discontinued in October of 2021 due to progression of disease.  Current therapy:  Eligard every 4 months with next injection will be given in March 2022. Taxotere chemotherapy with 75 mg per metered squared every 3 weeks started in October 2021.  The dose was reduced to 60 mg per metered square after 3 cycles.   He completed 5 cycles of therapy.  Patient's main complaints related to symptomatic tumor(s) are: increasing pelvic discomfort, difficulty urination at times, mild nausea  Pain on a scale of 0-10 is: Reports back, left knee, right knee and pelvic pain. Explains it is uncomfortable for him to sit.  Reports hot flashes, poor appetite, and no taste.    If Spine Met(s), symptoms, if any, include:  Bowel/Bladder retention or incontinence (please describe): Reports mild nausea. Denies vomiting. Reports constipation. Reports urinary urgency. Reports less urinary leakage this week. Reports nocturia x 4-5. Reports dysuria. Denies hematuria.   Numbness or weakness in extremities (please describe): Reports increasing neuropathy in his hands and feet  Current Decadron regimen, if applicable: Prednisone with Zytiga  Ambulatory status? Walker? Wheelchair?: wheelchair  SAFETY ISSUES:  Prior radiation?  Yes, hx of xrt to T9 and L1 completed 05/04/19  Pacemaker/ICD? denies  Possible current pregnancy? No, male patient  Is the patient on methotrexate? no  Additional Complaints / other details:  70 year old male.

## 2020-06-20 ENCOUNTER — Ambulatory Visit: Payer: Medicare Other

## 2020-06-23 NOTE — Patient Instructions (Incomplete)
It was great to see you! Thank you for allowing me to participate in your care!  I recommend that you always bring your medications to each appointment as this makes it easy to ensure we are on the correct medications and helps [place] not miss when refills are needed.  Our plans for today:  - *** -   We are checking some labs today, I will call you if they are abnormal will send you a MyChart message or a letter if they are normal.  If you do not hear about your labs in the next 2 weeks please let us know.***  Take care and seek immediate care sooner if you develop any concerns.   Dr. [name], [place]

## 2020-06-23 NOTE — Progress Notes (Deleted)
    SUBJECTIVE:   CHIEF COMPLAINT / HPI:   Yearly checkup: Patient is a 70 year old male presenting today for yearly checkup.  Today he states***.  Past medical history includes metastatic prostate cancer with metastasis to his spine for which he follows with oncology.  He also has a history of drug-induced constipation secondary to some of the pain medications he is required due to this. Previously following with his oncologist and had a repeat CT scan and blood work performed last week which showed hydronephrosis.  Patient also had worsening creatinine at 1.3.  Per the oncologist note patient was referred to urology due to his hydronephrosis from pelvic mass.  PERTINENT  PMH / PSH: ***  OBJECTIVE:   There were no vitals taken for this visit.   General: NAD, pleasant, able to participate in exam Cardiac: RRR, no murmurs. Respiratory: CTAB, normal effort, No wheezes, rales or rhonchi Abdomen: Bowel sounds present, nontender, nondistended, no hepatosplenomegaly. Extremities: no edema or cyanosis. Skin: warm and dry, no rashes noted Neuro: alert, no obvious focal deficits Psych: Normal affect and mood  ASSESSMENT/PLAN:   No problem-specific Assessment & Plan notes found for this encounter.   Will check cholesterol panel  [name], DO [place]    This note was prepared using Dragon voice recognition software and may include unintentional dictation errors due to the inherent limitations of voice recognition software.  {    This will disappear when note is signed, click to select method of visit    :1}

## 2020-06-24 ENCOUNTER — Ambulatory Visit: Payer: Medicare Other | Admitting: Family Medicine

## 2020-06-24 DIAGNOSIS — Z510 Encounter for antineoplastic radiation therapy: Secondary | ICD-10-CM | POA: Diagnosis not present

## 2020-06-27 ENCOUNTER — Other Ambulatory Visit: Payer: Self-pay

## 2020-06-27 ENCOUNTER — Ambulatory Visit
Admission: RE | Admit: 2020-06-27 | Discharge: 2020-06-27 | Disposition: A | Discharge status: Home / Self Care | Payer: Medicare Other | Source: Ambulatory Visit | Attending: Radiation Oncology | Admitting: Radiation Oncology

## 2020-06-27 DIAGNOSIS — Z510 Encounter for antineoplastic radiation therapy: Secondary | ICD-10-CM | POA: Diagnosis not present

## 2020-06-27 NOTE — Patient Instructions (Signed)
Thank you for speaking with me today regarding care management and care coordination needs.   Mickel Fuchs, BSW, [place]  High Risk Managed Medicaid Team

## 2020-06-27 NOTE — Patient Outreach (Signed)
Medicaid Managed Care Social Work Note  06/27/2020 Name:  Andre Glass MRN:  [id number] DOB:  10/18/1950  SOTA HETZ is an 70 y.o. year old male who is a primary patient of Andre Del, DO.  The Medicaid Managed Care Coordination team was consulted for assistance with:  Andre Glass, housing, transportation  Mr. Casagrande was given information about Medicaid Managed CareCoordination services today. Andre Glass agreed to services and verbal consent obtained.  Engaged with patient  for by telephone forinitial visit in response to referral for case management and/or care coordination services.   Assessments/Interventions:  Review of past medical history, allergies, medications, health status, including review of consultants reports, laboratory and other test data, was performed as part of comprehensive evaluation and provision of chronic care management services.  SDOH: (Social Determinant of Health) assessments and interventions performed:  BSW contacted patient to inquire about survey. Patient stated he no longer had [employer]. BSW asked if patient needed any assistance with food, transportation or housing. Patient stated that he does not have transportation however he does ask for rides from friends and family members. Patient has an aide that comes in 7 days a week and cooks his meals. [place] does not qualify for MOW. Patient stated he is okay with housing, there is a few things he needs to talk to the landlord about to get fixed.   Advanced Directives Status:  Not addressed in this encounter.  Care Plan                 Allergies  Allergen Reactions  . Ibuprofen Anaphylaxis  . Shrimp [Shellfish Allergy] Anaphylaxis    Medications Reviewed Today    Reviewed by Tyler Pita, MD (Physician) on 06/19/20 at 1529  Med List Status: <None>  Medication Order Taking? Sig Documenting Provider Last Dose Status Informant  abiraterone acetate (ZYTIGA) 250 MG tablet [id number] Yes  Take 4 tablets (1,000 mg total) by mouth daily. Take on an empty stomach 1 hour before or 2 hours after a meal [name] Mathis Dad, MD Taking Active Pharmacy Records           Med Note Burt Knack, [place] A   Tue Feb 06, 2020  1:53 AM) LF 9/20, 30 day supply   Ferrous Sulfate (IRON PO) [id number] Yes Take 1 tablet by mouth daily. [provider] Taking Active Pharmacy Records  heparin lock flush 100 unit/mL [id number]   [name], MD  Active   HYDROmorphone (DILAUDID) 4 MG tablet [id number] Yes Take 1 tablet (4 mg total) by mouth every 4 (four) hours as needed for severe pain. [name], MD Taking Active   lidocaine-prilocaine (EMLA) cream [id number] Yes Apply 1 application topically as needed. [name], MD Taking Active Pharmacy Records           Med Note Fransico Him Jan 18, 2020 10:19 AM) Not needed yet, no port placed  morphine (MS CONTIN) 30 MG 12 hr tablet [id number] Yes Take 1 tablet (30 mg total) by mouth every 8 (eight) hours. [name], MD Taking Active   Multiple Vitamin (MULTIVITAMIN WITH MINERALS) TABS tablet [id number] Yes Take 1 tablet by mouth daily. [provider] Taking Active Pharmacy Records  NONFORMULARY OR COMPOUNDED ITEM [id number] Yes Apply 1-2 g topically 4 (four) times daily. Marzetta Board, DPM Taking Active   ondansetron (ZOFRAN) 4 MG tablet [id number] Yes Take 1 tablet (4 mg total) by mouth 3 (three) times  daily as needed for nausea or vomiting. [name], MD Taking Active   oxybutynin (DITROPAN) 5 MG tablet [id number] Yes Take 5 mg by mouth 3 (three) times daily as needed for bladder spasms. [provider] Taking Active Pharmacy Records           Med Note Burt Knack, [place] A   Tue Feb 06, 2020  4:40 AM) Takes PRN  Plecanatide (TRULANCE) 3 MG TABS [id number] Yes Take 3 mg by mouth daily. Andre Del, DO Taking Active Pharmacy Records           Med Note Burt Knack, ERICIA A   Tue Feb 06, 2020  1:56 AM) LF  10/27/2019, 30 day supply   polyethylene glycol (MIRALAX) 17 g packet [id number] Yes Take 17 g by mouth 2 (two) times daily. Modena Jansky, MD Taking Active Pharmacy Records           Med Note Burt Knack, [place] A   Tue Feb 06, 2020  1:56 AM) USED PRN   predniSONE (DELTASONE) 5 MG tablet [id number] Yes Take 1 tablet (5 mg total) by mouth daily with breakfast. [name], MD Taking Active Pharmacy Records  prochlorperazine (COMPAZINE) 10 MG tablet [id number] Yes Take 1 tablet (10 mg total) by mouth every 6 (six) hours as needed for nausea or vomiting. [name], MD Taking Active Pharmacy Records           Med Note Burt Knack, [place] A   Tue Feb 06, 2020  1:57 AM) LF 01/15/2020  senna (SENOKOT) 8.6 MG TABS tablet [id number] Yes Take 2 tablets by mouth daily. [provider] Taking Active Pharmacy Records           Med Note Burt Knack, ERICIA A   Tue Feb 06, 2020  4:41 AM)    sodium chloride flush (NS) 0.9 % injection 10 mL [id number]   [name], MD  Active   tamsulosin [facility]) 0.4 MG CAPS capsule [id number] Yes Take 1 capsule (0.4 mg total) by mouth daily after supper.  Patient taking differently: Take 0.4 mg by mouth daily as needed (to help with urination).   Andre Del, DO Taking Active   Med List Note Darl Pikes, RPH-CPP 08/01/19 1232): Zytiga filled at [place]          Patient Active Problem List   Diagnosis Date Noted  . Prostate cancer ([place])   . Intractable pain 01/18/2020  . BPH (benign prostatic hyperplasia) 11/08/2019  . Catheter-associated urinary tract infection ([place]) 09/14/2019  . Drug induced constipation 07/18/2019  . Goals of care, counseling/discussion 06/26/2019  . 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 Metastatic castration-resistant adenocarcinoma of prostate ([place]) 04/19/2019    Conditions to be addressed/monitored per PCP order:  resources  There are no care plans that you recently modified to display for this  patient.   Follow up:  Patient no longer has MM  Plan: The patient has been provided with contact information for the Managed Medicaid care management team and has been advised to call with any health related questions or concerns.    Mickel Fuchs, BSW, [place]  High Risk Managed Medicaid Team

## 2020-06-28 ENCOUNTER — Ambulatory Visit
Admission: RE | Admit: 2020-06-28 | Discharge: 2020-06-28 | Disposition: A | Discharge status: Home / Self Care | Payer: Medicare Other | Source: Ambulatory Visit | Attending: Radiation Oncology | Admitting: Radiation Oncology

## 2020-06-28 ENCOUNTER — Other Ambulatory Visit: Payer: Self-pay

## 2020-06-28 DIAGNOSIS — Z510 Encounter for antineoplastic radiation therapy: Secondary | ICD-10-CM | POA: Diagnosis not present

## 2020-06-28 LAB — GUARDANT 360

## 2020-07-01 ENCOUNTER — Ambulatory Visit: Payer: Medicare Other

## 2020-07-02 ENCOUNTER — Other Ambulatory Visit: Payer: Self-pay

## 2020-07-02 ENCOUNTER — Ambulatory Visit
Admission: RE | Admit: 2020-07-02 | Discharge: 2020-07-02 | Disposition: A | Discharge status: Home / Self Care | Payer: Medicare Other | Source: Ambulatory Visit | Attending: Radiation Oncology | Admitting: Radiation Oncology

## 2020-07-02 DIAGNOSIS — Z510 Encounter for antineoplastic radiation therapy: Secondary | ICD-10-CM | POA: Diagnosis not present

## 2020-07-03 ENCOUNTER — Ambulatory Visit
Admission: RE | Admit: 2020-07-03 | Discharge: 2020-07-03 | Disposition: A | Discharge status: Home / Self Care | Payer: Medicare Other | Source: Ambulatory Visit | Attending: Radiation Oncology | Admitting: Radiation Oncology

## 2020-07-03 ENCOUNTER — Other Ambulatory Visit: Payer: Self-pay

## 2020-07-03 DIAGNOSIS — Z510 Encounter for antineoplastic radiation therapy: Secondary | ICD-10-CM | POA: Diagnosis not present

## 2020-07-04 ENCOUNTER — Ambulatory Visit
Admission: RE | Admit: 2020-07-04 | Discharge: 2020-07-04 | Disposition: A | Discharge status: Home / Self Care | Payer: Medicare Other | Source: Ambulatory Visit | Attending: Radiation Oncology | Admitting: Radiation Oncology

## 2020-07-04 ENCOUNTER — Other Ambulatory Visit: Payer: Self-pay

## 2020-07-04 DIAGNOSIS — C61 Malignant neoplasm of prostate: Secondary | ICD-10-CM | POA: Diagnosis not present

## 2020-07-04 DIAGNOSIS — R310 Gross hematuria: Secondary | ICD-10-CM

## 2020-07-04 DIAGNOSIS — Z510 Encounter for antineoplastic radiation therapy: Secondary | ICD-10-CM | POA: Diagnosis not present

## 2020-07-04 LAB — URINALYSIS, COMPLETE (UACMP) WITH MICROSCOPIC
Bacteria, UA: NONE SEEN
Bilirubin Urine: NEGATIVE
Glucose, UA: NEGATIVE mg/dL
Ketones, ur: NEGATIVE mg/dL
Leukocytes,Ua: NEGATIVE
Nitrite: NEGATIVE
Protein, ur: 100 mg/dL — AB
RBC / HPF: >50 RBC/hpf — ABNORMAL HIGH (ref 0–5)
Specific Gravity, Urine: 1.013 (ref 1.005–1.030)
WBC, UA: >50 WBC/hpf — ABNORMAL HIGH (ref 0–5)
pH: 6 (ref 5.0–8.0)

## 2020-07-04 NOTE — Progress Notes (Signed)
Received patient in the clinic following his fifth of ten intended treatments. Patient reports new onset hematuria that began yesterday and continues into today. Patient reports dysuria. Patient reports he isn't having to strain as much to begin urination. Reports nocturia x 4-5. Reports his urine stream is strong with new onset dribbling at the end to completely empty.  Per Dr. Johny Shears order I collected urine for analysis and culture. Patient understands this RN will contact him via phone with his results.

## 2020-07-05 ENCOUNTER — Telehealth: Payer: Self-pay | Admitting: Radiation Oncology

## 2020-07-05 ENCOUNTER — Other Ambulatory Visit: Payer: Self-pay

## 2020-07-05 ENCOUNTER — Ambulatory Visit
Admission: RE | Admit: 2020-07-05 | Discharge: 2020-07-05 | Disposition: A | Discharge status: Home / Self Care | Payer: Medicare Other | Source: Ambulatory Visit | Attending: Radiation Oncology | Admitting: Radiation Oncology

## 2020-07-05 DIAGNOSIS — Z510 Encounter for antineoplastic radiation therapy: Secondary | ICD-10-CM | POA: Insufficient documentation

## 2020-07-05 DIAGNOSIS — C61 Malignant neoplasm of prostate: Secondary | ICD-10-CM | POA: Insufficient documentation

## 2020-07-05 LAB — URINE CULTURE: Culture: NO GROWTH

## 2020-07-05 MED ORDER — CIPROFLOXACIN HCL 500 MG PO TABS: 500.0000 mg | ORAL_TABLET | Freq: Two times a day (BID) | ORAL | 0 refills | Status: DC

## 2020-07-05 NOTE — Telephone Encounter (Signed)
Phoned both the patient and his friend, Dola Argyle. Neither answered. Left voicemail message for both that antibiotics have been sent for Mr. Brach to [facility] on [address]. Explained on the voicemail he should begin taking this medication as directed on the bottle today and I will follow up with him on Monday. Provided my direct number for further needs or questions.

## 2020-07-05 NOTE — Telephone Encounter (Signed)
-----   Message from Tyler Pita, MD sent at 07/05/2020  9:56 AM EDT ----- eRx'd  Cipro [facility] his [facility].  Please let him know.  Culture pending?

## 2020-07-08 ENCOUNTER — Ambulatory Visit
Admission: RE | Admit: 2020-07-08 | Discharge: 2020-07-08 | Disposition: A | Discharge status: Home / Self Care | Payer: Medicare Other | Source: Ambulatory Visit | Attending: Radiation Oncology | Admitting: Radiation Oncology

## 2020-07-08 DIAGNOSIS — Z510 Encounter for antineoplastic radiation therapy: Secondary | ICD-10-CM | POA: Diagnosis not present

## 2020-07-09 ENCOUNTER — Other Ambulatory Visit: Payer: Self-pay

## 2020-07-09 ENCOUNTER — Ambulatory Visit
Admission: RE | Admit: 2020-07-09 | Discharge: 2020-07-09 | Disposition: A | Discharge status: Home / Self Care | Payer: Medicare Other | Source: Ambulatory Visit | Attending: Radiation Oncology | Admitting: Radiation Oncology

## 2020-07-09 ENCOUNTER — Ambulatory Visit (INDEPENDENT_AMBULATORY_CARE_PROVIDER_SITE_OTHER): Payer: Medicare Other | Admitting: Podiatry

## 2020-07-09 DIAGNOSIS — B351 Tinea unguium: Secondary | ICD-10-CM | POA: Diagnosis not present

## 2020-07-09 DIAGNOSIS — M79675 Pain in left toe(s): Secondary | ICD-10-CM

## 2020-07-09 DIAGNOSIS — M792 Neuralgia and neuritis, unspecified: Secondary | ICD-10-CM

## 2020-07-09 DIAGNOSIS — M79674 Pain in right toe(s): Secondary | ICD-10-CM

## 2020-07-09 DIAGNOSIS — Z510 Encounter for antineoplastic radiation therapy: Secondary | ICD-10-CM | POA: Diagnosis not present

## 2020-07-09 NOTE — Progress Notes (Signed)
Subjective: Andre Glass presents today referred by [name], DO for complaint of painful thick toenails that are difficult to trim. Pain interferes with ambulation. Aggravating factors include wearing enclosed shoe gear. Pain is relieved with periodic professional debridement.   He states he did use neuropathy cream twice a day. He only used it for one week.  Allergies  Allergen Reactions  . Ibuprofen Anaphylaxis  . Shrimp [Shellfish Allergy] Anaphylaxis    Objective: Andre Glass is a pleasant 70 y.o. male frail, in NAD. AAO x 3.  There were no vitals filed for this visit.  Vascular Examination:  Capillary refill time to digits immediate b/l. Palpable pedal pulses b/l LE. Pedal hair sparse. Lower extremity skin temperature gradient within normal limits. No pain with calf compression b/l.  Dermatological Examination: Pedal skin with normal turgor, texture and tone bilaterally. No open wounds bilaterally. No interdigital macerations bilaterally. Toenails 1-5 b/l elongated, discolored, dystrophic, thickened, crumbly with subungual debris and tenderness to dorsal palpation. Pedal skin noted to be dry and flaky b/l lower extremities.  Musculoskeletal: Normal muscle strength 5/5 to all lower extremity muscle groups bilaterally. No pain crepitus or joint limitation noted with ROM b/l. Hallux valgus with bunion deformity noted b/l lower extremities.  Neurological: Pt has subjective symptoms of neuropathy. Protective sensation intact 5/5 intact bilaterally with 10g monofilament b/l. Vibratory sensation intact b/l.  Assessment: 1. Pain due to onychomycosis of toenails of both feet   2. Neuropathic pain    Plan: -Examined patient. -Patient to continue soft, supportive shoe gear daily. -We discussed continuing topical compounded neuropathy cream. I have asked him to apply three times a day: after lunch, after dinner, and before bedtime. -Toenails 1-5 b/l were debrided in length and girth  with sterile nail nippers and dremel without iatrogenic bleeding.  -Discussed using Aquaphor Ointment daily to feet.  -Patient to report any pedal injuries to medical professional immediately. -Patient/POA to call should there be question/concern in the interim.  Return in about 3 months (around 10/08/2020).  Marzetta Board, DPM

## 2020-07-10 ENCOUNTER — Ambulatory Visit
Admission: RE | Admit: 2020-07-10 | Discharge: 2020-07-10 | Disposition: A | Discharge status: Home / Self Care | Payer: Medicare Other | Source: Ambulatory Visit | Attending: Radiation Oncology | Admitting: Radiation Oncology

## 2020-07-10 DIAGNOSIS — Z510 Encounter for antineoplastic radiation therapy: Secondary | ICD-10-CM | POA: Diagnosis not present

## 2020-07-11 ENCOUNTER — Encounter: Payer: Self-pay | Admitting: Urology

## 2020-07-11 ENCOUNTER — Other Ambulatory Visit: Payer: Self-pay

## 2020-07-11 ENCOUNTER — Ambulatory Visit
Admission: RE | Admit: 2020-07-11 | Discharge: 2020-07-11 | Disposition: A | Discharge status: Home / Self Care | Payer: Medicare Other | Source: Ambulatory Visit | Attending: Radiation Oncology | Admitting: Radiation Oncology

## 2020-07-11 DIAGNOSIS — Z510 Encounter for antineoplastic radiation therapy: Secondary | ICD-10-CM | POA: Diagnosis not present

## 2020-07-14 ENCOUNTER — Encounter: Payer: Self-pay | Admitting: Podiatry

## 2020-07-18 ENCOUNTER — Inpatient Hospital Stay: Payer: Medicare Other

## 2020-07-18 ENCOUNTER — Inpatient Hospital Stay: Payer: Medicare Other | Attending: Oncology | Admitting: Oncology

## 2020-07-18 ENCOUNTER — Other Ambulatory Visit: Payer: Self-pay

## 2020-07-18 VITALS — BP 139/75 | HR 60 | Temp 98.7°F | Resp 17

## 2020-07-18 VITALS — BP 157/70 | HR 54 | Temp 97.0°F | Resp 16 | Ht 68.0 in | Wt 199.5 lb

## 2020-07-18 DIAGNOSIS — C61 Malignant neoplasm of prostate: Secondary | ICD-10-CM | POA: Insufficient documentation

## 2020-07-18 DIAGNOSIS — Z79899 Other long term (current) drug therapy: Secondary | ICD-10-CM | POA: Diagnosis not present

## 2020-07-18 DIAGNOSIS — Z923 Personal history of irradiation: Secondary | ICD-10-CM | POA: Diagnosis not present

## 2020-07-18 DIAGNOSIS — Z79818 Long term (current) use of other agents affecting estrogen receptors and estrogen levels: Secondary | ICD-10-CM | POA: Diagnosis not present

## 2020-07-18 DIAGNOSIS — Z7952 Long term (current) use of systemic steroids: Secondary | ICD-10-CM | POA: Insufficient documentation

## 2020-07-18 DIAGNOSIS — C7951 Secondary malignant neoplasm of bone: Secondary | ICD-10-CM | POA: Diagnosis not present

## 2020-07-18 DIAGNOSIS — N1330 Unspecified hydronephrosis: Secondary | ICD-10-CM | POA: Insufficient documentation

## 2020-07-18 LAB — CBC WITH DIFFERENTIAL (CANCER CENTER ONLY)
Abs Immature Granulocytes: 0.03 10*3/uL (ref 0.00–0.07)
Basophils Absolute: 0 10*3/uL (ref 0.0–0.1)
Basophils Relative: 1 %
Eosinophils Absolute: 0.5 10*3/uL (ref 0.0–0.5)
Eosinophils Relative: 7 %
HCT: 27 % — ABNORMAL LOW (ref 39.0–52.0)
Hemoglobin: 8.3 g/dL — ABNORMAL LOW (ref 13.0–17.0)
Immature Granulocytes: 1 %
Lymphocytes Relative: 17 %
Lymphs Abs: 1 10*3/uL (ref 0.7–4.0)
MCH: 28.2 pg (ref 26.0–34.0)
MCHC: 30.7 g/dL (ref 30.0–36.0)
MCV: 91.8 fL (ref 80.0–100.0)
Monocytes Absolute: 0.6 10*3/uL (ref 0.1–1.0)
Monocytes Relative: 10 %
Neutro Abs: 4 10*3/uL (ref 1.7–7.7)
Neutrophils Relative %: 64 %
Platelet Count: 215 10*3/uL (ref 150–400)
RBC: 2.94 MIL/uL — ABNORMAL LOW (ref 4.22–5.81)
RDW: 15.2 % (ref 11.5–15.5)
WBC Count: 6.1 10*3/uL (ref 4.0–10.5)
nRBC: 0 % (ref 0.0–0.2)

## 2020-07-18 LAB — CMP (CANCER CENTER ONLY)
ALT: 12 U/L (ref 0–44)
AST: 19 U/L (ref 15–41)
Albumin: 2.9 g/dL — ABNORMAL LOW (ref 3.5–5.0)
Alkaline Phosphatase: 79 U/L (ref 38–126)
Anion gap: 9 (ref 5–15)
BUN: 24 mg/dL — ABNORMAL HIGH (ref 8–23)
CO2: 27 mmol/L (ref 22–32)
Calcium: 9 mg/dL (ref 8.9–10.3)
Chloride: 106 mmol/L (ref 98–111)
Creatinine: 1 mg/dL (ref 0.61–1.24)
GFR, Estimated: >60 mL/min (ref >60)
Glucose, Bld: 86 mg/dL (ref 70–99)
Potassium: 3.8 mmol/L (ref 3.5–5.1)
Sodium: 142 mmol/L (ref 135–145)
Total Bilirubin: <0.2 mg/dL — ABNORMAL LOW (ref 0.3–1.2)
Total Protein: 7.5 g/dL (ref 6.5–8.1)

## 2020-07-18 MED ORDER — LEUPROLIDE ACETATE (4 MONTH) 30 MG ~~LOC~~ KIT
30.0000 mg | PACK | Freq: Once | SUBCUTANEOUS | Status: AC
  Administered 2020-07-18: 30 mg via SUBCUTANEOUS

## 2020-07-18 MED ORDER — SODIUM CHLORIDE 0.9% FLUSH
10.0000 mL | INTRAVENOUS | Status: AC | PRN
  Administered 2020-07-18: 10 mL
  Filled 2020-07-18: qty 10

## 2020-07-18 MED ORDER — HEPARIN SOD (PORK) LOCK FLUSH 100 UNIT/ML IV SOLN
500.0000 [IU] | INTRAVENOUS | Status: AC | PRN
  Administered 2020-07-18: 500 [IU]
  Filled 2020-07-18: qty 5

## 2020-07-18 MED ORDER — LEUPROLIDE ACETATE (4 MONTH) 30 MG ~~LOC~~ KIT
PACK | SUBCUTANEOUS | Status: AC
  Filled 2020-07-18: qty 30

## 2020-07-18 NOTE — Progress Notes (Signed)
Hematology and Oncology Follow Up Visit  Andre Glass [id number] [birth year] 70 y.o. 07/18/2020 11:32 AM Andre Glass, DOWelborn, [name], DO   Principle Diagnosis: 69 year old man with castration-resistant advanced prostate cancer with disease to the bone diagnosed in December 2020.  He was found to have Gleason score 4+5 = 9 and a PSA of 366.    Prior Therapy: He is status post prostate biopsy completed on December 3 of 2020.  Pathology showed a Gleason score 4+5 = 9 with 12 out of 12 cores.  He is status post both of radiation therapy to the spine between T9 and L1.  He received 30 Gray in 10 fractions between January 15 and 28, 2021.   Firmagon 240 mg started on May 31, 2019.  Zytiga 1000 mg daily with prednisone 5 mg daily started in May 2021. Therapy discontinued in October of 2021 due to progression of disease.  Taxotere chemotherapy with 75 mg per metered squared every 3 weeks started in October 2021.  The dose was reduced to 60 mg per metered square after 3 cycles.   He completed 5 cycles of therapy.  He is status post radiation therapy to enlarging pelvic prostate cancer mass.  He received 30 Gray in 10 fractions completed April 2022.   Current therapy:   Eligard every 4 months.  He will receive Eligard today and repeated in 4 months.    Interim History: Ms. Andre Glass returns today for a follow-up visit.  Since the last visit, he completed radiation therapy without any major complications.  He has a reported improvement in his pelvic discomfort and overall wellbeing.  He is requiring less pain medication at this time.  He is eating better and gained more weight.  His performance status and quality of life remains marginal but has not declined.   Medications: Unchanged on review. Current Outpatient Medications  Medication Sig Dispense Refill  .      Marland Kitchen Ferrous Sulfate (IRON PO) Take 1 tablet by mouth daily.    Marland Kitchen HYDROmorphone (DILAUDID) 4 MG tablet Take 1 tablet (4 mg total)  by mouth every 4 (four) hours as needed for severe pain. 60 tablet 0  . lidocaine-prilocaine (EMLA) cream Apply 1 application topically as needed. 30 g 0  . morphine (MS CONTIN) 30 MG 12 hr tablet Take 1 tablet (30 mg total) by mouth every 8 (eight) hours. 90 tablet 0  . Multiple Vitamin (MULTIVITAMIN WITH MINERALS) TABS tablet Take 1 tablet by mouth daily.    . ondansetron (ZOFRAN) 4 MG tablet TAKE 1 TABLET (4 MG TOTAL) BY MOUTH 3 (THREE) TIMES DAILY. (Patient taking differently: Take 4 mg by mouth 3 (three) times daily as needed for nausea or vomiting. ) 20 tablet 0  . oxybutynin (DITROPAN) 5 MG tablet Take 5 mg by mouth 3 (three) times daily as needed for bladder spasms.    . Plecanatide (TRULANCE) 3 MG TABS Take 3 mg by mouth daily. 30 tablet 2  . polyethylene glycol (MIRALAX) 17 g packet Take 17 g by mouth 2 (two) times daily. 60 each 0  . predniSONE (DELTASONE) 5 MG tablet Take 1 tablet (5 mg total) by mouth daily with breakfast. 90 tablet 3  . prochlorperazine (COMPAZINE) 10 MG tablet Take 1 tablet (10 mg total) by mouth every 6 (six) hours as needed for nausea or vomiting. 30 tablet 0  . senna (SENOKOT) 8.6 MG TABS tablet Take 2 tablets by mouth daily.     . tamsulosin (FLOMAX) 0.4 MG  CAPS capsule Take 1 capsule (0.4 mg total) by mouth daily after supper. (Patient taking differently: Take 0.4 mg by mouth daily as needed (to help with urination). ) 30 capsule 3   No current facility-administered medications for this visit.     Allergies:  Allergies  Allergen Reactions  . Ibuprofen Anaphylaxis  . Shrimp [Shellfish Allergy] Anaphylaxis       Physical Exam:      Blood pressure (!) 157/70, pulse (!) 54, temperature (!) 97 F (36.1 C), temperature source Tympanic, resp. rate 16, height 5\' 8"  (1.727 m), weight 199 lb 8 oz (90.5 kg), SpO2 100 %.      ECOG: 2    General appearance: Alert, awake without any distress. Head: Atraumatic without abnormalities Oropharynx:  Without any thrush or ulcers. Eyes: No scleral icterus. Lymph nodes: No lymphadenopathy noted in the cervical, supraclavicular, or axillary nodes Heart:regular rate and rhythm, without any murmurs or gallops.   Lung: Clear to auscultation without any rhonchi, wheezes or dullness to percussion. Abdomin: Soft, nontender without any shifting dullness or ascites. Musculoskeletal: No clubbing or cyanosis. Neurological: No motor or sensory deficits. Skin: No rashes or lesions.                     Lab Results: Lab Results  Component Value Date   WBC 4.4 06/18/2020   HGB 8.6 (L) 06/18/2020   HCT 27.0 (L) 06/18/2020   MCV 92.8 06/18/2020   PLT 327 06/18/2020     Chemistry      Component Value Date/Time   NA 138 06/18/2020 1251   K 4.1 06/18/2020 1251   CL 104 06/18/2020 1251   CO2 26 06/18/2020 1251   BUN 14 06/18/2020 1251   CREATININE 1.30 (H) 06/18/2020 1251      Component Value Date/Time   CALCIUM 9.2 06/18/2020 1251   ALKPHOS 75 06/18/2020 1251   AST 16 06/18/2020 1251   ALT 10 06/18/2020 1251   BILITOT 0.3 06/18/2020 1251          Results for Andre Glass (MRN [id number]) as of 07/18/2020 11:36  Ref. Range 05/07/2020 12:33 05/29/2020 10:37 06/18/2020 12:51  Prostate Specific Ag, Serum Latest Ref Range: 0.0 - 4.0 ng/mL 27.2 (H) 34.4 (H) 74.4 (H)        Impression and Plan:   70 year old man with:  1.    Castration-resistant advanced prostate cancer diagnosed in December 2020.     His disease status was updated at this time and treatment options were reviewed.  He has completed radiation therapy without any major complications.  Different salvage therapy options including Jevtana chemotherapy, Trudi Ida among others were reviewed.  At this time a we will give him a period of observation in recovery and repeat imaging studies in the future and determine best course of action accordingly.  I will arrange for PSMA imaging before the next visit.  He is  agreeable to proceed.  2.  Androgen deprivation therapy: He is currently on Eligard will be repeated every 4 months.  Complications include weight gain, hot flashes among others were reviewed.  3.  Bone directed therapy: He will continue calcium and vitamin D supplements.  Delton See has been deferred due to dental clearance issues.  4.  Bone pain: Improved at this time and uses less breakthrough Dilaudid.   5.  IV access: Port-A-Cath will remain in place and will be flushed periodically.  6.  Hydronephrosis: Related to his prostate cancer and will continue to  monitor kidney function.  Follow-up with urology will be required for management options.  7.  Anemia: Related to his malignancy as well as chemotherapy.  Hemoglobin is stable and does not require any transfusion.  8.  Follow-up: In the next 6 to 8 weeks to follow his progress.    30  minutes were spent on this visit.  The time was dedicated to reviewing his disease status, reviewing laboratory data and future plan of care review.   Zola Button, MD 4/14/202211:32 AM

## 2020-07-19 LAB — PROSTATE-SPECIFIC AG, SERUM (LABCORP): Prostate Specific Ag, Serum: 85.6 ng/mL — ABNORMAL HIGH (ref 0.0–4.0)

## 2020-07-20 NOTE — Patient Instructions (Incomplete)
It was great to see you! Thank you for allowing me to participate in your care!  I recommend that you always bring your medications to each appointment as this makes it easy to ensure we are on the correct medications and helps [place] not miss when refills are needed.  Our plans for today:  - *** -   We are checking some labs today, I will call you if they are abnormal will send you a MyChart message or a letter if they are normal.  If you do not hear about your labs in the next 2 weeks please let us know.***  Take care and seek immediate care sooner if you develop any concerns.   Dr. [name], [place]

## 2020-07-20 NOTE — Progress Notes (Deleted)
    SUBJECTIVE:   CHIEF COMPLAINT / HPI:   Checkup: Patient is a 70 year old male presenting today for a checkup. Patient has a history of metastatic adenocarcinoma of the prostate with metastasis to the spine.  He is followed by oncology for this.***  PERTINENT  PMH / PSH: ***  OBJECTIVE:   There were no vitals taken for this visit.   General: NAD, pleasant, able to participate in exam Cardiac: RRR, no murmurs. Respiratory: CTAB, normal effort, No wheezes, rales or rhonchi Abdomen: Bowel sounds present, nontender, nondistended, no hepatosplenomegaly. Extremities: no edema or cyanosis. Skin: warm and dry, no rashes noted Neuro: alert, no obvious focal deficits Psych: Normal affect and mood  ASSESSMENT/PLAN:   No problem-specific Assessment & Plan notes found for this encounter.     [name], [place]    This note was prepared using Dragon voice recognition software and may include unintentional dictation errors due to the inherent limitations of voice recognition software.  {    This will disappear when note is signed, click to select method of visit    :1}

## 2020-07-22 ENCOUNTER — Other Ambulatory Visit: Payer: Self-pay

## 2020-07-22 MED ORDER — ONDANSETRON HCL 4 MG PO TABS
4.0000 mg | ORAL_TABLET | Freq: Three times a day (TID) | ORAL | 1 refills | Status: DC | PRN
Start: 2020-07-22 — End: 2020-09-24

## 2020-07-22 NOTE — Telephone Encounter (Signed)
Pt left message requesting refills on Hydromorphone and MS Contin. Pt stated he was having increased pain in his lower back and legs.

## 2020-07-23 ENCOUNTER — Other Ambulatory Visit: Payer: Self-pay | Admitting: Oncology

## 2020-07-23 ENCOUNTER — Ambulatory Visit: Payer: Medicare Other | Admitting: Family Medicine

## 2020-07-23 MED ORDER — HYDROMORPHONE HCL 4 MG PO TABS: 4.0000 mg | ORAL_TABLET | ORAL | 0 refills | Status: DC | PRN

## 2020-07-23 MED ORDER — MORPHINE SULFATE ER 30 MG PO TBCR: 30.0000 mg | EXTENDED_RELEASE_TABLET | Freq: Three times a day (TID) | ORAL | 0 refills | Status: DC

## 2020-07-26 ENCOUNTER — Telehealth: Payer: Self-pay | Admitting: *Deleted

## 2020-07-26 ENCOUNTER — Other Ambulatory Visit: Payer: Self-pay | Admitting: Oncology

## 2020-07-26 NOTE — Telephone Encounter (Signed)
PC to patient, advised her per Dr. [name] to increase his MS Contin dose to 2 tablets every 8 hours.  Instructed patient to call our office next week to let us know if this is controlling his pain.  If his pain becomes unbearable over the weekend, Dr. [name] suggests that he go to the ED for pain management.  Patient verbalizes understanding.

## 2020-07-26 NOTE — Telephone Encounter (Signed)
-----   Message from [name], MD sent at 07/26/2020 10:44 AM EDT ----- Regarding: RE: Severe pain Please ask him to increase his morphine dose to 2 tablets Every 8 hrs. He is to update [place] about whether this helped next week.  In the interim, if his pain becomes unbearable he can go to the emergency department over the weekend.  Thanks ----- Message ----- From: Rolene Course, RN Sent: 07/26/2020  10:24 AM EDT To: [name], MD Subject: Severe pain                                    This patient called and said he is having severe pain despite taking his MS contin & dilaudid as prescribed.  The pain started last Saturday night & is in his lower back & right hip down to his right knee.  He is taking the MS contin every 8 hours and the dilaudid every 4 hours.  Please advise, thanks.

## 2020-08-07 NOTE — Progress Notes (Signed)
    SUBJECTIVE:   CHIEF COMPLAINT / HPI:   Checkup/constipation: Patient is a 70 year old male presenting today for a checkup. Patient has a history of metastatic adenocarcinoma of the prostate with metastasis to the spine.  He is followed by oncology for this. He has some bouts of constipation which he states has been a real struggle for him.  He typically has a bowel movement every 2 to 5 days using senna and MiraLAX.  Patient states that he is taking a break from chemotherapy and radiation therapy at this time and plans to see his oncologist back in the next few weeks.    PERTINENT  PMH / PSH: History of metastatic prostate cancer  OBJECTIVE:   BP 130/60   Pulse 98   Ht 5\' 8"  (1.727 m)   Wt 198 lb 2 oz (89.9 kg)   SpO2 96%   BMI 30.12 kg/m    General: NAD, pleasant, able to participate in exam HEENT: No pharyngeal erythema, no cervical lymphadenopathy Cardiac: RRR, no murmurs. Respiratory: CTAB, normal effort Abdomen: Bowel sounds present, nontender, nondistended Skin: warm and dry, no rashes noted Neuro: alert, no obvious focal deficits Psych: Normal affect and mood  ASSESSMENT/PLAN:   Checkup: Assessment: 70 year old male with a history of metastatic prostate cancer who continues to follow with oncology.  He is doing pretty well at this time and states his pain is well controlled.  He has not yet had a colonoscopy and we discussed this in detail, including other options.  He does opt for the Cologuard.  I have placed this order.  He is also due for a Tdap prescription and I have placed this order.  I also provided refills as he needed.  I would like to see him back in 1 year.  Constipation: Discussed with patient that I recommend he continue his senna and MiraLAX with a goal to have a bowel movement every 2 to 3 days but it is okay if he goes 4 or 5 days since he is on several narcotics.  I discussed with him that if he finds he is regularly taking longer than 4 to 5 days  between bowel movements I would like to see him back to discuss any other steps we can do to improve his constipation.  [name], [place]    This note was prepared using Dragon voice recognition software and may include unintentional dictation errors due to the inherent limitations of voice recognition software.

## 2020-08-07 NOTE — Patient Instructions (Signed)
It was great to see you! Thank you for allowing me to participate in your care!  I recommend that you always bring your medications to each appointment as this makes it easy to ensure we are on the correct medications and helps [place] not miss when refills are needed.  Our plans for today:  -Everything looks good on physical exam today.  I have sent in the refills as we discussed.  We also discussed a colonoscopy versus Cologuard.  You opted for the Cologuard and so I sent in this order.  They should mail the kit to you within the next week or so.  If you have any concerns or issues please let me know. -I have also sent your Tdap prescription to your pharmacy. -I would like to follow-up with you in 1 year.  Take care and seek immediate care sooner if you develop any concerns.   Dr. [name], [place]

## 2020-08-08 ENCOUNTER — Other Ambulatory Visit: Payer: Self-pay

## 2020-08-08 ENCOUNTER — Encounter: Payer: Self-pay | Admitting: Family Medicine

## 2020-08-08 ENCOUNTER — Ambulatory Visit (INDEPENDENT_AMBULATORY_CARE_PROVIDER_SITE_OTHER): Payer: Medicare Other | Admitting: Family Medicine

## 2020-08-08 VITALS — BP 130/60 | HR 98 | Ht 68.0 in | Wt 198.1 lb

## 2020-08-08 DIAGNOSIS — Z1211 Encounter for screening for malignant neoplasm of colon: Secondary | ICD-10-CM

## 2020-08-08 DIAGNOSIS — Z23 Encounter for immunization: Secondary | ICD-10-CM

## 2020-08-08 MED ORDER — TAMSULOSIN HCL 0.4 MG PO CAPS: 0.4000 mg | ORAL_CAPSULE | Freq: Every day | ORAL | 0 refills | Status: DC | PRN

## 2020-08-08 MED ORDER — TRULANCE 3 MG PO TABS: 3.0000 mg | ORAL_TABLET | Freq: Every day | ORAL | 2 refills | Status: DC

## 2020-08-08 MED ORDER — TETANUS-DIPHTH-ACELL PERTUSSIS 5-2.5-18.5 LF-MCG/0.5 IM SUSP: 0.5000 mL | Freq: Once | INTRAMUSCULAR | 0 refills | Status: AC

## 2020-08-08 NOTE — Progress Notes (Signed)
Patient denies any dysuria or hematuria. Patient states that he has some incontinence.Patient states that he has some leakage. Patient states that he does not have urgency because he cant tell when he needs to void most of the time.He fells that he empties his bladder . Report a moderate stream.States that he has some constipation,but states that he uses stool softeners.Patient states that he does not have to push or strain to start stream. Patient reports a continuous stream. Patient states that he has an appointment in July with is urologist.IPPS 2

## 2020-08-14 ENCOUNTER — Ambulatory Visit
Admission: RE | Admit: 2020-08-14 | Discharge: 2020-08-14 | Disposition: A | Discharge status: Home / Self Care | Payer: Medicare Other | Source: Ambulatory Visit | Attending: Urology | Admitting: Urology

## 2020-08-14 ENCOUNTER — Other Ambulatory Visit: Payer: Self-pay

## 2020-08-14 DIAGNOSIS — C61 Malignant neoplasm of prostate: Secondary | ICD-10-CM

## 2020-08-14 NOTE — Progress Notes (Signed)
Radiation Oncology         [phone] ________________________________  Name: Andre Glass MRN: [id number]  Date: 08/14/2020  DOB: 1950/12/07  Post Treatment Note  CC: Andre Del, DO  Andre Mallow, MD  Diagnosis:   70 year old male with a painful, enlarging pelvic mass secondary to progressive castrate resistant metastatic prostate cancer.  Interval Since Last Radiation:  4.5 weeks   06/27/20 - 07/11/20:  The enlarging left sided pelvic mass was treated to 30Gy in 40fractions of 3 Gy each.  1/15-1/28/21:The spine from T9-L1 was treated to 30 Gy in 10 fractions   Narrative:  I spoke with the patient to conduct his routine scheduled 1 month follow up visit via telephone to spare the patient unnecessary potential exposure in the healthcare setting during the current COVID-19 pandemic.  The patient was notified in advance and gave permission to proceed with this visit format.  He tolerated radiation treatment relatively well with some constipation, nocturia x4-5, hesitancy, straining to void, incomplete emptying and occasional, scant gross hematuria at the start of his stream. He did report modest fatigue.                              On review of systems, the patient states that he is doing well in general.  He has noticed significant improvement in his LUTS and is no longer having to strain to void.  He reports a good flow of stream and feels that he is emptying his bladder well on voiding.  He denies any gross hematuria, dysuria, excessive daytime frequency, urgency or suprapubic discomfort.  He reports improvement in his bowel routine but continues to use stool softeners daily to prevent constipation.  He denies abdominal pain, nausea, vomiting or diarrhea.  He reports a healthy appetite and is maintaining his weight.  He has noticed some gradual improvement in his energy level which he is pleased with.  He continues to tolerate the ADT well and overall, is pleased with his  progress to date.  ALLERGIES:  is allergic to ibuprofen and shrimp [shellfish allergy].  Meds: Current Outpatient Medications  Medication Sig Dispense Refill  . Ferrous Sulfate (IRON PO) Take 1 tablet by mouth daily.    Marland Kitchen HYDROmorphone (DILAUDID) 4 MG tablet Take 1 tablet (4 mg total) by mouth every 4 (four) hours as needed for severe pain. 60 tablet 0  . lidocaine-prilocaine (EMLA) cream Apply 1 application topically as needed. 30 g 0  . morphine (MS CONTIN) 30 MG 12 hr tablet Take 1 tablet (30 mg total) by mouth every 8 (eight) hours. 90 tablet 0  . Multiple Vitamin (MULTIVITAMIN WITH MINERALS) TABS tablet Take 1 tablet by mouth daily.    . NONFORMULARY OR COMPOUNDED ITEM Apply 1-2 g topically 4 (four) times daily. 60 each 2  . ondansetron (ZOFRAN) 4 MG tablet Take 1 tablet (4 mg total) by mouth 3 (three) times daily as needed for nausea or vomiting. 40 tablet 1  . Plecanatide (TRULANCE) 3 MG TABS Take 3 mg by mouth daily. 30 tablet 2  . polyethylene glycol (MIRALAX) 17 g packet Take 17 g by mouth 2 (two) times daily. 60 each 0  . predniSONE (DELTASONE) 5 MG tablet Take 1 tablet (5 mg total) by mouth daily with breakfast. 90 tablet 3  . prochlorperazine (COMPAZINE) 10 MG tablet Take 1 tablet (10 mg total) by mouth every 6 (six) hours as needed for nausea  or vomiting. 30 tablet 0  . senna (SENOKOT) 8.6 MG TABS tablet Take 2 tablets by mouth daily.    . tamsulosin (FLOMAX) 0.4 MG CAPS capsule Take 1 capsule (0.4 mg total) by mouth daily as needed (to help with urination). 30 capsule 0  . abiraterone acetate (ZYTIGA) 250 MG tablet Take 4 tablets (1,000 mg total) by mouth daily. Take on an empty stomach 1 hour before or 2 hours after a meal (Patient not taking: No sig reported) 120 tablet 0   No current facility-administered medications for this encounter.   Facility-Administered Medications Ordered in Other Encounters  Medication Dose Route Frequency Provider Last Rate Last Admin  . heparin  lock flush 100 unit/mL  500 Units Intravenous Once Glass, Andre Dad, MD      . sodium chloride flush (NS) 0.9 % injection 10 mL  10 mL Intravenous PRN Andre Portela, MD        Physical Findings:  vitals were not taken for this visit.   Andre Glass to assess due to telephone follow-up visit format. Lab Findings: Lab Results  Component Value Date   WBC 6.1 07/18/2020   HGB 8.3 (L) 07/18/2020   HCT 27.0 (L) 07/18/2020   MCV 91.8 07/18/2020   PLT 215 07/18/2020     Radiographic Findings: No results found.  Impression/Plan: 55. 70 year old male with a painful, enlarging pelvic mass secondary to progressive castrate resistant metastatic prostate cancer. He appears to have recovered well from the effects of his recent pelvic radiation and is currently without complaints.  He has noticed significant improvement in the pelvic pain and is no longer requiring as much narcotic pain medication which he is very pleased with.  He did have a repeat PSA on 07/18/2020 that was further elevated at 85.6, up from 74.4 on 06/18/2020 but this was drawn only 1 week after completing radiation.  He has a scheduled follow-up visit with Dr. Gloriann Glass at [facility] urology in July 2022 and will follow up with Dr. Alen Glass in June 2022.  We discussed that while we are happy to continue to participate in his care if clinically indicated, at this point, we will plan to see him back on an as-needed basis.  He will continue in routine follow-up under the care and direction of Drs. Glass and Dr. Gloriann Glass for continued management of his systemic disease.  We enjoyed taking care of him and look forward to continuing to follow his progress via correspondence.  He knows that he is welcome to call at anytime with any questions or concerns related to his previous radiation.    Andre Johns, PA-C

## 2020-08-14 NOTE — Progress Notes (Signed)
  Radiation Oncology         [phone]) [phone] ________________________________  Name: Andre Glass MRN: [id number]  Date: 07/11/2020  DOB: Mar 25, 1951  End of Treatment Note  Diagnosis:   70 year old male with a painful, enlarging pelvic mass secondary to progressive castrate resistant metastatic prostate cancer.     Indication for treatment:  Palliation       Radiation treatment dates:   06/27/20 - 07/11/20  Site/dose:   The enlarging left sided pelvic mass was treated to 30 Gy in 10 fractions of 3 Gy each.  Beams/energy:   3D set up with delivery of 15X static beams  Narrative: The patient tolerated radiation treatment relatively well with some constipation, nocturia x4-5, hesitancy, straining to void, incomplete emptying and occasional, scant gross hematuria at the start of his stream. He did report modest fatigue.  Plan: The patient has completed radiation treatment. The patient will return to radiation oncology clinic for routine followup in one month. I advised him to call or return sooner if he has any questions or concerns related to his recovery or treatment. ________________________________  Sheral Apley. Tammi Klippel, M.D.

## 2020-08-22 ENCOUNTER — Other Ambulatory Visit: Payer: Self-pay | Admitting: *Deleted

## 2020-08-22 ENCOUNTER — Telehealth: Payer: Self-pay | Admitting: *Deleted

## 2020-08-22 DIAGNOSIS — Z192 Hormone resistant malignancy status: Secondary | ICD-10-CM

## 2020-08-22 DIAGNOSIS — C61 Malignant neoplasm of prostate: Secondary | ICD-10-CM

## 2020-08-22 MED ORDER — MORPHINE SULFATE ER 30 MG PO TBCR: 30.0000 mg | EXTENDED_RELEASE_TABLET | Freq: Three times a day (TID) | ORAL | 0 refills | Status: DC

## 2020-08-22 NOTE — Telephone Encounter (Signed)
PC to patient's S/O Belenda Cruise, no answer, VM left.  Informed her patient's PET scan has PA so it can be scheduled at any time.  Central Scheduling number given, [phone].  Instructed her to call office with any further questions, [phone]

## 2020-09-03 ENCOUNTER — Other Ambulatory Visit: Payer: Self-pay | Admitting: Family Medicine

## 2020-09-05 LAB — COLOGUARD: Cologuard: POSITIVE — AB

## 2020-09-06 ENCOUNTER — Encounter: Payer: Self-pay | Admitting: *Deleted

## 2020-09-06 ENCOUNTER — Other Ambulatory Visit: Payer: Self-pay | Admitting: Family Medicine

## 2020-09-06 DIAGNOSIS — R195 Other fecal abnormalities: Secondary | ICD-10-CM

## 2020-09-06 NOTE — Progress Notes (Unsigned)
This patient's cologuard came back positive. I placed a copy of the report in your box. Andre Glass

## 2020-09-06 NOTE — Progress Notes (Signed)
Patient's Cologuard returned positive.  I called the patient discussed these results with him.  Patient understands the need for a colonoscopy and is in agreement with proceeding with this.  I have placed a referral for GI for colonoscopy due to the positive Cologuard.  Patient with no further questions at this time but I discussed with him to feel free and reach out to me with any concerns or questions that he may have.

## 2020-09-11 ENCOUNTER — Telehealth: Payer: Self-pay

## 2020-09-11 ENCOUNTER — Other Ambulatory Visit: Payer: Self-pay

## 2020-09-11 ENCOUNTER — Emergency Department (HOSPITAL_BASED_OUTPATIENT_CLINIC_OR_DEPARTMENT_OTHER)
Admission: EM | Admit: 2020-09-11 | Discharge: 2020-09-11 | Disposition: A | Discharge status: Home / Self Care | Payer: Medicare Other | Attending: Emergency Medicine | Admitting: Emergency Medicine

## 2020-09-11 ENCOUNTER — Encounter (HOSPITAL_BASED_OUTPATIENT_CLINIC_OR_DEPARTMENT_OTHER): Payer: Self-pay

## 2020-09-11 ENCOUNTER — Encounter: Payer: Self-pay | Admitting: Oncology

## 2020-09-11 DIAGNOSIS — Z8546 Personal history of malignant neoplasm of prostate: Secondary | ICD-10-CM | POA: Insufficient documentation

## 2020-09-11 DIAGNOSIS — K5900 Constipation, unspecified: Secondary | ICD-10-CM | POA: Diagnosis present

## 2020-09-11 DIAGNOSIS — K5641 Fecal impaction: Secondary | ICD-10-CM | POA: Insufficient documentation

## 2020-09-11 MED ORDER — GLYCERIN (ADULT) 2 G RE SUPP: 1.0000 | RECTAL | 0 refills | Status: DC | PRN

## 2020-09-11 MED ORDER — FLEET ENEMA 7-19 GM/118ML RE ENEM
1.0000 | ENEMA | Freq: Once | RECTAL | Status: AC
  Administered 2020-09-11: 1 via RECTAL
  Filled 2020-09-11: qty 1

## 2020-09-11 NOTE — ED Triage Notes (Signed)
Pt c/o constipation x 5 days-states no results with an enema and OTC meds-NAD-slow gait using cane

## 2020-09-11 NOTE — ED Notes (Signed)
Pt. Is taking Morphine for pain control from Prostate cancer.  Morphine has caused him to have significant chronic constipation.

## 2020-09-11 NOTE — ED Notes (Signed)
Pt had successful BM. Filled up bedside commode with stool. Pt ambulatory to restroom to finish BM.

## 2020-09-11 NOTE — Telephone Encounter (Signed)
Returned call to pt per his request. Pt states he is at the med center [facility] ER with constipation. Pt stated they had given him an enema and were giving him a prescription for suppositories. Will call pt at another time.

## 2020-09-11 NOTE — Discharge Instructions (Signed)
Please read and follow all provided instructions.  Your diagnoses today include:  1. Fecal impaction ([place])     Tests performed today include:  Vital signs. See below for your results today.   Medications prescribed:   Glycerin suppository - medication to help lubricate stool to help pass  Take any prescribed medications only as directed.  Home care instructions:   Follow any educational materials contained in this packet.  Follow-up instructions: Please follow-up with your primary care provider as needed for further evaluation of your symptoms.    Return instructions:  SEEK IMMEDIATE MEDICAL ATTENTION IF:  The pain does not go away or becomes severe   A temperature above 101F develops   Repeated vomiting occurs (multiple episodes)   The pain becomes localized to portions of the abdomen. The right side could possibly be appendicitis. In an adult, the left lower portion of the abdomen could be colitis or diverticulitis.   Blood is being passed in stools or vomit (bright red or black tarry stools)   You develop chest pain, difficulty breathing, dizziness or fainting, or become confused, poorly responsive, or inconsolable (young children)  If you have any other emergent concerns regarding your health  Additional Information: Abdominal (belly) pain can be caused by many things. Your caregiver performed an examination and possibly ordered blood/urine tests and imaging (CT scan, x-rays, ultrasound). Many cases can be observed and treated at home after initial evaluation in the emergency department. Even though you are being discharged home, abdominal pain can be unpredictable. Therefore, you need a repeated exam if your pain does not resolve, returns, or worsens. Most patients with abdominal pain don't have to be admitted to the hospital or have surgery, but serious problems like appendicitis and gallbladder attacks can start out as nonspecific pain. Many abdominal conditions cannot  be diagnosed in one visit, so follow-up evaluations are very important.  Your vital signs today were: BP (!) 139/108   Pulse (!) 59   Temp 98.6 F (37 C) (Oral)   Resp 18   Ht 5\' 8"  (1.727 m)   Wt 91.6 kg   SpO2 100%   BMI 30.71 kg/m  If your blood pressure (bp) was elevated above 135/85 this visit, please have this repeated by your doctor within one month. --------------

## 2020-09-11 NOTE — ED Provider Notes (Signed)
[facility] EMERGENCY DEPARTMENT Provider Note   CSN: [id number] Arrival date & time: 09/11/20  1239     History Chief Complaint  Patient presents with  . Constipation    Andre Glass is a 70 y.o. male.  Patient presents today for evaluation of constipation.  He has a history of prostate cancer, currently in between treatments, on chronic narcotic pain medications.  His current bowel regimen consists of senna, milk of magnesia, MiraLAX.  He has been taking these.  He used an enema 5 days ago and had some production, but no bowel movement since then.  He reports generalized discomfort in the lower abdomen.  No radiation or pain.  No chest pain or shortness of breath.  No vomiting.  The onset of this condition was acute. The course is constant. Aggravating factors: none. Alleviating factors: none.          Past Medical History:  Diagnosis Date  . Anemia   . History of kidney stones   . Prostate cancer ([place])   . UTI (urinary tract infection)     Patient Active Problem List   Diagnosis Date Noted  . Prostate cancer ([place])   . Intractable pain 01/18/2020  . BPH (benign prostatic hyperplasia) 11/08/2019  . Catheter-associated urinary tract infection ([place]) 09/14/2019  . Drug induced constipation 07/18/2019  . Goals of care, counseling/discussion 06/26/2019  . 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 Metastatic castration-resistant adenocarcinoma of prostate ([place]) 04/19/2019    Past Surgical History:  Procedure Laterality Date  . IR IMAGING GUIDED PORT INSERTION  01/31/2020  . TRANSURETHRAL RESECTION OF PROSTATE N/A 11/08/2019   Procedure: TRANSURETHRAL RESECTION OF THE PROSTATE (TURP);  Surgeon: [name], MD;  Location: [facility] ORS;  Service: Urology;  Laterality: N/A;       Family History  Problem Relation Age of Onset  . Stomach cancer Mother   . Pancreatic cancer Sister   . Breast cancer Neg Hx   . Prostate cancer Neg Hx   . Colon cancer Neg Hx      Social History   Tobacco Use  . Smoking status: Never Smoker  . Smokeless tobacco: Never Used  Vaping Use  . Vaping Use: Never used  Substance Use Topics  . Alcohol use: Never  . Drug use: Never    Home Medications Prior to Admission medications   Medication Sig Start Date End Date Taking? Authorizing Provider  abiraterone acetate (ZYTIGA) 250 MG tablet Take 4 tablets (1,000 mg total) by mouth daily. Take on an empty stomach 1 hour before or 2 hours after a meal Patient not taking: No sig reported 12/25/19   [name], MD  Ferrous Sulfate (IRON PO) Take 1 tablet by mouth daily.    [provider]  HYDROmorphone (DILAUDID) 4 MG tablet Take 1 tablet (4 mg total) by mouth every 4 (four) hours as needed for severe pain. 07/23/20   [name], MD  lidocaine-prilocaine (EMLA) cream Apply 1 application topically as needed. 01/15/20   [name], MD  morphine (MS CONTIN) 30 MG 12 hr tablet Take 1 tablet (30 mg total) by mouth every 8 (eight) hours. 08/22/20   [name], MD  Multiple Vitamin (MULTIVITAMIN WITH MINERALS) TABS tablet Take 1 tablet by mouth daily.    [provider]  NONFORMULARY OR COMPOUNDED ITEM Apply 1-2 g topically 4 (four) times daily. 04/24/20   Marzetta Board, DPM  ondansetron (ZOFRAN) 4 MG tablet Take 1  tablet (4 mg total) by mouth 3 (three) times daily as needed for nausea or vomiting. 07/22/20   [name], MD  Plecanatide (TRULANCE) 3 MG TABS Take 3 mg by mouth daily. 08/08/20   [name], DO  polyethylene glycol (MIRALAX) 17 g packet Take 17 g by mouth 2 (two) times daily. 01/19/20   Hongalgi, Lenis Dickinson, MD  predniSONE (DELTASONE) 5 MG tablet Take 1 tablet (5 mg total) by mouth daily with breakfast. 10/25/19   [name], MD  prochlorperazine (COMPAZINE) 10 MG tablet Take 1 tablet (10 mg total) by mouth every 6 (six) hours as needed for nausea or vomiting. 01/15/20   [name], MD  senna (SENOKOT) 8.6 MG TABS  tablet Take 2 tablets by mouth daily.    [provider]  tamsulosin (FLOMAX) 0.4 MG CAPS capsule TAKE 1 CAPSULE (0.4 MG TOTAL) BY MOUTH DAILY AS NEEDED (TO HELP WITH URINATION). 09/03/20   [name], DO    Allergies    Ibuprofen and Shrimp [shellfish allergy]  Review of Systems   Review of Systems  Constitutional: Negative for fever.  HENT: Negative for rhinorrhea and sore throat.   Eyes: Negative for redness.  Respiratory: Negative for cough.   Cardiovascular: Negative for chest pain.  Gastrointestinal: Positive for abdominal pain (discomfort) and constipation. Negative for diarrhea, nausea and vomiting.  Genitourinary: Negative for dysuria and hematuria.  Musculoskeletal: Negative for myalgias.  Skin: Negative for rash.  Neurological: Negative for headaches.    Physical Exam Updated Vital Signs BP (!) 188/88 (BP Location: Right Arm)   Pulse 60   Temp 98.6 F (37 C) (Oral)   Resp 18   Ht 5\' 8"  (1.727 m)   Wt 91.6 kg   SpO2 100%   BMI 30.71 kg/m   Physical Exam Vitals and nursing note reviewed.  Constitutional:      Appearance: He is well-developed.  HENT:     Head: Normocephalic and atraumatic.  Eyes:     General:        Right eye: No discharge.        Left eye: No discharge.     Conjunctiva/sclera: Conjunctivae normal.  Cardiovascular:     Rate and Rhythm: Normal rate and regular rhythm.     Heart sounds: Normal heart sounds.  Pulmonary:     Effort: Pulmonary effort is normal.     Breath sounds: Normal breath sounds.  Abdominal:     Palpations: Abdomen is soft.     Tenderness: There is no abdominal tenderness.  Genitourinary:    Comments: Patient with fecal impaction Musculoskeletal:     Cervical back: Normal range of motion and neck supple.  Skin:    General: Skin is warm and dry.  Neurological:     Mental Status: He is alert.     ED Results / Procedures / Treatments   Labs (all labs ordered are listed, but only abnormal results are  displayed) Labs Reviewed - No data to display  EKG None  Radiology No results found.  Procedures Procedures   Medications Ordered in ED Medications  sodium phosphate (FLEET) 7-19 GM/118ML enema 1 enema (1 enema Rectal Given 09/11/20 1538)    ED Course  I have reviewed the triage vital signs and the nursing notes.  Pertinent labs & imaging results that were available during my care of the patient were reviewed by me and considered in my medical decision making (see chart for details).  Patient seen and examined.  Nurse tech chaperone for rectal exam.  Patient was impacted.  He has a large amount of hard stool in the rectum.  I was unable to remove any of the stool, but did try to manipulate some of it around the pelvic posteriorly.  Will give enema to see if he can pass some of the stool.  He is on maximal oral therapy for constipation it seems.  Abdomen is otherwise soft and non-tender. I do not feel that imaging or lab work is indicated at this point.  He states that he is due for a PET scan upcoming.  Vital signs reviewed and are as follows: BP (!) 188/88 (BP Location: Right Arm)   Pulse 60   Temp 98.6 F (37 C) (Oral)   Resp 18   Ht 5\' 8"  (1.727 m)   Wt 91.6 kg   SpO2 100%   BMI 30.71 kg/m   4:34 PM patient with good results from Fleet enema.  Abdomen remains soft and nontender.  Patient is comfortable with discharged home at this time.  He will continue his constipation regimen.  We will give glycerin suppositories to use for lubrication if needed in the future.  The patient was urged to return to the Emergency Department immediately with worsening of current symptoms, worsening abdominal pain, persistent vomiting, blood noted in stools, fever, or any other concerns. The patient verbalized understanding.     MDM Rules/Calculators/A&P                          Patient with fecal impaction.  Abdomen soft and nontender.  Resolved with DRE and Fleet enema.  Patient feeling  better.  Plan for discharge home with plan as above.    Final Clinical Impression(s) / ED Diagnoses Final diagnoses:  Fecal impaction [facility])    Rx / DC Orders ED Discharge Orders         Ordered    glycerin adult 2 g suppository  As needed        09/11/20 1632           Carlisle Cater, PA-C 09/11/20 [place], [place], DO 09/11/20 2000

## 2020-09-12 ENCOUNTER — Telehealth: Payer: Self-pay | Admitting: *Deleted

## 2020-09-12 NOTE — Telephone Encounter (Signed)
Transition Care Management Follow-up Telephone Call Date of discharge and from where: 09/11/2020 - Why How have you been since you were released from the hospital? "Much better after I had a bowel movement" Any questions or concerns? No  Items Reviewed: Did the pt receive and understand the discharge instructions provided? Yes  Medications obtained and verified? Yes  Other? No  Any new allergies since your discharge? No  Dietary orders reviewed? No Do you have support at home? Yes     Functional Questionnaire: (I = Independent and D = Dependent) ADLs: I  Bathing/Dressing- I  Meal Prep- I  Eating- I  Maintaining continence- I  Transferring/Ambulation- I  Managing Meds- I  Follow up appointments reviewed:  PCP Hospital f/u appt confirmed? No   Specialist Hospital f/u appt confirmed? No   Are transportation arrangements needed? No  If their condition worsens, is the pt aware to call PCP or go to the Emergency Dept.? Yes Was the patient provided with contact information for the PCP's office or ED? Yes Was to pt encouraged to call back with questions or concerns? Yes

## 2020-09-17 ENCOUNTER — Other Ambulatory Visit: Payer: Self-pay

## 2020-09-17 ENCOUNTER — Inpatient Hospital Stay: Payer: Medicare Other | Attending: Oncology

## 2020-09-17 DIAGNOSIS — D630 Anemia in neoplastic disease: Secondary | ICD-10-CM | POA: Diagnosis not present

## 2020-09-17 DIAGNOSIS — C61 Malignant neoplasm of prostate: Secondary | ICD-10-CM | POA: Diagnosis not present

## 2020-09-17 DIAGNOSIS — Z923 Personal history of irradiation: Secondary | ICD-10-CM | POA: Diagnosis not present

## 2020-09-17 DIAGNOSIS — Z95828 Presence of other vascular implants and grafts: Secondary | ICD-10-CM

## 2020-09-17 DIAGNOSIS — Z79899 Other long term (current) drug therapy: Secondary | ICD-10-CM | POA: Diagnosis not present

## 2020-09-17 DIAGNOSIS — C7951 Secondary malignant neoplasm of bone: Secondary | ICD-10-CM | POA: Insufficient documentation

## 2020-09-17 DIAGNOSIS — C775 Secondary and unspecified malignant neoplasm of intrapelvic lymph nodes: Secondary | ICD-10-CM | POA: Diagnosis present

## 2020-09-17 DIAGNOSIS — Z7952 Long term (current) use of systemic steroids: Secondary | ICD-10-CM | POA: Insufficient documentation

## 2020-09-17 LAB — CMP (CANCER CENTER ONLY)
ALT: 7 U/L (ref 0–44)
AST: 15 U/L (ref 15–41)
Albumin: 2.6 g/dL — ABNORMAL LOW (ref 3.5–5.0)
Alkaline Phosphatase: 93 U/L (ref 38–126)
Anion gap: 10 (ref 5–15)
BUN: 24 mg/dL — ABNORMAL HIGH (ref 8–23)
CO2: 26 mmol/L (ref 22–32)
Calcium: 9 mg/dL (ref 8.9–10.3)
Chloride: 106 mmol/L (ref 98–111)
Creatinine: 1.44 mg/dL — ABNORMAL HIGH (ref 0.61–1.24)
GFR, Estimated: 52 mL/min — ABNORMAL LOW (ref >60)
Glucose, Bld: 122 mg/dL — ABNORMAL HIGH (ref 70–99)
Potassium: 4.1 mmol/L (ref 3.5–5.1)
Sodium: 142 mmol/L (ref 135–145)
Total Bilirubin: 0.2 mg/dL — ABNORMAL LOW (ref 0.3–1.2)
Total Protein: 7.5 g/dL (ref 6.5–8.1)

## 2020-09-17 LAB — CBC WITH DIFFERENTIAL (CANCER CENTER ONLY)
Abs Immature Granulocytes: 0.04 10*3/uL (ref 0.00–0.07)
Basophils Absolute: 0 10*3/uL (ref 0.0–0.1)
Basophils Relative: 0 %
Eosinophils Absolute: 0.1 10*3/uL (ref 0.0–0.5)
Eosinophils Relative: 2 %
HCT: 26.4 % — ABNORMAL LOW (ref 39.0–52.0)
Hemoglobin: 8.2 g/dL — ABNORMAL LOW (ref 13.0–17.0)
Immature Granulocytes: 1 %
Lymphocytes Relative: 17 %
Lymphs Abs: 1.2 10*3/uL (ref 0.7–4.0)
MCH: 28.6 pg (ref 26.0–34.0)
MCHC: 31.1 g/dL (ref 30.0–36.0)
MCV: 92 fL (ref 80.0–100.0)
Monocytes Absolute: 0.6 10*3/uL (ref 0.1–1.0)
Monocytes Relative: 9 %
Neutro Abs: 5.3 10*3/uL (ref 1.7–7.7)
Neutrophils Relative %: 71 %
Platelet Count: 306 10*3/uL (ref 150–400)
RBC: 2.87 MIL/uL — ABNORMAL LOW (ref 4.22–5.81)
RDW: 15.8 % — ABNORMAL HIGH (ref 11.5–15.5)
WBC Count: 7.4 10*3/uL (ref 4.0–10.5)
nRBC: 0 % (ref 0.0–0.2)

## 2020-09-17 MED ORDER — SODIUM CHLORIDE 0.9% FLUSH
10.0000 mL | INTRAVENOUS | Status: AC | PRN
  Administered 2020-09-17: 10 mL
  Filled 2020-09-17: qty 10

## 2020-09-17 MED ORDER — HEPARIN SOD (PORK) LOCK FLUSH 100 UNIT/ML IV SOLN
500.0000 [IU] | INTRAVENOUS | Status: AC | PRN
  Administered 2020-09-17: 500 [IU]
  Filled 2020-09-17: qty 5

## 2020-09-18 ENCOUNTER — Ambulatory Visit (HOSPITAL_COMMUNITY)
Admission: RE | Admit: 2020-09-18 | Discharge: 2020-09-18 | Disposition: A | Discharge status: Home / Self Care | Payer: Medicare Other | Source: Ambulatory Visit | Attending: Oncology | Admitting: Oncology

## 2020-09-18 ENCOUNTER — Ambulatory Visit (HOSPITAL_COMMUNITY): Payer: Medicare Other

## 2020-09-18 DIAGNOSIS — C61 Malignant neoplasm of prostate: Secondary | ICD-10-CM | POA: Diagnosis not present

## 2020-09-18 LAB — PROSTATE-SPECIFIC AG, SERUM (LABCORP): Prostate Specific Ag, Serum: 91.5 ng/mL — ABNORMAL HIGH (ref 0.0–4.0)

## 2020-09-18 MED ORDER — PIFLIFOLASTAT F 18 (PYLARIFY) INJECTION
9.0000 | Freq: Once | INTRAVENOUS | Status: AC
  Administered 2020-09-18: 9.58 via INTRAVENOUS

## 2020-09-24 ENCOUNTER — Other Ambulatory Visit: Payer: Self-pay

## 2020-09-24 ENCOUNTER — Inpatient Hospital Stay (HOSPITAL_BASED_OUTPATIENT_CLINIC_OR_DEPARTMENT_OTHER): Payer: Medicare Other | Admitting: Oncology

## 2020-09-24 VITALS — BP 153/72 | HR 68 | Temp 96.7°F | Resp 10 | Ht 68.0 in | Wt 210.0 lb

## 2020-09-24 DIAGNOSIS — C61 Malignant neoplasm of prostate: Secondary | ICD-10-CM | POA: Diagnosis not present

## 2020-09-24 MED ORDER — PROCHLORPERAZINE MALEATE 10 MG PO TABS: 10.0000 mg | ORAL_TABLET | Freq: Four times a day (QID) | ORAL | 0 refills | Status: AC | PRN

## 2020-09-24 MED ORDER — ONDANSETRON HCL 4 MG PO TABS: 4.0000 mg | ORAL_TABLET | Freq: Three times a day (TID) | ORAL | 1 refills | Status: DC | PRN

## 2020-09-24 NOTE — Progress Notes (Signed)
Hematology and Oncology Follow Up Visit  Andre Glass [id number] 01-07-1951 70 y.o. 09/24/2020 10:32 AM Andre Glass, DOWelborn, [name], DO   Principle Diagnosis: 70 year old man with advanced prostate cancer with lymphadenopathy and disease to the bone diagnosed in December 2020.  He presented with Gleason score 4+5 = 9 and a PSA of 366 and currently has castration hyper resistant disease.   Prior Therapy: He is status post prostate biopsy completed on December 3 of 2020.  Pathology showed a Gleason score 4+5 = 9 with 12 out of 12 cores.  He is status post both of radiation therapy to the spine between T9 and L1.  He received 30 Gray in 10 fractions between January 15 and 28, 2021.   Firmagon 240 mg started on May 31, 2019.  Zytiga 1000 mg daily with prednisone 5 mg daily started in May 2021. Therapy discontinued in October of 2021 due to progression of disease.  Taxotere chemotherapy with 75 mg per metered squared every 3 weeks started in October 2021.  The dose was reduced to 60 mg per metered square after 3 cycles.   He completed 5 cycles of therapy.  He is status post radiation therapy to enlarging pelvic prostate cancer mass.  He received 30 Gray in 10 fractions completed April 2022.   Current therapy:   Eligard every 4 months.  He will receive Eligard today and repeated in 4 months.    Interim History: Andre Glass is here for return evaluation.  Since the last visit, he was seen in the emergency department on June 8 for constipation.  Since that time, he continues to have issues with constipation but overall managing it well.  He denies any nausea, vomiting or shortness of breath.  He is eating better at this time.  He has experienced more pain in his pelvic area as well as right hip.  He is also reported more progression of his fatigue and unsteadiness.  He denies any falls or syncope.   Medications: Updated on review. Current Outpatient Medications  Medication Sig Dispense  Refill         Ferrous Sulfate (IRON PO) Take 1 tablet by mouth daily.     HYDROmorphone (DILAUDID) 4 MG tablet Take 1 tablet (4 mg total) by mouth every 4 (four) hours as needed for severe pain. 60 tablet 0   lidocaine-prilocaine (EMLA) cream Apply 1 application topically as needed. 30 g 0   morphine (MS CONTIN) 30 MG 12 hr tablet Take 1 tablet (30 mg total) by mouth every 8 (eight) hours. 90 tablet 0   Multiple Vitamin (MULTIVITAMIN WITH MINERALS) TABS tablet Take 1 tablet by mouth daily.     ondansetron (ZOFRAN) 4 MG tablet TAKE 1 TABLET (4 MG TOTAL) BY MOUTH 3 (THREE) TIMES DAILY. (Patient taking differently: Take 4 mg by mouth 3 (three) times daily as needed for nausea or vomiting. ) 20 tablet 0   oxybutynin (DITROPAN) 5 MG tablet Take 5 mg by mouth 3 (three) times daily as needed for bladder spasms.     Plecanatide (TRULANCE) 3 MG TABS Take 3 mg by mouth daily. 30 tablet 2   polyethylene glycol (MIRALAX) 17 g packet Take 17 g by mouth 2 (two) times daily. 60 each 0   predniSONE (DELTASONE) 5 MG tablet Take 1 tablet (5 mg total) by mouth daily with breakfast. 90 tablet 3   prochlorperazine (COMPAZINE) 10 MG tablet Take 1 tablet (10 mg total) by mouth every 6 (six) hours as needed  for nausea or vomiting. 30 tablet 0   senna (SENOKOT) 8.6 MG TABS tablet Take 2 tablets by mouth daily.      tamsulosin (FLOMAX) 0.4 MG CAPS capsule Take 1 capsule (0.4 mg total) by mouth daily after supper. (Patient taking differently: Take 0.4 mg by mouth daily as needed (to help with urination). ) 30 capsule 3   No current facility-administered medications for this visit.     Allergies:  Allergies  Allergen Reactions   Ibuprofen Anaphylaxis   Shrimp [Shellfish Allergy] Anaphylaxis       Physical Exam:      Blood pressure (!) 153/72, pulse 68, temperature (!) 96.7 F (35.9 C), resp. rate 10, height 5\' 8"  (1.727 m), weight 210 lb (95.3 kg), SpO2 99 %.       ECOG: 2   General appearance:  Comfortable appearing without any discomfort Head: Normocephalic without any trauma Oropharynx: Mucous membranes are moist and pink without any thrush or ulcers. Eyes: Pupils are equal and round reactive to light. Lymph nodes: No cervical, supraclavicular, inguinal or axillary lymphadenopathy.   Heart:regular rate and rhythm.  S1 and S2 without leg edema. Lung: Clear without any rhonchi or wheezes.  No dullness to percussion. Abdomin: Soft, nontender, nondistended with good bowel sounds.  No hepatosplenomegaly. Musculoskeletal: No joint deformity or effusion.  Full range of motion noted. Neurological: No deficits noted on motor, sensory and deep tendon reflex exam. Skin: No petechial rash or dryness.  Appeared moist.                       Lab Results: Lab Results  Component Value Date   WBC 7.4 09/17/2020   HGB 8.2 (L) 09/17/2020   HCT 26.4 (L) 09/17/2020   MCV 92.0 09/17/2020   PLT 306 09/17/2020     Chemistry      Component Value Date/Time   NA 142 09/17/2020 1050   K 4.1 09/17/2020 1050   CL 106 09/17/2020 1050   CO2 26 09/17/2020 1050   BUN 24 (H) 09/17/2020 1050   CREATININE 1.44 (H) 09/17/2020 1050      Component Value Date/Time   CALCIUM 9.0 09/17/2020 1050   ALKPHOS 93 09/17/2020 1050   AST 15 09/17/2020 1050   ALT 7 09/17/2020 1050   BILITOT 0.2 (L) 09/17/2020 1050         Results for Andre Glass (MRN [id number]) as of 09/24/2020 10:34  Ref. Range 06/18/2020 12:51 07/18/2020 11:43 09/17/2020 10:50  Prostate Specific Ag, Serum Latest Ref Range: 0.0 - 4.0 ng/mL 74.4 (H) 85.6 (H) 91.5 (H)    IMPRESSION: 1. Locally advanced prostate primary with bladder extension. Given concurrent physiologic urinary tracer affinity, the primary is somewhat challenging to evaluate. Felt to be slightly enlarged compared to 06/14/2020. Persistent secondary right and new left sided hydroureteronephrosis. 2. Moderate abdominal and marked pelvic nodal  metastasis, progressive since 06/14/2020. 3. Isolated right supraclavicular/low cervical tracer avid nodal metastasis. 4. Widespread osseous metastasis. 5. No change in 6 mm right upper lobe pulmonary nodule, below PET resolution.          Impression and Plan:   69 year old man with:   1.    Advanced prostate cancer with lymphadenopathy that is currently castration-resistant after initially presented in 2020.   Treatment options moving forward were discussed at this time.  His PET scan obtained on September 18, 2020 was personally reviewed and discussed today and showed progression of disease.  Jevtana salvage chemotherapy remains  the only option approved at this time.  Lutetium 117 could also be an option once its readily available.  Risks and benefits of starting Jevtana chemotherapy versus continued active surveillance were discussed.  Complications include nausea, vomiting, mild suppression, neutropenia among others were reviewed.   After discussion he is agreeable to proceed.   2.  Androgen deprivation therapy: Eligard given in April 2022 and will be repeated in August.  Complication occluding weight gain, hot flashes among others were discussed.   3.  Bone directed therapy: I recommended continued calcium and vitamin D supplements.  Delton See has been deferred till he obtains dental clearance.   4.  Bone pain: Manageable at this time with morphine and Dilaudid.  His pain is related to metastatic cancer to the bone.   5.  IV access: Port-A-Cath continues to be in use without any issues.  6.  Hydronephrosis: Kidney function remained stable overall and will continue to monitor.  His hydronephrosis is related to his malignancy.  7.  Anemia: Related to malignancy and chemotherapy infusion.  Hemoglobin is adequate.  He does not require any additional transfusion.  8.  Follow-up: will be in in the near future to start salvage chemotherapy.     30  minutes were dedicated to this  encounter.  The time was spent on reviewing laboratory data, disease status update, reviewing imaging studies and treatment choices for the future.   Zola Button, MD 6/21/202210:32 AM

## 2020-09-24 NOTE — Progress Notes (Signed)
DISCONTINUE ON PATHWAY REGIMEN - Prostate     A cycle is every 21 days:     Docetaxel      Prednisone   **Always confirm dose/schedule in your pharmacy ordering system**  REASON: Disease Progression PRIOR TREATMENT: POS37: Docetaxel 75 mg/m2 q21 Days + Prednisone 5 mg BID Until Progression or Toxicity TREATMENT RESPONSE: Partial Response (PR)  START ON PATHWAY REGIMEN - Prostate     A cycle is every 21 days.:     Cabazitaxel      Prednisone   **Always confirm dose/schedule in your pharmacy ordering system**  Patient Characteristics: Adenocarcinoma, Recurrent/New Systemic Disease, Castration Resistant, M1, Prior Novel Hormonal Agent, No Molecular Alteration or Targeted Therapy Exhausted, Prior Docetaxel/Docetaxel Not Indicated Histology: Adenocarcinoma Therapeutic Status: Recurrent/New Systemic Disease  Intent of Therapy: Non-Curative / Palliative Intent, Discussed with Patient

## 2020-09-27 ENCOUNTER — Other Ambulatory Visit: Payer: Self-pay | Admitting: Oncology

## 2020-09-27 ENCOUNTER — Telehealth: Payer: Self-pay | Admitting: *Deleted

## 2020-09-27 DIAGNOSIS — Z192 Hormone resistant malignancy status: Secondary | ICD-10-CM

## 2020-09-27 DIAGNOSIS — C61 Malignant neoplasm of prostate: Secondary | ICD-10-CM

## 2020-09-27 MED ORDER — MORPHINE SULFATE ER 60 MG PO TBCR: 60.0000 mg | EXTENDED_RELEASE_TABLET | Freq: Three times a day (TID) | ORAL | 0 refills | Status: DC

## 2020-09-27 MED ORDER — HYDROMORPHONE HCL 4 MG PO TABS: 4.0000 mg | ORAL_TABLET | ORAL | 0 refills | Status: DC | PRN

## 2020-09-27 NOTE — Telephone Encounter (Signed)
-----   Message from [name], MD sent at 09/27/2020  4:06 PM EDT ----- Regarding: RE: Pain I want him to increase his morphine to 60 mg every 8 hours.  New prescription was sent to his pharmacy.  He will continue to use Dilaudid as needed in between the morphine.  New prescription for Dilaudid was sent as well.  Thanks ----- Message ----- From: Rolene Course, RN Sent: 09/27/2020   3:47 PM EDT To: [name], MD Subject: Pain                                           Andre Glass called & said he is taking his pain medications as directed & still having a lot of pain.  He is alternating his dilaudid & morphine but only has about an hour with no pain.  He said he has enough meds to make it through the first part of next week.  Do you have any suggestions?  Thanks, Bethena Roys

## 2020-09-27 NOTE — Telephone Encounter (Signed)
PC to patient regarding his pain management, relayed message from Dr. [name]: I want him to increase his morphine to 60 mg every 8 hours.  New prescription was sent to his pharmacy.  He will continue to use Dilaudid as needed inbetween the morphine.  New prescription for Dilaudid was sent as well. Patient verbalizes understanding.

## 2020-09-28 ENCOUNTER — Encounter (HOSPITAL_COMMUNITY): Payer: Self-pay | Admitting: Emergency Medicine

## 2020-09-28 ENCOUNTER — Emergency Department (HOSPITAL_COMMUNITY): Payer: Medicare Other

## 2020-09-28 ENCOUNTER — Inpatient Hospital Stay (HOSPITAL_COMMUNITY)
Admission: EM | Admit: 2020-09-28 | Discharge: 2020-10-05 | DRG: 723 | Disposition: A | Discharge status: Home Health | Payer: Medicare Other | Attending: Internal Medicine | Admitting: Internal Medicine

## 2020-09-28 ENCOUNTER — Other Ambulatory Visit: Payer: Self-pay

## 2020-09-28 DIAGNOSIS — Z91013 Allergy to seafood: Secondary | ICD-10-CM | POA: Diagnosis not present

## 2020-09-28 DIAGNOSIS — N1831 Chronic kidney disease, stage 3a: Secondary | ICD-10-CM | POA: Diagnosis present

## 2020-09-28 DIAGNOSIS — W19XXXA Unspecified fall, initial encounter: Secondary | ICD-10-CM | POA: Diagnosis present

## 2020-09-28 DIAGNOSIS — N1330 Unspecified hydronephrosis: Secondary | ICD-10-CM | POA: Diagnosis present

## 2020-09-28 DIAGNOSIS — I129 Hypertensive chronic kidney disease with stage 1 through stage 4 chronic kidney disease, or unspecified chronic kidney disease: Secondary | ICD-10-CM | POA: Diagnosis present

## 2020-09-28 DIAGNOSIS — Z800 Family history of malignant neoplasm of digestive organs: Secondary | ICD-10-CM

## 2020-09-28 DIAGNOSIS — Z923 Personal history of irradiation: Secondary | ICD-10-CM

## 2020-09-28 DIAGNOSIS — N179 Acute kidney failure, unspecified: Secondary | ICD-10-CM | POA: Diagnosis present

## 2020-09-28 DIAGNOSIS — Z8546 Personal history of malignant neoplasm of prostate: Secondary | ICD-10-CM

## 2020-09-28 DIAGNOSIS — E876 Hypokalemia: Secondary | ICD-10-CM | POA: Diagnosis not present

## 2020-09-28 DIAGNOSIS — I10 Essential (primary) hypertension: Secondary | ICD-10-CM

## 2020-09-28 DIAGNOSIS — D631 Anemia in chronic kidney disease: Secondary | ICD-10-CM | POA: Diagnosis present

## 2020-09-28 DIAGNOSIS — T451X5A Adverse effect of antineoplastic and immunosuppressive drugs, initial encounter: Secondary | ICD-10-CM | POA: Diagnosis present

## 2020-09-28 DIAGNOSIS — Z87440 Personal history of urinary (tract) infections: Secondary | ICD-10-CM | POA: Diagnosis not present

## 2020-09-28 DIAGNOSIS — Z20822 Contact with and (suspected) exposure to covid-19: Secondary | ICD-10-CM | POA: Diagnosis present

## 2020-09-28 DIAGNOSIS — D630 Anemia in neoplastic disease: Secondary | ICD-10-CM | POA: Diagnosis present

## 2020-09-28 DIAGNOSIS — C61 Malignant neoplasm of prostate: Principal | ICD-10-CM | POA: Diagnosis present

## 2020-09-28 DIAGNOSIS — R64 Cachexia: Secondary | ICD-10-CM | POA: Diagnosis present

## 2020-09-28 DIAGNOSIS — K5903 Drug induced constipation: Secondary | ICD-10-CM | POA: Diagnosis present

## 2020-09-28 DIAGNOSIS — G893 Neoplasm related pain (acute) (chronic): Secondary | ICD-10-CM | POA: Diagnosis present

## 2020-09-28 DIAGNOSIS — T402X5A Adverse effect of other opioids, initial encounter: Secondary | ICD-10-CM | POA: Diagnosis present

## 2020-09-28 DIAGNOSIS — N139 Obstructive and reflux uropathy, unspecified: Secondary | ICD-10-CM | POA: Diagnosis not present

## 2020-09-28 DIAGNOSIS — K5900 Constipation, unspecified: Secondary | ICD-10-CM | POA: Diagnosis present

## 2020-09-28 DIAGNOSIS — N1339 Other hydronephrosis: Secondary | ICD-10-CM | POA: Diagnosis not present

## 2020-09-28 DIAGNOSIS — I160 Hypertensive urgency: Secondary | ICD-10-CM | POA: Diagnosis present

## 2020-09-28 DIAGNOSIS — Z79899 Other long term (current) drug therapy: Secondary | ICD-10-CM | POA: Diagnosis not present

## 2020-09-28 DIAGNOSIS — R52 Pain, unspecified: Secondary | ICD-10-CM | POA: Diagnosis present

## 2020-09-28 DIAGNOSIS — Z79891 Long term (current) use of opiate analgesic: Secondary | ICD-10-CM

## 2020-09-28 DIAGNOSIS — N131 Hydronephrosis with ureteral stricture, not elsewhere classified: Secondary | ICD-10-CM | POA: Diagnosis present

## 2020-09-28 DIAGNOSIS — Z9079 Acquired absence of other genital organ(s): Secondary | ICD-10-CM | POA: Diagnosis not present

## 2020-09-28 DIAGNOSIS — E669 Obesity, unspecified: Secondary | ICD-10-CM | POA: Diagnosis present

## 2020-09-28 DIAGNOSIS — Z87442 Personal history of urinary calculi: Secondary | ICD-10-CM | POA: Diagnosis not present

## 2020-09-28 DIAGNOSIS — M7989 Other specified soft tissue disorders: Secondary | ICD-10-CM | POA: Diagnosis present

## 2020-09-28 DIAGNOSIS — Z886 Allergy status to analgesic agent status: Secondary | ICD-10-CM

## 2020-09-28 DIAGNOSIS — Z6832 Body mass index (BMI) 32.0-32.9, adult: Secondary | ICD-10-CM | POA: Diagnosis not present

## 2020-09-28 LAB — CBC WITH DIFFERENTIAL/PLATELET
Abs Immature Granulocytes: 0.03 10*3/uL (ref 0.00–0.07)
Basophils Absolute: 0 10*3/uL (ref 0.0–0.1)
Basophils Relative: 0 %
Eosinophils Absolute: 0.1 10*3/uL (ref 0.0–0.5)
Eosinophils Relative: 1 %
HCT: 26.1 % — ABNORMAL LOW (ref 39.0–52.0)
Hemoglobin: 7.9 g/dL — ABNORMAL LOW (ref 13.0–17.0)
Immature Granulocytes: 1 %
Lymphocytes Relative: 15 %
Lymphs Abs: 0.9 10*3/uL (ref 0.7–4.0)
MCH: 27.9 pg (ref 26.0–34.0)
MCHC: 30.3 g/dL (ref 30.0–36.0)
MCV: 92.2 fL (ref 80.0–100.0)
Monocytes Absolute: 0.4 10*3/uL (ref 0.1–1.0)
Monocytes Relative: 7 %
Neutro Abs: 4.8 10*3/uL (ref 1.7–7.7)
Neutrophils Relative %: 76 %
Platelets: 286 10*3/uL (ref 150–400)
RBC: 2.83 MIL/uL — ABNORMAL LOW (ref 4.22–5.81)
RDW: 14.9 % (ref 11.5–15.5)
WBC: 6.3 10*3/uL (ref 4.0–10.5)
nRBC: 0 % (ref 0.0–0.2)

## 2020-09-28 LAB — BASIC METABOLIC PANEL
Anion gap: 10 (ref 5–15)
BUN: 78 mg/dL — ABNORMAL HIGH (ref 8–23)
CO2: 27 mmol/L (ref 22–32)
Calcium: 9.1 mg/dL (ref 8.9–10.3)
Chloride: 100 mmol/L (ref 98–111)
Creatinine, Ser: 4.4 mg/dL — ABNORMAL HIGH (ref 0.61–1.24)
GFR, Estimated: 14 mL/min — ABNORMAL LOW (ref >60)
Glucose, Bld: 117 mg/dL — ABNORMAL HIGH (ref 70–99)
Potassium: 4.5 mmol/L (ref 3.5–5.1)
Sodium: 137 mmol/L (ref 135–145)

## 2020-09-28 LAB — RESP PANEL BY RT-PCR (FLU A&B, COVID) ARPGX2
Influenza A by PCR: NEGATIVE
Influenza B by PCR: NEGATIVE
SARS Coronavirus 2 by RT PCR: NEGATIVE

## 2020-09-28 LAB — POC OCCULT BLOOD, ED: Fecal Occult Bld: POSITIVE — AB

## 2020-09-28 MED ORDER — LABETALOL HCL 5 MG/ML IV SOLN
5.0000 mg | INTRAVENOUS | Status: DC | PRN
  Administered 2020-09-28 – 2020-09-29 (×2): 5 mg via INTRAVENOUS
  Filled 2020-09-28 (×4): qty 4

## 2020-09-28 MED ORDER — ONDANSETRON HCL 4 MG/2ML IJ SOLN
4.0000 mg | Freq: Four times a day (QID) | INTRAMUSCULAR | Status: DC | PRN
  Administered 2020-10-01 – 2020-10-03 (×3): 4 mg via INTRAVENOUS
  Filled 2020-09-28 (×3): qty 2

## 2020-09-28 MED ORDER — HYDROMORPHONE HCL 1 MG/ML IJ SOLN
1.0000 mg | Freq: Once | INTRAMUSCULAR | Status: AC
  Administered 2020-09-28: 1 mg via INTRAVENOUS
  Filled 2020-09-28: qty 1

## 2020-09-28 MED ORDER — HYDROMORPHONE HCL 1 MG/ML IJ SOLN
1.0000 mg | Freq: Once | INTRAMUSCULAR | Status: AC
Start: 2020-09-28 — End: 2020-09-28
  Administered 2020-09-28: 1 mg via INTRAVENOUS
  Filled 2020-09-28: qty 1

## 2020-09-28 MED ORDER — ONDANSETRON HCL 4 MG PO TABS: 4.0000 mg | ORAL_TABLET | Freq: Four times a day (QID) | ORAL | Status: DC | PRN

## 2020-09-28 MED ORDER — GLYCERIN (LAXATIVE) 2.1 G RE SUPP
1.0000 | RECTAL | Status: DC | PRN
  Filled 2020-09-28: qty 1

## 2020-09-28 MED ORDER — HYDROMORPHONE HCL 2 MG/ML IJ SOLN
2.0000 mg | INTRAMUSCULAR | Status: DC | PRN
  Administered 2020-09-29 – 2020-10-03 (×7): 2 mg via INTRAVENOUS
  Filled 2020-09-28 (×7): qty 1

## 2020-09-28 MED ORDER — ENOXAPARIN SODIUM 30 MG/0.3ML IJ SOSY
30.0000 mg | PREFILLED_SYRINGE | INTRAMUSCULAR | Status: DC
  Administered 2020-09-30 – 2020-10-03 (×4): 30 mg via SUBCUTANEOUS
  Filled 2020-09-28 (×4): qty 0.3

## 2020-09-28 MED ORDER — SODIUM CHLORIDE 0.9 % IV BOLUS
500.0000 mL | Freq: Once | INTRAVENOUS | Status: AC
  Administered 2020-09-28: 500 mL via INTRAVENOUS

## 2020-09-28 MED ORDER — MORPHINE SULFATE ER 15 MG PO TBCR
60.0000 mg | EXTENDED_RELEASE_TABLET | Freq: Three times a day (TID) | ORAL | Status: DC
  Administered 2020-09-28 – 2020-10-05 (×19): 60 mg via ORAL
  Filled 2020-09-28 (×19): qty 4

## 2020-09-28 MED ORDER — LACTATED RINGERS IV SOLN: INTRAVENOUS | Status: DC

## 2020-09-28 MED ORDER — SORBITOL 70 % SOLN
960.0000 mL | TOPICAL_OIL | Freq: Once | ORAL | Status: AC
  Administered 2020-09-28: 960 mL via RECTAL
  Filled 2020-09-28: qty 473

## 2020-09-28 NOTE — ED Notes (Signed)
Patient transported to CT 

## 2020-09-28 NOTE — H&P (Signed)
History and Physical   Andre Glass UXL:[id number] DOB: 1950/11/30 DOA: 09/28/2020  Referring MD/NP/PA: Dr. Melina Copa  PCP: [name], DO   Outpatient Specialists: Dr. [name], oncology  Patient coming from: Home  Chief Complaint: Constipation  HPI: Andre Glass is a 70 y.o. male with medical history significant of metastatic prostate cancer locally invasive, anemia of chronic disease, history of kidney stones, history of recurrent UTI, recurrent fecal impaction due to narcotics, who presents to the ER with complaint of intractable pain in his pelvic area and back requiring more more narcotics as well as constipation.  Patient was seen and evaluated in the ER.  He was disimpacted.  His constipation is believed to be secondary to narcotics.  On suppositories at home.  He was found to be fecal occult blood test positive with hemoglobin less than 8.  As part of the work-up also patient was noted to have locally invasive cancer on CT with bilateral hydronephrosis which is moderate size.  Urology consulted.  Recommends admission and they will see in the morning.  More than likely patient will need nephrostomy tubes.  He is having pain at 10 out of 10 despite Dilaudid and MS Contin.  No fever or chills.  No sick contacts.  ED Course: Temperature is 98.3 blood pressure 222/106, pulse 85 respiratory rate of 18 oxygen sat 99% on room air.  White count 6.3 hemoglobin 7.9 and platelets 286.  Sodium 137 potassium 4.5 chloride 100 CO2 27 BUN 78 creatinine 4.40 and calcium 9.1.  COVID-19 screen is negative.  Fecal occult blood testing is negative x2.  X-ray of the hip shows sclerotic changes within the pelvis but no other osseous abnormalities.  CT renal stone study shows locally invasive prostate cancer with multiple lobulated masses in the base of the bladder and anterior wall of the rectum.  This has led to a ureterovesicular junction obstruction with bilateral progressing moderate bilateral hydronephrosis.   Urology consulted and patient being admitted to the hospital for further evaluation and treatment  Review of Systems: As per HPI otherwise 10 point review of systems negative.    Past Medical History:  Diagnosis Date   Anemia    History of kidney stones    Prostate cancer ([place])    UTI (urinary tract infection)     Past Surgical History:  Procedure Laterality Date   IR IMAGING GUIDED PORT INSERTION  01/31/2020   TRANSURETHRAL RESECTION OF PROSTATE N/A 11/08/2019   Procedure: TRANSURETHRAL RESECTION OF THE PROSTATE (TURP);  Surgeon: [name], MD;  Location: [facility];  Service: Urology;  Laterality: N/A;     reports that he has never smoked. He has never used smokeless tobacco. He reports that he does not drink alcohol and does not use drugs.  Allergies  Allergen Reactions   Ibuprofen Anaphylaxis   Shrimp [Shellfish Allergy] Anaphylaxis    Family History  Problem Relation Age of Onset   Stomach cancer Mother    Pancreatic cancer Sister    Breast cancer Neg Hx    Prostate cancer Neg Hx    Colon cancer Neg Hx      Prior to Admission medications   Medication Sig Start Date End Date Taking? Authorizing Provider  abiraterone acetate (ZYTIGA) 250 MG tablet Take 4 tablets (1,000 mg total) by mouth daily. Take on an empty stomach 1 hour before or 2 hours after a meal Patient not taking: No sig reported 12/25/19   [name], MD  Ferrous Sulfate (  IRON PO) Take 1 tablet by mouth daily.    [provider]  glycerin adult 2 g suppository Place 1 suppository rectally as needed for constipation. 09/11/20   Carlisle Cater, PA-C  HYDROmorphone (DILAUDID) 4 MG tablet Take 1 tablet (4 mg total) by mouth every 4 (four) hours as needed for severe pain. 09/27/20   [name], MD  lidocaine-prilocaine (EMLA) cream Apply 1 application topically as needed. 01/15/20   [name], MD  morphine (MS CONTIN) 60 MG 12 hr tablet Take 1 tablet (60 mg total) by mouth every 8  (eight) hours. 09/27/20   [name], MD  Multiple Vitamin (MULTIVITAMIN WITH MINERALS) TABS tablet Take 1 tablet by mouth daily.    [provider]  NONFORMULARY OR COMPOUNDED ITEM Apply 1-2 g topically 4 (four) times daily. 04/24/20   Marzetta Board, DPM  ondansetron (ZOFRAN) 4 MG tablet Take 1 tablet (4 mg total) by mouth 3 (three) times daily as needed for nausea or vomiting. 09/24/20   [name], MD  Plecanatide (TRULANCE) 3 MG TABS Take 3 mg by mouth daily. 08/08/20   [name], DO  polyethylene glycol (MIRALAX) 17 g packet Take 17 g by mouth 2 (two) times daily. 01/19/20   Hongalgi, Lenis Dickinson, MD  predniSONE (DELTASONE) 5 MG tablet Take 1 tablet (5 mg total) by mouth daily with breakfast. 10/25/19   [name], MD  prochlorperazine (COMPAZINE) 10 MG tablet Take 1 tablet (10 mg total) by mouth every 6 (six) hours as needed for nausea or vomiting. 09/24/20   [name], MD  senna (SENOKOT) 8.6 MG TABS tablet Take 2 tablets by mouth daily.    [provider]  tamsulosin (FLOMAX) 0.4 MG CAPS capsule TAKE 1 CAPSULE (0.4 MG TOTAL) BY MOUTH DAILY AS NEEDED (TO HELP WITH URINATION). 09/03/20   [name], DO    Physical Exam: Vitals:   09/28/20 1954 09/28/20 2000 09/28/20 2015 09/28/20 2030  BP: (!) 197/90 (!) 193/91 (!) 198/99 (!) 197/94  Pulse: 77 71 74 73  Resp: 17   16  Temp:      TempSrc:      SpO2: 99% 99% 100% 100%      Constitutional: Chronically ill looking in distress due to pain Vitals:   09/28/20 1954 09/28/20 2000 09/28/20 2015 09/28/20 2030  BP: (!) 197/90 (!) 193/91 (!) 198/99 (!) 197/94  Pulse: 77 71 74 73  Resp: 17   16  Temp:      TempSrc:      SpO2: 99% 99% 100% 100%   Eyes: PERRL, lids and conjunctivae normal ENMT: Mucous membranes are dry. Posterior pharynx clear of any exudate or lesions.Normal dentition.  Neck: normal, supple, no masses, no thyromegaly Respiratory: clear to auscultation bilaterally, no wheezing, no  crackles. Normal respiratory effort. No accessory muscle use.  Cardiovascular: Regular rate and rhythm, no murmurs / rubs / gallops. No extremity edema. 2+ pedal pulses. No carotid bruits.  Abdomen: no tenderness, no masses palpated. No hepatosplenomegaly. Bowel sounds positive.  Musculoskeletal: no clubbing / cyanosis. No joint deformity upper and lower extremities. Good ROM, no contractures. Normal muscle tone.  Skin: no rashes, lesions, ulcers. No induration Neurologic: CN 2-12 grossly intact. Sensation intact, DTR normal. Strength 5/5 in all 4.  Psychiatric: Normal judgment and insight. Alert and oriented x 3.  Depressed mood.     Labs on Admission: I have personally reviewed following labs and imaging studies  CBC: Recent  Labs  Lab 09/28/20 1757  WBC 6.3  NEUTROABS 4.8  HGB 7.9*  HCT 26.1*  MCV 92.2  PLT 937   Basic Metabolic Panel: Recent Labs  Lab 09/28/20 1757  NA 137  K 4.5  CL 100  CO2 27  GLUCOSE 117*  BUN 78*  CREATININE 4.40*  CALCIUM 9.1   GFR: Estimated Creatinine Clearance: 17.5 mL/min (A) (by C-G formula based on SCr of 4.4 mg/dL (H)). Liver Function Tests: No results for input(s): AST, ALT, ALKPHOS, BILITOT, PROT, ALBUMIN in the last 168 hours. No results for input(s): LIPASE, AMYLASE in the last 168 hours. No results for input(s): AMMONIA in the last 168 hours. Coagulation Profile: No results for input(s): INR, PROTIME in the last 168 hours. Cardiac Enzymes: No results for input(s): CKTOTAL, CKMB, CKMBINDEX, TROPONINI in the last 168 hours. BNP (last 3 results) No results for input(s): PROBNP in the last 8760 hours. HbA1C: No results for input(s): HGBA1C in the last 72 hours. CBG: No results for input(s): GLUCAP in the last 168 hours. Lipid Profile: No results for input(s): CHOL, HDL, LDLCALC, TRIG, CHOLHDL, LDLDIRECT in the last 72 hours. Thyroid Function Tests: No results for input(s): TSH, T4TOTAL, FREET4, T3FREE, THYROIDAB in the last 72  hours. Anemia Panel: No results for input(s): VITAMINB12, FOLATE, FERRITIN, TIBC, IRON, RETICCTPCT in the last 72 hours. Urine analysis:    Component Value Date/Time   COLORURINE YELLOW 07/04/2020 1450   APPEARANCEUR HAZY (A) 07/04/2020 1450   LABSPEC 1.013 07/04/2020 1450   PHURINE 6.0 07/04/2020 1450   GLUCOSEU NEGATIVE 07/04/2020 1450   HGBUR LARGE (A) 07/04/2020 1450   BILIRUBINUR NEGATIVE 07/04/2020 1450   BILIRUBINUR negative 09/14/2019 1105   KETONESUR NEGATIVE 07/04/2020 1450   PROTEINUR 100 (A) 07/04/2020 1450   UROBILINOGEN 0.2 09/14/2019 1105   NITRITE NEGATIVE 07/04/2020 1450   LEUKOCYTESUR NEGATIVE 07/04/2020 1450   Sepsis Labs: @LABRCNTIP (procalcitonin:4,lacticidven:4) ) Recent Results (from the past 240 hour(s))  Resp Panel by RT-PCR (Flu A&B, Covid) Nasopharyngeal Swab     Status: None   Collection Time: 09/28/20  7:52 PM   Specimen: Nasopharyngeal Swab; Nasopharyngeal(NP) swabs in vial transport medium  Result Value Ref Range Status   SARS Coronavirus 2 by RT PCR NEGATIVE NEGATIVE Final    Comment: (NOTE) SARS-CoV-2 target nucleic acids are NOT DETECTED.  The SARS-CoV-2 RNA is generally detectable in upper respiratory specimens during the acute phase of infection. The lowest concentration of SARS-CoV-2 viral copies this assay can detect is 138 copies/mL. A negative result does not preclude SARS-Cov-2 infection and should not be used as the sole basis for treatment or other patient management decisions. A negative result may occur with  improper specimen collection/handling, submission of specimen other than nasopharyngeal swab, presence of viral mutation(s) within the areas targeted by this assay, and inadequate number of viral copies(<138 copies/mL). A negative result must be combined with clinical observations, patient history, and epidemiological information. The expected result is Negative.  Fact Sheet for Patients:   EntrepreneurPulse.com.au  Fact Sheet for Healthcare Providers:  IncredibleEmployment.be  This test is no t yet approved or cleared by the [place] FDA and  has been authorized for detection and/or diagnosis of SARS-CoV-2 by FDA under an Emergency Use Authorization (EUA). This EUA will remain  in effect (meaning this test can be used) for the duration of the COVID-19 declaration under Section 564(b)(1) of the Act, 21 U.S.C.section 360bbb-3(b)(1), unless the authorization is terminated  or revoked sooner.  Influenza A by PCR NEGATIVE NEGATIVE Final   Influenza B by PCR NEGATIVE NEGATIVE Final    Comment: (NOTE) The Xpert Xpress SARS-CoV-2/FLU/RSV plus assay is intended as an aid in the diagnosis of influenza from Nasopharyngeal swab specimens and should not be used as a sole basis for treatment. Nasal washings and aspirates are unacceptable for Xpert Xpress SARS-CoV-2/FLU/RSV testing.  Fact Sheet for Patients: EntrepreneurPulse.com.au  Fact Sheet for Healthcare Providers: IncredibleEmployment.be  This test is not yet approved or cleared by the [place] FDA and has been authorized for detection and/or diagnosis of SARS-CoV-2 by FDA under an Emergency Use Authorization (EUA). This EUA will remain in effect (meaning this test can be used) for the duration of the COVID-19 declaration under Section 564(b)(1) of the Act, 21 U.S.C. section 360bbb-3(b)(1), unless the authorization is terminated or revoked.  Performed at [facility], [place] [address], [place], [place] [address]      Radiological Exams on Admission: CT Renal Stone Study  Result Date: 09/28/2020 CLINICAL DATA:  Acute renal failure, right hip pain EXAM: CT ABDOMEN AND PELVIS WITHOUT CONTRAST TECHNIQUE: Multidetector CT imaging of the abdomen and pelvis was performed following the standard protocol without IV  contrast. COMPARISON:  06/14/2020 FINDINGS: Lower chest: Visualized lung bases are clear. The visualized heart and pericardium are unremarkable. Hepatobiliary: No focal liver abnormality is seen. No gallstones, gallbladder wall thickening, or biliary dilatation. Pancreas: Unremarkable Spleen: Unremarkable Adrenals/Urinary Tract: Multiple lobulated mural masses are again identified within the base of the bladder which appear confluent with the dome of the prostate gland. There is, additionally, confluent soft tissue with the prostate gland resulting in thickening of the anterior wall of the rectum, best seen on axial image # 82/2. Altogether, these are in keeping with a locally invasive prostate malignancy with invasion of the base of the bladder as well as the anterior wall the rectum. This appears similar to that seen on prior examination though exact measurement is difficult given the irregular contour of the mass and lack of contrast administration. There is resultant progressive, moderate bilateral hydronephrosis with obstruction of the ureterovesicular junctions bilaterally by the lobulated mass. Renal cortical thickness has been preserved. The kidneys are normal in size and position. No superimposed intrarenal masses or calcifications are seen. The adrenal glands are unremarkable. Stomach/Bowel: Aside from the eccentric mural mass involving the anterior wall of the rectum, the stomach, small bowel, and large bowel are unremarkable. No evidence of obstruction or focal inflammation. Appendix normal. No free intraperitoneal gas or fluid. Vascular/Lymphatic: There is progressive retroperitoneal and pelvic adenopathy. By example a new periaortic lymph node has developed at axial image # 46/2 measuring 12 mm x 22 mm. Pathologic adenopathy is now seen within the left periaortic, bilateral common iliac, external iliac, and perirectal lymph node groups. Presacral edema appears similar. Mild aortoiliac atherosclerotic  calcification is present. No aortic aneurysm. Reproductive: As noted above, the prostate gland is confluent with multiple lobulated soft tissue mass is invading the base of the bladder and anterior wall of the rectum in keeping with a primary prostatic malignancy. Other: Increasing anasarca with subcutaneous edema involving the body wall. Musculoskeletal: Multiple sclerotic metastases are again seen throughout the visualized axial skeleton which appears slightly progressive since prior examination. By example a. sclerotic metastasis involving the L4 vertebral body has slightly increased in size, best appreciated on sagittal reformats. No pathologic fracture IMPRESSION: Locally invasive prostate malignancy with multiple lobulated masses involving the base of the bladder as well  as the anterior wall of the rectum. Resultant obstruction of the ureterovesicular junctions bilaterally with progressive, moderate bilateral hydronephrosis. Preserved renal parenchymal volume. Interval disease progression with development of extensive retroperitoneal and pelvic adenopathy as well as slight interval progression of widespread sclerotic osseous metastases. Electronically Signed   By: Fidela Salisbury MD   On: 09/28/2020 22:21   DG Hip Unilat With Pelvis 2-3 Views Right  Result Date: 09/28/2020 CLINICAL DATA:  Status post fall 3 days ago with right hip pain. History of prostate cancer. EXAM: DG HIP (WITH OR WITHOUT PELVIS) 2-3V RIGHT COMPARISON:  Abdomen and pelvis CT, dated Andre 11, 2022 FINDINGS: There is no evidence of an acute hip fracture or dislocation. Degenerative changes are seen involving both hips in the form of joint space narrowing and acetabular sclerosis. Ill-defined sclerotic changes are seen involving the medial aspects of the bilateral inferior pubic rami and lateral aspect of the lower right iliac bone. Soft tissue structures are unremarkable. IMPRESSION: 1. No acute osseous abnormality. 2. Sclerotic changes  within the pelvis, as described above, consistent with patient's known history of osseous metastasis. Electronically Signed   By: Virgina Norfolk M.D.   On: 09/28/2020 18:52      Assessment/Plan Principal Problem:   Obstructive uropathy Active Problems:   Metastatic castration-resistant adenocarcinoma of prostate ([place])   Drug induced constipation   Intractable pain   AKI (acute kidney injury) ([place])   Hydronephrosis     #1 obstructive uropathy: Secondary to locally invasive prostate cancer.  Bilateral hydronephrosis noted.  Urology to see patient in the morning.  Possibly a candidate for bilateral nephrostomy tubes and not stenting.  #2 AKI on CKD: Secondary to obstructive uropathy.  Creatinine more than 4.  We will follow urology input and intervention.  #3 drug-induced constipation: Status post disimpaction in the ER.  Patient will need scheduled laxatives due to high dose of narcotics.  #4 metastatic prostate cancer: Patient being followed by oncology.  Appears to be resistant to some treatments.  We will defer to oncology.  #5 severe intractable pain: Continue with the long-acting MS Contin.  Add Dilaudid IV.  On 4 mg every 4 hours of Dilaudid at home.  #6 hypertensive urgency: Blood pressure is markedly elevated probably pain related.  We will address accordingly with IV labetalol  DVT prophylaxis: Lovenox Code Status: Full code Family Communication: No family at bedside Disposition Plan: To be determined Consults called: [facility] Admission status: Inpatient  Severity of Illness: The appropriate patient status for this patient is INPATIENT. Inpatient status is judged to be reasonable and necessary in order to provide the required intensity of service to ensure the patient's safety. The patient's presenting symptoms, physical exam findings, and initial radiographic and laboratory data in the context of their chronic comorbidities is felt to place them at high risk for  further clinical deterioration. Furthermore, it is not anticipated that the patient will be medically stable for discharge from the hospital within 2 midnights of admission. The following factors support the patient status of inpatient.   " The patient's presenting symptoms include constipation. " The worrisome physical exam findings include cachexia. " The initial radiographic and laboratory data are worrisome because of CT evidence of obstructive uropathy. " The chronic co-morbidities include metastatic prostate cancer.   * I certify that at the point of admission it is my clinical judgment that the patient will require inpatient hospital care spanning beyond 2 midnights from the point of admission due to high intensity  of service, high risk for further deterioration and high frequency of surveillance required.Barbette Merino MD [facility] Pager [phone]  If 7PM-7AM, please contact night-coverage [email or web address] Password [facility]  09/28/2020, 10:54 PM

## 2020-09-28 NOTE — ED Notes (Signed)
ED Provider at bedside. 

## 2020-09-28 NOTE — ED Provider Notes (Signed)
[place] DEPT Provider Note   CSN: [id number] Arrival date & time: 09/28/20  1733     History Chief Complaint  Patient presents with   Leg Swelling   Leg Pain   Constipation    Andre Glass is a 70 y.o. male.  He has a history of prostate cancer and drug-induced constipation.  He is complaining of not moving his bowels for over a week.  He has been using multiple laxatives without improvement.  He is on chronic narcotics for pelvic and hip pain due to his prostate cancer.  He says they just increased his MS Contin.  No fevers or chills nausea or vomiting.  Has some chronic abdominal pain but does not feel worsen.  He did have a fall yesterday but he said he landed on his left side.  He also has noted some increased swelling in his feet that have been going on for few weeks.  He has more longstanding neuropathy in his hands and his feet due to the chemotherapy.  The history is provided by the patient.  Leg Pain Location:  Hip Injury: no   Hip location:  R hip Pain details:    Quality:  Aching and shooting   Radiates to:  R leg   Severity:  Severe   Onset quality:  Gradual   Timing:  Constant   Progression:  Worsening Chronicity:  Chronic Relieved by:  Nothing Worsened by:  Adduction, abduction and bearing weight Ineffective treatments: narcotics. Associated symptoms: no fever and no neck pain   Constipation Severity:  Severe Time since last bowel movement:  7 days Progression:  Unchanged Chronicity:  Recurrent Context: narcotics   Stool description:  Hard and pellet like Relieved by:  Nothing Worsened by:  Nothing Ineffective treatments:  Defecation and laxatives Associated symptoms: abdominal pain   Associated symptoms: no diarrhea, no dysuria, no fever and no vomiting       Past Medical History:  Diagnosis Date   Anemia    History of kidney stones    Prostate cancer ([place])    UTI (urinary tract infection)     Patient Active  Problem List   Diagnosis Date Noted   Intractable pain 01/18/2020   BPH (benign prostatic hyperplasia) 11/08/2019   Catheter-associated urinary tract infection ([place]) 09/14/2019   Drug induced constipation 07/18/2019   Goals of care, counseling/discussion 06/26/2019   Metastatic castration-resistant adenocarcinoma of prostate ([place]) 04/19/2019    Past Surgical History:  Procedure Laterality Date   IR IMAGING GUIDED PORT INSERTION  01/31/2020   TRANSURETHRAL RESECTION OF PROSTATE N/A 11/08/2019   Procedure: TRANSURETHRAL RESECTION OF THE PROSTATE (TURP);  Surgeon: [name], MD;  Location: [facility];  Service: Urology;  Laterality: N/A;       Family History  Problem Relation Age of Onset   Stomach cancer Mother    Pancreatic cancer Sister    Breast cancer Neg Hx    Prostate cancer Neg Hx    Colon cancer Neg Hx     Social History   Tobacco Use   Smoking status: Never   Smokeless tobacco: Never  Vaping Use   Vaping Use: Never used  Substance Use Topics   Alcohol use: Never   Drug use: Never    Home Medications Prior to Admission medications   Medication Sig Start Date End Date Taking? Authorizing Provider  abiraterone acetate (ZYTIGA) 250 MG tablet Take 4 tablets (1,000 mg total) by mouth daily. Take on an  empty stomach 1 hour before or 2 hours after a meal Patient not taking: No sig reported 12/25/19   [name], MD  Ferrous Sulfate (IRON PO) Take 1 tablet by mouth daily.    [provider]  glycerin adult 2 g suppository Place 1 suppository rectally as needed for constipation. 09/11/20   Carlisle Cater, PA-C  HYDROmorphone (DILAUDID) 4 MG tablet Take 1 tablet (4 mg total) by mouth every 4 (four) hours as needed for severe pain. 09/27/20   [name], MD  lidocaine-prilocaine (EMLA) cream Apply 1 application topically as needed. 01/15/20   [name], MD  morphine (MS CONTIN) 60 MG 12 hr tablet Take 1 tablet (60 mg total) by mouth every 8 (eight)  hours. 09/27/20   [name], MD  Multiple Vitamin (MULTIVITAMIN WITH MINERALS) TABS tablet Take 1 tablet by mouth daily.    [provider]  NONFORMULARY OR COMPOUNDED ITEM Apply 1-2 g topically 4 (four) times daily. 04/24/20   Marzetta Board, DPM  ondansetron (ZOFRAN) 4 MG tablet Take 1 tablet (4 mg total) by mouth 3 (three) times daily as needed for nausea or vomiting. 09/24/20   [name], MD  Plecanatide (TRULANCE) 3 MG TABS Take 3 mg by mouth daily. 08/08/20   [name], DO  polyethylene glycol (MIRALAX) 17 g packet Take 17 g by mouth 2 (two) times daily. 01/19/20   Hongalgi, Lenis Dickinson, MD  predniSONE (DELTASONE) 5 MG tablet Take 1 tablet (5 mg total) by mouth daily with breakfast. 10/25/19   [name], MD  prochlorperazine (COMPAZINE) 10 MG tablet Take 1 tablet (10 mg total) by mouth every 6 (six) hours as needed for nausea or vomiting. 09/24/20   [name], MD  senna (SENOKOT) 8.6 MG TABS tablet Take 2 tablets by mouth daily.    [provider]  tamsulosin (FLOMAX) 0.4 MG CAPS capsule TAKE 1 CAPSULE (0.4 MG TOTAL) BY MOUTH DAILY AS NEEDED (TO HELP WITH URINATION). 09/03/20   [name], DO    Allergies    Ibuprofen and Shrimp [shellfish allergy]  Review of Systems   Review of Systems  Constitutional:  Negative for fever.  HENT:  Negative for sore throat.   Eyes:  Negative for visual disturbance.  Respiratory:  Negative for shortness of breath.   Cardiovascular:  Positive for leg swelling. Negative for chest pain.  Gastrointestinal:  Positive for abdominal pain and constipation. Negative for diarrhea and vomiting.  Genitourinary:  Negative for dysuria.  Musculoskeletal:  Negative for neck pain.  Skin:  Negative for rash.  Neurological:  Negative for headaches.   Physical Exam Updated Vital Signs BP (!) 170/82 (BP Location: Right Arm)   Pulse 69   Temp 97.8 F (36.6 C) (Oral)   Resp 15   Ht 5\' 8"  (1.727 m)   Wt 95.6 kg   SpO2 99%    BMI 32.05 kg/m   Physical Exam Vitals and nursing note reviewed.  Constitutional:      Appearance: Normal appearance. He is well-developed.  HENT:     Head: Normocephalic and atraumatic.  Eyes:     Conjunctiva/sclera: Conjunctivae normal.  Cardiovascular:     Rate and Rhythm: Normal rate and regular rhythm.     Heart sounds: No murmur heard. Pulmonary:     Effort: Pulmonary effort is normal. No respiratory distress.     Breath sounds: Normal breath sounds.  Abdominal:     Palpations: Abdomen is soft.  Tenderness: There is no abdominal tenderness. There is no guarding or rebound.  Musculoskeletal:        General: No deformity.     Cervical back: Neck supple.     Right lower leg: Edema present.     Left lower leg: Edema present.  Skin:    General: Skin is warm and dry.  Neurological:     General: No focal deficit present.     Mental Status: He is alert.    ED Results / Procedures / Treatments   Labs (all labs ordered are listed, but only abnormal results are displayed) Labs Reviewed  BASIC METABOLIC PANEL - Abnormal; Notable for the following components:      Result Value   Glucose, Bld 117 (*)    BUN 78 (*)    Creatinine, Ser 4.40 (*)    GFR, Estimated 14 (*)    All other components within normal limits  CBC WITH DIFFERENTIAL/PLATELET - Abnormal; Notable for the following components:   RBC 2.83 (*)    Hemoglobin 7.9 (*)    HCT 26.1 (*)    All other components within normal limits  COMPREHENSIVE METABOLIC PANEL - Abnormal; Notable for the following components:   Glucose, Bld 107 (*)    BUN 78 (*)    Creatinine, Ser 4.66 (*)    Albumin 3.1 (*)    GFR, Estimated 13 (*)    All other components within normal limits  CBC - Abnormal; Notable for the following components:   RBC 3.10 (*)    Hemoglobin 8.7 (*)    HCT 28.8 (*)    All other components within normal limits  POC OCCULT BLOOD, ED - Abnormal; Notable for the following components:   Fecal Occult Bld  POSITIVE (*)    All other components within normal limits  RESP PANEL BY RT-PCR (FLU A&B, COVID) ARPGX2  URINE CULTURE  URINALYSIS, ROUTINE W REFLEX MICROSCOPIC  HIV ANTIBODY (ROUTINE TESTING W REFLEX)  HIV ANTIBODY (ROUTINE TESTING W REFLEX)    EKG None  Radiology CT Renal Stone Study  Result Date: 09/28/2020 CLINICAL DATA:  Acute renal failure, right hip pain EXAM: CT ABDOMEN AND PELVIS WITHOUT CONTRAST TECHNIQUE: Multidetector CT imaging of the abdomen and pelvis was performed following the standard protocol without IV contrast. COMPARISON:  06/14/2020 FINDINGS: Lower chest: Visualized lung bases are clear. The visualized heart and pericardium are unremarkable. Hepatobiliary: No focal liver abnormality is seen. No gallstones, gallbladder wall thickening, or biliary dilatation. Pancreas: Unremarkable Spleen: Unremarkable Adrenals/Urinary Tract: Multiple lobulated mural masses are again identified within the base of the bladder which appear confluent with the dome of the prostate gland. There is, additionally, confluent soft tissue with the prostate gland resulting in thickening of the anterior wall of the rectum, best seen on axial image # 82/2. Altogether, these are in keeping with a locally invasive prostate malignancy with invasion of the base of the bladder as well as the anterior wall the rectum. This appears similar to that seen on prior examination though exact measurement is difficult given the irregular contour of the mass and lack of contrast administration. There is resultant progressive, moderate bilateral hydronephrosis with obstruction of the ureterovesicular junctions bilaterally by the lobulated mass. Renal cortical thickness has been preserved. The kidneys are normal in size and position. No superimposed intrarenal masses or calcifications are seen. The adrenal glands are unremarkable. Stomach/Bowel: Aside from the eccentric mural mass involving the anterior wall of the rectum, the  stomach, small bowel, and  large bowel are unremarkable. No evidence of obstruction or focal inflammation. Appendix normal. No free intraperitoneal gas or fluid. Vascular/Lymphatic: There is progressive retroperitoneal and pelvic adenopathy. By example a new periaortic lymph node has developed at axial image # 46/2 measuring 12 mm x 22 mm. Pathologic adenopathy is now seen within the left periaortic, bilateral common iliac, external iliac, and perirectal lymph node groups. Presacral edema appears similar. Mild aortoiliac atherosclerotic calcification is present. No aortic aneurysm. Reproductive: As noted above, the prostate gland is confluent with multiple lobulated soft tissue mass is invading the base of the bladder and anterior wall of the rectum in keeping with a primary prostatic malignancy. Other: Increasing anasarca with subcutaneous edema involving the body wall. Musculoskeletal: Multiple sclerotic metastases are again seen throughout the visualized axial skeleton which appears slightly progressive since prior examination. By example a. sclerotic metastasis involving the L4 vertebral body has slightly increased in size, best appreciated on sagittal reformats. No pathologic fracture IMPRESSION: Locally invasive prostate malignancy with multiple lobulated masses involving the base of the bladder as well as the anterior wall of the rectum. Resultant obstruction of the ureterovesicular junctions bilaterally with progressive, moderate bilateral hydronephrosis. Preserved renal parenchymal volume. Interval disease progression with development of extensive retroperitoneal and pelvic adenopathy as well as slight interval progression of widespread sclerotic osseous metastases. Electronically Signed   By: Fidela Salisbury MD   On: 09/28/2020 22:21   DG Hip Unilat With Pelvis 2-3 Views Right  Result Date: 09/28/2020 CLINICAL DATA:  Status post fall 3 days ago with right hip pain. History of prostate cancer. EXAM: DG  HIP (WITH OR WITHOUT PELVIS) 2-3V RIGHT COMPARISON:  Abdomen and pelvis CT, dated June 14, 2020 FINDINGS: There is no evidence of an acute hip fracture or dislocation. Degenerative changes are seen involving both hips in the form of joint space narrowing and acetabular sclerosis. Ill-defined sclerotic changes are seen involving the medial aspects of the bilateral inferior pubic rami and lateral aspect of the lower right iliac bone. Soft tissue structures are unremarkable. IMPRESSION: 1. No acute osseous abnormality. 2. Sclerotic changes within the pelvis, as described above, consistent with patient's known history of osseous metastasis. Electronically Signed   By: Virgina Norfolk M.D.   On: 09/28/2020 18:52    Procedures Procedures   Medications Ordered in ED Medications  morphine (MS CONTIN) 12 hr tablet 60 mg (60 mg Oral Given 09/29/20 0826)  Glycerin (Adult) 2.1 g suppository 1 suppository (has no administration in time range)  enoxaparin (LOVENOX) injection 30 mg (has no administration in time range)  lactated ringers infusion ( Intravenous Restarted 09/29/20 0030)  HYDROmorphone (DILAUDID) injection 2 mg (has no administration in time range)  ondansetron (ZOFRAN) tablet 4 mg (has no administration in time range)    Or  ondansetron (ZOFRAN) injection 4 mg (has no administration in time range)  labetalol (NORMODYNE) injection 5 mg (5 mg Intravenous Given 09/29/20 0456)  HYDROmorphone (DILAUDID) injection 1 mg (1 mg Intravenous Given 09/28/20 1858)  sorbitol, milk of mag, mineral oil, glycerin (SMOG) enema (960 mLs Rectal Given 09/28/20 2048)  HYDROmorphone (DILAUDID) injection 1 mg (1 mg Intravenous Given 09/28/20 1951)  sodium chloride 0.9 % bolus 500 mL (0 mLs Intravenous Stopped 09/28/20 2315)  HYDROmorphone (DILAUDID) injection 1 mg (1 mg Intravenous Given 09/28/20 2151)    ED Course  I have reviewed the triage vital signs and the nursing notes.  Pertinent labs & imaging results that were  available during my care of the  patient were reviewed by me and considered in my medical decision making (see chart for details).  Clinical Course as of 09/29/20 2694  Sat Sep 28, 2020  1817 X-ray of pelvis and right hip do not show any obvious fracture or dislocations and no obvious bony mets. [MB]  1925 rectal exam done with nurse Janett Billow as chaperone.  Does have some hard stool up fairly high and unable to be displaced.  We will try enema. [MB]  2222 Patient received a smog enema with moderate stool results. [MB]  2230 His oncologist is Dr. [name] and he is currently on Lupron.  He had received radiation in the past. [MB]  2240 Discussed with Dr. Alinda [facility].  He feels that this obstruction is probably not amenable to retrograde stenting.  He feels he will likely need percutaneous nephrostomy tubes.  He recommends a medicine admission and they will consult on tomorrow. [MB]    Clinical Course User Index [MB] Hayden Rasmussen, MD   MDM Rules/Calculators/A&P                         This patient complains of constipation, right hip and leg pain, swelling in his feet; this involves an extensive number of treatment Options and is a complaint that carries with it a high risk of complications and Morbidity. The differential includes constipation, obstruction, fracture, bony metastases, metabolic derangement renal failure,   I ordered, reviewed and interpreted labs, which included CBC with normal white count, hemoglobin slightly lower than baseline, chemistries with new AKI, fecal occult positive I ordered medication IV pain medication and enema with some improvement in his symptoms I ordered imaging studies which included x-rays of the pelvis and right hip and CT renal and I independently    visualized and interpreted imaging which showed moderate hydro in the setting of prostatic mass bladder thickening Previous records obtained and reviewed in epic including prior oncology and  urology notes I consulted urology Dr. Alinda Money and [facility] hospitalist Dr. Darrick Meigs and discussed lab and imaging findings  Critical Interventions: None  After the interventions stated above, I reevaluated the patient and found patient to be somewhat improved although blood pressures remain elevated.  Due to his AKI will need to be admitted to the hospital for likely nephrostomy tubes.  He is in agreement with plan.   Final Clinical Impression(s) / ED Diagnoses Final diagnoses:  Prostate cancer ([place])  Drug-induced constipation  AKI (acute kidney injury) ([place])  Primary hypertension    Rx / DC Orders ED Discharge Orders     None        Hayden Rasmussen, MD 09/29/20 [phone]

## 2020-09-28 NOTE — ED Notes (Signed)
~  400 cc of SMOG enema administered. Pt had successful bowel movement post enema administration.

## 2020-09-28 NOTE — ED Notes (Signed)
ED TO INPATIENT HANDOFF REPORT  Name/Age/Gender Andre Glass 70 y.o. male  Code Status Code Status History    Date Active Date Inactive Code Status Order ID Comments User Context   01/18/2020 1135 01/19/2020 2316 Full Code [id number]  Jonnie Finner, DO Inpatient   11/08/2019 1331 11/09/2019 1732 Full Code [id number]  [name], MD Inpatient    Questions for Most Recent Historical Code Status (Order [id number])       Home/SNF/Other Home  Chief Complaint Obstructive uropathy [N13.9]  Level of Care/Admitting Diagnosis ED Disposition    ED Disposition  Admit   Condition  --   [place]: [facility] [[id number]]  Level of Care: Med-Surg [16]  May admit patient to [facility] or [facility] if equivalent level of care is available:: No  Covid Evaluation: Confirmed COVID Negative  Diagnosis: Obstructive uropathy [528413]  Admitting Physician: Elwyn Reach [[id number]]  Attending Physician: Elwyn Reach [[id number]]  Estimated length of stay: past midnight tomorrow  Certification:: I certify this patient will need inpatient services for at least 2 midnights         Medical History Past Medical History:  Diagnosis Date  . Anemia   . History of kidney stones   . Prostate cancer ([place])   . UTI (urinary tract infection)     Allergies Allergies  Allergen Reactions  . Ibuprofen Anaphylaxis  . Shrimp [Shellfish Allergy] Anaphylaxis    IV Location/Drains/Wounds Patient Lines/Drains/Airways Status    Active Line/Drains/Airways    Name Placement date Placement time Site Days   Implanted Port 01/31/20 Right Chest 01/31/20  1516  Chest  241   Incision (Closed) 11/08/19 N/A Other (Comment) 11/08/19  0928  -- 325          Labs/Imaging Results for orders placed or performed during the hospital encounter of 09/28/20 (from the past 48 hour(s))  Basic metabolic panel     Status: Abnormal   Collection Time: 09/28/20  5:57 PM  Result Value Ref  Range   Sodium 137 135 - 145 mmol/L   Potassium 4.5 3.5 - 5.1 mmol/L   Chloride 100 98 - 111 mmol/L   CO2 27 22 - 32 mmol/L   Glucose, Bld 117 (H) 70 - 99 mg/dL    Comment: Glucose reference range applies only to samples taken after fasting for at least 8 hours.   BUN 78 (H) 8 - 23 mg/dL   Creatinine, Ser 4.40 (H) 0.61 - 1.24 mg/dL   Calcium 9.1 8.9 - 10.3 mg/dL   GFR, Estimated 14 (L) >60 mL/min    Comment: (NOTE) Calculated using the CKD-EPI Creatinine Equation (2021)    Anion gap 10 5 - 15    Comment: Performed at [facility], [place] [address] [place], [place] [address]  CBC with Differential     Status: Abnormal   Collection Time: 09/28/20  5:57 PM  Result Value Ref Range   WBC 6.3 4.0 - 10.5 K/uL   RBC 2.83 (L) 4.22 - 5.81 MIL/uL   Hemoglobin 7.9 (L) 13.0 - 17.0 g/dL   HCT 26.1 (L) 39.0 - 52.0 %   MCV 92.2 80.0 - 100.0 fL   MCH 27.9 26.0 - 34.0 pg   MCHC 30.3 30.0 - 36.0 g/dL   RDW 14.9 11.5 - 15.5 %   Platelets 286 150 - 400 K/uL   nRBC 0.0 0.0 - 0.2 %   Neutrophils Relative % 76 %  Neutro Abs 4.8 1.7 - 7.7 K/uL   Lymphocytes Relative 15 %   Lymphs Abs 0.9 0.7 - 4.0 K/uL   Monocytes Relative 7 %   Monocytes Absolute 0.4 0.1 - 1.0 K/uL   Eosinophils Relative 1 %   Eosinophils Absolute 0.1 0.0 - 0.5 K/uL   Basophils Relative 0 %   Basophils Absolute 0.0 0.0 - 0.1 K/uL   Immature Granulocytes 1 %   Abs Immature Granulocytes 0.03 0.00 - 0.07 K/uL    Comment: Performed at [facility], [place] [address] [place], [place] [address]  POC occult blood, ED     Status: Abnormal   Collection Time: 09/28/20  7:26 PM  Result Value Ref Range   Fecal Occult Bld POSITIVE (A) NEGATIVE  Resp Panel by RT-PCR (Flu A&B, Covid) Nasopharyngeal Swab     Status: None   Collection Time: 09/28/20  7:52 PM   Specimen: Nasopharyngeal Swab; Nasopharyngeal(NP) swabs in vial transport medium  Result Value Ref Range   SARS Coronavirus 2 by RT PCR NEGATIVE  NEGATIVE    Comment: (NOTE) SARS-CoV-2 target nucleic acids are NOT DETECTED.  The SARS-CoV-2 RNA is generally detectable in upper respiratory specimens during the acute phase of infection. The lowest concentration of SARS-CoV-2 viral copies this assay can detect is 138 copies/mL. A negative result does not preclude SARS-Cov-2 infection and should not be used as the sole basis for treatment or other patient management decisions. A negative result may occur with  improper specimen collection/handling, submission of specimen other than nasopharyngeal swab, presence of viral mutation(s) within the areas targeted by this assay, and inadequate number of viral copies(<138 copies/mL). A negative result must be combined with clinical observations, patient history, and epidemiological information. The expected result is Negative.  Fact Sheet for Patients:  EntrepreneurPulse.com.au  Fact Sheet for Healthcare Providers:  IncredibleEmployment.be  This test is no t yet approved or cleared by the [place] FDA and  has been authorized for detection and/or diagnosis of SARS-CoV-2 by FDA under an Emergency Use Authorization (EUA). This EUA will remain  in effect (meaning this test can be used) for the duration of the COVID-19 declaration under Section 564(b)(1) of the Act, 21 U.S.C.section 360bbb-3(b)(1), unless the authorization is terminated  or revoked sooner.       Influenza A by PCR NEGATIVE NEGATIVE   Influenza B by PCR NEGATIVE NEGATIVE    Comment: (NOTE) The Xpert Xpress SARS-CoV-2/FLU/RSV plus assay is intended as an aid in the diagnosis of influenza from Nasopharyngeal swab specimens and should not be used as a sole basis for treatment. Nasal washings and aspirates are unacceptable for Xpert Xpress SARS-CoV-2/FLU/RSV testing.  Fact Sheet for Patients: EntrepreneurPulse.com.au  Fact Sheet for Healthcare  Providers: IncredibleEmployment.be  This test is not yet approved or cleared by the [place] FDA and has been authorized for detection and/or diagnosis of SARS-CoV-2 by FDA under an Emergency Use Authorization (EUA). This EUA will remain in effect (meaning this test can be used) for the duration of the COVID-19 declaration under Section 564(b)(1) of the Act, 21 U.S.C. section 360bbb-3(b)(1), unless the authorization is terminated or revoked.  Performed at [facility], [place] [address], [place], [place] [address]    CT Renal Stone Study  Result Date: 09/28/2020 CLINICAL DATA:  Acute renal failure, right hip pain EXAM: CT ABDOMEN AND PELVIS WITHOUT CONTRAST TECHNIQUE: Multidetector CT imaging of the abdomen and pelvis was performed following the standard protocol without IV contrast. COMPARISON:  06/14/2020 FINDINGS: Lower chest: Visualized lung bases are clear. The visualized heart and pericardium are unremarkable. Hepatobiliary: No focal liver abnormality is seen. No gallstones, gallbladder wall thickening, or biliary dilatation. Pancreas: Unremarkable Spleen: Unremarkable Adrenals/Urinary Tract: Multiple lobulated mural masses are again identified within the base of the bladder which appear confluent with the dome of the prostate gland. There is, additionally, confluent soft tissue with the prostate gland resulting in thickening of the anterior wall of the rectum, best seen on axial image # 82/2. Altogether, these are in keeping with a locally invasive prostate malignancy with invasion of the base of the bladder as well as the anterior wall the rectum. This appears similar to that seen on prior examination though exact measurement is difficult given the irregular contour of the mass and lack of contrast administration. There is resultant progressive, moderate bilateral hydronephrosis with obstruction of the ureterovesicular junctions bilaterally by the  lobulated mass. Renal cortical thickness has been preserved. The kidneys are normal in size and position. No superimposed intrarenal masses or calcifications are seen. The adrenal glands are unremarkable. Stomach/Bowel: Aside from the eccentric mural mass involving the anterior wall of the rectum, the stomach, small bowel, and large bowel are unremarkable. No evidence of obstruction or focal inflammation. Appendix normal. No free intraperitoneal gas or fluid. Vascular/Lymphatic: There is progressive retroperitoneal and pelvic adenopathy. By example a new periaortic lymph node has developed at axial image # 46/2 measuring 12 mm x 22 mm. Pathologic adenopathy is now seen within the left periaortic, bilateral common iliac, external iliac, and perirectal lymph node groups. Presacral edema appears similar. Mild aortoiliac atherosclerotic calcification is present. No aortic aneurysm. Reproductive: As noted above, the prostate gland is confluent with multiple lobulated soft tissue mass is invading the base of the bladder and anterior wall of the rectum in keeping with a primary prostatic malignancy. Other: Increasing anasarca with subcutaneous edema involving the body wall. Musculoskeletal: Multiple sclerotic metastases are again seen throughout the visualized axial skeleton which appears slightly progressive since prior examination. By example a. sclerotic metastasis involving the L4 vertebral body has slightly increased in size, best appreciated on sagittal reformats. No pathologic fracture IMPRESSION: Locally invasive prostate malignancy with multiple lobulated masses involving the base of the bladder as well as the anterior wall of the rectum. Resultant obstruction of the ureterovesicular junctions bilaterally with progressive, moderate bilateral hydronephrosis. Preserved renal parenchymal volume. Interval disease progression with development of extensive retroperitoneal and pelvic adenopathy as well as slight interval  progression of widespread sclerotic osseous metastases. Electronically Signed   By: Fidela Salisbury MD   On: 09/28/2020 22:21   DG Hip Unilat With Pelvis 2-3 Views Right  Result Date: 09/28/2020 CLINICAL DATA:  Status post fall 3 days ago with right hip pain. History of prostate cancer. EXAM: DG HIP (WITH OR WITHOUT PELVIS) 2-3V RIGHT COMPARISON:  Abdomen and pelvis CT, dated June 14, 2020 FINDINGS: There is no evidence of an acute hip fracture or dislocation. Degenerative changes are seen involving both hips in the form of joint space narrowing and acetabular sclerosis. Ill-defined sclerotic changes are seen involving the medial aspects of the bilateral inferior pubic rami and lateral aspect of the lower right iliac bone. Soft tissue structures are unremarkable. IMPRESSION: 1. No acute osseous abnormality. 2. Sclerotic changes within the pelvis, as described above, consistent with patient's known history of osseous metastasis. Electronically Signed   By: Virgina Norfolk M.D.   On: 09/28/2020 18:52    Pending Labs [employer] (  From admission, onward)    Start     Ordered   09/28/20 2238  Urinalysis, Routine w reflex microscopic  ONCE - STAT,   STAT        09/28/20 2237   09/28/20 2238  Urine culture  ONCE - STAT,   STAT        09/28/20 2237   Signed and Held  HIV Antibody (routine testing w rflx)  (HIV Antibody (Routine testing w reflex) panel)  Once,   R        Signed and Held   Signed and Held  CBC  (enoxaparin (LOVENOX)    CrCl >/= 30 ml/min)  Once,   R       Comments: Baseline for enoxaparin therapy IF NOT ALREADY DRAWN.  Notify MD if PLT < 100 K.    Signed and Held   Signed and Held  Creatinine, serum  (enoxaparin (LOVENOX)    CrCl >/= 30 ml/min)  Once,   R       Comments: Baseline for enoxaparin therapy IF NOT ALREADY DRAWN.    Signed and Held   Signed and Held  Creatinine, serum  (enoxaparin (LOVENOX)    CrCl >/= 30 ml/min)  Weekly,   R     Comments: while on enoxaparin  therapy    Signed and Held   Signed and Held  Comprehensive metabolic panel  Tomorrow morning,   R        Signed and Held   Signed and Held  CBC  Tomorrow morning,   R        Signed and Held          Vitals/Pain Today's Vitals   09/28/20 2000 09/28/20 2015 09/28/20 2030 09/28/20 2107  BP: (!) 193/91 (!) 198/99 (!) 197/94   Pulse: 71 74 73   Resp:   16   Temp:      TempSrc:      SpO2: 99% 100% 100%   PainSc:    5     Isolation Precautions Airborne and Contact precautions  Medications Medications  labetalol (NORMODYNE) injection 5 mg (has no administration in time range)  HYDROmorphone (DILAUDID) injection 1 mg (1 mg Intravenous Given 09/28/20 1858)  sorbitol, milk of mag, mineral oil, glycerin (SMOG) enema (960 mLs Rectal Given 09/28/20 2048)  HYDROmorphone (DILAUDID) injection 1 mg (1 mg Intravenous Given 09/28/20 1951)  sodium chloride 0.9 % bolus 500 mL (500 mLs Intravenous New Bag/Given 09/28/20 2144)  HYDROmorphone (DILAUDID) injection 1 mg (1 mg Intravenous Given 09/28/20 2151)    Mobility walks with device

## 2020-09-28 NOTE — ED Triage Notes (Signed)
Pt c/o right-sided pain from hip down, BLLE edema and constipation x 7 days.

## 2020-09-28 NOTE — ED Notes (Signed)
~  200 cc of SMOG enema administered at this time. Will monitor for bowel movement.

## 2020-09-28 NOTE — ED Notes (Signed)
MD made aware of pts elevated BP.

## 2020-09-29 ENCOUNTER — Encounter (HOSPITAL_COMMUNITY): Payer: Self-pay | Admitting: Internal Medicine

## 2020-09-29 ENCOUNTER — Inpatient Hospital Stay (HOSPITAL_COMMUNITY): Payer: Medicare Other

## 2020-09-29 DIAGNOSIS — C61 Malignant neoplasm of prostate: Principal | ICD-10-CM

## 2020-09-29 DIAGNOSIS — K5903 Drug induced constipation: Secondary | ICD-10-CM

## 2020-09-29 DIAGNOSIS — N1330 Unspecified hydronephrosis: Secondary | ICD-10-CM

## 2020-09-29 DIAGNOSIS — I10 Essential (primary) hypertension: Secondary | ICD-10-CM

## 2020-09-29 HISTORY — PX: IR NEPHROSTOMY PLACEMENT LEFT: IMG6063

## 2020-09-29 HISTORY — PX: IR NEPHROSTOMY PLACEMENT RIGHT: IMG6064

## 2020-09-29 LAB — COMPREHENSIVE METABOLIC PANEL
ALT: 11 U/L (ref 0–44)
AST: 20 U/L (ref 15–41)
Albumin: 3.1 g/dL — ABNORMAL LOW (ref 3.5–5.0)
Alkaline Phosphatase: 81 U/L (ref 38–126)
Anion gap: 9 (ref 5–15)
BUN: 78 mg/dL — ABNORMAL HIGH (ref 8–23)
CO2: 26 mmol/L (ref 22–32)
Calcium: 9 mg/dL (ref 8.9–10.3)
Chloride: 103 mmol/L (ref 98–111)
Creatinine, Ser: 4.66 mg/dL — ABNORMAL HIGH (ref 0.61–1.24)
GFR, Estimated: 13 mL/min — ABNORMAL LOW (ref >60)
Glucose, Bld: 107 mg/dL — ABNORMAL HIGH (ref 70–99)
Potassium: 4.3 mmol/L (ref 3.5–5.1)
Sodium: 138 mmol/L (ref 135–145)
Total Bilirubin: 0.5 mg/dL (ref 0.3–1.2)
Total Protein: 8 g/dL (ref 6.5–8.1)

## 2020-09-29 LAB — CBC
HCT: 28.8 % — ABNORMAL LOW (ref 39.0–52.0)
Hemoglobin: 8.7 g/dL — ABNORMAL LOW (ref 13.0–17.0)
MCH: 28.1 pg (ref 26.0–34.0)
MCHC: 30.2 g/dL (ref 30.0–36.0)
MCV: 92.9 fL (ref 80.0–100.0)
Platelets: 317 10*3/uL (ref 150–400)
RBC: 3.1 MIL/uL — ABNORMAL LOW (ref 4.22–5.81)
RDW: 14.8 % (ref 11.5–15.5)
WBC: 7.9 10*3/uL (ref 4.0–10.5)
nRBC: 0 % (ref 0.0–0.2)

## 2020-09-29 LAB — HIV ANTIBODY (ROUTINE TESTING W REFLEX): HIV Screen 4th Generation wRfx: NONREACTIVE

## 2020-09-29 LAB — URINALYSIS, ROUTINE W REFLEX MICROSCOPIC
Bilirubin Urine: NEGATIVE
Glucose, UA: NEGATIVE mg/dL
Ketones, ur: NEGATIVE mg/dL
Leukocytes,Ua: NEGATIVE
Nitrite: NEGATIVE
Protein, ur: 100 mg/dL — AB
RBC / HPF: >50 RBC/hpf — ABNORMAL HIGH (ref 0–5)
Specific Gravity, Urine: 1.01 (ref 1.005–1.030)
pH: 7.5 (ref 5.0–8.0)

## 2020-09-29 MED ORDER — LIDOCAINE HCL 1 % IJ SOLN
INTRAMUSCULAR | Status: AC | PRN
  Administered 2020-09-29: 20 mL

## 2020-09-29 MED ORDER — LIDOCAINE HCL 1 % IJ SOLN
INTRAMUSCULAR | Status: AC
  Filled 2020-09-29: qty 40

## 2020-09-29 MED ORDER — PREDNISONE 5 MG PO TABS
5.0000 mg | ORAL_TABLET | Freq: Every day | ORAL | Status: DC
  Administered 2020-09-29 – 2020-10-05 (×7): 5 mg via ORAL
  Filled 2020-09-29 (×7): qty 1

## 2020-09-29 MED ORDER — SENNOSIDES-DOCUSATE SODIUM 8.6-50 MG PO TABS
2.0000 | ORAL_TABLET | Freq: Two times a day (BID) | ORAL | Status: DC
  Administered 2020-09-29 – 2020-10-05 (×13): 2 via ORAL
  Filled 2020-09-29 (×13): qty 2

## 2020-09-29 MED ORDER — AMLODIPINE BESYLATE 10 MG PO TABS
10.0000 mg | ORAL_TABLET | Freq: Every day | ORAL | Status: DC
  Administered 2020-09-29 – 2020-10-05 (×7): 10 mg via ORAL
  Filled 2020-09-29 (×7): qty 1

## 2020-09-29 MED ORDER — IOHEXOL 300 MG/ML  SOLN
50.0000 mL | Freq: Once | INTRAMUSCULAR | Status: AC | PRN
  Administered 2020-09-29: 20 mL

## 2020-09-29 MED ORDER — MIDAZOLAM HCL 2 MG/2ML IJ SOLN
INTRAMUSCULAR | Status: AC
  Filled 2020-09-29: qty 2

## 2020-09-29 MED ORDER — FENTANYL CITRATE (PF) 100 MCG/2ML IJ SOLN
INTRAMUSCULAR | Status: AC
  Filled 2020-09-29: qty 2

## 2020-09-29 MED ORDER — FENTANYL CITRATE (PF) 100 MCG/2ML IJ SOLN
INTRAMUSCULAR | Status: AC | PRN
  Administered 2020-09-29 (×2): 25 ug via INTRAVENOUS
  Administered 2020-09-29: 50 ug via INTRAVENOUS

## 2020-09-29 MED ORDER — MIDAZOLAM HCL 2 MG/2ML IJ SOLN
INTRAMUSCULAR | Status: AC | PRN
  Administered 2020-09-29 (×2): 0.5 mg via INTRAVENOUS
  Administered 2020-09-29: 1 mg via INTRAVENOUS

## 2020-09-29 NOTE — Consult Note (Signed)
Chief Complaint: Patient was seen in consultation today for obstructive uropathy  Referring Physician(s): Dr. Alinda Money  Supervising Physician: Corrie Mckusick  Patient Status: [facility] - In-pt  History of Present Illness: Andre Glass is a 70 y.o. male with past medical history of metastatic prostate cancer who presented to [facility] ED with extremity edema, weakness, and poor appetite.  He was noted to have acute kidney injury with SCr elevated above baseline.    CT Renal 09/28/20 showed: Locally invasive prostate malignancy with multiple lobulated masses involving the base of the bladder as well as the anterior wall of the rectum. Resultant obstruction of the ureterovesicular junctions bilaterally with progressive, moderate bilateral hydronephrosis. Preserved renal parenchymal volume.   Interval disease progression with development of extensive retroperitoneal and pelvic adenopathy as well as slight interval progression of widespread sclerotic osseous metastases.  He was evaluated by Urology who has recommended IR placement of bilateral nephrostomy tubes. Case reviewed and approved by Dr. Earleen Glass.   Andre Glass is assessed at bedside today. He is sleepy, but arouses and is able to demonstrate understanding of the procedure today.  He is agreeable.  He has been NPO.   Past Medical History:  Diagnosis Date   Anemia    History of kidney stones    Prostate cancer (Andre Glass)    UTI (urinary tract infection)     Past Surgical History:  Procedure Laterality Date   IR IMAGING GUIDED PORT INSERTION  01/31/2020   TRANSURETHRAL RESECTION OF PROSTATE N/A 11/08/2019   Procedure: TRANSURETHRAL RESECTION OF THE PROSTATE (TURP);  Surgeon: Andre Mallow, MD;  Location: [facility] ORS;  Service: Urology;  Laterality: N/A;    Allergies: Ibuprofen and Shrimp [shellfish allergy]  Medications: Prior to Admission medications   Medication Sig Start Date End Date Taking? Authorizing Provider  abiraterone acetate  (ZYTIGA) 250 MG tablet Take 4 tablets (1,000 mg total) by mouth daily. Take on an empty stomach 1 hour before or 2 hours after a meal Patient not taking: No sig reported 12/25/19   [name], MD  Ferrous Sulfate (IRON PO) Take 1 tablet by mouth daily.    [provider]  glycerin adult 2 g suppository Place 1 suppository rectally as needed for constipation. 09/11/20   Carlisle Cater, PA-C  HYDROmorphone (DILAUDID) 4 MG tablet Take 1 tablet (4 mg total) by mouth every 4 (four) hours as needed for severe pain. 09/27/20   [name], MD  lidocaine-prilocaine (EMLA) cream Apply 1 application topically as needed. 01/15/20   [name], MD  morphine (MS CONTIN) 60 MG 12 hr tablet Take 1 tablet (60 mg total) by mouth every 8 (eight) hours. 09/27/20   [name], MD  Multiple Vitamin (MULTIVITAMIN WITH MINERALS) TABS tablet Take 1 tablet by mouth daily.    [provider]  NONFORMULARY OR COMPOUNDED ITEM Apply 1-2 g topically 4 (four) times daily. 04/24/20   Marzetta Board, DPM  ondansetron (ZOFRAN) 4 MG tablet Take 1 tablet (4 mg total) by mouth 3 (three) times daily as needed for nausea or vomiting. 09/24/20   [name], MD  Plecanatide (TRULANCE) 3 MG TABS Take 3 mg by mouth daily. 08/08/20   Andre Del, DO  polyethylene glycol (MIRALAX) 17 g packet Take 17 g by mouth 2 (two) times daily. 01/19/20   Hongalgi, Andre Dickinson, MD  predniSONE (DELTASONE) 5 MG tablet Take 1 tablet (5 mg total) by mouth daily with breakfast. 10/25/19   Andre Glass,  Andre Dad, MD  prochlorperazine (COMPAZINE) 10 MG tablet Take 1 tablet (10 mg total) by mouth every 6 (six) hours as needed for nausea or vomiting. 09/24/20   [name], MD  senna (SENOKOT) 8.6 MG TABS tablet Take 2 tablets by mouth daily.    [provider]  tamsulosin (FLOMAX) 0.4 MG CAPS capsule TAKE 1 CAPSULE (0.4 MG TOTAL) BY MOUTH DAILY AS NEEDED (TO HELP WITH URINATION). 09/03/20   Andre Del, DO     Family  History  Problem Relation Age of Onset   Stomach cancer Mother    Pancreatic cancer Sister    Breast cancer Neg Hx    Prostate cancer Neg Hx    Colon cancer Neg Hx     Social History   Socioeconomic History   Marital status: Single    Spouse name: Not on file   Number of children: 0   Years of education: Not on file   Highest education level: Not on file  Occupational History   Not on file  Tobacco Use   Smoking status: Never   Smokeless tobacco: Never  Vaping Use   Vaping Use: Never used  Substance and Sexual Activity   Alcohol use: Never   Drug use: Never   Sexual activity: Not on file  Other Topics Concern   Not on file  Social History Narrative   Not on file   Social Determinants of Health   Financial Resource Strain: Not on file  Food Insecurity: Not on file  Transportation Needs: Not on file  Physical Activity: Not on file  Stress: Not on file  Social Connections: Not on file     Review of Systems: A 12 point ROS discussed and pertinent positives are indicated in the HPI above.  All other systems are negative.  Review of Systems  Constitutional:  Positive for appetite change and fatigue. Negative for fever.  Respiratory:  Positive for choking. Negative for cough, chest tightness and shortness of breath.   Cardiovascular:  Positive for leg swelling. Negative for chest pain.  Gastrointestinal:  Negative for abdominal pain.  Musculoskeletal:  Negative for back pain.  Psychiatric/Behavioral:  Negative for behavioral problems and confusion.    Vital Signs: BP (!) 170/82 (BP Location: Right Arm)   Pulse 69   Temp 97.8 F (36.6 C) (Oral)   Resp 15   Ht 5\' 8"  (1.727 m)   Wt 210 lb 12.2 oz (95.6 kg)   SpO2 99%   BMI 32.05 kg/m   Physical Exam Vitals and nursing note reviewed.  Constitutional:      General: He is not in acute distress.    Appearance: Normal appearance. He is not ill-appearing.  HENT:     Mouth/Throat:     Mouth: Mucous membranes  are moist.     Pharynx: Oropharynx is clear.  Cardiovascular:     Rate and Rhythm: Normal rate and regular rhythm.  Pulmonary:     Effort: Pulmonary effort is normal.     Breath sounds: Normal breath sounds.  Abdominal:     General: Abdomen is flat.     Palpations: Abdomen is soft.  Skin:    General: Skin is warm and dry.  Neurological:     General: No focal deficit present.     Mental Status: He is alert and oriented to person, place, and time. Mental status is at baseline.  Psychiatric:        Mood and Affect: Mood normal.  Behavior: Behavior normal.        Thought Content: Thought content normal.        Judgment: Judgment normal.     MD Evaluation Airway: WNL Heart: WNL Abdomen: WNL Chest/ Lungs: WNL ASA  Classification: 3 Mallampati/Airway Score: Two   Imaging: CT Renal Stone Study  Result Date: 09/28/2020 CLINICAL DATA:  Acute renal failure, right hip pain EXAM: CT ABDOMEN AND PELVIS WITHOUT CONTRAST TECHNIQUE: Multidetector CT imaging of the abdomen and pelvis was performed following the standard protocol without IV contrast. COMPARISON:  06/14/2020 FINDINGS: Lower chest: Visualized lung bases are clear. The visualized heart and pericardium are unremarkable. Hepatobiliary: No focal liver abnormality is seen. No gallstones, gallbladder wall thickening, or biliary dilatation. Pancreas: Unremarkable Spleen: Unremarkable Adrenals/Urinary Tract: Multiple lobulated mural masses are again identified within the base of the bladder which appear confluent with the dome of the prostate gland. There is, additionally, confluent soft tissue with the prostate gland resulting in thickening of the anterior wall of the rectum, best seen on axial image # 82/2. Altogether, these are in keeping with a locally invasive prostate malignancy with invasion of the base of the bladder as well as the anterior wall the rectum. This appears similar to that seen on prior examination though exact  measurement is difficult given the irregular contour of the mass and lack of contrast administration. There is resultant progressive, moderate bilateral hydronephrosis with obstruction of the ureterovesicular junctions bilaterally by the lobulated mass. Renal cortical thickness has been preserved. The kidneys are normal in size and position. No superimposed intrarenal masses or calcifications are seen. The adrenal glands are unremarkable. Stomach/Bowel: Aside from the eccentric mural mass involving the anterior wall of the rectum, the stomach, small bowel, and large bowel are unremarkable. No evidence of obstruction or focal inflammation. Appendix normal. No free intraperitoneal gas or fluid. Vascular/Lymphatic: There is progressive retroperitoneal and pelvic adenopathy. By example a new periaortic lymph node has developed at axial image # 46/2 measuring 12 mm x 22 mm. Pathologic adenopathy is now seen within the left periaortic, bilateral common iliac, external iliac, and perirectal lymph node groups. Presacral edema appears similar. Mild aortoiliac atherosclerotic calcification is present. No aortic aneurysm. Reproductive: As noted above, the prostate gland is confluent with multiple lobulated soft tissue mass is invading the base of the bladder and anterior wall of the rectum in keeping with a primary prostatic malignancy. Other: Increasing anasarca with subcutaneous edema involving the body wall. Musculoskeletal: Multiple sclerotic metastases are again seen throughout the visualized axial skeleton which appears slightly progressive since prior examination. By example a. sclerotic metastasis involving the L4 vertebral body has slightly increased in size, best appreciated on sagittal reformats. No pathologic fracture IMPRESSION: Locally invasive prostate malignancy with multiple lobulated masses involving the base of the bladder as well as the anterior wall of the rectum. Resultant obstruction of the  ureterovesicular junctions bilaterally with progressive, moderate bilateral hydronephrosis. Preserved renal parenchymal volume. Interval disease progression with development of extensive retroperitoneal and pelvic adenopathy as well as slight interval progression of widespread sclerotic osseous metastases. Electronically Signed   By: Fidela Salisbury MD   On: 09/28/2020 22:21   DG Hip Unilat With Pelvis 2-3 Views Right  Result Date: 09/28/2020 CLINICAL DATA:  Status post fall 3 days ago with right hip pain. History of prostate cancer. EXAM: DG HIP (WITH OR WITHOUT PELVIS) 2-3V RIGHT COMPARISON:  Abdomen and pelvis CT, dated June 14, 2020 FINDINGS: There is no evidence of an acute  hip fracture or dislocation. Degenerative changes are seen involving both hips in the form of joint space narrowing and acetabular sclerosis. Ill-defined sclerotic changes are seen involving the medial aspects of the bilateral inferior pubic rami and lateral aspect of the lower right iliac bone. Soft tissue structures are unremarkable. IMPRESSION: 1. No acute osseous abnormality. 2. Sclerotic changes within the pelvis, as described above, consistent with patient's known history of osseous metastasis. Electronically Signed   By: Virgina Norfolk M.D.   On: 09/28/2020 18:52   NM PET (F18-PYLARIFY) SKULL TO MID THIGH  Result Date: 09/19/2020 CLINICAL DATA:  Prostate cancer with bone metastasis. Prostate biopsied 03/09/2019. Chemotherapy in 06/24/2019. Radiation therapy in 2021. Recent PSA of 91.5. EXAM: NUCLEAR MEDICINE PET SKULL BASE TO THIGH TECHNIQUE: 9.6 mCi F18 Piflufolastat (Pylarify) was injected intravenously. Full-ring PET imaging was performed from the skull base to thigh after the radiotracer. CT data was obtained and used for attenuation correction and anatomic localization. COMPARISON:  Chest abdomen and pelvic CTs of 06/14/2020. FINDINGS: NECK Right low jugular/supraclavicular node measures 8 mm and a S.U.V. max of 10.0  on 61/4. Incidental CT finding: Bilateral carotid atherosclerosis. No other cervical adenopathy. CHEST No radiotracer accumulation within mediastinal or hilar lymph nodes. No tracer avid pulmonary nodules on the CT scan. Incidental CT finding: Right Port-A-Cath tip at low SVC. Aortic atherosclerosis. Right upper lobe 6 mm nodule on 30/8 is unchanged and below PET resolution. ABDOMEN/PELVIS The dominant pelvic mass, with extension into the urinary bladder is poorly evaluated secondary to physiologic bladder hypermetabolism. Measures on the order of 5.9 x 5.6 cm on 200/4. Corresponds to tracer affinity centered about the right side of the prostate including at a S.U.V. max of 15.4. Compare 5.3 x 5.5 cm on the prior exam (when remeasured). Tracer avid abdominal retroperitoneal nodes. Example left periaortic node of 1.5 cm and a S.U.V. max of 28.2 on 145/4. Compare 1.2 cm on 06/14/2020 (when remeasured). A node within the left common iliac station measures 1.1 cm and a S.U.V. max of 11.0 on 175/4. Compare 9 mm on the prior CT (when remeasured). Extensive hypermetabolic pelvic adenopathy. Left external iliac node measures 1.1 cm and a S.U.V. max of 29.6 on 195/4. Newly enlarged since the prior CT. Tracer avid nodes surrounding pelvic bowel loops. Example anterior right pelvic node of 1.1 cm and a S.U.V. max of 28.8 on 188/4, new. A right perirectal node measures 8 mm and a S.U.V. max of 6.4 on 204/4, likely new since the prior CT. Incidental CT finding: Abdominal aortic atherosclerosis. Moderate right-sided hydroureteronephrosis is followed to the level of the ureterovesicular junction and the locally advanced prostate primary, similar. Interval development of mild to moderate left-sided hydronephrosis and mild left hydroureter, also followed to the level of the ureterovesicular junction. Normal adrenal glands. Colonic stool burden suggests constipation. SKELETON Extensive tracer avid osseous metastasis. Eccentric left  coccygeal lesion measures a S.U.V. max of 19.2, including on 194/4. A left acetabular lesion measures a S.U.V. max of 23.7 on 193/4. L4 lesion measures a S.U.V. max of 39.5. IMPRESSION: 1. Locally advanced prostate primary with bladder extension. Given concurrent physiologic urinary tracer affinity, the primary is somewhat challenging to evaluate. Felt to be slightly enlarged compared to 06/14/2020. Persistent secondary right and new left sided hydroureteronephrosis. 2. Moderate abdominal and marked pelvic nodal metastasis, progressive since 06/14/2020. 3. Isolated right supraclavicular/low cervical tracer avid nodal metastasis. 4. Widespread osseous metastasis. 5. No change in 6 mm right upper lobe pulmonary nodule, below PET  resolution. Electronically Signed   By: Abigail Miyamoto M.D.   On: 09/19/2020 12:30    Labs:  CBC: Recent Labs    07/18/20 1143 09/17/20 1050 09/28/20 1757 09/29/20 0425  WBC 6.1 7.4 6.3 7.9  HGB 8.3* 8.2* 7.9* 8.7*  HCT 27.0* 26.4* 26.1* 28.8*  PLT 215 306 286 317    COAGS: Recent Labs    10/31/19 1139  INR 1.0    BMP: Recent Labs    10/31/19 1139 11/29/19 1306 01/12/20 0852 07/18/20 1143 09/17/20 1050 09/28/20 1757 09/29/20 0425  NA 142 142   < > 142 142 137 138  K 3.6 3.5   < > 3.8 4.1 4.5 4.3  CL 103 106   < > 106 106 100 103  CO2 32 30   < > 27 26 27 26   GLUCOSE 86 121*   < > 86 122* 117* 107*  BUN 15 16   < > 24* 24* 78* 78*  CALCIUM 9.2 10.1   < > 9.0 9.0 9.1 9.0  CREATININE 0.82 1.35*   < > 1.00 1.44* 4.40* 4.66*  GFRNONAA >60 53*   < > >60 52* 14* 13*  GFRAA >60 >60  --   --   --   --   --    < > = values in this interval not displayed.    LIVER FUNCTION TESTS: Recent Labs    06/18/20 1251 07/18/20 1143 09/17/20 1050 09/29/20 0425  BILITOT 0.3 <0.2* 0.2* 0.5  AST 16 19 15 20   ALT 10 12 7 11   ALKPHOS 75 79 93 81  PROT 8.0 7.5 7.5 8.0  ALBUMIN 2.7* 2.9* 2.6* 3.1*    TUMOR MARKERS: No results for input(s): AFPTM, CEA, CA199,  CHROMGRNA in the last 8760 hours.  Assessment and Plan: Obstructive uropathy in the setting of metastatic prostate cancer with new evidence of disease progression, bulky adenopathy. IR consulted for bilateral PCN placement at the request of Dr. Alinda Money.  Case reviewed and approved by Dr. Earleen Glass.  Mr. Milliron has been NPO today.  Lovenox held.  Plan to proceed with bilateral nephrostomy tube placement given acute renal injury, SCr 4.4.  Risks and benefits of bilateral PCN placement was discussed with the patient including, but not limited to, infection, bleeding, significant bleeding causing loss or decrease in renal function or damage to adjacent structures.   All of the patient's questions were answered, patient is agreeable to proceed.  Consent signed and in chart.  Thank you for this interesting consult.  I greatly enjoyed meeting SCOTTIE METAYER and look forward to participating in their care.  A copy of this report was sent to the requesting provider on this date.  Electronically Signed: Docia Barrier, PA 09/29/2020, 11:59 AM   I spent a total of 40 Minutes    in face to face in clinical consultation, greater than 50% of which was counseling/coordinating care for obstructive uropathy.

## 2020-09-29 NOTE — Procedures (Addendum)
Interventional Radiology Procedure Note  Procedure: Image guided bilateral PCN placement.  2 x 52F drains. .  Complications: None  EBL: None    Recommendations: - Routine PCN care,  record output - follow up Cx - routine wound care - OK to restart any [facility] tomorrow Monday  Signed,  Dulcy Fanny. Earleen Newport, DO

## 2020-09-29 NOTE — Consult Note (Addendum)
Urology Consult   Physician requesting consult: Dr. Darrick Meigs  Reason for consult: AKI with bilateral hydronephrosis  History of Present Illness: Andre Glass is a 70 y.o. with castrate resistant metastatic prostate cancer followed by Dr. [name] and Dr. [name].  He was diagnosed with Gleason 9 prostate cancer in December 2020 with evidence of de novo metastatic disease at that time.  He was treated with abiraterone/ADT initially and began docetaxel in October 2021.  He has now progressed following docetaxel with his most recent PSA of 91.  He is now getting ready to begin cabizitaxel in the near future.  He has also been treated with palliative radiation to the pelvis for local control of locally advanced disease.  He presented to the ED last night with complaints of lower extremity edema, weakness, and poor appetite.  His Cr was noted to have increased to 4.4 from a baseline of 1.4 about 2 weeks ago.    Past Medical History:  Diagnosis Date   Anemia    History of kidney stones    Prostate cancer ([place])    UTI (urinary tract infection)     Past Surgical History:  Procedure Laterality Date   IR IMAGING GUIDED PORT INSERTION  01/31/2020   TRANSURETHRAL RESECTION OF PROSTATE N/A 11/08/2019   Procedure: TRANSURETHRAL RESECTION OF THE PROSTATE (TURP);  Surgeon: [name], MD;  Location: [facility];  Service: Urology;  Laterality: N/A;    Medications:  Home meds:  Current Facility-Administered Medications on File Prior to Encounter  Medication Dose Route Frequency Provider Last Rate Last Admin   heparin lock flush 100 unit/mL  500 Units Intravenous Once Shadad, Mathis Dad, MD       sodium chloride flush (NS) 0.9 % injection 10 mL  10 mL Intravenous PRN [name], MD       Current Outpatient Medications on File Prior to Encounter  Medication Sig Dispense Refill   abiraterone acetate (ZYTIGA) 250 MG tablet Take 4 tablets (1,000 mg total) by mouth daily. Take on an empty stomach 1 hour before  or 2 hours after a meal (Patient not taking: No sig reported) 120 tablet 0   Ferrous Sulfate (IRON PO) Take 1 tablet by mouth daily.     glycerin adult 2 g suppository Place 1 suppository rectally as needed for constipation. 12 suppository 0   HYDROmorphone (DILAUDID) 4 MG tablet Take 1 tablet (4 mg total) by mouth every 4 (four) hours as needed for severe pain. 60 tablet 0   lidocaine-prilocaine (EMLA) cream Apply 1 application topically as needed. 30 g 0   morphine (MS CONTIN) 60 MG 12 hr tablet Take 1 tablet (60 mg total) by mouth every 8 (eight) hours. 90 tablet 0   Multiple Vitamin (MULTIVITAMIN WITH MINERALS) TABS tablet Take 1 tablet by mouth daily.     NONFORMULARY OR COMPOUNDED ITEM Apply 1-2 g topically 4 (four) times daily. 60 each 2   ondansetron (ZOFRAN) 4 MG tablet Take 1 tablet (4 mg total) by mouth 3 (three) times daily as needed for nausea or vomiting. 40 tablet 1   Plecanatide (TRULANCE) 3 MG TABS Take 3 mg by mouth daily. 30 tablet 2   polyethylene glycol (MIRALAX) 17 g packet Take 17 g by mouth 2 (two) times daily. 60 each 0   predniSONE (DELTASONE) 5 MG tablet Take 1 tablet (5 mg total) by mouth daily with breakfast. 90 tablet 3   prochlorperazine (COMPAZINE) 10 MG tablet Take 1 tablet (10  mg total) by mouth every 6 (six) hours as needed for nausea or vomiting. 30 tablet 0   senna (SENOKOT) 8.6 MG TABS tablet Take 2 tablets by mouth daily.     tamsulosin (FLOMAX) 0.4 MG CAPS capsule TAKE 1 CAPSULE (0.4 MG TOTAL) BY MOUTH DAILY AS NEEDED (TO HELP WITH URINATION). 30 capsule 0     Scheduled Meds:  enoxaparin (LOVENOX) injection  30 mg Subcutaneous Q24H   morphine  60 mg Oral Q8H   Continuous Infusions:  lactated ringers 100 mL/hr at 09/29/20 0030   PRN Meds:.Glycerin (Adult), HYDROmorphone (DILAUDID) injection, labetalol, ondansetron **OR** ondansetron (ZOFRAN) IV  Allergies:  Allergies  Allergen Reactions   Ibuprofen Anaphylaxis   Shrimp [Shellfish Allergy]  Anaphylaxis    Family History  Problem Relation Age of Onset   Stomach cancer Mother    Pancreatic cancer Sister    Breast cancer Neg Hx    Prostate cancer Neg Hx    Colon cancer Neg Hx     Social History:  reports that he has never smoked. He has never used smokeless tobacco. He reports that he does not drink alcohol and does not use drugs.  ROS: A complete review of systems was performed.  All systems are negative except for pertinent findings as noted.  Physical Exam:  Vital signs in last 24 hours: Temp:  [97.9 F (36.6 C)-98.6 F (37 C)] 98.6 F (37 C) (06/26 0428) Pulse Rate:  [67-96] 81 (06/26 0428) Resp:  [16-18] 17 (06/26 0428) BP: (180-222)/(88-109) 190/93 (06/26 0428) SpO2:  [94 %-100 %] 94 % (06/26 0428) Weight:  [95.6 kg] 95.6 kg (06/26 0006) Constitutional:  Alert and oriented, No acute distress Cardiovascular: Regular rate and rhythm, No JVD, bilateral lower extremity edema Respiratory: Normal respiratory effort, Lungs clear bilaterally GI: Abdomen is soft, nondistended with mild left abdominal tendernss Genitourinary: Moderate left CVAT. Generalized edema of genitalia. Lymphatic: No lymphadenopathy Neurologic: Grossly intact, no focal deficits Psychiatric: Normal mood and affect  Laboratory Data:  Recent Labs    09/28/20 1757 09/29/20 0425  WBC 6.3 7.9  HGB 7.9* 8.7*  HCT 26.1* 28.8*  PLT 286 317    Recent Labs    09/28/20 1757 09/29/20 0425  NA 137 138  K 4.5 4.3  CL 100 103  GLUCOSE 117* 107*  BUN 78* 78*  CALCIUM 9.1 9.0  CREATININE 4.40* 4.66*     Results for orders placed or performed during the hospital encounter of 09/28/20 (from the past 24 hour(s))  Basic metabolic panel     Status: Abnormal   Collection Time: 09/28/20  5:57 PM  Result Value Ref Range   Sodium 137 135 - 145 mmol/L   Potassium 4.5 3.5 - 5.1 mmol/L   Chloride 100 98 - 111 mmol/L   CO2 27 22 - 32 mmol/L   Glucose, Bld 117 (H) 70 - 99 mg/dL   BUN 78 (H) 8 - 23  mg/dL   Creatinine, Ser 4.40 (H) 0.61 - 1.24 mg/dL   Calcium 9.1 8.9 - 10.3 mg/dL   GFR, Estimated 14 (L) >60 mL/min   Anion gap 10 5 - 15  CBC with Differential     Status: Abnormal   Collection Time: 09/28/20  5:57 PM  Result Value Ref Range   WBC 6.3 4.0 - 10.5 K/uL   RBC 2.83 (L) 4.22 - 5.81 MIL/uL   Hemoglobin 7.9 (L) 13.0 - 17.0 g/dL   HCT 26.1 (L) 39.0 - 52.0 %   MCV 92.2  80.0 - 100.0 fL   MCH 27.9 26.0 - 34.0 pg   MCHC 30.3 30.0 - 36.0 g/dL   RDW 14.9 11.5 - 15.5 %   Platelets 286 150 - 400 K/uL   nRBC 0.0 0.0 - 0.2 %   Neutrophils Relative % 76 %   Neutro Abs 4.8 1.7 - 7.7 K/uL   Lymphocytes Relative 15 %   Lymphs Abs 0.9 0.7 - 4.0 K/uL   Monocytes Relative 7 %   Monocytes Absolute 0.4 0.1 - 1.0 K/uL   Eosinophils Relative 1 %   Eosinophils Absolute 0.1 0.0 - 0.5 K/uL   Basophils Relative 0 %   Basophils Absolute 0.0 0.0 - 0.1 K/uL   Immature Granulocytes 1 %   Abs Immature Granulocytes 0.03 0.00 - 0.07 K/uL  POC occult blood, ED     Status: Abnormal   Collection Time: 09/28/20  7:26 PM  Result Value Ref Range   Fecal Occult Bld POSITIVE (A) NEGATIVE  Resp Panel by RT-PCR (Flu A&B, Covid) Nasopharyngeal Swab     Status: None   Collection Time: 09/28/20  7:52 PM   Specimen: Nasopharyngeal Swab; Nasopharyngeal(NP) swabs in vial transport medium  Result Value Ref Range   SARS Coronavirus 2 by RT PCR NEGATIVE NEGATIVE   Influenza A by PCR NEGATIVE NEGATIVE   Influenza B by PCR NEGATIVE NEGATIVE  Comprehensive metabolic panel     Status: Abnormal   Collection Time: 09/29/20  4:25 AM  Result Value Ref Range   Sodium 138 135 - 145 mmol/L   Potassium 4.3 3.5 - 5.1 mmol/L   Chloride 103 98 - 111 mmol/L   CO2 26 22 - 32 mmol/L   Glucose, Bld 107 (H) 70 - 99 mg/dL   BUN 78 (H) 8 - 23 mg/dL   Creatinine, Ser 4.66 (H) 0.61 - 1.24 mg/dL   Calcium 9.0 8.9 - 10.3 mg/dL   Total Protein 8.0 6.5 - 8.1 g/dL   Albumin 3.1 (L) 3.5 - 5.0 g/dL   AST 20 15 - 41 U/L   ALT 11 0 -  44 U/L   Alkaline Phosphatase 81 38 - 126 U/L   Total Bilirubin 0.5 0.3 - 1.2 mg/dL   GFR, Estimated 13 (L) >60 mL/min   Anion gap 9 5 - 15  CBC     Status: Abnormal   Collection Time: 09/29/20  4:25 AM  Result Value Ref Range   WBC 7.9 4.0 - 10.5 K/uL   RBC 3.10 (L) 4.22 - 5.81 MIL/uL   Hemoglobin 8.7 (L) 13.0 - 17.0 g/dL   HCT 28.8 (L) 39.0 - 52.0 %   MCV 92.9 80.0 - 100.0 fL   MCH 28.1 26.0 - 34.0 pg   MCHC 30.2 30.0 - 36.0 g/dL   RDW 14.8 11.5 - 15.5 %   Platelets 317 150 - 400 K/uL   nRBC 0.0 0.0 - 0.2 %   Recent Results (from the past 240 hour(s))  Resp Panel by RT-PCR (Flu A&B, Covid) Nasopharyngeal Swab     Status: None   Collection Time: 09/28/20  7:52 PM   Specimen: Nasopharyngeal Swab; Nasopharyngeal(NP) swabs in vial transport medium  Result Value Ref Range Status   SARS Coronavirus 2 by RT PCR NEGATIVE NEGATIVE Final    Comment: (NOTE) SARS-CoV-2 target nucleic acids are NOT DETECTED.  The SARS-CoV-2 RNA is generally detectable in upper respiratory specimens during the acute phase of infection. The lowest concentration of SARS-CoV-2 viral copies this assay can detect  is 138 copies/mL. A negative result does not preclude SARS-Cov-2 infection and should not be used as the sole basis for treatment or other patient management decisions. A negative result may occur with  improper specimen collection/handling, submission of specimen other than nasopharyngeal swab, presence of viral mutation(s) within the areas targeted by this assay, and inadequate number of viral copies(<138 copies/mL). A negative result must be combined with clinical observations, patient history, and epidemiological information. The expected result is Negative.  Fact Sheet for Patients:  EntrepreneurPulse.com.au  Fact Sheet for Healthcare Providers:  IncredibleEmployment.be  This test is no t yet approved or cleared by the [place] FDA and  has been  authorized for detection and/or diagnosis of SARS-CoV-2 by FDA under an Emergency Use Authorization (EUA). This EUA will remain  in effect (meaning this test can be used) for the duration of the COVID-19 declaration under Section 564(b)(1) of the Act, 21 U.S.C.section 360bbb-3(b)(1), unless the authorization is terminated  or revoked sooner.       Influenza A by PCR NEGATIVE NEGATIVE Final   Influenza B by PCR NEGATIVE NEGATIVE Final    Comment: (NOTE) The Xpert Xpress SARS-CoV-2/FLU/RSV plus assay is intended as an aid in the diagnosis of influenza from Nasopharyngeal swab specimens and should not be used as a sole basis for treatment. Nasal washings and aspirates are unacceptable for Xpert Xpress SARS-CoV-2/FLU/RSV testing.  Fact Sheet for Patients: EntrepreneurPulse.com.au  Fact Sheet for Healthcare Providers: IncredibleEmployment.be  This test is not yet approved or cleared by the [place] FDA and has been authorized for detection and/or diagnosis of SARS-CoV-2 by FDA under an Emergency Use Authorization (EUA). This EUA will remain in effect (meaning this test can be used) for the duration of the COVID-19 declaration under Section 564(b)(1) of the Act, 21 U.S.C. section 360bbb-3(b)(1), unless the authorization is terminated or revoked.  Performed at [facility], [place] [address], [place], [place] [address]     Renal Function: Recent Labs    09/28/20 1757 09/29/20 0425  CREATININE 4.40* 4.66*   Estimated Creatinine Clearance: 16.5 mL/min (A) (by C-G formula based on SCr of 4.66 mg/dL (H)).  Radiologic Imaging: CT Renal Stone Study  Result Date: 09/28/2020 CLINICAL DATA:  Acute renal failure, right hip pain EXAM: CT ABDOMEN AND PELVIS WITHOUT CONTRAST TECHNIQUE: Multidetector CT imaging of the abdomen and pelvis was performed following the standard protocol without IV contrast. COMPARISON:  06/14/2020  FINDINGS: Lower chest: Visualized lung bases are clear. The visualized heart and pericardium are unremarkable. Hepatobiliary: No focal liver abnormality is seen. No gallstones, gallbladder wall thickening, or biliary dilatation. Pancreas: Unremarkable Spleen: Unremarkable Adrenals/Urinary Tract: Multiple lobulated mural masses are again identified within the base of the bladder which appear confluent with the dome of the prostate gland. There is, additionally, confluent soft tissue with the prostate gland resulting in thickening of the anterior wall of the rectum, best seen on axial image # 82/2. Altogether, these are in keeping with a locally invasive prostate malignancy with invasion of the base of the bladder as well as the anterior wall the rectum. This appears similar to that seen on prior examination though exact measurement is difficult given the irregular contour of the mass and lack of contrast administration. There is resultant progressive, moderate bilateral hydronephrosis with obstruction of the ureterovesicular junctions bilaterally by the lobulated mass. Renal cortical thickness has been preserved. The kidneys are normal in size and position. No superimposed intrarenal masses or calcifications are seen. The adrenal  glands are unremarkable. Stomach/Bowel: Aside from the eccentric mural mass involving the anterior wall of the rectum, the stomach, small bowel, and large bowel are unremarkable. No evidence of obstruction or focal inflammation. Appendix normal. No free intraperitoneal gas or fluid. Vascular/Lymphatic: There is progressive retroperitoneal and pelvic adenopathy. By example a new periaortic lymph node has developed at axial image # 46/2 measuring 12 mm x 22 mm. Pathologic adenopathy is now seen within the left periaortic, bilateral common iliac, external iliac, and perirectal lymph node groups. Presacral edema appears similar. Mild aortoiliac atherosclerotic calcification is present. No aortic  aneurysm. Reproductive: As noted above, the prostate gland is confluent with multiple lobulated soft tissue mass is invading the base of the bladder and anterior wall of the rectum in keeping with a primary prostatic malignancy. Other: Increasing anasarca with subcutaneous edema involving the body wall. Musculoskeletal: Multiple sclerotic metastases are again seen throughout the visualized axial skeleton which appears slightly progressive since prior examination. By example a. sclerotic metastasis involving the L4 vertebral body has slightly increased in size, best appreciated on sagittal reformats. No pathologic fracture IMPRESSION: Locally invasive prostate malignancy with multiple lobulated masses involving the base of the bladder as well as the anterior wall of the rectum. Resultant obstruction of the ureterovesicular junctions bilaterally with progressive, moderate bilateral hydronephrosis. Preserved renal parenchymal volume. Interval disease progression with development of extensive retroperitoneal and pelvic adenopathy as well as slight interval progression of widespread sclerotic osseous metastases. Electronically Signed   By: Fidela Salisbury MD   On: 09/28/2020 22:21   DG Hip Unilat With Pelvis 2-3 Views Right  Result Date: 09/28/2020 CLINICAL DATA:  Status post fall 3 days ago with right hip pain. History of prostate cancer. EXAM: DG HIP (WITH OR WITHOUT PELVIS) 2-3V RIGHT COMPARISON:  Abdomen and pelvis CT, dated June 14, 2020 FINDINGS: There is no evidence of an acute hip fracture or dislocation. Degenerative changes are seen involving both hips in the form of joint space narrowing and acetabular sclerosis. Ill-defined sclerotic changes are seen involving the medial aspects of the bilateral inferior pubic rami and lateral aspect of the lower right iliac bone. Soft tissue structures are unremarkable. IMPRESSION: 1. No acute osseous abnormality. 2. Sclerotic changes within the pelvis, as described  above, consistent with patient's known history of osseous metastasis. Electronically Signed   By: Virgina Norfolk M.D.   On: 09/28/2020 18:52    I independently reviewed the above imaging studies.  Impression/Recommendation 1) AKI due to obstructive uropathy related to progression of prostate cancer: On review of his CT scan with significant, bulky local progression of this prostate cancer, I think attempting retrograde placement of ureteral stents is highly likely to be futile.  I would recommend consulting Interventional Radiology for initial management with bilateral nephrostomy tube placement to reverse AKI.  Subsequent attempt at antegrade ureteral stent placement can then be made once AKI resolves.  Will make NPO in anticipation of IR consult and possible PCN placement later. May need to hold Lovenox for procedure but defer to IR.  Will notify Dr. [name] of admission. 2) Castrate resistant metastatic prostate cancer: Per Dr. [name].  Hopefully, he will have some tumor response to cabizitaxel that may help alleviate some of his ureteral obstruction although I imagine he will require long term, chronic management with nephrostomy drainage or ureteral stents if able to be placed antegrade at some point.  Dutch Gray 09/29/2020, 7:53 AM    Pryor Curia MD  CC: Dr. Darrick Meigs

## 2020-09-29 NOTE — Progress Notes (Signed)
[facility]  PROGRESS NOTE  Andre Glass:[id number] DOB: 15-Oct-1950 DOA: 09/28/2020 PCP: [name], DO   Brief HPI:   70 year old male with medical history of metastatic prostate cancer, locally invasive, anemia of chronic disease, history of kidney stones, recurrent UTI, recurrent fecal impaction with narcotics came to hospital with complaints of intractable pain in the pelvic area and back requiring more narcotics for pain control as well as constipation.  In the ED he was disimpacted.  Constipation is believed to be due to narcotics.  He was also found to have FOBT positive with hemoglobin less than 8.  As part of work-up he was found to have locally invasive cancer on the CT scan abdomen/pelvis with bilateral hydronephrosis.  Urology was consulted.  CT renal stone study shows locally invasive prostate cancer with multiple lobulated masses in the base of the bladder and anterior wall of the rectum.  This has led to a ureterovesicular junction obstruction with bilateral progressing moderate bilateral hydronephrosis.  Urology consulted and patient being admitted to the hospital for further evaluation and treatment  Subjective   Patient seen and examined, urology has seen the patient and recommended percutaneous nephrostomy tube placement for obstructive uropathy/bilateral hydronephrosis.  IR has been consulted.   Assessment/Plan:     Obstructive uropathy/bilateral hydronephrosis -CT abdomen/pelvis shows lobulated masses from prostate cancer obstructing the ureterovesical junction causing bilateral hydronephrosis -Urology consulted; recommend percutaneous nephrostomy tube placement; IR consulted -At some point he will need ureteral stent placement once hydronephrosis is decompressed -will follow urology as outpatient  Acute kidney injury on CKD stage III a -Baseline creatinine around 1.4 -He presented with creatinine of 4.40 -CT scan shows bilateral hydronephrosis -Will  likely improve after percutaneous nephrostomy tubes per IR  Drug-induced constipation -S/p disimpaction in the ED -Start Senokot-S , 2 tablets twice daily  Metastatic prostate cancer -Followed by oncology as outpatient -Castrate resistant metastatic prostate cancer -Currently on Cabizitaxel  Severe intractable pain -Continue Dilaudid 2 mg IV every 2 hours as needed for pain   Hypertension, uncontrolled -Blood pressure is elevated -Start amlodipine 10 mg daily -Continue labetalol 5 mg IV every 2 hours as needed   Scheduled medications:    enoxaparin (LOVENOX) injection  30 mg Subcutaneous Q24H   lidocaine       morphine  60 mg Oral Q8H         Data Reviewed:   CBG:  No results for input(s): GLUCAP in the last 168 hours.  SpO2: 100 %    Vitals:   09/29/20 0021 09/29/20 0428 09/29/20 0833 09/29/20 1259  BP: (!) 185/93 (!) 190/93 (!) 170/82 (!) 192/85  Pulse: 67 81 69 77  Resp: 17 17 15 16   Temp: 98 F (36.7 C) 98.6 F (37 C) 97.8 F (36.6 C)   TempSrc: Oral Oral Oral   SpO2: 100% 94% 99% 100%  Weight:      Height:         Intake/Output Summary (Last 24 hours) at 09/29/2020 1300 Last data filed at 09/29/2020 0600 Gross per 24 hour  Intake 1086.37 ml  Output --  Net 1086.37 ml    06/24 1901 - 06/26 0700 In: 1086.4 [I.V.:586.4] Out: -   Filed Weights   09/29/20 0006  Weight: 95.6 kg    CBC:  Recent Labs  Lab 09/28/20 1757 09/29/20 0425  WBC 6.3 7.9  HGB 7.9* 8.7*  HCT 26.1* 28.8*  PLT 286 317  MCV 92.2 92.9  MCH 27.9 28.1  MCHC  30.3 30.2  RDW 14.9 14.8  LYMPHSABS 0.9  --   MONOABS 0.4  --   EOSABS 0.1  --   BASOSABS 0.0  --     Complete metabolic panel:  Recent Labs  Lab 09/28/20 1757 09/29/20 0425  NA 137 138  K 4.5 4.3  CL 100 103  CO2 27 26  GLUCOSE 117* 107*  BUN 78* 78*  CREATININE 4.40* 4.66*  CALCIUM 9.1 9.0  AST  --  20  ALT  --  11  ALKPHOS  --  81  BILITOT  --  0.5  ALBUMIN  --  3.1*    No results for  input(s): LIPASE, AMYLASE in the last 168 hours.  Recent Labs  Lab 09/28/20 1952  SARSCOV2NAA NEGATIVE    ------------------------------------------------------------------------------------------------------------------ No results for input(s): CHOL, HDL, LDLCALC, TRIG, CHOLHDL, LDLDIRECT in the last 72 hours.  Lab Results  Component Value Date   HGBA1C 5.9 (H) 06/26/2019   ------------------------------------------------------------------------------------------------------------------ No results for input(s): TSH, T4TOTAL, T3FREE, THYROIDAB in the last 72 hours.  Invalid input(s): FREET3 ------------------------------------------------------------------------------------------------------------------ No results for input(s): VITAMINB12, FOLATE, FERRITIN, TIBC, IRON, RETICCTPCT in the last 72 hours.  Coagulation profile No results for input(s): INR, PROTIME in the last 168 hours. No results for input(s): DDIMER in the last 72 hours.  Cardiac Enzymes No results for input(s): CKTOTAL, CKMB, CKMBINDEX, TROPONINI in the last 168 hours.  ------------------------------------------------------------------------------------------------------------------ No results found for: BNP   Antibiotics: Anti-infectives (From admission, onward)    None        Radiology Reports  CT Renal Stone Study  Result Date: 09/28/2020 CLINICAL DATA:  Acute renal failure, right hip pain EXAM: CT ABDOMEN AND PELVIS WITHOUT CONTRAST TECHNIQUE: Multidetector CT imaging of the abdomen and pelvis was performed following the standard protocol without IV contrast. COMPARISON:  06/14/2020 FINDINGS: Lower chest: Visualized lung bases are clear. The visualized heart and pericardium are unremarkable. Hepatobiliary: No focal liver abnormality is seen. No gallstones, gallbladder wall thickening, or biliary dilatation. Pancreas: Unremarkable Spleen: Unremarkable Adrenals/Urinary Tract: Multiple lobulated mural  masses are again identified within the base of the bladder which appear confluent with the dome of the prostate gland. There is, additionally, confluent soft tissue with the prostate gland resulting in thickening of the anterior wall of the rectum, best seen on axial image # 82/2. Altogether, these are in keeping with a locally invasive prostate malignancy with invasion of the base of the bladder as well as the anterior wall the rectum. This appears similar to that seen on prior examination though exact measurement is difficult given the irregular contour of the mass and lack of contrast administration. There is resultant progressive, moderate bilateral hydronephrosis with obstruction of the ureterovesicular junctions bilaterally by the lobulated mass. Renal cortical thickness has been preserved. The kidneys are normal in size and position. No superimposed intrarenal masses or calcifications are seen. The adrenal glands are unremarkable. Stomach/Bowel: Aside from the eccentric mural mass involving the anterior wall of the rectum, the stomach, small bowel, and large bowel are unremarkable. No evidence of obstruction or focal inflammation. Appendix normal. No free intraperitoneal gas or fluid. Vascular/Lymphatic: There is progressive retroperitoneal and pelvic adenopathy. By example a new periaortic lymph node has developed at axial image # 46/2 measuring 12 mm x 22 mm. Pathologic adenopathy is now seen within the left periaortic, bilateral common iliac, external iliac, and perirectal lymph node groups. Presacral edema appears similar. Mild aortoiliac atherosclerotic calcification is present. No aortic aneurysm. Reproductive: As noted above,  the prostate gland is confluent with multiple lobulated soft tissue mass is invading the base of the bladder and anterior wall of the rectum in keeping with a primary prostatic malignancy. Other: Increasing anasarca with subcutaneous edema involving the body wall. Musculoskeletal:  Multiple sclerotic metastases are again seen throughout the visualized axial skeleton which appears slightly progressive since prior examination. By example a. sclerotic metastasis involving the L4 vertebral body has slightly increased in size, best appreciated on sagittal reformats. No pathologic fracture IMPRESSION: Locally invasive prostate malignancy with multiple lobulated masses involving the base of the bladder as well as the anterior wall of the rectum. Resultant obstruction of the ureterovesicular junctions bilaterally with progressive, moderate bilateral hydronephrosis. Preserved renal parenchymal volume. Interval disease progression with development of extensive retroperitoneal and pelvic adenopathy as well as slight interval progression of widespread sclerotic osseous metastases. Electronically Signed   By: Fidela Salisbury MD   On: 09/28/2020 22:21   DG Hip Unilat With Pelvis 2-3 Views Right  Result Date: 09/28/2020 CLINICAL DATA:  Status post fall 3 days ago with right hip pain. History of prostate cancer. EXAM: DG HIP (WITH OR WITHOUT PELVIS) 2-3V RIGHT COMPARISON:  Abdomen and pelvis CT, dated June 14, 2020 FINDINGS: There is no evidence of an acute hip fracture or dislocation. Degenerative changes are seen involving both hips in the form of joint space narrowing and acetabular sclerosis. Ill-defined sclerotic changes are seen involving the medial aspects of the bilateral inferior pubic rami and lateral aspect of the lower right iliac bone. Soft tissue structures are unremarkable. IMPRESSION: 1. No acute osseous abnormality. 2. Sclerotic changes within the pelvis, as described above, consistent with patient's known history of osseous metastasis. Electronically Signed   By: Virgina Norfolk M.D.   On: 09/28/2020 18:52      DVT prophylaxis: Lovenox  Code Status: Full code  Family Communication: No family at bedside   Consultants: Urology  Procedures:     Objective    Physical  Examination:   General-appears in no acute distress Heart-S1-S2, regular, no murmur auscultated Lungs-clear to auscultation bilaterally, no wheezing or crackles auscultated Abdomen-soft, nontender, no organomegaly Extremities-no edema in the lower extremities Neuro-alert, oriented x3, no focal deficit noted  Status is: Inpatient  Dispo: The patient is from: Home              Anticipated d/c is to: Home              Anticipated d/c date is: 10/01/2020              Patient currently not stable for discharge  Barrier to discharge-ongoing evaluation and management for bilateral hydronephrosis  COVID-19 Labs  No results for input(s): DDIMER, FERRITIN, LDH, CRP in the last 72 hours.  Lab Results  Component Value Date   [place] NEGATIVE 09/28/2020   [place] NEGATIVE 01/18/2020   [place] NEGATIVE 01/12/2020   [place] NEGATIVE 11/04/2019    Microbiology  Recent Results (from the past 240 hour(s))  Resp Panel by RT-PCR (Flu A&B, Covid) Nasopharyngeal Swab     Status: None   Collection Time: 09/28/20  7:52 PM   Specimen: Nasopharyngeal Swab; Nasopharyngeal(NP) swabs in vial transport medium  Result Value Ref Range Status   SARS Coronavirus 2 by RT PCR NEGATIVE NEGATIVE Final    Comment: (NOTE) SARS-CoV-2 target nucleic acids are NOT DETECTED.  The SARS-CoV-2 RNA is generally detectable in upper respiratory specimens during the acute phase of infection. The lowest concentration of SARS-CoV-2 viral copies  this assay can detect is 138 copies/mL. A negative result does not preclude SARS-Cov-2 infection and should not be used as the sole basis for treatment or other patient management decisions. A negative result may occur with  improper specimen collection/handling, submission of specimen other than nasopharyngeal swab, presence of viral mutation(s) within the areas targeted by this assay, and inadequate number of viral copies(<138 copies/mL). A negative result must  be combined with clinical observations, patient history, and epidemiological information. The expected result is Negative.  Fact Sheet for Patients:  EntrepreneurPulse.com.au  Fact Sheet for Healthcare Providers:  IncredibleEmployment.be  This test is no t yet approved or cleared by the [place] FDA and  has been authorized for detection and/or diagnosis of SARS-CoV-2 by FDA under an Emergency Use Authorization (EUA). This EUA will remain  in effect (meaning this test can be used) for the duration of the COVID-19 declaration under Section 564(b)(1) of the Act, 21 U.S.C.section 360bbb-3(b)(1), unless the authorization is terminated  or revoked sooner.       Influenza A by PCR NEGATIVE NEGATIVE Final   Influenza B by PCR NEGATIVE NEGATIVE Final    Comment: (NOTE) The Xpert Xpress SARS-CoV-2/FLU/RSV plus assay is intended as an aid in the diagnosis of influenza from Nasopharyngeal swab specimens and should not be used as a sole basis for treatment. Nasal washings and aspirates are unacceptable for Xpert Xpress SARS-CoV-2/FLU/RSV testing.  Fact Sheet for Patients: EntrepreneurPulse.com.au  Fact Sheet for Healthcare Providers: IncredibleEmployment.be  This test is not yet approved or cleared by the [place] FDA and has been authorized for detection and/or diagnosis of SARS-CoV-2 by FDA under an Emergency Use Authorization (EUA). This EUA will remain in effect (meaning this test can be used) for the duration of the COVID-19 declaration under Section 564(b)(1) of the Act, 21 U.S.C. section 360bbb-3(b)(1), unless the authorization is terminated or revoked.  Performed at [facility], [place] [address], [place], [place] [address]              Oswald Hillock   [facility] If 7PM-7AM, please contact night-coverage at [email or web address], Office  [phone]   09/29/2020,  1:00 PM  LOS: 1 day

## 2020-09-30 ENCOUNTER — Encounter (HOSPITAL_COMMUNITY): Payer: Self-pay | Admitting: Internal Medicine

## 2020-09-30 DIAGNOSIS — N179 Acute kidney failure, unspecified: Secondary | ICD-10-CM

## 2020-09-30 LAB — BASIC METABOLIC PANEL
Anion gap: 10 (ref 5–15)
BUN: 73 mg/dL — ABNORMAL HIGH (ref 8–23)
CO2: 27 mmol/L (ref 22–32)
Calcium: 9 mg/dL (ref 8.9–10.3)
Chloride: 103 mmol/L (ref 98–111)
Creatinine, Ser: 4.82 mg/dL — ABNORMAL HIGH (ref 0.61–1.24)
GFR, Estimated: 12 mL/min — ABNORMAL LOW (ref >60)
Glucose, Bld: 80 mg/dL (ref 70–99)
Potassium: 4.7 mmol/L (ref 3.5–5.1)
Sodium: 140 mmol/L (ref 135–145)

## 2020-09-30 LAB — CBC
HCT: 26.6 % — ABNORMAL LOW (ref 39.0–52.0)
Hemoglobin: 8.1 g/dL — ABNORMAL LOW (ref 13.0–17.0)
MCH: 28 pg (ref 26.0–34.0)
MCHC: 30.5 g/dL (ref 30.0–36.0)
MCV: 92 fL (ref 80.0–100.0)
Platelets: 260 10*3/uL (ref 150–400)
RBC: 2.89 MIL/uL — ABNORMAL LOW (ref 4.22–5.81)
RDW: 15.2 % (ref 11.5–15.5)
WBC: 7 10*3/uL (ref 4.0–10.5)
nRBC: 0 % (ref 0.0–0.2)

## 2020-09-30 LAB — HIV ANTIBODY (ROUTINE TESTING W REFLEX): HIV Screen 4th Generation wRfx: NONREACTIVE

## 2020-09-30 MED ORDER — CHLORHEXIDINE GLUCONATE CLOTH 2 % EX PADS
6.0000 | MEDICATED_PAD | Freq: Every day | CUTANEOUS | Status: DC
  Administered 2020-09-30 – 2020-10-05 (×6): 6 via TOPICAL

## 2020-09-30 NOTE — Progress Notes (Addendum)
[facility]  PROGRESS NOTE  Andre Glass KVQ:[id number] DOB: 12-01-1950 DOA: 09/28/2020 PCP: [name], DO   Brief HPI:   70 year old male with medical history of metastatic prostate cancer, locally invasive, anemia of chronic disease, history of kidney stones, recurrent UTI, recurrent fecal impaction with narcotics came to hospital with complaints of intractable pain in the pelvic area and back requiring more narcotics for pain control as well as constipation.  In the ED he was disimpacted.  Constipation is believed to be due to narcotics.  He was also found to have FOBT positive with hemoglobin less than 8.  As part of work-up he was found to have locally invasive cancer on the CT scan abdomen/pelvis with bilateral hydronephrosis.  Urology was consulted.  CT renal stone study shows locally invasive prostate cancer with multiple lobulated masses in the base of the bladder and anterior wall of the rectum.  This has led to a ureterovesicular junction obstruction with bilateral progressing moderate bilateral hydronephrosis.  Urology consulted and patient being admitted to the hospital for further evaluation and treatment  IR was consulted and patient underwent percutaneous nephrostomy tube placement for obstructive uropathy/bilateral hydronephrosis.   Subjective   Patient seen and examined, underwent bilateral percutaneous nephrostomy tube placement yesterday.   Assessment/Plan:     Obstructive uropathy/bilateral hydronephrosis -CT abdomen/pelvis shows lobulated masses from prostate cancer obstructing the ureterovesical junction causing bilateral hydronephrosis -Urology consulted; recommend percutaneous nephrostomy tube placement; IR consulted -At some point he will need ureteral stent placement once hydronephrosis is decompressed -will follow urology as outpatient  Acute kidney injury on CKD stage III a -Baseline creatinine around 1.4 -He presented with creatinine of 4.40; today  creatinine is 4.82 -CT scan shows bilateral hydronephrosis; s/p bilateral PCN per IR -Hopefully creatinine should improve next few days   Drug-induced constipation -S/p disimpaction in the ED -Start Senokot-S , 2 tablets twice daily  Metastatic prostate cancer -Followed by oncology as outpatient -Castrate resistant metastatic prostate cancer -Currently on Cabizitaxel  Severe intractable pain -Continue Dilaudid 2 mg IV every 2 hours as needed for pain   Hypertension, uncontrolled -Blood pressure was elevated -Started amlodipine 10 mg daily; blood pressure is stable -Continue labetalol 5 mg IV every 2 hours as needed   Scheduled medications:    amLODipine  10 mg Oral Daily   Chlorhexidine Gluconate Cloth  6 each Topical Daily   enoxaparin (LOVENOX) injection  30 mg Subcutaneous Q24H   morphine  60 mg Oral Q8H   predniSONE  5 mg Oral Q breakfast   senna-docusate  2 tablet Oral BID         Data Reviewed:   CBG:  No results for input(s): GLUCAP in the last 168 hours.  SpO2: 96 % O2 Flow Rate (L/min): 2 L/min    Vitals:   09/29/20 1400 09/29/20 2022 09/30/20 0439 09/30/20 1320  BP: (!) 156/78 (!) 182/79 (!) 157/75 132/66  Pulse: 65 78 72 86  Resp: 14 17 18 16   Temp: 98 F (36.7 C) 98.1 F (36.7 C) 98.3 F (36.8 C) 97.7 F (36.5 C)  TempSrc: Oral Oral Oral Oral  SpO2: 97% 100% 98% 96%  Weight:      Height:         Intake/Output Summary (Last 24 hours) at 09/30/2020 1637 Last data filed at 09/30/2020 1500 Gross per 24 hour  Intake 3067.95 ml  Output 8800 ml  Net -5732.05 ml    06/25 1901 - 06/27 0700 In: 2964.4 [I.V.:2464.4] Out:  7725 [Urine:7725]  Filed Weights   09/29/20 0006  Weight: 95.6 kg    CBC:  Recent Labs  Lab 09/28/20 1757 09/29/20 0425 09/30/20 0446  WBC 6.3 7.9 7.0  HGB 7.9* 8.7* 8.1*  HCT 26.1* 28.8* 26.6*  PLT 286 317 260  MCV 92.2 92.9 92.0  MCH 27.9 28.1 28.0  MCHC 30.3 30.2 30.5  RDW 14.9 14.8 15.2  LYMPHSABS 0.9   --   --   MONOABS 0.4  --   --   EOSABS 0.1  --   --   BASOSABS 0.0  --   --     Complete metabolic panel:  Recent Labs  Lab 09/28/20 1757 09/29/20 0425 09/30/20 0446  NA 137 138 140  K 4.5 4.3 4.7  CL 100 103 103  CO2 27 26 27   GLUCOSE 117* 107* 80  BUN 78* 78* 73*  CREATININE 4.40* 4.66* 4.82*  CALCIUM 9.1 9.0 9.0  AST  --  20  --   ALT  --  11  --   ALKPHOS  --  81  --   BILITOT  --  0.5  --   ALBUMIN  --  3.1*  --     No results for input(s): LIPASE, AMYLASE in the last 168 hours.  Recent Labs  Lab 09/28/20 1952  SARSCOV2NAA NEGATIVE    ------------------------------------------------------------------------------------------------------------------ No results for input(s): CHOL, HDL, LDLCALC, TRIG, CHOLHDL, LDLDIRECT in the last 72 hours.  Lab Results  Component Value Date   HGBA1C 5.9 (H) 06/26/2019   ------------------------------------------------------------------------------------------------------------------ No results for input(s): TSH, T4TOTAL, T3FREE, THYROIDAB in the last 72 hours.  Invalid input(s): FREET3 ------------------------------------------------------------------------------------------------------------------ No results for input(s): VITAMINB12, FOLATE, FERRITIN, TIBC, IRON, RETICCTPCT in the last 72 hours.  Coagulation profile No results for input(s): INR, PROTIME in the last 168 hours. No results for input(s): DDIMER in the last 72 hours.  Cardiac Enzymes No results for input(s): CKTOTAL, CKMB, CKMBINDEX, TROPONINI in the last 168 hours.  ------------------------------------------------------------------------------------------------------------------ No results found for: BNP   Antibiotics: Anti-infectives (From admission, onward)    None        Radiology Reports  CT Renal Stone Study  Result Date: 09/28/2020 CLINICAL DATA:  Acute renal failure, right hip pain EXAM: CT ABDOMEN AND PELVIS WITHOUT CONTRAST TECHNIQUE:  Multidetector CT imaging of the abdomen and pelvis was performed following the standard protocol without IV contrast. COMPARISON:  06/14/2020 FINDINGS: Lower chest: Visualized lung bases are clear. The visualized heart and pericardium are unremarkable. Hepatobiliary: No focal liver abnormality is seen. No gallstones, gallbladder wall thickening, or biliary dilatation. Pancreas: Unremarkable Spleen: Unremarkable Adrenals/Urinary Tract: Multiple lobulated mural masses are again identified within the base of the bladder which appear confluent with the dome of the prostate gland. There is, additionally, confluent soft tissue with the prostate gland resulting in thickening of the anterior wall of the rectum, best seen on axial image # 82/2. Altogether, these are in keeping with a locally invasive prostate malignancy with invasion of the base of the bladder as well as the anterior wall the rectum. This appears similar to that seen on prior examination though exact measurement is difficult given the irregular contour of the mass and lack of contrast administration. There is resultant progressive, moderate bilateral hydronephrosis with obstruction of the ureterovesicular junctions bilaterally by the lobulated mass. Renal cortical thickness has been preserved. The kidneys are normal in size and position. No superimposed intrarenal masses or calcifications are seen. The adrenal glands are unremarkable. Stomach/Bowel: Aside from  the eccentric mural mass involving the anterior wall of the rectum, the stomach, small bowel, and large bowel are unremarkable. No evidence of obstruction or focal inflammation. Appendix normal. No free intraperitoneal gas or fluid. Vascular/Lymphatic: There is progressive retroperitoneal and pelvic adenopathy. By example a new periaortic lymph node has developed at axial image # 46/2 measuring 12 mm x 22 mm. Pathologic adenopathy is now seen within the left periaortic, bilateral common iliac, external  iliac, and perirectal lymph node groups. Presacral edema appears similar. Mild aortoiliac atherosclerotic calcification is present. No aortic aneurysm. Reproductive: As noted above, the prostate gland is confluent with multiple lobulated soft tissue mass is invading the base of the bladder and anterior wall of the rectum in keeping with a primary prostatic malignancy. Other: Increasing anasarca with subcutaneous edema involving the body wall. Musculoskeletal: Multiple sclerotic metastases are again seen throughout the visualized axial skeleton which appears slightly progressive since prior examination. By example a. sclerotic metastasis involving the L4 vertebral body has slightly increased in size, best appreciated on sagittal reformats. No pathologic fracture IMPRESSION: Locally invasive prostate malignancy with multiple lobulated masses involving the base of the bladder as well as the anterior wall of the rectum. Resultant obstruction of the ureterovesicular junctions bilaterally with progressive, moderate bilateral hydronephrosis. Preserved renal parenchymal volume. Interval disease progression with development of extensive retroperitoneal and pelvic adenopathy as well as slight interval progression of widespread sclerotic osseous metastases. Electronically Signed   By: Fidela Salisbury MD   On: 09/28/2020 22:21   DG Hip Unilat With Pelvis 2-3 Views Right  Result Date: 09/28/2020 CLINICAL DATA:  Status post fall 3 days ago with right hip pain. History of prostate cancer. EXAM: DG HIP (WITH OR WITHOUT PELVIS) 2-3V RIGHT COMPARISON:  Abdomen and pelvis CT, dated June 14, 2020 FINDINGS: There is no evidence of an acute hip fracture or dislocation. Degenerative changes are seen involving both hips in the form of joint space narrowing and acetabular sclerosis. Ill-defined sclerotic changes are seen involving the medial aspects of the bilateral inferior pubic rami and lateral aspect of the lower right iliac bone.  Soft tissue structures are unremarkable. IMPRESSION: 1. No acute osseous abnormality. 2. Sclerotic changes within the pelvis, as described above, consistent with patient's known history of osseous metastasis. Electronically Signed   By: Virgina Norfolk M.D.   On: 09/28/2020 18:52   IR NEPHROSTOMY PLACEMENT LEFT  Result Date: 09/29/2020 INDICATION: 70 year old male with prostate carcinoma, hydronephrosis and acute renal failure referred for bilateral percutaneous nephrostomy. EXAM: IR NEPHROSTOMY PLACEMENT LEFT; IR NEPHROSTOMY PLACEMENT RIGHT COMPARISON:  CT 09/28/2020 MEDICATIONS: None ANESTHESIA/SEDATION: Fentanyl 50 mcg IV; Versed 1.0 mg IV Moderate Sedation Time:  15 minutes The patient was continuously monitored during the procedure by the interventional radiology nurse under my direct supervision. CONTRAST:  2mL OMNIPAQUE IOHEXOL 300 MG/ML SOLN - administered into the collecting system(s) FLUOROSCOPY TIME:  Fluoroscopy Time: 1 minutes 48 seconds (23 mGy). COMPLICATIONS: None PROCEDURE: Informed written consent was obtained from the patient after a thorough discussion of the procedural risks, benefits and alternatives. All questions were addressed. Maximal Sterile Barrier Technique was utilized including caps, mask, sterile gowns, sterile gloves, sterile drape, hand hygiene and skin antiseptic. A timeout was performed prior to the initiation of the procedure. Patient positioned prone position on the fluoroscopy table. Ultrasound survey of the bilateral flank was performed with images stored and sent to PACs. Left: The patient was then prepped and draped in the usual sterile fashion. 1% lidocaine was used  to anesthetize the skin and subcutaneous tissues for local anesthesia. A Chiba needle was then used to access a posterior inferior calyx of the right kidney with ultrasound guidance. With spontaneous urine returned through the needle, passage of an 018 micro wire into the collecting system was performed  under fluoroscopy. A small incision was made with an 11 blade scalpel, and the needle was removed from the wire. An Accustick system was then advanced over the wire into the collecting system under fluoroscopy. The metal stiffener and inner dilator were removed, and then a sample of fluid was aspirated through the 4 French outer sheath. Bentson wire was passed into the collecting system and the sheath removed. Ten French dilation of the soft tissues was performed. Using modified Seldinger technique, a 10 French pigtail catheter drain was placed over the Bentson wire. Wire and inner stiffener removed, and the pigtail was formed in the collecting system. Small amount of contrast confirmed position of the catheter. Right: 1% lidocaine was used to anesthetize the skin and subcutaneous tissues for local anesthesia. A Chiba needle was then used to access a posterior inferior calyx of the left kidney with ultrasound guidance. With spontaneous urine returned through the needle, passage of an 018 micro wire into the collecting system was performed under fluoroscopy. A small incision was made with an 11 blade scalpel, and the needle was removed from the wire. An Accustick system was then advanced over the wire into the collecting system under fluoroscopy. The metal stiffener and inner dilator were removed, and then a sample of fluid was aspirated through the 4 French outer sheath. Bentson wire was passed into the collecting system and the sheath removed. Ten French dilation of the soft tissues was performed. Using modified Seldinger technique, a 10 French pigtail catheter drain was placed over the Bentson wire. Wire and inner stiffener removed, and the pigtail was formed in the collecting system. Small amount of contrast confirmed position of the catheter. Patient tolerated the procedure well and remained hemodynamically stable throughout. No complications were encountered and no significant blood loss encountered IMPRESSION:  Status post image guided bilateral percutaneous nephrostomy Signed, Dulcy Fanny. Dellia Nims, RPVI [facility] Electronically Signed   By: Corrie Mckusick D.O.   On: 09/29/2020 14:45   IR NEPHROSTOMY PLACEMENT RIGHT  Result Date: 09/29/2020 INDICATION: 70 year old male with prostate carcinoma, hydronephrosis and acute renal failure referred for bilateral percutaneous nephrostomy. EXAM: IR NEPHROSTOMY PLACEMENT LEFT; IR NEPHROSTOMY PLACEMENT RIGHT COMPARISON:  CT 09/28/2020 MEDICATIONS: None ANESTHESIA/SEDATION: Fentanyl 50 mcg IV; Versed 1.0 mg IV Moderate Sedation Time:  15 minutes The patient was continuously monitored during the procedure by the interventional radiology nurse under my direct supervision. CONTRAST:  79mL OMNIPAQUE IOHEXOL 300 MG/ML SOLN - administered into the collecting system(s) FLUOROSCOPY TIME:  Fluoroscopy Time: 1 minutes 48 seconds (23 mGy). COMPLICATIONS: None PROCEDURE: Informed written consent was obtained from the patient after a thorough discussion of the procedural risks, benefits and alternatives. All questions were addressed. Maximal Sterile Barrier Technique was utilized including caps, mask, sterile gowns, sterile gloves, sterile drape, hand hygiene and skin antiseptic. A timeout was performed prior to the initiation of the procedure. Patient positioned prone position on the fluoroscopy table. Ultrasound survey of the bilateral flank was performed with images stored and sent to PACs. Left: The patient was then prepped and draped in the usual sterile fashion. 1% lidocaine was used to anesthetize the skin and subcutaneous tissues for local anesthesia. A Chiba needle  was then used to access a posterior inferior calyx of the right kidney with ultrasound guidance. With spontaneous urine returned through the needle, passage of an 018 micro wire into the collecting system was performed under fluoroscopy. A small incision was made  with an 11 blade scalpel, and the needle was removed from the wire. An Accustick system was then advanced over the wire into the collecting system under fluoroscopy. The metal stiffener and inner dilator were removed, and then a sample of fluid was aspirated through the 4 French outer sheath. Bentson wire was passed into the collecting system and the sheath removed. Ten French dilation of the soft tissues was performed. Using modified Seldinger technique, a 10 French pigtail catheter drain was placed over the Bentson wire. Wire and inner stiffener removed, and the pigtail was formed in the collecting system. Small amount of contrast confirmed position of the catheter. Right: 1% lidocaine was used to anesthetize the skin and subcutaneous tissues for local anesthesia. A Chiba needle was then used to access a posterior inferior calyx of the left kidney with ultrasound guidance. With spontaneous urine returned through the needle, passage of an 018 micro wire into the collecting system was performed under fluoroscopy. A small incision was made with an 11 blade scalpel, and the needle was removed from the wire. An Accustick system was then advanced over the wire into the collecting system under fluoroscopy. The metal stiffener and inner dilator were removed, and then a sample of fluid was aspirated through the 4 French outer sheath. Bentson wire was passed into the collecting system and the sheath removed. Ten French dilation of the soft tissues was performed. Using modified Seldinger technique, a 10 French pigtail catheter drain was placed over the Bentson wire. Wire and inner stiffener removed, and the pigtail was formed in the collecting system. Small amount of contrast confirmed position of the catheter. Patient tolerated the procedure well and remained hemodynamically stable throughout. No complications were encountered and no significant blood loss encountered IMPRESSION: Status post image guided bilateral  percutaneous nephrostomy Signed, Dulcy Fanny. Dellia Nims, RPVI [facility] Electronically Signed   By: Corrie Mckusick D.O.   On: 09/29/2020 14:45      DVT prophylaxis: Lovenox  Code Status: Full code  Family Communication: Discussed with family members at bedside  Consultants: Urology  Procedures:     Objective    Physical Examination:  General-appears in no acute distress Heart-S1-S2, regular, no murmur auscultated Lungs-clear to auscultation bilaterally, no wheezing or crackles auscultated Abdomen-soft, nontender, no organomegaly, bilateral PCN tubes in place Extremities-no edema in the lower extremities Neuro-alert, oriented x3, no focal deficit noted  Status is: Inpatient  Dispo: The patient is from: Home              Anticipated d/c is to: Home              Anticipated d/c date is: 10/01/2020              Patient currently not stable for discharge  Barrier to discharge-ongoing evaluation and management for bilateral hydronephrosis  COVID-19 Labs  No results for input(s): DDIMER, FERRITIN, LDH, CRP in the last 72 hours.  Lab Results  Component Value Date   [place] NEGATIVE 09/28/2020   [place] NEGATIVE 01/18/2020   [place] NEGATIVE 01/12/2020   [place] NEGATIVE 11/04/2019    Microbiology  Recent Results (from the past 240 hour(s))  Resp Panel by RT-PCR (Flu A&B, Covid) Nasopharyngeal Swab  Status: None   Collection Time: 09/28/20  7:52 PM   Specimen: Nasopharyngeal Swab; Nasopharyngeal(NP) swabs in vial transport medium  Result Value Ref Range Status   SARS Coronavirus 2 by RT PCR NEGATIVE NEGATIVE Final    Comment: (NOTE) SARS-CoV-2 target nucleic acids are NOT DETECTED.  The SARS-CoV-2 RNA is generally detectable in upper respiratory specimens during the acute phase of infection. The lowest concentration of SARS-CoV-2 viral copies this assay can detect is 138 copies/mL. A  negative result does not preclude SARS-Cov-2 infection and should not be used as the sole basis for treatment or other patient management decisions. A negative result may occur with  improper specimen collection/handling, submission of specimen other than nasopharyngeal swab, presence of viral mutation(s) within the areas targeted by this assay, and inadequate number of viral copies(<138 copies/mL). A negative result must be combined with clinical observations, patient history, and epidemiological information. The expected result is Negative.  Fact Sheet for Patients:  EntrepreneurPulse.com.au  Fact Sheet for Healthcare Providers:  IncredibleEmployment.be  This test is no t yet approved or cleared by the [place] FDA and  has been authorized for detection and/or diagnosis of SARS-CoV-2 by FDA under an Emergency Use Authorization (EUA). This EUA will remain  in effect (meaning this test can be used) for the duration of the COVID-19 declaration under Section 564(b)(1) of the Act, 21 U.S.C.section 360bbb-3(b)(1), unless the authorization is terminated  or revoked sooner.       Influenza A by PCR NEGATIVE NEGATIVE Final   Influenza B by PCR NEGATIVE NEGATIVE Final    Comment: (NOTE) The Xpert Xpress SARS-CoV-2/FLU/RSV plus assay is intended as an aid in the diagnosis of influenza from Nasopharyngeal swab specimens and should not be used as a sole basis for treatment. Nasal washings and aspirates are unacceptable for Xpert Xpress SARS-CoV-2/FLU/RSV testing.  Fact Sheet for Patients: EntrepreneurPulse.com.au  Fact Sheet for Healthcare Providers: IncredibleEmployment.be  This test is not yet approved or cleared by the [place] FDA and has been authorized for detection and/or diagnosis of SARS-CoV-2 by FDA under an Emergency Use Authorization (EUA). This EUA will remain in effect (meaning this test can  be used) for the duration of the COVID-19 declaration under Section 564(b)(1) of the Act, 21 U.S.C. section 360bbb-3(b)(1), unless the authorization is terminated or revoked.  Performed at [facility], [place] [address] [place], [place] [address]              Oswald Hillock   [facility] If 7PM-7AM, please contact night-coverage at [email or web address], Office  [phone]   09/30/2020, 4:37 PM  LOS: 2 days

## 2020-09-30 NOTE — Progress Notes (Signed)
Referring Physician(s): Borden,L  Supervising Physician: Markus Daft  Patient Status:  [facility] - In-pt  Chief Complaint: Metastatic prostate cancer, obstructive uropathy   Subjective: Patient currently without new complaints.  Family in room.    Allergies: Ibuprofen and Shrimp [shellfish allergy]  Medications: Prior to Admission medications   Medication Sig Start Date End Date Taking? Authorizing Provider  HYDROmorphone (DILAUDID) 4 MG tablet Take 1 tablet (4 mg total) by mouth every 4 (four) hours as needed for severe pain. 09/27/20  Yes [name], MD  lidocaine-prilocaine (EMLA) cream Apply 1 application topically as needed. Patient taking differently: Apply 1 application topically as needed (prior to port access). 01/15/20  Yes [name], MD  morphine (MS CONTIN) 60 MG 12 hr tablet Take 1 tablet (60 mg total) by mouth every 8 (eight) hours. 09/27/20  Yes [name], MD  Multiple Vitamin (MULTIVITAMIN WITH MINERALS) TABS tablet Take 1 tablet by mouth daily.   Yes [provider]  ondansetron (ZOFRAN) 4 MG tablet Take 1 tablet (4 mg total) by mouth 3 (three) times daily as needed for nausea or vomiting. 09/24/20  Yes Shadad, Mathis Dad, MD  Plecanatide (TRULANCE) 3 MG TABS Take 3 mg by mouth daily. 08/08/20  Yes [name], [name], DO  polyethylene glycol (MIRALAX) 17 g packet Take 17 g by mouth 2 (two) times daily. 01/19/20  Yes Hongalgi, Lenis Dickinson, MD  predniSONE (DELTASONE) 5 MG tablet Take 1 tablet (5 mg total) by mouth daily with breakfast. 10/25/19  Yes Shadad, Mathis Dad, MD  prochlorperazine (COMPAZINE) 10 MG tablet Take 1 tablet (10 mg total) by mouth every 6 (six) hours as needed for nausea or vomiting. 09/24/20  Yes [name], MD  senna (SENOKOT) 8.6 MG TABS tablet Take 2 tablets by mouth daily.   Yes [provider]  tamsulosin (FLOMAX) 0.4 MG CAPS capsule TAKE 1 CAPSULE (0.4 MG TOTAL) BY MOUTH DAILY AS NEEDED (TO HELP WITH URINATION). Patient  taking differently: Take 0.4 mg by mouth daily. 09/03/20  Yes [name], [name], DO  vitamin C (ASCORBIC ACID) 500 MG tablet Take 1,000 mg by mouth daily.   Yes [provider]  Vitamin E 268 MG (400 UNIT) CHEW Chew 1 tablet by mouth daily.   Yes [provider]  NONFORMULARY OR COMPOUNDED ITEM Apply 1-2 g topically 4 (four) times daily. 04/24/20   Marzetta Board, DPM     Vital Signs: BP 132/66 (BP Location: Left Arm)   Pulse 86   Temp 97.7 F (36.5 C) (Oral)   Resp 16   Ht 5\' 8"  (1.727 m)   Wt 210 lb 12.2 oz (95.6 kg)   SpO2 96%   BMI 32.05 kg/m   Physical Exam awake, answering questions okay,bilateral nephrostomies are intact, insertion sites not significantly tender, output from left 775 cc yellow urine, right 300 cc slightly blood-tinged urine  Imaging: CT Renal Stone Study  Result Date: 09/28/2020 CLINICAL DATA:  Acute renal failure, right hip pain EXAM: CT ABDOMEN AND PELVIS WITHOUT CONTRAST TECHNIQUE: Multidetector CT imaging of the abdomen and pelvis was performed following the standard protocol without IV contrast. COMPARISON:  06/14/2020 FINDINGS: Lower chest: Visualized lung bases are clear. The visualized heart and pericardium are unremarkable. Hepatobiliary: No focal liver abnormality is seen. No gallstones, gallbladder wall thickening, or biliary dilatation. Pancreas: Unremarkable Spleen: Unremarkable Adrenals/Urinary Tract: Multiple lobulated mural masses are again identified within the base of the bladder which appear confluent with the dome of  the prostate gland. There is, additionally, confluent soft tissue with the prostate gland resulting in thickening of the anterior wall of the rectum, best seen on axial image # 82/2. Altogether, these are in keeping with a locally invasive prostate malignancy with invasion of the base of the bladder as well as the anterior wall the rectum. This appears similar to that seen on prior examination though exact measurement  is difficult given the irregular contour of the mass and lack of contrast administration. There is resultant progressive, moderate bilateral hydronephrosis with obstruction of the ureterovesicular junctions bilaterally by the lobulated mass. Renal cortical thickness has been preserved. The kidneys are normal in size and position. No superimposed intrarenal masses or calcifications are seen. The adrenal glands are unremarkable. Stomach/Bowel: Aside from the eccentric mural mass involving the anterior wall of the rectum, the stomach, small bowel, and large bowel are unremarkable. No evidence of obstruction or focal inflammation. Appendix normal. No free intraperitoneal gas or fluid. Vascular/Lymphatic: There is progressive retroperitoneal and pelvic adenopathy. By example a new periaortic lymph node has developed at axial image # 46/2 measuring 12 mm x 22 mm. Pathologic adenopathy is now seen within the left periaortic, bilateral common iliac, external iliac, and perirectal lymph node groups. Presacral edema appears similar. Mild aortoiliac atherosclerotic calcification is present. No aortic aneurysm. Reproductive: As noted above, the prostate gland is confluent with multiple lobulated soft tissue mass is invading the base of the bladder and anterior wall of the rectum in keeping with a primary prostatic malignancy. Other: Increasing anasarca with subcutaneous edema involving the body wall. Musculoskeletal: Multiple sclerotic metastases are again seen throughout the visualized axial skeleton which appears slightly progressive since prior examination. By example a. sclerotic metastasis involving the L4 vertebral body has slightly increased in size, best appreciated on sagittal reformats. No pathologic fracture IMPRESSION: Locally invasive prostate malignancy with multiple lobulated masses involving the base of the bladder as well as the anterior wall of the rectum. Resultant obstruction of the ureterovesicular  junctions bilaterally with progressive, moderate bilateral hydronephrosis. Preserved renal parenchymal volume. Interval disease progression with development of extensive retroperitoneal and pelvic adenopathy as well as slight interval progression of widespread sclerotic osseous metastases. Electronically Signed   By: Fidela Salisbury MD   On: 09/28/2020 22:21   DG Hip Unilat With Pelvis 2-3 Views Right  Result Date: 09/28/2020 CLINICAL DATA:  Status post fall 3 days ago with right hip pain. History of prostate cancer. EXAM: DG HIP (WITH OR WITHOUT PELVIS) 2-3V RIGHT COMPARISON:  Abdomen and pelvis CT, dated June 14, 2020 FINDINGS: There is no evidence of an acute hip fracture or dislocation. Degenerative changes are seen involving both hips in the form of joint space narrowing and acetabular sclerosis. Ill-defined sclerotic changes are seen involving the medial aspects of the bilateral inferior pubic rami and lateral aspect of the lower right iliac bone. Soft tissue structures are unremarkable. IMPRESSION: 1. No acute osseous abnormality. 2. Sclerotic changes within the pelvis, as described above, consistent with patient's known history of osseous metastasis. Electronically Signed   By: Virgina Norfolk M.D.   On: 09/28/2020 18:52   IR NEPHROSTOMY PLACEMENT LEFT  Result Date: 09/29/2020 INDICATION: 70 year old male with prostate carcinoma, hydronephrosis and acute renal failure referred for bilateral percutaneous nephrostomy. EXAM: IR NEPHROSTOMY PLACEMENT LEFT; IR NEPHROSTOMY PLACEMENT RIGHT COMPARISON:  CT 09/28/2020 MEDICATIONS: None ANESTHESIA/SEDATION: Fentanyl 50 mcg IV; Versed 1.0 mg IV Moderate Sedation Time:  15 minutes The patient was continuously monitored during the procedure  by the interventional radiology nurse under my direct supervision. CONTRAST:  51mL OMNIPAQUE IOHEXOL 300 MG/ML SOLN - administered into the collecting system(s) FLUOROSCOPY TIME:  Fluoroscopy Time: 1 minutes 48 seconds (23  mGy). COMPLICATIONS: None PROCEDURE: Informed written consent was obtained from the patient after a thorough discussion of the procedural risks, benefits and alternatives. All questions were addressed. Maximal Sterile Barrier Technique was utilized including caps, mask, sterile gowns, sterile gloves, sterile drape, hand hygiene and skin antiseptic. A timeout was performed prior to the initiation of the procedure. Patient positioned prone position on the fluoroscopy table. Ultrasound survey of the bilateral flank was performed with images stored and sent to PACs. Left: The patient was then prepped and draped in the usual sterile fashion. 1% lidocaine was used to anesthetize the skin and subcutaneous tissues for local anesthesia. A Chiba needle was then used to access a posterior inferior calyx of the right kidney with ultrasound guidance. With spontaneous urine returned through the needle, passage of an 018 micro wire into the collecting system was performed under fluoroscopy. A small incision was made with an 11 blade scalpel, and the needle was removed from the wire. An Accustick system was then advanced over the wire into the collecting system under fluoroscopy. The metal stiffener and inner dilator were removed, and then a sample of fluid was aspirated through the 4 French outer sheath. Bentson wire was passed into the collecting system and the sheath removed. Ten French dilation of the soft tissues was performed. Using modified Seldinger technique, a 10 French pigtail catheter drain was placed over the Bentson wire. Wire and inner stiffener removed, and the pigtail was formed in the collecting system. Small amount of contrast confirmed position of the catheter. Right: 1% lidocaine was used to anesthetize the skin and subcutaneous tissues for local anesthesia. A Chiba needle was then used to access a posterior inferior calyx of the left kidney with ultrasound guidance. With spontaneous urine returned through the  needle, passage of an 018 micro wire into the collecting system was performed under fluoroscopy. A small incision was made with an 11 blade scalpel, and the needle was removed from the wire. An Accustick system was then advanced over the wire into the collecting system under fluoroscopy. The metal stiffener and inner dilator were removed, and then a sample of fluid was aspirated through the 4 French outer sheath. Bentson wire was passed into the collecting system and the sheath removed. Ten French dilation of the soft tissues was performed. Using modified Seldinger technique, a 10 French pigtail catheter drain was placed over the Bentson wire. Wire and inner stiffener removed, and the pigtail was formed in the collecting system. Small amount of contrast confirmed position of the catheter. Patient tolerated the procedure well and remained hemodynamically stable throughout. No complications were encountered and no significant blood loss encountered IMPRESSION: Status post image guided bilateral percutaneous nephrostomy Signed, Dulcy Fanny. Dellia Nims, RPVI [facility] Radiology Electronically Signed   By: Corrie Mckusick D.O.   On: 09/29/2020 14:45   IR NEPHROSTOMY PLACEMENT RIGHT  Result Date: 09/29/2020 INDICATION: 70 year old male with prostate carcinoma, hydronephrosis and acute renal failure referred for bilateral percutaneous nephrostomy. EXAM: IR NEPHROSTOMY PLACEMENT LEFT; IR NEPHROSTOMY PLACEMENT RIGHT COMPARISON:  CT 09/28/2020 MEDICATIONS: None ANESTHESIA/SEDATION: Fentanyl 50 mcg IV; Versed 1.0 mg IV Moderate Sedation Time:  15 minutes The patient was continuously monitored during the procedure by the interventional radiology nurse under my direct supervision. CONTRAST:  50mL OMNIPAQUE  IOHEXOL 300 MG/ML SOLN - administered into the collecting system(s) FLUOROSCOPY TIME:  Fluoroscopy Time: 1 minutes 48 seconds (23 mGy). COMPLICATIONS: None PROCEDURE: Informed  written consent was obtained from the patient after a thorough discussion of the procedural risks, benefits and alternatives. All questions were addressed. Maximal Sterile Barrier Technique was utilized including caps, mask, sterile gowns, sterile gloves, sterile drape, hand hygiene and skin antiseptic. A timeout was performed prior to the initiation of the procedure. Patient positioned prone position on the fluoroscopy table. Ultrasound survey of the bilateral flank was performed with images stored and sent to PACs. Left: The patient was then prepped and draped in the usual sterile fashion. 1% lidocaine was used to anesthetize the skin and subcutaneous tissues for local anesthesia. A Chiba needle was then used to access a posterior inferior calyx of the right kidney with ultrasound guidance. With spontaneous urine returned through the needle, passage of an 018 micro wire into the collecting system was performed under fluoroscopy. A small incision was made with an 11 blade scalpel, and the needle was removed from the wire. An Accustick system was then advanced over the wire into the collecting system under fluoroscopy. The metal stiffener and inner dilator were removed, and then a sample of fluid was aspirated through the 4 French outer sheath. Bentson wire was passed into the collecting system and the sheath removed. Ten French dilation of the soft tissues was performed. Using modified Seldinger technique, a 10 French pigtail catheter drain was placed over the Bentson wire. Wire and inner stiffener removed, and the pigtail was formed in the collecting system. Small amount of contrast confirmed position of the catheter. Right: 1% lidocaine was used to anesthetize the skin and subcutaneous tissues for local anesthesia. A Chiba needle was then used to access a posterior inferior calyx of the left kidney with ultrasound guidance. With spontaneous urine returned through the needle, passage of an 018 micro wire into the  collecting system was performed under fluoroscopy. A small incision was made with an 11 blade scalpel, and the needle was removed from the wire. An Accustick system was then advanced over the wire into the collecting system under fluoroscopy. The metal stiffener and inner dilator were removed, and then a sample of fluid was aspirated through the 4 French outer sheath. Bentson wire was passed into the collecting system and the sheath removed. Ten French dilation of the soft tissues was performed. Using modified Seldinger technique, a 10 French pigtail catheter drain was placed over the Bentson wire. Wire and inner stiffener removed, and the pigtail was formed in the collecting system. Small amount of contrast confirmed position of the catheter. Patient tolerated the procedure well and remained hemodynamically stable throughout. No complications were encountered and no significant blood loss encountered IMPRESSION: Status post image guided bilateral percutaneous nephrostomy Signed, Dulcy Fanny. Dellia Nims, RPVI [facility] Radiology Electronically Signed   By: Corrie Mckusick D.O.   On: 09/29/2020 14:45    Labs:  CBC: Recent Labs    09/17/20 1050 09/28/20 1757 09/29/20 0425 09/30/20 0446  WBC 7.4 6.3 7.9 7.0  HGB 8.2* 7.9* 8.7* 8.1*  HCT 26.4* 26.1* 28.8* 26.6*  PLT 306 286 317 260    COAGS: Recent Labs    10/31/19 1139  INR 1.0    BMP: Recent Labs    10/31/19 1139 11/29/19 1306 01/12/20 0852 09/17/20 1050 09/28/20 1757 09/29/20 0425 09/30/20 0446  NA 142 142   < > 142 137  138 140  K 3.6 3.5   < > 4.1 4.5 4.3 4.7  CL 103 106   < > 106 100 103 103  CO2 32 30   < > 26 27 26 27   GLUCOSE 86 121*   < > 122* 117* 107* 80  BUN 15 16   < > 24* 78* 78* 73*  CALCIUM 9.2 10.1   < > 9.0 9.1 9.0 9.0  CREATININE 0.82 1.35*   < > 1.44* 4.40* 4.66* 4.82*  GFRNONAA >60 53*   < > 52* 14* 13* 12*  GFRAA >60 >60  --   --   --   --   --    < > = values  in this interval not displayed.    LIVER FUNCTION TESTS: Recent Labs    06/18/20 1251 07/18/20 1143 09/17/20 1050 09/29/20 0425  BILITOT 0.3 <0.2* 0.2* 0.5  AST 16 19 15 20   ALT 10 12 7 11   ALKPHOS 75 79 93 81  PROT 8.0 7.5 7.5 8.0  ALBUMIN 2.7* 2.9* 2.6* 3.1*    Assessment and Plan: Patient with history of metastatic prostate cancer with obstructive uropathy/AKI; status post bilateral nephrostomies on 6/26; afebrile, WBC normal, hemoglobin 8.1 down from 8.7, creatinine 4.82 up from 4.66, urine culture pend; further plans as per urology-(subsequent attempt at antegrade ureteral stent placement can then be made once AKI resolves)   Electronically Signed: D. Rowe Robert, PA-C 09/30/2020, 1:28 PM   I spent a total of 15 minutes at the the patient's bedside AND on the patient's hospital floor or unit, greater than 50% of which was counseling/coordinating care for bilateral nephrostomies    [name], male   DOB: 21-Jul-1950, 70 y.o.   MRN: [id number]

## 2020-10-01 LAB — URINE CULTURE: Culture: NO GROWTH

## 2020-10-01 LAB — BASIC METABOLIC PANEL
Anion gap: 10 (ref 5–15)
BUN: 64 mg/dL — ABNORMAL HIGH (ref 8–23)
CO2: 29 mmol/L (ref 22–32)
Calcium: 8.6 mg/dL — ABNORMAL LOW (ref 8.9–10.3)
Chloride: 103 mmol/L (ref 98–111)
Creatinine, Ser: 4.33 mg/dL — ABNORMAL HIGH (ref 0.61–1.24)
GFR, Estimated: 14 mL/min — ABNORMAL LOW (ref >60)
Glucose, Bld: 94 mg/dL (ref 70–99)
Potassium: 3.8 mmol/L (ref 3.5–5.1)
Sodium: 142 mmol/L (ref 135–145)

## 2020-10-01 NOTE — Progress Notes (Signed)
Referring Physician(s): Borden,L  Supervising Physician: Sandi Mariscal  Patient Status:  [name] - In-pt  Chief Complaint: Metastatic prostate cancer, obstructive uropathy   Subjective: Patient without new complaints; denies fever, chills, worsening flank pain, nausea, vomiting   Allergies: Ibuprofen and Shrimp [shellfish allergy]  Medications: Prior to Admission medications   Medication Sig Start Date End Date Taking? Authorizing Provider  HYDROmorphone (DILAUDID) 4 MG tablet Take 1 tablet (4 mg total) by mouth every 4 (four) hours as needed for severe pain. 09/27/20  Yes [name], MD  lidocaine-prilocaine (EMLA) cream Apply 1 application topically as needed. Patient taking differently: Apply 1 application topically as needed (prior to port access). 01/15/20  Yes [name], MD  morphine (MS CONTIN) 60 MG 12 hr tablet Take 1 tablet (60 mg total) by mouth every 8 (eight) hours. 09/27/20  Yes [name], MD  Multiple Vitamin (MULTIVITAMIN WITH MINERALS) TABS tablet Take 1 tablet by mouth daily.   Yes [provider]  ondansetron (ZOFRAN) 4 MG tablet Take 1 tablet (4 mg total) by mouth 3 (three) times daily as needed for nausea or vomiting. 09/24/20  Yes Shadad, Mathis Dad, MD  Plecanatide (TRULANCE) 3 MG TABS Take 3 mg by mouth daily. 08/08/20  Yes [name], [name], DO  polyethylene glycol (MIRALAX) 17 g packet Take 17 g by mouth 2 (two) times daily. 01/19/20  Yes Hongalgi, Lenis Dickinson, MD  predniSONE (DELTASONE) 5 MG tablet Take 1 tablet (5 mg total) by mouth daily with breakfast. 10/25/19  Yes Shadad, Mathis Dad, MD  prochlorperazine (COMPAZINE) 10 MG tablet Take 1 tablet (10 mg total) by mouth every 6 (six) hours as needed for nausea or vomiting. 09/24/20  Yes [name], MD  senna (SENOKOT) 8.6 MG TABS tablet Take 2 tablets by mouth daily.   Yes [provider]  tamsulosin (FLOMAX) 0.4 MG CAPS capsule TAKE 1 CAPSULE (0.4 MG TOTAL) BY MOUTH DAILY AS NEEDED (TO  HELP WITH URINATION). Patient taking differently: Take 0.4 mg by mouth daily. 09/03/20  Yes [name], [name], DO  vitamin C (ASCORBIC ACID) 500 MG tablet Take 1,000 mg by mouth daily.   Yes [provider]  Vitamin E 268 MG (400 UNIT) CHEW Chew 1 tablet by mouth daily.   Yes [provider]  NONFORMULARY OR COMPOUNDED ITEM Apply 1-2 g topically 4 (four) times daily. 04/24/20   Marzetta Board, DPM     Vital Signs: BP (!) 152/73 (BP Location: Left Arm)   Pulse 75   Temp 98.2 F (36.8 C) (Oral)   Resp 17   Ht 5\' 8"  (1.727 m)   Wt 210 lb 12.2 oz (95.6 kg)   SpO2 98%   BMI 32.05 kg/m   Physical Exam awake, alert.  Bilateral percutaneous nephrostomies intact, dressings clean and dry, insertion sites not significantly tender, output from left 3.1 L yellow urine, right 1.3 L slightly blood-tinged urine, both drains irrigated without difficulty  Imaging: CT Renal Stone Study  Result Date: 09/28/2020 CLINICAL DATA:  Acute renal failure, right hip pain EXAM: CT ABDOMEN AND PELVIS WITHOUT CONTRAST TECHNIQUE: Multidetector CT imaging of the abdomen and pelvis was performed following the standard protocol without IV contrast. COMPARISON:  06/14/2020 FINDINGS: Lower chest: Visualized lung bases are clear. The visualized heart and pericardium are unremarkable. Hepatobiliary: No focal liver abnormality is seen. No gallstones, gallbladder wall thickening, or biliary dilatation. Pancreas: Unremarkable Spleen: Unremarkable Adrenals/Urinary Tract: Multiple lobulated mural masses are again identified within  the base of the bladder which appear confluent with the dome of the prostate gland. There is, additionally, confluent soft tissue with the prostate gland resulting in thickening of the anterior wall of the rectum, best seen on axial image # 82/2. Altogether, these are in keeping with a locally invasive prostate malignancy with invasion of the base of the bladder as well as the anterior wall  the rectum. This appears similar to that seen on prior examination though exact measurement is difficult given the irregular contour of the mass and lack of contrast administration. There is resultant progressive, moderate bilateral hydronephrosis with obstruction of the ureterovesicular junctions bilaterally by the lobulated mass. Renal cortical thickness has been preserved. The kidneys are normal in size and position. No superimposed intrarenal masses or calcifications are seen. The adrenal glands are unremarkable. Stomach/Bowel: Aside from the eccentric mural mass involving the anterior wall of the rectum, the stomach, small bowel, and large bowel are unremarkable. No evidence of obstruction or focal inflammation. Appendix normal. No free intraperitoneal gas or fluid. Vascular/Lymphatic: There is progressive retroperitoneal and pelvic adenopathy. By example a new periaortic lymph node has developed at axial image # 46/2 measuring 12 mm x 22 mm. Pathologic adenopathy is now seen within the left periaortic, bilateral common iliac, external iliac, and perirectal lymph node groups. Presacral edema appears similar. Mild aortoiliac atherosclerotic calcification is present. No aortic aneurysm. Reproductive: As noted above, the prostate gland is confluent with multiple lobulated soft tissue mass is invading the base of the bladder and anterior wall of the rectum in keeping with a primary prostatic malignancy. Other: Increasing anasarca with subcutaneous edema involving the body wall. Musculoskeletal: Multiple sclerotic metastases are again seen throughout the visualized axial skeleton which appears slightly progressive since prior examination. By example a. sclerotic metastasis involving the L4 vertebral body has slightly increased in size, best appreciated on sagittal reformats. No pathologic fracture IMPRESSION: Locally invasive prostate malignancy with multiple lobulated masses involving the base of the bladder as  well as the anterior wall of the rectum. Resultant obstruction of the ureterovesicular junctions bilaterally with progressive, moderate bilateral hydronephrosis. Preserved renal parenchymal volume. Interval disease progression with development of extensive retroperitoneal and pelvic adenopathy as well as slight interval progression of widespread sclerotic osseous metastases. Electronically Signed   By: Fidela Salisbury MD   On: 09/28/2020 22:21   DG Hip Unilat With Pelvis 2-3 Views Right  Result Date: 09/28/2020 CLINICAL DATA:  Status post fall 3 days ago with right hip pain. History of prostate cancer. EXAM: DG HIP (WITH OR WITHOUT PELVIS) 2-3V RIGHT COMPARISON:  Abdomen and pelvis CT, dated June 14, 2020 FINDINGS: There is no evidence of an acute hip fracture or dislocation. Degenerative changes are seen involving both hips in the form of joint space narrowing and acetabular sclerosis. Ill-defined sclerotic changes are seen involving the medial aspects of the bilateral inferior pubic rami and lateral aspect of the lower right iliac bone. Soft tissue structures are unremarkable. IMPRESSION: 1. No acute osseous abnormality. 2. Sclerotic changes within the pelvis, as described above, consistent with patient's known history of osseous metastasis. Electronically Signed   By: Virgina Norfolk M.D.   On: 09/28/2020 18:52   IR NEPHROSTOMY PLACEMENT LEFT  Result Date: 09/29/2020 INDICATION: 70 year old male with prostate carcinoma, hydronephrosis and acute renal failure referred for bilateral percutaneous nephrostomy. EXAM: IR NEPHROSTOMY PLACEMENT LEFT; IR NEPHROSTOMY PLACEMENT RIGHT COMPARISON:  CT 09/28/2020 MEDICATIONS: None ANESTHESIA/SEDATION: Fentanyl 50 mcg IV; Versed 1.0 mg IV Moderate Sedation  Time:  15 minutes The patient was continuously monitored during the procedure by the interventional radiology nurse under my direct supervision. CONTRAST:  51mL OMNIPAQUE IOHEXOL 300 MG/ML SOLN - administered into  the collecting system(s) FLUOROSCOPY TIME:  Fluoroscopy Time: 1 minutes 48 seconds (23 mGy). COMPLICATIONS: None PROCEDURE: Informed written consent was obtained from the patient after a thorough discussion of the procedural risks, benefits and alternatives. All questions were addressed. Maximal Sterile Barrier Technique was utilized including caps, mask, sterile gowns, sterile gloves, sterile drape, hand hygiene and skin antiseptic. A timeout was performed prior to the initiation of the procedure. Patient positioned prone position on the fluoroscopy table. Ultrasound survey of the bilateral flank was performed with images stored and sent to PACs. Left: The patient was then prepped and draped in the usual sterile fashion. 1% lidocaine was used to anesthetize the skin and subcutaneous tissues for local anesthesia. A Chiba needle was then used to access a posterior inferior calyx of the right kidney with ultrasound guidance. With spontaneous urine returned through the needle, passage of an 018 micro wire into the collecting system was performed under fluoroscopy. A small incision was made with an 11 blade scalpel, and the needle was removed from the wire. An Accustick system was then advanced over the wire into the collecting system under fluoroscopy. The metal stiffener and inner dilator were removed, and then a sample of fluid was aspirated through the 4 French outer sheath. Bentson wire was passed into the collecting system and the sheath removed. Ten French dilation of the soft tissues was performed. Using modified Seldinger technique, a 10 French pigtail catheter drain was placed over the Bentson wire. Wire and inner stiffener removed, and the pigtail was formed in the collecting system. Small amount of contrast confirmed position of the catheter. Right: 1% lidocaine was used to anesthetize the skin and subcutaneous tissues for local anesthesia. A Chiba needle was then used to access a posterior inferior calyx of  the left kidney with ultrasound guidance. With spontaneous urine returned through the needle, passage of an 018 micro wire into the collecting system was performed under fluoroscopy. A small incision was made with an 11 blade scalpel, and the needle was removed from the wire. An Accustick system was then advanced over the wire into the collecting system under fluoroscopy. The metal stiffener and inner dilator were removed, and then a sample of fluid was aspirated through the 4 French outer sheath. Bentson wire was passed into the collecting system and the sheath removed. Ten French dilation of the soft tissues was performed. Using modified Seldinger technique, a 10 French pigtail catheter drain was placed over the Bentson wire. Wire and inner stiffener removed, and the pigtail was formed in the collecting system. Small amount of contrast confirmed position of the catheter. Patient tolerated the procedure well and remained hemodynamically stable throughout. No complications were encountered and no significant blood loss encountered IMPRESSION: Status post image guided bilateral percutaneous nephrostomy Signed, Dulcy Fanny. Dellia Nims, RPVI [facility] Radiology Electronically Signed   By: Corrie Mckusick D.O.   On: 09/29/2020 14:45   IR NEPHROSTOMY PLACEMENT RIGHT  Result Date: 09/29/2020 INDICATION: 70 year old male with prostate carcinoma, hydronephrosis and acute renal failure referred for bilateral percutaneous nephrostomy. EXAM: IR NEPHROSTOMY PLACEMENT LEFT; IR NEPHROSTOMY PLACEMENT RIGHT COMPARISON:  CT 09/28/2020 MEDICATIONS: None ANESTHESIA/SEDATION: Fentanyl 50 mcg IV; Versed 1.0 mg IV Moderate Sedation Time:  15 minutes The patient was continuously monitored during the procedure by  the interventional radiology nurse under my direct supervision. CONTRAST:  56mL OMNIPAQUE IOHEXOL 300 MG/ML SOLN - administered into the collecting system(s) FLUOROSCOPY TIME:   Fluoroscopy Time: 1 minutes 48 seconds (23 mGy). COMPLICATIONS: None PROCEDURE: Informed written consent was obtained from the patient after a thorough discussion of the procedural risks, benefits and alternatives. All questions were addressed. Maximal Sterile Barrier Technique was utilized including caps, mask, sterile gowns, sterile gloves, sterile drape, hand hygiene and skin antiseptic. A timeout was performed prior to the initiation of the procedure. Patient positioned prone position on the fluoroscopy table. Ultrasound survey of the bilateral flank was performed with images stored and sent to PACs. Left: The patient was then prepped and draped in the usual sterile fashion. 1% lidocaine was used to anesthetize the skin and subcutaneous tissues for local anesthesia. A Chiba needle was then used to access a posterior inferior calyx of the right kidney with ultrasound guidance. With spontaneous urine returned through the needle, passage of an 018 micro wire into the collecting system was performed under fluoroscopy. A small incision was made with an 11 blade scalpel, and the needle was removed from the wire. An Accustick system was then advanced over the wire into the collecting system under fluoroscopy. The metal stiffener and inner dilator were removed, and then a sample of fluid was aspirated through the 4 French outer sheath. Bentson wire was passed into the collecting system and the sheath removed. Ten French dilation of the soft tissues was performed. Using modified Seldinger technique, a 10 French pigtail catheter drain was placed over the Bentson wire. Wire and inner stiffener removed, and the pigtail was formed in the collecting system. Small amount of contrast confirmed position of the catheter. Right: 1% lidocaine was used to anesthetize the skin and subcutaneous tissues for local anesthesia. A Chiba needle was then used to access a posterior inferior calyx of the left kidney with ultrasound guidance.  With spontaneous urine returned through the needle, passage of an 018 micro wire into the collecting system was performed under fluoroscopy. A small incision was made with an 11 blade scalpel, and the needle was removed from the wire. An Accustick system was then advanced over the wire into the collecting system under fluoroscopy. The metal stiffener and inner dilator were removed, and then a sample of fluid was aspirated through the 4 French outer sheath. Bentson wire was passed into the collecting system and the sheath removed. Ten French dilation of the soft tissues was performed. Using modified Seldinger technique, a 10 French pigtail catheter drain was placed over the Bentson wire. Wire and inner stiffener removed, and the pigtail was formed in the collecting system. Small amount of contrast confirmed position of the catheter. Patient tolerated the procedure well and remained hemodynamically stable throughout. No complications were encountered and no significant blood loss encountered IMPRESSION: Status post image guided bilateral percutaneous nephrostomy Signed, Dulcy Fanny. Dellia Nims, RPVI [facility] Radiology Electronically Signed   By: Corrie Mckusick D.O.   On: 09/29/2020 14:45    Labs:  CBC: Recent Labs    09/17/20 1050 09/28/20 1757 09/29/20 0425 09/30/20 0446  WBC 7.4 6.3 7.9 7.0  HGB 8.2* 7.9* 8.7* 8.1*  HCT 26.4* 26.1* 28.8* 26.6*  PLT 306 286 317 260    COAGS: Recent Labs    10/31/19 1139  INR 1.0    BMP: Recent Labs    10/31/19 1139 11/29/19 1306 01/12/20 0852 09/28/20 1757 09/29/20 0425 09/30/20 0446  10/01/20 0540  NA 142 142   < > 137 138 140 142  K 3.6 3.5   < > 4.5 4.3 4.7 3.8  CL 103 106   < > 100 103 103 103  CO2 32 30   < > 27 26 27 29   GLUCOSE 86 121*   < > 117* 107* 80 94  BUN 15 16   < > 78* 78* 73* 64*  CALCIUM 9.2 10.1   < > 9.1 9.0 9.0 8.6*  CREATININE 0.82 1.35*   < > 4.40* 4.66* 4.82* 4.33*   GFRNONAA >60 53*   < > 14* 13* 12* 14*  GFRAA >60 >60  --   --   --   --   --    < > = values in this interval not displayed.    LIVER FUNCTION TESTS: Recent Labs    06/18/20 1251 07/18/20 1143 09/17/20 1050 09/29/20 0425  BILITOT 0.3 <0.2* 0.2* 0.5  AST 16 19 15 20   ALT 10 12 7 11   ALKPHOS 75 79 93 81  PROT 8.0 7.5 7.5 8.0  ALBUMIN 2.7* 2.9* 2.6* 3.1*    Assessment and Plan: Patient with history of metastatic prostate cancer with obstructive uropathy/AKI; status post bilateral nephrostomies on 6/26; afebrile, creatinine 4.33 down from 4.82, urine culture negative, further plans as per urology-(subsequent attempt at antegrade ureteral stent placement can then be made once AKI resolves)  Electronically Signed: D. Rowe Robert, PA-C 10/01/2020, 11:19 AM   I spent a total of 15 Minutes at the the patient's bedside AND on the patient's hospital floor or unit, greater than 50% of which was counseling/coordinating care for bilateral nephrostomies    [name], male   DOB: 12-15-1950, 70 y.o.   MRN: [id number]

## 2020-10-01 NOTE — Progress Notes (Signed)
Labs drawn per order and per protocol. Tubed to lab. Neomia Dear, RN

## 2020-10-01 NOTE — Progress Notes (Signed)
[facility]  PROGRESS NOTE  Andre Glass VFI:[id number] DOB: 18-Nov-1950 DOA: 09/28/2020 PCP: [name], DO   Brief HPI:   70 year old male with medical history of metastatic prostate cancer, locally invasive, anemia of chronic disease, history of kidney stones, recurrent UTI, recurrent fecal impaction with narcotics came to hospital with complaints of intractable pain in the pelvic area and back requiring more narcotics for pain control as well as constipation.  In the ED he was disimpacted.  Constipation is believed to be due to narcotics.  He was also found to have FOBT positive with hemoglobin less than 8.  As part of work-up he was found to have locally invasive cancer on the CT scan abdomen/pelvis with bilateral hydronephrosis.  Urology was consulted.  CT renal stone study shows locally invasive prostate cancer with multiple lobulated masses in the base of the bladder and anterior wall of the rectum.  This has led to a ureterovesicular junction obstruction with bilateral progressing moderate bilateral hydronephrosis.  Urology consulted and patient being admitted to the hospital for further evaluation and treatment  IR was consulted and patient underwent percutaneous nephrostomy tube placement for obstructive uropathy/bilateral hydronephrosis.   Subjective   Patient seen and examined, denies any complaints.  Creatinine down to 4.32 today.  Nephrostomy tubes are draining well.   Assessment/Plan:     Obstructive uropathy/bilateral hydronephrosis -CT abdomen/pelvis shows lobulated masses from prostate cancer obstructing the ureterovesical junction causing bilateral hydronephrosis -Urology consulted; recommend percutaneous nephrostomy tube placement; IR consulted -Bilateral PCN were placed per IR -At some point he will need ureteral stent placement once hydronephrosis is decompressed and renal function improves -will follow urology as outpatient  Acute kidney injury on CKD stage  III a -Baseline creatinine around 1.4 -He presented with creatinine of 4.40; today creatinine is 4.33 -CT scan shows bilateral hydronephrosis; s/p bilateral PCN per IR -Hopefully creatinine should improve next few days   Drug-induced constipation -S/p disimpaction in the ED -Start Senokot-S , 2 tablets twice daily  Metastatic prostate cancer -Followed by oncology as outpatient -Castrate resistant metastatic prostate cancer -Currently on Cabizitaxel  Severe intractable pain -Continue Dilaudid 2 mg IV every 2 hours as needed for pain   Hypertension, uncontrolled -Blood pressure was elevated -Started amlodipine 10 mg daily; blood pressure is stable -Continue labetalol 5 mg IV every 2 hours as needed  Anemia of chronic disease -Hemoglobin at baseline of 8.1 -Follow CBC in a.m., transfuse PRBC for hemoglobin less than 7    Scheduled medications:    amLODipine  10 mg Oral Daily   Chlorhexidine Gluconate Cloth  6 each Topical Daily   enoxaparin (LOVENOX) injection  30 mg Subcutaneous Q24H   morphine  60 mg Oral Q8H   predniSONE  5 mg Oral Q breakfast   senna-docusate  2 tablet Oral BID         Data Reviewed:   CBG:  No results for input(s): GLUCAP in the last 168 hours.  SpO2: 98 % O2 Flow Rate (L/min): 2 L/min    Vitals:   09/30/20 0439 09/30/20 1320 09/30/20 2152 10/01/20 0529  BP: (!) 157/75 132/66 (!) 141/71 (!) 152/73  Pulse: 72 86 68 75  Resp: 18 16 17 17   Temp: 98.3 F (36.8 C) 97.7 F (36.5 C) 98.2 F (36.8 C) 98.2 F (36.8 C)  TempSrc: Oral Oral Oral Oral  SpO2: 98% 96% 93% 98%  Weight:      Height:         Intake/Output Summary (  Last 24 hours) at 10/01/2020 1357 Last data filed at 10/01/2020 1014 Gross per 24 hour  Intake 1129.88 ml  Output 3700 ml  Net -2570.12 ml    06/26 1901 - 06/28 0700 In: 8338 [P.O.:540; I.V.:2768] Out: 11300 [[id number]]  Filed Weights   09/29/20 0006  Weight: 95.6 kg    CBC:  Recent Labs  Lab  09/28/20 1757 09/29/20 0425 09/30/20 0446  WBC 6.3 7.9 7.0  HGB 7.9* 8.7* 8.1*  HCT 26.1* 28.8* 26.6*  PLT 286 317 260  MCV 92.2 92.9 92.0  MCH 27.9 28.1 28.0  MCHC 30.3 30.2 30.5  RDW 14.9 14.8 15.2  LYMPHSABS 0.9  --   --   MONOABS 0.4  --   --   EOSABS 0.1  --   --   BASOSABS 0.0  --   --     Complete metabolic panel:  Recent Labs  Lab 09/28/20 1757 09/29/20 0425 09/30/20 0446 10/01/20 0540  NA 137 138 140 142  K 4.5 4.3 4.7 3.8  CL 100 103 103 103  CO2 27 26 27 29   GLUCOSE 117* 107* 80 94  BUN 78* 78* 73* 64*  CREATININE 4.40* 4.66* 4.82* 4.33*  CALCIUM 9.1 9.0 9.0 8.6*  AST  --  20  --   --   ALT  --  11  --   --   ALKPHOS  --  81  --   --   BILITOT  --  0.5  --   --   ALBUMIN  --  3.1*  --   --     No results for input(s): LIPASE, AMYLASE in the last 168 hours.  Recent Labs  Lab 09/28/20 1952  SARSCOV2NAA NEGATIVE    ------------------------------------------------------------------------------------------------------------------ No results for input(s): CHOL, HDL, LDLCALC, TRIG, CHOLHDL, LDLDIRECT in the last 72 hours.  Lab Results  Component Value Date   HGBA1C 5.9 (H) 06/26/2019   ------------------------------------------------------------------------------------------------------------------ No results for input(s): TSH, T4TOTAL, T3FREE, THYROIDAB in the last 72 hours.  Invalid input(s): FREET3 ------------------------------------------------------------------------------------------------------------------ No results for input(s): VITAMINB12, FOLATE, FERRITIN, TIBC, IRON, RETICCTPCT in the last 72 hours.  Coagulation profile No results for input(s): INR, PROTIME in the last 168 hours. No results for input(s): DDIMER in the last 72 hours.  Cardiac Enzymes No results for input(s): CKTOTAL, CKMB, CKMBINDEX, TROPONINI in the last 168  hours.  ------------------------------------------------------------------------------------------------------------------ No results found for: BNP   Antibiotics: Anti-infectives (From admission, onward)    None        Radiology Reports  No results found.    DVT prophylaxis: Lovenox  Code Status: Full code  Family Communication: Discussed with family members at bedside  Consultants: Urology  Procedures:     Objective    Physical Examination:  General-appears in no acute distress Heart-S1-S2, regular, no murmur auscultated Lungs-clear to auscultation bilaterally, no wheezing or crackles auscultated Abdomen-soft, nontender, no organomegaly, bilateral PCN tubes in place Extremities-no edema in the lower extremities Neuro-alert, oriented x3, no focal deficit noted  Status is: Inpatient  Dispo: The patient is from: Home              Anticipated d/c is to: Home              Anticipated d/c date is: 10/02/2020              Patient currently not stable for discharge  Barrier to discharge-ongoing evaluation and management for bilateral hydronephrosis  COVID-19 Labs  No results for input(s): DDIMER, FERRITIN,  LDH, CRP in the last 72 hours.  Lab Results  Component Value Date   [place] NEGATIVE 09/28/2020   [place] NEGATIVE 01/18/2020   [place] NEGATIVE 01/12/2020   [place] NEGATIVE 11/04/2019    Microbiology  Recent Results (from the past 240 hour(s))  Resp Panel by RT-PCR (Flu A&B, Covid) Nasopharyngeal Swab     Status: None   Collection Time: 09/28/20  7:52 PM   Specimen: Nasopharyngeal Swab; Nasopharyngeal(NP) swabs in vial transport medium  Result Value Ref Range Status   SARS Coronavirus 2 by RT PCR NEGATIVE NEGATIVE Final    Comment: (NOTE) SARS-CoV-2 target nucleic acids are NOT DETECTED.  The SARS-CoV-2 RNA is generally detectable in upper respiratory specimens during the acute phase of infection. The lowest concentration  of SARS-CoV-2 viral copies this assay can detect is 138 copies/mL. A negative result does not preclude SARS-Cov-2 infection and should not be used as the sole basis for treatment or other patient management decisions. A negative result may occur with  improper specimen collection/handling, submission of specimen other than nasopharyngeal swab, presence of viral mutation(s) within the areas targeted by this assay, and inadequate number of viral copies(<138 copies/mL). A negative result must be combined with clinical observations, patient history, and epidemiological information. The expected result is Negative.  Fact Sheet for Patients:  EntrepreneurPulse.com.au  Fact Sheet for Healthcare Providers:  IncredibleEmployment.be  This test is no t yet approved or cleared by the [place] FDA and  has been authorized for detection and/or diagnosis of SARS-CoV-2 by FDA under an Emergency Use Authorization (EUA). This EUA will remain  in effect (meaning this test can be used) for the duration of the COVID-19 declaration under Section 564(b)(1) of the Act, 21 U.S.C.section 360bbb-3(b)(1), unless the authorization is terminated  or revoked sooner.       Influenza A by PCR NEGATIVE NEGATIVE Final   Influenza B by PCR NEGATIVE NEGATIVE Final    Comment: (NOTE) The Xpert Xpress SARS-CoV-2/FLU/RSV plus assay is intended as an aid in the diagnosis of influenza from Nasopharyngeal swab specimens and should not be used as a sole basis for treatment. Nasal washings and aspirates are unacceptable for Xpert Xpress SARS-CoV-2/FLU/RSV testing.  Fact Sheet for Patients: EntrepreneurPulse.com.au  Fact Sheet for Healthcare Providers: IncredibleEmployment.be  This test is not yet approved or cleared by the [place] FDA and has been authorized for detection and/or diagnosis of SARS-CoV-2 by FDA under an Emergency Use  Authorization (EUA). This EUA will remain in effect (meaning this test can be used) for the duration of the COVID-19 declaration under Section 564(b)(1) of the Act, 21 U.S.C. section 360bbb-3(b)(1), unless the authorization is terminated or revoked.  Performed at [facility], [place] [address] [place], [place] [address]   Urine culture     Status: None   Collection Time: 09/29/20 11:28 AM   Specimen: Urine, Clean Catch  Result Value Ref Range Status   Specimen Description   Final    URINE, CLEAN CATCH Performed at [facility], [place] [address], [place], [place] [address]    Special Requests   Final    NONE Performed at [facility], [place] [address] [place], [place] [address]    Culture   Final    NO GROWTH Performed at [facility], [place] [address], [place], [place] [address]    Report Status 10/01/2020 FINAL  Final             Marge Duncans  [facility] If 7PM-7AM, please contact night-coverage at [email or web address], Office  [phone]   10/01/2020, 1:57 PM  LOS: 3 days

## 2020-10-01 NOTE — Progress Notes (Signed)
Urology Inpatient Progress Report  Primary hypertension [I10] Prostate cancer (Andre Glass) [C61] Drug-induced constipation [K59.03] Obstructive uropathy [N13.9] AKI (acute kidney injury) ([place]) [N17.9]        Intv/Subj: No acute events overnight. Patient is without complaint. Great urine output from nephrostomy tubes.  Creatinine slowly improving  Principal Problem:   Obstructive uropathy Active Problems:   Metastatic castration-resistant adenocarcinoma of prostate (Andre Glass)   Drug induced constipation   Intractable pain   AKI (acute kidney injury) ([place])   Hydronephrosis  Current Facility-Administered Medications  Medication Dose Route Frequency Provider Last Rate Last Admin   amLODipine (NORVASC) tablet 10 mg  10 mg Oral Daily Oswald Hillock, MD   10 mg at 09/30/20 1019   Chlorhexidine Gluconate Cloth 2 % PADS 6 each  6 each Topical Daily Oswald Hillock, MD   6 each at 09/30/20 1019   enoxaparin (LOVENOX) injection 30 mg  30 mg Subcutaneous Q24H Garba, Mohammad L, MD   30 mg at 09/30/20 1019   Glycerin (Adult) 2.1 g suppository 1 suppository  1 suppository Rectal PRN Elwyn Reach, MD       HYDROmorphone (DILAUDID) injection 2 mg  2 mg Intravenous Q2H PRN Elwyn Reach, MD   2 mg at 09/30/20 2045   labetalol (NORMODYNE) injection 5 mg  5 mg Intravenous Q2H PRN Elwyn Reach, MD   5 mg at 09/29/20 0456   lactated ringers infusion   Intravenous Continuous Elwyn Reach, MD 100 mL/hr at 10/01/20 0644 New Bag at 10/01/20 0644   morphine (MS CONTIN) 12 hr tablet 60 mg  60 mg Oral Q8H Garba, Mohammad L, MD   60 mg at 10/01/20 0751   ondansetron (ZOFRAN) tablet 4 mg  4 mg Oral Q6H PRN Elwyn Reach, MD       Or   ondansetron (ZOFRAN) injection 4 mg  4 mg Intravenous Q6H PRN Elwyn Reach, MD   4 mg at 10/01/20 0816   predniSONE (DELTASONE) tablet 5 mg  5 mg Oral Q breakfast Oswald Hillock, MD   5 mg at 10/01/20 3235   senna-docusate (Senokot-S) tablet 2 tablet  2 tablet  Oral BID Oswald Hillock, MD   2 tablet at 09/30/20 2031   Facility-Administered Medications Ordered in Other Encounters  Medication Dose Route Frequency Provider Last Rate Last Admin   heparin lock flush 100 unit/mL  500 Units Intravenous Once Shadad, Firas N, MD       sodium chloride flush (NS) 0.9 % injection 10 mL  10 mL Intravenous PRN [name], MD         Objective: Vital: Vitals:   09/30/20 0439 09/30/20 1320 09/30/20 2152 10/01/20 0529  BP: (!) 157/75 132/66 (!) 141/71 (!) 152/73  Pulse: 72 86 68 75  Resp: 18 16 17 17   Temp: 98.3 F (36.8 C) 97.7 F (36.5 C) 98.2 F (36.8 C) 98.2 F (36.8 C)  TempSrc: Oral Oral Oral Oral  SpO2: 98% 96% 93% 98%  Weight:      Height:       I/Os: I/O last 3 completed shifts: In: 3308 [P.O.:540; I.V.:2768] Out: 11300 [Urine:11300]  Physical Exam:  General: Patient is in no apparent distress Lungs: Normal respiratory effort, chest expands symmetrically. Bilateral nephrostomy tubes with clear yellow urine Ext: lower extremities symmetric  Lab Results: Recent Labs    09/28/20 1757 09/29/20 0425 09/30/20 0446  WBC 6.3 7.9 7.0  HGB 7.9* 8.7* 8.1*  HCT 26.1*  28.8* 26.6*   Recent Labs    09/29/20 0425 09/30/20 0446 10/01/20 0540  NA 138 140 142  K 4.3 4.7 3.8  CL 103 103 103  CO2 26 27 29   GLUCOSE 107* 80 94  BUN 78* 73* 64*  CREATININE 4.66* 4.82* 4.33*  CALCIUM 9.0 9.0 8.6*   No results for input(s): LABPT, INR in the last 72 hours. No results for input(s): LABURIN in the last 72 hours. Results for orders placed or performed during the hospital encounter of 09/28/20  Resp Panel by RT-PCR (Flu A&B, Covid) Nasopharyngeal Swab     Status: None   Collection Time: 09/28/20  7:52 PM   Specimen: Nasopharyngeal Swab; Nasopharyngeal(NP) swabs in vial transport medium  Result Value Ref Range Status   SARS Coronavirus 2 by RT PCR NEGATIVE NEGATIVE Final    Comment: (NOTE) SARS-CoV-2 target nucleic acids are NOT  DETECTED.  The SARS-CoV-2 RNA is generally detectable in upper respiratory specimens during the acute phase of infection. The lowest concentration of SARS-CoV-2 viral copies this assay can detect is 138 copies/mL. A negative result does not preclude SARS-Cov-2 infection and should not be used as the sole basis for treatment or other patient management decisions. A negative result may occur with  improper specimen collection/handling, submission of specimen other than nasopharyngeal swab, presence of viral mutation(s) within the areas targeted by this assay, and inadequate number of viral copies(<138 copies/mL). A negative result must be combined with clinical observations, patient history, and epidemiological information. The expected result is Negative.  Fact Sheet for Patients:  EntrepreneurPulse.com.au  Fact Sheet for Healthcare Providers:  IncredibleEmployment.be  This test is no t yet approved or cleared by the [place] FDA and  has been authorized for detection and/or diagnosis of SARS-CoV-2 by FDA under an Emergency Use Authorization (EUA). This EUA will remain  in effect (meaning this test can be used) for the duration of the COVID-19 declaration under Section 564(b)(1) of the Act, 21 U.S.C.section 360bbb-3(b)(1), unless the authorization is terminated  or revoked sooner.       Influenza A by PCR NEGATIVE NEGATIVE Final   Influenza B by PCR NEGATIVE NEGATIVE Final    Comment: (NOTE) The Xpert Xpress SARS-CoV-2/FLU/RSV plus assay is intended as an aid in the diagnosis of influenza from Nasopharyngeal swab specimens and should not be used as a sole basis for treatment. Nasal washings and aspirates are unacceptable for Xpert Xpress SARS-CoV-2/FLU/RSV testing.  Fact Sheet for Patients: EntrepreneurPulse.com.au  Fact Sheet for Healthcare Providers: IncredibleEmployment.be  This test is not yet  approved or cleared by the [place] FDA and has been authorized for detection and/or diagnosis of SARS-CoV-2 by FDA under an Emergency Use Authorization (EUA). This EUA will remain in effect (meaning this test can be used) for the duration of the COVID-19 declaration under Section 564(b)(1) of the Act, 21 U.S.C. section 360bbb-3(b)(1), unless the authorization is terminated or revoked.  Performed at [facility], [place] [address] [place], [place] [address]     Studies/Results: IR NEPHROSTOMY PLACEMENT LEFT  Result Date: 09/29/2020 INDICATION: 70 year old male with prostate carcinoma, hydronephrosis and acute renal failure referred for bilateral percutaneous nephrostomy. EXAM: IR NEPHROSTOMY PLACEMENT LEFT; IR NEPHROSTOMY PLACEMENT RIGHT COMPARISON:  CT 09/28/2020 MEDICATIONS: None ANESTHESIA/SEDATION: Fentanyl 50 mcg IV; Versed 1.0 mg IV Moderate Sedation Time:  15 minutes The patient was continuously monitored during the procedure by the interventional radiology nurse under my direct supervision. CONTRAST:  25mL OMNIPAQUE IOHEXOL 300 MG/ML SOLN -  administered into the collecting system(s) FLUOROSCOPY TIME:  Fluoroscopy Time: 1 minutes 48 seconds (23 mGy). COMPLICATIONS: None PROCEDURE: Informed written consent was obtained from the patient after a thorough discussion of the procedural risks, benefits and alternatives. All questions were addressed. Maximal Sterile Barrier Technique was utilized including caps, mask, sterile gowns, sterile gloves, sterile drape, hand hygiene and skin antiseptic. A timeout was performed prior to the initiation of the procedure. Patient positioned prone position on the fluoroscopy table. Ultrasound survey of the bilateral flank was performed with images stored and sent to PACs. Left: The patient was then prepped and draped in the usual sterile fashion. 1% lidocaine was used to anesthetize the skin and subcutaneous tissues for local anesthesia. A  Chiba needle was then used to access a posterior inferior calyx of the right kidney with ultrasound guidance. With spontaneous urine returned through the needle, passage of an 018 micro wire into the collecting system was performed under fluoroscopy. A small incision was made with an 11 blade scalpel, and the needle was removed from the wire. An Accustick system was then advanced over the wire into the collecting system under fluoroscopy. The metal stiffener and inner dilator were removed, and then a sample of fluid was aspirated through the 4 French outer sheath. Bentson wire was passed into the collecting system and the sheath removed. Ten French dilation of the soft tissues was performed. Using modified Seldinger technique, a 10 French pigtail catheter drain was placed over the Bentson wire. Wire and inner stiffener removed, and the pigtail was formed in the collecting system. Small amount of contrast confirmed position of the catheter. Right: 1% lidocaine was used to anesthetize the skin and subcutaneous tissues for local anesthesia. A Chiba needle was then used to access a posterior inferior calyx of the left kidney with ultrasound guidance. With spontaneous urine returned through the needle, passage of an 018 micro wire into the collecting system was performed under fluoroscopy. A small incision was made with an 11 blade scalpel, and the needle was removed from the wire. An Accustick system was then advanced over the wire into the collecting system under fluoroscopy. The metal stiffener and inner dilator were removed, and then a sample of fluid was aspirated through the 4 French outer sheath. Bentson wire was passed into the collecting system and the sheath removed. Ten French dilation of the soft tissues was performed. Using modified Seldinger technique, a 10 French pigtail catheter drain was placed over the Bentson wire. Wire and inner stiffener removed, and the pigtail was formed in the collecting system.  Small amount of contrast confirmed position of the catheter. Patient tolerated the procedure well and remained hemodynamically stable throughout. No complications were encountered and no significant blood loss encountered IMPRESSION: Status post image guided bilateral percutaneous nephrostomy Signed, Dulcy Fanny. Dellia Nims, RPVI [facility] Radiology Electronically Signed   By: Corrie Mckusick D.O.   On: 09/29/2020 14:45   IR NEPHROSTOMY PLACEMENT RIGHT  Result Date: 09/29/2020 INDICATION: 70 year old male with prostate carcinoma, hydronephrosis and acute renal failure referred for bilateral percutaneous nephrostomy. EXAM: IR NEPHROSTOMY PLACEMENT LEFT; IR NEPHROSTOMY PLACEMENT RIGHT COMPARISON:  CT 09/28/2020 MEDICATIONS: None ANESTHESIA/SEDATION: Fentanyl 50 mcg IV; Versed 1.0 mg IV Moderate Sedation Time:  15 minutes The patient was continuously monitored during the procedure by the interventional radiology nurse under my direct supervision. CONTRAST:  104mL OMNIPAQUE IOHEXOL 300 MG/ML SOLN - administered into the collecting system(s) FLUOROSCOPY TIME:  Fluoroscopy Time: 1 minutes 48  seconds (23 mGy). COMPLICATIONS: None PROCEDURE: Informed written consent was obtained from the patient after a thorough discussion of the procedural risks, benefits and alternatives. All questions were addressed. Maximal Sterile Barrier Technique was utilized including caps, mask, sterile gowns, sterile gloves, sterile drape, hand hygiene and skin antiseptic. A timeout was performed prior to the initiation of the procedure. Patient positioned prone position on the fluoroscopy table. Ultrasound survey of the bilateral flank was performed with images stored and sent to PACs. Left: The patient was then prepped and draped in the usual sterile fashion. 1% lidocaine was used to anesthetize the skin and subcutaneous tissues for local anesthesia. A Chiba needle was then used to access a  posterior inferior calyx of the right kidney with ultrasound guidance. With spontaneous urine returned through the needle, passage of an 018 micro wire into the collecting system was performed under fluoroscopy. A small incision was made with an 11 blade scalpel, and the needle was removed from the wire. An Accustick system was then advanced over the wire into the collecting system under fluoroscopy. The metal stiffener and inner dilator were removed, and then a sample of fluid was aspirated through the 4 French outer sheath. Bentson wire was passed into the collecting system and the sheath removed. Ten French dilation of the soft tissues was performed. Using modified Seldinger technique, a 10 French pigtail catheter drain was placed over the Bentson wire. Wire and inner stiffener removed, and the pigtail was formed in the collecting system. Small amount of contrast confirmed position of the catheter. Right: 1% lidocaine was used to anesthetize the skin and subcutaneous tissues for local anesthesia. A Chiba needle was then used to access a posterior inferior calyx of the left kidney with ultrasound guidance. With spontaneous urine returned through the needle, passage of an 018 micro wire into the collecting system was performed under fluoroscopy. A small incision was made with an 11 blade scalpel, and the needle was removed from the wire. An Accustick system was then advanced over the wire into the collecting system under fluoroscopy. The metal stiffener and inner dilator were removed, and then a sample of fluid was aspirated through the 4 French outer sheath. Bentson wire was passed into the collecting system and the sheath removed. Ten French dilation of the soft tissues was performed. Using modified Seldinger technique, a 10 French pigtail catheter drain was placed over the Bentson wire. Wire and inner stiffener removed, and the pigtail was formed in the collecting system. Small amount of contrast confirmed  position of the catheter. Patient tolerated the procedure well and remained hemodynamically stable throughout. No complications were encountered and no significant blood loss encountered IMPRESSION: Status post image guided bilateral percutaneous nephrostomy Signed, Dulcy Fanny. Dellia Nims, RPVI [facility] Radiology Electronically Signed   By: Corrie Mckusick D.O.   On: 09/29/2020 14:45    Assessment: Bilateral hydronephrosis secondary to locally advanced malignancy Acute renal insufficiency Castrate resistant metastatic prostate cancer  Plan: Continue to monitor creatinine and continue nephrostomy tubes.  I would like to see the creatinine stabilized over the course of several days prior to considering antegrade ureteral stent placement.  This can be done outpatient.    Link Snuffer, MD Urology 10/01/2020, 9:39 AM

## 2020-10-02 DIAGNOSIS — N1339 Other hydronephrosis: Secondary | ICD-10-CM

## 2020-10-02 LAB — BASIC METABOLIC PANEL
Anion gap: 7 (ref 5–15)
BUN: 50 mg/dL — ABNORMAL HIGH (ref 8–23)
CO2: 31 mmol/L (ref 22–32)
Calcium: 8.9 mg/dL (ref 8.9–10.3)
Chloride: 107 mmol/L (ref 98–111)
Creatinine, Ser: 3.13 mg/dL — ABNORMAL HIGH (ref 0.61–1.24)
GFR, Estimated: 21 mL/min — ABNORMAL LOW (ref >60)
Glucose, Bld: 113 mg/dL — ABNORMAL HIGH (ref 70–99)
Potassium: 3.6 mmol/L (ref 3.5–5.1)
Sodium: 145 mmol/L (ref 135–145)

## 2020-10-02 LAB — MAGNESIUM: Magnesium: 2 mg/dL (ref 1.7–2.4)

## 2020-10-02 MED ORDER — POLYETHYLENE GLYCOL 3350 17 G PO PACK
17.0000 g | PACK | Freq: Two times a day (BID) | ORAL | Status: DC
  Administered 2020-10-02 – 2020-10-05 (×6): 17 g via ORAL
  Filled 2020-10-02 (×7): qty 1

## 2020-10-02 MED ORDER — BISACODYL 10 MG RE SUPP
10.0000 mg | Freq: Once | RECTAL | Status: DC
  Filled 2020-10-02 (×2): qty 1

## 2020-10-02 MED ORDER — POTASSIUM CHLORIDE CRYS ER 20 MEQ PO TBCR
20.0000 meq | EXTENDED_RELEASE_TABLET | Freq: Once | ORAL | Status: AC
  Administered 2020-10-02: 20 meq via ORAL
  Filled 2020-10-02: qty 1

## 2020-10-02 NOTE — TOC Initial Note (Addendum)
Transition of Care [facility]) - Initial/Assessment Note    Patient Details  Name: Andre Glass MRN: [id number] Date of Birth: 1951-02-15  [facility]) CM/SW Contact:    Lynnell Catalan, RN Phone Number: 10/02/2020, 12:28 PM  Clinical Narrative:                 Pt to dc home with new nephrostomy tubes. Nursing staff to do teaching with family on how to drain bag and change dressing. Choice offered for home health RN and [name] chosen. [facility] liaison contacted for referral. Pt states he has caregivers daily that stay several hours with him. He also states that his niece and nephew are living with him right now. He has a rollator and wheelchair at home currently. He is requesting a 3in1. 3in1 ordered from [facility]. Will need MD order for [facility] at dc.  Expected Discharge Plan: [place] Barriers to Discharge: No Barriers Identified   Patient Goals and CMS Choice Patient states their goals for this hospitalization and ongoing recovery are:: To go home CMS Medicare.gov Compare Post Acute Care list provided to:: Patient Choice offered to / list presented to : Patient  Expected Discharge Plan and Services Expected Discharge Plan: [place]   Discharge Planning Services: CM Consult Post Acute Care Choice: [place] arrangements for the past 2 months: [place]                 DME Arranged: 3-N-1 DME Agency: [employer] Date DME Agency Contacted: 10/02/20 Time DME Agency Contacted: 1225 Representative spoke with at DME Agency: Freda Munro HH Arranged: RN [place] Agency: [place] Date [place]: 10/02/20 Time [place]: 1200 Representative spoke with at [place]: [place] Arrangements/Services Living arrangements for the past 2 months: [place] Lives with:: Relatives Patient language and need for interpreter reviewed:: Yes Do you feel safe going back to the place where you  live?: Yes      Need for Family Participation in Patient Care: Yes (Comment) Care giver support system in place?: Yes (comment) Current home services: Homehealth aide (PCS through his Medicaid) Criminal Activity/Legal Involvement Pertinent to Current Situation/Hospitalization: No - Comment as needed  Activities of Daily Living Home Assistive Devices/Equipment: Walker (specify type) (rolling) ADL Screening (condition at time of admission) Patient's cognitive ability adequate to safely complete daily activities?: Yes Is the patient deaf or have difficulty hearing?: No Does the patient have difficulty seeing, even when wearing glasses/contacts?: Yes Does the patient have difficulty concentrating, remembering, or making decisions?: No Patient able to express need for assistance with ADLs?: Yes Does the patient have difficulty dressing or bathing?: Yes Independently performs ADLs?: No Communication: Independent Dressing (OT): Needs assistance Grooming: Needs assistance Feeding: Independent Bathing: Independent Toileting: Independent In/Out Bed: Independent Walks in Home: Independent with device (comment) (rolling walker) Does the patient have difficulty walking or climbing stairs?: Yes Weakness of Legs: Both Weakness of Arms/Hands: None  Permission Sought/Granted Permission sought to share information with : Chartered certified accountant granted to share information with : Yes, Verbal Permission Granted     Permission granted to share info w AGENCY: [name], [employer]        Emotional Assessment Appearance:: Appears stated age Attitude/Demeanor/Rapport: Gracious Affect (typically observed): Calm Orientation: : Oriented to Self, Oriented to Place, Oriented to  Time, Oriented to Situation Alcohol / Substance Use: Not Applicable Psych Involvement: No (comment)  Admission diagnosis:  Primary hypertension [I10] Prostate cancer ([place]) [C61] Drug-induced constipation  [K59.03] Obstructive uropathy [N13.9] AKI (acute kidney injury) ([place]) [N17.9] Patient Active Problem List   Diagnosis Date Noted   AKI (acute kidney injury) ([place]) 09/28/2020   Hydronephrosis 09/28/2020   Obstructive uropathy 09/28/2020   Intractable pain 01/18/2020   BPH (benign prostatic hyperplasia) 11/08/2019   Catheter-associated urinary tract infection ([place]) 09/14/2019   Drug induced constipation 07/18/2019   Goals of care, counseling/discussion 06/26/2019   Metastatic castration-resistant adenocarcinoma of prostate ([place]) 04/19/2019   PCP:  [name], DO Pharmacy:   [id number] - [address], [place] [address] Phone: [phone] Fax: [phone]  [place] [place] [address] Phone: [phone] Fax: [phone]     Social Determinants of Health (SDOH) Interventions    Readmission Risk Interventions Readmission Risk Prevention Plan 10/02/2020  Transportation Screening Complete  PCP or Specialist Appt within 5-7 Days Complete  Home Care Screening Complete  Medication Review (RN CM) Complete  Some recent data might be hidden

## 2020-10-02 NOTE — Progress Notes (Signed)
[facility]                                                                              Patient Demographics  Mainor Hellmann, is a 70 y.o. male, DOB - 1951/04/04, CHE:[id number]  Admit date - 09/28/2020   Admitting Physician Elwyn Reach, MD  Outpatient Primary MD for the patient is [name], DO  Outpatient specialists:   LOS - 4  days   Medical records reviewed and are as summarized below:    Chief Complaint  Patient presents with   Leg Swelling   Leg Pain   Constipation       Brief summary   70 year old male with medical history of metastatic prostate cancer, locally invasive, anemia of chronic disease, history of kidney stones, recurrent UTI, recurrent fecal impaction with narcotics came to hospital with complaints of intractable pain in the pelvic area and back requiring more narcotics for pain control as well as constipation.  In the ED he was disimpacted.  Constipation is believed to be due to narcotics.  He was also found to have FOBT positive with hemoglobin less than 8.  As part of work-up he was found to have locally invasive cancer on the CT scan abdomen/pelvis with bilateral hydronephrosis.  Urology was consulted.   CT renal stone study shows locally invasive prostate cancer with multiple lobulated masses in the base of the bladder and anterior wall of the rectum.  This has led to a ureterovesicular junction obstruction with bilateral progressing moderate bilateral hydronephrosis.  Urology consulted and patient being admitted to the hospital for further evaluation and treatment   IR was consulted and patient underwent percutaneous nephrostomy tube placement for obstructive uropathy/bilateral hydronephrosis.   Assessment & Plan    Principal Problem:   Obstructive uropathy with bilateral hydronephrosis -CTAP showed lobulated masses from prostate CA obstructing the ureterovesical junction causing bilateral hydronephrosis  -Urology consulted,  status post percutaneous nephrostomy tube placement by IR -Per urology will need ureteral stent placement once hydronephrosis is decompressed and renal function has improved -Creatinine improving   Active Problems: Acute kidney injury on CKD stage IIIa  -Baseline creatinine around 1.4, presented with creatinine of 4.4 likely due to #1 -Creatinine now improving, 3.13 today  Constipation -Likely due to opioid/pain management, status post disimpaction in ED -Placed on MiraLAX twice daily, Dulcolax, Senokot  -If no improvement, will add Movantik in a.m.    Metastatic castration-resistant adenocarcinoma of prostate ([place]), intractable pain -Castrate resistant, currently on cabizitaxel, outpatient follow-up by oncology -Continue MS Contin, added bowel regimen  Hypertension, uncontrolled -Continue amlodipine 10 mg daily  Anemia of chronic disease, malignancy -At baseline  ~8, hemoglobin at baseline 8.1  Obesity Estimated body mass index is 32.05 kg/m as calculated from the following:   Height as of this encounter: 5\' 8"  (1.727 m).   Weight as of this encounter: 95.6 kg.  Code Status: Full CODE STATUS DVT Prophylaxis:  enoxaparin (LOVENOX) injection 30 mg Start: 09/29/20 1000   Level of Care: Level of care: Med-Surg Family Communication: Discussed all imaging results, lab results, explained to the patient   Disposition Plan:  Status is: Inpatient  Remains inpatient appropriate because:Inpatient level of care appropriate due to severity of illness  Dispo: The patient is from: Home              Anticipated d/c is to: Home              Patient currently is not medically stable to d/c.,  Awaiting for creatinine to stabilize   Difficult to place patient No      Time Spent in minutes   35 minutes  Procedures:  Bilateral PCN placement  Consultants:   Urology Interventional radiology  Antimicrobials:   Anti-infectives (From admission, onward)    None           Medications  Scheduled Meds:  amLODipine  10 mg Oral Daily   bisacodyl  10 mg Rectal Once   Chlorhexidine Gluconate Cloth  6 each Topical Daily   enoxaparin (LOVENOX) injection  30 mg Subcutaneous Q24H   morphine  60 mg Oral Q8H   polyethylene glycol  17 g Oral BID   predniSONE  5 mg Oral Q breakfast   senna-docusate  2 tablet Oral BID   Continuous Infusions:  lactated ringers 100 mL/hr at 10/01/20 1616   PRN Meds:.Glycerin (Adult), HYDROmorphone (DILAUDID) injection, labetalol, ondansetron **OR** ondansetron (ZOFRAN) IV      Subjective:   Nickalaus Crooke was seen and examined today.  States both hands were hurting, no acute other complaints.  Patient denies dizziness, chest pain, shortness of breath, abdominal pain, N/V/D/C, new weakness, numbess, tingling. No acute events overnight.    Objective:   Vitals:   10/01/20 0529 10/01/20 1523 10/01/20 2048 10/02/20 0503  BP: (!) 152/73 (!) 144/68 (!) 146/75 (!) 147/67  Pulse: 75 81 70 64  Resp: 17 15 16 18   Temp: 98.2 F (36.8 C) 98.2 F (36.8 C) 98 F (36.7 C) 98.4 F (36.9 C)  TempSrc: Oral Oral Oral Oral  SpO2: 98% 96% 97% 99%  Weight:      Height:        Intake/Output Summary (Last 24 hours) at 10/02/2020 1546 Last data filed at 10/02/2020 1000 Gross per 24 hour  Intake 2519.99 ml  Output 4050 ml  Net -1530.01 ml     Wt Readings from Last 3 Encounters:  09/29/20 95.6 kg  09/24/20 95.3 kg  09/11/20 91.6 kg     Exam General: Alert and oriented x 3, NAD Cardiovascular: S1 S2 auscultated, no murmurs, RRR Respiratory: Clear to auscultation bilaterally, no wheezing, rales or rhonchi Gastrointestinal: Soft, nontender, nondistended, + bowel sounds Ext: no pedal edema bilaterally Neuro: no new deficits GU: Bilateral nephrostomies Skin: No rashes Psych: Normal affect and demeanor, alert and oriented x3    Data Reviewed:  I have personally reviewed following labs and imaging studies  Micro  Results Recent Results (from the past 240 hour(s))  Resp Panel by RT-PCR (Flu A&B, Covid) Nasopharyngeal Swab     Status: None   Collection Time: 09/28/20  7:52 PM   Specimen: Nasopharyngeal Swab; Nasopharyngeal(NP) swabs in vial transport medium  Result Value Ref Range Status   SARS Coronavirus 2 by RT PCR NEGATIVE NEGATIVE Final    Comment: (NOTE) SARS-CoV-2 target nucleic acids are NOT DETECTED.  The SARS-CoV-2 RNA is generally detectable in upper respiratory specimens during the acute phase of infection. The lowest concentration of SARS-CoV-2 viral copies this assay can detect is 138 copies/mL. A negative result does not preclude SARS-Cov-2 infection and should not be used as the  sole basis for treatment or other patient management decisions. A negative result may occur with  improper specimen collection/handling, submission of specimen other than nasopharyngeal swab, presence of viral mutation(s) within the areas targeted by this assay, and inadequate number of viral copies(<138 copies/mL). A negative result must be combined with clinical observations, patient history, and epidemiological information. The expected result is Negative.  Fact Sheet for Patients:  EntrepreneurPulse.com.au  Fact Sheet for Healthcare Providers:  IncredibleEmployment.be  This test is no t yet approved or cleared by the [place] FDA and  has been authorized for detection and/or diagnosis of SARS-CoV-2 by FDA under an Emergency Use Authorization (EUA). This EUA will remain  in effect (meaning this test can be used) for the duration of the COVID-19 declaration under Section 564(b)(1) of the Act, 21 U.S.C.section 360bbb-3(b)(1), unless the authorization is terminated  or revoked sooner.       Influenza A by PCR NEGATIVE NEGATIVE Final   Influenza B by PCR NEGATIVE NEGATIVE Final    Comment: (NOTE) The Xpert Xpress SARS-CoV-2/FLU/RSV plus assay is intended as  an aid in the diagnosis of influenza from Nasopharyngeal swab specimens and should not be used as a sole basis for treatment. Nasal washings and aspirates are unacceptable for Xpert Xpress SARS-CoV-2/FLU/RSV testing.  Fact Sheet for Patients: EntrepreneurPulse.com.au  Fact Sheet for Healthcare Providers: IncredibleEmployment.be  This test is not yet approved or cleared by the [place] FDA and has been authorized for detection and/or diagnosis of SARS-CoV-2 by FDA under an Emergency Use Authorization (EUA). This EUA will remain in effect (meaning this test can be used) for the duration of the COVID-19 declaration under Section 564(b)(1) of the Act, 21 U.S.C. section 360bbb-3(b)(1), unless the authorization is terminated or revoked.  Performed at [facility], [place] [address], [place], [place] [address]   Urine culture     Status: None   Collection Time: 09/29/20 11:28 AM   Specimen: Urine, Clean Catch  Result Value Ref Range Status   Specimen Description   Final    URINE, CLEAN CATCH Performed at [facility], [employer] [place] [address] [place], [place] [address]    Special Requests   Final    NONE Performed at [facility], [place] [address] [place], [place] [address]    Culture   Final    NO GROWTH Performed at [facility], [place] [address] [place], [place] [address]    Report Status 10/01/2020 FINAL  Final    Radiology Reports CT Renal Stone Study  Result Date: 09/28/2020 CLINICAL DATA:  Acute renal failure, right hip pain EXAM: CT ABDOMEN AND PELVIS WITHOUT CONTRAST TECHNIQUE: Multidetector CT imaging of the abdomen and pelvis was performed following the standard protocol without IV contrast. COMPARISON:  06/14/2020 FINDINGS: Lower chest: Visualized lung bases are clear. The visualized heart and pericardium are unremarkable. Hepatobiliary: No focal liver abnormality is seen. No  gallstones, gallbladder wall thickening, or biliary dilatation. Pancreas: Unremarkable Spleen: Unremarkable Adrenals/Urinary Tract: Multiple lobulated mural masses are again identified within the base of the bladder which appear confluent with the dome of the prostate gland. There is, additionally, confluent soft tissue with the prostate gland resulting in thickening of the anterior wall of the rectum, best seen on axial image # 82/2. Altogether, these are in keeping with a locally invasive prostate malignancy with invasion of the base of the bladder as well as the anterior wall the rectum. This appears similar to that seen on  prior examination though exact measurement is difficult given the irregular contour of the mass and lack of contrast administration. There is resultant progressive, moderate bilateral hydronephrosis with obstruction of the ureterovesicular junctions bilaterally by the lobulated mass. Renal cortical thickness has been preserved. The kidneys are normal in size and position. No superimposed intrarenal masses or calcifications are seen. The adrenal glands are unremarkable. Stomach/Bowel: Aside from the eccentric mural mass involving the anterior wall of the rectum, the stomach, small bowel, and large bowel are unremarkable. No evidence of obstruction or focal inflammation. Appendix normal. No free intraperitoneal gas or fluid. Vascular/Lymphatic: There is progressive retroperitoneal and pelvic adenopathy. By example a new periaortic lymph node has developed at axial image # 46/2 measuring 12 mm x 22 mm. Pathologic adenopathy is now seen within the left periaortic, bilateral common iliac, external iliac, and perirectal lymph node groups. Presacral edema appears similar. Mild aortoiliac atherosclerotic calcification is present. No aortic aneurysm. Reproductive: As noted above, the prostate gland is confluent with multiple lobulated soft tissue mass is invading the base of the bladder and anterior  wall of the rectum in keeping with a primary prostatic malignancy. Other: Increasing anasarca with subcutaneous edema involving the body wall. Musculoskeletal: Multiple sclerotic metastases are again seen throughout the visualized axial skeleton which appears slightly progressive since prior examination. By example a. sclerotic metastasis involving the L4 vertebral body has slightly increased in size, best appreciated on sagittal reformats. No pathologic fracture IMPRESSION: Locally invasive prostate malignancy with multiple lobulated masses involving the base of the bladder as well as the anterior wall of the rectum. Resultant obstruction of the ureterovesicular junctions bilaterally with progressive, moderate bilateral hydronephrosis. Preserved renal parenchymal volume. Interval disease progression with development of extensive retroperitoneal and pelvic adenopathy as well as slight interval progression of widespread sclerotic osseous metastases. Electronically Signed   By: Fidela Salisbury MD   On: 09/28/2020 22:21   DG Hip Unilat With Pelvis 2-3 Views Right  Result Date: 09/28/2020 CLINICAL DATA:  Status post fall 3 days ago with right hip pain. History of prostate cancer. EXAM: DG HIP (WITH OR WITHOUT PELVIS) 2-3V RIGHT COMPARISON:  Abdomen and pelvis CT, dated June 14, 2020 FINDINGS: There is no evidence of an acute hip fracture or dislocation. Degenerative changes are seen involving both hips in the form of joint space narrowing and acetabular sclerosis. Ill-defined sclerotic changes are seen involving the medial aspects of the bilateral inferior pubic rami and lateral aspect of the lower right iliac bone. Soft tissue structures are unremarkable. IMPRESSION: 1. No acute osseous abnormality. 2. Sclerotic changes within the pelvis, as described above, consistent with patient's known history of osseous metastasis. Electronically Signed   By: Virgina Norfolk M.D.   On: 09/28/2020 18:52   IR NEPHROSTOMY  PLACEMENT LEFT  Result Date: 09/29/2020 INDICATION: 70 year old male with prostate carcinoma, hydronephrosis and acute renal failure referred for bilateral percutaneous nephrostomy. EXAM: IR NEPHROSTOMY PLACEMENT LEFT; IR NEPHROSTOMY PLACEMENT RIGHT COMPARISON:  CT 09/28/2020 MEDICATIONS: None ANESTHESIA/SEDATION: Fentanyl 50 mcg IV; Versed 1.0 mg IV Moderate Sedation Time:  15 minutes The patient was continuously monitored during the procedure by the interventional radiology nurse under my direct supervision. CONTRAST:  3mL OMNIPAQUE IOHEXOL 300 MG/ML SOLN - administered into the collecting system(s) FLUOROSCOPY TIME:  Fluoroscopy Time: 1 minutes 48 seconds (23 mGy). COMPLICATIONS: None PROCEDURE: Informed written consent was obtained from the patient after a thorough discussion of the procedural risks, benefits and alternatives. All questions were addressed. Maximal Sterile Barrier Technique  was utilized including caps, mask, sterile gowns, sterile gloves, sterile drape, hand hygiene and skin antiseptic. A timeout was performed prior to the initiation of the procedure. Patient positioned prone position on the fluoroscopy table. Ultrasound survey of the bilateral flank was performed with images stored and sent to PACs. Left: The patient was then prepped and draped in the usual sterile fashion. 1% lidocaine was used to anesthetize the skin and subcutaneous tissues for local anesthesia. A Chiba needle was then used to access a posterior inferior calyx of the right kidney with ultrasound guidance. With spontaneous urine returned through the needle, passage of an 018 micro wire into the collecting system was performed under fluoroscopy. A small incision was made with an 11 blade scalpel, and the needle was removed from the wire. An Accustick system was then advanced over the wire into the collecting system under fluoroscopy. The metal stiffener and inner dilator were removed, and then a sample of fluid was aspirated  through the 4 French outer sheath. Bentson wire was passed into the collecting system and the sheath removed. Ten French dilation of the soft tissues was performed. Using modified Seldinger technique, a 10 French pigtail catheter drain was placed over the Bentson wire. Wire and inner stiffener removed, and the pigtail was formed in the collecting system. Small amount of contrast confirmed position of the catheter. Right: 1% lidocaine was used to anesthetize the skin and subcutaneous tissues for local anesthesia. A Chiba needle was then used to access a posterior inferior calyx of the left kidney with ultrasound guidance. With spontaneous urine returned through the needle, passage of an 018 micro wire into the collecting system was performed under fluoroscopy. A small incision was made with an 11 blade scalpel, and the needle was removed from the wire. An Accustick system was then advanced over the wire into the collecting system under fluoroscopy. The metal stiffener and inner dilator were removed, and then a sample of fluid was aspirated through the 4 French outer sheath. Bentson wire was passed into the collecting system and the sheath removed. Ten French dilation of the soft tissues was performed. Using modified Seldinger technique, a 10 French pigtail catheter drain was placed over the Bentson wire. Wire and inner stiffener removed, and the pigtail was formed in the collecting system. Small amount of contrast confirmed position of the catheter. Patient tolerated the procedure well and remained hemodynamically stable throughout. No complications were encountered and no significant blood loss encountered IMPRESSION: Status post image guided bilateral percutaneous nephrostomy Signed, Dulcy Fanny. Dellia Nims, RPVI [facility] Electronically Signed   By: Corrie Mckusick D.O.   On: 09/29/2020 14:45   IR NEPHROSTOMY PLACEMENT RIGHT  Result Date:  09/29/2020 INDICATION: 70 year old male with prostate carcinoma, hydronephrosis and acute renal failure referred for bilateral percutaneous nephrostomy. EXAM: IR NEPHROSTOMY PLACEMENT LEFT; IR NEPHROSTOMY PLACEMENT RIGHT COMPARISON:  CT 09/28/2020 MEDICATIONS: None ANESTHESIA/SEDATION: Fentanyl 50 mcg IV; Versed 1.0 mg IV Moderate Sedation Time:  15 minutes The patient was continuously monitored during the procedure by the interventional radiology nurse under my direct supervision. CONTRAST:  16mL OMNIPAQUE IOHEXOL 300 MG/ML SOLN - administered into the collecting system(s) FLUOROSCOPY TIME:  Fluoroscopy Time: 1 minutes 48 seconds (23 mGy). COMPLICATIONS: None PROCEDURE: Informed written consent was obtained from the patient after a thorough discussion of the procedural risks, benefits and alternatives. All questions were addressed. Maximal Sterile Barrier Technique was utilized including caps, mask, sterile gowns, sterile gloves, sterile drape, hand hygiene  and skin antiseptic. A timeout was performed prior to the initiation of the procedure. Patient positioned prone position on the fluoroscopy table. Ultrasound survey of the bilateral flank was performed with images stored and sent to PACs. Left: The patient was then prepped and draped in the usual sterile fashion. 1% lidocaine was used to anesthetize the skin and subcutaneous tissues for local anesthesia. A Chiba needle was then used to access a posterior inferior calyx of the right kidney with ultrasound guidance. With spontaneous urine returned through the needle, passage of an 018 micro wire into the collecting system was performed under fluoroscopy. A small incision was made with an 11 blade scalpel, and the needle was removed from the wire. An Accustick system was then advanced over the wire into the collecting system under fluoroscopy. The metal stiffener and inner dilator were removed, and then a sample of fluid was aspirated through the 4 French outer  sheath. Bentson wire was passed into the collecting system and the sheath removed. Ten French dilation of the soft tissues was performed. Using modified Seldinger technique, a 10 French pigtail catheter drain was placed over the Bentson wire. Wire and inner stiffener removed, and the pigtail was formed in the collecting system. Small amount of contrast confirmed position of the catheter. Right: 1% lidocaine was used to anesthetize the skin and subcutaneous tissues for local anesthesia. A Chiba needle was then used to access a posterior inferior calyx of the left kidney with ultrasound guidance. With spontaneous urine returned through the needle, passage of an 018 micro wire into the collecting system was performed under fluoroscopy. A small incision was made with an 11 blade scalpel, and the needle was removed from the wire. An Accustick system was then advanced over the wire into the collecting system under fluoroscopy. The metal stiffener and inner dilator were removed, and then a sample of fluid was aspirated through the 4 French outer sheath. Bentson wire was passed into the collecting system and the sheath removed. Ten French dilation of the soft tissues was performed. Using modified Seldinger technique, a 10 French pigtail catheter drain was placed over the Bentson wire. Wire and inner stiffener removed, and the pigtail was formed in the collecting system. Small amount of contrast confirmed position of the catheter. Patient tolerated the procedure well and remained hemodynamically stable throughout. No complications were encountered and no significant blood loss encountered IMPRESSION: Status post image guided bilateral percutaneous nephrostomy Signed, Dulcy Fanny. Dellia Nims, RPVI [facility] Electronically Signed   By: Corrie Mckusick D.O.   On: 09/29/2020 14:45   NM PET (F18-PYLARIFY) SKULL TO MID THIGH  Result Date: 09/19/2020 CLINICAL DATA:   Prostate cancer with bone metastasis. Prostate biopsied 03/09/2019. Chemotherapy in 06/24/2019. Radiation therapy in 2021. Recent PSA of 91.5. EXAM: NUCLEAR MEDICINE PET SKULL BASE TO THIGH TECHNIQUE: 9.6 mCi F18 Piflufolastat (Pylarify) was injected intravenously. Full-ring PET imaging was performed from the skull base to thigh after the radiotracer. CT data was obtained and used for attenuation correction and anatomic localization. COMPARISON:  Chest abdomen and pelvic CTs of 06/14/2020. FINDINGS: NECK Right low jugular/supraclavicular node measures 8 mm and a S.U.V. max of 10.0 on 61/4. Incidental CT finding: Bilateral carotid atherosclerosis. No other cervical adenopathy. CHEST No radiotracer accumulation within mediastinal or hilar lymph nodes. No tracer avid pulmonary nodules on the CT scan. Incidental CT finding: Right Port-A-Cath tip at low SVC. Aortic atherosclerosis. Right upper lobe 6 mm nodule on 30/8 is unchanged  and below PET resolution. ABDOMEN/PELVIS The dominant pelvic mass, with extension into the urinary bladder is poorly evaluated secondary to physiologic bladder hypermetabolism. Measures on the order of 5.9 x 5.6 cm on 200/4. Corresponds to tracer affinity centered about the right side of the prostate including at a S.U.V. max of 15.4. Compare 5.3 x 5.5 cm on the prior exam (when remeasured). Tracer avid abdominal retroperitoneal nodes. Example left periaortic node of 1.5 cm and a S.U.V. max of 28.2 on 145/4. Compare 1.2 cm on 06/14/2020 (when remeasured). A node within the left common iliac station measures 1.1 cm and a S.U.V. max of 11.0 on 175/4. Compare 9 mm on the prior CT (when remeasured). Extensive hypermetabolic pelvic adenopathy. Left external iliac node measures 1.1 cm and a S.U.V. max of 29.6 on 195/4. Newly enlarged since the prior CT. Tracer avid nodes surrounding pelvic bowel loops. Example anterior right pelvic node of 1.1 cm and a S.U.V. max of 28.8 on 188/4, new. A right  perirectal node measures 8 mm and a S.U.V. max of 6.4 on 204/4, likely new since the prior CT. Incidental CT finding: Abdominal aortic atherosclerosis. Moderate right-sided hydroureteronephrosis is followed to the level of the ureterovesicular junction and the locally advanced prostate primary, similar. Interval development of mild to moderate left-sided hydronephrosis and mild left hydroureter, also followed to the level of the ureterovesicular junction. Normal adrenal glands. Colonic stool burden suggests constipation. SKELETON Extensive tracer avid osseous metastasis. Eccentric left coccygeal lesion measures a S.U.V. max of 19.2, including on 194/4. A left acetabular lesion measures a S.U.V. max of 23.7 on 193/4. L4 lesion measures a S.U.V. max of 39.5. IMPRESSION: 1. Locally advanced prostate primary with bladder extension. Given concurrent physiologic urinary tracer affinity, the primary is somewhat challenging to evaluate. Felt to be slightly enlarged compared to 06/14/2020. Persistent secondary right and new left sided hydroureteronephrosis. 2. Moderate abdominal and marked pelvic nodal metastasis, progressive since 06/14/2020. 3. Isolated right supraclavicular/low cervical tracer avid nodal metastasis. 4. Widespread osseous metastasis. 5. No change in 6 mm right upper lobe pulmonary nodule, below PET resolution. Electronically Signed   By: Abigail Miyamoto M.D.   On: 09/19/2020 12:30    Lab Data:  CBC: Recent Labs  Lab 09/28/20 1757 09/29/20 0425 09/30/20 0446  WBC 6.3 7.9 7.0  NEUTROABS 4.8  --   --   HGB 7.9* 8.7* 8.1*  HCT 26.1* 28.8* 26.6*  MCV 92.2 92.9 92.0  PLT 286 317 829   Basic Metabolic Panel: Recent Labs  Lab 09/28/20 1757 09/29/20 0425 09/30/20 0446 10/01/20 0540 10/02/20 0511  NA 137 138 140 142 145  K 4.5 4.3 4.7 3.8 3.6  CL 100 103 103 103 107  CO2 27 26 27 29 31   GLUCOSE 117* 107* 80 94 113*  BUN 78* 78* 73* 64* 50*  CREATININE 4.40* 4.66* 4.82* 4.33* 3.13*   CALCIUM 9.1 9.0 9.0 8.6* 8.9  MG  --   --   --   --  2.0   GFR: Estimated Creatinine Clearance: 24.6 mL/min (A) (by C-G formula based on SCr of 3.13 mg/dL (H)). Liver Function Tests: Recent Labs  Lab 09/29/20 0425  AST 20  ALT 11  ALKPHOS 81  BILITOT 0.5  PROT 8.0  ALBUMIN 3.1*   No results for input(s): LIPASE, AMYLASE in the last 168 hours. No results for input(s): AMMONIA in the last 168 hours. Coagulation Profile: No results for input(s): INR, PROTIME in the last 168 hours. Cardiac Enzymes: No  results for input(s): CKTOTAL, CKMB, CKMBINDEX, TROPONINI in the last 168 hours. BNP (last 3 results) No results for input(s): PROBNP in the last 8760 hours. HbA1C: No results for input(s): HGBA1C in the last 72 hours. CBG: No results for input(s): GLUCAP in the last 168 hours. Lipid Profile: No results for input(s): CHOL, HDL, LDLCALC, TRIG, CHOLHDL, LDLDIRECT in the last 72 hours. Thyroid Function Tests: No results for input(s): TSH, T4TOTAL, FREET4, T3FREE, THYROIDAB in the last 72 hours. Anemia Panel: No results for input(s): VITAMINB12, FOLATE, FERRITIN, TIBC, IRON, RETICCTPCT in the last 72 hours. Urine analysis:    Component Value Date/Time   COLORURINE YELLOW 09/29/2020 1128   APPEARANCEUR CLEAR 09/29/2020 1128   LABSPEC 1.010 09/29/2020 1128   PHURINE 7.5 09/29/2020 1128   GLUCOSEU NEGATIVE 09/29/2020 1128   HGBUR LARGE (A) 09/29/2020 1128   BILIRUBINUR NEGATIVE 09/29/2020 1128   BILIRUBINUR negative 09/14/2019 1105   KETONESUR NEGATIVE 09/29/2020 1128   PROTEINUR 100 (A) 09/29/2020 1128   UROBILINOGEN 0.2 09/14/2019 1105   NITRITE NEGATIVE 09/29/2020 [place] 09/29/2020 1128     Maclin Guerrette M.D. [facility] 10/02/2020, 3:46 PM  Available via Epic secure chat 7am-7pm After 7 pm, please refer to night coverage provider listed on amion.

## 2020-10-03 LAB — BASIC METABOLIC PANEL
Anion gap: 7 (ref 5–15)
BUN: 33 mg/dL — ABNORMAL HIGH (ref 8–23)
CO2: 29 mmol/L (ref 22–32)
Calcium: 8.9 mg/dL (ref 8.9–10.3)
Chloride: 104 mmol/L (ref 98–111)
Creatinine, Ser: 2.15 mg/dL — ABNORMAL HIGH (ref 0.61–1.24)
GFR, Estimated: 32 mL/min — ABNORMAL LOW (ref >60)
Glucose, Bld: 89 mg/dL (ref 70–99)
Potassium: 3.4 mmol/L — ABNORMAL LOW (ref 3.5–5.1)
Sodium: 140 mmol/L (ref 135–145)

## 2020-10-03 MED ORDER — NALOXEGOL OXALATE 12.5 MG PO TABS
12.5000 mg | ORAL_TABLET | Freq: Every day | ORAL | Status: DC
  Administered 2020-10-03 – 2020-10-05 (×3): 12.5 mg via ORAL
  Filled 2020-10-03 (×3): qty 1

## 2020-10-03 MED ORDER — ENOXAPARIN SODIUM 40 MG/0.4ML IJ SOSY
40.0000 mg | PREFILLED_SYRINGE | INTRAMUSCULAR | Status: DC
  Administered 2020-10-04 – 2020-10-05 (×2): 40 mg via SUBCUTANEOUS
  Filled 2020-10-03 (×2): qty 0.4

## 2020-10-03 MED ORDER — GABAPENTIN 100 MG PO CAPS
100.0000 mg | ORAL_CAPSULE | Freq: Two times a day (BID) | ORAL | Status: DC
  Administered 2020-10-03 – 2020-10-04 (×3): 100 mg via ORAL
  Filled 2020-10-03 (×3): qty 1

## 2020-10-03 NOTE — Progress Notes (Signed)
[facility]                                                                              Patient Demographics  Andre Glass, is a 70 y.o. male, DOB - 07/21/1950, [name]:[id number]  Admit date - 09/28/2020   Admitting Physician Elwyn Reach, MD  Outpatient Primary MD for the patient is Andre Del, DO  Outpatient specialists:   LOS - 5  days   Medical records reviewed and are as summarized below:    Chief Complaint  Patient presents with   Leg Swelling   Leg Pain   Constipation       Brief summary   70 year old male with medical history of metastatic prostate cancer, locally invasive, anemia of chronic disease, history of kidney stones, recurrent UTI, recurrent fecal impaction with narcotics came to hospital with complaints of intractable pain in the pelvic area and back requiring more narcotics for pain control as well as constipation.  In the ED he was disimpacted.  Constipation is believed to be due to narcotics.  He was also found to have FOBT positive with hemoglobin less than 8.  As part of work-up he was found to have locally invasive cancer on the CT scan abdomen/pelvis with bilateral hydronephrosis.  Urology was consulted.   CT renal stone study shows locally invasive prostate cancer with multiple lobulated masses in the base of the bladder and anterior wall of the rectum.  This has led to a ureterovesicular junction obstruction with bilateral progressing moderate bilateral hydronephrosis.  Urology consulted and patient being admitted to the hospital for further evaluation and treatment   IR was consulted and patient underwent percutaneous nephrostomy tube placement for obstructive uropathy/bilateral hydronephrosis.   Assessment & Plan    Principal Problem:   Obstructive uropathy with bilateral hydronephrosis -CTAP showed lobulated masses from prostate CA obstructing the ureterovesical junction causing bilateral hydronephrosis  -Urology consulted,  status post percutaneous nephrostomy tube placement by IR -Per urology will need ureteral stent placement once hydronephrosis is decompressed and renal function has improved -Creatinine continues to improve, 2.1 today   Active Problems: Acute kidney injury on CKD stage IIIa  -Baseline creatinine around 1.4, presented with creatinine of 4.4 likely due to #1 -Improving, creatinine 2.1 today  Constipation -Likely due to opioid/pain management, status post disimpaction in ED - on MiraLAX twice daily, Dulcolax, Senokot  -Per patient, still no BM, placed on Movantik -Per patient,  regular stool softeners and laxatives do not work at home, will place on Movantik if affordable upon discharge.    Metastatic castration-resistant adenocarcinoma of prostate (Andre Glass), intractable pain -Castrate resistant, currently on cabizitaxel, outpatient follow-up by oncology -Continue MS Contin, added bowel regimen  Hypertension, uncontrolled -Continue amlodipine 10 mg daily  Anemia of chronic disease, malignancy -At baseline  ~8, hemoglobin at baseline 8.1  Neuropathy -Per patient he has neuropathy in both lower extremities and hands, added Neurontin 100 mg twice daily  Obesity Estimated body mass index is 32.05 kg/m as calculated from the following:   Height as of this encounter: 5\' 8"  (1.727 m).   Weight as of this encounter: 95.6 kg.  Code  Status: Full CODE STATUS DVT Prophylaxis:  enoxaparin (LOVENOX) injection 40 mg Start: 10/04/20 1000   Level of Care: Level of care: Med-Surg Family Communication: Discussed all imaging results, lab results, explained to the patient and brother at the bedside   Disposition Plan:     Status is: Inpatient  Remains inpatient appropriate because:Inpatient level of care appropriate due to severity of illness  Dispo: The patient is from: Home              Anticipated d/c is to: Home              Patient currently is not medically stable to d/c.,  If creatinine  improving and has a BM, most likely will DC in a.m.   Difficult to place patient No      Time Spent in minutes 25 minutes  Procedures:  Bilateral PCN placement  Consultants:   Urology Interventional radiology  Antimicrobials:   Anti-infectives (From admission, onward)    None          Medications  Scheduled Meds:  amLODipine  10 mg Oral Daily   bisacodyl  10 mg Rectal Once   Chlorhexidine Gluconate Cloth  6 each Topical Daily   [START ON 10/04/2020] enoxaparin (LOVENOX) injection  40 mg Subcutaneous Q24H   gabapentin  100 mg Oral BID   morphine  60 mg Oral Q8H   naloxegol oxalate  12.5 mg Oral Daily   polyethylene glycol  17 g Oral BID   predniSONE  5 mg Oral Q breakfast   senna-docusate  2 tablet Oral BID   Continuous Infusions:  lactated ringers 100 mL/hr at 10/03/20 0857   PRN Meds:.Glycerin (Adult), HYDROmorphone (DILAUDID) injection, labetalol, ondansetron **OR** ondansetron (ZOFRAN) IV      Subjective:   Ronte Parker was seen and examined today.  States still waiting for a BM.  Otherwise no acute complaints.  Has neuropathy in both legs and hands.  Patient denies dizziness, chest pain, shortness of breath, abdominal pain, N/V.  No acute events overnight.  + Constipation  Objective:   Vitals:   10/02/20 1536 10/02/20 2035 10/03/20 0500 10/03/20 1403  BP: (!) 145/70 139/69 (!) 183/86 (!) 189/89  Pulse: 65 67 76 95  Resp: 18 16 18 16   Temp: 98.2 F (36.8 C) 99.6 F (37.6 C) 98.3 F (36.8 C) 98.6 F (37 C)  TempSrc: Oral Oral Oral Oral  SpO2: 98% 98% 99% 99%  Weight:      Height:        Intake/Output Summary (Last 24 hours) at 10/03/2020 1535 Last data filed at 10/03/2020 1400 Gross per 24 hour  Intake 720 ml  Output 5125 ml  Net -4405 ml     Wt Readings from Last 3 Encounters:  09/29/20 95.6 kg  09/24/20 95.3 kg  09/11/20 91.6 kg   Physical Exam General: Alert and oriented x 3, NAD Cardiovascular: S1 S2 clear, RRR. No pedal edema  b/l Respiratory: CTAB, no wheezing, rales or rhonchi Gastrointestinal: Soft, nontender, nondistended, NBS Ext: no pedal edema bilaterally Neuro: no new deficits Psych: Normal affect and demeanor, alert and oriented x3  GU: Bilateral nephrostomy tubes    Data Reviewed:  I have personally reviewed following labs and imaging studies  Micro Results Recent Results (from the past 240 hour(s))  Resp Panel by RT-PCR (Flu A&B, Covid) Nasopharyngeal Swab     Status: None   Collection Time: 09/28/20  7:52 PM   Specimen: Nasopharyngeal Swab; Nasopharyngeal(NP) swabs in vial  transport medium  Result Value Ref Range Status   SARS Coronavirus 2 by RT PCR NEGATIVE NEGATIVE Final    Comment: (NOTE) SARS-CoV-2 target nucleic acids are NOT DETECTED.  The SARS-CoV-2 RNA is generally detectable in upper respiratory specimens during the acute phase of infection. The lowest concentration of SARS-CoV-2 viral copies this assay can detect is 138 copies/mL. A negative result does not preclude SARS-Cov-2 infection and should not be used as the sole basis for treatment or other patient management decisions. A negative result may occur with  improper specimen collection/handling, submission of specimen other than nasopharyngeal swab, presence of viral mutation(s) within the areas targeted by this assay, and inadequate number of viral copies(<138 copies/mL). A negative result must be combined with clinical observations, patient history, and epidemiological information. The expected result is Negative.  Fact Sheet for Patients:  EntrepreneurPulse.com.au  Fact Sheet for Healthcare Providers:  IncredibleEmployment.be  This test is no t yet approved or cleared by the [place] FDA and  has been authorized for detection and/or diagnosis of SARS-CoV-2 by FDA under an Emergency Use Authorization (EUA). This EUA will remain  in effect (meaning this test can be used) for  the duration of the COVID-19 declaration under Section 564(b)(1) of the Act, 21 U.S.C.section 360bbb-3(b)(1), unless the authorization is terminated  or revoked sooner.       Influenza A by PCR NEGATIVE NEGATIVE Final   Influenza B by PCR NEGATIVE NEGATIVE Final    Comment: (NOTE) The Xpert Xpress SARS-CoV-2/FLU/RSV plus assay is intended as an aid in the diagnosis of influenza from Nasopharyngeal swab specimens and should not be used as a sole basis for treatment. Nasal washings and aspirates are unacceptable for Xpert Xpress SARS-CoV-2/FLU/RSV testing.  Fact Sheet for Patients: EntrepreneurPulse.com.au  Fact Sheet for Healthcare Providers: IncredibleEmployment.be  This test is not yet approved or cleared by the [place] FDA and has been authorized for detection and/or diagnosis of SARS-CoV-2 by FDA under an Emergency Use Authorization (EUA). This EUA will remain in effect (meaning this test can be used) for the duration of the COVID-19 declaration under Section 564(b)(1) of the Act, 21 U.S.C. section 360bbb-3(b)(1), unless the authorization is terminated or revoked.  Performed at [facility], [place] [address] [place], [place] [address]   Urine culture     Status: None   Collection Time: 09/29/20 11:28 AM   Specimen: Urine, Clean Catch  Result Value Ref Range Status   Specimen Description   Final    URINE, CLEAN CATCH Performed at [facility], [place] [address], [place], [place] [address]    Special Requests   Final    NONE Performed at [facility], [place] [address], [place], [place] [address]    Culture   Final    NO GROWTH Performed at [facility], [place] [name] [place],  [address]    Report Status 10/01/2020 FINAL  Final    Radiology Reports CT Renal Stone Study  Result Date: 09/28/2020 CLINICAL DATA:  Acute renal failure, right hip pain EXAM: CT  ABDOMEN AND PELVIS WITHOUT CONTRAST TECHNIQUE: Multidetector CT imaging of the abdomen and pelvis was performed following the standard protocol without IV contrast. COMPARISON:  06/14/2020 FINDINGS: Lower chest: Visualized lung bases are clear. The visualized heart and pericardium are unremarkable. Hepatobiliary: No focal liver abnormality is seen. No gallstones, gallbladder wall thickening, or biliary dilatation. Pancreas: Unremarkable Spleen: Unremarkable Adrenals/Urinary Tract: Multiple lobulated mural masses are again identified  within the base of the bladder which appear confluent with the dome of the prostate gland. There is, additionally, confluent soft tissue with the prostate gland resulting in thickening of the anterior wall of the rectum, best seen on axial image # 82/2. Altogether, these are in keeping with a locally invasive prostate malignancy with invasion of the base of the bladder as well as the anterior wall the rectum. This appears similar to that seen on prior examination though exact measurement is difficult given the irregular contour of the mass and lack of contrast administration. There is resultant progressive, moderate bilateral hydronephrosis with obstruction of the ureterovesicular junctions bilaterally by the lobulated mass. Renal cortical thickness has been preserved. The kidneys are normal in size and position. No superimposed intrarenal masses or calcifications are seen. The adrenal glands are unremarkable. Stomach/Bowel: Aside from the eccentric mural mass involving the anterior wall of the rectum, the stomach, small bowel, and large bowel are unremarkable. No evidence of obstruction or focal inflammation. Appendix normal. No free intraperitoneal gas or fluid. Vascular/Lymphatic: There is progressive retroperitoneal and pelvic adenopathy. By example a new periaortic lymph node has developed at axial image # 46/2 measuring 12 mm x 22 mm. Pathologic adenopathy is now seen within the  left periaortic, bilateral common iliac, external iliac, and perirectal lymph node groups. Presacral edema appears similar. Mild aortoiliac atherosclerotic calcification is present. No aortic aneurysm. Reproductive: As noted above, the prostate gland is confluent with multiple lobulated soft tissue mass is invading the base of the bladder and anterior wall of the rectum in keeping with a primary prostatic malignancy. Other: Increasing anasarca with subcutaneous edema involving the body wall. Musculoskeletal: Multiple sclerotic metastases are again seen throughout the visualized axial skeleton which appears slightly progressive since prior examination. By example a. sclerotic metastasis involving the L4 vertebral body has slightly increased in size, best appreciated on sagittal reformats. No pathologic fracture IMPRESSION: Locally invasive prostate malignancy with multiple lobulated masses involving the base of the bladder as well as the anterior wall of the rectum. Resultant obstruction of the ureterovesicular junctions bilaterally with progressive, moderate bilateral hydronephrosis. Preserved renal parenchymal volume. Interval disease progression with development of extensive retroperitoneal and pelvic adenopathy as well as slight interval progression of widespread sclerotic osseous metastases. Electronically Signed   By: Fidela Salisbury MD   On: 09/28/2020 22:21   DG Hip Unilat With Pelvis 2-3 Views Right  Result Date: 09/28/2020 CLINICAL DATA:  Status post fall 3 days ago with right hip pain. History of prostate cancer. EXAM: DG HIP (WITH OR WITHOUT PELVIS) 2-3V RIGHT COMPARISON:  Abdomen and pelvis CT, dated June 14, 2020 FINDINGS: There is no evidence of an acute hip fracture or dislocation. Degenerative changes are seen involving both hips in the form of joint space narrowing and acetabular sclerosis. Ill-defined sclerotic changes are seen involving the medial aspects of the bilateral inferior pubic rami  and lateral aspect of the lower right iliac bone. Soft tissue structures are unremarkable. IMPRESSION: 1. No acute osseous abnormality. 2. Sclerotic changes within the pelvis, as described above, consistent with patient's known history of osseous metastasis. Electronically Signed   By: Virgina Norfolk M.D.   On: 09/28/2020 18:52   IR NEPHROSTOMY PLACEMENT LEFT  Result Date: 09/29/2020 INDICATION: 70 year old male with prostate carcinoma, hydronephrosis and acute renal failure referred for bilateral percutaneous nephrostomy. EXAM: IR NEPHROSTOMY PLACEMENT LEFT; IR NEPHROSTOMY PLACEMENT RIGHT COMPARISON:  CT 09/28/2020 MEDICATIONS: None ANESTHESIA/SEDATION: Fentanyl 50 mcg IV; Versed 1.0 mg IV Moderate  Sedation Time:  15 minutes The patient was continuously monitored during the procedure by the interventional radiology nurse under my direct supervision. CONTRAST:  18mL OMNIPAQUE IOHEXOL 300 MG/ML SOLN - administered into the collecting system(s) FLUOROSCOPY TIME:  Fluoroscopy Time: 1 minutes 48 seconds (23 mGy). COMPLICATIONS: None PROCEDURE: Informed written consent was obtained from the patient after a thorough discussion of the procedural risks, benefits and alternatives. All questions were addressed. Maximal Sterile Barrier Technique was utilized including caps, mask, sterile gowns, sterile gloves, sterile drape, hand hygiene and skin antiseptic. A timeout was performed prior to the initiation of the procedure. Patient positioned prone position on the fluoroscopy table. Ultrasound survey of the bilateral flank was performed with images stored and sent to PACs. Left: The patient was then prepped and draped in the usual sterile fashion. 1% lidocaine was used to anesthetize the skin and subcutaneous tissues for local anesthesia. A Chiba needle was then used to access a posterior inferior calyx of the right kidney with ultrasound guidance. With spontaneous urine returned through the needle, passage of an 018  micro wire into the collecting system was performed under fluoroscopy. A small incision was made with an 11 blade scalpel, and the needle was removed from the wire. An Accustick system was then advanced over the wire into the collecting system under fluoroscopy. The metal stiffener and inner dilator were removed, and then a sample of fluid was aspirated through the 4 French outer sheath. Bentson wire was passed into the collecting system and the sheath removed. Ten French dilation of the soft tissues was performed. Using modified Seldinger technique, a 10 French pigtail catheter drain was placed over the Bentson wire. Wire and inner stiffener removed, and the pigtail was formed in the collecting system. Small amount of contrast confirmed position of the catheter. Right: 1% lidocaine was used to anesthetize the skin and subcutaneous tissues for local anesthesia. A Chiba needle was then used to access a posterior inferior calyx of the left kidney with ultrasound guidance. With spontaneous urine returned through the needle, passage of an 018 micro wire into the collecting system was performed under fluoroscopy. A small incision was made with an 11 blade scalpel, and the needle was removed from the wire. An Accustick system was then advanced over the wire into the collecting system under fluoroscopy. The metal stiffener and inner dilator were removed, and then a sample of fluid was aspirated through the 4 French outer sheath. Bentson wire was passed into the collecting system and the sheath removed. Ten French dilation of the soft tissues was performed. Using modified Seldinger technique, a 10 French pigtail catheter drain was placed over the Bentson wire. Wire and inner stiffener removed, and the pigtail was formed in the collecting system. Small amount of contrast confirmed position of the catheter. Patient tolerated the procedure well and remained hemodynamically stable throughout. No complications were encountered  and no significant blood loss encountered IMPRESSION: Status post image guided bilateral percutaneous nephrostomy Signed, Dulcy Fanny. Dellia Nims, RPVI [facility] Radiology Electronically Signed   By: Corrie Mckusick D.O.   On: 09/29/2020 14:45   IR NEPHROSTOMY PLACEMENT RIGHT  Result Date: 09/29/2020 INDICATION: 70 year old male with prostate carcinoma, hydronephrosis and acute renal failure referred for bilateral percutaneous nephrostomy. EXAM: IR NEPHROSTOMY PLACEMENT LEFT; IR NEPHROSTOMY PLACEMENT RIGHT COMPARISON:  CT 09/28/2020 MEDICATIONS: None ANESTHESIA/SEDATION: Fentanyl 50 mcg IV; Versed 1.0 mg IV Moderate Sedation Time:  15 minutes The patient was continuously monitored during the procedure  by the interventional radiology nurse under my direct supervision. CONTRAST:  82mL OMNIPAQUE IOHEXOL 300 MG/ML SOLN - administered into the collecting system(s) FLUOROSCOPY TIME:  Fluoroscopy Time: 1 minutes 48 seconds (23 mGy). COMPLICATIONS: None PROCEDURE: Informed written consent was obtained from the patient after a thorough discussion of the procedural risks, benefits and alternatives. All questions were addressed. Maximal Sterile Barrier Technique was utilized including caps, mask, sterile gowns, sterile gloves, sterile drape, hand hygiene and skin antiseptic. A timeout was performed prior to the initiation of the procedure. Patient positioned prone position on the fluoroscopy table. Ultrasound survey of the bilateral flank was performed with images stored and sent to PACs. Left: The patient was then prepped and draped in the usual sterile fashion. 1% lidocaine was used to anesthetize the skin and subcutaneous tissues for local anesthesia. A Chiba needle was then used to access a posterior inferior calyx of the right kidney with ultrasound guidance. With spontaneous urine returned through the needle, passage of an 018 micro wire into the collecting system was  performed under fluoroscopy. A small incision was made with an 11 blade scalpel, and the needle was removed from the wire. An Accustick system was then advanced over the wire into the collecting system under fluoroscopy. The metal stiffener and inner dilator were removed, and then a sample of fluid was aspirated through the 4 French outer sheath. Bentson wire was passed into the collecting system and the sheath removed. Ten French dilation of the soft tissues was performed. Using modified Seldinger technique, a 10 French pigtail catheter drain was placed over the Bentson wire. Wire and inner stiffener removed, and the pigtail was formed in the collecting system. Small amount of contrast confirmed position of the catheter. Right: 1% lidocaine was used to anesthetize the skin and subcutaneous tissues for local anesthesia. A Chiba needle was then used to access a posterior inferior calyx of the left kidney with ultrasound guidance. With spontaneous urine returned through the needle, passage of an 018 micro wire into the collecting system was performed under fluoroscopy. A small incision was made with an 11 blade scalpel, and the needle was removed from the wire. An Accustick system was then advanced over the wire into the collecting system under fluoroscopy. The metal stiffener and inner dilator were removed, and then a sample of fluid was aspirated through the 4 French outer sheath. Bentson wire was passed into the collecting system and the sheath removed. Ten French dilation of the soft tissues was performed. Using modified Seldinger technique, a 10 French pigtail catheter drain was placed over the Bentson wire. Wire and inner stiffener removed, and the pigtail was formed in the collecting system. Small amount of contrast confirmed position of the catheter. Patient tolerated the procedure well and remained hemodynamically stable throughout. No complications were encountered and no significant blood loss encountered  IMPRESSION: Status post image guided bilateral percutaneous nephrostomy Signed, Dulcy Fanny. Dellia Nims, RPVI [facility] Radiology Electronically Signed   By: Corrie Mckusick D.O.   On: 09/29/2020 14:45   NM PET (F18-PYLARIFY) SKULL TO MID THIGH  Result Date: 09/19/2020 CLINICAL DATA:  Prostate cancer with bone metastasis. Prostate biopsied 03/09/2019. Chemotherapy in 06/24/2019. Radiation therapy in 2021. Recent PSA of 91.5. EXAM: NUCLEAR MEDICINE PET SKULL BASE TO THIGH TECHNIQUE: 9.6 mCi F18 Piflufolastat (Pylarify) was injected intravenously. Full-ring PET imaging was performed from the skull base to thigh after the radiotracer. CT data was obtained and used for attenuation correction and anatomic  localization. COMPARISON:  Chest abdomen and pelvic CTs of 06/14/2020. FINDINGS: NECK Right low jugular/supraclavicular node measures 8 mm and a S.U.V. max of 10.0 on 61/4. Incidental CT finding: Bilateral carotid atherosclerosis. No other cervical adenopathy. CHEST No radiotracer accumulation within mediastinal or hilar lymph nodes. No tracer avid pulmonary nodules on the CT scan. Incidental CT finding: Right Port-A-Cath tip at low SVC. Aortic atherosclerosis. Right upper lobe 6 mm nodule on 30/8 is unchanged and below PET resolution. ABDOMEN/PELVIS The dominant pelvic mass, with extension into the urinary bladder is poorly evaluated secondary to physiologic bladder hypermetabolism. Measures on the order of 5.9 x 5.6 cm on 200/4. Corresponds to tracer affinity centered about the right side of the prostate including at a S.U.V. max of 15.4. Compare 5.3 x 5.5 cm on the prior exam (when remeasured). Tracer avid abdominal retroperitoneal nodes. Example left periaortic node of 1.5 cm and a S.U.V. max of 28.2 on 145/4. Compare 1.2 cm on 06/14/2020 (when remeasured). A node within the left common iliac station measures 1.1 cm and a S.U.V. max of 11.0 on 175/4. Compare 9 mm on  the prior CT (when remeasured). Extensive hypermetabolic pelvic adenopathy. Left external iliac node measures 1.1 cm and a S.U.V. max of 29.6 on 195/4. Newly enlarged since the prior CT. Tracer avid nodes surrounding pelvic bowel loops. Example anterior right pelvic node of 1.1 cm and a S.U.V. max of 28.8 on 188/4, new. A right perirectal node measures 8 mm and a S.U.V. max of 6.4 on 204/4, likely new since the prior CT. Incidental CT finding: Abdominal aortic atherosclerosis. Moderate right-sided hydroureteronephrosis is followed to the level of the ureterovesicular junction and the locally advanced prostate primary, similar. Interval development of mild to moderate left-sided hydronephrosis and mild left hydroureter, also followed to the level of the ureterovesicular junction. Normal adrenal glands. Colonic stool burden suggests constipation. [place] Extensive tracer avid osseous metastasis. Eccentric left coccygeal lesion measures a S.U.V. max of 19.2, including on 194/4. A left acetabular lesion measures a S.U.V. max of 23.7 on 193/4. L4 lesion measures a S.U.V. max of 39.5. IMPRESSION: 1. Locally advanced prostate primary with bladder extension. Given concurrent physiologic urinary tracer affinity, the primary is somewhat challenging to evaluate. Felt to be slightly enlarged compared to 06/14/2020. Persistent secondary right and new left sided hydroureteronephrosis. 2. Moderate abdominal and marked pelvic nodal metastasis, progressive since 06/14/2020. 3. Isolated right supraclavicular/low cervical tracer avid nodal metastasis. 4. Widespread osseous metastasis. 5. No change in 6 mm right upper lobe pulmonary nodule, below PET resolution. Electronically Signed   By: Abigail Miyamoto M.D.   On: 09/19/2020 12:30    Lab Data:  CBC: Recent Labs  Lab 09/28/20 1757 09/29/20 0425 09/30/20 0446  WBC 6.3 7.9 7.0  NEUTROABS 4.8  --   --   HGB 7.9* 8.7* 8.1*  HCT 26.1* 28.8* 26.6*  MCV 92.2 92.9 92.0  PLT 286  317 301   Basic Metabolic Panel: Recent Labs  Lab 09/29/20 0425 09/30/20 0446 10/01/20 0540 10/02/20 0511 10/03/20 0530  NA 138 140 142 145 140  K 4.3 4.7 3.8 3.6 3.4*  CL 103 103 103 107 104  CO2 26 27 29 31 29   GLUCOSE 107* 80 94 113* 89  BUN 78* 73* 64* 50* 33*  CREATININE 4.66* 4.82* 4.33* 3.13* 2.15*  CALCIUM 9.0 9.0 8.6* 8.9 8.9  MG  --   --   --  2.0  --    GFR: Estimated Creatinine Clearance: 35.9 mL/min (  A) (by C-G formula based on SCr of 2.15 mg/dL (H)). Liver Function Tests: Recent Labs  Lab 09/29/20 0425  AST 20  ALT 11  ALKPHOS 81  BILITOT 0.5  PROT 8.0  ALBUMIN 3.1*   No results for input(s): LIPASE, AMYLASE in the last 168 hours. No results for input(s): AMMONIA in the last 168 hours. Coagulation Profile: No results for input(s): INR, PROTIME in the last 168 hours. Cardiac Enzymes: No results for input(s): CKTOTAL, CKMB, CKMBINDEX, TROPONINI in the last 168 hours. BNP (last 3 results) No results for input(s): PROBNP in the last 8760 hours. HbA1C: No results for input(s): HGBA1C in the last 72 hours. CBG: No results for input(s): GLUCAP in the last 168 hours. Lipid Profile: No results for input(s): CHOL, HDL, LDLCALC, TRIG, CHOLHDL, LDLDIRECT in the last 72 hours. Thyroid Function Tests: No results for input(s): TSH, T4TOTAL, FREET4, T3FREE, THYROIDAB in the last 72 hours. Anemia Panel: No results for input(s): VITAMINB12, FOLATE, FERRITIN, TIBC, IRON, RETICCTPCT in the last 72 hours. Urine analysis:    Component Value Date/Time   COLORURINE YELLOW 09/29/2020 1128   APPEARANCEUR CLEAR 09/29/2020 1128   LABSPEC 1.010 09/29/2020 1128   PHURINE 7.5 09/29/2020 1128   GLUCOSEU NEGATIVE 09/29/2020 1128   HGBUR LARGE (A) 09/29/2020 1128   BILIRUBINUR NEGATIVE 09/29/2020 1128   BILIRUBINUR negative 09/14/2019 1105   KETONESUR NEGATIVE 09/29/2020 1128   PROTEINUR 100 (A) 09/29/2020 1128   UROBILINOGEN 0.2 09/14/2019 1105   NITRITE NEGATIVE  09/29/2020 [place] 09/29/2020 1128     Koralyn Prestage M.D. [facility] 10/03/2020, 3:35 PM  Available via Epic secure chat 7am-7pm After 7 pm, please refer to night coverage provider listed on amion.

## 2020-10-03 NOTE — Progress Notes (Signed)
Referring Physician(s): Borden,L  Supervising Physician: Markus Daft  Patient Status:  [facility] - In-pt  Chief Complaint: Metastatic prostate cancer, obstructive uropathy   Subjective: Patient without new complaints  Allergies: Ibuprofen and Shrimp [shellfish allergy]  Medications: Prior to Admission medications   Medication Sig Start Date End Date Taking? Authorizing Provider  HYDROmorphone (DILAUDID) 4 MG tablet Take 1 tablet (4 mg total) by mouth every 4 (four) hours as needed for severe pain. 09/27/20  Yes [name], MD  lidocaine-prilocaine (EMLA) cream Apply 1 application topically as needed. Patient taking differently: Apply 1 application topically as needed (prior to port access). 01/15/20  Yes [name], MD  morphine (MS CONTIN) 60 MG 12 hr tablet Take 1 tablet (60 mg total) by mouth every 8 (eight) hours. 09/27/20  Yes [name], MD  Multiple Vitamin (MULTIVITAMIN WITH MINERALS) TABS tablet Take 1 tablet by mouth daily.   Yes [provider]  ondansetron (ZOFRAN) 4 MG tablet Take 1 tablet (4 mg total) by mouth 3 (three) times daily as needed for nausea or vomiting. 09/24/20  Yes Shadad, Mathis Dad, MD  Plecanatide (TRULANCE) 3 MG TABS Take 3 mg by mouth daily. 08/08/20  Yes [name], [name], DO  polyethylene glycol (MIRALAX) 17 g packet Take 17 g by mouth 2 (two) times daily. 01/19/20  Yes Hongalgi, Lenis Dickinson, MD  predniSONE (DELTASONE) 5 MG tablet Take 1 tablet (5 mg total) by mouth daily with breakfast. 10/25/19  Yes Shadad, Mathis Dad, MD  prochlorperazine (COMPAZINE) 10 MG tablet Take 1 tablet (10 mg total) by mouth every 6 (six) hours as needed for nausea or vomiting. 09/24/20  Yes [name], MD  senna (SENOKOT) 8.6 MG TABS tablet Take 2 tablets by mouth daily.   Yes [provider]  tamsulosin (FLOMAX) 0.4 MG CAPS capsule TAKE 1 CAPSULE (0.4 MG TOTAL) BY MOUTH DAILY AS NEEDED (TO HELP WITH URINATION). Patient taking differently: Take 0.4 mg by  mouth daily. 09/03/20  Yes [name], [name], DO  vitamin C (ASCORBIC ACID) 500 MG tablet Take 1,000 mg by mouth daily.   Yes [provider]  Vitamin E 268 MG (400 UNIT) CHEW Chew 1 tablet by mouth daily.   Yes [provider]  NONFORMULARY OR COMPOUNDED ITEM Apply 1-2 g topically 4 (four) times daily. 04/24/20   Marzetta Board, DPM     Vital Signs: BP (!) 183/86 (BP Location: Left Arm)   Pulse 76   Temp 98.3 F (36.8 C) (Oral)   Resp 18   Ht 5\' 8"  (1.727 m)   Wt 210 lb 12.2 oz (95.6 kg)   SpO2 99%   BMI 32.05 kg/m   Physical Exam awake, alert.  Bilateral percutaneous nephrostomies intact, dressings clean and dry, insertion sites not significantly tender, output from left 3.3 L yellow urine, right 2 L slightly blood-tinged urine  Imaging: IR NEPHROSTOMY PLACEMENT LEFT  Result Date: 09/29/2020 INDICATION: 70 year old male with prostate carcinoma, hydronephrosis and acute renal failure referred for bilateral percutaneous nephrostomy. EXAM: IR NEPHROSTOMY PLACEMENT LEFT; IR NEPHROSTOMY PLACEMENT RIGHT COMPARISON:  CT 09/28/2020 MEDICATIONS: None ANESTHESIA/SEDATION: Fentanyl 50 mcg IV; Versed 1.0 mg IV Moderate Sedation Time:  15 minutes The patient was continuously monitored during the procedure by the interventional radiology nurse under my direct supervision. CONTRAST:  52mL OMNIPAQUE IOHEXOL 300 MG/ML SOLN - administered into the collecting system(s) FLUOROSCOPY TIME:  Fluoroscopy Time: 1 minutes 48 seconds (23 mGy). COMPLICATIONS: None PROCEDURE: Informed written consent was  obtained from the patient after a thorough discussion of the procedural risks, benefits and alternatives. All questions were addressed. Maximal Sterile Barrier Technique was utilized including caps, mask, sterile gowns, sterile gloves, sterile drape, hand hygiene and skin antiseptic. A timeout was performed prior to the initiation of the procedure. Patient positioned prone position on the fluoroscopy  table. Ultrasound survey of the bilateral flank was performed with images stored and sent to PACs. Left: The patient was then prepped and draped in the usual sterile fashion. 1% lidocaine was used to anesthetize the skin and subcutaneous tissues for local anesthesia. A Chiba needle was then used to access a posterior inferior calyx of the right kidney with ultrasound guidance. With spontaneous urine returned through the needle, passage of an 018 micro wire into the collecting system was performed under fluoroscopy. A small incision was made with an 11 blade scalpel, and the needle was removed from the wire. An Accustick system was then advanced over the wire into the collecting system under fluoroscopy. The metal stiffener and inner dilator were removed, and then a sample of fluid was aspirated through the 4 French outer sheath. Bentson wire was passed into the collecting system and the sheath removed. Ten French dilation of the soft tissues was performed. Using modified Seldinger technique, a 10 French pigtail catheter drain was placed over the Bentson wire. Wire and inner stiffener removed, and the pigtail was formed in the collecting system. Small amount of contrast confirmed position of the catheter. Right: 1% lidocaine was used to anesthetize the skin and subcutaneous tissues for local anesthesia. A Chiba needle was then used to access a posterior inferior calyx of the left kidney with ultrasound guidance. With spontaneous urine returned through the needle, passage of an 018 micro wire into the collecting system was performed under fluoroscopy. A small incision was made with an 11 blade scalpel, and the needle was removed from the wire. An Accustick system was then advanced over the wire into the collecting system under fluoroscopy. The metal stiffener and inner dilator were removed, and then a sample of fluid was aspirated through the 4 French outer sheath. Bentson wire was passed into the collecting system  and the sheath removed. Ten French dilation of the soft tissues was performed. Using modified Seldinger technique, a 10 French pigtail catheter drain was placed over the Bentson wire. Wire and inner stiffener removed, and the pigtail was formed in the collecting system. Small amount of contrast confirmed position of the catheter. Patient tolerated the procedure well and remained hemodynamically stable throughout. No complications were encountered and no significant blood loss encountered IMPRESSION: Status post image guided bilateral percutaneous nephrostomy Signed, Dulcy Fanny. Dellia Nims, RPVI [facility] Radiology Electronically Signed   By: Corrie Mckusick D.O.   On: 09/29/2020 14:45   IR NEPHROSTOMY PLACEMENT RIGHT  Result Date: 09/29/2020 INDICATION: 70 year old male with prostate carcinoma, hydronephrosis and acute renal failure referred for bilateral percutaneous nephrostomy. EXAM: IR NEPHROSTOMY PLACEMENT LEFT; IR NEPHROSTOMY PLACEMENT RIGHT COMPARISON:  CT 09/28/2020 MEDICATIONS: None ANESTHESIA/SEDATION: Fentanyl 50 mcg IV; Versed 1.0 mg IV Moderate Sedation Time:  15 minutes The patient was continuously monitored during the procedure by the interventional radiology nurse under my direct supervision. CONTRAST:  41mL OMNIPAQUE IOHEXOL 300 MG/ML SOLN - administered into the collecting system(s) FLUOROSCOPY TIME:  Fluoroscopy Time: 1 minutes 48 seconds (23 mGy). COMPLICATIONS: None PROCEDURE: Informed written consent was obtained from the patient after a thorough discussion of the procedural risks, benefits  and alternatives. All questions were addressed. Maximal Sterile Barrier Technique was utilized including caps, mask, sterile gowns, sterile gloves, sterile drape, hand hygiene and skin antiseptic. A timeout was performed prior to the initiation of the procedure. Patient positioned prone position on the fluoroscopy table. Ultrasound survey of the bilateral  flank was performed with images stored and sent to PACs. Left: The patient was then prepped and draped in the usual sterile fashion. 1% lidocaine was used to anesthetize the skin and subcutaneous tissues for local anesthesia. A Chiba needle was then used to access a posterior inferior calyx of the right kidney with ultrasound guidance. With spontaneous urine returned through the needle, passage of an 018 micro wire into the collecting system was performed under fluoroscopy. A small incision was made with an 11 blade scalpel, and the needle was removed from the wire. An Accustick system was then advanced over the wire into the collecting system under fluoroscopy. The metal stiffener and inner dilator were removed, and then a sample of fluid was aspirated through the 4 French outer sheath. Bentson wire was passed into the collecting system and the sheath removed. Ten French dilation of the soft tissues was performed. Using modified Seldinger technique, a 10 French pigtail catheter drain was placed over the Bentson wire. Wire and inner stiffener removed, and the pigtail was formed in the collecting system. Small amount of contrast confirmed position of the catheter. Right: 1% lidocaine was used to anesthetize the skin and subcutaneous tissues for local anesthesia. A Chiba needle was then used to access a posterior inferior calyx of the left kidney with ultrasound guidance. With spontaneous urine returned through the needle, passage of an 018 micro wire into the collecting system was performed under fluoroscopy. A small incision was made with an 11 blade scalpel, and the needle was removed from the wire. An Accustick system was then advanced over the wire into the collecting system under fluoroscopy. The metal stiffener and inner dilator were removed, and then a sample of fluid was aspirated through the 4 French outer sheath. Bentson wire was passed into the collecting system and the sheath removed. Ten French dilation  of the soft tissues was performed. Using modified Seldinger technique, a 10 French pigtail catheter drain was placed over the Bentson wire. Wire and inner stiffener removed, and the pigtail was formed in the collecting system. Small amount of contrast confirmed position of the catheter. Patient tolerated the procedure well and remained hemodynamically stable throughout. No complications were encountered and no significant blood loss encountered IMPRESSION: Status post image guided bilateral percutaneous nephrostomy Signed, Dulcy Fanny. Dellia Nims, RPVI [facility] Radiology Electronically Signed   By: Corrie Mckusick D.O.   On: 09/29/2020 14:45    Labs:  CBC: Recent Labs    09/17/20 1050 09/28/20 1757 09/29/20 0425 09/30/20 0446  WBC 7.4 6.3 7.9 7.0  HGB 8.2* 7.9* 8.7* 8.1*  HCT 26.4* 26.1* 28.8* 26.6*  PLT 306 286 317 260    COAGS: Recent Labs    10/31/19 1139  INR 1.0    BMP: Recent Labs    10/31/19 1139 11/29/19 1306 01/12/20 0852 09/30/20 0446 10/01/20 0540 10/02/20 0511 10/03/20 0530  NA 142 142   < > 140 142 145 140  K 3.6 3.5   < > 4.7 3.8 3.6 3.4*  CL 103 106   < > 103 103 107 104  CO2 32 30   < > 27 29 31 29   GLUCOSE 86  121*   < > 80 94 113* 89  BUN 15 16   < > 73* 64* 50* 33*  CALCIUM 9.2 10.1   < > 9.0 8.6* 8.9 8.9  CREATININE 0.82 1.35*   < > 4.82* 4.33* 3.13* 2.15*  GFRNONAA >60 53*   < > 12* 14* 21* 32*  GFRAA >60 >60  --   --   --   --   --    < > = values in this interval not displayed.    LIVER FUNCTION TESTS: Recent Labs    06/18/20 1251 07/18/20 1143 09/17/20 1050 09/29/20 0425  BILITOT 0.3 <0.2* 0.2* 0.5  AST 16 19 15 20   ALT 10 12 7 11   ALKPHOS 75 79 93 81  PROT 8.0 7.5 7.5 8.0  ALBUMIN 2.7* 2.9* 2.6* 3.1*    Assessment and Plan: Patient with history of metastatic prostate cancer with obstructive uropathy/AKI; status post bilateral nephrostomies on 6/26; afebrile, creatinine 2.15 down from  3.13, urine culture negative, further plans as per urology-(subsequent attempt at antegrade ureteral stent placement can then be made once AKI resolves   Electronically Signed: D. Rowe Robert, PA-C 10/03/2020, 10:16 AM   I spent a total of 15 Minutes at the the patient's bedside AND on the patient's hospital floor or unit, greater than 50% of which was counseling/coordinating care for bilateral nephrostomies    Patient ID: Cathrine Muster, male   DOB: 1950/07/30, 70 y.o.   MRN: [id number]

## 2020-10-04 ENCOUNTER — Other Ambulatory Visit (HOSPITAL_COMMUNITY): Payer: Self-pay

## 2020-10-04 ENCOUNTER — Telehealth: Payer: Self-pay | Admitting: *Deleted

## 2020-10-04 ENCOUNTER — Encounter: Payer: Self-pay | Admitting: Oncology

## 2020-10-04 LAB — BASIC METABOLIC PANEL
Anion gap: 6 (ref 5–15)
BUN: 24 mg/dL — ABNORMAL HIGH (ref 8–23)
CO2: 30 mmol/L (ref 22–32)
Calcium: 8.7 mg/dL — ABNORMAL LOW (ref 8.9–10.3)
Chloride: 103 mmol/L (ref 98–111)
Creatinine, Ser: 1.65 mg/dL — ABNORMAL HIGH (ref 0.61–1.24)
GFR, Estimated: 44 mL/min — ABNORMAL LOW (ref >60)
Glucose, Bld: 94 mg/dL (ref 70–99)
Potassium: 3.3 mmol/L — ABNORMAL LOW (ref 3.5–5.1)
Sodium: 139 mmol/L (ref 135–145)

## 2020-10-04 MED ORDER — SORBITOL 70 % SOLN
960.0000 mL | TOPICAL_OIL | Freq: Once | ORAL | Status: AC
  Administered 2020-10-04: 960 mL via RECTAL
  Filled 2020-10-04: qty 473

## 2020-10-04 MED ORDER — POTASSIUM CHLORIDE CRYS ER 20 MEQ PO TBCR
40.0000 meq | EXTENDED_RELEASE_TABLET | Freq: Once | ORAL | Status: AC
  Administered 2020-10-04: 40 meq via ORAL
  Filled 2020-10-04: qty 2

## 2020-10-04 MED ORDER — ACETAMINOPHEN 325 MG PO TABS: 650.0000 mg | ORAL_TABLET | Freq: Four times a day (QID) | ORAL | Status: DC | PRN

## 2020-10-04 MED ORDER — GABAPENTIN 100 MG PO CAPS
200.0000 mg | ORAL_CAPSULE | Freq: Two times a day (BID) | ORAL | Status: DC
  Administered 2020-10-04 – 2020-10-05 (×2): 200 mg via ORAL
  Filled 2020-10-04 (×2): qty 2

## 2020-10-04 NOTE — Telephone Encounter (Signed)
Faxed completed form to [place] at [facility] for [employer]

## 2020-10-04 NOTE — Progress Notes (Signed)
Referring Physician(s): Borden,L  Supervising Physician: Dr. Pascal Lux  Patient Status:  [facility] - In-pt  Chief Complaint: Metastatic prostate cancer, obstructive uropathy   Subjective: Patient without new complaints Says bags get full every day  Allergies: Ibuprofen and Shrimp [shellfish allergy]  Medications:  Current Facility-Administered Medications:    amLODipine (NORVASC) tablet 10 mg, 10 mg, Oral, Daily, Darrick Meigs, Gagan S, MD, 10 mg at 10/04/20 1208   bisacodyl (DULCOLAX) suppository 10 mg, 10 mg, Rectal, Once, Rai, Ripudeep K, MD   Chlorhexidine Gluconate Cloth 2 % PADS 6 each, 6 each, Topical, Daily, Darrick Meigs, Marge Duncans, MD, 6 each at 10/04/20 1210   enoxaparin (LOVENOX) injection 40 mg, 40 mg, Subcutaneous, Q24H, Rai, Ripudeep K, MD, 40 mg at 10/04/20 1210   gabapentin (NEURONTIN) capsule 100 mg, 100 mg, Oral, BID, Rai, Ripudeep K, MD, 100 mg at 10/04/20 1210   Glycerin (Adult) 2.1 g suppository 1 suppository, 1 suppository, Rectal, PRN, Jonelle Sidle, Mohammad L, MD   HYDROmorphone (DILAUDID) injection 2 mg, 2 mg, Intravenous, Q2H PRN, Gala Romney L, MD, 2 mg at 10/03/20 2311   labetalol (NORMODYNE) injection 5 mg, 5 mg, Intravenous, Q2H PRN, Gala Romney L, MD, 5 mg at 09/29/20 0456   lactated ringers infusion, , Intravenous, Continuous, Elwyn Reach, MD, Last Rate: 100 mL/hr at 10/04/20 0458, New Bag at 10/04/20 0458   morphine (MS CONTIN) 12 hr tablet 60 mg, 60 mg, Oral, Q8H, Garba, Mohammad L, MD, 60 mg at 10/04/20 0801   naloxegol oxalate (MOVANTIK) tablet 12.5 mg, 12.5 mg, Oral, Daily, Rai, Ripudeep K, MD, 12.5 mg at 10/04/20 1055   ondansetron (ZOFRAN) tablet 4 mg, 4 mg, Oral, Q6H PRN **OR** ondansetron (ZOFRAN) injection 4 mg, 4 mg, Intravenous, Q6H PRN, Jonelle Sidle, Mohammad L, MD, 4 mg at 10/03/20 2209   polyethylene glycol (MIRALAX / GLYCOLAX) packet 17 g, 17 g, Oral, BID, Rai, Ripudeep K, MD, 17 g at 10/04/20 1210   predniSONE (DELTASONE) tablet 5 mg, 5 mg, Oral, Q  breakfast, Darrick Meigs, Gagan S, MD, 5 mg at 10/04/20 0845   senna-docusate (Senokot-S) tablet 2 tablet, 2 tablet, Oral, BID, Darrick Meigs, Marge Duncans, MD, 2 tablet at 10/04/20 1209   sorbitol, milk of mag, mineral oil, glycerin (SMOG) enema, 960 mL, Rectal, Once, Rai, Ripudeep K, MD  Facility-Administered Medications Ordered in Other Encounters:    heparin lock flush 100 unit/mL, 500 Units, Intravenous, Once, Shadad, Mathis Dad, MD   sodium chloride flush (NS) 0.9 % injection 10 mL, 10 mL, Intravenous, PRN, [name], Mathis Dad, MD    Vital Signs: BP (!) 144/80 (BP Location: Left Arm)   Pulse 68   Temp 98.4 F (36.9 C) (Oral)   Resp 14   Ht 5\' 8"  (1.727 m)   Wt 95.6 kg   SpO2 100%   BMI 32.05 kg/m   Physical Exam awake, alert.  Bilateral PCN intact, dressings clean and dry, insertion sites NT Output >2L from each  Imaging: No results found.  Labs:  CBC: Recent Labs    09/17/20 1050 09/28/20 1757 09/29/20 0425 09/30/20 0446  WBC 7.4 6.3 7.9 7.0  HGB 8.2* 7.9* 8.7* 8.1*  HCT 26.4* 26.1* 28.8* 26.6*  PLT 306 286 317 260     COAGS: Recent Labs    10/31/19 1139  INR 1.0     BMP: Recent Labs    10/31/19 1139 11/29/19 1306 01/12/20 0852 10/01/20 0540 10/02/20 0511 10/03/20 0530 10/04/20 0456  NA 142 142   < > 142 145  140 139  K 3.6 3.5   < > 3.8 3.6 3.4* 3.3*  CL 103 106   < > 103 107 104 103  CO2 32 30   < > 29 31 29 30   GLUCOSE 86 121*   < > 94 113* 89 94  BUN 15 16   < > 64* 50* 33* 24*  CALCIUM 9.2 10.1   < > 8.6* 8.9 8.9 8.7*  CREATININE 0.82 1.35*   < > 4.33* 3.13* 2.15* 1.65*  GFRNONAA >60 53*   < > 14* 21* 32* 44*  GFRAA >60 >60  --   --   --   --   --    < > = values in this interval not displayed.     LIVER FUNCTION TESTS: Recent Labs    06/18/20 1251 07/18/20 1143 09/17/20 1050 09/29/20 0425  BILITOT 0.3 <0.2* 0.2* 0.5  AST 16 19 15 20   ALT 10 12 7 11   ALKPHOS 75 79 93 81  PROT 8.0 7.5 7.5 8.0  ALBUMIN 2.7* 2.9* 2.6* 3.1*     Assessment and  Plan: Patient with history of metastatic prostate cancer with obstructive uropathy/AKI; status post bilateral nephrostomies on 6/26; afebrile, creatinine 1.65, continues to trend down. Further plans as per urology-(subsequent attempt at antegrade ureteral stent placement can then be made once AKI resolves   Electronically Signed: Ascencion Dike, PA-C 10/04/2020, 1:27 PM   I spent a total of 15 Minutes at the the patient's bedside AND on the patient's hospital floor or unit, greater than 50% of which was counseling/coordinating care for bilateral nephrostomies

## 2020-10-04 NOTE — Care Management Important Message (Signed)
Important Message  Patient Details IM Letter given to the Patient. Name: Andre Glass MRN: [id number] Date of Birth: 09/21/1950   Medicare Important Message Given:  Yes     Kerin Salen 10/04/2020, 12:02 PM

## 2020-10-04 NOTE — Progress Notes (Signed)
Quick check of right PCN after RN reported UOP changed from clear yellow to blood tinged. (R)PCN remains intact, pink blood tinged urine and small clot now in bag. Probably passed old clot from the time of placement from collecting system. Reassured pt this may happen and that blood tinged urine in bag can happen. As long as UOP remains thin and consistent, no need to worry. Also reviewed bag emptying teaching. He should not need to flush as ling as thin UOP from drains. We will coordinate outpt follow up with Urology team.  Ascencion Dike PA-C Interventional Radiology 10/04/2020 3:10 PM

## 2020-10-04 NOTE — Progress Notes (Signed)
Urology Inpatient Progress Report  Primary hypertension [I10] Prostate cancer ([place]) [C61] Drug-induced constipation [K59.03] Obstructive uropathy [N13.9] AKI (acute kidney injury) ([place]) [N17.9]        Intv/Subj: No acute events overnight. Patient is without complaint. Creatinine continues to stabilize.  Most recent creatinine 1.65.  Excellent output from nephrostomy tubes.  He was noted to have some blood-tinged urine.  This was evaluated by interventional radiology.  Continues to drain well.  Principal Problem:   Obstructive uropathy Active Problems:   Metastatic castration-resistant adenocarcinoma of prostate ([place])   Drug induced constipation   Intractable pain   AKI (acute kidney injury) ([place])   Hydronephrosis  Current Facility-Administered Medications  Medication Dose Route Frequency Provider Last Rate Last Admin   acetaminophen (TYLENOL) tablet 650 mg  650 mg Oral Q6H PRN Rai, Ripudeep K, MD       amLODipine (NORVASC) tablet 10 mg  10 mg Oral Daily Darrick Meigs, Gagan S, MD   10 mg at 10/04/20 1208   bisacodyl (DULCOLAX) suppository 10 mg  10 mg Rectal Once Rai, Ripudeep K, MD       Chlorhexidine Gluconate Cloth 2 % PADS 6 each  6 each Topical Daily Oswald Hillock, MD   6 each at 10/04/20 1210   enoxaparin (LOVENOX) injection 40 mg  40 mg Subcutaneous Q24H Rai, Ripudeep K, MD   40 mg at 10/04/20 1210   gabapentin (NEURONTIN) capsule 200 mg  200 mg Oral BID Rai, Ripudeep K, MD       Glycerin (Adult) 2.1 g suppository 1 suppository  1 suppository Rectal PRN Elwyn Reach, MD       HYDROmorphone (DILAUDID) injection 2 mg  2 mg Intravenous Q2H PRN Gala Romney L, MD   2 mg at 10/03/20 2311   labetalol (NORMODYNE) injection 5 mg  5 mg Intravenous Q2H PRN Elwyn Reach, MD   5 mg at 09/29/20 0456   lactated ringers infusion   Intravenous Continuous Elwyn Reach, MD 100 mL/hr at 10/04/20 1614 New Bag at 10/04/20 1614   morphine (MS CONTIN) 12 hr tablet 60 mg  60 mg Oral  Q8H Garba, Mohammad L, MD   60 mg at 10/04/20 1804   naloxegol oxalate (MOVANTIK) tablet 12.5 mg  12.5 mg Oral Daily Rai, Ripudeep K, MD   12.5 mg at 10/04/20 1055   ondansetron (ZOFRAN) tablet 4 mg  4 mg Oral Q6H PRN Elwyn Reach, MD       Or   ondansetron (ZOFRAN) injection 4 mg  4 mg Intravenous Q6H PRN Gala Romney L, MD   4 mg at 10/03/20 2209   polyethylene glycol (MIRALAX / GLYCOLAX) packet 17 g  17 g Oral BID Rai, Ripudeep K, MD   17 g at 10/04/20 1210   predniSONE (DELTASONE) tablet 5 mg  5 mg Oral Q breakfast Oswald Hillock, MD   5 mg at 10/04/20 0845   senna-docusate (Senokot-S) tablet 2 tablet  2 tablet Oral BID Oswald Hillock, MD   2 tablet at 10/04/20 1209   Facility-Administered Medications Ordered in Other Encounters  Medication Dose Route Frequency Provider Last Rate Last Admin   heparin lock flush 100 unit/mL  500 Units Intravenous Once [name], MD       sodium chloride flush (NS) 0.9 % injection 10 mL  10 mL Intravenous PRN [name], MD         Objective: Vital: Vitals:   10/03/20 0500 10/03/20 1403 10/03/20  1745 10/03/20 2118  BP: (!) 183/86 (!) 189/89 (!) 157/78 (!) 144/80  Pulse: 76 95 78 68  Resp: 18 16  14   Temp: 98.3 F (36.8 C) 98.6 F (37 C)  98.4 F (36.9 C)  TempSrc: Oral Oral  Oral  SpO2: 99% 99%  100%  Weight:      Height:       I/Os: I/O last 3 completed shifts: In: 480 [P.O.:480] Out: 7025 [Urine:7025]  Physical Exam:  General: Patient is in no apparent distress Lungs: Normal respiratory effort, chest expands symmetrically. GI: The abdomen is soft and nontender without mass. Bilateral nephrostomy tubes draining slightly blood-tinged urine Ext: lower extremities symmetric  Lab Results: No results for input(s): WBC, HGB, HCT in the last 72 hours. Recent Labs    10/02/20 0511 10/03/20 0530 10/04/20 0456  NA 145 140 139  K 3.6 3.4* 3.3*  CL 107 104 103  CO2 31 29 30   GLUCOSE 113* 89 94  BUN 50* 33* 24*   CREATININE 3.13* 2.15* 1.65*  CALCIUM 8.9 8.9 8.7*   No results for input(s): LABPT, INR in the last 72 hours. No results for input(s): LABURIN in the last 72 hours. Results for orders placed or performed during the hospital encounter of 09/28/20  Resp Panel by RT-PCR (Flu A&B, Covid) Nasopharyngeal Swab     Status: None   Collection Time: 09/28/20  7:52 PM   Specimen: Nasopharyngeal Swab; Nasopharyngeal(NP) swabs in vial transport medium  Result Value Ref Range Status   SARS Coronavirus 2 by RT PCR NEGATIVE NEGATIVE Final    Comment: (NOTE) SARS-CoV-2 target nucleic acids are NOT DETECTED.  The SARS-CoV-2 RNA is generally detectable in upper respiratory specimens during the acute phase of infection. The lowest concentration of SARS-CoV-2 viral copies this assay can detect is 138 copies/mL. A negative result does not preclude SARS-Cov-2 infection and should not be used as the sole basis for treatment or other patient management decisions. A negative result may occur with  improper specimen collection/handling, submission of specimen other than nasopharyngeal swab, presence of viral mutation(s) within the areas targeted by this assay, and inadequate number of viral copies(<138 copies/mL). A negative result must be combined with clinical observations, patient history, and epidemiological information. The expected result is Negative.  Fact Sheet for Patients:  EntrepreneurPulse.com.au  Fact Sheet for Healthcare Providers:  IncredibleEmployment.be  This test is no t yet approved or cleared by the [place] FDA and  has been authorized for detection and/or diagnosis of SARS-CoV-2 by FDA under an Emergency Use Authorization (EUA). This EUA will remain  in effect (meaning this test can be used) for the duration of the COVID-19 declaration under Section 564(b)(1) of the Act, 21 U.S.C.section 360bbb-3(b)(1), unless the authorization is terminated   or revoked sooner.       Influenza A by PCR NEGATIVE NEGATIVE Final   Influenza B by PCR NEGATIVE NEGATIVE Final    Comment: (NOTE) The Xpert Xpress SARS-CoV-2/FLU/RSV plus assay is intended as an aid in the diagnosis of influenza from Nasopharyngeal swab specimens and should not be used as a sole basis for treatment. Nasal washings and aspirates are unacceptable for Xpert Xpress SARS-CoV-2/FLU/RSV testing.  Fact Sheet for Patients: EntrepreneurPulse.com.au  Fact Sheet for Healthcare Providers: IncredibleEmployment.be  This test is not yet approved or cleared by the [place] FDA and has been authorized for detection and/or diagnosis of SARS-CoV-2 by FDA under an Emergency Use Authorization (EUA). This EUA will remain in effect (meaning  this test can be used) for the duration of the COVID-19 declaration under Section 564(b)(1) of the Act, 21 U.S.C. section 360bbb-3(b)(1), unless the authorization is terminated or revoked.  Performed at [facility], [place] [address], [place], [place] [address]   Urine culture     Status: None   Collection Time: 09/29/20 11:28 AM   Specimen: Urine, Clean Catch  Result Value Ref Range Status   Specimen Description   Final    URINE, CLEAN CATCH Performed at [facility], [place] [address], [place], [place] [address]    Special Requests   Final    NONE Performed at [facility], [place] [address] [place], [place] [address]    Culture   Final    NO GROWTH Performed at [facility], [place] [address], [place], [place] [address]    Report Status 10/01/2020 FINAL  Final    Studies/Results: No results found.  Assessment: Bilateral hydronephrosis secondary to extrinsic compression from malignancy Castrate resistant metastatic prostate cancer  Plan: Continue nephrostomy tubes.  I spoke with Dr. Ronny Bacon with interventional radiology about the  case.  Recommendation is to bring him back as an outpatient in about 4 weeks or so which will allow for nephrostomy tube tract maturation and optimal decompression to increase the chances of success in placing bilateral ureteral stents.  Once deemed appropriate by the medicine team, he can be discharged with bilateral nephrostomy tubes.   Link Snuffer, MD Urology 10/04/2020, 6:26 PM

## 2020-10-04 NOTE — Progress Notes (Addendum)
[facility]                                                                              Patient Demographics  Andre Glass, is a 70 y.o. male, DOB - [birth year], VCB:[id number]  Admit date - 09/28/2020   Admitting Physician Elwyn Reach, MD  Outpatient Primary MD for the patient is [name], DO  Outpatient specialists:   LOS - 6  days   Medical records reviewed and are as summarized below:    Chief Complaint  Patient presents with   Leg Swelling   Leg Pain   Constipation       Brief summary   70 year old male with medical history of metastatic prostate cancer, locally invasive, anemia of chronic disease, history of kidney stones, recurrent UTI, recurrent fecal impaction with narcotics came to hospital with complaints of intractable pain in the pelvic area and back requiring more narcotics for pain control as well as constipation.  In the ED he was disimpacted.  Constipation is believed to be due to narcotics.  He was also found to have FOBT positive with hemoglobin less than 8.  As part of work-up he was found to have locally invasive cancer on the CT scan abdomen/pelvis with bilateral hydronephrosis.  Urology was consulted.   CT renal stone study shows locally invasive prostate cancer with multiple lobulated masses in the base of the bladder and anterior wall of the rectum.  This has led to a ureterovesicular junction obstruction with bilateral progressing moderate bilateral hydronephrosis.  Urology consulted and patient being admitted to the hospital for further evaluation and treatment   IR was consulted and patient underwent percutaneous nephrostomy tube placement for obstructive uropathy/bilateral hydronephrosis.   Assessment & Plan    Principal Problem:   Obstructive uropathy with bilateral hydronephrosis -CTAP showed lobulated masses from prostate CA obstructing the ureterovesical junction causing bilateral hydronephrosis  -Urology consulted,  status post percutaneous nephrostomy tube placement by IR -Per urology will need ureteral stent placement once hydronephrosis is decompressed and renal function has improved -Creatinine continues to improve, 1.65 today   Active Problems: Acute kidney injury on CKD stage IIIa  -Baseline creatinine around 1.4, presented with creatinine of 4.4 likely due to #1 -Creatinine close to baseline, 1.65 today  Constipation -Likely due to opioid/pain management, status post disimpaction in ED -Patient has received MiraLAX, Dulcolax, Senokot, Movantik, still no BM - will give smog enema    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   Weight as of this encounter: 95.6 kg.  Code Status: Full CODE STATUS DVT Prophylaxis:  enoxaparin (LOVENOX) injection 40 mg Start: 10/04/20 1000  Level of Care: Level of care: Med-Surg Family Communication: Discussed all imaging results, lab results, explained to the patient and his brother at the bedside today   Disposition Plan:     Status is: Inpatient  Remains inpatient appropriate because:Inpatient level of care appropriate due to severity of illness  Dispo: The patient is from: Home              Anticipated d/c is to: Home              Patient currently is not medically stable to d/c.,  Hopefully DC home in a.m, awaiting BM.  Will request IR for the nephrostomy tube care  teaching   Difficult to place patient No      Time Spent in minutes 25 minutes  Procedures:  Bilateral PCN placement  Consultants:   Urology Interventional radiology  Antimicrobials:   Anti-infectives (From admission, onward)    None          Medications  Scheduled Meds:  amLODipine  10 mg Oral Daily   bisacodyl  10 mg Rectal Once   Chlorhexidine Gluconate Cloth  6 each Topical Daily   enoxaparin (LOVENOX) injection  40 mg Subcutaneous Q24H   gabapentin  100 mg Oral BID   morphine  60 mg Oral Q8H   naloxegol oxalate  12.5 mg Oral Daily   polyethylene glycol  17 g Oral BID   predniSONE  5 mg Oral Q breakfast   senna-docusate  2 tablet Oral BID   Continuous Infusions:  lactated ringers 100 mL/hr at 10/04/20 0458   PRN Meds:.Glycerin (Adult), HYDROmorphone (DILAUDID) injection, labetalol, ondansetron **OR** ondansetron (ZOFRAN) IV      Subjective:   Andre Glass was seen and examined today.  States still no BM, otherwise no abdominal pain, nausea or vomiting.  Passing flatus.  Patient denies dizziness, chest pain, shortness of breath, abdominal pain, N/V.  No acute events overnight.  No fevers  Objective:   Vitals:   10/03/20 0500 10/03/20 1403 10/03/20 1745 10/03/20 2118  BP: (!) 183/86 (!) 189/89 (!) 157/78 (!) 144/80  Pulse: 76 95 78 68  Resp: 18 16  14   Temp: 98.3 F (36.8 C) 98.6 F (37 C)  98.4 F (36.9 C)  TempSrc: Oral Oral  Oral  SpO2: 99% 99%  100%  Weight:      Height:        Intake/Output Summary (Last 24 hours) at 10/04/2020 1415 Last data filed at 10/04/2020 1100 Gross per 24 hour  Intake 240 ml  Output 3475 ml  Net -3235 ml     Wt Readings from Last 3 Encounters:  09/29/20 95.6 kg  09/24/20 95.3 kg  09/11/20 91.6 kg   Physical Exam General: Alert and oriented x 3, NAD Cardiovascular: S1 S2 clear, RRR. No pedal edema b/l Respiratory: Fairly CTA B Gastrointestinal: Soft, nontender, nondistended, NBS Ext: no pedal edema  bilaterally Neuro: no new deficits GU: Bilateral nephrostomy tubes Psych: Normal affect and demeanor, alert and oriented x3    Data Reviewed:  I have personally reviewed following labs and imaging studies  Micro Results Recent Results (from the past 240 hour(s))  Resp Panel by RT-PCR (Flu A&B, Covid) Nasopharyngeal Swab     Status: None   Collection Time: 09/28/20  7:52 PM   Specimen: Nasopharyngeal Swab; Nasopharyngeal(NP) swabs in vial transport medium  Result Value Ref Range Status   SARS Coronavirus 2 by RT PCR NEGATIVE NEGATIVE Final    Comment: (NOTE) SARS-CoV-2  target nucleic acids are NOT DETECTED.  The SARS-CoV-2 RNA is generally detectable in upper respiratory specimens during the acute phase of infection. The lowest concentration of SARS-CoV-2 viral copies this assay can detect is 138 copies/mL. A negative result does not preclude SARS-Cov-2 infection and should not be used as the sole basis for treatment or other patient management decisions. A negative result may occur with  improper specimen collection/handling, submission of specimen other than nasopharyngeal swab, presence of viral mutation(s) within the areas targeted by this assay, and inadequate number of viral copies(<138 copies/mL). A negative result must be combined with clinical observations, patient history, and epidemiological information. The expected result is Negative.  Fact Sheet for Patients:  EntrepreneurPulse.com.au  Fact Sheet for Healthcare Providers:  IncredibleEmployment.be  This test is no t yet approved or cleared by the [place] FDA and  has been authorized for detection and/or diagnosis of SARS-CoV-2 by FDA under an Emergency Use Authorization (EUA). This EUA will remain  in effect (meaning this test can be used) for the duration of the COVID-19 declaration under Section 564(b)(1) of the Act, 21 U.S.C.section 360bbb-3(b)(1), unless the  authorization is terminated  or revoked sooner.       Influenza A by PCR NEGATIVE NEGATIVE Final   Influenza B by PCR NEGATIVE NEGATIVE Final    Comment: (NOTE) The Xpert Xpress SARS-CoV-2/FLU/RSV plus assay is intended as an aid in the diagnosis of influenza from Nasopharyngeal swab specimens and should not be used as a sole basis for treatment. Nasal washings and aspirates are unacceptable for Xpert Xpress SARS-CoV-2/FLU/RSV testing.  Fact Sheet for Patients: EntrepreneurPulse.com.au  Fact Sheet for Healthcare Providers: IncredibleEmployment.be  This test is not yet approved or cleared by the [place] FDA and has been authorized for detection and/or diagnosis of SARS-CoV-2 by FDA under an Emergency Use Authorization (EUA). This EUA will remain in effect (meaning this test can be used) for the duration of the COVID-19 declaration under Section 564(b)(1) of the Act, 21 U.S.C. section 360bbb-3(b)(1), unless the authorization is terminated or revoked.  Performed at [facility], [place] [address] [place], [place] [address]   Urine culture     Status: None   Collection Time: 09/29/20 11:28 AM   Specimen: Urine, Clean Catch  Result Value Ref Range Status   Specimen Description   Final    URINE, CLEAN CATCH Performed at [facility], [place] [address] [place], [place] [address]    Special Requests   Final    NONE Performed at [facility], [place] [address] [place] [address], [place], [place] [address]    Culture   Final    NO GROWTH Performed at [facility], [place] [address], [place], [place] [address]    Report Status 10/01/2020 FINAL  Final    Radiology Reports CT Renal Stone Study  Result Date: 09/28/2020 CLINICAL DATA:  Acute renal failure, right hip pain EXAM: CT ABDOMEN AND PELVIS WITHOUT CONTRAST TECHNIQUE: Multidetector CT imaging of the abdomen and pelvis was performed following the  standard protocol without IV contrast. COMPARISON:  06/14/2020 FINDINGS: Lower chest: Visualized lung bases are clear. The visualized heart and pericardium are unremarkable. Hepatobiliary: No focal liver abnormality is seen. No gallstones, gallbladder wall thickening, or biliary dilatation. Pancreas: Unremarkable Spleen: Unremarkable Adrenals/Urinary Tract: Multiple lobulated mural masses are again identified within the base of the bladder which appear confluent with the dome of the prostate gland. There is, additionally, confluent soft tissue with the prostate  gland resulting in thickening of the anterior wall of the rectum, best seen on axial image # 82/2. Altogether, these are in keeping with a locally invasive prostate malignancy with invasion of the base of the bladder as well as the anterior wall the rectum. This appears similar to that seen on prior examination though exact measurement is difficult given the irregular contour of the mass and lack of contrast administration. There is resultant progressive, moderate bilateral hydronephrosis with obstruction of the ureterovesicular junctions bilaterally by the lobulated mass. Renal cortical thickness has been preserved. The kidneys are normal in size and position. No superimposed intrarenal masses or calcifications are seen. The adrenal glands are unremarkable. Stomach/Bowel: Aside from the eccentric mural mass involving the anterior wall of the rectum, the stomach, small bowel, and large bowel are unremarkable. No evidence of obstruction or focal inflammation. Appendix normal. No free intraperitoneal gas or fluid. Vascular/Lymphatic: There is progressive retroperitoneal and pelvic adenopathy. By example a new periaortic lymph node has developed at axial image # 46/2 measuring 12 mm x 22 mm. Pathologic adenopathy is now seen within the left periaortic, bilateral common iliac, external iliac, and perirectal lymph node groups. Presacral edema appears similar. Mild  aortoiliac atherosclerotic calcification is present. No aortic aneurysm. Reproductive: As noted above, the prostate gland is confluent with multiple lobulated soft tissue mass is invading the base of the bladder and anterior wall of the rectum in keeping with a primary prostatic malignancy. Other: Increasing anasarca with subcutaneous edema involving the body wall. Musculoskeletal: Multiple sclerotic metastases are again seen throughout the visualized axial skeleton which appears slightly progressive since prior examination. By example a. sclerotic metastasis involving the L4 vertebral body has slightly increased in size, best appreciated on sagittal reformats. No pathologic fracture IMPRESSION: Locally invasive prostate malignancy with multiple lobulated masses involving the base of the bladder as well as the anterior wall of the rectum. Resultant obstruction of the ureterovesicular junctions bilaterally with progressive, moderate bilateral hydronephrosis. Preserved renal parenchymal volume. Interval disease progression with development of extensive retroperitoneal and pelvic adenopathy as well as slight interval progression of widespread sclerotic osseous metastases. Electronically Signed   By: Fidela Salisbury MD   On: 09/28/2020 22:21   DG Hip Unilat With Pelvis 2-3 Views Right  Result Date: 09/28/2020 CLINICAL DATA:  Status post fall 3 days ago with right hip pain. History of prostate cancer. EXAM: DG HIP (WITH OR WITHOUT PELVIS) 2-3V RIGHT COMPARISON:  Abdomen and pelvis CT, dated June 14, 2020 FINDINGS: There is no evidence of an acute hip fracture or dislocation. Degenerative changes are seen involving both hips in the form of joint space narrowing and acetabular sclerosis. Ill-defined sclerotic changes are seen involving the medial aspects of the bilateral inferior pubic rami and lateral aspect of the lower right iliac bone. Soft tissue structures are unremarkable. IMPRESSION: 1. No acute osseous  abnormality. 2. Sclerotic changes within the pelvis, as described above, consistent with patient's known history of osseous metastasis. Electronically Signed   By: Virgina Norfolk M.D.   On: 09/28/2020 18:52   IR NEPHROSTOMY PLACEMENT LEFT  Result Date: 09/29/2020 INDICATION: 70 year old male with prostate carcinoma, hydronephrosis and acute renal failure referred for bilateral percutaneous nephrostomy. EXAM: IR NEPHROSTOMY PLACEMENT LEFT; IR NEPHROSTOMY PLACEMENT RIGHT COMPARISON:  CT 09/28/2020 MEDICATIONS: None ANESTHESIA/SEDATION: Fentanyl 50 mcg IV; Versed 1.0 mg IV Moderate Sedation Time:  15 minutes The patient was continuously monitored during the procedure by the interventional radiology nurse under my direct supervision. CONTRAST:  99mL  OMNIPAQUE IOHEXOL 300 MG/ML SOLN - administered into the collecting system(s) FLUOROSCOPY TIME:  Fluoroscopy Time: 1 minutes 48 seconds (23 mGy). COMPLICATIONS: None PROCEDURE: Informed written consent was obtained from the patient after a thorough discussion of the procedural risks, benefits and alternatives. All questions were addressed. Maximal Sterile Barrier Technique was utilized including caps, mask, sterile gowns, sterile gloves, sterile drape, hand hygiene and skin antiseptic. A timeout was performed prior to the initiation of the procedure. Patient positioned prone position on the fluoroscopy table. Ultrasound survey of the bilateral flank was performed with images stored and sent to PACs. Left: The patient was then prepped and draped in the usual sterile fashion. 1% lidocaine was used to anesthetize the skin and subcutaneous tissues for local anesthesia. A Chiba needle was then used to access a posterior inferior calyx of the right kidney with ultrasound guidance. With spontaneous urine returned through the needle, passage of an 018 micro wire into the collecting system was performed under fluoroscopy. A small incision was made with an 11 blade scalpel, and  the needle was removed from the wire. An Accustick system was then advanced over the wire into the collecting system under fluoroscopy. The metal stiffener and inner dilator were removed, and then a sample of fluid was aspirated through the 4 French outer sheath. Bentson wire was passed into the collecting system and the sheath removed. Ten French dilation of the soft tissues was performed. Using modified Seldinger technique, a 10 French pigtail catheter drain was placed over the Bentson wire. Wire and inner stiffener removed, and the pigtail was formed in the collecting system. Small amount of contrast confirmed position of the catheter. Right: 1% lidocaine was used to anesthetize the skin and subcutaneous tissues for local anesthesia. A Chiba needle was then used to access a posterior inferior calyx of the left kidney with ultrasound guidance. With spontaneous urine returned through the needle, passage of an 018 micro wire into the collecting system was performed under fluoroscopy. A small incision was made with an 11 blade scalpel, and the needle was removed from the wire. An Accustick system was then advanced over the wire into the collecting system under fluoroscopy. The metal stiffener and inner dilator were removed, and then a sample of fluid was aspirated through the 4 French outer sheath. Bentson wire was passed into the collecting system and the sheath removed. Ten French dilation of the soft tissues was performed. Using modified Seldinger technique, a 10 French pigtail catheter drain was placed over the Bentson wire. Wire and inner stiffener removed, and the pigtail was formed in the collecting system. Small amount of contrast confirmed position of the catheter. Patient tolerated the procedure well and remained hemodynamically stable throughout. No complications were encountered and no significant blood loss encountered IMPRESSION: Status post image guided bilateral percutaneous nephrostomy Signed, Dulcy Fanny. Dellia Nims, RPVI [facility] Radiology Electronically Signed   By: Corrie Mckusick D.O.   On: 09/29/2020 14:45   IR NEPHROSTOMY PLACEMENT RIGHT  Result Date: 09/29/2020 INDICATION: 70 year old male with prostate carcinoma, hydronephrosis and acute renal failure referred for bilateral percutaneous nephrostomy. EXAM: IR NEPHROSTOMY PLACEMENT LEFT; IR NEPHROSTOMY PLACEMENT RIGHT COMPARISON:  CT 09/28/2020 MEDICATIONS: None ANESTHESIA/SEDATION: Fentanyl 50 mcg IV; Versed 1.0 mg IV Moderate Sedation Time:  15 minutes The patient was continuously monitored during the procedure by the interventional radiology nurse under my direct supervision. CONTRAST:  64mL OMNIPAQUE IOHEXOL 300 MG/ML SOLN - administered into the collecting system(s) FLUOROSCOPY TIME:  Fluoroscopy Time: 1 minutes 48 seconds (23 mGy). COMPLICATIONS: None PROCEDURE: Informed written consent was obtained from the patient after a thorough discussion of the procedural risks, benefits and alternatives. All questions were addressed. Maximal Sterile Barrier Technique was utilized including caps, mask, sterile gowns, sterile gloves, sterile drape, hand hygiene and skin antiseptic. A timeout was performed prior to the initiation of the procedure. Patient positioned prone position on the fluoroscopy table. Ultrasound survey of the bilateral flank was performed with images stored and sent to PACs. Left: The patient was then prepped and draped in the usual sterile fashion. 1% lidocaine was used to anesthetize the skin and subcutaneous tissues for local anesthesia. A Chiba needle was then used to access a posterior inferior calyx of the right kidney with ultrasound guidance. With spontaneous urine returned through the needle, passage of an 018 micro wire into the collecting system was performed under fluoroscopy. A small incision was made with an 11 blade scalpel, and the needle was removed from the wire. An  Accustick system was then advanced over the wire into the collecting system under fluoroscopy. The metal stiffener and inner dilator were removed, and then a sample of fluid was aspirated through the 4 French outer sheath. Bentson wire was passed into the collecting system and the sheath removed. Ten French dilation of the soft tissues was performed. Using modified Seldinger technique, a 10 French pigtail catheter drain was placed over the Bentson wire. Wire and inner stiffener removed, and the pigtail was formed in the collecting system. Small amount of contrast confirmed position of the catheter. Right: 1% lidocaine was used to anesthetize the skin and subcutaneous tissues for local anesthesia. A Chiba needle was then used to access a posterior inferior calyx of the left kidney with ultrasound guidance. With spontaneous urine returned through the needle, passage of an 018 micro wire into the collecting system was performed under fluoroscopy. A small incision was made with an 11 blade scalpel, and the needle was removed from the wire. An Accustick system was then advanced over the wire into the collecting system under fluoroscopy. The metal stiffener and inner dilator were removed, and then a sample of fluid was aspirated through the 4 French outer sheath. Bentson wire was passed into the collecting system and the sheath removed. Ten French dilation of the soft tissues was performed. Using modified Seldinger technique, a 10 French pigtail catheter drain was placed over the Bentson wire. Wire and inner stiffener removed, and the pigtail was formed in the collecting system. Small amount of contrast confirmed position of the catheter. Patient tolerated the procedure well and remained hemodynamically stable throughout. No complications were encountered and no significant blood loss encountered IMPRESSION: Status post image guided bilateral percutaneous nephrostomy Signed, Dulcy Fanny. Dellia Nims, RPVI [facility] Radiology Electronically Signed   By: Corrie Mckusick D.O.   On: 09/29/2020 14:45   NM PET (F18-PYLARIFY) SKULL TO MID THIGH  Result Date: 09/19/2020 CLINICAL DATA:  Prostate cancer with bone metastasis. Prostate biopsied 03/09/2019. Chemotherapy in 06/24/2019. Radiation therapy in 2021. Recent PSA of 91.5. EXAM: NUCLEAR MEDICINE PET SKULL BASE TO THIGH TECHNIQUE: 9.6 mCi F18 Piflufolastat (Pylarify) was injected intravenously. Full-ring PET imaging was performed from the skull base to thigh after the radiotracer. CT data was obtained and used for attenuation correction and anatomic localization. COMPARISON:  Chest abdomen and pelvic CTs of 06/14/2020. FINDINGS: NECK Right low jugular/supraclavicular node measures 8 mm and a S.U.V. max of 10.0 on  61/4. Incidental CT finding: Bilateral carotid atherosclerosis. No other cervical adenopathy. CHEST No radiotracer accumulation within mediastinal or hilar lymph nodes. No tracer avid pulmonary nodules on the CT scan. Incidental CT finding: Right Port-A-Cath tip at low SVC. Aortic atherosclerosis. Right upper lobe 6 mm nodule on 30/8 is unchanged and below PET resolution. ABDOMEN/PELVIS The dominant pelvic mass, with extension into the urinary bladder is poorly evaluated secondary to physiologic bladder hypermetabolism. Measures on the order of 5.9 x 5.6 cm on 200/4. Corresponds to tracer affinity centered about the right side of the prostate including at a S.U.V. max of 15.4. Compare 5.3 x 5.5 cm on the prior exam (when remeasured). Tracer avid abdominal retroperitoneal nodes. Example left periaortic node of 1.5 cm and a S.U.V. max of 28.2 on 145/4. Compare 1.2 cm on 06/14/2020 (when remeasured). A node within the left common iliac station measures 1.1 cm and a S.U.V. max of 11.0 on 175/4. Compare 9 mm on the prior CT (when remeasured). Extensive hypermetabolic pelvic adenopathy. Left external iliac node measures 1.1 cm and  a S.U.V. max of 29.6 on 195/4. Newly enlarged since the prior CT. Tracer avid nodes surrounding pelvic bowel loops. Example anterior right pelvic node of 1.1 cm and a S.U.V. max of 28.8 on 188/4, new. A right perirectal node measures 8 mm and a S.U.V. max of 6.4 on 204/4, likely new since the prior CT. Incidental CT finding: Abdominal aortic atherosclerosis. Moderate right-sided hydroureteronephrosis is followed to the level of the ureterovesicular junction and the locally advanced prostate primary, similar. Interval development of mild to moderate left-sided hydronephrosis and mild left hydroureter, also followed to the level of the ureterovesicular junction. Normal adrenal glands. Colonic stool burden suggests constipation. SKELETON Extensive tracer avid osseous metastasis. Eccentric left coccygeal lesion measures a S.U.V. max of 19.2, including on 194/4. A left acetabular lesion measures a S.U.V. max of 23.7 on 193/4. L4 lesion measures a S.U.V. max of 39.5. IMPRESSION: 1. Locally advanced prostate primary with bladder extension. Given concurrent physiologic urinary tracer affinity, the primary is somewhat challenging to evaluate. Felt to be slightly enlarged compared to 06/14/2020. Persistent secondary right and new left sided hydroureteronephrosis. 2. Moderate abdominal and marked pelvic nodal metastasis, progressive since 06/14/2020. 3. Isolated right supraclavicular/low cervical tracer avid nodal metastasis. 4. Widespread osseous metastasis. 5. No change in 6 mm right upper lobe pulmonary nodule, below PET resolution. Electronically Signed   By: Abigail Miyamoto M.D.   On: 09/19/2020 12:30    Lab Data:  CBC: Recent Labs  Lab 09/28/20 1757 09/29/20 0425 09/30/20 0446  WBC 6.3 7.9 7.0  NEUTROABS 4.8  --   --   HGB 7.9* 8.7* 8.1*  HCT 26.1* 28.8* 26.6*  MCV 92.2 92.9 92.0  PLT 286 317 488   Basic Metabolic Panel: Recent Labs  Lab 09/30/20 0446 10/01/20 0540 10/02/20 0511 10/03/20 0530  10/04/20 0456  NA 140 142 145 140 139  K 4.7 3.8 3.6 3.4* 3.3*  CL 103 103 107 104 103  CO2 27 29 31 29 30   GLUCOSE 80 94 113* 89 94  BUN 73* 64* 50* 33* 24*  CREATININE 4.82* 4.33* 3.13* 2.15* 1.65*  CALCIUM 9.0 8.6* 8.9 8.9 8.7*  MG  --   --  2.0  --   --    GFR: Estimated Creatinine Clearance: 46.7 mL/min (A) (by C-G formula based on SCr of 1.65 mg/dL (H)). Liver Function Tests: Recent Labs  Lab 09/29/20 0425  AST 20  ALT 11  ALKPHOS 81  BILITOT 0.5  PROT 8.0  ALBUMIN 3.1*   No results for input(s): LIPASE, AMYLASE in the last 168 hours. No results for input(s): AMMONIA in the last 168 hours. Coagulation Profile: No results for input(s): INR, PROTIME in the last 168 hours. Cardiac Enzymes: No results for input(s): CKTOTAL, CKMB, CKMBINDEX, TROPONINI in the last 168 hours. BNP (last 3 results) No results for input(s): PROBNP in the last 8760 hours. HbA1C: No results for input(s): HGBA1C in the last 72 hours. CBG: No results for input(s): GLUCAP in the last 168 hours. Lipid Profile: No results for input(s): CHOL, HDL, LDLCALC, TRIG, CHOLHDL, LDLDIRECT in the last 72 hours. Thyroid Function Tests: No results for input(s): TSH, T4TOTAL, FREET4, T3FREE, THYROIDAB in the last 72 hours. Anemia Panel: No results for input(s): VITAMINB12, FOLATE, FERRITIN, TIBC, IRON, RETICCTPCT in the last 72 hours. Urine analysis:    Component Value Date/Time   COLORURINE YELLOW 09/29/2020 1128   APPEARANCEUR CLEAR 09/29/2020 1128   LABSPEC 1.010 09/29/2020 1128   PHURINE 7.5 09/29/2020 1128   GLUCOSEU NEGATIVE 09/29/2020 1128   HGBUR LARGE (A) 09/29/2020 1128   BILIRUBINUR NEGATIVE 09/29/2020 1128   BILIRUBINUR negative 09/14/2019 1105   KETONESUR NEGATIVE 09/29/2020 1128   PROTEINUR 100 (A) 09/29/2020 1128   UROBILINOGEN 0.2 09/14/2019 1105   NITRITE NEGATIVE 09/29/2020 [place] 09/29/2020 1128     Jasemine Nawaz M.D. [facility] 10/04/2020, 2:15  PM  Available via Epic secure chat 7am-7pm After 7 pm, please refer to night coverage provider listed on amion.

## 2020-10-04 NOTE — TOC Benefit Eligibility Note (Signed)
Patient Teacher, English as a foreign language completed.    The patient is currently admitted and upon discharge could be taking Movantik 12.5 mg tab.  The current 30 day co-pay is, $0.00.   The patient is insured through [place], [place] Patient Advocate Specialist [place] Team Direct Number: [phone]  Fax: [phone]

## 2020-10-05 LAB — BASIC METABOLIC PANEL
Anion gap: 7 (ref 5–15)
BUN: 21 mg/dL (ref 8–23)
CO2: 29 mmol/L (ref 22–32)
Calcium: 8.5 mg/dL — ABNORMAL LOW (ref 8.9–10.3)
Chloride: 104 mmol/L (ref 98–111)
Creatinine, Ser: 1.54 mg/dL — ABNORMAL HIGH (ref 0.61–1.24)
GFR, Estimated: 48 mL/min — ABNORMAL LOW (ref >60)
Glucose, Bld: 93 mg/dL (ref 70–99)
Potassium: 3.4 mmol/L — ABNORMAL LOW (ref 3.5–5.1)
Sodium: 140 mmol/L (ref 135–145)

## 2020-10-05 MED ORDER — GLYCERIN (ADULT) 2 G RE SUPP: 1.0000 | RECTAL | 0 refills | Status: DC | PRN

## 2020-10-05 MED ORDER — HEPARIN SOD (PORK) LOCK FLUSH 100 UNIT/ML IV SOLN
500.0000 [IU] | INTRAVENOUS | Status: DC | PRN
  Filled 2020-10-05: qty 5

## 2020-10-05 MED ORDER — GABAPENTIN 100 MG PO CAPS: 200.0000 mg | ORAL_CAPSULE | Freq: Two times a day (BID) | ORAL | 3 refills | Status: DC

## 2020-10-05 MED ORDER — NALOXEGOL OXALATE 12.5 MG PO TABS: 12.5000 mg | ORAL_TABLET | Freq: Every day | ORAL | 3 refills | Status: DC

## 2020-10-05 MED ORDER — LINACLOTIDE 145 MCG PO CAPS
290.0000 ug | ORAL_CAPSULE | Freq: Every day | ORAL | Status: DC
  Administered 2020-10-05: 290 ug via ORAL
  Filled 2020-10-05: qty 2

## 2020-10-05 MED ORDER — POTASSIUM CHLORIDE CRYS ER 20 MEQ PO TBCR
40.0000 meq | EXTENDED_RELEASE_TABLET | Freq: Once | ORAL | Status: AC
  Administered 2020-10-05: 40 meq via ORAL
  Filled 2020-10-05: qty 2

## 2020-10-05 MED ORDER — AMLODIPINE BESYLATE 10 MG PO TABS: 10.0000 mg | ORAL_TABLET | Freq: Every day | ORAL | 3 refills | Status: DC

## 2020-10-05 MED ORDER — LINACLOTIDE 290 MCG PO CAPS: 290.0000 ug | ORAL_CAPSULE | Freq: Every day | ORAL | 2 refills | Status: DC | PRN

## 2020-10-05 NOTE — Discharge Summary (Signed)
Physician Discharge Summary   Patient ID: QUENTION MCNEILL MRN: [id number] DOB/AGE: December 13, 1950 70 y.o.  Admit date: 09/28/2020 Discharge date: 10/05/2020  Primary Care Physician:  [name], DO   Recommendations for Outpatient Follow-up:  Follow up with PCP in 1-2 weeks Patient started on Movantik 12.5 mg daily for chronic severe opioid-induced constipation, Linzess 290 mg daily as needed for constipation  Home Health: At baseline Equipment/Devices: DME 3N1  Discharge Condition: stable  CODE STATUS: FULL  Diet recommendation: Regular diet   Discharge Diagnoses:     Obstructive uropathy with bilateral hydronephrosis  Status post percutaneous nephrostomy tube bilaterally  Opioid induced severe constipation  Intractable pain  Metastatic castration-resistant adenocarcinoma of prostate ([place])  AKI (acute kidney injury) ([place]) on CKD stage IIIa  Hypertension uncontrolled  Anemia of chronic disease secondary to CKD and malignancy Peripheral neuropathy Hypokalemia Obesity  Consults:  Urology Interventional radiology    Allergies:   Allergies  Allergen Reactions   Ibuprofen Anaphylaxis   Shrimp [Shellfish Allergy] Anaphylaxis     DISCHARGE MEDICATIONS: Allergies as of 10/05/2020       Reactions   Ibuprofen Anaphylaxis   Shrimp [shellfish Allergy] Anaphylaxis        Medication List     TAKE these medications    amLODipine 10 MG tablet Commonly known as: NORVASC Take 1 tablet (10 mg total) by mouth daily.   gabapentin 100 MG capsule Commonly known as: NEURONTIN Take 2 capsules (200 mg total) by mouth 2 (two) times daily.   glycerin adult 2 g suppository Place 1 suppository rectally as needed for constipation.   HYDROmorphone 4 MG tablet Commonly known as: Dilaudid Take 1 tablet (4 mg total) by mouth every 4 (four) hours as needed for severe pain.   lidocaine-prilocaine cream Commonly known as: EMLA Apply 1 application topically as needed. What  changed: reasons to take this   linaclotide 290 MCG Caps capsule Commonly known as: LINZESS Take 1 capsule (290 mcg total) by mouth daily as needed (constipation, take before breakfast).   morphine 60 MG 12 hr tablet Commonly known as: MS CONTIN Take 1 tablet (60 mg total) by mouth every 8 (eight) hours.   multivitamin with minerals Tabs tablet Take 1 tablet by mouth daily.   naloxegol oxalate 12.5 MG Tabs tablet Commonly known as: MOVANTIK Take 1 tablet (12.5 mg total) by mouth daily. Take it in the morning 1 hour before breakfast daily for constipation.   NONFORMULARY OR COMPOUNDED ITEM Apply 1-2 g topically 4 (four) times daily.   ondansetron 4 MG tablet Commonly known as: ZOFRAN Take 1 tablet (4 mg total) by mouth 3 (three) times daily as needed for nausea or vomiting.   polyethylene glycol 17 g packet Commonly known as: MiraLax Take 17 g by mouth 2 (two) times daily.   predniSONE 5 MG tablet Commonly known as: DELTASONE Take 1 tablet (5 mg total) by mouth daily with breakfast.   prochlorperazine 10 MG tablet Commonly known as: COMPAZINE Take 1 tablet (10 mg total) by mouth every 6 (six) hours as needed for nausea or vomiting.   senna 8.6 MG Tabs tablet Commonly known as: SENOKOT Take 2 tablets by mouth daily.   tamsulosin 0.4 MG Caps capsule Commonly known as: FLOMAX TAKE 1 CAPSULE (0.4 MG TOTAL) BY MOUTH DAILY AS NEEDED (TO HELP WITH URINATION). What changed: when to take this   Trulance 3 MG Tabs Generic drug: Plecanatide Take 3 mg by mouth daily.   vitamin C 500 MG  tablet Commonly known as: ASCORBIC ACID Take 1,000 mg by mouth daily.   Vitamin E 268 MG (400 UNIT) Chew Chew 1 tablet by mouth daily.               Durable Medical Equipment  (From admission, onward)           Start     Ordered   10/02/20 1232  For home use only DME 3 n 1  Once        10/02/20 1232             Brief H and P: For complete details please refer to  admission H and P, but in brief,  70 year old male with medical history of metastatic prostate cancer, locally invasive, anemia of chronic disease, history of kidney stones, recurrent UTI, recurrent fecal impaction with narcotics came to hospital with complaints of intractable pain in the pelvic area and back requiring more narcotics for pain control as well as constipation.  In the ED he was disimpacted.  Constipation is believed to be due to narcotics.  He was also found to have FOBT positive with hemoglobin less than 8.  As part of work-up he was found to have locally invasive cancer on the CT scan abdomen/pelvis with bilateral hydronephrosis.  Urology was consulted.   CT renal stone study shows locally invasive prostate cancer with multiple lobulated masses in the base of the bladder and anterior wall of the rectum.  This has led to a ureterovesicular junction obstruction with bilateral progressing moderate bilateral hydronephrosis.  Urology consulted and patient being admitted to the hospital for further evaluation and treatment.  IR was consulted and patient underwent percutaneous nephrostomy tube placement for obstructive uropathy/bilateral hydronephrosis.  Hospital Course:   Obstructive uropathy with bilateral hydronephrosis -CT abdomen pelvis showed lobulated masses from prostate CA obstructing the ureterovesical junction causing bilateral hydronephrosis -Urology was consulted, status post percutaneous nephrostomy tube placement by IR -Per urology will need ureteral stent placement once hydronephrosis is decompressed and renal function has improved, likely outpatient in 4 weeks  -Creatinine has continued to improve, 1.5 at the time of discharge  -Teaching for nephrostomy tube care was provided by IR.    Acute kidney injury on CKD stage IIIa -Baseline creatinine around 1.4, presented with creatinine of 4.4 likely due to #1 -Creatinine 1.5 close to baseline at the time of discharge.    Severe opioid induced constipation -Likely due to opioid/pain management, status post disimpaction in ED -Patient received MiraLAX, Dulcolax, Senokot, Movantik with no significant relief, received smog enema. -Placed on Movantik daily with Linzess 290 mg daily as needed for constipation.  Patient can continue over-the-counter stool softeners or laxative as needed     Metastatic castration-resistant adenocarcinoma of prostate ([place]), intractable pain -Castrate resistant, currently on cabizitaxel, outpatient follow-up by oncology -Continue MS Contin, added bowel regimen   Hypertension, uncontrolled -Continue amlodipine 10 mg daily   Anemia of chronic disease, malignancy -At baseline  ~8, hemoglobin at baseline 8.1   Neuropathy -Per patient he has neuropathy in both lower extremities and hands, - placed on Neurontin 200 mg twice daily   Hypokalemia Potassium 3.4, replaced prior to discharge.   Obesity Estimated body mass index is 32.05 kg/m as calculated from the following:   Height as of this encounter: 5\' 8"  (1.727 m).   Weight as of this encounter: 95.6 kg.   Day of Discharge S: Had BM yesterday, feels okay to go home today  BP (!) 153/70  Pulse 63   Temp 98.6 F (37 C) (Oral)   Resp 18   Ht 5\' 8"  (1.727 m)   Wt 95.6 kg   SpO2 100%   BMI 32.05 kg/m   Physical Exam: General: Alert and awake oriented x3 not in any acute distress. CVS: S1-S2 clear no murmur rubs or gallops Chest: clear to auscultation bilaterally, no wheezing rales or rhonchi Abdomen: soft nontender, nondistended, normal bowel sounds, bilateral nephrostomy tubes Extremities: no cyanosis, clubbing or edema noted bilaterally Neuro: no new deficits    Get Medicines reviewed and adjusted: Please take all your medications with you for your next visit with your Primary MD  Please request your Primary MD to go over all hospital tests and procedure/radiological results at the follow up. Please ask your  Primary MD to get all Hospital records sent to his/her office.  If you experience worsening of your admission symptoms, develop shortness of breath, life threatening emergency, suicidal or homicidal thoughts you must seek medical attention immediately by calling 911 or calling your MD immediately  if symptoms less severe.  You must read complete instructions/literature along with all the possible adverse reactions/side effects for all the Medicines you take and that have been prescribed to you. Take any new Medicines after you have completely understood and accept all the possible adverse reactions/side effects.   Do not drive when taking pain medications.   Do not take more than prescribed Pain, Sleep and Anxiety Medications  Special Instructions: If you have smoked or chewed Tobacco  in the last 2 yrs please stop smoking, stop any regular Alcohol  and or any Recreational drug use.  Wear Seat belts while driving.  Please note  You were cared for by a hospitalist during your hospital stay. Once you are discharged, your primary care physician will handle any further medical issues. Please note that NO REFILLS for any discharge medications will be authorized once you are discharged, as it is imperative that you return to your primary care physician (or establish a relationship with a primary care physician if you do not have one) for your aftercare needs so that they can reassess your need for medications and monitor your lab values.   The results of significant diagnostics from this hospitalization (including imaging, microbiology, ancillary and laboratory) are listed below for reference.      Procedures/Studies:  CT Renal Stone Study  Result Date: 09/28/2020 CLINICAL DATA:  Acute renal failure, right hip pain EXAM: CT ABDOMEN AND PELVIS WITHOUT CONTRAST TECHNIQUE: Multidetector CT imaging of the abdomen and pelvis was performed following the standard protocol without IV contrast.  COMPARISON:  06/14/2020 FINDINGS: Lower chest: Visualized lung bases are clear. The visualized heart and pericardium are unremarkable. Hepatobiliary: No focal liver abnormality is seen. No gallstones, gallbladder wall thickening, or biliary dilatation. Pancreas: Unremarkable Spleen: Unremarkable Adrenals/Urinary Tract: Multiple lobulated mural masses are again identified within the base of the bladder which appear confluent with the dome of the prostate gland. There is, additionally, confluent soft tissue with the prostate gland resulting in thickening of the anterior wall of the rectum, best seen on axial image # 82/2. Altogether, these are in keeping with a locally invasive prostate malignancy with invasion of the base of the bladder as well as the anterior wall the rectum. This appears similar to that seen on prior examination though exact measurement is difficult given the irregular contour of the mass and lack of contrast administration. There is resultant progressive, moderate bilateral hydronephrosis with obstruction  of the ureterovesicular junctions bilaterally by the lobulated mass. Renal cortical thickness has been preserved. The kidneys are normal in size and position. No superimposed intrarenal masses or calcifications are seen. The adrenal glands are unremarkable. Stomach/Bowel: Aside from the eccentric mural mass involving the anterior wall of the rectum, the stomach, small bowel, and large bowel are unremarkable. No evidence of obstruction or focal inflammation. Appendix normal. No free intraperitoneal gas or fluid. Vascular/Lymphatic: There is progressive retroperitoneal and pelvic adenopathy. By example a new periaortic lymph node has developed at axial image # 46/2 measuring 12 mm x 22 mm. Pathologic adenopathy is now seen within the left periaortic, bilateral common iliac, external iliac, and perirectal lymph node groups. Presacral edema appears similar. Mild aortoiliac atherosclerotic  calcification is present. No aortic aneurysm. Reproductive: As noted above, the prostate gland is confluent with multiple lobulated soft tissue mass is invading the base of the bladder and anterior wall of the rectum in keeping with a primary prostatic malignancy. Other: Increasing anasarca with subcutaneous edema involving the body wall. Musculoskeletal: Multiple sclerotic metastases are again seen throughout the visualized axial skeleton which appears slightly progressive since prior examination. By example a. sclerotic metastasis involving the L4 vertebral body has slightly increased in size, best appreciated on sagittal reformats. No pathologic fracture IMPRESSION: Locally invasive prostate malignancy with multiple lobulated masses involving the base of the bladder as well as the anterior wall of the rectum. Resultant obstruction of the ureterovesicular junctions bilaterally with progressive, moderate bilateral hydronephrosis. Preserved renal parenchymal volume. Interval disease progression with development of extensive retroperitoneal and pelvic adenopathy as well as slight interval progression of widespread sclerotic osseous metastases. Electronically Signed   By: Fidela Salisbury MD   On: 09/28/2020 22:21   DG Hip Unilat With Pelvis 2-3 Views Right  Result Date: 09/28/2020 CLINICAL DATA:  Status post fall 3 days ago with right hip pain. History of prostate cancer. EXAM: DG HIP (WITH OR WITHOUT PELVIS) 2-3V RIGHT COMPARISON:  Abdomen and pelvis CT, dated June 14, 2020 FINDINGS: There is no evidence of an acute hip fracture or dislocation. Degenerative changes are seen involving both hips in the form of joint space narrowing and acetabular sclerosis. Ill-defined sclerotic changes are seen involving the medial aspects of the bilateral inferior pubic rami and lateral aspect of the lower right iliac bone. Soft tissue structures are unremarkable. IMPRESSION: 1. No acute osseous abnormality. 2. Sclerotic changes  within the pelvis, as described above, consistent with patient's known history of osseous metastasis. Electronically Signed   By: Virgina Norfolk M.D.   On: 09/28/2020 18:52   IR NEPHROSTOMY PLACEMENT LEFT  Result Date: 09/29/2020 INDICATION: 70 year old male with prostate carcinoma, hydronephrosis and acute renal failure referred for bilateral percutaneous nephrostomy. EXAM: IR NEPHROSTOMY PLACEMENT LEFT; IR NEPHROSTOMY PLACEMENT RIGHT COMPARISON:  CT 09/28/2020 MEDICATIONS: None ANESTHESIA/SEDATION: Fentanyl 50 mcg IV; Versed 1.0 mg IV Moderate Sedation Time:  15 minutes The patient was continuously monitored during the procedure by the interventional radiology nurse under my direct supervision. CONTRAST:  29mL OMNIPAQUE IOHEXOL 300 MG/ML SOLN - administered into the collecting system(s) FLUOROSCOPY TIME:  Fluoroscopy Time: 1 minutes 48 seconds (23 mGy). COMPLICATIONS: None PROCEDURE: Informed written consent was obtained from the patient after a thorough discussion of the procedural risks, benefits and alternatives. All questions were addressed. Maximal Sterile Barrier Technique was utilized including caps, mask, sterile gowns, sterile gloves, sterile drape, hand hygiene and skin antiseptic. A timeout was performed prior to the initiation of the procedure. Patient  positioned prone position on the fluoroscopy table. Ultrasound survey of the bilateral flank was performed with images stored and sent to PACs. Left: The patient was then prepped and draped in the usual sterile fashion. 1% lidocaine was used to anesthetize the skin and subcutaneous tissues for local anesthesia. A Chiba needle was then used to access a posterior inferior calyx of the right kidney with ultrasound guidance. With spontaneous urine returned through the needle, passage of an 018 micro wire into the collecting system was performed under fluoroscopy. A small incision was made with an 11 blade scalpel, and the needle was removed from the  wire. An Accustick system was then advanced over the wire into the collecting system under fluoroscopy. The metal stiffener and inner dilator were removed, and then a sample of fluid was aspirated through the 4 French outer sheath. Bentson wire was passed into the collecting system and the sheath removed. Ten French dilation of the soft tissues was performed. Using modified Seldinger technique, a 10 French pigtail catheter drain was placed over the Bentson wire. Wire and inner stiffener removed, and the pigtail was formed in the collecting system. Small amount of contrast confirmed position of the catheter. Right: 1% lidocaine was used to anesthetize the skin and subcutaneous tissues for local anesthesia. A Chiba needle was then used to access a posterior inferior calyx of the left kidney with ultrasound guidance. With spontaneous urine returned through the needle, passage of an 018 micro wire into the collecting system was performed under fluoroscopy. A small incision was made with an 11 blade scalpel, and the needle was removed from the wire. An Accustick system was then advanced over the wire into the collecting system under fluoroscopy. The metal stiffener and inner dilator were removed, and then a sample of fluid was aspirated through the 4 French outer sheath. Bentson wire was passed into the collecting system and the sheath removed. Ten French dilation of the soft tissues was performed. Using modified Seldinger technique, a 10 French pigtail catheter drain was placed over the Bentson wire. Wire and inner stiffener removed, and the pigtail was formed in the collecting system. Small amount of contrast confirmed position of the catheter. Patient tolerated the procedure well and remained hemodynamically stable throughout. No complications were encountered and no significant blood loss encountered IMPRESSION: Status post image guided bilateral percutaneous nephrostomy Signed, Dulcy Fanny. Dellia Nims, RPVI [facility] and  Interventional Radiology Specialists [facility] Radiology Electronically Signed   By: Corrie Mckusick D.O.   On: 09/29/2020 14:45   IR NEPHROSTOMY PLACEMENT RIGHT  Result Date: 09/29/2020 INDICATION: 70 year old male with prostate carcinoma, hydronephrosis and acute renal failure referred for bilateral percutaneous nephrostomy. EXAM: IR NEPHROSTOMY PLACEMENT LEFT; IR NEPHROSTOMY PLACEMENT RIGHT COMPARISON:  CT 09/28/2020 MEDICATIONS: None ANESTHESIA/SEDATION: Fentanyl 50 mcg IV; Versed 1.0 mg IV Moderate Sedation Time:  15 minutes The patient was continuously monitored during the procedure by the interventional radiology nurse under my direct supervision. CONTRAST:  8mL OMNIPAQUE IOHEXOL 300 MG/ML SOLN - administered into the collecting system(s) FLUOROSCOPY TIME:  Fluoroscopy Time: 1 minutes 48 seconds (23 mGy). COMPLICATIONS: None PROCEDURE: Informed written consent was obtained from the patient after a thorough discussion of the procedural risks, benefits and alternatives. All questions were addressed. Maximal Sterile Barrier Technique was utilized including caps, mask, sterile gowns, sterile gloves, sterile drape, hand hygiene and skin antiseptic. A timeout was performed prior to the initiation of the procedure. Patient positioned prone position on the fluoroscopy table. Ultrasound survey of the bilateral flank  was performed with images stored and sent to PACs. Left: The patient was then prepped and draped in the usual sterile fashion. 1% lidocaine was used to anesthetize the skin and subcutaneous tissues for local anesthesia. A Chiba needle was then used to access a posterior inferior calyx of the right kidney with ultrasound guidance. With spontaneous urine returned through the needle, passage of an 018 micro wire into the collecting system was performed under fluoroscopy. A small incision was made with an 11 blade scalpel, and the needle was removed from the wire. An Accustick system was then advanced over  the wire into the collecting system under fluoroscopy. The metal stiffener and inner dilator were removed, and then a sample of fluid was aspirated through the 4 French outer sheath. Bentson wire was passed into the collecting system and the sheath removed. Ten French dilation of the soft tissues was performed. Using modified Seldinger technique, a 10 French pigtail catheter drain was placed over the Bentson wire. Wire and inner stiffener removed, and the pigtail was formed in the collecting system. Small amount of contrast confirmed position of the catheter. Right: 1% lidocaine was used to anesthetize the skin and subcutaneous tissues for local anesthesia. A Chiba needle was then used to access a posterior inferior calyx of the left kidney with ultrasound guidance. With spontaneous urine returned through the needle, passage of an 018 micro wire into the collecting system was performed under fluoroscopy. A small incision was made with an 11 blade scalpel, and the needle was removed from the wire. An Accustick system was then advanced over the wire into the collecting system under fluoroscopy. The metal stiffener and inner dilator were removed, and then a sample of fluid was aspirated through the 4 French outer sheath. Bentson wire was passed into the collecting system and the sheath removed. Ten French dilation of the soft tissues was performed. Using modified Seldinger technique, a 10 French pigtail catheter drain was placed over the Bentson wire. Wire and inner stiffener removed, and the pigtail was formed in the collecting system. Small amount of contrast confirmed position of the catheter. Patient tolerated the procedure well and remained hemodynamically stable throughout. No complications were encountered and no significant blood loss encountered IMPRESSION: Status post image guided bilateral percutaneous nephrostomy Signed, Dulcy Fanny. Dellia Nims, RPVI [facility] and Interventional Radiology Specialists [facility]  Radiology Electronically Signed   By: Corrie Mckusick D.O.   On: 09/29/2020 14:45   NM PET (F18-PYLARIFY) SKULL TO MID THIGH  Result Date: 09/19/2020 CLINICAL DATA:  Prostate cancer with bone metastasis. Prostate biopsied 03/09/2019. Chemotherapy in 06/24/2019. Radiation therapy in 2021. Recent PSA of 91.5. EXAM: NUCLEAR MEDICINE PET SKULL BASE TO THIGH TECHNIQUE: 9.6 mCi F18 Piflufolastat (Pylarify) was injected intravenously. Full-ring PET imaging was performed from the skull base to thigh after the radiotracer. CT data was obtained and used for attenuation correction and anatomic localization. COMPARISON:  Chest abdomen and pelvic CTs of 06/14/2020. FINDINGS: NECK Right low jugular/supraclavicular node measures 8 mm and a S.U.V. max of 10.0 on 61/4. Incidental CT finding: Bilateral carotid atherosclerosis. No other cervical adenopathy. CHEST No radiotracer accumulation within mediastinal or hilar lymph nodes. No tracer avid pulmonary nodules on the CT scan. Incidental CT finding: Right Port-A-Cath tip at low SVC. Aortic atherosclerosis. Right upper lobe 6 mm nodule on 30/8 is unchanged and below PET resolution. ABDOMEN/PELVIS The dominant pelvic mass, with extension into the urinary bladder is poorly evaluated secondary to physiologic bladder hypermetabolism. Measures on the order of  5.9 x 5.6 cm on 200/4. Corresponds to tracer affinity centered about the right side of the prostate including at a S.U.V. max of 15.4. Compare 5.3 x 5.5 cm on the prior exam (when remeasured). Tracer avid abdominal retroperitoneal nodes. Example left periaortic node of 1.5 cm and a S.U.V. max of 28.2 on 145/4. Compare 1.2 cm on 06/14/2020 (when remeasured). A node within the left common iliac station measures 1.1 cm and a S.U.V. max of 11.0 on 175/4. Compare 9 mm on the prior CT (when remeasured). Extensive hypermetabolic pelvic adenopathy. Left external iliac node measures 1.1 cm and a S.U.V. max of 29.6 on 195/4. Newly enlarged  since the prior CT. Tracer avid nodes surrounding pelvic bowel loops. Example anterior right pelvic node of 1.1 cm and a S.U.V. max of 28.8 on 188/4, new. A right perirectal node measures 8 mm and a S.U.V. max of 6.4 on 204/4, likely new since the prior CT. Incidental CT finding: Abdominal aortic atherosclerosis. Moderate right-sided hydroureteronephrosis is followed to the level of the ureterovesicular junction and the locally advanced prostate primary, similar. Interval development of mild to moderate left-sided hydronephrosis and mild left hydroureter, also followed to the level of the ureterovesicular junction. Normal adrenal glands. Colonic stool burden suggests constipation. SKELETON Extensive tracer avid osseous metastasis. Eccentric left coccygeal lesion measures a S.U.V. max of 19.2, including on 194/4. A left acetabular lesion measures a S.U.V. max of 23.7 on 193/4. L4 lesion measures a S.U.V. max of 39.5. IMPRESSION: 1. Locally advanced prostate primary with bladder extension. Given concurrent physiologic urinary tracer affinity, the primary is somewhat challenging to evaluate. Felt to be slightly enlarged compared to 06/14/2020. Persistent secondary right and new left sided hydroureteronephrosis. 2. Moderate abdominal and marked pelvic nodal metastasis, progressive since 06/14/2020. 3. Isolated right supraclavicular/low cervical tracer avid nodal metastasis. 4. Widespread osseous metastasis. 5. No change in 6 mm right upper lobe pulmonary nodule, below PET resolution. Electronically Signed   By: Abigail Miyamoto M.D.   On: 09/19/2020 12:30      LAB RESULTS: Basic Metabolic Panel: Recent Labs  Lab 10/02/20 0511 10/03/20 0530 10/04/20 0456 10/05/20 0520  NA 145   < > 139 140  K 3.6   < > 3.3* 3.4*  CL 107   < > 103 104  CO2 31   < > 30 29  GLUCOSE 113*   < > 94 93  BUN 50*   < > 24* 21  CREATININE 3.13*   < > 1.65* 1.54*  CALCIUM 8.9   < > 8.7* 8.5*  MG 2.0  --   --   --    < > = values in  this interval not displayed.   Liver Function Tests: Recent Labs  Lab 09/29/20 0425  AST 20  ALT 11  ALKPHOS 81  BILITOT 0.5  PROT 8.0  ALBUMIN 3.1*   No results for input(s): LIPASE, AMYLASE in the last 168 hours. No results for input(s): AMMONIA in the last 168 hours. CBC: Recent Labs  Lab 09/28/20 1757 09/29/20 0425 09/30/20 0446  WBC 6.3 7.9 7.0  NEUTROABS 4.8  --   --   HGB 7.9* 8.7* 8.1*  HCT 26.1* 28.8* 26.6*  MCV 92.2 92.9 92.0  PLT 286 317 260   Cardiac Enzymes: No results for input(s): CKTOTAL, CKMB, CKMBINDEX, TROPONINI in the last 168 hours. BNP: Invalid input(s): POCBNP CBG: No results for input(s): GLUCAP in the last 168 hours.     Disposition and Follow-up: Discharge  Instructions     Diet - low sodium heart healthy   Complete by: As directed    Increase activity slowly   Complete by: As directed    No wound care   Complete by: As directed         DISPOSITION: [place] Follow up.   Specialty: Home Health Services Why: For home health RN Contact information: [place] [address] [phone]         [name], DO Follow up in 2 week(s).   Specialty: Family Medicine Contact information: [address] [address] [phone]         [name], MD. Schedule an appointment as soon as possible for a visit in 2 week(s).   Specialty: Urology Contact information: [address] [place] [address] [phone]                  Time coordinating discharge:  35 minutes  Signed:   Estill Cotta M.D. [facility] 10/05/2020, 12:05 PM

## 2020-10-05 NOTE — Progress Notes (Signed)
Discharge instructions given to patient. Patient taught how to empty nephrostomy bag and how to do dressing changes. Patient verbalized understanding of how to do this. Per patient his niece will be helping him at home. Medications and pharmacy verified with patient.

## 2020-10-07 ENCOUNTER — Other Ambulatory Visit: Payer: Self-pay

## 2020-10-07 ENCOUNTER — Emergency Department (HOSPITAL_COMMUNITY)
Admission: EM | Admit: 2020-10-07 | Discharge: 2020-10-07 | Disposition: A | Discharge status: Home / Self Care | Payer: Medicare Other | Attending: Emergency Medicine | Admitting: Emergency Medicine

## 2020-10-07 ENCOUNTER — Emergency Department (HOSPITAL_COMMUNITY): Payer: Medicare Other

## 2020-10-07 DIAGNOSIS — S9030XA Contusion of unspecified foot, initial encounter: Secondary | ICD-10-CM

## 2020-10-07 DIAGNOSIS — Z8546 Personal history of malignant neoplasm of prostate: Secondary | ICD-10-CM | POA: Diagnosis not present

## 2020-10-07 DIAGNOSIS — R531 Weakness: Secondary | ICD-10-CM | POA: Diagnosis not present

## 2020-10-07 DIAGNOSIS — W19XXXA Unspecified fall, initial encounter: Secondary | ICD-10-CM

## 2020-10-07 DIAGNOSIS — Y92009 Unspecified place in unspecified non-institutional (private) residence as the place of occurrence of the external cause: Secondary | ICD-10-CM | POA: Diagnosis not present

## 2020-10-07 DIAGNOSIS — W1839XA Other fall on same level, initial encounter: Secondary | ICD-10-CM | POA: Insufficient documentation

## 2020-10-07 DIAGNOSIS — S99921A Unspecified injury of right foot, initial encounter: Secondary | ICD-10-CM | POA: Diagnosis present

## 2020-10-07 DIAGNOSIS — S9031XA Contusion of right foot, initial encounter: Secondary | ICD-10-CM | POA: Diagnosis not present

## 2020-10-07 DIAGNOSIS — S9032XA Contusion of left foot, initial encounter: Secondary | ICD-10-CM | POA: Insufficient documentation

## 2020-10-07 MED ORDER — HYDROMORPHONE HCL 2 MG PO TABS
4.0000 mg | ORAL_TABLET | Freq: Once | ORAL | Status: AC
  Administered 2020-10-07: 4 mg via ORAL
  Filled 2020-10-07: qty 2

## 2020-10-07 NOTE — Discharge Instructions (Addendum)
Continue your medications as prescribed.  You are going to be very weak over the next 1 to 2 weeks because you were just in the hospital and it is very common to lose a lot of your strength.  Make sure every time that you are up moving around you are using your walker or wheelchair.  Talk with your doctor about possibly increasing your gabapentin if you continue to have nerve pain.  No broken bones today.

## 2020-10-07 NOTE — ED Provider Notes (Signed)
[facility] DEPT Provider Note   CSN: [id number] Arrival date & time: 10/07/20  1100     History Chief Complaint  Patient presents with   Fall   off balance    Andre Glass is a 70 y.o. male.  Patient is a 69 year old male with a history of metastatic prostate cancer, locally invasive to the bladder, lumbar spine and recent bilateral nephrostomy tubes , anemia of chronic disease, history of kidney stones, recurrent UTI, recurrent fecal impaction who is presenting today after a fall at home and now pain in the lower back and bilateral upper feet.  Patient has been home for 2 days.  He took his pain medication this morning and reports he was going to walk to the bathroom just by holding onto the side of the wall when both legs gave out and he fell to the floor.  He denies hitting his head or loss of consciousness.  Since the fall he has had severe pain in his feet and in his back and he does not feel that he can stand and walk because of the pain.  He denies feeling lightheaded or dizzy.  No chest pain, shortness of breath, neck pain, abdominal pain, nausea or vomiting.  His nephrostomy tubes are still in place and draining.  He normally takes Dilaudid every 4 hours and is on morphine extended release twice a day.  The history is provided by the patient.  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[place])  04/19/2019    Past Surgical History:  Procedure Laterality Date   IR IMAGING GUIDED PORT INSERTION  01/31/2020   IR NEPHROSTOMY PLACEMENT LEFT  09/29/2020   IR NEPHROSTOMY PLACEMENT RIGHT  09/29/2020   TRANSURETHRAL RESECTION OF PROSTATE N/A 11/08/2019   Procedure: TRANSURETHRAL RESECTION OF THE PROSTATE (TURP);  Surgeon: [name], MD;  Location: [facility] ORS;  Service: Urology;  Laterality: N/A;       Family History  Problem Relation Age of Onset   Stomach cancer Mother    Pancreatic cancer Sister    Breast cancer Neg Hx    Prostate cancer Neg Hx    Colon cancer Neg Hx     Social History   Tobacco Use   Smoking status: Never   Smokeless tobacco: Never  Vaping Use   Vaping Use: Never used  Substance Use Topics   Alcohol use: Never   Drug use: Never    Home Medications Prior to Admission medications   Medication Sig Start Date End Date Taking? Authorizing Provider  amLODipine (NORVASC) 10 MG tablet Take 1 tablet (10 mg total) by mouth daily. 10/05/20   Rai, Vernelle Emerald, MD  gabapentin (NEURONTIN) 100 MG capsule Take 2 capsules (200 mg total) by mouth 2 (two) times daily. 10/05/20   Rai, Vernelle Emerald, MD  glycerin adult 2 g suppository Place 1 suppository rectally as needed for constipation. 10/05/20  Rai, Ripudeep K, MD  HYDROmorphone (DILAUDID) 4 MG tablet Take 1 tablet (4 mg total) by mouth every 4 (four) hours as needed for severe pain. 09/27/20   [name], MD  lidocaine-prilocaine (EMLA) cream Apply 1 application topically as needed. 01/15/20   [name], MD  linaclotide (LINZESS) 290 MCG CAPS capsule Take 1 capsule (290 mcg total) by mouth daily as needed (constipation, take before breakfast). 10/05/20   Rai, Ripudeep Raliegh Ip, MD  morphine (MS CONTIN) 60 MG 12 hr tablet Take 1 tablet (60 mg total) by mouth every 8 (eight) hours. 09/27/20   [name], MD  Multiple Vitamin (MULTIVITAMIN WITH MINERALS) TABS tablet Take 1 tablet by mouth daily.    [provider]  naloxegol oxalate (MOVANTIK) 12.5 MG TABS tablet Take 1 tablet (12.5 mg total) by mouth daily. Take it in the morning 1 hour before breakfast daily for constipation. 10/05/20   Rai, Vernelle Emerald, MD  NONFORMULARY OR COMPOUNDED ITEM Apply 1-2 g topically 4 (four) times daily. 04/24/20   Marzetta Board, DPM  ondansetron (ZOFRAN) 4 MG tablet Take 1 tablet (4 mg total) by mouth 3 (three) times daily as needed for nausea or vomiting. 09/24/20   [name], MD  Plecanatide (TRULANCE) 3 MG TABS Take 3 mg by mouth daily. 08/08/20   [name], DO  polyethylene glycol (MIRALAX) 17 g packet Take 17 g by mouth 2 (two) times daily. 01/19/20   Hongalgi, Lenis Dickinson, MD  predniSONE (DELTASONE) 5 MG tablet Take 1 tablet (5 mg total) by mouth daily with breakfast. 10/25/19   [name], MD  prochlorperazine (COMPAZINE) 10 MG tablet Take 1 tablet (10 mg total) by mouth every 6 (six) hours as needed for nausea or vomiting. 09/24/20   [name], MD  senna (SENOKOT) 8.6 MG TABS tablet Take 2 tablets by mouth daily.    [provider]  tamsulosin (FLOMAX) 0.4 MG CAPS capsule TAKE 1 CAPSULE (0.4 MG TOTAL) BY MOUTH DAILY AS NEEDED (TO HELP WITH URINATION). 09/03/20   [name], DO  vitamin C (ASCORBIC ACID) 500 MG tablet Take 1,000 mg by mouth daily.    [provider]  Vitamin E 268 MG (400 UNIT) CHEW Chew 1 tablet by mouth daily.    [provider]    Allergies    Ibuprofen and Shrimp [shellfish allergy]  Review of Systems   Review of Systems  All other systems reviewed and are negative.  Physical Exam Updated Vital Signs BP 135/68   Pulse 77   Temp 98.5 F (36.9 C) (Oral)   Resp 18   Ht 5\' 8"  (1.727 m)   Wt 91.6 kg   SpO2 98%   BMI 30.71 kg/m   Physical Exam Vitals and nursing note reviewed.  Constitutional:      General: He is not in acute distress.    Appearance: He is well-developed.  HENT:     Head: Normocephalic and atraumatic.     Mouth/Throat:      Mouth: Mucous membranes are moist.  Eyes:     Conjunctiva/sclera: Conjunctivae normal.     Pupils: Pupils are equal, round, and reactive to light.  Cardiovascular:     Rate and Rhythm: Normal rate and regular rhythm.     Heart sounds: No murmur heard. Pulmonary:     Effort: Pulmonary effort is normal. No respiratory distress.     Breath sounds: Normal breath sounds. No wheezing or rales.  Abdominal:     General: There is no distension.     Palpations: Abdomen is soft.     Tenderness: There is no abdominal tenderness. There is no guarding or rebound.     Comments: Bilateral nephrostomy tubes in place  Musculoskeletal:        General: Tenderness present. Normal range of motion.     Cervical back: Normal range of motion and neck supple. No tenderness.     Right lower leg: No edema.     Left lower leg: No edema.       Legs:     Comments: Ankles bilaterally are normal.  Pain with palpation over the lumbar spine.  Skin:    General: Skin is warm and dry.     Coloration: Skin is pale.     Findings: No erythema or rash.  Neurological:     Mental Status: He is alert and oriented to person, place, and time. Mental status is at baseline.  Psychiatric:        Behavior: Behavior normal.    ED Results / Procedures / Treatments   Labs (all labs ordered are listed, but only abnormal results are displayed) Labs Reviewed - No data to display  EKG EKG Interpretation  Date/Time:  Monday October 07 2020 11:10:21 EDT Ventricular Rate:  99 PR Interval:  170 QRS Duration: 66 QT Interval:  326 QTC Calculation: 418 R Axis:   -30 Text Interpretation: Normal sinus rhythm Left axis deviation Inferior infarct , age undetermined Anterior infarct , age undetermined No significant change since last tracing Confirmed by Blanchie Dessert [phone]) on 10/07/2020 11:36:46 AM  Radiology CT Lumbar Spine Wo Contrast  Result Date: 10/07/2020 CLINICAL DATA:  Fall with low back pain, known metastatic disease  EXAM: CT LUMBAR SPINE WITHOUT CONTRAST TECHNIQUE: Multidetector CT imaging of the lumbar spine was performed without intravenous contrast administration. Multiplanar CT image reconstructions were also generated. COMPARISON:  October 2021 FINDINGS: Segmentation: 5 lumbar type vertebrae. Alignment: No significant listhesis. Vertebrae: Vertebral body heights are maintained. No acute fracture. There are patchy areas of sclerosis reflecting known metastatic disease. No evidence of significant epidural extension. Paraspinal and other soft tissues: Nonspecific presacral soft tissue thickening. Retroperitoneal adenopathy. These findings are present on 09/28/2020 abdominopelvic imaging. Partially imaged nephrostomy catheters. Disc levels: Multilevel degenerative changes are present. Canal narrowing is greatest at L4-L5 and L5-S1 without apparent high-grade stenosis. Right greater than left lower lumbar foraminal narrowing. IMPRESSION: No acute fracture. Known sclerotic metastatic disease. No pathologic compression fracture or evidence of significant epidural extension. Electronically Signed   By: Macy Mis M.D.   On: 10/07/2020 12:10    Procedures Procedures   Medications Ordered in ED Medications  HYDROmorphone (DILAUDID) tablet 4 mg (4 mg Oral Given 10/07/20 1150)    ED Course  I have reviewed the triage vital signs and the nursing notes.  Pertinent labs & imaging results that were available during my care of the patient were reviewed by me and considered in my medical decision making (see chart for details).    MDM Rules/Calculators/A&P                          Elderly male with multiple medical problems including invasive prostate cancer now with bilateral nephrostomy tubes who is presenting today after a fall at home.  Patient went home from the hospital 2 days ago and was walking without his walker today to the bathroom when his legs gave  out and he fell.  He is complaining of pain over the  bilateral dorsal portions of the feet as well as lumbar back pain.  Patient's CT is negative for any acute pathologic fracture.  Sclerotic disease is still present but no acute changes.  Plain films of the feet are still pending.  Patient given his normal dose of Dilaudid.  2:50 PM Imaging is negative for acute findings.  Patient's nephew was here in the I feel comfortable going home.  He does have a rolling walker at home as well as a wheelchair.  Discussed with him that he will be very weak after his prolonged hospital stay and procedures.  He needs to use a walker at all times where the wheelchair until some of his strength is regained.  He is otherwise well-appearing.  Feel that he is stable for discharge.  MDM   Amount and/or Complexity of Data Reviewed Tests in the radiology section of CPT: ordered and reviewed Independent visualization of images, tracings, or specimens: yes    Final Clinical Impression(s) / ED Diagnoses Final diagnoses:  Fall, initial encounter  Generalized weakness  Contusion of foot, unspecified laterality, initial encounter    Rx / DC Orders ED Discharge Orders     None        Blanchie Dessert, MD 10/07/20 1452

## 2020-10-08 ENCOUNTER — Telehealth: Payer: Self-pay | Admitting: *Deleted

## 2020-10-08 ENCOUNTER — Other Ambulatory Visit: Payer: Self-pay | Admitting: Family Medicine

## 2020-10-08 DIAGNOSIS — N1330 Unspecified hydronephrosis: Secondary | ICD-10-CM

## 2020-10-08 NOTE — Telephone Encounter (Signed)
Received call from [facility] with [name] stating that they received a call from case manager at [facility] post discharge.  They are going to see patient and provide care for nephrostomy but need a referral placed in Epic for them to file the insurance.  Will forward to MD to place referral for disciplines needed.  Findley Vi,CMA

## 2020-10-08 NOTE — Telephone Encounter (Signed)
Transition Care Management Follow-up Telephone Call Date of discharge and from where: 10/07/2020 - [facility] How have you been since you were released from the hospital? "I am alright" Any questions or concerns? No  Items Reviewed: Did the pt receive and understand the discharge instructions provided? Yes  Medications obtained and verified?  N/A Other? No  Any new allergies since your discharge? No  Dietary orders reviewed? No Do you have support at home? Yes    Functional Questionnaire: (I = Independent and D = Dependent) ADLs: I  Bathing/Dressing- I  Meal Prep- I  Eating- I  Maintaining continence- I  Transferring/Ambulation- D - rolling walker or wheelchair  Managing Meds- I  Follow up appointments reviewed:  PCP Hospital f/u appt confirmed? No   Specialist Hospital f/u appt confirmed? No   Are transportation arrangements needed?  N/A If their condition worsens, is the pt aware to call PCP or go to the Emergency Dept.? Yes Was the patient provided with contact information for the PCP's office or ED? Yes Was to pt encouraged to call back with questions or concerns? Yes

## 2020-10-11 ENCOUNTER — Telehealth: Payer: Self-pay

## 2020-10-11 NOTE — Telephone Encounter (Signed)
Received phone call from PT regarding patient complaint of chronic constipation. Reports that OTC agents such as miralax, senna, glycerin suppositories are not working.   Reports last BM on 7/2. Reports abdominal discomfort between 7-8/10.   Reports last fleet enema prior to BM on 7/2. Patient reports that he received okay from Oncologist to perform fleet enemas as needed. Reports that this is the only treatment that helps with his constipation. Recommended that patient perform enema, drink plenty of fluids and provided with ED precautions. Also advised that patient reach out to oncologist for further management of long standing problems with constipation.   Will forward to PCP for additional recommendations.   Talbot Grumbling, RN

## 2020-10-12 ENCOUNTER — Emergency Department (HOSPITAL_COMMUNITY)
Admission: EM | Admit: 2020-10-12 | Discharge: 2020-10-12 | Disposition: A | Discharge status: Home / Self Care | Payer: Medicare Other | Attending: Emergency Medicine | Admitting: Emergency Medicine

## 2020-10-12 ENCOUNTER — Other Ambulatory Visit: Payer: Self-pay

## 2020-10-12 ENCOUNTER — Emergency Department (HOSPITAL_COMMUNITY): Payer: Medicare Other

## 2020-10-12 DIAGNOSIS — Z8546 Personal history of malignant neoplasm of prostate: Secondary | ICD-10-CM | POA: Insufficient documentation

## 2020-10-12 DIAGNOSIS — R109 Unspecified abdominal pain: Secondary | ICD-10-CM | POA: Diagnosis present

## 2020-10-12 DIAGNOSIS — K5903 Drug induced constipation: Secondary | ICD-10-CM | POA: Insufficient documentation

## 2020-10-12 LAB — COMPREHENSIVE METABOLIC PANEL
ALT: 15 U/L (ref 0–44)
AST: 28 U/L (ref 15–41)
Albumin: 2.8 g/dL — ABNORMAL LOW (ref 3.5–5.0)
Alkaline Phosphatase: 100 U/L (ref 38–126)
Anion gap: 7 (ref 5–15)
BUN: 26 mg/dL — ABNORMAL HIGH (ref 8–23)
CO2: 27 mmol/L (ref 22–32)
Calcium: 8.9 mg/dL (ref 8.9–10.3)
Chloride: 101 mmol/L (ref 98–111)
Creatinine, Ser: 1.22 mg/dL (ref 0.61–1.24)
GFR, Estimated: >60 mL/min (ref >60)
Glucose, Bld: 132 mg/dL — ABNORMAL HIGH (ref 70–99)
Potassium: 3.8 mmol/L (ref 3.5–5.1)
Sodium: 135 mmol/L (ref 135–145)
Total Bilirubin: 0.5 mg/dL (ref 0.3–1.2)
Total Protein: 8 g/dL (ref 6.5–8.1)

## 2020-10-12 LAB — CBC WITH DIFFERENTIAL/PLATELET
Abs Immature Granulocytes: 0.06 10*3/uL (ref 0.00–0.07)
Basophils Absolute: 0 10*3/uL (ref 0.0–0.1)
Basophils Relative: 0 %
Eosinophils Absolute: 0 10*3/uL (ref 0.0–0.5)
Eosinophils Relative: 0 %
HCT: 24.2 % — ABNORMAL LOW (ref 39.0–52.0)
Hemoglobin: 7.4 g/dL — ABNORMAL LOW (ref 13.0–17.0)
Immature Granulocytes: 1 %
Lymphocytes Relative: 8 %
Lymphs Abs: 0.9 10*3/uL (ref 0.7–4.0)
MCH: 28.4 pg (ref 26.0–34.0)
MCHC: 30.6 g/dL (ref 30.0–36.0)
MCV: 92.7 fL (ref 80.0–100.0)
Monocytes Absolute: 0.7 10*3/uL (ref 0.1–1.0)
Monocytes Relative: 6 %
Neutro Abs: 10.3 10*3/uL — ABNORMAL HIGH (ref 1.7–7.7)
Neutrophils Relative %: 85 %
Platelets: 293 10*3/uL (ref 150–400)
RBC: 2.61 MIL/uL — ABNORMAL LOW (ref 4.22–5.81)
RDW: 14.8 % (ref 11.5–15.5)
WBC: 12 10*3/uL — ABNORMAL HIGH (ref 4.0–10.5)
nRBC: 0 % (ref 0.0–0.2)

## 2020-10-12 LAB — LIPASE, BLOOD: Lipase: 22 U/L (ref 11–51)

## 2020-10-12 LAB — MAGNESIUM: Magnesium: 1.9 mg/dL (ref 1.7–2.4)

## 2020-10-12 MED ORDER — POLYETHYLENE GLYCOL 3350 17 G PO PACK: 17.0000 g | PACK | Freq: Two times a day (BID) | ORAL | 0 refills | Status: DC

## 2020-10-12 MED ORDER — FLEET ENEMA 7-19 GM/118ML RE ENEM
1.0000 | ENEMA | Freq: Once | RECTAL | Status: AC
  Administered 2020-10-12: 1 via RECTAL
  Filled 2020-10-12: qty 1

## 2020-10-12 MED ORDER — SODIUM CHLORIDE 0.9 % IV SOLN: INTRAVENOUS | Status: DC

## 2020-10-12 MED ORDER — SORBITOL 70 % SOLN
960.0000 mL | TOPICAL_OIL | Freq: Once | ORAL | Status: AC
  Administered 2020-10-12: 960 mL via RECTAL
  Filled 2020-10-12: qty 473

## 2020-10-12 MED ORDER — SENNOSIDES-DOCUSATE SODIUM 8.6-50 MG PO TABS: 2.0000 | ORAL_TABLET | Freq: Every day | ORAL | 0 refills | Status: AC

## 2020-10-12 MED ORDER — HEPARIN SOD (PORK) LOCK FLUSH 100 UNIT/ML IV SOLN
500.0000 [IU] | Freq: Once | INTRAVENOUS | Status: AC
  Administered 2020-10-12: 500 [IU]
  Filled 2020-10-12: qty 5

## 2020-10-12 NOTE — ED Notes (Signed)
Pt currently sitting on BSC s/p enema.  No results thus far.

## 2020-10-12 NOTE — ED Triage Notes (Signed)
Patient reports constipation x7 days. Pain rated 9/10 In abdomen

## 2020-10-12 NOTE — ED Notes (Signed)
Port deaccessed. Pt verbalized understanding of d/c, medication, and follow up care.

## 2020-10-12 NOTE — ED Provider Notes (Signed)
[facility] DEPT Provider Note   CSN: [id number] Arrival date & time: 10/12/20  1245     History Chief Complaint  Patient presents with   Constipation    Andre Glass is a 70 y.o. male.  HPI Patient presents with his brother who assists with the history. Patient presents due to concern for increasing abdominal discomfort, and constipation. Patient's medical issues include ongoing therapy for metastatic prostate cancer.  He is on multiple narcotics, has a diagnosis of opioid-induced constipation.  He was previously on MiraLAX and senna.  1 week ago, after hospitalization he was advised to stop these medications, start a new regimen of 2 prescriptions for opioid-induced constipation.  He was unable to obtain 1 secondary cost, and obtained the second only yesterday having taken only 1 dose. Today he presents with increasing abdominal distention, no rectal pain, no vomiting, and no bowel movement in about 1 week. No fever, chills or other complaints.     Past Medical History:  Diagnosis Date   Anemia    History of kidney stones    Prostate cancer ([place])    UTI (urinary tract infection)     Patient Active Problem List   Diagnosis Date Noted   AKI (acute kidney injury) ([place]) 09/28/2020   Hydronephrosis 09/28/2020   Obstructive uropathy 09/28/2020   Intractable pain 01/18/2020   BPH (benign prostatic hyperplasia) 11/08/2019   Catheter-associated urinary tract infection ([place]) 09/14/2019   Drug induced constipation 07/18/2019   Goals of care, counseling/discussion 06/26/2019   Metastatic castration-resistant adenocarcinoma of prostate ([place]) 04/19/2019    Past Surgical History:  Procedure Laterality Date   IR IMAGING GUIDED PORT INSERTION  01/31/2020   IR NEPHROSTOMY PLACEMENT LEFT  09/29/2020   IR NEPHROSTOMY PLACEMENT RIGHT  09/29/2020   TRANSURETHRAL RESECTION OF PROSTATE N/A 11/08/2019   Procedure: TRANSURETHRAL RESECTION OF THE PROSTATE (TURP);   Surgeon: [name], MD;  Location: [facility];  Service: Urology;  Laterality: N/A;       Family History  Problem Relation Age of Onset   Stomach cancer Mother    Pancreatic cancer Sister    Breast cancer Neg Hx    Prostate cancer Neg Hx    Colon cancer Neg Hx     Social History   Tobacco Use   Smoking status: Never   Smokeless tobacco: Never  Vaping Use   Vaping Use: Never used  Substance Use Topics   Alcohol use: Never   Drug use: Never    Home Medications Prior to Admission medications   Medication Sig Start Date End Date Taking? Authorizing Provider  amLODipine (NORVASC) 10 MG tablet Take 1 tablet (10 mg total) by mouth daily. 10/05/20   Rai, Vernelle Emerald, MD  gabapentin (NEURONTIN) 100 MG capsule Take 2 capsules (200 mg total) by mouth 2 (two) times daily. 10/05/20   Rai, Vernelle Emerald, MD  glycerin adult 2 g suppository Place 1 suppository rectally as needed for constipation. 10/05/20   Rai, Ripudeep K, MD  HYDROmorphone (DILAUDID) 4 MG tablet Take 1 tablet (4 mg total) by mouth every 4 (four) hours as needed for severe pain. 09/27/20   [name], MD  lidocaine-prilocaine (EMLA) cream Apply 1 application topically as needed. 01/15/20   [name], MD  linaclotide (LINZESS) 290 MCG CAPS capsule Take 1 capsule (290 mcg total) by mouth daily as needed (constipation, take before breakfast). 10/05/20   Rai, Ripudeep Raliegh Ip, MD  morphine (MS CONTIN) 60 MG 12  hr tablet Take 1 tablet (60 mg total) by mouth every 8 (eight) hours. 09/27/20   [name], MD  Multiple Vitamin (MULTIVITAMIN WITH MINERALS) TABS tablet Take 1 tablet by mouth daily.    [provider]  naloxegol oxalate (MOVANTIK) 12.5 MG TABS tablet Take 1 tablet (12.5 mg total) by mouth daily. Take it in the morning 1 hour before breakfast daily for constipation. 10/05/20   Rai, Vernelle Emerald, MD  NONFORMULARY OR COMPOUNDED ITEM Apply 1-2 g topically 4 (four) times daily. 04/24/20   Marzetta Board, DPM   ondansetron (ZOFRAN) 4 MG tablet Take 1 tablet (4 mg total) by mouth 3 (three) times daily as needed for nausea or vomiting. 09/24/20   [name], MD  Plecanatide (TRULANCE) 3 MG TABS Take 3 mg by mouth daily. 08/08/20   [name], DO  polyethylene glycol (MIRALAX) 17 g packet Take 17 g by mouth 2 (two) times daily. 01/19/20   Hongalgi, Lenis Dickinson, MD  predniSONE (DELTASONE) 5 MG tablet Take 1 tablet (5 mg total) by mouth daily with breakfast. 10/25/19   [name], MD  prochlorperazine (COMPAZINE) 10 MG tablet Take 1 tablet (10 mg total) by mouth every 6 (six) hours as needed for nausea or vomiting. 09/24/20   [name], MD  senna (SENOKOT) 8.6 MG TABS tablet Take 2 tablets by mouth daily.    [provider]  tamsulosin (FLOMAX) 0.4 MG CAPS capsule TAKE 1 CAPSULE (0.4 MG TOTAL) BY MOUTH DAILY AS NEEDED (TO HELP WITH URINATION). 09/03/20   [name], DO  vitamin C (ASCORBIC ACID) 500 MG tablet Take 1,000 mg by mouth daily.    [provider]  Vitamin E 268 MG (400 UNIT) CHEW Chew 1 tablet by mouth daily.    [provider]    Allergies    Ibuprofen and Shrimp [shellfish allergy]  Review of Systems   Review of Systems  Constitutional:        Per HPI, otherwise negative  HENT:         Per HPI, otherwise negative  Respiratory:         Per HPI, otherwise negative  Cardiovascular:        Per HPI, otherwise negative  Gastrointestinal:  Positive for abdominal pain, constipation and nausea. Negative for vomiting.  Endocrine:       Negative aside from HPI  Genitourinary:        Neg aside from HPI   Musculoskeletal:        Per HPI, otherwise negative  Skin: Negative.   Allergic/Immunologic: Positive for immunocompromised state.  Neurological:  Negative for syncope.   Physical Exam Updated Vital Signs BP (!) 112/55   Pulse (!) 57   Temp 99.6 F (37.6 C) (Oral)   Resp 16   Ht 5\' 8"  (1.727 m)   Wt 91.6 kg   SpO2 98%   BMI 30.71 kg/m    Physical Exam Vitals and nursing note reviewed.  Constitutional:      General: He is not in acute distress.    Appearance: He is well-developed.  HENT:     Head: Normocephalic and atraumatic.  Eyes:     Conjunctiva/sclera: Conjunctivae normal.  Cardiovascular:     Rate and Rhythm: Normal rate and regular rhythm.  Pulmonary:     Effort: Pulmonary effort is normal. No respiratory distress.     Breath sounds: No stridor.  Abdominal:     Tenderness: There is abdominal tenderness.  Comments: Bilateral nephrostomy tubes exiting posteriorly, unremarkable  Genitourinary:    Comments: Hard stool - performed w student Skin:    General: Skin is warm and dry.  Neurological:     Mental Status: He is alert and oriented to person, place, and time.    ED Results / Procedures / Treatments   Labs (all labs ordered are listed, but only abnormal results are displayed) Labs Reviewed  CBC WITH DIFFERENTIAL/PLATELET - Abnormal; Notable for the following components:      Result Value   WBC 12.0 (*)    RBC 2.61 (*)    Hemoglobin 7.4 (*)    HCT 24.2 (*)    Neutro Abs 10.3 (*)    All other components within normal limits  COMPREHENSIVE METABOLIC PANEL - Abnormal; Notable for the following components:   Glucose, Bld 132 (*)    BUN 26 (*)    Albumin 2.8 (*)    All other components within normal limits  LIPASE, BLOOD  MAGNESIUM    EKG None  Radiology DG ABD ACUTE 2+V W 1V CHEST  Result Date: 10/12/2020 CLINICAL DATA:  Abdominal pain EXAM: DG ABDOMEN ACUTE WITH 1 VIEW CHEST COMPARISON:  06/18/2019 FINDINGS: Port-A-Cath tip in the SVC.  Heart is normal size.  Lungs clear. Bilateral nephrostomy catheters in place. No organomegaly, free air, or evidence of bowel obstruction. No suspicious calcification. IMPRESSION: No acute cardiopulmonary disease. No acute findings in the abdomen or pelvis. Electronically Signed   By: Rolm Baptise M.D.   On: 10/12/2020 15:50    Procedures Procedures    Medications Ordered in ED Medications  0.9 %  sodium chloride infusion (has no administration in time range)  sodium phosphate (FLEET) 7-19 GM/118ML enema 1 enema (has no administration in time range)    ED Course  I have reviewed the triage vital signs and the nursing notes.  Pertinent labs & imaging results that were available during my care of the patient were reviewed by me and considered in my medical decision making (see chart for details).  With consideration of constipation after rectal exam was performed, and disimpaction did not result in stool patient started enema  10:58 PM Patient has received several enemas, has had scant stool production, but is on the commode, states the discomfort going home, continue the process at home. He, his brother and I discussed today's evaluation and his history of opioid-induced constipation.  We discussed options for ongoing therapy including reinitiation of Senokot, ongoing new opiate induced constipation meds and seeing his physician for consideration of financial assistance with obtaining Linzess. Without other evidence for obstruction or infection, patient appropriate for discharge, per request to follow-up with his physicians with ongoing outpatient therapy. Final Clinical Impression(s) / ED Diagnoses Final diagnoses:  Drug-induced constipation    Rx / DC Orders ED Discharge Orders          Ordered    polyethylene glycol (MIRALAX) 17 g packet  2 times daily        10/12/20 2301    senna-docusate (SENOKOT-S) 8.6-50 MG tablet  Daily        10/12/20 2301             Carmin Muskrat, MD 10/12/20 2302

## 2020-10-12 NOTE — Discharge Instructions (Addendum)
As discussed, it is importantly follow-up with your physician on Monday to discuss today's presentation and to consider initial medications for relief of your drug-induced constipation.  If you do develop new, or concerning changes, do not hesitate to return here.

## 2020-10-12 NOTE — ED Notes (Signed)
2nd iteration of SMOG administered.

## 2020-10-12 NOTE — ED Notes (Signed)
1st round of SMOG administered

## 2020-10-14 ENCOUNTER — Encounter: Payer: Self-pay | Admitting: Oncology

## 2020-10-14 ENCOUNTER — Telehealth: Payer: Self-pay

## 2020-10-14 ENCOUNTER — Telehealth: Payer: Self-pay | Admitting: *Deleted

## 2020-10-14 NOTE — Telephone Encounter (Signed)
Transition Care Management Follow-up Telephone Call Date of discharge and from where: 10/12/2020- [facility]  How have you been since you were released from the hospital? Doing Better Any questions or concerns? No  Items Reviewed: Did the pt receive and understand the discharge instructions provided? Yes  Medications obtained and verified? Yes  Other? No  Any new allergies since your discharge? No  Dietary orders reviewed? No Do you have support at home? Yes   Home Care and Equipment/Supplies: Were home health services ordered? yes If so, what is the name of the agency? [facility] Has the agency set up a time to come to the patient's home? yes Were any new equipment or medical supplies ordered?  No What is the name of the medical supply agency? N/A Were you able to get the supplies/equipment? not applicable Do you have any questions related to the use of the equipment or supplies? No  Functional Questionnaire: (I = Independent and D = Dependent) ADLs: D  Bathing/Dressing- D  Meal Prep- D  Eating- I  Maintaining continence- I  Transferring/Ambulation- D  Managing Meds- D  Follow up appointments reviewed:  PCP Hospital f/u appt confirmed? No  Patient will call to schedule follow up. [facility] f/u appt confirmed? No   Are transportation arrangements needed? No  If their condition worsens, is the pt aware to call PCP or go to the Emergency Dept.? Yes Was the patient provided with contact information for the PCP's office or ED? Yes Was to pt encouraged to call back with questions or concerns? Yes

## 2020-10-14 NOTE — Telephone Encounter (Signed)
Returned PC to patient, he states he was seen in ED over the weekend for constipation.  He received enemas x 3, and 3 prescriptions with no results, has not had BM in 8 days.  He is unable to get Linzess rx filled due to cost.  Instructed patient to take magnesium citrate - 1/2 bottle by mouth, repeat in 2-4 hours if no results.  Also instructed patient to buy Senna S over the counter - take 3 tablets at night & 3 in the morning until having BM's regularly.  Patient advised to call our office tomorrow if no results.  Patient verbalizes understanding of all instructions.

## 2020-10-14 NOTE — Telephone Encounter (Signed)
Transition Care Management Unsuccessful Follow-up Telephone Call  Date of discharge and from where:  10/12/2020- [facility]  Attempts:  1st Attempt  Reason for unsuccessful TCM follow-up call:  Left voice message

## 2020-10-15 ENCOUNTER — Emergency Department (HOSPITAL_COMMUNITY): Payer: Medicare Other

## 2020-10-15 ENCOUNTER — Emergency Department (HOSPITAL_COMMUNITY)
Admission: EM | Admit: 2020-10-15 | Discharge: 2020-10-15 | Disposition: A | Discharge status: Home / Self Care | Payer: Medicare Other | Source: Home / Self Care | Attending: Emergency Medicine | Admitting: Emergency Medicine

## 2020-10-15 ENCOUNTER — Encounter (HOSPITAL_COMMUNITY): Payer: Self-pay | Admitting: Emergency Medicine

## 2020-10-15 ENCOUNTER — Telehealth (HOSPITAL_COMMUNITY): Payer: Self-pay

## 2020-10-15 ENCOUNTER — Other Ambulatory Visit: Payer: Self-pay

## 2020-10-15 DIAGNOSIS — G894 Chronic pain syndrome: Secondary | ICD-10-CM | POA: Insufficient documentation

## 2020-10-15 DIAGNOSIS — M25551 Pain in right hip: Secondary | ICD-10-CM | POA: Insufficient documentation

## 2020-10-15 DIAGNOSIS — W19XXXA Unspecified fall, initial encounter: Secondary | ICD-10-CM

## 2020-10-15 DIAGNOSIS — R0789 Other chest pain: Secondary | ICD-10-CM | POA: Insufficient documentation

## 2020-10-15 DIAGNOSIS — W06XXXA Fall from bed, initial encounter: Secondary | ICD-10-CM | POA: Insufficient documentation

## 2020-10-15 DIAGNOSIS — Z8546 Personal history of malignant neoplasm of prostate: Secondary | ICD-10-CM | POA: Insufficient documentation

## 2020-10-15 DIAGNOSIS — A4159 Other Gram-negative sepsis: Secondary | ICD-10-CM | POA: Diagnosis not present

## 2020-10-15 DIAGNOSIS — R4182 Altered mental status, unspecified: Secondary | ICD-10-CM | POA: Diagnosis not present

## 2020-10-15 MED ORDER — MORPHINE SULFATE ER 30 MG PO TBCR
60.0000 mg | EXTENDED_RELEASE_TABLET | Freq: Once | ORAL | Status: AC
  Administered 2020-10-15: 60 mg via ORAL
  Filled 2020-10-15: qty 4

## 2020-10-15 NOTE — ED Notes (Signed)
Patient to radiology.

## 2020-10-15 NOTE — ED Provider Notes (Signed)
[facility] DEPT Provider Note   CSN: [id number] Arrival date & time: 10/15/20  1056     History Chief Complaint  Patient presents with   Andre Glass is a 70 y.o. male.  Patient with history of prostate cancer who presents the ED after a fall.  Patient states that he rolled out of bed overnight, landed on his right side.  Does not know if he hit his head.  Having some right hip, right chest wall pain.  Uses a wheelchair at baseline.  Not on blood thinners.  The history is provided by the patient.  Fall This is a new problem. The current episode started 3 to 5 hours ago. The problem has been resolved. Pertinent negatives include no chest pain, no abdominal pain and no shortness of breath. Nothing aggravates the symptoms. Nothing relieves the symptoms. He has tried nothing for the symptoms. The treatment provided no relief.      Past Medical History:  Diagnosis Date   Anemia    History of kidney stones    Prostate cancer ([place])    UTI (urinary tract infection)     Patient Active Problem List   Diagnosis Date Noted   AKI (acute kidney injury) ([place]) 09/28/2020   Hydronephrosis 09/28/2020   Obstructive uropathy 09/28/2020   Intractable pain 01/18/2020   BPH (benign prostatic hyperplasia) 11/08/2019   Catheter-associated urinary tract infection ([place]) 09/14/2019   Drug induced constipation 07/18/2019   Goals of care, counseling/discussion 06/26/2019   Metastatic castration-resistant adenocarcinoma of prostate ([place]) 04/19/2019    Past Surgical History:  Procedure Laterality Date   IR IMAGING GUIDED PORT INSERTION  01/31/2020   IR NEPHROSTOMY PLACEMENT LEFT  09/29/2020   IR NEPHROSTOMY PLACEMENT RIGHT  09/29/2020   TRANSURETHRAL RESECTION OF PROSTATE N/A 11/08/2019   Procedure: TRANSURETHRAL RESECTION OF THE PROSTATE (TURP);  Surgeon: [name], MD;  Location: [facility];  Service: Urology;  Laterality: N/A;       Family History   Problem Relation Age of Onset   Stomach cancer Mother    Pancreatic cancer Sister    Breast cancer Neg Hx    Prostate cancer Neg Hx    Colon cancer Neg Hx     Social History   Tobacco Use   Smoking status: Never   Smokeless tobacco: Never  Vaping Use   Vaping Use: Never used  Substance Use Topics   Alcohol use: Never   Drug use: Never    Home Medications Prior to Admission medications   Medication Sig Start Date End Date Taking? Authorizing Provider  amLODipine (NORVASC) 10 MG tablet Take 1 tablet (10 mg total) by mouth daily. 10/05/20   Rai, Vernelle Emerald, MD  gabapentin (NEURONTIN) 100 MG capsule Take 2 capsules (200 mg total) by mouth 2 (two) times daily. 10/05/20   Rai, Vernelle Emerald, MD  glycerin adult 2 g suppository Place 1 suppository rectally as needed for constipation. 10/05/20   Rai, Ripudeep K, MD  HYDROmorphone (DILAUDID) 4 MG tablet Take 1 tablet (4 mg total) by mouth every 4 (four) hours as needed for severe pain. 09/27/20   [name], MD  lidocaine-prilocaine (EMLA) cream Apply 1 application topically as needed. 01/15/20   [name], MD  linaclotide (LINZESS) 290 MCG CAPS capsule Take 1 capsule (290 mcg total) by mouth daily as needed (constipation, take before breakfast). 10/05/20   Rai, Ripudeep Raliegh Ip, MD  morphine (MS CONTIN) 60 MG 12  hr tablet Take 1 tablet (60 mg total) by mouth every 8 (eight) hours. 09/27/20   [name], MD  Multiple Vitamin (MULTIVITAMIN WITH MINERALS) TABS tablet Take 1 tablet by mouth daily.    [provider]  naloxegol oxalate (MOVANTIK) 12.5 MG TABS tablet Take 1 tablet (12.5 mg total) by mouth daily. Take it in the morning 1 hour before breakfast daily for constipation. 10/05/20   Rai, Vernelle Emerald, MD  NONFORMULARY OR COMPOUNDED ITEM Apply 1-2 g topically 4 (four) times daily. 04/24/20   Marzetta Board, DPM  ondansetron (ZOFRAN) 4 MG tablet Take 1 tablet (4 mg total) by mouth 3 (three) times daily as needed for nausea or  vomiting. 09/24/20   [name], MD  Plecanatide (TRULANCE) 3 MG TABS Take 3 mg by mouth daily. 08/08/20   [name], DO  polyethylene glycol (MIRALAX) 17 g packet Take 17 g by mouth 2 (two) times daily. 10/12/20   Carmin Muskrat, MD  predniSONE (DELTASONE) 5 MG tablet Take 1 tablet (5 mg total) by mouth daily with breakfast. 10/25/19   [name], MD  prochlorperazine (COMPAZINE) 10 MG tablet Take 1 tablet (10 mg total) by mouth every 6 (six) hours as needed for nausea or vomiting. 09/24/20   [name], MD  senna-docusate (SENOKOT-S) 8.6-50 MG tablet Take 2 tablets by mouth daily. 10/12/20 11/11/20  Carmin Muskrat, MD  tamsulosin (FLOMAX) 0.4 MG CAPS capsule TAKE 1 CAPSULE (0.4 MG TOTAL) BY MOUTH DAILY AS NEEDED (TO HELP WITH URINATION). 09/03/20   [name], DO  vitamin C (ASCORBIC ACID) 500 MG tablet Take 1,000 mg by mouth daily.    [provider]  Vitamin E 268 MG (400 UNIT) CHEW Chew 1 tablet by mouth daily.    [provider]    Allergies    Ibuprofen and Shrimp [shellfish allergy]  Review of Systems   Review of Systems  Constitutional:  Negative for chills and fever.  HENT:  Negative for ear pain and sore throat.   Eyes:  Negative for pain and visual disturbance.  Respiratory:  Negative for cough and shortness of breath.   Cardiovascular:  Negative for chest pain and palpitations.  Gastrointestinal:  Negative for abdominal pain and vomiting.  Genitourinary:  Negative for dysuria and hematuria.  Musculoskeletal:  Positive for arthralgias. Negative for back pain and myalgias.  Skin:  Negative for color change and rash.  Neurological:  Negative for seizures and syncope.  All other systems reviewed and are negative.  Physical Exam Updated Vital Signs BP (!) 135/57 (BP Location: Left Arm)   Pulse 81   Temp 98.6 F (37 C) (Oral)   Resp 18   Ht 5\' 8"  (1.727 m)   Wt 84.4 kg   SpO2 94%   BMI 28.28 kg/m   Physical Exam Vitals and nursing note  reviewed.  Constitutional:      General: He is not in acute distress.    Appearance: He is well-developed. He is not ill-appearing.  HENT:     Head: Normocephalic and atraumatic.  Eyes:     Extraocular Movements: Extraocular movements intact.     Conjunctiva/sclera: Conjunctivae normal.     Pupils: Pupils are equal, round, and reactive to light.  Cardiovascular:     Rate and Rhythm: Normal rate and regular rhythm.     Heart sounds: No murmur heard. Pulmonary:     Effort: Pulmonary effort is normal. No respiratory distress.     Breath  sounds: Normal breath sounds.  Abdominal:     Palpations: Abdomen is soft.     Tenderness: There is no abdominal tenderness.  Musculoskeletal:        General: Tenderness (TTP to right hip and right chest wall) present.     Cervical back: Normal range of motion and neck supple. No tenderness.  Skin:    General: Skin is warm and dry.  Neurological:     General: No focal deficit present.     Mental Status: He is alert and oriented to person, place, and time.     Cranial Nerves: No cranial nerve deficit.     Sensory: No sensory deficit.     Motor: No weakness.     Coordination: Coordination normal.    ED Results / Procedures / Treatments   Labs (all labs ordered are listed, but only abnormal results are displayed) Labs Reviewed - No data to display  EKG None  Radiology DG Chest 2 View  Result Date: 10/15/2020 CLINICAL DATA:  [place] from bed. EXAM: CHEST - 2 VIEW COMPARISON:  06/14/2020 FINDINGS: Heart size is normal. Mediastinal shadows are normal. The lungs are clear. Power port on the right has its tip in the SVC above the right atrium. No acute bone finding. IMPRESSION: No active cardiopulmonary disease. Electronically Signed   By: Nelson Chimes M.D.   On: 10/15/2020 11:55   CT Head Wo Contrast  Result Date: 10/15/2020 CLINICAL DATA:  [place] from bed.  History of metastatic cancer. EXAM: CT HEAD WITHOUT CONTRAST TECHNIQUE: Contiguous axial  images were obtained from the base of the skull through the vertex without intravenous contrast. COMPARISON:  02/06/2020 FINDINGS: Brain: The brain shows a normal appearance without evidence of malformation, atrophy, old or acute small or large vessel infarction, mass lesion, hemorrhage, hydrocephalus or extra-axial collection. Vascular: No hyperdense vessel. No evidence of atherosclerotic calcification. Skull: Normal.  No traumatic finding.  No focal bone lesion. Sinuses/Orbits: Sinuses are clear. Orbits appear normal. Mastoids are clear. Other: None significant IMPRESSION: Normal head CT. Electronically Signed   By: Nelson Chimes M.D.   On: 10/15/2020 11:59   CT Cervical Spine Wo Contrast  Result Date: 10/15/2020 CLINICAL DATA:  Metastatic cancer.  Fell from bed. EXAM: CT CERVICAL SPINE WITHOUT CONTRAST TECHNIQUE: Multidetector CT imaging of the cervical spine was performed without intravenous contrast. Multiplanar CT image reconstructions were also generated. COMPARISON:  CT scan July 2009 FINDINGS: Alignment: No malalignment. Skull base and vertebrae: No evidence of fracture or focal bone lesion in this area. Soft tissues and spinal canal: No soft tissue injury seen. Disc levels: No significant disc degeneration. Facet osteoarthritis on the right at C3-4 and on the left at C7-T1. Mild right foraminal stenosis at C3-4. Upper chest: Negative Other: None IMPRESSION: No acute traumatic finding. Facet osteoarthritis on the right at C3-4 with mild right foraminal stenosis. No evidence of metastatic disease in the cervical region. Electronically Signed   By: Nelson Chimes M.D.   On: 10/15/2020 12:01   DG Hip Unilat With Pelvis 2-3 Views Right  Result Date: 10/15/2020 CLINICAL DATA:  [place] from bed.  Right hip pain. EXAM: DG HIP (WITH OR WITHOUT PELVIS) 2-3V RIGHT COMPARISON:  PET scan 09/18/2020 FINDINGS: No acute traumatic finding. Sclerotic metastatic changes noted affecting the sacrum, iliac bones and ischial  regions. IMPRESSION: No acute traumatic finding. Widespread osseous metastatic disease as shown by recent PET scan. Electronically Signed   By: Nelson Chimes M.D.   On: 10/15/2020 11:58  Procedures Procedures   Medications Ordered in ED Medications  morphine (MS CONTIN) 12 hr tablet 60 mg (60 mg Oral Given 10/15/20 1148)    ED Course  I have reviewed the triage vital signs and the nursing notes.  Pertinent labs & imaging results that were available during my care of the patient were reviewed by me and considered in my medical decision making (see chart for details).    MDM Rules/Calculators/A&P                          PATRYK CONANT is here after fall.  History of prostate cancer, UTI.  Patient states that he rolled out of bed overnight.  Does not know if he lost consciousness.  Not on blood thinners.  He is having mostly right hip, right chest wall pain.  He is wheelchair-bound at baseline.  Neurologically he appears intact.  No obvious deformities of extremities.  Head CT, neck CT, chest x-ray, pelvis x-ray overall unremarkable.  Given reassurance and discharged from the ED in good condition.  Dose of home pain medication given.  This chart was dictated using voice recognition software.  Despite best efforts to proofread,  errors can occur which can change the documentation meaning.   Final Clinical Impression(s) / ED Diagnoses Final diagnoses:  Fall, initial encounter    Rx / DC Orders ED Discharge Orders     None        Lennice Sites, DO 10/15/20 1216

## 2020-10-15 NOTE — Telephone Encounter (Signed)
-----   Message from Corrie Mckusick, DO sent at 10/14/2020  4:08 PM EDT ----- Regarding: RE: Ureteral stent placement OK for attempt of antegrade ureteral stent placement, possible exchange of PCN.   Please have Dr. Purvis Sheffield office send over the most recent office note before scheduling to have on-hand for physician performing the case.   Earleen Newport  ----- Message ----- From: Danielle Dess Sent: 10/14/2020   3:42 PM EDT To: [facility] Procedure Requests Subject: Ureteral stent placement                       Procedure: Antegrade bilateral ureteral stent placement  Dx: hydronephrosis  Ordering: Dr. Link Snuffer [phone]  Imaging: ct renal stone done 6/25....bilateral nephrostomy tubes placed 6/29  Please review.   Thanks,  Lia Foyer

## 2020-10-15 NOTE — Discharge Instructions (Addendum)
No injuries on imaging today.

## 2020-10-15 NOTE — ED Notes (Signed)
Called pt friend (per pt request), Hosie Poisson, who states she will be here shortly to pick pt up. Advised her that pt will be waiting in [facility]

## 2020-10-15 NOTE — ED Triage Notes (Signed)
Patient arrives via EMS from his home- patient reports falling out of bed.  Patient denies hitting his head.  Patient co having bilateral knee pain and right hip pain.  Patient is a cancer patient (prostate) and has a foley cath with leg bag

## 2020-10-16 ENCOUNTER — Telehealth: Payer: Self-pay | Admitting: *Deleted

## 2020-10-16 ENCOUNTER — Telehealth (HOSPITAL_COMMUNITY): Payer: Self-pay

## 2020-10-16 ENCOUNTER — Other Ambulatory Visit: Payer: Self-pay | Admitting: *Deleted

## 2020-10-16 ENCOUNTER — Telehealth: Payer: Self-pay

## 2020-10-16 ENCOUNTER — Other Ambulatory Visit (HOSPITAL_COMMUNITY): Payer: Self-pay | Admitting: Urology

## 2020-10-16 DIAGNOSIS — R531 Weakness: Secondary | ICD-10-CM

## 2020-10-16 DIAGNOSIS — N1330 Unspecified hydronephrosis: Secondary | ICD-10-CM

## 2020-10-16 DIAGNOSIS — C61 Malignant neoplasm of prostate: Secondary | ICD-10-CM

## 2020-10-16 NOTE — Telephone Encounter (Signed)
Transition Care Management Unsuccessful Follow-up Telephone Call  Date of discharge and from where:  10/15/2020-[facility]  Attempts:  1st Attempt  Reason for unsuccessful TCM follow-up call:  Left voice message

## 2020-10-16 NOTE — Telephone Encounter (Signed)
Called to schedule stent placement, no answer, left vm. AW

## 2020-10-16 NOTE — Telephone Encounter (Signed)
DME - Hospital bed ordered by Dr. [name] for this patient, primary dx prostate cancer, also weakness & debility.  Patient needs to reposition frequently.

## 2020-10-16 NOTE — Telephone Encounter (Signed)
Returned PC to patient's friend, Belenda Cruise.  She called this morning stating patient is requesting a hospital bed because he has had 5 falls in July so far, mostly from falling out of bed.  Order received for hospital bed from Dr. [name].  Zach @ [facility] contacted regarding order, he will be contacting patient.  Katherine verbalizes understanding.

## 2020-10-17 ENCOUNTER — Telehealth: Payer: Self-pay

## 2020-10-17 ENCOUNTER — Encounter: Payer: Self-pay | Admitting: Oncology

## 2020-10-17 ENCOUNTER — Inpatient Hospital Stay (HOSPITAL_COMMUNITY): Payer: Medicare Other

## 2020-10-17 ENCOUNTER — Emergency Department (HOSPITAL_COMMUNITY): Payer: Medicare Other

## 2020-10-17 ENCOUNTER — Other Ambulatory Visit: Payer: Self-pay

## 2020-10-17 ENCOUNTER — Telehealth: Payer: Self-pay | Admitting: *Deleted

## 2020-10-17 ENCOUNTER — Encounter (HOSPITAL_COMMUNITY): Payer: Self-pay

## 2020-10-17 ENCOUNTER — Inpatient Hospital Stay (HOSPITAL_COMMUNITY)
Admission: EM | Admit: 2020-10-17 | Discharge: 2020-10-30 | DRG: 871 | Disposition: A | Discharge status: Home Health | Payer: Medicare Other | Attending: Family Medicine | Admitting: Family Medicine

## 2020-10-17 DIAGNOSIS — R509 Fever, unspecified: Secondary | ICD-10-CM

## 2020-10-17 DIAGNOSIS — Z7952 Long term (current) use of systemic steroids: Secondary | ICD-10-CM

## 2020-10-17 DIAGNOSIS — N3000 Acute cystitis without hematuria: Secondary | ICD-10-CM | POA: Diagnosis not present

## 2020-10-17 DIAGNOSIS — R4182 Altered mental status, unspecified: Secondary | ICD-10-CM | POA: Diagnosis present

## 2020-10-17 DIAGNOSIS — N179 Acute kidney failure, unspecified: Secondary | ICD-10-CM | POA: Diagnosis present

## 2020-10-17 DIAGNOSIS — C7951 Secondary malignant neoplasm of bone: Secondary | ICD-10-CM | POA: Diagnosis present

## 2020-10-17 DIAGNOSIS — R400 Somnolence: Secondary | ICD-10-CM

## 2020-10-17 DIAGNOSIS — N390 Urinary tract infection, site not specified: Secondary | ICD-10-CM

## 2020-10-17 DIAGNOSIS — G893 Neoplasm related pain (acute) (chronic): Secondary | ICD-10-CM | POA: Diagnosis present

## 2020-10-17 DIAGNOSIS — R7881 Bacteremia: Secondary | ICD-10-CM

## 2020-10-17 DIAGNOSIS — K5903 Drug induced constipation: Secondary | ICD-10-CM | POA: Diagnosis present

## 2020-10-17 DIAGNOSIS — Z8042 Family history of malignant neoplasm of prostate: Secondary | ICD-10-CM

## 2020-10-17 DIAGNOSIS — R569 Unspecified convulsions: Secondary | ICD-10-CM | POA: Diagnosis not present

## 2020-10-17 DIAGNOSIS — B961 Klebsiella pneumoniae [K. pneumoniae] as the cause of diseases classified elsewhere: Secondary | ICD-10-CM | POA: Diagnosis not present

## 2020-10-17 DIAGNOSIS — G253 Myoclonus: Secondary | ICD-10-CM | POA: Diagnosis present

## 2020-10-17 DIAGNOSIS — A4159 Other Gram-negative sepsis: Principal | ICD-10-CM | POA: Diagnosis present

## 2020-10-17 DIAGNOSIS — E8809 Other disorders of plasma-protein metabolism, not elsewhere classified: Secondary | ICD-10-CM | POA: Diagnosis not present

## 2020-10-17 DIAGNOSIS — I10 Essential (primary) hypertension: Secondary | ICD-10-CM | POA: Diagnosis present

## 2020-10-17 DIAGNOSIS — G629 Polyneuropathy, unspecified: Secondary | ICD-10-CM | POA: Diagnosis present

## 2020-10-17 DIAGNOSIS — R54 Age-related physical debility: Secondary | ICD-10-CM | POA: Diagnosis present

## 2020-10-17 DIAGNOSIS — Z91013 Allergy to seafood: Secondary | ICD-10-CM

## 2020-10-17 DIAGNOSIS — R1310 Dysphagia, unspecified: Secondary | ICD-10-CM | POA: Diagnosis present

## 2020-10-17 DIAGNOSIS — T83032A Leakage of nephrostomy catheter, initial encounter: Secondary | ICD-10-CM | POA: Diagnosis not present

## 2020-10-17 DIAGNOSIS — C61 Malignant neoplasm of prostate: Secondary | ICD-10-CM | POA: Diagnosis present

## 2020-10-17 DIAGNOSIS — R52 Pain, unspecified: Secondary | ICD-10-CM | POA: Diagnosis not present

## 2020-10-17 DIAGNOSIS — K5909 Other constipation: Secondary | ICD-10-CM | POA: Diagnosis present

## 2020-10-17 DIAGNOSIS — G9341 Metabolic encephalopathy: Secondary | ICD-10-CM | POA: Diagnosis present

## 2020-10-17 DIAGNOSIS — Z79899 Other long term (current) drug therapy: Secondary | ICD-10-CM

## 2020-10-17 DIAGNOSIS — Z515 Encounter for palliative care: Secondary | ICD-10-CM | POA: Diagnosis not present

## 2020-10-17 DIAGNOSIS — Z7189 Other specified counseling: Secondary | ICD-10-CM | POA: Diagnosis not present

## 2020-10-17 DIAGNOSIS — T402X5A Adverse effect of other opioids, initial encounter: Secondary | ICD-10-CM | POA: Diagnosis present

## 2020-10-17 DIAGNOSIS — C7911 Secondary malignant neoplasm of bladder: Secondary | ICD-10-CM | POA: Diagnosis present

## 2020-10-17 DIAGNOSIS — Z20822 Contact with and (suspected) exposure to covid-19: Secondary | ICD-10-CM | POA: Diagnosis present

## 2020-10-17 DIAGNOSIS — Y732 Prosthetic and other implants, materials and accessory gastroenterology and urology devices associated with adverse incidents: Secondary | ICD-10-CM | POA: Diagnosis not present

## 2020-10-17 DIAGNOSIS — N136 Pyonephrosis: Secondary | ICD-10-CM | POA: Diagnosis present

## 2020-10-17 DIAGNOSIS — Z8546 Personal history of malignant neoplasm of prostate: Secondary | ICD-10-CM

## 2020-10-17 DIAGNOSIS — E43 Unspecified severe protein-calorie malnutrition: Secondary | ICD-10-CM | POA: Diagnosis present

## 2020-10-17 DIAGNOSIS — D649 Anemia, unspecified: Secondary | ICD-10-CM | POA: Diagnosis not present

## 2020-10-17 DIAGNOSIS — Z789 Other specified health status: Secondary | ICD-10-CM | POA: Diagnosis not present

## 2020-10-17 DIAGNOSIS — Z800 Family history of malignant neoplasm of digestive organs: Secondary | ICD-10-CM

## 2020-10-17 DIAGNOSIS — R109 Unspecified abdominal pain: Secondary | ICD-10-CM | POA: Diagnosis not present

## 2020-10-17 DIAGNOSIS — N400 Enlarged prostate without lower urinary tract symptoms: Secondary | ICD-10-CM | POA: Diagnosis present

## 2020-10-17 DIAGNOSIS — E876 Hypokalemia: Secondary | ICD-10-CM | POA: Diagnosis not present

## 2020-10-17 DIAGNOSIS — Z886 Allergy status to analgesic agent status: Secondary | ICD-10-CM

## 2020-10-17 DIAGNOSIS — G9340 Encephalopathy, unspecified: Secondary | ICD-10-CM | POA: Diagnosis not present

## 2020-10-17 DIAGNOSIS — D638 Anemia in other chronic diseases classified elsewhere: Secondary | ICD-10-CM | POA: Diagnosis present

## 2020-10-17 LAB — URINALYSIS, ROUTINE W REFLEX MICROSCOPIC
Bilirubin Urine: NEGATIVE
Glucose, UA: NEGATIVE mg/dL
Ketones, ur: NEGATIVE mg/dL
Nitrite: POSITIVE — AB
Protein, ur: 30 mg/dL — AB
Specific Gravity, Urine: 1.006 (ref 1.005–1.030)
pH: 7 (ref 5.0–8.0)

## 2020-10-17 LAB — COMPREHENSIVE METABOLIC PANEL
ALT: 99 U/L — ABNORMAL HIGH (ref 0–44)
AST: 172 U/L — ABNORMAL HIGH (ref 15–41)
Albumin: 2.3 g/dL — ABNORMAL LOW (ref 3.5–5.0)
Alkaline Phosphatase: 153 U/L — ABNORMAL HIGH (ref 38–126)
Anion gap: 9 (ref 5–15)
BUN: 36 mg/dL — ABNORMAL HIGH (ref 8–23)
CO2: 27 mmol/L (ref 22–32)
Calcium: 8.5 mg/dL — ABNORMAL LOW (ref 8.9–10.3)
Chloride: 99 mmol/L (ref 98–111)
Creatinine, Ser: 1.66 mg/dL — ABNORMAL HIGH (ref 0.61–1.24)
GFR, Estimated: 44 mL/min — ABNORMAL LOW (ref >60)
Glucose, Bld: 147 mg/dL — ABNORMAL HIGH (ref 70–99)
Potassium: 3.8 mmol/L (ref 3.5–5.1)
Sodium: 135 mmol/L (ref 135–145)
Total Bilirubin: 0.3 mg/dL (ref 0.3–1.2)
Total Protein: 7.5 g/dL (ref 6.5–8.1)

## 2020-10-17 LAB — CBC WITH DIFFERENTIAL/PLATELET
Abs Immature Granulocytes: 0.14 10*3/uL — ABNORMAL HIGH (ref 0.00–0.07)
Basophils Absolute: 0 10*3/uL (ref 0.0–0.1)
Basophils Relative: 0 %
Eosinophils Absolute: 0.1 10*3/uL (ref 0.0–0.5)
Eosinophils Relative: 1 %
HCT: 17.5 % — ABNORMAL LOW (ref 39.0–52.0)
Hemoglobin: 5.4 g/dL — CL (ref 13.0–17.0)
Immature Granulocytes: 1 %
Lymphocytes Relative: 10 %
Lymphs Abs: 1.4 10*3/uL (ref 0.7–4.0)
MCH: 28.7 pg (ref 26.0–34.0)
MCHC: 30.9 g/dL (ref 30.0–36.0)
MCV: 93.1 fL (ref 80.0–100.0)
Monocytes Absolute: 1.1 10*3/uL — ABNORMAL HIGH (ref 0.1–1.0)
Monocytes Relative: 8 %
Neutro Abs: 11.3 10*3/uL — ABNORMAL HIGH (ref 1.7–7.7)
Neutrophils Relative %: 80 %
Platelets: 393 10*3/uL (ref 150–400)
RBC: 1.88 MIL/uL — ABNORMAL LOW (ref 4.22–5.81)
RDW: 15.5 % (ref 11.5–15.5)
WBC: 14.1 10*3/uL — ABNORMAL HIGH (ref 4.0–10.5)
nRBC: 0 % (ref 0.0–0.2)

## 2020-10-17 LAB — PREPARE RBC (CROSSMATCH)

## 2020-10-17 LAB — RESP PANEL BY RT-PCR (FLU A&B, COVID) ARPGX2
Influenza A by PCR: NEGATIVE
Influenza B by PCR: NEGATIVE
SARS Coronavirus 2 by RT PCR: NEGATIVE

## 2020-10-17 LAB — AMMONIA: Ammonia: 12 umol/L (ref 9–35)

## 2020-10-17 MED ORDER — ACETAMINOPHEN 325 MG PO TABS
650.0000 mg | ORAL_TABLET | Freq: Once | ORAL | Status: AC
  Administered 2020-10-17: 650 mg via ORAL
  Filled 2020-10-17: qty 2

## 2020-10-17 MED ORDER — SODIUM CHLORIDE 0.9 % IV SOLN: 10.0000 mL/h | Freq: Once | INTRAVENOUS | Status: DC

## 2020-10-17 MED ORDER — ACETAMINOPHEN 325 MG PO TABS: 650.0000 mg | ORAL_TABLET | Freq: Four times a day (QID) | ORAL | Status: DC | PRN

## 2020-10-17 MED ORDER — SODIUM CHLORIDE 0.9 % IV BOLUS
1000.0000 mL | Freq: Once | INTRAVENOUS | Status: AC
  Administered 2020-10-17: 1000 mL via INTRAVENOUS

## 2020-10-17 NOTE — Telephone Encounter (Signed)
Andre Glass calls nurse line requesting to speak with PCP. Andre Glass reports he is taking a lot of morphine and feels he is "jerking" more than usual. Andre Glass reports he has an apt on 7/25 with PCP, but would like to discuss this prior to visit. Andre Glass has questions about his diet as well. He was discharged with orders to follow a renal diet and feels this may be contributing to his "jerkiness," as well. I advised Andre Glass to call his oncologist since they are prescribing the pain meds. However, will forward to PCP as well.

## 2020-10-17 NOTE — ED Provider Notes (Addendum)
[facility] DEPT Provider Note   CSN: [id number] Arrival date & time: 10/17/20  1708     History Chief Complaint  Patient presents with   Altered Mental Status    Andre Glass is a 70 y.o. male.  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  During his recent hospitalization, he was started on gabapentin, hydromorphone, morphine, as well as medications for constipation.  [name] his home health nurse had called the nursing line earlier today, reporting that he was jerking more than usual, and that he was discharged with orders to follow a renal diet.  Brother reports that the jerking seem to start 2 days ago after he had rolled out of bed.  He had come to the emergency department and had CT imaging which did not show any acute abnormalities.  He reports that over the last 2 days he has had increasing jerking of his whole body.  He has not had any medication changes over the last couple of days.  Reports he did not take the gabapentin today, but did take the morphine.  He is otherwise been eating okay, his Foley catheter has been working, he has not had headache abdominal pain or focal abnormalities.  He has seemed sleepier today. No black or bloody stools.   Past Medical History:  Diagnosis Date   Anemia    History of kidney stones    Prostate cancer ([facility])    UTI (urinary tract infection)     Patient Active Problem List   Diagnosis Date Noted   Symptomatic anemia  10/17/2020   AKI (acute kidney injury) ([place]) 09/28/2020   Hydronephrosis 09/28/2020   Obstructive uropathy 09/28/2020   Intractable pain 01/18/2020   BPH (benign prostatic hyperplasia) 11/08/2019   Catheter-associated urinary tract infection ([place]) 09/14/2019   Drug induced constipation 07/18/2019   Goals of care, counseling/discussion 06/26/2019   Metastatic castration-resistant adenocarcinoma of prostate ([place]) 04/19/2019    Past Surgical History:  Procedure Laterality Date   IR IMAGING GUIDED PORT INSERTION  01/31/2020   IR NEPHROSTOMY PLACEMENT LEFT  09/29/2020   IR NEPHROSTOMY PLACEMENT RIGHT  09/29/2020   TRANSURETHRAL RESECTION OF PROSTATE N/A 11/08/2019   Procedure: TRANSURETHRAL RESECTION OF THE PROSTATE (TURP);  Surgeon: [name], MD;  Location: [facility] ORS;  Service: Urology;  Laterality: N/A;       Family History  Problem Relation Age of Onset   Stomach cancer Mother    Pancreatic cancer Sister    Breast cancer Neg Hx    Prostate cancer Neg Hx    Colon cancer Neg Hx     Social History   Tobacco Use   Smoking status: Never   Smokeless tobacco: Never  Vaping Use   Vaping Use: Never used  Substance Use Topics   Alcohol use: Never   Drug use: Never    Home Medications Prior to Admission medications   Medication Sig Start Date End Date Taking? Authorizing Provider  amLODipine (NORVASC) 10 MG tablet Take 1 tablet (10 mg total) by mouth daily. 10/05/20   Rai, Vernelle Emerald, MD  gabapentin (NEURONTIN) 100  MG capsule Take 2 capsules (200 mg total) by mouth 2 (two) times daily. 10/05/20   Rai, Vernelle Emerald, MD  glycerin adult 2 g suppository Place 1 suppository rectally as needed for constipation. 10/05/20   Rai, Ripudeep K, MD  HYDROmorphone (DILAUDID) 4 MG tablet Take 1 tablet (4 mg total) by mouth every 4 (four) hours as needed for severe pain. 09/27/20   [name], MD  lidocaine-prilocaine (EMLA) cream Apply 1 application topically as needed. 01/15/20   [name], MD  linaclotide (LINZESS) 290 MCG CAPS capsule Take 1 capsule (290 mcg total) by mouth daily as needed (constipation, take before breakfast). 10/05/20   Rai, Ripudeep Raliegh Ip, MD  morphine (MS CONTIN) 60 MG 12 hr tablet Take 1 tablet (60 mg total) by mouth every 8 (eight) hours. 09/27/20   [name], MD  Multiple Vitamin (MULTIVITAMIN WITH MINERALS) TABS tablet Take 1 tablet by mouth daily.    [provider]  naloxegol oxalate (MOVANTIK) 12.5 MG TABS tablet Take 1 tablet (12.5 mg total) by mouth daily. Take it in the morning 1 hour before breakfast daily for constipation. 10/05/20   Rai, Vernelle Emerald, MD  NONFORMULARY OR COMPOUNDED ITEM Apply 1-2 g topically 4 (four) times daily. 04/24/20   Marzetta Board, DPM  ondansetron (ZOFRAN) 4 MG tablet Take 1 tablet (4 mg total) by mouth 3 (three) times daily as needed for nausea or vomiting. 09/24/20   [name], MD  Plecanatide (TRULANCE) 3 MG TABS Take 3 mg by mouth daily. 08/08/20   [name], DO  polyethylene glycol (MIRALAX) 17 g packet Take 17 g by mouth 2 (two) times daily. 10/12/20   Carmin Muskrat, MD  predniSONE (DELTASONE) 5 MG tablet Take 1 tablet (5 mg total) by mouth daily with breakfast. 10/25/19   [name], MD  prochlorperazine (COMPAZINE) 10 MG tablet Take 1 tablet (10 mg total) by mouth every 6 (six) hours as needed for nausea or vomiting. 09/24/20   [name], MD  senna-docusate (SENOKOT-S) 8.6-50 MG tablet Take 2 tablets by mouth daily. 10/12/20 11/11/20  Carmin Muskrat, MD  tamsulosin (FLOMAX) 0.4 MG CAPS capsule TAKE 1 CAPSULE (0.4 MG TOTAL) BY MOUTH DAILY AS NEEDED (TO HELP WITH URINATION). 09/03/20   [name], DO  vitamin C (ASCORBIC ACID) 500 MG tablet Take 1,000 mg by mouth daily.    [provider]  Vitamin E 268 MG (400 UNIT) CHEW Chew 1 tablet by mouth daily.    [provider]    Allergies    Ibuprofen and Shrimp [shellfish allergy]  Review of Systems   Review of Systems   Unable to perform ROS: Mental status change  Constitutional:  Positive for appetite change and fatigue. Negative for fever.  Respiratory:  Negative for cough and shortness of breath.   Cardiovascular:  Negative for chest pain.  Gastrointestinal:  Negative for abdominal pain, nausea and vomiting.  Skin:  Negative for rash.  Neurological:  Positive for tremors (jerking). Negative for weakness, numbness and headaches.   Physical Exam Updated Vital Signs BP (!) 110/54 (BP Location: Right Arm)   Pulse 82   Temp (!) 102.9 F (39.4 C) (Oral)   Resp 20   Ht 5\' 8"  (1.727 m)   Wt 84.7 kg   SpO2 95%   BMI 28.39 kg/m   Physical Exam Vitals and nursing note reviewed.  Constitutional:      General: He is not in acute distress.  Appearance: He is well-developed. He is not diaphoretic.  HENT:     Head: Normocephalic and atraumatic.  Eyes:     Conjunctiva/sclera: Conjunctivae normal.  Cardiovascular:     Rate and Rhythm: Normal rate and regular rhythm.     Heart sounds: Normal heart sounds. No murmur heard.   No friction rub. No gallop.  Pulmonary:     Effort: Pulmonary effort is normal. No respiratory distress.     Breath sounds: Normal breath sounds. No wheezing or rales.  Abdominal:     General: There is no distension.     Palpations: Abdomen is soft.     Tenderness: There is no abdominal tenderness. There is no guarding.  Musculoskeletal:     Cervical back: Normal range of motion.  Skin:    General: Skin is warm and dry.  Neurological:     Mental Status: He is alert and oriented to person, place, and time.     Comments: Quick jerking movements full body, head, bilateral arms with extension quick flapping/loss of control for second Sleepy, able to follow commands, answer questions slowly    ED Results / Procedures / Treatments   Labs (all labs ordered are listed, but only abnormal results are displayed) Labs Reviewed  CBC WITH DIFFERENTIAL/PLATELET - Abnormal; Notable for  the following components:      Result Value   WBC 14.1 (*)    RBC 1.88 (*)    Hemoglobin 5.4 (*)    HCT 17.5 (*)    Neutro Abs 11.3 (*)    Monocytes Absolute 1.1 (*)    Abs Immature Granulocytes 0.14 (*)    All other components within normal limits  COMPREHENSIVE METABOLIC PANEL - Abnormal; Notable for the following components:   Glucose, Bld 147 (*)    BUN 36 (*)    Creatinine, Ser 1.66 (*)    Calcium 8.5 (*)    Albumin 2.3 (*)    AST 172 (*)    ALT 99 (*)    Alkaline Phosphatase 153 (*)    GFR, Estimated 44 (*)    All other components within normal limits  URINALYSIS, ROUTINE W REFLEX MICROSCOPIC - Abnormal; Notable for the following components:   Hgb urine dipstick MODERATE (*)    Protein, ur 30 (*)    Nitrite POSITIVE (*)    Leukocytes,Ua LARGE (*)    Bacteria, UA MANY (*)    All other components within normal limits  RESP PANEL BY RT-PCR (FLU A&B, COVID) ARPGX2  CULTURE, BLOOD (ROUTINE X 2)  CULTURE, BLOOD (ROUTINE X 2)  URINE CULTURE  AMMONIA  TYPE AND SCREEN  PREPARE RBC (CROSSMATCH)  TYPE AND SCREEN  PREPARE RBC (CROSSMATCH)    EKG EKG Interpretation  Date/Time:  Thursday October 17 2020 18:31:06 EDT Ventricular Rate:  81 PR Interval:  168 QRS Duration: 86 QT Interval:  354 QTC Calculation: 411 R Axis:   -30 Text Interpretation: Sinus rhythm Inferior infarct, old Anteroseptal infarct, old No significant change since last tracing Confirmed by Gareth Morgan [phone]) on 10/17/2020 7:46:46 PM  Radiology CT Head Wo Contrast  Result Date: 10/17/2020 CLINICAL DATA:  70 year old male with head trauma. EXAM: CT HEAD WITHOUT CONTRAST TECHNIQUE: Contiguous axial images were obtained from the base of the skull through the vertex without intravenous contrast. COMPARISON:  Head CT dated 10/15/2020. FINDINGS: Brain: The ventricles and sulci appropriate size for patient's age. Minimal periventricular and deep white matter chronic microvascular ischemic changes noted. There  is no acute intracranial  hemorrhage. No mass effect or midline shift. No extra-axial fluid collection. Vascular: No hyperdense vessel or unexpected calcification. Skull: Normal. Negative for fracture or focal lesion. Sinuses/Orbits: No acute finding. Other: None IMPRESSION: No acute intracranial pathology. Electronically Signed   By: Anner Crete M.D.   On: 10/17/2020 19:04   MR BRAIN WO CONTRAST  Result Date: 10/17/2020 CLINICAL DATA:  Initial evaluation for Parkinson's disease. EXAM: MRI HEAD WITHOUT CONTRAST TECHNIQUE: Multiplanar, multiecho pulse sequences of the brain and surrounding structures were obtained without intravenous contrast. COMPARISON:  Prior CT from earlier the same day. FINDINGS: Brain: Examination degraded by motion artifact. Mild diffuse prominence of the CSF containing spaces compatible with generalized cerebral atrophy. Few scattered patchy T2/FLAIR hyperintense foci involving the periventricular and deep white matter both cerebral hemispheres noted, most like related chronic microvascular ischemic disease, mild in nature. Few small remote lacunar infarcts present within the hemispheric cerebral white matter. No abnormal foci of restricted diffusion to suggest acute or subacute ischemia. Gray-white matter differentiation maintained. No encephalomalacia to suggest chronic cortical infarction. No evidence for acute intracranial hemorrhage. Few small chronic micro hemorrhages noted at the left basal ganglia and left parieto-occipital region, of doubtful significance. No mass lesion, midline shift or mass effect. No hydrocephalus or extra-axial fluid collection. Pituitary gland suprasellar region normal. Midline structures intact. Particular attention made to the region of the midbrain. No significant narrowing or volume loss seen about the pars compact a of the substantia nigra to suggest parkinsonian changes on this motion degraded exam. No abnormal susceptibility artifact to suggest  increased iron deposition. Vascular: Major intracranial vascular flow voids are maintained. Skull and upper cervical spine: Craniocervical junction within normal limits. Bone marrow signal intensity normal. No scalp soft tissue abnormality. Sinuses/Orbits: Globes and orbital soft tissues demonstrate no acute finding. Paranasal sinuses are largely clear. No mastoid effusion. Inner ear structures grossly normal. Other: None. IMPRESSION: 1. No acute intracranial abnormality. No convincing structural changes about the substantia nigra to suggest Parkinson's disease. 2. Mild age-related cerebral atrophy with chronic microvascular ischemic disease. Electronically Signed   By: Jeannine Boga M.D.   On: 10/17/2020 23:42   DG CHEST PORT 1 VIEW  Result Date: 10/18/2020 CLINICAL DATA:  Altered mental status and lethargy, history of recent falls EXAM: PORTABLE CHEST 1 VIEW COMPARISON:  10/15/2020 FINDINGS: Cardiac shadow is stable. Right chest wall port is again seen. Lungs are clear bilaterally. No bony abnormality is noted. IMPRESSION: No acute abnormality noted. Electronically Signed   By: Inez Catalina M.D.   On: 10/18/2020 00:23    Procedures .Critical Care  Date/Time: 10/18/2020 1:27 AM Performed by: Gareth Morgan, MD Authorized by: Gareth Morgan, MD   Critical care provider statement:    Critical care time (minutes):  45   Critical care was time spent personally by me on the following activities:  Discussions with consultants, evaluation of patient's response to treatment, examination of patient, ordering and performing treatments and interventions, ordering and review of laboratory studies, ordering and review of radiographic studies, pulse oximetry, re-evaluation of patient's condition, obtaining history from patient or surrogate and review of old charts   Medications Ordered in ED Medications  0.9 %  sodium chloride infusion (0 mL/hr Intravenous Hold 10/17/20 1900)  0.9 %  sodium chloride  infusion (Manually program via Guardrails IV Fluids) (has no administration in time range)  HYDROmorphone (DILAUDID) injection 2 mg (has no administration in time range)  sodium chloride 0.9 % bolus 1,000 mL (0 mLs Intravenous Stopped 10/17/20  1852)  acetaminophen (TYLENOL) tablet 650 mg (650 mg Oral Given 10/17/20 2349)    ED Course  I have reviewed the triage vital signs and the nursing notes.  Pertinent labs & imaging results that were available during my care of the patient were reviewed by me and considered in my medical decision making (see chart for details).    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  Observed the movements on exam-appear most consistent with myoclonus, consider gabapentin effect, other toxic-metabolic syndrome. Discussed with Dr. Cheral Marker of Neurology, recommends transfer to [facility] for his evaluation and LTM. MRI ordered and completed prior to transfer shows no acute abnormalities.  Labs are significant for anemia with a hemoglobin of 5.4.  Discussed risks of transfusion, consented and 2 units of PRBCs ordered.  Admitted for continued concern for encephalopathy, anemia, continued evaluation of abnormal movements.      Final Clinical Impression(s) / ED Diagnoses Final diagnoses:  Fever  Anemia, unspecified type  Myoclonus  Somnolence    Rx / DC Orders ED Discharge Orders     None        Gareth Morgan, MD 10/18/20 0126     Gareth Morgan, MD 10/18/20 0127

## 2020-10-17 NOTE — ED Triage Notes (Signed)
Family reports AMS A&Ox1, lethargic, tremors. [facility] 7/14 @ 0600 Pt is ambulatory and A&Ox4 baseline Fall 2 days ago.  [id number]

## 2020-10-17 NOTE — Telephone Encounter (Signed)
Patient's friend, Belenda Cruise, called & stated that patient is shaking "like he is having a seizure."  Friend is concerned because patient was recently in the hospital & was on a renal diet which she has been trying to continue but she feels he is not getting enough fluid.  She is also concerned because of a new prescription for gabapentin the patient has started.  She has called the patient's PCP office but has not received a call back.  Westley Hummer that if she is concerned for the patient's safety, he should go to the ED.  She verbalizes understanding.

## 2020-10-17 NOTE — Telephone Encounter (Signed)
Transition Care Management Unsuccessful Follow-up Telephone Call  Date of discharge and from where:  10/15/2020  Attempts:  2nd Attempt  Reason for unsuccessful TCM follow-up call:  Left voice message

## 2020-10-17 NOTE — ED Notes (Signed)
Called report to [facility] on 12M at [facility]

## 2020-10-17 NOTE — Telephone Encounter (Signed)
Andre Glass is calling again because she said she had not received a call back. I informed her that Dr. Vanessa Pinopolis had attempted to call her and left her a voicemail. She said she would listen to the voicemail.  She would like for Dr. Vanessa Sebastopol to call her back again when he gets a chance.

## 2020-10-17 NOTE — ED Notes (Signed)
[facility]ed [facility] for transport

## 2020-10-17 NOTE — ED Notes (Signed)
Bilateral nephrostomy bags emptied

## 2020-10-17 NOTE — Telephone Encounter (Signed)
Routed to PCP. Andre Glass, CMA  

## 2020-10-17 NOTE — Telephone Encounter (Signed)
Opened in error

## 2020-10-18 ENCOUNTER — Inpatient Hospital Stay (HOSPITAL_COMMUNITY): Payer: Medicare Other

## 2020-10-18 ENCOUNTER — Inpatient Hospital Stay (HOSPITAL_COMMUNITY)
Admit: 2020-10-18 | Discharge: 2020-10-18 | Disposition: A | Discharge status: Home / Self Care | Payer: Medicare Other | Attending: Neurology | Admitting: Neurology

## 2020-10-18 ENCOUNTER — Other Ambulatory Visit: Payer: Self-pay | Admitting: Oncology

## 2020-10-18 DIAGNOSIS — R4182 Altered mental status, unspecified: Secondary | ICD-10-CM | POA: Diagnosis not present

## 2020-10-18 DIAGNOSIS — G253 Myoclonus: Secondary | ICD-10-CM

## 2020-10-18 DIAGNOSIS — R569 Unspecified convulsions: Secondary | ICD-10-CM

## 2020-10-18 DIAGNOSIS — G9340 Encephalopathy, unspecified: Secondary | ICD-10-CM

## 2020-10-18 LAB — COMPREHENSIVE METABOLIC PANEL
ALT: 128 U/L — ABNORMAL HIGH (ref 0–44)
AST: 197 U/L — ABNORMAL HIGH (ref 15–41)
Albumin: 1.7 g/dL — ABNORMAL LOW (ref 3.5–5.0)
Alkaline Phosphatase: 140 U/L — ABNORMAL HIGH (ref 38–126)
Anion gap: 8 (ref 5–15)
BUN: 28 mg/dL — ABNORMAL HIGH (ref 8–23)
CO2: 25 mmol/L (ref 22–32)
Calcium: 8.2 mg/dL — ABNORMAL LOW (ref 8.9–10.3)
Chloride: 103 mmol/L (ref 98–111)
Creatinine, Ser: 1.47 mg/dL — ABNORMAL HIGH (ref 0.61–1.24)
GFR, Estimated: 51 mL/min — ABNORMAL LOW (ref >60)
Glucose, Bld: 136 mg/dL — ABNORMAL HIGH (ref 70–99)
Potassium: 3.7 mmol/L (ref 3.5–5.1)
Sodium: 136 mmol/L (ref 135–145)
Total Bilirubin: 0.8 mg/dL (ref 0.3–1.2)
Total Protein: 6.5 g/dL (ref 6.5–8.1)

## 2020-10-18 LAB — CBC
HCT: 26 % — ABNORMAL LOW (ref 39.0–52.0)
Hemoglobin: 8.3 g/dL — ABNORMAL LOW (ref 13.0–17.0)
MCH: 28.9 pg (ref 26.0–34.0)
MCHC: 31.9 g/dL (ref 30.0–36.0)
MCV: 90.6 fL (ref 80.0–100.0)
Platelets: 330 10*3/uL (ref 150–400)
RBC: 2.87 MIL/uL — ABNORMAL LOW (ref 4.22–5.81)
RDW: 15.6 % — ABNORMAL HIGH (ref 11.5–15.5)
WBC: 16.9 10*3/uL — ABNORMAL HIGH (ref 4.0–10.5)
nRBC: 0 % (ref 0.0–0.2)

## 2020-10-18 LAB — PREPARE RBC (CROSSMATCH)

## 2020-10-18 LAB — HEMOGLOBIN AND HEMATOCRIT, BLOOD
HCT: 29.7 % — ABNORMAL LOW (ref 39.0–52.0)
Hemoglobin: 9.5 g/dL — ABNORMAL LOW (ref 13.0–17.0)

## 2020-10-18 LAB — CK: Total CK: 332 U/L (ref 49–397)

## 2020-10-18 LAB — HEPATITIS PANEL, ACUTE
HCV Ab: NONREACTIVE
Hep A IgM: NONREACTIVE
Hep B C IgM: NONREACTIVE
Hepatitis B Surface Ag: NONREACTIVE

## 2020-10-18 MED ORDER — LINACLOTIDE 145 MCG PO CAPS
290.0000 ug | ORAL_CAPSULE | Freq: Every day | ORAL | Status: DC | PRN
  Administered 2020-10-18: 290 ug via ORAL
  Filled 2020-10-18 (×2): qty 2

## 2020-10-18 MED ORDER — POLYETHYLENE GLYCOL 3350 17 G PO PACK
17.0000 g | PACK | Freq: Two times a day (BID) | ORAL | Status: DC
  Administered 2020-10-18: 17 g via ORAL
  Filled 2020-10-18 (×4): qty 1

## 2020-10-18 MED ORDER — HYDROMORPHONE HCL 1 MG/ML IJ SOLN
2.0000 mg | INTRAMUSCULAR | Status: DC | PRN
  Administered 2020-10-18: 2 mg via INTRAVENOUS
  Filled 2020-10-18: qty 2

## 2020-10-18 MED ORDER — AMLODIPINE BESYLATE 10 MG PO TABS
10.0000 mg | ORAL_TABLET | Freq: Every day | ORAL | Status: DC
  Administered 2020-10-18 – 2020-10-30 (×13): 10 mg via ORAL
  Filled 2020-10-18 (×14): qty 1

## 2020-10-18 MED ORDER — NALOXEGOL OXALATE 12.5 MG PO TABS
12.5000 mg | ORAL_TABLET | Freq: Every day | ORAL | Status: DC
Start: 2020-10-18 — End: 2020-10-22
  Administered 2020-10-18 – 2020-10-21 (×2): 12.5 mg via ORAL
  Filled 2020-10-18 (×6): qty 1

## 2020-10-18 MED ORDER — SODIUM CHLORIDE 0.9% FLUSH: 10.0000 mL | INTRAVENOUS | Status: DC | PRN

## 2020-10-18 MED ORDER — PREDNISONE 5 MG PO TABS
5.0000 mg | ORAL_TABLET | Freq: Every day | ORAL | Status: DC
  Administered 2020-10-18 – 2020-10-30 (×13): 5 mg via ORAL
  Filled 2020-10-18 (×13): qty 1

## 2020-10-18 MED ORDER — SENNOSIDES-DOCUSATE SODIUM 8.6-50 MG PO TABS
2.0000 | ORAL_TABLET | Freq: Every day | ORAL | Status: DC
  Administered 2020-10-18: 2 via ORAL
  Filled 2020-10-18 (×3): qty 2

## 2020-10-18 MED ORDER — HYDROMORPHONE HCL 1 MG/ML IJ SOLN: 2.0000 mg | INTRAMUSCULAR | Status: DC | PRN

## 2020-10-18 MED ORDER — GLYCERIN (LAXATIVE) 2.1 G RE SUPP
1.0000 | Freq: Every day | RECTAL | Status: DC | PRN
  Filled 2020-10-18: qty 1

## 2020-10-18 MED ORDER — TAMSULOSIN HCL 0.4 MG PO CAPS
0.4000 mg | ORAL_CAPSULE | Freq: Every day | ORAL | Status: DC | PRN
  Filled 2020-10-18: qty 1

## 2020-10-18 MED ORDER — PROCHLORPERAZINE EDISYLATE 10 MG/2ML IJ SOLN: 10.0000 mg | Freq: Four times a day (QID) | INTRAMUSCULAR | Status: DC | PRN

## 2020-10-18 MED ORDER — LIDOCAINE-PRILOCAINE 2.5-2.5 % EX CREA: 1.0000 "application " | TOPICAL_CREAM | CUTANEOUS | Status: DC | PRN

## 2020-10-18 MED ORDER — MORPHINE SULFATE (PF) 2 MG/ML IV SOLN
2.0000 mg | INTRAVENOUS | Status: DC | PRN
  Administered 2020-10-18 (×2): 2 mg via INTRAVENOUS
  Filled 2020-10-18 (×2): qty 1

## 2020-10-18 MED ORDER — HYDROMORPHONE HCL 1 MG/ML IJ SOLN
2.0000 mg | INTRAMUSCULAR | Status: DC | PRN
  Administered 2020-10-18 – 2020-10-21 (×13): 2 mg via INTRAVENOUS
  Filled 2020-10-18 (×13): qty 2

## 2020-10-18 MED ORDER — GABAPENTIN 100 MG PO CAPS: 200.0000 mg | ORAL_CAPSULE | Freq: Two times a day (BID) | ORAL | Status: DC

## 2020-10-18 MED ORDER — SODIUM CHLORIDE 0.9 % IV SOLN
1.0000 g | INTRAVENOUS | Status: DC
  Administered 2020-10-19: 1 g via INTRAVENOUS
  Filled 2020-10-18: qty 10

## 2020-10-18 MED ORDER — SODIUM CHLORIDE 0.9% IV SOLUTION: Freq: Once | INTRAVENOUS | Status: AC

## 2020-10-18 MED ORDER — LIDOCAINE HCL (PF) 1 % IJ SOLN
5.0000 mL | Freq: Once | INTRAMUSCULAR | Status: DC
  Filled 2020-10-18: qty 5

## 2020-10-18 MED ORDER — SODIUM CHLORIDE 0.9 % IV SOLN
1.0000 g | Freq: Once | INTRAVENOUS | Status: AC
  Administered 2020-10-18: 1 g via INTRAVENOUS
  Filled 2020-10-18: qty 1

## 2020-10-18 MED ORDER — ONDANSETRON HCL 4 MG/2ML IJ SOLN
4.0000 mg | Freq: Three times a day (TID) | INTRAMUSCULAR | Status: DC | PRN
  Administered 2020-10-18 – 2020-10-25 (×4): 4 mg via INTRAVENOUS
  Filled 2020-10-18 (×4): qty 2

## 2020-10-18 MED ORDER — ENOXAPARIN SODIUM 40 MG/0.4ML IJ SOSY: 40.0000 mg | PREFILLED_SYRINGE | INTRAMUSCULAR | Status: DC

## 2020-10-18 MED ORDER — CHLORHEXIDINE GLUCONATE CLOTH 2 % EX PADS
6.0000 | MEDICATED_PAD | Freq: Every day | CUTANEOUS | Status: DC
  Administered 2020-10-18 – 2020-10-29 (×12): 6 via TOPICAL

## 2020-10-18 MED ORDER — PLECANATIDE 3 MG PO TABS: 3.0000 mg | ORAL_TABLET | Freq: Every day | ORAL | Status: DC

## 2020-10-18 NOTE — Procedures (Signed)
Patient Name: JEEVAN KALLA  MRN: [id number]  Epilepsy Attending: Lora Havens  Referring Physician/Provider: Dr Kerney Elbe Date: 10/18/2020 Duration: 23.08 mins  Patient history: 70 year old male presented with fever, leukocytosis and myoclonic jerking.  EEG to evaluate for seizures.  Level of alertness: Awake  AEDs during EEG study: None  Technical aspects: This EEG study was done with scalp electrodes positioned according to the 10-20 International system of electrode placement. Electrical activity was acquired at a sampling rate of 500Hz  and reviewed with a high frequency filter of 70Hz  and a low frequency filter of 1Hz . EEG data were recorded continuously and digitally stored.   Description: No clear posterior dominant rhythm was seen.  EEG showed continuous generalized polymorphic mixed frequencies with predominantly 5 to 8 Hz theta-alpha activity as well as intermittent generalized 2 to 3 Hz delta slowing. Generalized periodic discharges with triphasic morphology at 1 Hz were also noted intermittently.  Hyperventilation and photic stimulation were not performed.     ABNORMALITY - Periodic discharges with triphasic morphology, generalized ( GPDs) - Continuous slow, generalized  IMPRESSION: This study showed periodic discharges with triphasic morphology at 1 Hz which can be on the ictal-interictal continuum.  However the morphology and the frequency is more likely due to toxic-metabolic causes.  Additionally there is evidence of moderate diffuse encephalopathy, nonspecific etiology. No seizures or definite epileptiform discharges were seen throughout the recording.  Taralyn Ferraiolo Barbra Sarks

## 2020-10-18 NOTE — Progress Notes (Signed)
Family Medicine called for clarification regarding blood administration orders.  Second unit just finished running, there is an order placed around midnight for another unit.  As per family medicine, the patient does not need this third unit. He is to only receive the two units total.

## 2020-10-18 NOTE — Consult Note (Signed)
NEURO HOSPITALIST CONSULT NOTE   Requesting physician: Dr. Ardelia Mems  Reason for Consult: Myoclonic jerking  History obtained from:  Patient and Chart     HPI:                                                                                                                                          Andre Glass is an 70 y.o. male  with a PMHx of locally invasive metastatic prostate cancer s/p TURP in August of 2021, left and right nephrostomies for treatment of bilateral hydronephrosis in June of this year, anemia of chronic disease, nephrolithiasis, recurrent UTI, recent recurrent falls out of bed of unknown etiology, recurrent fecal impaction due to narcotics, who presented to the [facility] ED on Thursday afternoon for evaluation of increased jerking involving his whole body. Patient cannot recall how long ago the jerking initially started, only that it has recently worsened. His brother reported that the jerking "seemed to start" 2 days ago after he had rolled out of bed. He has not had any new medication changes over the last couple of days; however, during his recent hospitalization, he was started on gabapentin, hydromorphone, morphine, as well as medications for constipation. It was also reported that he had seemed sleepier on Thursday.   Per telephone encounter note with CMA on Thursday morning: "[name] calls nurse line requesting to speak with PCP. [name] reports he is taking a lot of morphine and feels he is "jerking" more than usual. [name] reports he has an apt on 7/25 with PCP, but would like to discuss this prior to visit. [name] has questions about his diet as well. He was discharged with orders to follow a renal diet and feels this may be contributing to his "jerkiness," as well. I advised [name] to call his oncologist since they are prescribing the pain meds. However, will forward to PCP as well."   Patient's friend [name] called again at about 2:20 PM to  report worsening as follows: "Patient's friend, Belenda Cruise, called & stated that patient is shaking "like he is having a seizure."  Friend is concerned because patient was recently in the hospital & was on a renal diet which she has been trying to continue but she feels he is not getting enough fluid.  She is also concerned because of a new prescription for gabapentin the patient has started.  She has called the patient's PCP office but has not received a call back.  Westley Hummer that if she is concerned for the patient's safety, he should go to the ED.  She verbalizes understanding."  The patient currently endorses pain to his neck, shoulders, thorax, lower back and limbs -"it hurts everywhere". He has significant tenderness to palpation of his muscle beds in all of these regions.  He does not endorse any focal weakness. No headache. No sensory loss. No facial droop. Appetite has been okay. No abdominal pain.   EDP felt that myoclonic-like movments seen on exam could be due to gabapentin effect or other toxic-metabolic syndrome. He was sent to [facility] for further evaluation, including possible LTM versus spot EEG.   MRI at [facility] showed no acute abnormalities. Patient was noted to be significantly anemic with a hemoglobin of 5.4.    Past Medical History:  Diagnosis Date   Anemia    History of kidney stones    Prostate cancer ([place])    UTI (urinary tract infection)     Past Surgical History:  Procedure Laterality Date   IR IMAGING GUIDED PORT INSERTION  01/31/2020   IR NEPHROSTOMY PLACEMENT LEFT  09/29/2020   IR NEPHROSTOMY PLACEMENT RIGHT  09/29/2020   TRANSURETHRAL RESECTION OF PROSTATE N/A 11/08/2019   Procedure: TRANSURETHRAL RESECTION OF THE PROSTATE (TURP);  Surgeon: [name], MD;  Location: [facility];  Service: Urology;  Laterality: N/A;    Family History  Problem Relation Age of Onset   Stomach cancer Mother    Pancreatic cancer Sister    Breast cancer Neg Hx    Prostate cancer Neg  Hx    Colon cancer Neg Hx               Social History:  reports that he has never smoked. He has never used smokeless tobacco. He reports that he does not drink alcohol and does not use drugs.  Allergies  Allergen Reactions   Ibuprofen Anaphylaxis   Shrimp [Shellfish Allergy] Anaphylaxis    MEDICATIONS:                                                                                                                     Prior to Admission:  Medications Prior to Admission  Medication Sig Dispense Refill Last Dose   amLODipine (NORVASC) 10 MG tablet Take 1 tablet (10 mg total) by mouth daily. 30 tablet 3    gabapentin (NEURONTIN) 100 MG capsule Take 2 capsules (200 mg total) by mouth 2 (two) times daily. 60 capsule 3    glycerin adult 2 g suppository Place 1 suppository rectally as needed for constipation. 25 suppository 0    HYDROmorphone (DILAUDID) 4 MG tablet Take 1 tablet (4 mg total) by mouth every 4 (four) hours as needed for severe pain. 60 tablet 0    lidocaine-prilocaine (EMLA) cream Apply 1 application topically as needed. 30 g 0    linaclotide (LINZESS) 290 MCG CAPS capsule Take 1 capsule (290 mcg total) by mouth daily as needed (constipation, take before breakfast). 30 capsule 2    morphine (MS CONTIN) 60 MG 12 hr tablet Take 1 tablet (60 mg total) by mouth every 8 (eight) hours. 90 tablet 0    Multiple Vitamin (MULTIVITAMIN WITH MINERALS) TABS tablet Take 1 tablet by mouth daily.      naloxegol oxalate (MOVANTIK) 12.5 MG TABS tablet  Take 1 tablet (12.5 mg total) by mouth daily. Take it in the morning 1 hour before breakfast daily for constipation. 30 tablet 3    NONFORMULARY OR COMPOUNDED ITEM Apply 1-2 g topically 4 (four) times daily. 60 each 2    ondansetron (ZOFRAN) 4 MG tablet Take 1 tablet (4 mg total) by mouth 3 (three) times daily as needed for nausea or vomiting. 40 tablet 1    Plecanatide (TRULANCE) 3 MG TABS Take 3 mg by mouth daily. 30 tablet 2    polyethylene glycol  (MIRALAX) 17 g packet Take 17 g by mouth 2 (two) times daily. 60 each 0    predniSONE (DELTASONE) 5 MG tablet Take 1 tablet (5 mg total) by mouth daily with breakfast. 90 tablet 3    prochlorperazine (COMPAZINE) 10 MG tablet Take 1 tablet (10 mg total) by mouth every 6 (six) hours as needed for nausea or vomiting. 30 tablet 0    senna-docusate (SENOKOT-S) 8.6-50 MG tablet Take 2 tablets by mouth daily. 60 tablet 0    tamsulosin (FLOMAX) 0.4 MG CAPS capsule TAKE 1 CAPSULE (0.4 MG TOTAL) BY MOUTH DAILY AS NEEDED (TO HELP WITH URINATION). 30 capsule 0    vitamin C (ASCORBIC ACID) 500 MG tablet Take 1,000 mg by mouth daily.      Vitamin E 268 MG (400 UNIT) CHEW Chew 1 tablet by mouth daily.      Scheduled:  amLODipine  10 mg Oral Daily   Chlorhexidine Gluconate Cloth  6 each Topical Daily   enoxaparin (LOVENOX) injection  40 mg Subcutaneous Q24H   gabapentin  200 mg Oral BID   lidocaine (PF)  5 mL Intradermal Once   naloxegol oxalate  12.5 mg Oral Daily   Plecanatide  3 mg Oral Daily   polyethylene glycol  17 g Oral BID   predniSONE  5 mg Oral Q breakfast   senna-docusate  2 tablet Oral Daily     ROS:                                                                                                                                       As per HPI. Does not endorse any additional complaints.    Blood pressure (!) 106/55, pulse 71, temperature 98.6 F (37 C), temperature source Oral, resp. rate 20, height '5\' 8"'  (1.727 m), weight 84.7 kg, SpO2 96 %.   General Examination:  Physical Exam  HEENT-  [place]/AT. No nuchal rigidity. Kernig and Brudzinski signs negative.  Lungs- Respirations unlabored Extremities- Warm and well perfused.  Musculoskeletal: Tenderness to palpation in paraspinals, muscle beds of limbs x 4.   Neurological Examination Mental Status: Awake with mildly decreased level of  alertness. Speech is fluent with intact naming and comprehension. Oriented to place and time. Mild confusion. Poor memory when relating the time course of his symptoms.  Cranial Nerves: II: Visual fields grossly normal. PERRL.  III,IV, VI: EOMI.  VII: Smile symmetric VIII: hearing intact to voice IX,X: No hoarseness or significant hypophonia XI: Symmetric XII: midline tongue extension Motor: 4+/5 in all 4 extremities without asymmetry. There is significant pain with movement of lower extremities. With greater range of movement, pain is elicited in upper extremities as well.  Sensory: FT intact x 4.  Deep Tendon Reflexes: 1+ and symmetric throughout. Experiences pain with elicitation of reflexes Cerebellar: No gross ataxia noted.  Gait: Deferred Other: No myoclonus or other seizure-like activity noted at time of neurological exam.    Lab Results: Basic Metabolic Panel: Recent Labs  Lab 10/12/20 1623 10/17/20 1743  NA 135 135  K 3.8 3.8  CL 101 99  CO2 27 27  GLUCOSE 132* 147*  BUN 26* 36*  CREATININE 1.22 1.66*  CALCIUM 8.9 8.5*  MG 1.9  --     CBC: Recent Labs  Lab 10/12/20 1623 10/17/20 1743  WBC 12.0* 14.1*  NEUTROABS 10.3* 11.3*  HGB 7.4* 5.4*  HCT 24.2* 17.5*  MCV 92.7 93.1  PLT 293 393    Cardiac Enzymes: No results for input(s): CKTOTAL, CKMB, CKMBINDEX, TROPONINI in the last 168 hours.  Lipid Panel: No results for input(s): CHOL, TRIG, HDL, CHOLHDL, VLDL, LDLCALC in the last 168 hours.  Imaging: CT Head Wo Contrast  Result Date: 10/17/2020 CLINICAL DATA:  70 year old male with head trauma. EXAM: CT HEAD WITHOUT CONTRAST TECHNIQUE: Contiguous axial images were obtained from the base of the skull through the vertex without intravenous contrast. COMPARISON:  Head CT dated 10/15/2020. FINDINGS: Brain: The ventricles and sulci appropriate size for patient's age. Minimal periventricular and deep white matter chronic microvascular ischemic changes noted. There  is no acute intracranial hemorrhage. No mass effect or midline shift. No extra-axial fluid collection. Vascular: No hyperdense vessel or unexpected calcification. Skull: Normal. Negative for fracture or focal lesion. Sinuses/Orbits: No acute finding. Other: None IMPRESSION: No acute intracranial pathology. Electronically Signed   By: Anner Crete M.D.   On: 10/17/2020 19:04   MR BRAIN WO CONTRAST  Result Date: 10/17/2020 CLINICAL DATA:  Initial evaluation for Parkinson's disease. EXAM: MRI HEAD WITHOUT CONTRAST TECHNIQUE: Multiplanar, multiecho pulse sequences of the brain and surrounding structures were obtained without intravenous contrast. COMPARISON:  Prior CT from earlier the same day. FINDINGS: Brain: Examination degraded by motion artifact. Mild diffuse prominence of the CSF containing spaces compatible with generalized cerebral atrophy. Few scattered patchy T2/FLAIR hyperintense foci involving the periventricular and deep white matter both cerebral hemispheres noted, most like related chronic microvascular ischemic disease, mild in nature. Few small remote lacunar infarcts present within the hemispheric cerebral white matter. No abnormal foci of restricted diffusion to suggest acute or subacute ischemia. Gray-white matter differentiation maintained. No encephalomalacia to suggest chronic cortical infarction. No evidence for acute intracranial hemorrhage. Few small chronic micro hemorrhages noted at the left basal ganglia and left parieto-occipital region, of doubtful significance. No mass lesion, midline shift or mass effect. No hydrocephalus or extra-axial fluid collection.  Pituitary gland suprasellar region normal. Midline structures intact. Particular attention made to the region of the midbrain. No significant narrowing or volume loss seen about the pars compact a of the substantia nigra to suggest parkinsonian changes on this motion degraded exam. No abnormal susceptibility artifact to suggest  increased iron deposition. Vascular: Major intracranial vascular flow voids are maintained. Skull and upper cervical spine: Craniocervical junction within normal limits. Bone marrow signal intensity normal. No scalp soft tissue abnormality. Sinuses/Orbits: Globes and orbital soft tissues demonstrate no acute finding. Paranasal sinuses are largely clear. No mastoid effusion. Inner ear structures grossly normal. Other: None. IMPRESSION: 1. No acute intracranial abnormality. No convincing structural changes about the substantia nigra to suggest Parkinson's disease. 2. Mild age-related cerebral atrophy with chronic microvascular ischemic disease. Electronically Signed   By: Jeannine Boga M.D.   On: 10/17/2020 23:42   DG CHEST PORT 1 VIEW  Result Date: 10/18/2020 CLINICAL DATA:  Altered mental status and lethargy, history of recent falls EXAM: PORTABLE CHEST 1 VIEW COMPARISON:  10/15/2020 FINDINGS: Cardiac shadow is stable. Right chest wall port is again seen. Lungs are clear bilaterally. No bony abnormality is noted. IMPRESSION: No acute abnormality noted. Electronically Signed   By: Inez Catalina M.D.   On: 10/18/2020 00:23     Assessment: 70 year old male presenting with fever, leukocytosis and new onset myoclonic jerking. 1. Exam is nonfocal. There is diffuse tenderness of his muscle beds as well as pain in his limbs with passive movement. No meningismus noted. No myoclonus seen at the time of Neurology evaluation.  2. Most likely component of the DDx is felt to be drug-induced myoclonus. Most likely offending agents are his hydromorphone and morphine, both of which have neuroexcitatory effects at high doses and are known precipitants of myoclonus. Neurontin can also have this effect. Other drugs can also cause myoclonus. Per the literature, the most frequently reported classes of drugs causing myoclonus include opiates, antidepressants, antipsychotics, and antibiotics. 3. DDx for his muscle pain  includes myositis and diffuse muscle spasms.  4. Elevated BUN and Cr with eGFR of 44.  5. Ammonia level is normal. Transaminases are elevated.  6. Ca level unremarkable in the setting of low albumin.  7. Mg, K and Na levels are normal.  8. Elevated WBC of 14.1.  9. Severe anemia.  10. MRI brain: No acute intracranial abnormality. No convincing structural changes about the substantia nigra to suggest Parkinson's disease. Mild age-related cerebral atrophy with chronic microvascular ischemic disease.   Recommendations: 1. Limit opiates.  2. Discontinue Neurontin. 3. Consider a trial dose of 5 mg Valium or 2 mg IV Ativan to assess for possible relief of his muscle pain, which may be due to muscle spasms.  4 .CK level to assess for possible myositis.  5. EEG in AM (ordered).     Electronically signed: Dr. Kerney Elbe 10/18/2020, 3:14 AM

## 2020-10-18 NOTE — Progress Notes (Signed)
SLP Cancellation Note  Patient Details Name: Andre Glass MRN: [id number] DOB: 04/02/1951   Cancelled treatment:       Reason Eval/Treat Not Completed: Patient at procedure or test/unavailable (Pt is currently with EEG tech  for EEG. SLP will follow up later.)  [name] I. Hardin Negus, [place], [place] Office number [phone] Pager [place] 10/18/2020, 8:57 AM

## 2020-10-18 NOTE — Progress Notes (Addendum)
FPTS Interim Progress Note  S: Patient is resting in bed, states he feels very cold.  He is asking when he can leave.  He is having a hard time keeping his eyes open and staying awake, continuously closes his eyes and does not answer all questions.  O: BP 129/63 (BP Location: Right Arm)   Pulse 73   Temp 98 F (36.7 C) (Oral)   Resp 19   Ht 5\' 8"  (1.727 m)   Wt 84.7 kg   SpO2 100%   BMI 28.39 kg/m   General: Patient lying in bed, no acute distress, exhibiting continuous myoclonus Cardio: RRR, normal S1/S2 Respiratory: Normal work of breathing on room air, no wheezing no crackles Abdominal: no tenderness to palpation MSK: Muscle strength 5/5  of upper extremities, 3/5 of lower extremities.  Weak handgrip. Neuro: cranial nerves III through XII intact with the exception of absent shoulder shrug with the left shoulder.  Sensation intact.  A/P: Altered mental status/myoclonus Patient is able to state name and location but when asked about the year, states his birthdate.  He is unable to answer some questions and follows some commands but not all.  He is still exhibiting continuous myoclonas. -Neurology consulted, follow recs -Follow blood/urine cultures -Monitor fever curve -EEG today -Continue pain management per home regimen  UTI, obstructive uropathy, hydronephrosis Patient was hospitalized from 6/25-7/2 and had nephrostomy tubes placed.  Creatinine is up to 1.66 from 1.22 about a week ago.  Urine was positive for leukocytes and nitrites. -Follow-up urine culture -Continue ceftriaxone -CMP -Renal ultrasound  [name], DO 10/18/2020, 9:18 AM PGY-1, [facility] Family Medicine Service pager [phone]

## 2020-10-18 NOTE — Evaluation (Signed)
Clinical/Bedside Swallow Evaluation Patient Details  Name: Andre Glass MRN: [id number] Date of Birth: 09-Feb-1951  Today's Date: 10/18/2020 Time: SLP Start Time (ACUTE ONLY): 0941 SLP Stop Time (ACUTE ONLY): 0956 SLP Time Calculation (min) (ACUTE ONLY): 15 min  Past Medical History:  Past Medical History:  Diagnosis Date   Anemia    History of kidney stones    Prostate cancer ([place])    UTI (urinary tract infection)    Past Surgical History:  Past Surgical History:  Procedure Laterality Date   IR IMAGING GUIDED PORT INSERTION  01/31/2020   IR NEPHROSTOMY PLACEMENT LEFT  09/29/2020   IR NEPHROSTOMY PLACEMENT RIGHT  09/29/2020   TRANSURETHRAL RESECTION OF PROSTATE N/A 11/08/2019   Procedure: TRANSURETHRAL RESECTION OF THE PROSTATE (TURP);  Surgeon: [name], MD;  Location: [facility];  Service: Urology;  Laterality: N/A;   HPI:  Pt is a 70 y.o. male who presented with altered mental status and increased myoclonic jerking. MRI brain and CXR negative. Neurology consulted; drug-induced myoclonus suspected. SLP consulted following difficulty swallowing pills in the ED on 7/14. PMH is significant for metastatic prostate cancer, symptomatic anemia, recurrent UTI,  hypertension, and recent admission for obstructive uropathy with bilateral hydronephrosis and placement of bilateral percutaneous nephrostomy tubes.   Assessment / Plan / Recommendation Clinical Impression  Pt was seen for bedside swallow evaluation and he denied a history of dysphagia. Pt's brother arrived during the evaluation and he corroborated these reports. Oral mechanism exam was limited due to pt's difficulty following some commands; however, oral motor strength and ROM appeared grossly WFL and he presented with adequate, natural dentition. He tolerated all solids and liquids without signs or symptoms of oropharyngeal dysphagia. Pills were given whole with water by RN during the evaluation and no difficulty was noted. A regular  texture diet with thin liquids is recommended at this time. Considering pt's noted difficulty on 7/14, SLP will see pt once more to ensure diet tolerance, but it is anticipated that further services will not be needed thereafter. SLP Visit Diagnosis: Dysphagia, unspecified (R13.10)    Aspiration Risk  Mild aspiration risk    Diet Recommendation Regular;Thin liquid   Liquid Administration via: Cup;Straw    Other  Recommendations Oral Care Recommendations: Oral care BID   Follow up Recommendations  (TBD)      Frequency and Duration min 1 x/week  1 week       Prognosis Prognosis for Safe Diet Advancement: Good      Swallow Study   General Date of Onset: 10/17/20 HPI: Pt is a 70 y.o. male who presented with altered mental status and increased myoclonic jerking. MRI brain and CXR negative. Neurology consulted; drug-induced myoclonus suspected. SLP consulted following difficulty swallowing pills in the ED on 7/14. PMH is significant for metastatic prostate cancer, symptomatic anemia, recurrent UTI,  hypertension, and recent admission for obstructive uropathy with bilateral hydronephrosis and placement of bilateral percutaneous nephrostomy tubes. Type of Study: Bedside Swallow Evaluation Previous Swallow Assessment: none Diet Prior to this Study: NPO Temperature Spikes Noted: No Respiratory Status: Room air History of Recent Intubation: No Behavior/Cognition: Alert;Confused;Requires cueing Oral Cavity Assessment: Within Functional Limits Oral Care Completed by SLP: No Oral Cavity - Dentition: Adequate natural dentition Vision: Functional for self-feeding Self-Feeding Abilities: Needs assist Patient Positioning: Upright in bed;Postural control adequate for testing Baseline Vocal Quality: Normal Volitional Cough: Strong;Congested Volitional Swallow: Able to elicit    Oral/Motor/Sensory Function Overall Oral Motor/Sensory Function: Within functional limits  Ice Chips Ice chips:  Within functional limits Presentation: Spoon   Thin Liquid Thin Liquid: Within functional limits Presentation: Straw Other Comments: `    Nectar Thick Nectar Thick Liquid: Not tested   Honey Thick Honey Thick Liquid: Not tested   Puree Puree: Within functional limits Presentation: Spoon   Solid     Solid: Within functional limits Presentation: [place] I. Hardin Negus, [place], [place] Office number [phone] Pager [phone]  Horton Marshall 10/18/2020,10:09 AM

## 2020-10-18 NOTE — TOC Initial Note (Addendum)
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 Patient fluctuating in alertness, Neurology is seeing the patient. Had Home Health RN and Aide with [name]. He has a foley and bilateral neph stents at home.  His is receiving palliative chemotherapy.  CM will follow for needs [name] is aware he in in the hospital.  Expected Discharge Plan: [place] Barriers to Discharge: Continued Medical Work up   Patient Goals and CMS Choice        Expected Discharge Plan and Services Expected Discharge Plan: [place]   Discharge Planning Services: CM Consult   Living arrangements for the past 2 months: [place] Arranged:  (active with Nurse, learning disability for [facility] RN Aide)          Prior Living Arrangements/Services Living arrangements for the past 2 months: Single Family Home Lives with:: Spouse Patient language and need for interpreter reviewed:: Yes        Need for Family Participation in Patient Care: Yes (Comment) Care giver support system in place?: Yes (comment) Current home services: Homehealth aide Criminal Activity/Legal Involvement Pertinent to Current  Situation/Hospitalization: No - Comment as needed  Activities of Daily Living Home Assistive Devices/Equipment: Environmental consultant (specify type), Wheelchair, Hospital bed, Dentures (specify type) (rolling, brother states pt has dentures but unsure what type) ADL Screening (condition at time of admission) Patient's cognitive ability adequate to safely complete daily activities?: Yes Is the patient deaf or have difficulty hearing?: No Does the patient have difficulty seeing, even when wearing glasses/contacts?: Yes Does the patient have difficulty concentrating, remembering, or making decisions?: No Patient able to express need for assistance with ADLs?: Yes Does the patient have difficulty dressing or bathing?: Yes Independently performs ADLs?: No Communication: Independent Dressing (OT): Needs assistance Is this a change from baseline?: Pre-admission baseline Grooming: Needs assistance Is this a change from baseline?: Pre-admission baseline Feeding: Independent Bathing: Needs assistance Is this a change from baseline?: Pre-admission baseline Toileting: Needs assistance Is this a change from baseline?: Pre-admission baseline In/Out Bed: Needs assistance Is this a change from baseline?: Pre-admission baseline Walks in Home: Needs assistance (rolling walker) Is this a change from baseline?: Pre-admission baseline Does the patient have difficulty walking or climbing stairs?: Yes Weakness of Legs: Both Weakness of Arms/Hands: None  Permission Sought/Granted                  Emotional Assessment       Orientation: : Fluctuating Orientation (Suspected and/or reported Sundowners) Alcohol / Substance Use: Not Applicable Psych Involvement: No (comment)  Admission diagnosis:  Fever [R50.9] Symptomatic anemia [D64.9] Altered mental status [R41.82] Patient  Active Problem List   Diagnosis Date Noted   Altered mental status 10/18/2020   Symptomatic anemia 10/17/2020   AKI (acute kidney  injury) ([place]) 09/28/2020   Hydronephrosis 09/28/2020   Obstructive uropathy 09/28/2020   Intractable pain 01/18/2020   BPH (benign prostatic hyperplasia) 11/08/2019   Catheter-associated urinary tract infection ([place]) 09/14/2019   Drug induced constipation 07/18/2019   Goals of care, counseling/discussion 06/26/2019   Metastatic castration-resistant adenocarcinoma of prostate ([place]) 04/19/2019   PCP:  [name], DO Pharmacy:   [id number] - [place], [address] [place] [address] Phone: [phone] Fax: [phone]  [place] [place] [address] Phone: [phone] Fax: [phone]     Social Determinants of Health (SDOH) Interventions    Readmission Risk Interventions Readmission Risk Prevention Plan 10/02/2020  Transportation Screening Complete  PCP or Specialist Appt within 5-7 Days Complete  Home Care Screening Complete  Medication Review (RN CM) Complete  Some recent data might be hidden

## 2020-10-18 NOTE — Progress Notes (Signed)
EEG Completed; Results Pending  

## 2020-10-18 NOTE — Progress Notes (Signed)
Patient's caregiver is Vanetta Shawl.  He is okay with her being updated regularly.  Her phone number is [phone]

## 2020-10-18 NOTE — H&P (Signed)
[facility] Admission History and Physical Service Pager: [phone]  Patient name: Andre Glass Medical record number: [id number] Date of birth: 08-Jul-1950 Age: 70 y.o. Gender: male  Primary Care Provider: [name], DO Consultants: Neurology Code Status: Full Code  Preferred Emergency Contact:  Andre Glass, brother, [phone]  Chief Complaint: Altered mental status myoclonus  Assessment and Plan: Andre Glass is a 70 y.o. male presenting with altered mental status and increased myoclonic jerking. PMH is significant for metastatic prostate cancer, symptomatic anemia, recurrent UTI,  hypertension, and recent admission for obstructive uropathy with bilateral hydronephrosis and placement of bilateral percutaneous nephrostomy tubes.  Altered Mental Status  Myoclonus Patient was initially evaluated at [facility] ED but transferred to [facility] after discussion between ED attending and Andre Glass, neurology, for more extensive neurological work-up including long term EEG monitoring.  Patient febrile to 102.9, with a white count to 14.1 raising concern for infectious process.  Patient cannot recall why he came to the hospital. Per the patient's home health nurse and brother he has had increased myoclonus for the past few days, including an episode when he fell out of bed.  He only has very limited recollection of this event. Blood and urine cultures collected.  Chest x-ray with no acute abnormality. CT head with no evidence of intracranial pathology.  MRI brain with no acute intracranial abnormality, mild age-related cerebral atrophy with chronic microvascular ischemic disease.  Etiology of his altered mental status myoclonus is unclear. I have a high concern for CNS infection given his relative immunocompromised status given his cancer, fever, white count, meningeal signs on exam.  Also consider that patient is on a number of centrally acting medications related to his  cancer treatment, including gabapentin, Dilaudid, morphine, Zofran, Compazine.  According to the patient his dose of gabapentin was increased after his last hospitalization.  In the absence of acute findings on CT head or MRI brain,  have a low concern for brain metastases being the cause of his symptoms today. - Admit to med-tele, attending Dr. Ardelia Glass - Neurology consulted, grateful for their assistance - considered LP given fever and new onset myoclonic movements, Neuro does not recommend based on clinical exam  - Follow blood/urine cutures - Cardiac monitoring  - monitor fever curve - VS per floor protocol  - pain regimen per home regimen  - f/u EEG results    UTI  Obstructive Uropathy  Hydronephrosis  AKI on CKD  Patient had nephrostomy tubes placed during most recent hospitalization from 6/25-7/2.  Creatinine today up to 1.66 from 1.22 at discharge 1 week ago. U/A findings on admission consistent with UTI or pyelonephritis given patient's nephrostomy tubes bilaterally. Urine analysis notable for large leukocytes, mod hemoglobin, and nitrites. Patient also febrile to 102 in both ED and upon arrival to [facility].  - f/u urine culture  - start CTX  - CMP to monitor Creatinine    Metastatic Adenocarcinoma of the Prostate Chronic Cancer Pain Home medication regimen includes oral morphine 60mg  TID and PRN oral Dilaudid 4mg  q4hrs. Patient is currently undergoing palliative chemotherapy treatment. He was reported to have difficulty swallowing pills while in the ED. - Will favor IV medications until patient has swallow screen - Dilaudid and morphine have been ordered at IV equivalent doses  - consider oncology consult during this admission - continue home deltasone  - recommend palliative consult during this admission to discuss Andre Glass   Acute Anemia Anemia of chronic disease Hemoglobin on presentation  5.4. Given 2 units PRBC. Patient is pale appearing on exam.  - ordered 2 units pRBCs   - f/u post H/H  - no pharm DVT ppx at this time   Dysphagia  Patient reported to have difficulty with swallowing tylenol prior to arrival at [facility].  - SLP evaluation  - NPO   Hypertension - Continue home amlodipine 10mg   BPH  Continue home flomax    Chronic Constipation 2/2 Chronic Opioids  Continue home plecanatide  Continue home miralax  Movantik  Continue Senna   Transaminitis  Diffuse Myalgias AST elevated 172 and elevated ALT 99. Unclear etiology but considering wide spread muscle pain can consider some component of myalgias. No statin therapy on record.  - CK  - monitor CMP  - hepatitis panel  - consider RUQ ultrasound   FEN/GI: NPO  Prophylaxis: will hold for now 2/2 to bleeding   Disposition: medical tele   History of Present Illness:  Andre Glass is a 70 y.o. male presenting with increased myoclonic jerking.  Patient reports that he does not remember going to the ED last night but only remembers being home and dropping objects more frequently. He  remembers waking up in the hospital earlier tonight.  Patient reports a hsitory of constipation, has not taken linzess recently.  HE confirms taking 60mg  per day of oral morphine TID as well as dilaudid  He states he has needed more dilaudid since leaving the hospital in the last few weeks.  He reports having 10/10 abdominal pain that ws of sudden onset. He is not sure how long the pain has been happening.  He reports increased confusion since he was in the hospital two weeks ago.  IN regards to increased myoclonic jerking, patient reports myoclonic jerking  that has ben present for one week. Her ports that his hands feel heavy and hurt but he has history of neuropathy. He states that he is  not sure what happened but he had an episode where he woke up on the floor  Denies tobacco, alcohol .   Review Of Systems: Per HPI with the following additions:   Review of Systems  Constitutional:  Positive for fever.  Negative for chills.  HENT:  Positive for rhinorrhea.   Respiratory:  Negative for cough and shortness of breath.   Cardiovascular:  Positive for palpitations. Negative for chest pain.  Gastrointestinal:  Positive for abdominal pain. Negative for nausea and vomiting.  Skin:  Negative for wound.  Neurological:  Negative for headaches.       Jerking movements   Psychiatric/Behavioral:  Positive for confusion.     Patient Active Problem List   Diagnosis Date Noted  . Altered mental status 10/18/2020  . Symptomatic anemia 10/17/2020  . AKI (acute kidney injury) ([place]) 09/28/2020  . Hydronephrosis 09/28/2020  . Obstructive uropathy 09/28/2020  . Intractable pain 01/18/2020  . BPH (benign prostatic hyperplasia) 11/08/2019  . Catheter-associated urinary tract infection ([place]) 09/14/2019  . Drug induced constipation 07/18/2019  . Goals of care, counseling/discussion 06/26/2019  . Metastatic castration-resistant adenocarcinoma of prostate ([place]) 04/19/2019    Past Medical History: Past Medical History:  Diagnosis Date  . Anemia   . History of kidney stones   . Prostate cancer ([place])   . UTI (urinary tract infection)     Past Surgical History: Past Surgical History:  Procedure Laterality Date  . IR IMAGING GUIDED PORT INSERTION  01/31/2020  . IR NEPHROSTOMY PLACEMENT LEFT  09/29/2020  . IR NEPHROSTOMY  PLACEMENT RIGHT  09/29/2020  . TRANSURETHRAL RESECTION OF PROSTATE N/A 11/08/2019   Procedure: TRANSURETHRAL RESECTION OF THE PROSTATE (TURP);  Surgeon: [name], MD;  Location: [facility];  Service: Urology;  Laterality: N/A;    Social History: Social History   Tobacco Use  . Smoking status: Never  . Smokeless tobacco: Never  Vaping Use  . Vaping Use: Never used  Substance Use Topics  . Alcohol use: Never  . Drug use: Never   Additional social history: lives with nephew Andre Glass    Family History: Family History  Problem Relation Age of Onset  . Stomach  cancer Mother   . Pancreatic cancer Sister   . Breast cancer Neg Hx   . Prostate cancer Neg Hx   . Colon cancer Neg Hx     Allergies and Medications: Allergies  Allergen Reactions  . Ibuprofen Anaphylaxis  . Shrimp [Shellfish Allergy] Anaphylaxis   Current Facility-Administered Medications on File Prior to Encounter  Medication Dose Route Frequency Provider Last Rate Last Admin  . heparin lock flush 100 unit/mL  500 Units Intravenous Once Shadad, Firas N, MD      . sodium chloride flush (NS) 0.9 % injection 10 mL  10 mL Intravenous PRN [name], MD       Current Outpatient Medications on File Prior to Encounter  Medication Sig Dispense Refill  . amLODipine (NORVASC) 10 MG tablet Take 1 tablet (10 mg total) by mouth daily. 30 tablet 3  . gabapentin (NEURONTIN) 100 MG capsule Take 2 capsules (200 mg total) by mouth 2 (two) times daily. 60 capsule 3  . glycerin adult 2 g suppository Place 1 suppository rectally as needed for constipation. 25 suppository 0  . HYDROmorphone (DILAUDID) 4 MG tablet Take 1 tablet (4 mg total) by mouth every 4 (four) hours as needed for severe pain. 60 tablet 0  . lidocaine-prilocaine (EMLA) cream Apply 1 application topically as needed. 30 g 0  . linaclotide (LINZESS) 290 MCG CAPS capsule Take 1 capsule (290 mcg total) by mouth daily as needed (constipation, take before breakfast). 30 capsule 2  . morphine (MS CONTIN) 60 MG 12 hr tablet Take 1 tablet (60 mg total) by mouth every 8 (eight) hours. 90 tablet 0  . Multiple Vitamin (MULTIVITAMIN WITH MINERALS) TABS tablet Take 1 tablet by mouth daily.    . naloxegol oxalate (MOVANTIK) 12.5 MG TABS tablet Take 1 tablet (12.5 mg total) by mouth daily. Take it in the morning 1 hour before breakfast daily for constipation. 30 tablet 3  . NONFORMULARY OR COMPOUNDED ITEM Apply 1-2 g topically 4 (four) times daily. 60 each 2  . ondansetron (ZOFRAN) 4 MG tablet Take 1 tablet (4 mg total) by mouth 3 (three) times  daily as needed for nausea or vomiting. 40 tablet 1  . Plecanatide (TRULANCE) 3 MG TABS Take 3 mg by mouth daily. 30 tablet 2  . polyethylene glycol (MIRALAX) 17 g packet Take 17 g by mouth 2 (two) times daily. 60 each 0  . predniSONE (DELTASONE) 5 MG tablet Take 1 tablet (5 mg total) by mouth daily with breakfast. 90 tablet 3  . prochlorperazine (COMPAZINE) 10 MG tablet Take 1 tablet (10 mg total) by mouth every 6 (six) hours as needed for nausea or vomiting. 30 tablet 0  . senna-docusate (SENOKOT-S) 8.6-50 MG tablet Take 2 tablets by mouth daily. 60 tablet 0  . tamsulosin (FLOMAX) 0.4 MG CAPS capsule TAKE 1 CAPSULE (0.4 MG  TOTAL) BY MOUTH DAILY AS NEEDED (TO HELP WITH URINATION). 30 capsule 0  . vitamin C (ASCORBIC ACID) 500 MG tablet Take 1,000 mg by mouth daily.    . Vitamin E 268 MG (400 UNIT) CHEW Chew 1 tablet by mouth daily.      Objective: BP 129/63 (BP Location: Right Arm)   Pulse 73   Temp 98 F (36.7 C) (Oral)   Resp 19   Ht 5\' 8"  (1.727 m)   Wt 84.7 kg   SpO2 100%   BMI 28.39 kg/m  Exam: General: chronically ill appearing male, muscle wasting, pale appearance  Eyes: no scleral icterus ENTM: moist mucous membranes  Neck: initially complains of neck pain with flexion  Cardiovascular: RRR  Respiratory: no increased WOB, no wheezing no crackles, stable on RA  Gastrointestinal: diffuse tenderness to deep palpation  MSK: moves all extremities spontaneously  Derm: no lacerations or rashes noted  Neuro: follows commands, decreased strength throughout, sensation intact   Labs and Imaging: CBC BMET  Recent Labs  Lab 10/18/20 0455  WBC 16.9*  HGB 8.3*  HCT 26.0*  PLT 330   Recent Labs  Lab 10/18/20 0455  NA 136  K 3.7  CL 103  CO2 25  BUN 28*  CREATININE 1.47*  GLUCOSE 136*  CALCIUM 8.2*      Simmons-Robinson, Carolle Ishii, MD 10/18/2020, 7:45 AM PGY-3, [place] Intern pager: [phone], text pages welcome   Pearla Dubonnet, MD PGY-1,  [place]

## 2020-10-19 DIAGNOSIS — B961 Klebsiella pneumoniae [K. pneumoniae] as the cause of diseases classified elsewhere: Secondary | ICD-10-CM

## 2020-10-19 DIAGNOSIS — Z7189 Other specified counseling: Secondary | ICD-10-CM

## 2020-10-19 DIAGNOSIS — Z515 Encounter for palliative care: Secondary | ICD-10-CM | POA: Diagnosis not present

## 2020-10-19 DIAGNOSIS — D649 Anemia, unspecified: Secondary | ICD-10-CM | POA: Diagnosis not present

## 2020-10-19 DIAGNOSIS — G253 Myoclonus: Secondary | ICD-10-CM | POA: Diagnosis not present

## 2020-10-19 DIAGNOSIS — R7881 Bacteremia: Secondary | ICD-10-CM

## 2020-10-19 DIAGNOSIS — R4182 Altered mental status, unspecified: Secondary | ICD-10-CM | POA: Diagnosis not present

## 2020-10-19 LAB — BLOOD CULTURE ID PANEL (REFLEXED) - BCID2

## 2020-10-19 LAB — BPAM RBC
Blood Product Expiration Date: 202208072359
ISSUE DATE / TIME: 202207151040
Unit Type and Rh: 6200

## 2020-10-19 LAB — CBC
HCT: 27.9 % — ABNORMAL LOW (ref 39.0–52.0)
Hemoglobin: 9.1 g/dL — ABNORMAL LOW (ref 13.0–17.0)
MCH: 28.6 pg (ref 26.0–34.0)
MCHC: 32.6 g/dL (ref 30.0–36.0)
MCV: 87.7 fL (ref 80.0–100.0)
Platelets: 364 10*3/uL (ref 150–400)
RBC: 3.18 MIL/uL — ABNORMAL LOW (ref 4.22–5.81)
RDW: 15.6 % — ABNORMAL HIGH (ref 11.5–15.5)
WBC: 21.2 10*3/uL — ABNORMAL HIGH (ref 4.0–10.5)
nRBC: 0 % (ref 0.0–0.2)

## 2020-10-19 LAB — COMPREHENSIVE METABOLIC PANEL
ALT: 91 U/L — ABNORMAL HIGH (ref 0–44)
AST: 66 U/L — ABNORMAL HIGH (ref 15–41)
Albumin: 1.8 g/dL — ABNORMAL LOW (ref 3.5–5.0)
Alkaline Phosphatase: 128 U/L — ABNORMAL HIGH (ref 38–126)
Anion gap: 7 (ref 5–15)
BUN: 29 mg/dL — ABNORMAL HIGH (ref 8–23)
CO2: 26 mmol/L (ref 22–32)
Calcium: 8.3 mg/dL — ABNORMAL LOW (ref 8.9–10.3)
Chloride: 102 mmol/L (ref 98–111)
Creatinine, Ser: 1.51 mg/dL — ABNORMAL HIGH (ref 0.61–1.24)
GFR, Estimated: 49 mL/min — ABNORMAL LOW (ref >60)
Glucose, Bld: 131 mg/dL — ABNORMAL HIGH (ref 70–99)
Potassium: 3.2 mmol/L — ABNORMAL LOW (ref 3.5–5.1)
Sodium: 135 mmol/L (ref 135–145)
Total Bilirubin: 0.7 mg/dL (ref 0.3–1.2)
Total Protein: 6.8 g/dL (ref 6.5–8.1)

## 2020-10-19 LAB — URINE CULTURE

## 2020-10-19 LAB — TYPE AND SCREEN
ABO/RH(D): A POS
Antibody Screen: NEGATIVE
Unit division: 0

## 2020-10-19 MED ORDER — POTASSIUM CHLORIDE CRYS ER 20 MEQ PO TBCR
40.0000 meq | EXTENDED_RELEASE_TABLET | Freq: Once | ORAL | Status: AC
  Administered 2020-10-19: 40 meq via ORAL
  Filled 2020-10-19: qty 2

## 2020-10-19 MED ORDER — SODIUM CHLORIDE 0.9 % IV SOLN
2.0000 g | INTRAVENOUS | Status: DC
  Administered 2020-10-20 – 2020-10-21 (×2): 2 g via INTRAVENOUS
  Filled 2020-10-19 (×2): qty 20

## 2020-10-19 MED ORDER — ACETAMINOPHEN 325 MG PO TABS
650.0000 mg | ORAL_TABLET | Freq: Once | ORAL | Status: AC
  Administered 2020-10-20: 650 mg via ORAL
  Filled 2020-10-19: qty 2

## 2020-10-19 NOTE — Progress Notes (Signed)
PHARMACY - PHYSICIAN COMMUNICATION CRITICAL VALUE ALERT - BLOOD CULTURE IDENTIFICATION (BCID)  Andre Glass is an 70 y.o. male who presented to [facility] on 10/17/2020 with a chief complaint of AMS, UTI.  Assessment:  Pt with kleb pneumo bacteremia (likely source - UTI)  Name of physician (or Provider) Contacted: Dr. Joelyn Oms  Current antibiotics: Ceftriaxone 1gm IV q24h  Changes to prescribed antibiotics recommended:  Change ceftriaxone to 2gm IV Q24h  Results for orders placed or performed during the hospital encounter of 10/17/20  Blood Culture ID Panel (Reflexed) (Collected: 10/18/2020  6:24 AM)  Result Value Ref Range   Enterococcus faecalis NOT DETECTED NOT DETECTED   Enterococcus Faecium NOT DETECTED NOT DETECTED   Listeria monocytogenes NOT DETECTED NOT DETECTED   Staphylococcus species NOT DETECTED NOT DETECTED   Staphylococcus aureus (BCID) NOT DETECTED NOT DETECTED   Staphylococcus epidermidis NOT DETECTED NOT DETECTED   Staphylococcus lugdunensis NOT DETECTED NOT DETECTED   Streptococcus species NOT DETECTED NOT DETECTED   Streptococcus agalactiae NOT DETECTED NOT DETECTED   Streptococcus pneumoniae NOT DETECTED NOT DETECTED   Streptococcus pyogenes NOT DETECTED NOT DETECTED   A.calcoaceticus-baumannii NOT DETECTED NOT DETECTED   Bacteroides fragilis NOT DETECTED NOT DETECTED   Enterobacterales DETECTED (A) NOT DETECTED   Enterobacter cloacae complex NOT DETECTED NOT DETECTED   Escherichia coli NOT DETECTED NOT DETECTED   Klebsiella aerogenes NOT DETECTED NOT DETECTED   Klebsiella oxytoca NOT DETECTED NOT DETECTED   Klebsiella pneumoniae DETECTED (A) NOT DETECTED   Proteus species NOT DETECTED NOT DETECTED   Salmonella species NOT DETECTED NOT DETECTED   Serratia marcescens NOT DETECTED NOT DETECTED   Haemophilus influenzae NOT DETECTED NOT DETECTED   Neisseria meningitidis NOT DETECTED NOT DETECTED   Pseudomonas aeruginosa NOT DETECTED NOT DETECTED    Stenotrophomonas maltophilia NOT DETECTED NOT DETECTED   Candida albicans NOT DETECTED NOT DETECTED   Candida auris NOT DETECTED NOT DETECTED   Candida glabrata NOT DETECTED NOT DETECTED   Candida krusei NOT DETECTED NOT DETECTED   Candida parapsilosis NOT DETECTED NOT DETECTED   Candida tropicalis NOT DETECTED NOT DETECTED   Cryptococcus neoformans/gattii NOT DETECTED NOT DETECTED   CTX-M ESBL NOT DETECTED NOT DETECTED   Carbapenem resistance IMP NOT DETECTED NOT DETECTED   Carbapenem resistance KPC NOT DETECTED NOT DETECTED   Carbapenem resistance NDM NOT DETECTED NOT DETECTED   Carbapenem resist OXA 48 LIKE NOT DETECTED NOT DETECTED   Carbapenem resistance VIM NOT DETECTED NOT DETECTED    Sherlon Handing, PharmD, BCPS Please see amion for complete clinical pharmacist phone list 10/19/2020  4:11 AM

## 2020-10-19 NOTE — Progress Notes (Signed)
Family Medicine Teaching Service Daily Progress Note Intern Pager: [phone]  Patient name: Andre Glass Medical record number: [id number] Date of birth: 08-Mar-1951 Age: 70 y.o. Gender: male  Primary Care Provider: [name], DO Consultants: Neurology Code Status: Full  Pt Overview and Major Events to Date:  7/15-admitted  Assessment and Plan: Andre Glass is a 70 y.o. male presenting with altered mental status and increased myoclonic jerking. PMH is significant for metastatic prostate cancer, symptomatic anemia, recurrent UTI,  hypertension, and recent admission for obstructive uropathy with bilateral hydronephrosis and placement of bilateral percutaneous nephrostomy tubes.  AMS-resolved  myoclonus AMS seems resolved.  Patient alert and oriented x4 this morning.  Myoclonus also improved. Neurology following, EEG yesterday with periodic discharges with triphasic morphology at 1 Hz which can be possibly due to toxic-metabolic causes per the documentation as well as evidence of moderate diffuse encephalopathy that is nonspecific with no seizures or definite epileptiform discharges seen. -Neurology following, appreciate recommendations  UTI  obstructive uropathy  concern for local invasion of advanced prostate carcinoma into the bladder Now showing Klebsiella and blood cultures.  Ceftriaxone has been increased to 2 g every 24 hours.  Urine culture with multiple species present.  Renal ultrasound performed yesterday with thickened bladder with masslike echogenicity within the lumen of the bladder concerning for bladder neoplasm or hematoma, mild right hydronephrosis without obstructing lesion, right kidney and bladder findings similar to CT on 6/25 with concern for advanced prostate carcinoma with focal invasion of the bladder, left kidney normal. -Monitor for fevers -Ceftriaxone as above -Discussed bladder/renal ultrasound findings with urology.  They reviewed his chart and were kind enough  to provide recommendations.  They said that they would not scope or do anything in the hospital during this time and that he is essentially being managed by medical oncology in the outpatient setting and so may not need any urology follow-up pending his ongoing treatment with medical oncology. -Spoke with patient about possibility of palliative consult but patient is interested in this to discuss goals of care particularly for future hospitalizations.  We will place this consult  Klebsiella bacteremia Continues on ceftriaxone which has been increased to 2 g every 24 hour.  WBC increased at 21.2.  Afebrile over last 24 hours -Ceftriaxone as above -Monitor for fevers  Metastatic adenocarcinoma prostate  chronic cancer pain Continues on Dilaudid 2 mg IV every 4 hours as needed, -Palliative consult as above  ?Acute anemia on chronic anemia AM hemoglobin of 9.1.  Hemoglobin on presentation of 5.4, the patient was given 2 units packed RBCs which raise the hemoglobin to 9.5.  There was some concern that the initial hemoglobin of 5.4 was not accurate due to the fact that the post H&H hemoglobin improved to 9.5 after 2 units.  Patient's baseline seems to be around 7.5-8.5 and so he may have actually been around baseline at the time of admission if the 5.4 hemoglobin was incorrect. -AM CBC -SCDs for DVT prophylaxis -Consider GI consult if there are definite signs of hemoglobin dropping  Transaminitis: AST improved from 197 > 66, ALT improved from 128 > 91.  Hepatitis panel negative.  Liver ultrasound with no focal lesion identified, portal vein patent with normal directional blood flow.  Unclear etiology but may be due to medications versus generalized inflammation with his bacteremia and malignancy -AM CMP -Continue to monitor  Dysphagia SLP recommends regular diet  Hypokalemia Potassium of 3.2.  Will replace with 29meq Kdur.  Hypertension Continue home amlodipine,  most recent BP  118/65. -Continue amlodipine  BPH Continues on Flomax  Chronic constipation secondary to chronic opioids Continue home plecanatide, continue MiraLAX, Movantik, senna.  FEN/GI: Regular PPx: SCDs Dispo:Home in 2-3 days. Barriers include continued medical work-up.   Subjective:  Patient appears much better this morning.  He is alert and oriented x4.  He has pain well controlled at this time.  I discussed his hospital course with him in detail.  He did not remember talking with me yesterday when he was altered.  Objective: Temp:  [97.6 F (36.4 C)-99.9 F (37.7 C)] 99.9 F (37.7 C) (07/16 0600) Pulse Rate:  [68-88] 68 (07/16 0600) Resp:  [17-20] 20 (07/16 0600) BP: (114-129)/(54-87) 118/65 (07/16 0600) SpO2:  [98 %-100 %] 98 % (07/16 0600) Physical Exam: General: Alert and oriented in no apparent distress Heart: Regular rate and rhythm with no murmurs appreciated Lungs: CTA bilaterally Abdomen: Bowel sounds present, minor abdominal discomfort on palpation which he states is chronic, nephrostomy tubes present with no obvious bleeding or drainage Skin: Warm and dry  Laboratory: Recent Labs  Lab 10/17/20 1743 10/18/20 0455 10/18/20 1820 10/19/20 0433  WBC 14.1* 16.9*  --  21.2*  HGB 5.4* 8.3* 9.5* 9.1*  HCT 17.5* 26.0* 29.7* 27.9*  PLT 393 330  --  364   Recent Labs  Lab 10/17/20 1743 10/18/20 0455 10/19/20 0433  NA 135 136 135  K 3.8 3.7 3.2*  CL 99 103 102  CO2 27 25 26   BUN 36* 28* 29*  CREATININE 1.66* 1.47* 1.51*  CALCIUM 8.5* 8.2* 8.3*  PROT 7.5 6.5 6.8  BILITOT 0.3 0.8 0.7  ALKPHOS 153* 140* 128*  ALT 99* 128* 91*  AST 172* 197* 66*  GLUCOSE 147* 136* 131*     [name], DO 10/19/2020, 7:50 AM PGY-3, [place] Intern pager: [phone], text pages welcome

## 2020-10-19 NOTE — Progress Notes (Addendum)
Neurology Progress Note  Brief HPI: 69 y.o. male with PMHx of locally metastatic prostate cancer s/p TURP in August of 2021, bilateral hydronephrosis s/p R/L nephrotomies in June of 2022, anemia of chronic disease, nephrolithiasis, recurrent UTI, recurrent falls of unknown etiology, recurrent fecal impaction 2/2 narcotic use who presented to [facility] 10/18/2020 for evaluation of 2 days of generalized myoclonic jerking and increasing lethargy. Home medications include hydromorphone, morphine, and gabapentin.   Interval history: Initial work up revealed patient with generalized pain, tenderness to palpation of muscle beds throughout, significant anemia with an initial hemoglobin of 5.4, elevated BUN and Cr with eGFR of 44, normal ammonia, elevated transaminases, leukocytosis with WBC of 14.1, and MRI brain without acute intracranial abnormality.   Subjective: No acute overnight events Received 2 units of PRBC with improvement in hemoglobin to 9.1 on AM labs Patient endorses resolution of jerking movements   Exam: Vitals:   10/18/20 2044 10/19/20 0600  BP: 114/61 118/65  Pulse: 80 68  Resp: 19 20  Temp: 98.8 F (37.1 C) 99.9 F (37.7 C)  SpO2: 98% 98%   Gen: Sitting up in bed with family member at bedside, in no acute distress Resp: non-labored breathing, no respiratory distress, on room air Abd: soft, non-distended, non-tender  Neuro: Mental Status: Awake, alert, and oriented to person, place, age, month, year, and situation. He is able to provide a clear and coherent history of present illness.  Speech is intact without dysarthria or aphasia No neglect is noted Cranial Nerves: PERRL 4 mm / brisk, EOMI without gaze preference, ptosis, or nystagmus, visual fields are full, facial sensation is intact and symmetric to light touch, face is symmetric resting and smiling, hearing is intact to voice, shoulder shrug is symmetric, phonation intact, palate elevates symmetrically, tongue protrudes  midline without fasciculations  Motor: 4+/5 strength in bilateral lower extremities without asymmetry, distal weakness 3/5 ankle plantar and dorsiflexion- pain limited lower extremity strength assessment.  5/5 strength present in bilateral upper extremities without vertical drift on assessment.  Tone and bulk are normal.  No myoclonic jerking or tremors noted throughout.  Sensory: Sensation intact and symmetric to light touch in bilateral upper and lower extremities DTR: 2+ and symmetric biceps, brachioradialis, 1+ and symmetric patellae Gait: Able to stand and ambulate with minor instability with assistance from a walker  Pertinent Labs: CBC    Component Value Date/Time   WBC 21.2 (H) 10/19/2020 0433   RBC 3.18 (L) 10/19/2020 0433   HGB 9.1 (L) 10/19/2020 0433   HGB 8.2 (L) 09/17/2020 1050   HCT 27.9 (L) 10/19/2020 0433   PLT 364 10/19/2020 0433   PLT 306 09/17/2020 1050   MCV 87.7 10/19/2020 0433   MCH 28.6 10/19/2020 0433   MCHC 32.6 10/19/2020 0433   RDW 15.6 (H) 10/19/2020 0433   LYMPHSABS 1.4 10/17/2020 1743   MONOABS 1.1 (H) 10/17/2020 1743   EOSABS 0.1 10/17/2020 1743   BASOSABS 0.0 10/17/2020 1743   CMP     Component Value Date/Time   NA 135 10/19/2020 0433   K 3.2 (L) 10/19/2020 0433   CL 102 10/19/2020 0433   CO2 26 10/19/2020 0433   GLUCOSE 131 (H) 10/19/2020 0433   BUN 29 (H) 10/19/2020 0433   CREATININE 1.51 (H) 10/19/2020 0433   CREATININE 1.44 (H) 09/17/2020 1050   CALCIUM 8.3 (L) 10/19/2020 0433   PROT 6.8 10/19/2020 0433   ALBUMIN 1.8 (L) 10/19/2020 0433   AST 66 (H) 10/19/2020 0433   AST 15  09/17/2020 1050   ALT 91 (H) 10/19/2020 0433   ALT 7 09/17/2020 1050   ALKPHOS 128 (H) 10/19/2020 0433   BILITOT 0.7 10/19/2020 0433   BILITOT 0.2 (L) 09/17/2020 1050   GFRNONAA 49 (L) 10/19/2020 0433   GFRNONAA 52 (L) 09/17/2020 1050   GFRAA >60 11/29/2019 1306   Urinalysis    Component Value Date/Time   COLORURINE YELLOW 10/17/2020 2230   APPEARANCEUR  CLEAR 10/17/2020 2230   LABSPEC 1.006 10/17/2020 2230   PHURINE 7.0 10/17/2020 2230   GLUCOSEU NEGATIVE 10/17/2020 2230   HGBUR MODERATE (A) 10/17/2020 2230   BILIRUBINUR NEGATIVE 10/17/2020 2230   BILIRUBINUR negative 09/14/2019 1105   KETONESUR NEGATIVE 10/17/2020 2230   PROTEINUR 30 (A) 10/17/2020 2230   UROBILINOGEN 0.2 09/14/2019 1105   NITRITE POSITIVE (A) 10/17/2020 2230   LEUKOCYTESUR LARGE (A) 10/17/2020 2230   Drugs of Abuse  No results found for: LABOPIA, COCAINSCRNUR, LABBENZ, AMPHETMU, THCU, LABBARB  Results for orders placed or performed during the hospital encounter of 10/17/20  Resp Panel by RT-PCR (Flu A&B, Covid) Nasopharyngeal Swab     Status: None   Collection Time: 10/17/20  8:42 PM   Specimen: Nasopharyngeal Swab; Nasopharyngeal(NP) swabs in vial transport medium  Result Value Ref Range Status   SARS Coronavirus 2 by RT PCR NEGATIVE NEGATIVE Final    Comment: (NOTE) SARS-CoV-2 target nucleic acids are NOT DETECTED.  The SARS-CoV-2 RNA is generally detectable in upper respiratory specimens during the acute phase of infection. The lowest concentration of SARS-CoV-2 viral copies this assay can detect is 138 copies/mL. A negative result does not preclude SARS-Cov-2 infection and should not be used as the sole basis for treatment or other patient management decisions. A negative result may occur with  improper specimen collection/handling, submission of specimen other than nasopharyngeal swab, presence of viral mutation(s) within the areas targeted by this assay, and inadequate number of viral copies(<138 copies/mL). A negative result must be combined with clinical observations, patient history, and epidemiological information. The expected result is Negative.  Fact Sheet for Patients:  EntrepreneurPulse.com.au  Fact Sheet for Healthcare Providers:  IncredibleEmployment.be  This test is no t yet approved or cleared by the  [place] FDA and  has been authorized for detection and/or diagnosis of SARS-CoV-2 by FDA under an Emergency Use Authorization (EUA). This EUA will remain  in effect (meaning this test can be used) for the duration of the COVID-19 declaration under Section 564(b)(1) of the Act, 21 U.S.C.section 360bbb-3(b)(1), unless the authorization is terminated  or revoked sooner.       Influenza A by PCR NEGATIVE NEGATIVE Final   Influenza B by PCR NEGATIVE NEGATIVE Final    Comment: (NOTE) The Xpert Xpress SARS-CoV-2/FLU/RSV plus assay is intended as an aid in the diagnosis of influenza from Nasopharyngeal swab specimens and should not be used as a sole basis for treatment. Nasal washings and aspirates are unacceptable for Xpert Xpress SARS-CoV-2/FLU/RSV testing.  Fact Sheet for Patients: EntrepreneurPulse.com.au  Fact Sheet for Healthcare Providers: IncredibleEmployment.be  This test is not yet approved or cleared by the [place] FDA and has been authorized for detection and/or diagnosis of SARS-CoV-2 by FDA under an Emergency Use Authorization (EUA). This EUA will remain in effect (meaning this test can be used) for the duration of the COVID-19 declaration under Section 564(b)(1) of the Act, 21 U.S.C. section 360bbb-3(b)(1), unless the authorization is terminated or revoked.  Performed at [facility], [address] [place], [place]  [address]   Urine Culture     Status: Abnormal   Collection Time: 10/17/20 11:22 PM   Specimen: Urine, Catheterized  Result Value Ref Range Status   Specimen Description   Final    URINE, CATHETERIZED Performed at [facility] [address] [place], [place] [address]    Special Requests   Final    NONE Performed at [facility], [place] [address] [place], [place] [address]    Culture MULTIPLE SPECIES PRESENT, SUGGEST RECOLLECTION (A)  Final    Report Status 10/19/2020 FINAL  Final  Culture, blood (routine x 2)     Status: None (Preliminary result)   Collection Time: 10/18/20  6:15 AM   Specimen: BLOOD  Result Value Ref Range Status   Specimen Description BLOOD RIGHT ANTECUBITAL  Final   Special Requests   Final    BOTTLES DRAWN AEROBIC ONLY Blood Culture adequate volume   Culture  Setup Time   Final    GRAM NEGATIVE RODS AEROBIC BOTTLE ONLY CRITICAL VALUE NOTED.  VALUE IS CONSISTENT WITH PREVIOUSLY REPORTED AND CALLED VALUE. Performed at [facility], [place] [address], [place], [place] [address]    Culture GRAM NEGATIVE RODS  Final   Report Status PENDING  Incomplete  Culture, blood (routine x 2)     Status: Abnormal (Preliminary result)   Collection Time: 10/18/20  6:24 AM   Specimen: BLOOD LEFT HAND  Result Value Ref Range Status   Specimen Description BLOOD LEFT HAND  Final   Special Requests   Final    BOTTLES DRAWN AEROBIC AND ANAEROBIC Blood Culture results may not be optimal due to an inadequate volume of blood received in culture bottles   Culture  Setup Time   Final    GRAM NEGATIVE RODS ANAEROBIC BOTTLE ONLY Organism ID to follow CRITICAL RESULT CALLED TO, READ BACK BY AND VERIFIED WITH: K. AMEND PHARMD, AT 0272 10/19/20 D. VANHOOK    Culture (A)  Final    KLEBSIELLA PNEUMONIAE SUSCEPTIBILITIES TO FOLLOW Performed at [facility], [place] [address], [place], [place] [address]    Report Status PENDING  Incomplete  Blood Culture ID Panel (Reflexed)     Status: Abnormal   Collection Time: 10/18/20  6:24 AM  Result Value Ref Range Status   Enterococcus faecalis NOT DETECTED NOT DETECTED Final   Enterococcus Faecium NOT DETECTED NOT DETECTED Final   Listeria monocytogenes NOT DETECTED NOT DETECTED Final   Staphylococcus species NOT DETECTED NOT DETECTED Final   Staphylococcus aureus (BCID) NOT DETECTED NOT DETECTED Final   Staphylococcus epidermidis NOT DETECTED NOT DETECTED Final   Staphylococcus  lugdunensis NOT DETECTED NOT DETECTED Final   Streptococcus species NOT DETECTED NOT DETECTED Final   Streptococcus agalactiae NOT DETECTED NOT DETECTED Final   Streptococcus pneumoniae NOT DETECTED NOT DETECTED Final   Streptococcus pyogenes NOT DETECTED NOT DETECTED Final   A.calcoaceticus-baumannii NOT DETECTED NOT DETECTED Final   Bacteroides fragilis NOT DETECTED NOT DETECTED Final   Enterobacterales DETECTED (A) NOT DETECTED Final    Comment: Enterobacterales represent a large order of gram negative bacteria, not a single organism. CRITICAL RESULT CALLED TO, READ BACK BY AND VERIFIED WITH: K. AMEND PHARMD, AT 0407 10/19/20 D. VANHOOK    Enterobacter cloacae complex NOT DETECTED NOT DETECTED Final   Escherichia coli NOT DETECTED NOT DETECTED Final   Klebsiella aerogenes NOT DETECTED NOT DETECTED Final   Klebsiella oxytoca NOT DETECTED NOT DETECTED Final   Klebsiella pneumoniae  DETECTED (A) NOT DETECTED Final    Comment: CRITICAL RESULT CALLED TO, READ BACK BY AND VERIFIED WITH: K. AMEND PHARMD, AT 3419 10/19/20 D. VANHOOK    Proteus species NOT DETECTED NOT DETECTED Final   Salmonella species NOT DETECTED NOT DETECTED Final   Serratia marcescens NOT DETECTED NOT DETECTED Final   Haemophilus influenzae NOT DETECTED NOT DETECTED Final   Neisseria meningitidis NOT DETECTED NOT DETECTED Final   Pseudomonas aeruginosa NOT DETECTED NOT DETECTED Final   Stenotrophomonas maltophilia NOT DETECTED NOT DETECTED Final   Candida albicans NOT DETECTED NOT DETECTED Final   Candida auris NOT DETECTED NOT DETECTED Final   Candida glabrata NOT DETECTED NOT DETECTED Final   Candida krusei NOT DETECTED NOT DETECTED Final   Candida parapsilosis NOT DETECTED NOT DETECTED Final   Candida tropicalis NOT DETECTED NOT DETECTED Final   Cryptococcus neoformans/gattii NOT DETECTED NOT DETECTED Final   CTX-M ESBL NOT DETECTED NOT DETECTED Final   Carbapenem resistance IMP NOT DETECTED NOT DETECTED Final    Carbapenem resistance KPC NOT DETECTED NOT DETECTED Final   Carbapenem resistance NDM NOT DETECTED NOT DETECTED Final   Carbapenem resist OXA 48 LIKE NOT DETECTED NOT DETECTED Final   Carbapenem resistance VIM NOT DETECTED NOT DETECTED Final    Comment: Performed at [facility], [address], [place], [place] [address]   Ammonia 10/17/2020: 12  Lab Results  Component Value Date   CKTOTAL 332 10/18/2020   Imaging Reviewed:  CTH WO contrast 10/17/2020: No acute intracranial pathology.  MRI Brain WO contrast 10/17/2020: 1. No acute intracranial abnormality. No convincing structural changes about the substantia nigra to suggest Parkinson's disease. 2. Mild age-related cerebral atrophy with chronic microvascular ischemic disease.  EEG 10/18/2020: "This study showed periodic discharges with triphasic morphology at 1 Hz which can be on the ictal-interictal continuum.  However the morphology and the frequency is more likely due to toxic-metabolic causes.  Additionally there is evidence of moderate diffuse encephalopathy, nonspecific etiology. No seizures or definite epileptiform discharges were seen throughout the recording."  Assessment: 70 year old male presenting with fever, leukocytosis and new onset myoclonic jerking. - Examination reveals patient with ongoing tenderness of muscle beds and pain in lower extremities with movement. There is no evidence of myoclonus on examination. Patient endorses resolution of jerking movements and lethargy since discontinuation of Neurontin.  - Work up reveals MRI brain and CTH without acute intracranial abnormality and EEG with periodic discharges with triphasic morphology that can be on the ictal-interictal continuum but is more frequently seen with toxic-metabolic causes. Patient does have known infectious/toxic-metabolic derangements with a persistent leukocytosis, UTI, Klebsiella bacteremia, elevated BUN and Cr with eGFR of 44 on admission,  polypharmacy, transaminases elevation, and severe anemia.  - 10/19/2020 with improvement in amenia to Hgb of 9.5, improvement in transaminitis, and patient remains on antibiotics for klebsiella bacteremia and UTI with multiple species with persistent leukocytosis but patient has remained afebrile x 24h.  - Presentation is felt to be most consistent with drug-induced myoclonus. Most likely offending agents are his hydromorphone and morphine, both of which have neuroexcitatory effects at high doses and are known precipitants of myoclonus. Neurontin can also have this effect. Other drugs can also cause myoclonus. Per the literature, the most frequently reported classes of drugs causing myoclonus include opiates, antidepressants, antipsychotics, and antibiotics. DDx for his muscle pain includes myositis and diffuse muscle spasms, though total CK level obtained without elevation to support a diagnosis of myositis.  Impression:  Concern for myoclonic jerking; likely drug-induced - resolved Acute encephalopathy with lethargy - resolved Polypharmacy   Recommendations: - Continue to limit opiates - Improvement with discontinuation of Neurontin, would continue to hold at discharge - No further neurology work up or recommendations at this time. Neurology will be available on an as needed basis for further questions or concerns.   Anibal Henderson, AGACNP-BC [facility] [phone]  Patient seen by NP and discussed with me, billing per NP

## 2020-10-19 NOTE — Evaluation (Signed)
Physical Therapy Evaluation Patient Details Name: Andre Glass MRN: [id number] DOB: [birth year] Today's Date: 10/19/2020   History of Present Illness  The pt is a 70 yo male presenting 7/14 with AMS and "jerking" movements. The pt was reccently admitted for obstructive uropathy with bilateral hydronephrosis and is now s/p placement of percutaneous nephrostomy tubes bilaterally. Work up this admission revealed UTI (klebsiella). PMH includes: metastatic adenocarinoma of prostate, chornic pain, HTN, and CKD.   Clinical Impression  Pt in bed upon arrival of PT, agreeable to evaluation at this time. Prior to admission the pt was ambulating in the home with use of a rollator and supervision from family or personal care attendant. The pt now presents with limitations in functional mobility, strength, power, stability, and activity tolerance due to above dx, and will continue to benefit from skilled PT, but reports he feels close to his baseline mobility and will therefore be safe to return home with previous levels of assist and DME.     Follow Up Recommendations Home health PT;Supervision for mobility/OOB    Equipment Recommendations  None recommended by PT (pt well equipped)    Recommendations for Other Services       Precautions / Restrictions Precautions Precautions: Fall Precaution Comments: bilateral nephrostomy Restrictions Weight Bearing Restrictions: No      Mobility  Bed Mobility Overal bed mobility: Modified Independent             General bed mobility comments: supervision for safety, no physical assist given from flat bed    Transfers Overall transfer level: Needs assistance Equipment used: None Transfers: Sit to/from [employer] Transfers Sit to Stand: Min guard Stand pivot transfers: Min guard       General transfer comment: minG for safety, no LOB with pivot to [facility]. pt with good use of armrests to power up  Ambulation/Gait Ambulation/Gait assistance:  Min guard;Min assist Gait Distance (Feet): 35 Feet Assistive device: 1 person hand held assist;None Gait Pattern/deviations: Step-through pattern;Wide base of support;Staggering left Gait velocity: decreased Gait velocity interpretation: <1.31 ft/sec, indicative of household ambulator General Gait Details: pt with single LOB after tripping on BSC. will benefit from use of RW for improved stability. slowed gait with increased lateral movements and generally mildly unstable     Balance Overall balance assessment: Needs assistance Sitting-balance support: No upper extremity supported;Feet supported Sitting balance-Leahy Scale: Good     Standing balance support: Single extremity supported;During functional activity Standing balance-Leahy Scale: Fair Standing balance comment: able to static stand without assist, at least single UE support                             Pertinent Vitals/Pain Pain Assessment: Faces Faces Pain Scale: Hurts a little bit Pain Location: R abdomen Pain Descriptors / Indicators: Discomfort;Sore Pain Intervention(s): Limited activity within patient's tolerance;Monitored during session;Repositioned    Home Living Family/patient expects to be discharged to:: Private residence Living Arrangements: Other relatives (neice and her family) Available Help at Discharge: Family;Personal care attendant (care attendant 6 hours during weakday) Type of Home: House Home Access: Stairs to enter Entrance Stairs-Rails: None Entrance Stairs-Number of Steps: 2 Home Layout: One level Home Equipment: Walker - 4 wheels;Walker - 2 wheels;Cane - quad;Shower seat;Grab bars - tub/shower;Bedside commode;Wheelchair - manual;Hospital bed      Prior Function Level of Independence: Needs assistance   Gait / Transfers Assistance Needed: reports ambulation with rolaltor within the house and for community distances, [facility]  for doctors apt  ADL's / Homemaking Assistance Needed: care  attendant assists with meals, showering        Hand Dominance   Dominant Hand: Right    Extremity/Trunk Assessment   Upper Extremity Assessment Upper Extremity Assessment: Generalized weakness    Lower Extremity Assessment Lower Extremity Assessment: Generalized weakness    Cervical / Trunk Assessment Cervical / Trunk Assessment: Normal  Communication   Communication: No difficulties  Cognition Arousal/Alertness: Awake/alert Behavior During Therapy: WFL for tasks assessed/performed;Flat affect Overall Cognitive Status: Within Functional Limits for tasks assessed                                 General Comments: pt A&Ox4, does require increased time to answer open ended questions at times      General Comments General comments (skin integrity, edema, etc.): VSS on RA        Assessment/Plan    PT Assessment Patient needs continued PT services  PT Problem List Decreased strength;Decreased range of motion;Decreased activity tolerance;Decreased balance;Decreased mobility;Pain       PT Treatment Interventions DME instruction;Gait training;Stair training;Functional mobility training;Therapeutic activities;Therapeutic exercise;Balance training;Patient/family education    PT Goals (Current goals can be found in the Care Plan section)  Acute Rehab PT Goals Patient Stated Goal: return home PT Goal Formulation: With patient Time For Goal Achievement: 11/02/20 Potential to Achieve Goals: Good    Frequency Min 3X/week    AM-PAC PT "6 Clicks" Mobility  Outcome Measure Help needed turning from your back to your side while in a flat bed without using bedrails?: A Little Help needed moving from lying on your back to sitting on the side of a flat bed without using bedrails?: A Little Help needed moving to and from a bed to a chair (including a wheelchair)?: A Little Help needed standing up from a chair using your arms (e.g., wheelchair or bedside chair)?: A  Little Help needed to walk in hospital room?: A Little Help needed climbing 3-5 steps with a railing? : A Little 6 Click Score: 18    End of Session Equipment Utilized During Treatment: Gait belt Activity Tolerance: Patient tolerated treatment well;Patient limited by fatigue Patient left: in bed;with bed alarm set;with call bell/phone within reach (no chair in the pt's room) Nurse Communication: Mobility status (need for recliner) PT Visit Diagnosis: Unsteadiness on feet (R26.81);Other abnormalities of gait and mobility (R26.89);Muscle weakness (generalized) (M62.81)    Time: 2248-2500 PT Time Calculation (min) (ACUTE ONLY): 23 min   Charges:   PT Evaluation $PT Eval Low Complexity: 1 Low PT Treatments $Gait Training: 8-22 mins        Inocencio Homes, PT, DPT   Acute Rehabilitation Department Pager #: [phone]  Otho Bellows 10/19/2020, 2:00 PM

## 2020-10-19 NOTE — Consult Note (Signed)
Palliative Medicine Inpatient Consult Note  Reason for consult:  "GOC-multiple comorbidities including metastatic prostate cancer and bacteremia"  HPI:  Per intake H&P --> Andre Glass is a 70 y.o. male presenting with altered mental status and increased myoclonic jerking. PMH is significant for metastatic prostate cancer, symptomatic anemia, recurrent UTI,  hypertension, and recent admission for obstructive uropathy with bilateral hydronephrosis and placement of bilateral percutaneous nephrostomy tubes.  Patient was seen by PMT in October of 2021 for symptom management.   Clinical Assessment/Goals of Care:  *Please note that this is a verbal dictation therefore any spelling or grammatical errors are due to the "[place]" system interpretation.  I have reviewed medical records including EPIC notes, labs and imaging, received report from bedside RN, assessed the patient.    I met with Andre Glass and his brother, Andre Glass to further discuss diagnosis prognosis, GOC, EOL wishes, disposition and options.   I introduced Palliative Medicine as specialized medical care for people living with serious illness. It focuses on providing relief from the symptoms and stress of a serious illness. The goal is to improve quality of life for both the patient and the family.  A brief review of medical illnesses was had inclusive of Andre Glass's h/o prostate cancer with lymphadenopathy and disease to the bone diagnosed in December 2020. He receives treatment by Andre Glass for this. Discussed his anemia, constipation, HTN, multiple falls, and hydronephrosis. Reviewed present medical ailments inclusive of bacteremia.  Andre Glass shares that he is from [place]. He went to college at [employer] in [place], [place]. 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 He was married for one year though got divorced. He never had any children and shares that he is married to the church. 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 He enjoys traveling. He has a strong family who he considers to be a very important aspect of his life.   Prior to hospitalization Andre Glass had been living in a home which his nephew had moved into the aid in supporting [place]. He also has a caregiver who works with him daily for six hours. He needs assistance with medications, meals, and uses a rollator for mobility.   A detailed discussion was had today regarding advanced directives - Andre Glass has not completed these though he is interested in doing so while hospitalized. I provided he and his brother a copy of them for review and completion.    Concepts specific to code status, artifical feeding and hydration, continued IV antibiotics and rehospitalization was had.  I shared a MOST form and carefully detailed each section. At this moment, Andre Glass would like to be Full Code until he has the time to sit down with his whole family and have a formal conversation. Provided  "Hard Choices for Loving People" booklet to guide future conversations.   We discussed the importance of quality of the life we are living. Endorsed the importance of considering what is important to Andre Glass when we do enter the final phases of life. Discussed starting conversations earlier to better indicate what patients wishes would be in emergent or critical situations.    From a symptom perspective Andre Glass shares that he has battled constipation for many months now, in the past Linzess and Movantik have had favorable results. He does endorse two bowel movements in two days which were of decent size and consistency.  Andre Glass battles  chronic pain in the setting of his metastatic disease. He shares good symptom control at home with morphine Q8H with dilaudid in between.  Andre Glass reviewed that Andre Glass's tremors were likely in the setting of his gabapentin use which has now  been discontinued with improvement in symptoms.  Another area of concern per Andre Glass is that he has recently suffered from three falls. He believes this was due to blood pressure medications though he does have generalized weakness as well.   Discussed the importance of continued conversation with family and their  medical providers regarding overall plan of care and treatment options, ensuring decisions are within the context of the patients values and GOCs.  Decision Maker: Andre Glass (brother) [phone]  SUMMARY OF RECOMMENDATIONS   Full Code / Full Scope of Care - Patient would like time to gather his family together to discuss goals of care  Spiritual Advanced Care Planning - Provided Packet for family to further discuss  A MOST for was provided for review and completion  Will recommend OP Palliative Care  Ongoing Support for Andre Glass Planning  Code Status/Advance Care Planning: FULL CODE   Symptom Management:  Constipation:                 -Agree with  Movantick  - Agree with Linzess  - Continue Plecanatide  - Continue Senna 2 Tabs PO Daily  - Glycerine suppository PRN  Metastatic Pain:  - Continue Dilaudid 14m PO Q4H   - Was on Morphine 671mPO TID at home  (DCBoveyn 7/2, contributing to myoclonus?)  Falls: - Physical activity - Ensure wearing eyeglasses or contact lenses - Educate about the side effects medicines - Ensure adequate sleep - Stand up slowly - Use an assistive devices  - Wear non-skid, rubber-soled, low-heeled shoes, or lace-up shoes with non-skid soles that fully support feet.   Generalized Weakness:  - PT/OT   Advanced Care Planning/ Spiritual:                 - Chaplain consult   Palliative Prophylaxis:  Oral Care, Mobility  Additional Recommendations (Limitations, Scope, Preferences): Continue full scope of treatment   Psycho-social/Spiritual:  Desire for further Chaplaincy support: Yes Additional Recommendations: Education on advance  care planning   Prognosis: (+) Hypoalbuminemia (1.8g/dL), ECOG 2, (+) moderate frailty  Discharge Planning: Unclear presently.  Review of Systems  Gastrointestinal:  Positive for constipation. Negative for abdominal pain.  Genitourinary:  Positive for flank pain.  Musculoskeletal:  Positive for back pain, falls and joint pain.  Neurological:  Positive for tremors and weakness.   Oral Intake %:  Variable I/O:  L + R Nephrostomy tubes Bowel Movements:  Last this morning Mobility: Uses a Rollator/WC  Vitals:   10/18/20 2044 10/19/20 0600  BP: 114/61 118/65  Pulse: 80 68  Resp: 19 20  Temp: 98.8 F (37.1 C) 99.9 F (37.7 C)  SpO2: 98% 98%    Intake/Output Summary (Last 24 hours) at 10/19/2020 0943 Last data filed at 10/19/2020 0606 Gross per 24 hour  Intake 895 ml  Output 1400 ml  Net -505 ml   Last Weight  Most recent update: 10/18/2020 12:31 AM    Weight  84.7 kg (186 lb 11.7 oz)            Gen:  Frail AA M chronically ill in appearance HEENT: Dry mucous membranes CV: Regular rate and rhythm  PULM: On 2LPM ABD: soft/nontender  EXT: No edema Neuro: Alert and oriented  x3  PPS: 50%   This conversation/these recommendations were discussed with the FMT  Time In:  1000 Time Out: 1120 Total Time: 80 Greater than 50%  of this time was spent counseling and coordinating care related to the above assessment and plan.  [place] Team Team Cell Phone: [phone] Please utilize secure chat with additional questions, if there is no response within 30 minutes please call the above phone number  Palliative Medicine Team providers are available by phone from 7am to 7pm daily and can be reached through the team cell phone.  Should this patient require assistance outside of these hours, please call the patient's attending physician.

## 2020-10-19 NOTE — Progress Notes (Signed)
FPTS Brief Progress Note  S: Patient resting comfortably when I entered the room.  Reports that he is having some pain in his hip and would like more pain medicine.  He received Dilaudid 3 hours ago so should be able to get another dose in approximately 1 hour.  Has not had any other as needed medications.   O: BP 118/63 (BP Location: Right Arm)   Pulse 65   Temp 98.1 F (36.7 C) (Oral)   Resp 17   Ht 5\' 8"  (1.727 m)   Wt 84.7 kg   SpO2 99%   BMI 28.39 kg/m   General: Resting comfortably Respiratory: Speaking in clear sentences, no shortness of breath appreciated  A/P: 70 year old man presenting with AMS and increased myoclonic jerking. - AMS has resolved. - Patient does have a UTI with obstructive uropathy.  Patient has chronic pain and is getting Dilaudid every 4 hours.  LFTs are improving and liver ultrasound was negative.  We will try one-time dose of Tylenol until he can receive his next dose of Dilaudid.  - Orders reviewed. Labs for AM ordered, which was adjusted as needed.  - If condition changes, plan includes plan for reevaluation to determine need for further antibiotics/pain management.Gifford Shave, MD 10/19/2020, 11:26 PM PGY-3, [facility] Night Resident  Please page [phone] with questions.

## 2020-10-20 DIAGNOSIS — Z7189 Other specified counseling: Secondary | ICD-10-CM | POA: Diagnosis not present

## 2020-10-20 DIAGNOSIS — B961 Klebsiella pneumoniae [K. pneumoniae] as the cause of diseases classified elsewhere: Secondary | ICD-10-CM | POA: Diagnosis not present

## 2020-10-20 DIAGNOSIS — Z515 Encounter for palliative care: Secondary | ICD-10-CM | POA: Diagnosis not present

## 2020-10-20 DIAGNOSIS — R7881 Bacteremia: Secondary | ICD-10-CM | POA: Diagnosis not present

## 2020-10-20 DIAGNOSIS — D649 Anemia, unspecified: Secondary | ICD-10-CM | POA: Diagnosis not present

## 2020-10-20 DIAGNOSIS — N3000 Acute cystitis without hematuria: Secondary | ICD-10-CM | POA: Diagnosis not present

## 2020-10-20 LAB — CULTURE, BLOOD (ROUTINE X 2): Special Requests: ADEQUATE

## 2020-10-20 LAB — COMPREHENSIVE METABOLIC PANEL
ALT: 81 U/L — ABNORMAL HIGH (ref 0–44)
AST: 56 U/L — ABNORMAL HIGH (ref 15–41)
Albumin: 1.8 g/dL — ABNORMAL LOW (ref 3.5–5.0)
Alkaline Phosphatase: 121 U/L (ref 38–126)
Anion gap: 8 (ref 5–15)
BUN: 26 mg/dL — ABNORMAL HIGH (ref 8–23)
CO2: 25 mmol/L (ref 22–32)
Calcium: 8.6 mg/dL — ABNORMAL LOW (ref 8.9–10.3)
Chloride: 104 mmol/L (ref 98–111)
Creatinine, Ser: 1.43 mg/dL — ABNORMAL HIGH (ref 0.61–1.24)
GFR, Estimated: 53 mL/min — ABNORMAL LOW (ref >60)
Glucose, Bld: 104 mg/dL — ABNORMAL HIGH (ref 70–99)
Potassium: 3.3 mmol/L — ABNORMAL LOW (ref 3.5–5.1)
Sodium: 137 mmol/L (ref 135–145)
Total Bilirubin: 0.4 mg/dL (ref 0.3–1.2)
Total Protein: 6.8 g/dL (ref 6.5–8.1)

## 2020-10-20 LAB — CBC
HCT: 28.5 % — ABNORMAL LOW (ref 39.0–52.0)
Hemoglobin: 9.3 g/dL — ABNORMAL LOW (ref 13.0–17.0)
MCH: 28.9 pg (ref 26.0–34.0)
MCHC: 32.6 g/dL (ref 30.0–36.0)
MCV: 88.5 fL (ref 80.0–100.0)
Platelets: 373 10*3/uL (ref 150–400)
RBC: 3.22 MIL/uL — ABNORMAL LOW (ref 4.22–5.81)
RDW: 15.4 % (ref 11.5–15.5)
WBC: 17.8 10*3/uL — ABNORMAL HIGH (ref 4.0–10.5)
nRBC: 0 % (ref 0.0–0.2)

## 2020-10-20 MED ORDER — LIDOCAINE HCL URETHRAL/MUCOSAL 2 % EX GEL
1.0000 "application " | Freq: Two times a day (BID) | CUTANEOUS | Status: DC | PRN
  Administered 2020-10-20: 1 via TOPICAL
  Filled 2020-10-20 (×3): qty 6

## 2020-10-20 MED ORDER — PREGABALIN 25 MG PO CAPS
25.0000 mg | ORAL_CAPSULE | Freq: Two times a day (BID) | ORAL | Status: DC
  Administered 2020-10-20 – 2020-10-22 (×4): 25 mg via ORAL
  Filled 2020-10-20 (×4): qty 1

## 2020-10-20 MED ORDER — POTASSIUM CHLORIDE CRYS ER 20 MEQ PO TBCR
40.0000 meq | EXTENDED_RELEASE_TABLET | Freq: Two times a day (BID) | ORAL | Status: DC
  Administered 2020-10-20 – 2020-10-24 (×9): 40 meq via ORAL
  Filled 2020-10-20 (×9): qty 2

## 2020-10-20 MED ORDER — ENOXAPARIN SODIUM 40 MG/0.4ML IJ SOSY
40.0000 mg | PREFILLED_SYRINGE | INTRAMUSCULAR | Status: DC
  Administered 2020-10-20 – 2020-10-30 (×11): 40 mg via SUBCUTANEOUS
  Filled 2020-10-20 (×10): qty 0.4

## 2020-10-20 NOTE — Evaluation (Signed)
Occupational Therapy Evaluation Patient Details Name: Andre Glass MRN: [id number] DOB: 02-Jul-1950 Today's Date: 10/20/2020    History of Present Illness The pt is a 70 yo male presenting 7/14 with AMS and "jerking" movements. The pt was reccently admitted for obstructive uropathy with bilateral hydronephrosis and is now s/p placement of percutaneous nephrostomy tubes bilaterally. Work up this admission revealed UTI (klebsiella). PMH includes: metastatic adenocarinoma of prostate, chornic pain, HTN, and CKD.   Clinical Impression   Pt admitted with the above diagnoses and presents with below problem list. Pt will benefit from continued acute OT to address the below listed deficits and maximize independence with basic ADLs prior to d/c home. At baseline, pt uses a rollator for functional mobility, some assist with bathing. Pt currently needs up to min guard with functional transfers and mobility, min A with UB bathing.      Follow Up Recommendations  Home health OT;Supervision - Intermittent    Equipment Recommendations  None recommended by OT    Recommendations for Other Services       Precautions / Restrictions Precautions Precautions: Fall Precaution Comments: bilateral nephrostomy Restrictions Weight Bearing Restrictions: No      Mobility Bed Mobility Overal bed mobility: Modified Independent             General bed mobility comments: supervision for safety, no physical assist given from flat bed    Transfers Overall transfer level: Needs assistance Equipment used: None Transfers: Sit to/from Stand Sit to Stand: Min guard         General transfer comment: min guard for safety to/from EOB    Balance Overall balance assessment: Needs assistance Sitting-balance support: No upper extremity supported;Feet supported Sitting balance-Leahy Scale: Good     Standing balance support: Single extremity supported;During functional activity Standing balance-Leahy Scale:  Fair Standing balance comment: able to static stand without assist, at least single UE support                           ADL either performed or assessed with clinical judgement   ADL Overall ADL's : Needs assistance/impaired Eating/Feeding: Set up;Sitting   Grooming: Set up;Supervision/safety;Sitting;Standing   Upper Body Bathing: Set up;Sitting;Minimal assistance Upper Body Bathing Details (indicate cue type and reason): B nephrostomy lines Lower Body Bathing: Min guard;Sit to/from stand   Upper Body Dressing : Set up;Sitting   Lower Body Dressing: Min guard;Sit to/from stand   Toilet Transfer: Min guard;Ambulation;RW   Toileting- Water quality scientist and Hygiene: Min guard;Sit to/from stand       Functional mobility during ADLs: Min guard;Rolling walker General ADL Comments: Pt walked household distance today using rw. Pt currently needs up to min guard assist with LB dressing, functional transfers and mobility. Min A for UB bathing     Vision         Perception     Praxis      Pertinent Vitals/Pain Pain Assessment: Faces Faces Pain Scale: Hurts even more Pain Location: R hip, low back and RLE Pain Descriptors / Indicators: Discomfort;Sore Pain Intervention(s): Premedicated before session;Monitored during session;Limited activity within patient's tolerance;Repositioned     Hand Dominance Right   Extremity/Trunk Assessment Upper Extremity Assessment Upper Extremity Assessment: Generalized weakness   Lower Extremity Assessment Lower Extremity Assessment: Defer to PT evaluation   Cervical / Trunk Assessment Cervical / Trunk Assessment: Normal   Communication Communication Communication: No difficulties   Cognition Arousal/Alertness: Awake/alert Behavior During Therapy: WFL for tasks  assessed/performed Overall Cognitive Status: Within Functional Limits for tasks assessed                                 General Comments: pt  A&Ox4, does require increased time to answer open ended questions at times   General Comments  h/o of one recent fall at home. Pt reports he became dizzy after starting a new bloood pressure medication and fell while trying to get out of bed.    Exercises     Shoulder Instructions      Home Living Family/patient expects to be discharged to:: Private residence Living Arrangements: Other relatives Available Help at Discharge: Family;Personal care attendant (care attendant 6 hours during weekday) Type of Home: House Home Access: Stairs to enter [employer] of Steps: 2 Entrance Stairs-Rails: None Home Layout: One level     Bathroom Shower/Tub: Occupational psychologist: Handicapped height     Home Equipment: Environmental consultant - 4 wheels;Walker - 2 wheels;Cane - quad;Shower seat;Grab bars - tub/shower;Bedside commode;Wheelchair - manual;Hospital bed          Prior Functioning/Environment Level of Independence: Needs assistance  Gait / Transfers Assistance Needed: reports ambulation with rolaltor within the house and for community distances, [facility] for doctors apt ADL's / Homemaking Assistance Needed: care attendant assists with meals, showering            OT Problem List: Decreased strength;Decreased activity tolerance;Impaired balance (sitting and/or standing);Decreased knowledge of use of DME or AE;Decreased knowledge of precautions;Pain      OT Treatment/Interventions: Self-care/ADL training;Energy conservation;DME and/or AE instruction;Therapeutic activities;Patient/family education;Balance training    OT Goals(Current goals can be found in the care plan section) Acute Rehab OT Goals Patient Stated Goal: return home OT Goal Formulation: With patient Time For Goal Achievement: 11/03/20 Potential to Achieve Goals: Good ADL Goals Pt Will Perform Grooming: with modified independence;standing;sitting Pt Will Perform Lower Body Bathing: with modified independence;sit  to/from stand Pt Will Transfer to Toilet: with modified independence;ambulating Pt Will Perform Toileting - Clothing Manipulation and hygiene: with modified independence;sit to/from stand  OT Frequency: Min 2X/week   Barriers to D/C:            Co-evaluation              AM-PAC OT "6 Clicks" Daily Activity     Outcome Measure Help from another person eating meals?: None Help from another person taking care of personal grooming?: None Help from another person toileting, which includes using toliet, bedpan, or urinal?: A Little Help from another person bathing (including washing, rinsing, drying)?: A Little Help from another person to put on and taking off regular upper body clothing?: None Help from another person to put on and taking off regular lower body clothing?: A Little 6 Click Score: 21   End of Session Equipment Utilized During Treatment: Rolling walker  Activity Tolerance: Patient tolerated treatment well;Patient limited by fatigue;Patient limited by pain Patient left: in bed;with call bell/phone within reach  OT Visit Diagnosis: Unsteadiness on feet (R26.81);Muscle weakness (generalized) (M62.81);Pain;History of falling (Z91.81) Pain - Right/Left: Right Pain - part of body: Hip;Leg                Time: 1610-9604 OT Time Calculation (min): 32 min Charges:  OT General Charges $OT Visit: 1 Visit OT Evaluation $OT Eval Low Complexity: 1 Low OT Treatments $Self Care/Home Management : 8-22 mins  Tyrone Schimke, OT  Acute Rehabilitation Services Pager: [phone] Office: [place], [place] 10/20/2020, 2:42 PM

## 2020-10-20 NOTE — Progress Notes (Signed)
Lovenox 40mg  SQ qday for DVT prophylaxis. CrCl>30 ml/min   Onnie Boer, PharmD, BCIDP, AAHIVP, CPP Infectious Disease Pharmacist 10/20/2020 11:28 AM

## 2020-10-20 NOTE — Progress Notes (Signed)
Palliative Medicine Inpatient Follow Up Note  Reason for consult:  "GOC-multiple comorbidities including metastatic prostate cancer and bacteremia"   HPI:  Per intake H&P --> Andre Glass is a 70 y.o. male presenting with altered mental status and increased myoclonic jerking. PMH is significant for metastatic prostate cancer, symptomatic anemia, recurrent UTI,  hypertension, and recent admission for obstructive uropathy with bilateral hydronephrosis and placement of bilateral percutaneous nephrostomy tubes.   Patient was seen by PMT in October of 2021 for symptom management.   Today's Discussion (10/20/2020):  *Please note that this is a verbal dictation therefore any spelling or grammatical errors are due to the "[place] One" system interpretation.  Chart reviewed. Oval has required 13m of dilaudid in the past 24/hrs = 157moral morphine equivalents. He has had multiple bowel movements.   I met with ErThainehis morning. He shares with me that he has had some more pain overnight and states the primary medical team plan to increase his pain medications today. He reviewed that he was stopped on his oral morphine as his team said they would try something else. He expresses increased burning in his feet - we reviewed that this is likely in the setting of no longer having his gabapentin for his neuropathic pain. While in the room the family medicine resident was able to come in to complete an assessment and listen to these concerns.  I was able to have a conversation with [name] the importance of his family. He shares that his parents had four sons and four daughters. Two of his sisters have passed away, one of cancer and on of covid. He reviewed that his younger brother also has prostate cancer though to a lesser degree which helped [name] a diagnosis. He laments about his life and that he always wants to have children. He shares with me that his nephew, [name] his wife live with him  with their se55ear old, Memory and fo32ld, [name]. He expresses the joy he gets from spending time with them. We reviewed some of his memories in terms of his presence in their lives. Richard shares that his goal by the end of the month is to get together with all of his family to continue having the important conversations which were started yesterday. He has completed his advance directives, this was difficult for he and his brother but he is proud of their ability to get through the process.  From a daily goal perspective, [name] to mobilize more today.We reviewed the importance on continued mobility.   Questions and concerns addressed   Objective Assessment: Vital Signs Vitals:   10/20/20 0520 10/20/20 1026  BP: 124/70 121/60  Pulse: 67 63  Resp: 18 18  Temp: 98 F (36.7 C) 97.9 F (36.6 C)  SpO2: 97% 99%    Intake/Output Summary (Last 24 hours) at 10/20/2020 1134 Last data filed at 10/20/2020 0600 Gross per 24 hour  Intake 706.17 ml  Output 1675 ml  Net -968.83 ml   Last Weight  Most recent update: 10/20/2020 12:06 AM    Weight  84.7 kg (186 lb 11.7 oz)            Gen:  Frail AA M chronically ill in appearance HEENT: Dry mucous membranes CV: Regular rate and rhythm PULM: On 2LPM ABD: soft/nontender EXT: No edema Neuro: Alert and oriented x3   SUMMARY OF RECOMMENDATIONS   Full Code / Full Scope of Care - Patient  would like time to gather his family together to discuss goals of care   [place] - Provided Packet for family to further discuss   A MOST for was provided for review and completion   Will recommend OP Palliative Care   Ongoing Support for [place] Planning   Code Status/Advance Care Planning: FULL CODE   Symptom Management:  Constipation:                 -Agree with  Movantick                 - Agree with Linzess                 - Continue Plecanatide                 - Continue Senna 2 Tabs PO Daily                  - Glycerine suppository PRN   Metastatic Pain:                 - Continue Dilaudid 64m PO Q4H                 - Was on Morphine 675mPO TID at home  (DCDickeyn 7/2)  - Would recommend considering another long acting such as Oxycontin 2072mith oxocodone 5-38m4m Q4H PRN for break through pain and to titrate up as needed  Neuropathic Pain:  - Gabapentin stopped d/t myoclonus  - Consider cymbalta or lyrica   Falls: - Physical activity - Ensure wearing eyeglasses or contact lenses - Educate about the side effects medicines - Ensure adequate sleep - Stand up slowly - Use an assistive devices - Wear non-skid, rubber-soled, low-heeled shoes, or lace-up shoes with non-skid soles that fully support feet.    Generalized Weakness:                 - PT/OT  - Mobilize daily   Advanced Care Planning/ Spiritual:                 - Chaplain consult   Time Spent: 50 Greater than 50% of the time was spent in counseling and coordination of care ______________________________________________________________________________________ [name] Team Cell Phone: [phone] utilize secure chat with additional questions, if there is no response within 30 minutes please call the above phone number  Palliative Medicine Team providers are available by phone from 7am to 7pm daily and can be reached through the team cell phone.  Should this patient require assistance outside of these hours, please call the patient's attending physician.

## 2020-10-20 NOTE — Progress Notes (Signed)
Spoke with neurology regarding patient's neuropathic pain now that he is no longer taking gabapentin.  Neurology recommended a trial of low-dose of Lyrica.  I consulted pharmacy for appropriate dosing.  We will start him on 50 mg Lyrica twice daily.  [name], DO

## 2020-10-20 NOTE — Plan of Care (Signed)
  Problem: Education: Goal: Knowledge of General Education information will improve Description: Including pain rating scale, medication(s)/side effects and non-pharmacologic comfort measures Outcome: Completed/Met   Problem: Clinical Measurements: Goal: Ability to maintain clinical measurements within normal limits will improve Outcome: Completed/Met

## 2020-10-20 NOTE — Progress Notes (Signed)
FPTS Brief Progress Note  S: Saw patient at bedside this evening. He reports left sided chest pain which started one hour when he was laying in bed. 4/10 severity. Unable to describe the character of the pain. Pleuritic in nature. No radiation. Worsened by movement and tender on palpation of anterior chest. No hx of MI, stent or CABG. I explained we will do an EKG and troponins and for the pain he can have dilaudid and also topical lidocaine jelly. He reports improvement in pain with other bodily pains with dilaudid.    O: BP 132/74 (BP Location: Right Arm)   Pulse 61   Temp 98.2 F (36.8 C) (Oral)   Resp 18   Ht 5\' 8"  (1.727 m)   Wt 84.7 kg   SpO2 95%   BMI 28.39 kg/m    General: Alert, no acute distress, laying in bed  Cardio: Normal S1 and S2, RRR, no r/m/g Pulm: CTAB, normal work of breathing Abdomen: bilateral nephrotomies draining clear fluid  Extremities: No peripheral edema.  MSK: TTP anterior/left sided chest  Neuro: Cranial nerves grossly intact    A/P: Chest pain likely musculoskeletal  -EKG stat -Troponins x 2 -Pain control: Dilaudid 2mg  Q4PRN, lidocaine 2% jelly PRN -Orders reviewed. Labs for AM ordered, which was adjusted as needed.  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

## 2020-10-20 NOTE — Progress Notes (Addendum)
Family Medicine Teaching Service Daily Progress Note Intern Pager: [phone]  Patient name: Andre Glass Medical record number: [id number] Date of birth: February 22, 1951 Age: 70 y.o. Gender: male  Primary Care Provider: [name], DO Consultants: Neurology, palliative Code Status: Code  Pt Overview and Major Events to Date:  7/15-admitted  Assessment and Plan: Patient is a 70 year old male presenting with AMS and increased myoclonic jerking.  Possible history significant for metastatic prostate cancer, symptomatic anemia, recurrent UTI, hypertension, and recent admission for obstructive uropathy requiring bilateral percutaneous nephrostomy tubes  Myoclonus AMS has resolved.  Patient is alert and oriented this morning.  EEG on 7/15 showed periodic discharges and triphasic morphology at 1 Hz, and moderate diffuse encephalopathy which neurology states could be due to toxic metabolic causes.  No myoclonus on exam. -Neurology following -Neurology recommends to continue to hold gabapentin at discharge as this is likely the cause of his myoclonus  UTI  obstructive uropathy  concern for invasion of prostate cancer into bladder Ceftriaxone was increased to 2g yesterday every 24 hours as blood culture showed Klebsiella.  Renal ultrasound from 7/15 revealed possible bladder neoplasm or hematoma, mild right hydronephrosis. Findings were similar to CT on 6/25. -Monitor for fevers -Ceftriaxone -Consult palliative -Continue to limit opiates per neuro recommendations  Metastatic adenocarcinoma of prostate  chronic cancer pain Patient states he is low back pain and has pain in his right lower leg.  He also states he has sharp shooting neuropathic pain in both feet however, neurology advised against gabapentin as it is likely the cause of his myoclonus.  I will reach out to neurology to ask if Lyrica would be an option to help with his neuropathic pain. -palliative consulted -Hydromorphone 4 mg PO  q4h -lidocaine gel  Klebsiella bacteremia Afebrile over the last 24 hours, WBC down from 21.2>17.8.  -Continue ceftriaxone 2g IV today and tomorrow -Monitor for fevers  Chronic anemia Base line hemoglobin seems to be 7.5-8.5.  On admission hemoglobin was 5.4, patient was given 2 units packed RBCs.  There is suspicion that this 5.4 number was inaccurate as follow-up hemoglobin after transfusion was 9.5.  Today hemoglobin 9.3.  Lovenox can now be used for DVT prophylaxis. -Daily CBC   Transaminitis AST down from 66>56 and ALT down from 91>81.  Hepatitis panel negative and liver ultrasound showed no pathology.  Etiology considered to be from either medications or inflammation due to bacteremia and malignancy -Daily CMP -Continue to monitor  Hypokalemia Potassium is still low at 3.3.  It was 3.2 yesterday and patient was given 40 mEq oral potassium.  -Oral Potassium 40 mEq BID today -AM CMP  HTN BP this morning is 124/710 -continue Amlodipine  BPH -Continue flomax  Chronic constipation secondary to chronic opioid use -Continue home Plaquenil tied -Continue MiraLAX -Continue Movantik -Continue senna  FEN/GI: Regular PPx: Lovenox Dispo:Home in 2-3 days. Barriers include Continued medical workup.   Subjective:  Patient is in good spirits, states he is feeling much better, altered mental status has resolved.  He states he is having pain in his lower back and right leg, and significant neuropathy in both feet.  Objective: Temp:  [97.9 F (36.6 C)-98.1 F (36.7 C)] 98 F (36.7 C) (07/17 0520) Pulse Rate:  [65-68] 67 (07/17 0520) Resp:  [17-18] 18 (07/17 0520) BP: (116-124)/(59-70) 124/70 (07/17 0520) SpO2:  [95 %-99 %] 97 % (07/17 0520) Weight:  [84.7 kg] 84.7 kg (07/16 2059) Physical Exam: General:  Cardiovascular: RRR, no murmurs, normal S1/S2 Respiratory:  CTA bilaterally Abdomen: Normal bowel sounds, minor discomfort on palpation Extremities: No  edema  Laboratory: Recent Labs  Lab 10/18/20 0455 10/18/20 1820 10/19/20 0433 10/20/20 0346  WBC 16.9*  --  21.2* 17.8*  HGB 8.3* 9.5* 9.1* 9.3*  HCT 26.0* 29.7* 27.9* 28.5*  PLT 330  --  364 373   Recent Labs  Lab 10/18/20 0455 10/19/20 0433 10/20/20 0346  NA 136 135 137  K 3.7 3.2* 3.3*  CL 103 102 104  CO2 25 26 25   BUN 28* 29* 26*  CREATININE 1.47* 1.51* 1.43*  CALCIUM 8.2* 8.3* 8.6*  PROT 6.5 6.8 6.8  BILITOT 0.8 0.7 0.4  ALKPHOS 140* 128* 121  ALT 128* 91* 81*  AST 197* 66* 56*  GLUCOSE 136* 131* 104[name], DO 10/20/2020, 8:03 AM PGY-1, [place] Intern pager: [phone], text pages welcome

## 2020-10-21 ENCOUNTER — Ambulatory Visit: Payer: Medicare Other | Admitting: Podiatry

## 2020-10-21 ENCOUNTER — Other Ambulatory Visit (HOSPITAL_COMMUNITY): Payer: Self-pay

## 2020-10-21 DIAGNOSIS — E43 Unspecified severe protein-calorie malnutrition: Secondary | ICD-10-CM | POA: Diagnosis not present

## 2020-10-21 DIAGNOSIS — B961 Klebsiella pneumoniae [K. pneumoniae] as the cause of diseases classified elsewhere: Secondary | ICD-10-CM | POA: Diagnosis not present

## 2020-10-21 DIAGNOSIS — R7881 Bacteremia: Secondary | ICD-10-CM | POA: Diagnosis not present

## 2020-10-21 DIAGNOSIS — R52 Pain, unspecified: Secondary | ICD-10-CM | POA: Diagnosis not present

## 2020-10-21 DIAGNOSIS — Z515 Encounter for palliative care: Secondary | ICD-10-CM | POA: Diagnosis not present

## 2020-10-21 DIAGNOSIS — C61 Malignant neoplasm of prostate: Secondary | ICD-10-CM

## 2020-10-21 DIAGNOSIS — D649 Anemia, unspecified: Secondary | ICD-10-CM | POA: Diagnosis not present

## 2020-10-21 LAB — TYPE AND SCREEN
ABO/RH(D): A POS
Antibody Screen: NEGATIVE
Unit division: 0
Unit division: 0

## 2020-10-21 LAB — BPAM RBC
Blood Product Expiration Date: 202208062359
Blood Product Expiration Date: 202208062359
ISSUE DATE / TIME: 202207142227
Unit Type and Rh: 6200
Unit Type and Rh: 6200

## 2020-10-21 LAB — COMPREHENSIVE METABOLIC PANEL
ALT: 82 U/L — ABNORMAL HIGH (ref 0–44)
AST: 58 U/L — ABNORMAL HIGH (ref 15–41)
Albumin: 1.7 g/dL — ABNORMAL LOW (ref 3.5–5.0)
Alkaline Phosphatase: 120 U/L (ref 38–126)
Anion gap: 7 (ref 5–15)
BUN: 20 mg/dL (ref 8–23)
CO2: 25 mmol/L (ref 22–32)
Calcium: 8.5 mg/dL — ABNORMAL LOW (ref 8.9–10.3)
Chloride: 105 mmol/L (ref 98–111)
Creatinine, Ser: 1.2 mg/dL (ref 0.61–1.24)
GFR, Estimated: >60 mL/min (ref >60)
Glucose, Bld: 97 mg/dL (ref 70–99)
Potassium: 3.9 mmol/L (ref 3.5–5.1)
Sodium: 137 mmol/L (ref 135–145)
Total Bilirubin: 0.5 mg/dL (ref 0.3–1.2)
Total Protein: 6.6 g/dL (ref 6.5–8.1)

## 2020-10-21 LAB — CBC
HCT: 29.5 % — ABNORMAL LOW (ref 39.0–52.0)
Hemoglobin: 9.4 g/dL — ABNORMAL LOW (ref 13.0–17.0)
MCH: 28.7 pg (ref 26.0–34.0)
MCHC: 31.9 g/dL (ref 30.0–36.0)
MCV: 90.2 fL (ref 80.0–100.0)
Platelets: 421 10*3/uL — ABNORMAL HIGH (ref 150–400)
RBC: 3.27 MIL/uL — ABNORMAL LOW (ref 4.22–5.81)
RDW: 15.4 % (ref 11.5–15.5)
WBC: 12.9 10*3/uL — ABNORMAL HIGH (ref 4.0–10.5)
nRBC: 0 % (ref 0.0–0.2)

## 2020-10-21 LAB — TROPONIN I (HIGH SENSITIVITY)
Troponin I (High Sensitivity): 18 ng/L — ABNORMAL HIGH (ref <18)
Troponin I (High Sensitivity): 19 ng/L — ABNORMAL HIGH (ref <18)

## 2020-10-21 MED ORDER — ENSURE ENLIVE PO LIQD
237.0000 mL | Freq: Three times a day (TID) | ORAL | Status: DC
  Administered 2020-10-25 – 2020-10-30 (×11): 237 mL via ORAL

## 2020-10-21 MED ORDER — PLECANATIDE 3 MG PO TABS
3.0000 mg | ORAL_TABLET | Freq: Every day | ORAL | Status: DC
  Administered 2020-10-21: 3 mg via ORAL
  Filled 2020-10-21 (×2): qty 3

## 2020-10-21 MED ORDER — CEPHALEXIN 500 MG PO CAPS
500.0000 mg | ORAL_CAPSULE | Freq: Four times a day (QID) | ORAL | Status: DC
  Administered 2020-10-22 – 2020-10-30 (×34): 500 mg via ORAL
  Filled 2020-10-21 (×34): qty 1

## 2020-10-21 MED ORDER — HYDROMORPHONE HCL 2 MG PO TABS
6.0000 mg | ORAL_TABLET | ORAL | Status: DC | PRN
  Administered 2020-10-21: 8 mg via ORAL
  Administered 2020-10-22 – 2020-10-23 (×5): 6 mg via ORAL
  Administered 2020-10-24: 8 mg via ORAL
  Administered 2020-10-24: 6 mg via ORAL
  Administered 2020-10-24: 8 mg via ORAL
  Administered 2020-10-25 – 2020-10-27 (×5): 6 mg via ORAL
  Filled 2020-10-21: qty 4
  Filled 2020-10-21: qty 3
  Filled 2020-10-21: qty 4
  Filled 2020-10-21: qty 3
  Filled 2020-10-21: qty 4
  Filled 2020-10-21 (×10): qty 3

## 2020-10-21 MED ORDER — HYDROMORPHONE HCL 2 MG PO TABS
4.0000 mg | ORAL_TABLET | ORAL | Status: DC | PRN
Start: 2020-10-21 — End: 2020-10-21
  Administered 2020-10-21: 4 mg via ORAL
  Filled 2020-10-21: qty 2

## 2020-10-21 MED ORDER — ADULT MULTIVITAMIN W/MINERALS CH
1.0000 | ORAL_TABLET | Freq: Every day | ORAL | Status: DC
  Administered 2020-10-21 – 2020-10-30 (×10): 1 via ORAL
  Filled 2020-10-21 (×10): qty 1

## 2020-10-21 NOTE — Plan of Care (Signed)
  Problem: Health Behavior/Discharge Planning: Goal: Ability to manage health-related needs will improve Outcome: Completed/Met   Problem: Clinical Measurements: Goal: Diagnostic test results will improve Outcome: Completed/Met Goal: Respiratory complications will improve Outcome: Completed/Met Goal: Cardiovascular complication will be avoided Outcome: Completed/Met   Problem: Activity: Goal: Risk for activity intolerance will decrease Outcome: Completed/Met   Problem: Nutrition: Goal: Adequate nutrition will be maintained Outcome: Completed/Met

## 2020-10-21 NOTE — Progress Notes (Signed)
This chaplain is responded to PMT consult for notarizing the Pt. Advance Directive: HCPOA and Living Will. The chaplain understands the Pt. completed AD education and filled out the document. The incomplete AD is in the Pt. chart.   The Pt. is struggling with multiple bowel movements today. The Pt. describes himself as exhausted and embarrassed. The chaplain updated the Pt. RN-Allyson.  The chaplain informed the Pt. how to notarize the AD outside the hospital if the Pt. is discharged before a notary is available.   This chaplain is available for F/U spiritual care as needed.

## 2020-10-21 NOTE — Hospital Course (Addendum)
Andre Glass is a 70 year old male presenting with altered mental status and increased myoclonic jerking.  Past medical history significant for metastatic prostate cancer, symptomatic anemia, recurrent UTI, hypertension, recent admission for obstructive uropathy with bilateral hydronephrosis and placement of bilateral percutaneous nephrostomy tubes.  Altered mental status  myoclonus Patient was brought to the ED on 7/15 when his care giver noted he had increased monoclonal jerking and significant altered mental status.  On admission he was febrile to 102.9 with a white count of 14.1. CT and MRI showed no acute intracranial abnormality, but did demonstrate mild age-related cerebral atrophy with chronic microvascular ischemic disease. He did have meningeal signs on exam and we were initially concerned for CNS infection, but after consult with neurology, deferred lumbar puncture on admission. Neurology suggested the cause of his myoclonus could be his extensive use of opiates and also gabapentin for pain control as he is a chronic cancer patient.  They suggested to limit his opiate use, and discontinued gabapentin.  An EEG showed evidence of moderate diffuse encephalopathy, nonspecific etiology. His symptoms ultimately improved within four days of transitioning from gabapentin to pregabalin and reducing daily MME while still achieving pain control.   UTI  bacteremia On arrival, [name] was febrile to 102.9, with UA findings consistent with UTI. He also had an elevated Creatinine to 1.66, up from his baseline of 1.22. Urine obtained from his nephrostomy tubes grew up multiple species and he was subsequently found to have Klebsiella bacteremia. He received IV ceftriaxone at 1g daily for one day, which was escalated to 2g daily once his blood culture was positive. Once sensitivities returned, he transitioned to oral Keflex. He was discharged on oral cefadroxil to complete a total of 14 days of antibiotics.  Patient was afebrile for >48 hours at the time of discharge.   Diarrhea suspicious for encopresis Abdominal Pain Beginning on hospital day four, [name] had several days of nonbloody watery diarrhea with an increased WBC count. We were initially concerned for infectious process vs. opiate withdrawal given that we had decreased his home dose of opioids as discussed above. He also had increased abdominal pain. He always has abdominal pain at baseline, but felt that this was "different and worse." We obtained an abdominal ultrasound which did not reveal an identifiable cause of his pain. GI studies were negative for C. Difficile and no other infectious cause was detected on GI panel. He has a history of chronic constipation related to his chronic opioid use and had moderate stool burden on CT Abd Pelv three days after onset of diarrhea. Given this, we were concered for encopresis. He received two SMOG enemas with passage of hard stool and resolution of his diarrhea and notable improvement in his abdominal pain.     Items for PCP follow-up: 1) Renal US showed thickened bladder with masslike echogenicity concerning for local invasion of prostate CA into bladder. He will need to follow-up with his urologist for this.  2) Sending out on cefadroxil for two days for antibiotics, his last day of treatment should be 7/29 3) We reduced his morphine milligram equivalent opioid dose during inpatient stay, recommend continuing to evaluate if pain control is appropriate 4) Discontinued his MS Contin during hospitalization  5) Discharged on Remeron per patient request to help appetite 6) We discontinued gabapentin in favor of lyrica (patient had myoclonic jerking which resolved after discontinuing gabapentin)

## 2020-10-21 NOTE — Progress Notes (Signed)
Had paged Family Medicine Team regarding patient having 5+ loose stools, the most resent yellow colored.  Team to discuss next action.

## 2020-10-21 NOTE — Progress Notes (Addendum)
Family Medicine Teaching Service Daily Progress Note Intern Pager: [phone]  Patient name: Andre Glass Medical record number: [id number] Date of birth: [birth year] Age: 70 y.o. Gender: male  Primary Care Provider: [name], DO Consultants: Palliative, neurology Code Status: DNR  Pt Overview and Major Events to Date:  7/15-admitted  Assessment and Plan: Patient is a 70 year old male presenting with altered mental status and increased myoclonic jerking. This has resolved.  Past medical history significant for metastatic prostate cancer, symptomatic anemia, recurrent UTI, and hypertension, and recent admission for obstructive uropathy requiring bilateral nephrostomy tubes  Metastatic adenocarcinoma of the prostate, chronic cancer patient Patient states his back and leg pain are helped by the hydromorphone.  As it wears off his pain comes back but goes away when he takes it again.  He feels he has adequate pain control for his leg and back.  His neuropathic pain is still occurring in his feet. -Continue hydromorphone 6-8 mg every 4 hours as needed -Increase lyrica to 50 mg BID  Diarrhea Pt had watery diarrhea yesterday. Denies any blood.  This is continuing today.  Patient states it is no better.  He is also having diffuse abdominal pain that comes and goes. C. Diff screen is negative. -Discontinue laxatives  UTI  bacteremia obstructive uropathy with bilateral nephrostomy tubes 7/18 was last day of IV ceftriaxone 2 g for Klebsiella bacteremia.  Patient will transition today to oral Keflex 500 mg q6h. WBC down up from 12.9>14.7.  Patient remains afebrile. Pt states right nephrostomy tube starting leaking last night. On exam this morning, gauze around tube was damp.  -Start oral Keflex 500 mg q6h today -am CBC -Consult IR about leakage from nephrostomy tube  Myoclonus and AMS Both symptoms have resolved.  Continuing to refrain from gabapentin.  This has been replaced by low-dose  Lyrica for neuropathic pain. -Neurology following  Transaminitis Suspected to be caused by inflammation due to bacteria or malignancy.  AST WNL today, ALT down from 82>63.  Anemia Hemoglobin stable at 10.1 -Daily CBC  Hypertension Blood pressure is today 145/72 -Continue amlodipine  BPH -Continue Flomax  FEN/GI: Regular PPx: Lovenox Dispo:Home tomorrow. Barriers include cessation of diarrhea.   Subjective:  Pt was on bedside commode, had just had more diarrhea.  Pt stated watery diarrhea had not gotten any better since yesterday.  States his leg and back pain is help by the hydromorphone.  He is still having shooting neuropathic pain in both feet. He would like to stay today as he is still having the diarrhea.   Objective: Temp:  [97.9 F (36.6 C)-98.4 F (36.9 C)] 97.9 F (36.6 C) (07/18 0850) Pulse Rate:  [61-68] 66 (07/18 0850) Resp:  [17-18] 17 (07/18 0850) BP: (120-133)/(66-74) 133/66 (07/18 0850) SpO2:  [95 %-100 %] 100 % (07/18 0850) Physical Exam: General: Pt sitting on bedside commode, had just had more diarrhea. NAD Cardiovascular:  Respiratory: CTA bilaterally Abdomen: diffuse tenderness to palpation, non distended, soft Extremities: no edema  Laboratory: Recent Labs  Lab 10/19/20 0433 10/20/20 0346 10/21/20 0344  WBC 21.2* 17.8* 12.9*  HGB 9.1* 9.3* 9.4*  HCT 27.9* 28.5* 29.5*  PLT 364 373 421*   Recent Labs  Lab 10/19/20 0433 10/20/20 0346 10/21/20 0344  NA 135 137 137  K 3.2* 3.3* 3.9  CL 102 104 105  CO2 26 25 25   BUN 29* 26* 20  CREATININE 1.51* 1.43* 1.20  CALCIUM 8.3* 8.6* 8.5*  PROT 6.8 6.8 6.6  BILITOT 0.7 0.4 0.5  ALKPHOS 128* 121 120  ALT 91* 81* 82*  AST 66* 56* 58*  GLUCOSE 131* 104* 97     [name], DO 10/21/2020, 3:14 PM PGY-1, [place] Intern pager: [phone], text pages welcome

## 2020-10-21 NOTE — TOC Benefit Eligibility Note (Signed)
Patient Teacher, English as a foreign language completed.    The patient is currently admitted and upon discharge could be taking Oxycodone (Oxycontin) ER 20 mg.  Requires Prior Authorization  The patient is insured through [place] Patient Advocate Specialist [place] Team Direct Number: [phone]  Fax: [phone]

## 2020-10-21 NOTE — Progress Notes (Signed)
Daily Progress Note   Patient Name: Andre Glass       Date: 10/21/2020 DOB: [birth year]  Age: 70 y.o. MRN#: [id number] Attending Physician: Lenoria Chime, MD Primary Care Physician: [name], DO Admit Date: 10/17/2020  Reason for Consultation/Follow-up: symptom management follow up   Subjective: Glyn tells me he is feeling very poorly mostly due to diarrhea.  Unfortunately he received Movantik though he was already stooling.  He used 12 mg of IV dilaudid on 7/17.   Lost his IV and was placed on oral dilaudid today.  Is on Lyrica for neuropathic pain.   Assessment: Fatigued and in pain.   Patient Profile/HPI:Per intake H&P --> Andre Glass is a 70 y.o. male presenting with altered mental status and increased myoclonic jerking. PMH is significant for metastatic prostate cancer, symptomatic anemia, recurrent UTI,  hypertension, and recent admission for obstructive uropathy with bilateral hydronephrosis and placement of bilateral percutaneous nephrostomy tubes.   Patient was seen by PMT in October of 2021 for symptom management.     Length of Stay: 4   Vital Signs: BP 128/64 (BP Location: Right Arm)   Pulse 68   Temp 98.2 F (36.8 C) (Oral)   Resp 17   Ht 5\' 8"  (1.727 m)   Wt 84.7 kg   SpO2 100%   BMI 28.39 kg/m  SpO2: SpO2: 100 % O2 Device: O2 Device: Room Air O2 Flow Rate:         Palliative Assessment/Data:     Palliative Care Plan    Recommendations/Plan: Would hold miralax, senna, etc when giving movantik.  And hold movantik if he is stooling Increased oral dilaudid to 6 - 8 mg based on the severity of his pain.  Code Status:  Full code  Prognosis:  Unable to determine     Care plan was discussed with patient and RN  Thank you for allowing the  Palliative Medicine Team to assist in the care of this patient.  Total time spent:  25 min     Greater than 50%  of this time was spent counseling and coordinating care related to the above assessment and plan.  Florentina Jenny, PA-C Palliative Medicine  Please contact Palliative MedicineTeam phone at [phone] for questions and concerns between 7 am - 7 pm.   Please see  [facility] for individual provider pager numbers.

## 2020-10-21 NOTE — Progress Notes (Signed)
PT Cancellation Note  Patient Details Name: Andre Glass MRN: [id number] DOB: 07-Jun-1950   Cancelled Treatment:    Reason Eval/Treat Not Completed: Other (comment)  Pt is exhausted after multiple transfers to/from [facility];   Asked to return later;   Will follow up later today as time allows;  Otherwise, will follow up for PT tomorrow;   Thank you,  Roney Marion, PT  Acute Rehabilitation Services Pager [phone] Office [phone]    [place] 10/21/2020, 12:37 PM

## 2020-10-21 NOTE — Progress Notes (Signed)
Family Medicine Teaching Service Daily Progress Note Intern Pager: [phone]  Patient name: Andre Glass Medical record number: [id number] Date of birth: December 22, 1950 Age: 70 y.o. Gender: male  Primary Care Provider: [name], DO Consultants: Palliative Code Status: DNR  Pt Overview and Major Events to Date:  7/15-Admitted  Assessment and Plan: Pt is a 70 y.o. male presented with AMS and increased myoclonic jerking. Both have resolved. PMHx significant for metastatic prostate cancer, symptomatic anemia, recurrent UIT, HTN, and recent admission for obstructive uropathy requiring bilateral nephrostomy tubes  Myoclonus and AMS Myoclonus and AMS have resolved. Neurology took pt off of gabapentin and recommended low amounts of opioids. EEG from 7/15 showed moderate diffuse encephalopathy which neurology states could be due to toxic metabolic causes.   -Neurology following -holding gabapentin -Low dose of lyrica in place of gabapentin  UTI  Bacteremia  Today is last day of IV ceftriaxone 2g for klebsiella bacteremia.  Urine cultures grew multiple organisms. Switch to PO abx today. WBC down from 17.8>12.9. Remains afebrile -Last day of IV ceftriaxone -Transition to oral abx -Palliative consulted -CBC a.m.  Metastatic adenocarcinoma of prostate  chronic cancer pain Pt states his back and leg pain are at a 7/10 today.  His neuropathic pain in his feet is currently gone. -PO hydromorphone 4mg  q4h -Continue lyrica  Chest pain Pt reported left sided chest pain last night which was worse with palpation per night team.  EKG was unchanged from previous EKG on 7/14. Trops were 18 on 7/17 and 19 today. Pt used lidocaine gel for the pain last night. Today he reports the pain is gone.  -Repeat EKG scheduled for this afternoon  Transaminitis Suspected to be caused by inflammation d/t bacteria or malignancy. AST 58 today, ALT 82.    Anemia Hgb stable at 9.4 -Daily CBC  Hypokalemia Up from  3.3>3.9.   HTN BP 133/66 today -continue Amlodipine  BPH -Continue flomax  FEN/GI: Regular PPx: Lovenox Dispo:Home today or tomorrow.     Subjective:  Pt states his pain in his back and legs is 7/10 this morning but sharp shooting foot pain is gone. He would like to go home to see his family ASAP  Objective: Temp:  [97.9 F (36.6 C)-98.4 F (36.9 C)] 98.4 F (36.9 C) (07/18 0514) Pulse Rate:  [61-68] 68 (07/18 0514) Resp:  [18] 18 (07/18 0514) BP: (120-132)/(60-74) 128/68 (07/18 0514) SpO2:  [95 %-99 %] 99 % (07/18 0514) Physical Exam: General: Resting comfortably in bed, NAD Cardiovascular: RRR, normal S1/S2 Respiratory: CTA bilaterally  Abdomen: soft, non tender. Non distended, has nephrostomy tubes   Laboratory: Recent Labs  Lab 10/19/20 0433 10/20/20 0346 10/21/20 0344  WBC 21.2* 17.8* 12.9*  HGB 9.1* 9.3* 9.4*  HCT 27.9* 28.5* 29.5*  PLT 364 373 421*   Recent Labs  Lab 10/19/20 0433 10/20/20 0346 10/21/20 0344  NA 135 137 137  K 3.2* 3.3* 3.9  CL 102 104 105  CO2 26 25 25   BUN 29* 26* 20  CREATININE 1.51* 1.43* 1.20  CALCIUM 8.3* 8.6* 8.5*  PROT 6.8 6.8 6.6  BILITOT 0.7 0.4 0.5  ALKPHOS 128* 121 120  ALT 91* 81* 82*  AST 66* 56* 58*  GLUCOSE 131* 104* 97     [name], DO 10/21/2020, 7:24 AM PGY-1, [place] Intern pager: [phone], text pages welcome

## 2020-10-21 NOTE — Progress Notes (Signed)
FPTS Brief Progress Note  S: Patient reports multiple episodes of diarrhea today and on going shooting pains down his legs. Denies further chest pain episodes.   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 Labs for AM ordered, which was adjusted as needed.   Lattie Haw, MD 10/21/2020, 10:05 PM PGY-3, [facility] Family Medicine Night Resident  Please page [phone] with questions.

## 2020-10-21 NOTE — Progress Notes (Signed)
Initial Nutrition Assessment  DOCUMENTATION CODES:   Not applicable  INTERVENTION:   -Ensure Enlive po TID, each supplement provides 350 kcal and 20 grams of protein  -MVI with minerals daily -Magic cup TID with meals, each supplement provides 290 kcal and 9 grams of protein   NUTRITION DIAGNOSIS:   Increased nutrient needs related to chronic illness (metastatic prostate cancer) as evidenced by estimated needs.  GOAL:   Patient will meet greater than or equal to 90% of their needs  MONITOR:   PO intake, Supplement acceptance, Labs, Weight trends, Skin, I & O's  REASON FOR ASSESSMENT:   Consult Assessment of nutrition requirement/status  ASSESSMENT:   Andre Glass is a 70 y.o. male presenting with altered mental status and increased myoclonic jerking. PMH is significant for metastatic prostate cancer, symptomatic anemia, recurrent UTI,  hypertension, and recent admission for obstructive uropathy with bilateral hydronephrosis and placement of bilateral percutaneous nephrostomy tubes.  Pt admitted with AMS and UTI.   7/15- s/p BSE- advanced to regular consistency diet with thin liquids  Reviewed I/O's: -1.4 L x 24 hours and -2.1 L since admission  UOP: 1.6 L x 24 hours  Pt unavailable at time of visit. RD unable to obtain further nutrition-related history or complete nutrition-focused physical exam at this time.    Pt with poor oral intake. Noted documented meal intake 0% per doc flowsheets.   Reviewed wt hx; pt has experienced a 11% wt loss over the past month, which is significant for time frame. Highly suspect pt with malnutrition, however, unable to identify at this time.   Palliative care following for goals of care discussions; plan for full scope care at this time, however, plans to discuss goals of care furthetr with his family.   Medications reviewed and include miralax, prednisone, sodium chloride, and senna.   Labs reviewed.   Diet Order:   Diet Order              Diet regular Room service appropriate? Yes with Assist; Fluid consistency: Thin  Diet effective now                   EDUCATION NEEDS:   No education needs have been identified at this time  Skin:  Skin Assessment: Reviewed RN Assessment  Last BM:  10/21/20  Height:   Ht Readings from Last 1 Encounters:  10/18/20 5\' 8"  (1.727 m)    Weight:   Wt Readings from Last 1 Encounters:  10/19/20 84.7 kg    Ideal Body Weight:  70 kg  BMI:  Body mass index is 28.39 kg/m.  Estimated Nutritional Needs:   Kcal:  2100-2300  Protein:  125-140 grams  Fluid:  > 2 L    Loistine Chance, RD, LDN, [place] Registered Dietitian II Certified Diabetes Care and Education Specialist Please refer to [facility] for RD and/or RD on-call/weekend/after hours pager

## 2020-10-22 DIAGNOSIS — B961 Klebsiella pneumoniae [K. pneumoniae] as the cause of diseases classified elsewhere: Secondary | ICD-10-CM | POA: Diagnosis not present

## 2020-10-22 DIAGNOSIS — G253 Myoclonus: Secondary | ICD-10-CM | POA: Diagnosis not present

## 2020-10-22 DIAGNOSIS — D649 Anemia, unspecified: Secondary | ICD-10-CM | POA: Diagnosis not present

## 2020-10-22 DIAGNOSIS — R7881 Bacteremia: Secondary | ICD-10-CM | POA: Diagnosis not present

## 2020-10-22 LAB — COMPREHENSIVE METABOLIC PANEL
ALT: 63 U/L — ABNORMAL HIGH (ref 0–44)
AST: 31 U/L (ref 15–41)
Albumin: 2 g/dL — ABNORMAL LOW (ref 3.5–5.0)
Alkaline Phosphatase: 122 U/L (ref 38–126)
Anion gap: 9 (ref 5–15)
BUN: 19 mg/dL (ref 8–23)
CO2: 23 mmol/L (ref 22–32)
Calcium: 9.1 mg/dL (ref 8.9–10.3)
Chloride: 105 mmol/L (ref 98–111)
Creatinine, Ser: 1.13 mg/dL (ref 0.61–1.24)
GFR, Estimated: >60 mL/min (ref >60)
Glucose, Bld: 90 mg/dL (ref 70–99)
Potassium: 4.3 mmol/L (ref 3.5–5.1)
Sodium: 137 mmol/L (ref 135–145)
Total Bilirubin: 0.3 mg/dL (ref 0.3–1.2)
Total Protein: 7.2 g/dL (ref 6.5–8.1)

## 2020-10-22 LAB — CBC
HCT: 31.9 % — ABNORMAL LOW (ref 39.0–52.0)
Hemoglobin: 10.1 g/dL — ABNORMAL LOW (ref 13.0–17.0)
MCH: 28.5 pg (ref 26.0–34.0)
MCHC: 31.7 g/dL (ref 30.0–36.0)
MCV: 90.1 fL (ref 80.0–100.0)
Platelets: 496 10*3/uL — ABNORMAL HIGH (ref 150–400)
RBC: 3.54 MIL/uL — ABNORMAL LOW (ref 4.22–5.81)
RDW: 15.5 % (ref 11.5–15.5)
WBC: 14.7 10*3/uL — ABNORMAL HIGH (ref 4.0–10.5)
nRBC: 0 % (ref 0.0–0.2)

## 2020-10-22 LAB — C DIFFICILE (CDIFF) QUICK SCRN (NO PCR REFLEX)
C Diff antigen: NEGATIVE
C Diff interpretation: NOT DETECTED
C Diff toxin: NEGATIVE

## 2020-10-22 MED ORDER — PREGABALIN 50 MG PO CAPS
50.0000 mg | ORAL_CAPSULE | Freq: Two times a day (BID) | ORAL | Status: DC
  Administered 2020-10-22 – 2020-10-30 (×16): 50 mg via ORAL
  Filled 2020-10-22 (×16): qty 1

## 2020-10-22 NOTE — Care Management Important Message (Signed)
Important Message  Patient Details  Name: Andre Glass MRN: [id number] Date of Birth: 1950-06-28   Medicare Important Message Given:  Yes - Important Message mailed due to current National Emergency   Verbal consent obtained due to current National Emergency  Relationship to patient: Self Contact Name: Yoshito Call Date: 10/22/20  Time: 1643 Phone: [phone] Outcome: No Answer/Busy Important Message mailed to: Patient address on file    Delorse Lek 10/22/2020, 1:45 PM

## 2020-10-22 NOTE — Plan of Care (Signed)
  Problem: Clinical Measurements: Goal: Will remain free from infection Outcome: Completed/Met   Problem: Coping: Goal: Level of anxiety will decrease Outcome: Completed/Met

## 2020-10-22 NOTE — Progress Notes (Signed)
Physical Therapy Treatment Patient Details Name: Andre Glass MRN: [id number] DOB: 05-12-1950 Today's Date: 10/22/2020    History of Present Illness The pt is a 70 yo male presenting 7/14 with AMS and "jerking" movements. The pt was reccently admitted for obstructive uropathy with bilateral hydronephrosis and is now s/p placement of percutaneous nephrostomy tubes bilaterally. Work up this admission revealed UTI (klebsiella). PMH includes: metastatic adenocarinoma of prostate, chornic pain, HTN, and CKD.    PT Comments    On entry pt apologizes for not working with therapy yesterday, but is agreeable today. Pt limited in safe mobility by management of bilateral nephrostomy bags, in presence of generalized weakness. Pt is currently mod I for bed mobility and min guard for transfers and ambulation of 100 feet with RW. D/c plans remain appropriate at this time. PT will continue to follow acutely.    Follow Up Recommendations  Home health PT;Supervision for mobility/OOB     Equipment Recommendations  None recommended by PT (pt well equipped)       Precautions / Restrictions Precautions Precautions: Fall Precaution Comments: bilateral nephrostomy Restrictions Weight Bearing Restrictions: No    Mobility  Bed Mobility Overal bed mobility: Modified Independent             General bed mobility comments: supervision for safety, no physical assist given from flat bed    Transfers Overall transfer level: Needs assistance Equipment used: None Transfers: Sit to/from Stand;Stand Pivot Transfers Sit to Stand: Min guard         General transfer comment: minG for safety good power up and steadying in RW  Ambulation/Gait Ambulation/Gait assistance: Min guard Gait Distance (Feet): 100 Feet Assistive device: Rolling walker (2 wheeled) Gait Pattern/deviations: Step-through pattern Gait velocity: decreased Gait velocity interpretation: <1.8 ft/sec, indicate of risk for recurrent  falls General Gait Details: min guard for safety with slow,deliberate gait, no LoB         Balance Overall balance assessment: Needs assistance Sitting-balance support: No upper extremity supported;Feet supported Sitting balance-Leahy Scale: Good     Standing balance support: Single extremity supported;During functional activity Standing balance-Leahy Scale: Fair Standing balance comment: able to static stand without assist to don mask                            Cognition Arousal/Alertness: Awake/alert Behavior During Therapy: WFL for tasks assessed/performed;Flat affect Overall Cognitive Status: Within Functional Limits for tasks assessed                                 General Comments: pt A&Ox4, continues to have slowed response to questions, most likely his baseline         General Comments General comments (skin integrity, edema, etc.): VSS on RA, nephrostomy pouches pinned to gown to allow for better LE mobility with gait, unpinned after session      Pertinent Vitals/Pain Pain Assessment: 0-10 Pain Score: 5  Pain Location: R abdomen Pain Descriptors / Indicators: Discomfort;Sore Pain Intervention(s): Limited activity within patient's tolerance;Monitored during session;Repositioned     PT Goals (current goals can now be found in the care plan section) Acute Rehab PT Goals Patient Stated Goal: return home PT Goal Formulation: With patient Time For Goal Achievement: 11/02/20 Potential to Achieve Goals: Good Progress towards PT goals: Progressing toward goals    Frequency    Min 3X/week  PT Plan Current plan remains appropriate       AM-PAC PT "6 Clicks" Mobility   Outcome Measure  Help needed turning from your back to your side while in a flat bed without using bedrails?: A Little Help needed moving from lying on your back to sitting on the side of a flat bed without using bedrails?: A Little Help needed moving to and  from a bed to a chair (including a wheelchair)?: A Little Help needed standing up from a chair using your arms (e.g., wheelchair or bedside chair)?: A Little Help needed to walk in hospital room?: A Little Help needed climbing 3-5 steps with a railing? : A Little 6 Click Score: 18    End of Session Equipment Utilized During Treatment: Gait belt Activity Tolerance: Patient tolerated treatment well;Patient limited by fatigue Patient left: with call bell/phone within reach;in chair (no chair in the pt's room) Nurse Communication: Mobility status PT Visit Diagnosis: Unsteadiness on feet (R26.81);Other abnormalities of gait and mobility (R26.89);Muscle weakness (generalized) (M62.81)     Time: 8841-6606 PT Time Calculation (min) (ACUTE ONLY): 19 min  Charges:  $Gait Training: 8-22 mins                     Kairee Kozma B. Migdalia Dk PT, DPT Acute Rehabilitation Services Pager [phone] Office [phone]    [place] 10/22/2020, 3:25 PM

## 2020-10-22 NOTE — Progress Notes (Signed)
SLP Cancellation Note  Patient Details Name: Andre Glass MRN: [id number] DOB: [birth year]   Cancelled treatment:       Reason Eval/Treat Not Completed: SLP screened, no needs identified, will sign off  Pt now alert and appropriate. Denies any difficulty swallowing, says he struggles with large pills sometimes. SLP offered suggestion of large pills whole in puree. No further needs.    Elenore Wanninger, Katherene Ponto 10/22/2020, 9:47 AM

## 2020-10-22 NOTE — Progress Notes (Signed)
Patient complaining of RT Nephrostomy tube leaking. Dressing soaked with urine. Dressing changed. Will update MD in am.

## 2020-10-22 NOTE — Progress Notes (Signed)
Referring Physician(s): Yehuda Savannah  Supervising Physician: Jacqulynn Cadet  Patient Status:  [facility] - In-pt  Chief Complaint: Leaking right PCN  Subjective:  Patient laying in bed, denies complaints currently. He noted leaking from the right nephrostomy insertion site yesterday and today, he has not had any leakage previously at home, gravity bags have been getting very full with urine the last 2 days but he is not sure if this correlates with leaking. He denies any fevers, chills, hematuria or pyuria.  Allergies: Ibuprofen and Shrimp [shellfish allergy]  Medications: Prior to Admission medications   Medication Sig Start Date End Date Taking? Authorizing Provider  amLODipine (NORVASC) 10 MG tablet Take 1 tablet (10 mg total) by mouth daily. 10/05/20  Yes Rai, Ripudeep K, MD  gabapentin (NEURONTIN) 100 MG capsule Take 2 capsules (200 mg total) by mouth 2 (two) times daily. 10/05/20  Yes Rai, Ripudeep K, MD  HYDROmorphone (DILAUDID) 4 MG tablet Take 1 tablet (4 mg total) by mouth every 4 (four) hours as needed for severe pain. 09/27/20  Yes [name], MD  lidocaine-prilocaine (EMLA) cream Apply 1 application topically as needed. Patient taking differently: Apply 1 application topically as needed (pain). 01/15/20  Yes [name], MD  linaclotide Rolan Lipa) 290 MCG CAPS capsule Take 1 capsule (290 mcg total) by mouth daily as needed (constipation, take before breakfast). 10/05/20  Yes Rai, Ripudeep K, MD  morphine (MS CONTIN) 60 MG 12 hr tablet Take 1 tablet (60 mg total) by mouth every 8 (eight) hours. 09/27/20  Yes [name], MD  Multiple Vitamin (MULTIVITAMIN WITH MINERALS) TABS tablet Take 1 tablet by mouth daily.   Yes [provider]  naloxegol oxalate (MOVANTIK) 12.5 MG TABS tablet Take 1 tablet (12.5 mg total) by mouth daily. Take it in the morning 1 hour before breakfast daily for constipation. 10/05/20  Yes Rai, Ripudeep K, MD  ondansetron (ZOFRAN) 4 MG tablet  Take 1 tablet (4 mg total) by mouth 3 (three) times daily as needed for nausea or vomiting. 09/24/20  Yes Shadad, Mathis Dad, MD  Plecanatide (TRULANCE) 3 MG TABS Take 3 mg by mouth daily. 08/08/20  Yes [name], [name], DO  polyethylene glycol (MIRALAX) 17 g packet Take 17 g by mouth 2 (two) times daily. Patient taking differently: Take 17 g by mouth daily as needed for mild constipation. 10/12/20  Yes Carmin Muskrat, MD  prochlorperazine (COMPAZINE) 10 MG tablet Take 1 tablet (10 mg total) by mouth every 6 (six) hours as needed for nausea or vomiting. 09/24/20  Yes Shadad, Mathis Dad, MD  senna-docusate (SENOKOT-S) 8.6-50 MG tablet Take 2 tablets by mouth daily. 10/12/20 11/11/20 Yes Carmin Muskrat, MD  tamsulosin (FLOMAX) 0.4 MG CAPS capsule TAKE 1 CAPSULE (0.4 MG TOTAL) BY MOUTH DAILY AS NEEDED (TO HELP WITH URINATION). 09/03/20  Yes [name], [name], DO  vitamin C (ASCORBIC ACID) 500 MG tablet Take 1,000 mg by mouth daily.   Yes [provider]  Vitamin E 268 MG (400 UNIT) CHEW Chew 1 tablet by mouth daily.   Yes [provider]  glycerin adult 2 g suppository Place 1 suppository rectally as needed for constipation. Patient not taking: No sig reported 10/05/20   Rai, Vernelle Emerald, MD  NONFORMULARY OR COMPOUNDED ITEM Apply 1-2 g topically 4 (four) times daily. Patient not taking: Reported on 10/18/2020 04/24/20   Marzetta Board, DPM  predniSONE (DELTASONE) 5 MG tablet TAKE 1 TABLET BY MOUTH EVERY DAY WITH BREAKFAST 10/18/20  [name], MD     Vital Signs: BP (!) 162/82   Pulse 75   Temp 97.6 F (36.4 C) (Oral)   Resp 17   Ht 5\' 8"  (1.727 m)   Wt 180 lb 12.4 oz (82 kg)   SpO2 100%   BMI 27.49 kg/m   Physical Exam Vitals and nursing note reviewed.  Constitutional:      General: He is not in acute distress. HENT:     Head: Normocephalic.  Cardiovascular:     Rate and Rhythm: Normal rate.  Pulmonary:     Effort: Pulmonary effort is normal.  Abdominal:     Comments: (+)  Right PCN to gravity draining clear yellow urine. Suture and stat lock are in tact. No leakage noted upon initial assessment. PCN flushed easily with 10 cc NS without pain or leakage from insertion site.  Skin:    General: Skin is warm and dry.  Neurological:     Mental Status: He is alert. Mental status is at baseline.    Imaging: US Abdomen Complete  Result Date: 10/18/2020 CLINICAL DATA:  Urinary tract infection, fever EXAM: ABDOMEN ULTRASOUND COMPLETE COMPARISON:  CT 09/28/2020 FINDINGS: Gallbladder: No gallstones or wall thickening visualized. No sonographic Murphy sign noted by sonographer. Common bile duct: Diameter: 3 mm Liver: No focal lesion identified. Within normal limits in parenchymal echogenicity. Portal vein is patent on color Doppler imaging with normal direction of blood flow towards the liver. IVC: No abnormality visualized. Pancreas: Visualized portion unremarkable. Spleen: Size and appearance within normal limits. Right Kidney: Length: 10.8 cm. Mild hydronephrosis. No obstructing lesion. Left Kidney: Length: 12.1 cm. Echogenicity within normal limits. No mass or hydronephrosis visualized. Abdominal aorta: No aneurysm visualized. Other findings: The bladder wall is thickened. Masslike echogenicity within the lumen the bladder measuring 3.8 x 3.9 x 3.7 cm. IMPRESSION: 1. Thickened bladder with masslike echogenicity within lumen the bladder. Concern for bladder neoplasm or hematoma. 2. Mild RIGHT hydronephrosis without obstructing lesion identified. 3. RIGHT kidney and bladder findings similar to CT 09/28/2020 with high concern for advanced prostate carcinoma with local invasion of the bladder. 4. LEFT kidney normal. 5. Liver and gallbladder normal. Electronically Signed   By: Suzy Bouchard M.D.   On: 10/18/2020 17:28    Labs:  CBC: Recent Labs    10/19/20 0433 10/20/20 0346 10/21/20 0344 10/22/20 0344  WBC 21.2* 17.8* 12.9* 14.7*  HGB 9.1* 9.3* 9.4* 10.1*  HCT 27.9* 28.5*  29.5* 31.9*  PLT 364 373 421* 496*    COAGS: Recent Labs    10/31/19 1139  INR 1.0    BMP: Recent Labs    10/31/19 1139 11/29/19 1306 01/12/20 0852 10/19/20 0433 10/20/20 0346 10/21/20 0344 10/22/20 0344  NA 142 142   < > 135 137 137 137  K 3.6 3.5   < > 3.2* 3.3* 3.9 4.3  CL 103 106   < > 102 104 105 105  CO2 32 30   < > 26 25 25 23   GLUCOSE 86 121*   < > 131* 104* 97 90  BUN 15 16   < > 29* 26* 20 19  CALCIUM 9.2 10.1   < > 8.3* 8.6* 8.5* 9.1  CREATININE 0.82 1.35*   < > 1.51* 1.43* 1.20 1.13  GFRNONAA >60 53*   < > 49* 53* >60 >60  GFRAA >60 >60  --   --   --   --   --    < > =  values in this interval not displayed.    LIVER FUNCTION TESTS: Recent Labs    10/19/20 0433 10/20/20 0346 10/21/20 0344 10/22/20 0344  BILITOT 0.7 0.4 0.5 0.3  AST 66* 56* 58* 31  ALT 91* 81* 82* 63*  ALKPHOS 128* 121 120 122  PROT 6.8 6.8 6.6 7.2  ALBUMIN 1.8* 1.8* 1.7* 2.0*    Assessment and Plan:  70 y/o M s/p right and left PCN placement 09/29/20 in IR seen today due to concern for leakage from right PCN insertion site.  On my exam this afternoon I did not note any leakage before or after flushing, the catheter flushed easily with 10 cc NS and clear yellow urine is draining in the bag. Catheter appears to be in the appropriate position, suture and stat lock are in tact. Patient did note that gravity bags have been getting very full before being changed. Will have floor staff flush PCNs QD and empty gravity bags more frequently to see if this improves leakage.  Patient previously planned for attempted antegrade ureteral stent placement and possible PCN exchange this week as an outpatient, however he is currently admitted for klebsiella bacteremia and possible c.diff. Once c.diff is either ruled out or resolved we can discuss proceeding with this as an inpatient vs rescheduling as outpatient.  IR will continue to follow.  Electronically Signed: Joaquim Nam,  PA-C 10/22/2020, 1:14 PM   I spent a total of 25 Minutes at the the patient's bedside AND on the patient's hospital floor or unit, greater than 50% of which was counseling/coordinating care for leaking right PCN.

## 2020-10-23 DIAGNOSIS — R400 Somnolence: Secondary | ICD-10-CM | POA: Diagnosis not present

## 2020-10-23 DIAGNOSIS — D649 Anemia, unspecified: Secondary | ICD-10-CM | POA: Diagnosis not present

## 2020-10-23 DIAGNOSIS — E43 Unspecified severe protein-calorie malnutrition: Secondary | ICD-10-CM | POA: Diagnosis not present

## 2020-10-23 DIAGNOSIS — R7881 Bacteremia: Secondary | ICD-10-CM | POA: Diagnosis not present

## 2020-10-23 LAB — CULTURE, BLOOD (ROUTINE X 2)

## 2020-10-23 LAB — COMPREHENSIVE METABOLIC PANEL
ALT: 58 U/L — ABNORMAL HIGH (ref 0–44)
AST: 34 U/L (ref 15–41)
Albumin: 2.3 g/dL — ABNORMAL LOW (ref 3.5–5.0)
Alkaline Phosphatase: 139 U/L — ABNORMAL HIGH (ref 38–126)
Anion gap: 10 (ref 5–15)
BUN: 21 mg/dL (ref 8–23)
CO2: 21 mmol/L — ABNORMAL LOW (ref 22–32)
Calcium: 9.4 mg/dL (ref 8.9–10.3)
Chloride: 105 mmol/L (ref 98–111)
Creatinine, Ser: 1.24 mg/dL (ref 0.61–1.24)
GFR, Estimated: >60 mL/min (ref >60)
Glucose, Bld: 102 mg/dL — ABNORMAL HIGH (ref 70–99)
Potassium: 4.3 mmol/L (ref 3.5–5.1)
Sodium: 136 mmol/L (ref 135–145)
Total Bilirubin: 0.5 mg/dL (ref 0.3–1.2)
Total Protein: 8.3 g/dL — ABNORMAL HIGH (ref 6.5–8.1)

## 2020-10-23 LAB — CBC
HCT: 35.3 % — ABNORMAL LOW (ref 39.0–52.0)
Hemoglobin: 11.2 g/dL — ABNORMAL LOW (ref 13.0–17.0)
MCH: 28.4 pg (ref 26.0–34.0)
MCHC: 31.7 g/dL (ref 30.0–36.0)
MCV: 89.4 fL (ref 80.0–100.0)
Platelets: 533 10*3/uL — ABNORMAL HIGH (ref 150–400)
RBC: 3.95 MIL/uL — ABNORMAL LOW (ref 4.22–5.81)
RDW: 15.4 % (ref 11.5–15.5)
WBC: 19.3 10*3/uL — ABNORMAL HIGH (ref 4.0–10.5)
nRBC: 0 % (ref 0.0–0.2)

## 2020-10-23 NOTE — Progress Notes (Signed)
Occupational Therapy Treatment Patient Details Name: Andre Glass MRN: [id number] DOB: 26-Jun-1950 Today's Date: 10/23/2020    History of present illness The pt is a 70 yo male presenting 7/14 with AMS and "jerking" movements. The pt was reccently admitted for obstructive uropathy with bilateral hydronephrosis and is now s/p placement of percutaneous nephrostomy tubes bilaterally. Work up this admission revealed UTI (klebsiella). PMH includes: metastatic adenocarinoma of prostate, chornic pain, HTN, and CKD.   OT comments  Pt progressing well towards acute OT goals. Completed bathing tasks standing at sink, setup/supervision level; HR 113. Up in recliner at end of session. D/c plan remains appropriate.    Follow Up Recommendations  Home health OT;Supervision - Intermittent    Equipment Recommendations  None recommended by OT    Recommendations for Other Services      Precautions / Restrictions Precautions Precautions: Fall Precaution Comments: bilateral nephrostomy Restrictions Weight Bearing Restrictions: No       Mobility Bed Mobility               General bed mobility comments: OOB during session    Transfers Overall transfer level: Needs assistance Equipment used: None Transfers: Sit to/from Stand Sit to Stand: Supervision         General transfer comment: supervision for safety. to/from EOB and recliner.    Balance Overall balance assessment: Needs assistance Sitting-balance support: No upper extremity supported;Feet supported Sitting balance-Leahy Scale: Good     Standing balance support: No upper extremity supported;Single extremity supported;During functional activity Standing balance-Leahy Scale: Fair Standing balance comment: able to stand at sink to complete bathing tasks. Intermittent single UE support.                           ADL either performed or assessed with clinical judgement   ADL Overall ADL's : Needs  assistance/impaired         Upper Body Bathing: Set up;Sitting;Standing;Supervision/ safety   Lower Body Bathing: Supervison/ safety;Sit to/from stand                       Functional mobility during ADLs: Supervision/safety (in the room.) General ADL Comments: Upon arrival of OT, pt standing at sink completing sponge bath. HR 113. No physical assist needed, only setup/supervision.     Vision       Perception     Praxis      Cognition Arousal/Alertness: Awake/alert Behavior During Therapy: WFL for tasks assessed/performed;Flat affect Overall Cognitive Status: Within Functional Limits for tasks assessed                                 General Comments: Delayed responses at times, takes a little extra time to express his thoughts. baseline?        Exercises     Shoulder Instructions       General Comments HR 113 during bathing tasks standing at sink    Pertinent Vitals/ Pain       Pain Assessment: Faces Faces Pain Scale: Hurts even more Pain Location: "stomach" d/t ongoing diarrhea Pain Descriptors / Indicators: Discomfort;Sore  Home Living                                          Prior Functioning/Environment  Frequency  Min 2X/week        Progress Toward Goals  OT Goals(current goals can now be found in the care plan section)  Progress towards OT goals: Progressing toward goals  Acute Rehab OT Goals Patient Stated Goal: return home OT Goal Formulation: With patient Time For Goal Achievement: 11/03/20 Potential to Achieve Goals: Good ADL Goals Pt Will Perform Grooming: with modified independence;standing;sitting Pt Will Perform Lower Body Bathing: with modified independence;sit to/from stand Pt Will Transfer to Toilet: with modified independence;ambulating Pt Will Perform Toileting - Clothing Manipulation and hygiene: with modified independence;sit to/from stand  Plan Discharge plan  remains appropriate    Co-evaluation                 AM-PAC OT "6 Clicks" Daily Activity     Outcome Measure   Help from another person eating meals?: None Help from another person taking care of personal grooming?: None Help from another person toileting, which includes using toliet, bedpan, or urinal?: A Little Help from another person bathing (including washing, rinsing, drying)?: A Little Help from another person to put on and taking off regular upper body clothing?: None Help from another person to put on and taking off regular lower body clothing?: A Little 6 Click Score: 21    End of Session    OT Visit Diagnosis: Unsteadiness on feet (R26.81);Muscle weakness (generalized) (M62.81);Pain;History of falling (Z91.81) Pain - Right/Left: Right Pain - part of body: Hip;Leg   Activity Tolerance Patient tolerated treatment well   Patient Left in chair;with call bell/phone within reach   Nurse Communication          Time: 1050-1107 OT Time Calculation (min): 17 min  Charges: OT General Charges $OT Visit: 1 Visit OT Treatments $Self Care/Home Management : 8-22 mins  Tyrone Schimke, OT Acute Rehabilitation Services Pager: [phone] Office: [phone]    Hortencia Pilar 10/23/2020, 2:05 PM

## 2020-10-23 NOTE — Progress Notes (Signed)
Family Medicine Teaching Service Daily Progress Note Intern Pager: [phone]  Patient name: Andre Glass Medical record number: [id number] Date of birth: 1951-04-04 Age: 70 y.o. Gender: male  Primary Care Provider: [name], DO Consultants: IR, palliative Code Status: DNR  Pt Overview and Major Events to Date:  7/15-admitted  Assessment and Plan: Patient is a 70 year old male presenting with altered mental status and increased myoclonic jerking.  This has resolved.  Past medical history significant for metastatic prostate cancer, symptomatic anemia, recurrent UTI, HTN, and recent admission for obstructive uropathy requiring bilateral nephrostomy tubes  UTI  bacteremia Patient completed 2 days of IV ceftriaxone on 7/18.  Is now on oral Keflex.  WBC up from 14.7>19.3.  He remains afebrile. -Continue oral Keflex 500 mg every 6 hours -A.m. CBC -Monitor vitals  Diarrhea Patient started having watery diarrhea on 7/18, denies any blood.  C. diff negative. Watery diarrhea has not improved and pt still has significant diffuse abdominal pain. He is making an effort to stay hydrated with water and gingerale. Increase in WBC began around the same time as diarrhea. I suspect they are related. Will do a GI panel to check for common bacterial and viral causes of diarrhea. Will consider Abdominal XR if GI panel negative and still in pain tomorrow.  Obstructive uropathy with bilateral nephrostomy tubes Patient states right nephrostomy tube started leaking on the evening of 7/18.  On morning exam of 7/19, [place] 02 with stent.  IR was consulted.  IR states they did not see any leakage before after flushing, the catheter appeared to be in the appropriate position.  They recommend tubes to be flushed every day and gravity bag to be emptied more frequently to see if the leakage improves.  Metastatic adenocarcinoma of prostate, chronic cancer patient -Continue hydromorphone 6-8 mg q4h prn -Continue lyrica  50 mg BID  Anemia Hemoglobin stable at 11.2 Platelets increased from 496>533  Transaminitis Suspected to be due to inflammation from infection or malignancy.  AST WNL today, ALT down from 63>58.  Alkaline phosphatase has increased from 122>139  Hypertension Blood pressure today is 161/94 -Continue amlodipine  BPH -Continue flomax  FEN/GI: Regular PPx: Lovenox Dispo:Home in 2-3 days. Barriers include continued diarrhea.   Subjective:  Pt states diarrhea has not improved and kept him up all night. He is still drinking fluids.  States his back and leg pain are well controlled now.  He still has frequent neuropathy in his feet but it is not as painful with the Lyrica.   Objective: Temp:  [97.5 F (36.4 C)-98.3 F (36.8 C)] 98.3 F (36.8 C) (07/20 0503) Pulse Rate:  [73-82] 73 (07/20 0503) Resp:  [16-17] 16 (07/20 0503) BP: (103-162)/(82-94) 161/94 (07/20 0503) SpO2:  [100 %] 100 % (07/20 0503) Physical Exam: General: Pt sitting up in bed, NAD Cardiovascular: RRR, no murmurs Respiratory: CTAB Abdomen: Diffuse tenderness to palpation, no guarding, non distended Extremities: no edema  Laboratory: Recent Labs  Lab 10/21/20 0344 10/22/20 0344 10/23/20 0435  WBC 12.9* 14.7* 19.3*  HGB 9.4* 10.1* 11.2*  HCT 29.5* 31.9* 35.3*  PLT 421* 496* 533*   Recent Labs  Lab 10/21/20 0344 10/22/20 0344 10/23/20 0435  NA 137 137 136  K 3.9 4.3 4.3  CL 105 105 105  CO2 25 23 21*  BUN 20 19 21   CREATININE 1.20 1.13 1.24  CALCIUM 8.5* 9.1 9.4  PROT 6.6 7.2 8.3*  BILITOT 0.5 0.3 0.5  ALKPHOS 120 122 139*  ALT 82*  63* 58*  AST 58* 31 34  GLUCOSE 97 90 102[name], DO 10/23/2020, 6:45 AM PGY-1, [place] Intern pager: [phone], text pages welcome

## 2020-10-23 NOTE — Progress Notes (Signed)
FPTS Interim Progress Note  S:Andre Glass reports feeling better this afternoon and that his diarrhea is slowing down.  He states that he has been able to drink but has not yet had a "good meal". He plans on trying to eat a large dinner to see how he feels. No nausea/vomiting. Denies dry mouth.   O: BP 140/83 (BP Location: Right Arm)   Pulse 72   Temp 98 F (36.7 C) (Oral)   Resp 17   Ht 5\' 8"  (1.727 m)   Wt 82 kg   SpO2 100%   BMI 27.49 kg/m   General: Resting comfortably in bed HENT: Mucous membranes moist  Cardio: RRR, no m/r/g Resp: Lungs CTAB, nl work of breathing on RA Abd: Diffusely TTP, bowel sounds present Extremities: Without edema   A/P: Appears to be staying hydrated, able to maintain PO fluid intake - F/u GIPP - If no improvement by tomorrow, will obtain KUB - If rapid detoriation/increased pain/fever, will order CT abd   Eppie Gibson, MD 10/23/2020, 4:23 PM PGY-1, [place] pager [phone]

## 2020-10-23 NOTE — Plan of Care (Signed)
  Problem: Elimination: Goal: Will not experience complications related to bowel motility Outcome: Progressing   Problem: Pain Managment: Goal: General experience of comfort will improve Outcome: Progressing   Problem: Safety: Goal: Ability to remain free from injury will improve Outcome: Progressing   Problem: Urinary Elimination: Goal: Signs and symptoms of infection will decrease Outcome: Progressing

## 2020-10-24 ENCOUNTER — Other Ambulatory Visit: Payer: Self-pay | Admitting: Family Medicine

## 2020-10-24 ENCOUNTER — Ambulatory Visit (HOSPITAL_COMMUNITY)
Admission: RE | Admit: 2020-10-24 | Discharge: 2020-10-24 | Disposition: A | Discharge status: Home / Self Care | Payer: Medicare Other | Source: Ambulatory Visit | Attending: Urology | Admitting: Urology

## 2020-10-24 DIAGNOSIS — Z789 Other specified health status: Secondary | ICD-10-CM

## 2020-10-24 DIAGNOSIS — Z515 Encounter for palliative care: Secondary | ICD-10-CM | POA: Diagnosis not present

## 2020-10-24 DIAGNOSIS — Z7189 Other specified counseling: Secondary | ICD-10-CM | POA: Diagnosis not present

## 2020-10-24 LAB — GASTROINTESTINAL PANEL BY PCR, STOOL (REPLACES STOOL CULTURE)

## 2020-10-24 LAB — COMPREHENSIVE METABOLIC PANEL
ALT: 53 U/L — ABNORMAL HIGH (ref 0–44)
AST: 30 U/L (ref 15–41)
Albumin: 2.2 g/dL — ABNORMAL LOW (ref 3.5–5.0)
Alkaline Phosphatase: 130 U/L — ABNORMAL HIGH (ref 38–126)
Anion gap: 8 (ref 5–15)
BUN: 25 mg/dL — ABNORMAL HIGH (ref 8–23)
CO2: 23 mmol/L (ref 22–32)
Calcium: 9.4 mg/dL (ref 8.9–10.3)
Chloride: 104 mmol/L (ref 98–111)
Creatinine, Ser: 1.32 mg/dL — ABNORMAL HIGH (ref 0.61–1.24)
GFR, Estimated: 58 mL/min — ABNORMAL LOW (ref >60)
Glucose, Bld: 83 mg/dL (ref 70–99)
Potassium: 5.1 mmol/L (ref 3.5–5.1)
Sodium: 135 mmol/L (ref 135–145)
Total Bilirubin: 0.5 mg/dL (ref 0.3–1.2)
Total Protein: 7.7 g/dL (ref 6.5–8.1)

## 2020-10-24 LAB — CBC
HCT: 35 % — ABNORMAL LOW (ref 39.0–52.0)
Hemoglobin: 10.9 g/dL — ABNORMAL LOW (ref 13.0–17.0)
MCH: 28.3 pg (ref 26.0–34.0)
MCHC: 31.1 g/dL (ref 30.0–36.0)
MCV: 90.9 fL (ref 80.0–100.0)
Platelets: 524 10*3/uL — ABNORMAL HIGH (ref 150–400)
RBC: 3.85 MIL/uL — ABNORMAL LOW (ref 4.22–5.81)
RDW: 15.5 % (ref 11.5–15.5)
WBC: 17.2 10*3/uL — ABNORMAL HIGH (ref 4.0–10.5)
nRBC: 0 % (ref 0.0–0.2)

## 2020-10-24 MED ORDER — TAMSULOSIN HCL 0.4 MG PO CAPS: 0.4000 mg | ORAL_CAPSULE | Freq: Every day | ORAL | 0 refills | Status: DC | PRN

## 2020-10-24 MED ORDER — POLYETHYLENE GLYCOL 3350 17 G PO PACK
17.0000 g | PACK | Freq: Every day | ORAL | Status: DC
  Administered 2020-10-24: 17 g via ORAL
  Filled 2020-10-24: qty 1

## 2020-10-24 NOTE — Telephone Encounter (Signed)
Original refill failed to transmit to pharmacy. Resent electronically. Received confirmation of receipt from pharmacy.   Talbot Grumbling, RN

## 2020-10-24 NOTE — Progress Notes (Signed)
Family Medicine Teaching Service Daily Progress Note Intern Pager: [phone]  Patient name: Andre Glass Medical record number: [id number] Date of birth: [birth year] Age: 70 y.o. Gender: male  Primary Care Provider: [name], DO Consultants: Palliative, IR Code Status: DNR  Pt Overview and Major Events to Date:  7/15-admitted  Assessment and Plan: Patient 70 year old male who initially presented with AMS and increased myoclonic jerking. Both have resolved.  PMH is significant for metastatic prostate cancer, symptomatic anemia, recurrent UTI, HTN, recent admission for obstructive uropathy requiring bilateral nephrostomy tubes  UTI  bacteremia Patient completed 2 days of IV ceftriaxone on 7/18 and is now on oral Keflex with an end date of 7/31 for total of 14 days of antibiotics.  WBCs had decreased from 17.8-12.9 on 7/18 but then started to rise again to 19.3 on 7/20.  Today they have decreased to 17.2. -Continue oral Keflex 500 mg every 6 hours -Morning CBC -Monitor vitals  Diarrhea Patient started having watery diarrhea on 7/18, denies any blood.  C. difficile negative.  WBC has decreased from 19.3>17.2.  WBCs had begun to increase at the same time he began having watery diarrhea.  I suspect they are related.  GI panel is in process.  Patient states he has not had any diarrhea since yesterday and that his abdominal pain has greatly improved.  Obstructive uropathy with bilateral nephrostomy tubes Patient had previously had some leakage from his right nephrostomy tube.  IR evaluated this on 7/19 and recommended more frequent emptying of bags and flushing tubes every day.  He was initially planned to have stent placement and possible PCN exchange this week.  IR will continue to follow and recommend proceeding with this as inpatient versus rescheduling outpatient.  -Follow IR recommendations  Metastatic adenocarcinoma of the prostate, chronic cancer patient -Continue hydromorphone 6 to 8  mg every 4 hours as needed -Continue Lyrica 50 mg twice daily  Anemia Hemoglobin today is 10.9, platelets are 524  Transaminitis This is suspected to be a result of inflammation due to bacteremia or cancer.  AST is now WNL for the past 3 days, ALT is still slightly elevated at 53.  Hypertension Blood pressure today is 146/77 -Continue amlodipine  BPH -Continue Flomax  FEN/GI: Regular PPx: Lovenox Dispo:Home today. Barriers include PCN exchange and ureteral stent placement  Subjective:  Patient was sitting up comfortably in bed.  States he has not had any diarrhea since yesterday and his abdominal pain has greatly improved.  He was able to eat dinner last night and breakfast this morning.  He is feeling much better and would like to have his ureteral stents placed and bilateral nephrostomy tubes exchanged before he goes home.  Objective: Temp:  [98 F (36.7 C)-98.5 F (36.9 C)] 98.4 F (36.9 C) (07/21 0456) Pulse Rate:  [70-73] 73 (07/21 0456) Resp:  [17-18] 18 (07/21 0456) BP: (138-146)/(77-83) 146/77 (07/21 0456) SpO2:  [97 %-100 %] 97 % (07/21 0456) Physical Exam: General: Patient sitting up comfortably in bed, no acute distress Cardiovascular: RRR, normal S1/S2, no murmurs Respiratory: CTA bilaterally Abdomen: Soft, nondistended, slight diffuse tenderness to palpation Extremities: No edema  Laboratory: Recent Labs  Lab 10/22/20 0344 10/23/20 0435 10/24/20 0433  WBC 14.7* 19.3* 17.2*  HGB 10.1* 11.2* 10.9*  HCT 31.9* 35.3* 35.0*  PLT 496* 533* 524*   Recent Labs  Lab 10/22/20 0344 10/23/20 0435 10/24/20 0433  NA 137 136 135  K 4.3 4.3 5.1  CL 105 105 104  CO2 23  21* 23  BUN 19 21 25*  CREATININE 1.13 1.24 1.32*  CALCIUM 9.1 9.4 9.4  PROT 7.2 8.3* 7.7  BILITOT 0.3 0.5 0.5  ALKPHOS 122 139* 130*  ALT 63* 58* 53*  AST 31 34 30  GLUCOSE 90 102* 83      [name], DO 10/24/2020, 7:08 AM PGY-1, [place] Intern pager:  [phone], text pages welcome

## 2020-10-24 NOTE — Progress Notes (Signed)
Physical Therapy Treatment Patient Details Name: Andre Glass MRN: [id number] DOB: Jan 02, 1951 Today's Date: 10/24/2020    History of Present Illness The pt is a 70 yo male presenting 7/14 with AMS and "jerking" movements. The pt was reccently admitted for obstructive uropathy with bilateral hydronephrosis and is now s/p placement of percutaneous nephrostomy tubes bilaterally. Work up this admission revealed UTI (klebsiella). PMH includes: metastatic adenocarinoma of prostate, chornic pain, HTN, and CKD.    PT Comments    Pt up in chair, brother in room. Pt eager to ambulate with therapy. Pt limited in safe mobility by decrease knowledge of deficits in presence of generalized weakness. Pt mod I for transfers and min guard for ambulation with RW. Pt unable to gage his abilities, with increasing L foot drag causing minor LoB. With seated rest break able to continue gait safely. Discussed use of Rollator for safety. Pt in agreement. D/c plan remains appropriate. PT will continue to follow acutely.    Follow Up Recommendations  Home health PT;Supervision for mobility/OOB     Equipment Recommendations  None recommended by PT (pt well equipped)       Precautions / Restrictions Precautions Precautions: Fall Precaution Comments: bilateral nephrostomy Restrictions Weight Bearing Restrictions: No    Mobility  Bed Mobility               General bed mobility comments: sitting up in recliner on entry    Transfers Overall transfer level: Needs assistance Equipment used: None Transfers: Sit to/from Stand;Stand Pivot Transfers Sit to Stand: Min guard         General transfer comment: minG for safety good power up and steadying in RW  Ambulation/Gait Ambulation/Gait assistance: Min guard Gait Distance (Feet): 360 Feet Assistive device: Rolling walker (2 wheeled) Gait Pattern/deviations: Step-through pattern Gait velocity: decreased Gait velocity interpretation: <1.8 ft/sec,  indicate of risk for recurrent falls General Gait Details: min guard for safety with slow,deliberate gait, pt with decreased L foot clearance at 180 feet ambulation, requires seated rest break at 280 feet, after 3 min rest break able to safely return to room         Balance Overall balance assessment: Needs assistance Sitting-balance support: No upper extremity supported;Feet supported Sitting balance-Leahy Scale: Good     Standing balance support: Single extremity supported;During functional activity Standing balance-Leahy Scale: Fair Standing balance comment: able to static stand without assist to don mask                            Cognition Arousal/Alertness: Awake/alert Behavior During Therapy: WFL for tasks assessed/performed;Flat affect Overall Cognitive Status: Within Functional Limits for tasks assessed                                 General Comments: pt A&Ox4, continues to have slowed response to questions, most likely his baseline      Exercises      General Comments General comments (skin integrity, edema, etc.): VSS on RA      Pertinent Vitals/Pain Pain Assessment: 0-10 Pain Score: 4  Pain Location: nephrostomy sites Pain Descriptors / Indicators: Discomfort;Sore Pain Intervention(s): Limited activity within patient's tolerance;Monitored during session;Repositioned     PT Goals (current goals can now be found in the care plan section) Acute Rehab PT Goals Patient Stated Goal: return home PT Goal Formulation: With patient Time For Goal Achievement: 11/02/20 Potential  to Achieve Goals: Good Progress towards PT goals: Progressing toward goals    Frequency    Min 3X/week      PT Plan Current plan remains appropriate       AM-PAC PT "6 Clicks" Mobility   Outcome Measure  Help needed turning from your back to your side while in a flat bed without using bedrails?: A Little Help needed moving from lying on your back to  sitting on the side of a flat bed without using bedrails?: A Little Help needed moving to and from a bed to a chair (including a wheelchair)?: A Little Help needed standing up from a chair using your arms (e.g., wheelchair or bedside chair)?: A Little Help needed to walk in hospital room?: A Little Help needed climbing 3-5 steps with a railing? : A Little 6 Click Score: 18    End of Session Equipment Utilized During Treatment: Gait belt Activity Tolerance: Patient tolerated treatment well;Patient limited by fatigue Patient left: with call bell/phone within reach;in chair Nurse Communication: Mobility status PT Visit Diagnosis: Unsteadiness on feet (R26.81);Other abnormalities of gait and mobility (R26.89);Muscle weakness (generalized) (M62.81)     Time: 9021-1155 PT Time Calculation (min) (ACUTE ONLY): 28 min  Charges:  $Gait Training: 23-37 mins                     Male Iafrate B. Migdalia Dk PT, DPT Acute Rehabilitation Services Pager [phone] Office [phone]    [place] 10/24/2020, 1:34 PM

## 2020-10-24 NOTE — Addendum Note (Signed)
Addended by: Talbot Grumbling on: 10/24/2020 02:59 PM   Modules accepted: Orders

## 2020-10-24 NOTE — Progress Notes (Addendum)
Palliative Medicine Inpatient Follow Up Note  Reason for consult:  "GOC-multiple comorbidities including metastatic prostate cancer and bacteremia"   HPI:  Per intake H&P --> Andre Glass is a 70 y.o. male presenting with altered mental status and increased myoclonic jerking. PMH is significant for metastatic prostate cancer, symptomatic anemia, recurrent UTI,  hypertension, and recent admission for obstructive uropathy with bilateral hydronephrosis and placement of bilateral percutaneous nephrostomy tubes.   Patient was seen by PMT in October of 2021 for symptom management.   Today's Discussion (10/24/2020):  *Please note that this is a verbal dictation therefore any spelling or grammatical errors are due to the "[place] One" system interpretation.  Chart reviewed. WBC's are down-trending today. Received 21m of dilaudid in the last 24 hours = 1076mof morphine equivalents.   I met with Andre Glass morning. He endorses have had a difficult past few days in the setting of ongoing loose bowels. He shares that one day he had fourteen bowel movements which caused great distress to him as he was in a decent amount of pain and also quite dry from persistently have bowel movements. He shares that as of present he has not had any loose Bms this morning. Overall,  Andre Glass improvements in his pain.He does feel that the dilaudid at an increased dose has been helpful to him as has the lyrica. He is no longer having muscle jerks/shaking.   We discussed the goals for the day which will be to get up and get moving. Andre Glass that he would ideally like to take a shower.   Questions and concerns addressed   Objective Assessment: Vital Signs Vitals:   10/24/20 0456 10/24/20 0922  BP: (!) 146/77 135/78  Pulse: 73 72  Resp: 18 19  Temp: 98.4 F (36.9 C) 98 F (36.7 C)  SpO2: 97% 99%    Intake/Output Summary (Last 24 hours) at 10/24/2020 1255 Last data filed at 10/24/2020 0900 Gross per  24 hour  Intake 780 ml  Output 875 ml  Net -95 ml    Last Weight  Most recent update: 10/22/2020  5:42 AM    Weight  82 kg (180 lb 12.4 oz)            Gen:  Frail AA M chronically ill in appearance HEENT: Dry mucous membranes CV: Regular rate and rhythm PULM: On 2LPM ABD: soft/nontender EXT: No edema Neuro: Alert and oriented x3   SUMMARY OF RECOMMENDATIONS   Full Code / Full Scope of Care - Patient would like time to gather his family together to discuss goals of care   Spiritual Advanced Care Planning - Provided Packet for family to further discuss   A MOST for was provided for review and completion   Will recommend OP Palliative Care   Ongoing Support for [place]   Code Status/Advance Care Planning: FULL CODE   Symptom Management:  Loose Bowel Movements:  - C.Diff (-)  - GI Panel pending  - Bowel agents being held   Metastatic Pain:                 - Continue Dilaudid dilaudid to 6 - 8 mg based on the severity of his pain  Neuropathic Pain:  - Gabapentin stopped d/t myoclonus  - Continue on Lyrica    Falls: - Physical activity - Ensure wearing eyeglasses or contact lenses - Educate about the side effects medicines - Ensure adequate sleep - Stand up slowly - Use an  assistive devices - Wear non-skid, rubber-soled, low-heeled shoes, or lace-up shoes with non-skid soles that fully support feet.    Generalized Weakness:                 - PT/OT  - Mobilize daily   Advanced Care Planning/ Spiritual:                 - Chaplain consult   Time Spent: 35 Greater than 50% of the time was spent in counseling and coordination of care ______________________________________________________________________________________ [place] Team Team Cell Phone: [phone] Please utilize secure chat with additional questions, if there is no response within 30 minutes please call the above phone number  Palliative Medicine  Team providers are available by phone from 7am to 7pm daily and can be reached through the team cell phone.  Should this patient require assistance outside of these hours, please call the patient's attending physician.

## 2020-10-24 NOTE — Progress Notes (Signed)
FPTS Brief Progress Note  S: Pt sleeping comfortably in bed this evening. I did not wake the patient. MEWS 0.    O: BP 138/80 (BP Location: Right Arm)   Pulse 70   Temp 98.5 F (36.9 C)   Resp 18   Ht 5\' 8"  (1.727 m)   Wt 82 kg   SpO2 98%   BMI 27.49 kg/m    General: sleeping  Cardio: well perfused  Pulm: normal work of breathing  A/P: UTI, Klebsiella bacteremia -Continue Keflex  Diarrhea C. Difficile neg -Encourage PO hydration  -F/u GIPP  -Consider AXR if persistent pain/negative GIPP   - Orders reviewed. Labs for AM ordered, which was adjusted as needed.    Lattie Haw, MD 10/24/2020, 1:24 AM PGY-3, [facility] Night Resident  Please page [phone] with questions.

## 2020-10-24 NOTE — Progress Notes (Signed)
Placed new gauze over right nephostomy site to evaluate for drainage, checked 2.5 hours later and gauze dry. Will continue to check, notified IR.

## 2020-10-25 ENCOUNTER — Inpatient Hospital Stay (HOSPITAL_COMMUNITY): Payer: Medicare Other

## 2020-10-25 DIAGNOSIS — R109 Unspecified abdominal pain: Secondary | ICD-10-CM

## 2020-10-25 DIAGNOSIS — Z515 Encounter for palliative care: Secondary | ICD-10-CM | POA: Diagnosis not present

## 2020-10-25 HISTORY — PX: IR NEPHROSTOMY EXCHANGE RIGHT: IMG6070

## 2020-10-25 LAB — COMPREHENSIVE METABOLIC PANEL
ALT: 52 U/L — ABNORMAL HIGH (ref 0–44)
AST: 35 U/L (ref 15–41)
Albumin: 2.2 g/dL — ABNORMAL LOW (ref 3.5–5.0)
Alkaline Phosphatase: 127 U/L — ABNORMAL HIGH (ref 38–126)
Anion gap: 7 (ref 5–15)
BUN: 34 mg/dL — ABNORMAL HIGH (ref 8–23)
CO2: 22 mmol/L (ref 22–32)
Calcium: 9.1 mg/dL (ref 8.9–10.3)
Chloride: 105 mmol/L (ref 98–111)
Creatinine, Ser: 1.35 mg/dL — ABNORMAL HIGH (ref 0.61–1.24)
GFR, Estimated: 56 mL/min — ABNORMAL LOW (ref >60)
Glucose, Bld: 111 mg/dL — ABNORMAL HIGH (ref 70–99)
Potassium: 5 mmol/L (ref 3.5–5.1)
Sodium: 134 mmol/L — ABNORMAL LOW (ref 135–145)
Total Bilirubin: 0.5 mg/dL (ref 0.3–1.2)
Total Protein: 7.4 g/dL (ref 6.5–8.1)

## 2020-10-25 LAB — CBC
HCT: 34.6 % — ABNORMAL LOW (ref 39.0–52.0)
Hemoglobin: 10.6 g/dL — ABNORMAL LOW (ref 13.0–17.0)
MCH: 28.3 pg (ref 26.0–34.0)
MCHC: 30.6 g/dL (ref 30.0–36.0)
MCV: 92.5 fL (ref 80.0–100.0)
Platelets: 467 10*3/uL — ABNORMAL HIGH (ref 150–400)
RBC: 3.74 MIL/uL — ABNORMAL LOW (ref 4.22–5.81)
RDW: 15.5 % (ref 11.5–15.5)
WBC: 18.1 10*3/uL — ABNORMAL HIGH (ref 4.0–10.5)
nRBC: 0 % (ref 0.0–0.2)

## 2020-10-25 MED ORDER — IOHEXOL 300 MG/ML  SOLN
50.0000 mL | Freq: Once | INTRAMUSCULAR | Status: AC | PRN
  Administered 2020-10-25: 20 mL

## 2020-10-25 MED ORDER — LIDOCAINE HCL (PF) 1 % IJ SOLN
INTRAMUSCULAR | Status: DC | PRN
  Administered 2020-10-25: 5 mL via SUBCUTANEOUS

## 2020-10-25 MED ORDER — POLYETHYLENE GLYCOL 3350 17 G PO PACK
17.0000 g | PACK | Freq: Two times a day (BID) | ORAL | Status: DC
  Administered 2020-10-25 – 2020-10-26 (×2): 17 g via ORAL
  Filled 2020-10-25 (×3): qty 1

## 2020-10-25 MED ORDER — IOHEXOL 300 MG/ML  SOLN
100.0000 mL | Freq: Once | INTRAMUSCULAR | Status: AC | PRN
  Administered 2020-10-25: 100 mL via INTRAVENOUS

## 2020-10-25 MED ORDER — HYDROMORPHONE HCL 1 MG/ML IJ SOLN
1.0000 mg | Freq: Once | INTRAMUSCULAR | Status: AC
Start: 2020-10-25 — End: 2020-10-25
  Administered 2020-10-25: 1 mg via INTRAVENOUS
  Filled 2020-10-25: qty 1

## 2020-10-25 MED ORDER — LIDOCAINE HCL 1 % IJ SOLN
INTRAMUSCULAR | Status: AC
  Filled 2020-10-25: qty 20

## 2020-10-25 NOTE — Progress Notes (Signed)
Patient has fever of 101.3,MD on call text paged. Awaiting for call back. Will continue to monitor.

## 2020-10-25 NOTE — Progress Notes (Signed)
FPTS Brief Progress Note  S:Andre Glass was laying down in bed awake. He said he is feeling well but had mild abdominal cramps earlier that have since gone. He report he hasn't had a bowel movement in about two days.   O: BP (!) 144/78 (BP Location: Right Arm)   Pulse 79   Temp 98.4 F (36.9 C) (Oral)   Resp 17   Ht 5\' 8"  (1.727 m)   Wt 82 kg   SpO2 99%   BMI 27.50 kg/m   General: Awake, Laying in bed, NAD Respiratory: Non-labored breathing in room air   A/P: UTI -Continue Keflex 500 mg q6h -continue Morning cbc  -monitor vitals  Diarrhea Patient blood work for C.def was negative and he reports not having bowel movement in over 2 days. Miralax 17g was ordered  -monitor Bowel movement    Andre Bleacher, MD 10/25/2020, 2:22 AM PGY-1, [place] [facility] Night Resident  Please page [phone] with questions.

## 2020-10-25 NOTE — Progress Notes (Signed)
An error was made on progress notes from 7/18, 7/19, and 7/20.  On these progress notes patient was listed as DNR.   I want to make it very clear that patient is not DNR.  Patient is full code.

## 2020-10-25 NOTE — Procedures (Signed)
Interventional Radiology Procedure:   Indications: Leaking right nephrostomy tube  Procedure: Nephrostomy tube exchange  Findings: Partially dislodged right nephrostomy tube.  New 10 Fr tube in renal pelvis.  Complications: None     EBL: None  Plan: Keep drain to gravity bag.   Limmie Schoenberg R. Anselm Pancoast, MD  Pager: [phone]

## 2020-10-25 NOTE — Progress Notes (Addendum)
Family Medicine Teaching Service Daily Progress Note Intern Pager: [phone]  Patient name: Andre Glass Medical record number: [id number] Date of birth: 11/13/1950 Age: 70 y.o. Gender: male  Primary Care Provider: [name], DO Consultants: Palliative Code Status: Full Code  Pt Overview and Major Events to Date:  7/15-admitted  Assessment and Plan: Patient is a 37-year-old male who initially presented with AMS and increased myoclonic jerking jerking.  Both have resolved.  PMH is significant for metastatic prostate cancer, symptomatic anemia, recurrent UTI, HTN, recent admission for obstructive uropathy requiring bilateral nephrostomy tubes  UTI  bacteremia Patient completed 2 days of IV ceftriaxone on 7/18 and is now on oral Keflex with an end date of 7/31 for total 14 days of antibiotics. -Continue oral Keflex 500 mg every 6 hours -AM CBC -Monitor vitals  Diarrhea Patient started having watery diarrhea and significant abdominal pain on 7/18.  Denies any blood.  C. difficile negative, GI panel negative.  WBC decreased from 19.3>17.2 on 7/21.  CBC is pending for this morning.  Patient has not had any diarrhea for the past 3 days and abdominal pain had gotten significantly better.  This is his third day with no bowel movement.  He received MiraLAX last night and this morning.  His abdominal pain has returned as of last night and morning he rates it at a 8/10 on, and a 9/10 on with palpation. -CBC ordered -MiraLAX today -KUB ordered -Consider abdominal CT  Struct of uropathy with bilateral nephrostomy tubes Patient previously had some leakage from his right nephrostomy tube.  IR evaluated this and recommended more frequent emptying bags and flushing of tubes every day but stated the leakage may occur regardless.  IR will follow up with him outpatient to schedule his ureteral stent placement and exchange of tubes.  Metastatic adenocarcinoma of the prostate, chronic cancer  patient -Continue hydromorphone 6 to 8 mg every 4 hours as needed -Continue Lyrica 50 mg twice daily  Anemia -Hemoglobin has been stable, CBC for today is pending.  Hypertension Blood pressure today is 141/81 -Continue amlodipine  BPH -Continue Flomax  FEN/GI: Regular PPx: Lovenox Dispo:Home tomorrow. Barriers include work up for abdominal pain.   Subjective:  Patient laying only his right side in bed.  He appeared uncomfortable.  Not smiling and relaxed as he typically is.  He states his abdominal pain has gotten significantly worse since last night.  He rates it at an 8/10 and a 9/10 with palpation.  He states he has not had a bowel movement in over 2 days, this will be the third day.  He does not feel like this abdominal pain is necessarily related to constipation as it feels different.  He states constipation feels like a pressure while this feels more like a diffuse pain that is worse at the left upper quadrant.   Objective: Temp:  [98 F (36.7 C)-98.7 F (37.1 C)] 98.7 F (37.1 C) (07/22 0519) Pulse Rate:  [71-79] 76 (07/22 0519) Resp:  [17-19] 19 (07/22 0519) BP: (135-146)/(78-81) 146/81 (07/22 0519) SpO2:  [98 %-99 %] 99 % (07/22 0519) Weight:  [82 kg] 82 kg (07/21 2128) Physical Exam: General: Patient lying in bed, no acute distress, appears uncomfortable Cardiovascular: RRR, normal S1/S2 Respiratory: CTAB Abdomen: Diffusely tender to palpation, worse in the left upper quadrant.  rebound tenderness present  Laboratory: Recent Labs  Lab 10/22/20 0344 10/23/20 0435 10/24/20 0433  WBC 14.7* 19.3* 17.2*  HGB 10.1* 11.2* 10.9*  HCT 31.9* 35.3* 35.0*  PLT 496* 533* 524*   Recent Labs  Lab 10/23/20 0435 10/24/20 0433 10/25/20 0328  NA 136 135 134*  K 4.3 5.1 5.0  CL 105 104 105  CO2 21* 23 22  BUN 21 25* 34*  CREATININE 1.24 1.32* 1.35*  CALCIUM 9.4 9.4 9.1  PROT 8.3* 7.7 7.4  BILITOT 0.5 0.5 0.5  ALKPHOS 139* 130* 127*  ALT 58* 53* 52*  AST 34 30 35   GLUCOSE 102* 83 111[name], DO 10/25/2020, 9:22 AM PGY-1, [place] Intern pager: [phone], text pages welcome

## 2020-10-25 NOTE — Progress Notes (Signed)
Request to IR for bilateral PCN exchange due to concern for ongoing right PCN leakage. Patient previously scheduled as an outpatient this week for attempt at antegrade stent placement and PCN exchange with moderate sedation, however this was cancelled due to his hospitalization.  Per discussions with bedside RN throughout the day no leaking noted after multiple dressing checks, dressing remained dry throughout her shift. Per discussion with Dr. Ronnald Ramp on evaluation earlier yesterday morning there was evidence of leakage on dressing but no active leaking noted. From discussion with patient and staff over the last few days appears leaking tends to occur overnight and stops during the day. Given the tube remains patent with appropriate output from both sides (right is consistently less than the left per I/O which is normal), suture and stat lock are securely in place and tube flushes without resistance, pain or leakage the PCN is likely in appropriate position and does not require further imaging at this time. The night time leakage is likely due to patient movement and positioning of the tube during sleep which can cause some urine to leak out of the insertion site with tube movement. Unfortunately it's unlikely that PCN exchange would resolve this issue. Furthermore patient would likely benefit from proceeding with antegrade stent placement and bilateral PCN exchange as previously planned rather than undergoing 2 separate procedures which would increase the risk of infection/patient discomfort.   Plan: - QD flushes of both PCNs with 10 cc NS - Dressing changes QD or PRN if soiled - Empty gravity bags Q6H or earlier if full to prevent back up of urine/leakage - May place ostomy bag over insertion site at night for patient comfort to contain any leakage - Patient to be scheduled as an outpatient for antegrade stent placement with PCN exchange. There is an order in place and IR schedulers will call patient with  date/time.   Above was discussed with Dr. Ronnald Ramp who is in agreement with plan.  IR remains available as needed, please call with questions or concerns.

## 2020-10-25 NOTE — Progress Notes (Addendum)
   Palliative Medicine Inpatient Follow Up Note  Reason for consult:  "GOC-multiple comorbidities including metastatic prostate cancer and bacteremia"   HPI:  Per intake H&P --> Andre Glass is a 70 y.o. male presenting with altered mental status and increased myoclonic jerking. PMH is significant for metastatic prostate cancer, symptomatic anemia, recurrent UTI,  hypertension, and recent admission for obstructive uropathy with bilateral hydronephrosis and placement of bilateral percutaneous nephrostomy tubes.   Patient was seen by PMT in October of 2021 for symptom management.   Today's Discussion (10/24/2020):  *Please note that this is a verbal dictation therefore any spelling or grammatical errors are due to the "[place] [facility]" system interpretation.  Chart reviewed.   I was able to touch base with patients RN, Seth Bake who shares that patient is now constipated, otherwise no great concerns.   Andre Glass shares with me that he is having an increase in abdominal discomfort today. We reviewed that now he has not recently had a bowel movement. Discussed that the primary medical team has initiated some medicine, miralax to aid in this. From a pain perspective Andre Glass reports feeling improved with the exception of his abdomen. He was able to mobilize with a FWW in his room and appeared quite steady on his feet.  Questions and concerns addressed   Objective Assessment: Vital Signs Vitals:   10/25/20 0519 10/25/20 0931  BP: (!) 146/81 (!) 141/81  Pulse: 76 79  Resp: 19 18  Temp: 98.7 F (37.1 C) 98.2 F (36.8 C)  SpO2: 99% 98%    Intake/Output Summary (Last 24 hours) at 10/25/2020 1138 Last data filed at 10/25/2020 0957 Gross per 24 hour  Intake 310 ml  Output 1725 ml  Net -1415 ml    Last Weight  Most recent update: 10/24/2020  9:42 PM    Weight  82 kg (180 lb 13.5 oz)            Gen:  Frail AA M chronically ill in appearance HEENT: Dry mucous membranes CV: Regular rate and  rhythm PULM: On RA ABD: soft/nontender EXT: No edema Neuro: Alert and oriented x3   SUMMARY OF RECOMMENDATIONS   Full Code / Full Scope of Care - Patient would like time to gather his family together to discuss goals of care   [place] - Provided Packet for family to further discuss   A MOST for was provided for review and completion - Patient will complete this after meeting with his family  Constipation - primary team added miralax back and will pursue additional abdominal imaging   Will recommend OP Palliative Care   Palliative care will continue to follow peripherally   Code Status/Advance Care Planning: FULL CODE   Time Spent: 15 Greater than 50% of the time was spent in counseling and coordination of care ______________________________________________________________________________________ [place] Team Team Cell Phone: [phone] Please utilize secure chat with additional questions, if there is no response within 30 minutes please call the above phone number  Palliative Medicine Team providers are available by phone from 7am to 7pm daily and can be reached through the team cell phone.  Should this patient require assistance outside of these hours, please call the patient's attending physician.

## 2020-10-25 NOTE — Plan of Care (Signed)
  Problem: Elimination: Goal: Will not experience complications related to bowel motility Outcome: Progressing   Problem: Pain Managment: Goal: General experience of comfort will improve Outcome: Progressing   Problem: Safety: Goal: Ability to remain free from injury will improve Outcome: Progressing   Problem: Urinary Elimination: Goal: Signs and symptoms of infection will decrease Outcome: Progressing

## 2020-10-25 NOTE — Progress Notes (Signed)
Patient given 1st bottle of contrast at 1345. Instructed to drink 1st bottle now and then 2nd bottle at 1445.  Will continue to monitor.  Aurther Loft, RN

## 2020-10-25 NOTE — Progress Notes (Signed)
Supervising Physician: Markus Daft  Patient Status:  [facility] - In-pt  Chief Complaint:  Leakage around  right PCN  Subjective:  Patient laying in bed, not in acute distress.  Family member and RN at the bedside.  Patient states that RN had to change his dressing of right drain because he was saturated with leakage. RN confirms dressing was saturated with urine colored fluid.  Allergies: Ibuprofen and Shrimp [shellfish allergy]  Medications: Prior to Admission medications   Medication Sig Start Date End Date Taking? Authorizing Provider  amLODipine (NORVASC) 10 MG tablet Take 1 tablet (10 mg total) by mouth daily. 10/05/20  Yes Rai, Ripudeep K, MD  gabapentin (NEURONTIN) 100 MG capsule Take 2 capsules (200 mg total) by mouth 2 (two) times daily. 10/05/20  Yes Rai, Ripudeep K, MD  HYDROmorphone (DILAUDID) 4 MG tablet Take 1 tablet (4 mg total) by mouth every 4 (four) hours as needed for severe pain. 09/27/20  Yes [name], MD  lidocaine-prilocaine (EMLA) cream Apply 1 application topically as needed. Patient taking differently: Apply 1 application topically as needed (pain). 01/15/20  Yes [name], MD  linaclotide Rolan Lipa) 290 MCG CAPS capsule Take 1 capsule (290 mcg total) by mouth daily as needed (constipation, take before breakfast). 10/05/20  Yes Rai, Ripudeep K, MD  morphine (MS CONTIN) 60 MG 12 hr tablet Take 1 tablet (60 mg total) by mouth every 8 (eight) hours. 09/27/20  Yes [name], MD  Multiple Vitamin (MULTIVITAMIN WITH MINERALS) TABS tablet Take 1 tablet by mouth daily.   Yes [provider]  naloxegol oxalate (MOVANTIK) 12.5 MG TABS tablet Take 1 tablet (12.5 mg total) by mouth daily. Take it in the morning 1 hour before breakfast daily for constipation. 10/05/20  Yes Rai, Ripudeep K, MD  ondansetron (ZOFRAN) 4 MG tablet Take 1 tablet (4 mg total) by mouth 3 (three) times daily as needed for nausea or vomiting. 09/24/20  Yes Shadad, Mathis Dad, MD   Plecanatide (TRULANCE) 3 MG TABS Take 3 mg by mouth daily. 08/08/20  Yes [name], [name], DO  polyethylene glycol (MIRALAX) 17 g packet Take 17 g by mouth 2 (two) times daily. Patient taking differently: Take 17 g by mouth daily as needed for mild constipation. 10/12/20  Yes Carmin Muskrat, MD  prochlorperazine (COMPAZINE) 10 MG tablet Take 1 tablet (10 mg total) by mouth every 6 (six) hours as needed for nausea or vomiting. 09/24/20  Yes Shadad, Mathis Dad, MD  senna-docusate (SENOKOT-S) 8.6-50 MG tablet Take 2 tablets by mouth daily. 10/12/20 11/11/20 Yes Carmin Muskrat, MD  vitamin C (ASCORBIC ACID) 500 MG tablet Take 1,000 mg by mouth daily.   Yes [provider]  Vitamin E 268 MG (400 UNIT) CHEW Chew 1 tablet by mouth daily.   Yes [provider]  predniSONE (DELTASONE) 5 MG tablet TAKE 1 TABLET BY MOUTH EVERY DAY WITH BREAKFAST 10/18/20   [name], MD  tamsulosin (FLOMAX) 0.4 MG CAPS capsule Take 1 capsule (0.4 mg total) by mouth daily as needed (to help with urination). 10/24/20   [name], DO     Vital Signs: BP (!) 141/81 (BP Location: Right Arm)   Pulse 79   Temp 98.2 F (36.8 C) (Oral)   Resp 18   Ht 5\' 8"  (1.727 m)   Wt 180 lb 13.5 oz (82 kg)   SpO2 98%   BMI 27.50 kg/m   Physical Exam Vitals reviewed.  Constitutional:  General: He is not in acute distress.    Appearance: Normal appearance. He is not ill-appearing.  HENT:     Head: Normocephalic and atraumatic.  Pulmonary:     Effort: Pulmonary effort is normal.  Abdominal:     General: Abdomen is flat.     Palpations: Abdomen is soft.  Musculoskeletal:     Cervical back: Neck supple.  Skin:    General: Skin is warm and dry.     Coloration: Skin is not jaundiced or pale.     Comments: Positive bilateral PCN to gravity bag. Site unremarkable with no erythema, edema, tenderness, bleeding or drainage. Suture and stat lock in place. Dressing is clean, dry, and intact.  Approximately 100 ml of  urine colored fluid noted in the bag. Drains aspirate and flush well.    Neurological:     Mental Status: He is alert and oriented to person, place, and time.  Psychiatric:        Mood and Affect: Mood normal.        Behavior: Behavior normal.        Judgment: Judgment normal.    Imaging: No results found.  Labs:  CBC: Recent Labs    10/22/20 0344 10/23/20 0435 10/24/20 0433 10/25/20 1324  WBC 14.7* 19.3* 17.2* 18.1*  HGB 10.1* 11.2* 10.9* 10.6*  HCT 31.9* 35.3* 35.0* 34.6*  PLT 496* 533* 524* 467*    COAGS: Recent Labs    10/31/19 1139  INR 1.0    BMP: Recent Labs    10/31/19 1139 11/29/19 1306 01/12/20 0852 10/22/20 0344 10/23/20 0435 10/24/20 0433 10/25/20 0328  NA 142 142   < > 137 136 135 134*  K 3.6 3.5   < > 4.3 4.3 5.1 5.0  CL 103 106   < > 105 105 104 105  CO2 32 30   < > 23 21* 23 22  GLUCOSE 86 121*   < > 90 102* 83 111*  BUN 15 16   < > 19 21 25* 34*  CALCIUM 9.2 10.1   < > 9.1 9.4 9.4 9.1  CREATININE 0.82 1.35*   < > 1.13 1.24 1.32* 1.35*  GFRNONAA >60 53*   < > >60 >60 58* 56*  GFRAA >60 >60  --   --   --   --   --    < > = values in this interval not displayed.    LIVER FUNCTION TESTS: Recent Labs    10/22/20 0344 10/23/20 0435 10/24/20 0433 10/25/20 0328  BILITOT 0.3 0.5 0.5 0.5  AST 31 34 30 35  ALT 63* 58* 53* 52*  ALKPHOS 122 139* 130* 127*  PROT 7.2 8.3* 7.7 7.4  ALBUMIN 2.0* 2.3* 2.2* 2.2*    Assessment and Plan:  70 year old male with history of metastatic prostate cancer and recurrent UTI, s/p bilateral PCN placement with IR on 09/29/20.  IR was contacted due to leakage from the right PCN. Physical exam is unremarkable, no leakage noted with flushing. Site is clean and dry, however, RN confirms that the dressing was saturated with urine colored fluid today.  Discussed with Dr. Anselm Pancoast, recommend right PCN exchange today.  VSS  WBC 18.1, Hgb 10.6, at baseline. Plt 467.   Further treatment plan per primary team.   Appreciate and agree with the plan.  IR to follow.    Electronically Signed: Tera Mater, PA-C 10/25/2020, 3:23 PM   I spent a total of 15 Minutes at the the  patient's bedside AND on the patient's hospital floor or unit, greater than 50% of which was counseling/coordinating care for leakage from right PCN.

## 2020-10-26 DIAGNOSIS — R109 Unspecified abdominal pain: Secondary | ICD-10-CM | POA: Diagnosis not present

## 2020-10-26 MED ORDER — MIRTAZAPINE 15 MG PO TABS
7.5000 mg | ORAL_TABLET | Freq: Every day | ORAL | Status: DC
  Administered 2020-10-26 – 2020-10-29 (×4): 7.5 mg via ORAL
  Filled 2020-10-26 (×4): qty 1

## 2020-10-26 NOTE — Progress Notes (Signed)
Supervising Physician: Aletta Edouard  Patient Status:  [facility] - In-pt  Chief Complaint:  Leakage around  right PCN S/p right PCN exchange with Dr. Anselm Pancoast on 7/22  Subjective:  Patient laying in bed, not in acute distress.  Family member and RN at the bedside.  Reports no issue with the exchanged right PCN over night.   Allergies: Ibuprofen and Shrimp [shellfish allergy]  Medications: Prior to Admission medications   Medication Sig Start Date End Date Taking? Authorizing Provider  amLODipine (NORVASC) 10 MG tablet Take 1 tablet (10 mg total) by mouth daily. 10/05/20  Yes Rai, Ripudeep K, MD  gabapentin (NEURONTIN) 100 MG capsule Take 2 capsules (200 mg total) by mouth 2 (two) times daily. 10/05/20  Yes Rai, Ripudeep K, MD  HYDROmorphone (DILAUDID) 4 MG tablet Take 1 tablet (4 mg total) by mouth every 4 (four) hours as needed for severe pain. 09/27/20  Yes [name], MD  lidocaine-prilocaine (EMLA) cream Apply 1 application topically as needed. Patient taking differently: Apply 1 application topically as needed (pain). 01/15/20  Yes [name], MD  linaclotide Rolan Lipa) 290 MCG CAPS capsule Take 1 capsule (290 mcg total) by mouth daily as needed (constipation, take before breakfast). 10/05/20  Yes Rai, Ripudeep K, MD  morphine (MS CONTIN) 60 MG 12 hr tablet Take 1 tablet (60 mg total) by mouth every 8 (eight) hours. 09/27/20  Yes [name], MD  Multiple Vitamin (MULTIVITAMIN WITH MINERALS) TABS tablet Take 1 tablet by mouth daily.   Yes [provider]  naloxegol oxalate (MOVANTIK) 12.5 MG TABS tablet Take 1 tablet (12.5 mg total) by mouth daily. Take it in the morning 1 hour before breakfast daily for constipation. 10/05/20  Yes Rai, Ripudeep K, MD  ondansetron (ZOFRAN) 4 MG tablet Take 1 tablet (4 mg total) by mouth 3 (three) times daily as needed for nausea or vomiting. 09/24/20  Yes Shadad, Mathis Dad, MD  Plecanatide (TRULANCE) 3 MG TABS Take 3 mg by mouth daily.  08/08/20  Yes [name], [name], DO  polyethylene glycol (MIRALAX) 17 g packet Take 17 g by mouth 2 (two) times daily. Patient taking differently: Take 17 g by mouth daily as needed for mild constipation. 10/12/20  Yes Carmin Muskrat, MD  prochlorperazine (COMPAZINE) 10 MG tablet Take 1 tablet (10 mg total) by mouth every 6 (six) hours as needed for nausea or vomiting. 09/24/20  Yes Shadad, Mathis Dad, MD  senna-docusate (SENOKOT-S) 8.6-50 MG tablet Take 2 tablets by mouth daily. 10/12/20 11/11/20 Yes Carmin Muskrat, MD  vitamin C (ASCORBIC ACID) 500 MG tablet Take 1,000 mg by mouth daily.   Yes [provider]  Vitamin E 268 MG (400 UNIT) CHEW Chew 1 tablet by mouth daily.   Yes [provider]  predniSONE (DELTASONE) 5 MG tablet TAKE 1 TABLET BY MOUTH EVERY DAY WITH BREAKFAST 10/18/20   [name], MD  tamsulosin (FLOMAX) 0.4 MG CAPS capsule Take 1 capsule (0.4 mg total) by mouth daily as needed (to help with urination). 10/24/20   [name], DO     Vital Signs: BP 125/70 (BP Location: Right Arm)   Pulse 76   Temp 98.4 F (36.9 C) (Oral)   Resp 18   Ht 5\' 8"  (1.727 m)   Wt 171 lb 11.8 oz (77.9 kg)   SpO2 100%   BMI 26.11 kg/m   Physical Exam Vitals reviewed.  Constitutional:      General: He is not in acute  distress.    Appearance: Normal appearance. He is not ill-appearing.  HENT:     Head: Normocephalic and atraumatic.  Pulmonary:     Effort: Pulmonary effort is normal.  Abdominal:     General: Abdomen is flat.     Palpations: Abdomen is soft.  Musculoskeletal:     Cervical back: Neck supple.  Skin:    General: Skin is warm and dry.     Coloration: Skin is not jaundiced or pale.     Comments: Positive bilateral PCN to gravity bag. Site unremarkable with no erythema, edema, tenderness, bleeding or drainage. Suture and stat lock in place. Dressing is clean, dry, and intact.  Approximately 20 ml of urine colored fluid noted in the bag. Drains aspirate and flush  well.    Neurological:     Mental Status: He is alert and oriented to person, place, and time.  Psychiatric:        Mood and Affect: Mood normal.        Behavior: Behavior normal.        Judgment: Judgment normal.    Imaging: CT ABDOMEN PELVIS W CONTRAST  Result Date: 10/25/2020 CLINICAL DATA:  Left lower quadrant abdominal pain. EXAM: CT ABDOMEN AND PELVIS WITH CONTRAST TECHNIQUE: Multidetector CT imaging of the abdomen and pelvis was performed using the standard protocol following bolus administration of intravenous contrast. CONTRAST:  159mL OMNIPAQUE IOHEXOL 300 MG/ML  SOLN COMPARISON:  CT 09/28/2020, PET CT 09/18/2020 reviewed FINDINGS: Lower chest: No acute airspace disease or pleural effusion. Hypoventilatory changes in the dependent lower lobes. Hepatobiliary: Leretha Dykes of the liver is not entirely included in the field of view. No focal hepatic abnormality. Gallbladder physiologically distended, no calcified stone. No biliary dilatation. Pancreas: No ductal dilatation or inflammation. Spleen: Normal in size without focal abnormality. Splenule anterior inferiorly. Adrenals/Urinary Tract: No adrenal nodule. Right nephrostomy tube coiled posteriorly in the lower kidney, this is been subsequently evaluated with nephrostogram. Mild heterogeneous right renal enhancement. Slight dilatation of the middle and upper pole calyx. Mild enhancement of the right renal pelvis. Left nephrostomy tube with pigtail coiled in the lower pole calyx. Slight prominence of the mid and upper calices, but no renal pelvis dilatation. Mild heterogeneous left renal enhancement. Partially distended urinary bladder. Diffuse bladder wall thickening with perivesicular fat stranding. Lobulated masslike density at the bladder base, which currently appear contiguous with the prostate. Stomach/Bowel: Physiologically distended stomach. Normal positioning of the duodenum and ligament of Treitz. There is no small bowel obstruction, enteric  contrast reaches the colon. No small bowel inflammation. Normal appendix. Enteric contrast reaches the descending colon. Stool in contrast distends the ascending and transverse colon. Moderate formed stool in the descending and sigmoid colon. Sigmoid colon is redundant. Vascular/Lymphatic: Aorta bi-iliac atherosclerosis. Portal vein is patent. There is no portal venous or mesenteric gas. There is retroperitoneal adenopathy, hypermetabolic on prior PET. Representative left periaortic node measures 16 mm at the level of the mid left kidney, series 3, image 34, not significantly changed from recent CT. Anterior lower parrot aortic node measures 18 mm, slightly increased in size previously measuring 14 mm by my retrospective measurement. Many of the additional retroperitoneal nodes are slightly increased in size. Multifocal pelvic adenopathy. Dominant node adjacent to the right urinary bladder measures 2.7 cm, series 3, image 70, previously 1.6 cm. There is a necrotic left external iliac node measuring 2 cm, previously 1.5 cm. Multiple additional enlarged lymph nodes are seen throughout the pelvis with slight progression. Of these nodes  demonstrate localized mass effect on the adjacent venous structures, however no discrete intraluminal filling defects are seen. Reproductive: Enlarged nodular and multilobulated prostate gland extends into the bladder base, patient with known prostate malignancy. Other: Presacral soft tissue edema. No free air or ascites. Small subcutaneous density in the left anterior abdominal wall soft tissues may be related to medication injection sites or a soft tissue nodule. Musculoskeletal: Multifocal sclerotic metastasis with pelvic and vertebral involvement, not significantly changed from prior. No pathologic fracture. IMPRESSION: 1. Metastatic locally invasive prostate cancer with progressive adenopathy in the pelvis and retroperitoneum. 2. Bilateral nephrostomy tubes in place. Slight  prominence of the renal collecting systems without frank hydronephrosis. Heterogeneous renal enhancement with slight enhancement of the right renal pelvis, recommend correlation with nephrostomy output to assess for urinary tract infection. 3. Diffuse bladder wall thickening with perivesicular fat stranding. 4. Multifocal sclerotic osseous metastasis. 5. Small subcutaneous density in the left anterior abdominal wall soft tissues may be related to medication injection site or a soft tissue nodule. Aortic Atherosclerosis (ICD10-I70.0). Electronically Signed   By: Keith Rake M.D.   On: 10/25/2020 16:52   IR NEPHROSTOMY EXCHANGE RIGHT  Result Date: 10/25/2020 INDICATION: 70 year old with prostate cancer and bilateral nephrostomy tubes. Patient has had leakage from the right nephrostomy tube. Recent CT demonstrates the right nephrostomy tube may be partially dislodged. EXAM: RIGHT NEPHROSTOMY TUBE EXCHANGE WITH FLUOROSCOPY Physician: Stephan Minister. Anselm Pancoast, MD COMPARISON:  CT abdomen 10/25/2020 MEDICATIONS: 1% lidocaine, local anesthetic ANESTHESIA/SEDATION: None CONTRAST:  28mL OMNIPAQUE IOHEXOL 300 MG/ML SOLN - administered into the collecting system(s) FLUOROSCOPY TIME:  Fluoroscopy Time: 1 minutes, 54 seconds, 10 mGy COMPLICATIONS: None immediate. PROCEDURE: The procedure was explained to the patient. The risks and benefits of the procedure were discussed and the patient's questions were addressed. Informed consent was obtained from the patient. Patient was placed prone. The right nephrostomy tube and surrounding skin were prepped and draped in sterile fashion. Drain was injected with a small amount of contrast. The catheter retention suture was cut. The catheter was cut and removed over a superstiff Amplatz wire. A Bettey Mare catheter was advanced over the wire and positioned in the renal pelvis. Pigtail was felt to be too small for the collecting system and at risk for dislodgement again. Therefore, the  catheter was switched out for a 10 [place] multipurpose drain which was reconstituted in the renal pelvis. Skin was anesthetized with 1% lidocaine and the catheter was sutured to skin. Catheter was attached to a gravity bag. Fluoroscopic images were taken and saved for this procedure. FINDINGS: The old nephrostomy tube was near the lower pole calyx and partially dislodged. New 10 French tube is well positioned in the renal pelvis. IMPRESSION: Successful exchange of the right percutaneous nephrostomy tube. Electronically Signed   By: Markus Daft M.D.   On: 10/25/2020 17:26    Labs:  CBC: Recent Labs    10/22/20 0344 10/23/20 0435 10/24/20 0433 10/25/20 1324  WBC 14.7* 19.3* 17.2* 18.1*  HGB 10.1* 11.2* 10.9* 10.6*  HCT 31.9* 35.3* 35.0* 34.6*  PLT 496* 533* 524* 467*     COAGS: Recent Labs    10/31/19 1139  INR 1.0     BMP: Recent Labs    10/31/19 1139 11/29/19 1306 01/12/20 0852 10/22/20 0344 10/23/20 0435 10/24/20 0433 10/25/20 0328  NA 142 142   < > 137 136 135 134*  K 3.6 3.5   < > 4.3 4.3 5.1 5.0  CL 103 106   < >  105 105 104 105  CO2 32 30   < > 23 21* 23 22  GLUCOSE 86 121*   < > 90 102* 83 111*  BUN 15 16   < > 19 21 25* 34*  CALCIUM 9.2 10.1   < > 9.1 9.4 9.4 9.1  CREATININE 0.82 1.35*   < > 1.13 1.24 1.32* 1.35*  GFRNONAA >60 53*   < > >60 >60 58* 56*  GFRAA >60 >60  --   --   --   --   --    < > = values in this interval not displayed.     LIVER FUNCTION TESTS: Recent Labs    10/22/20 0344 10/23/20 0435 10/24/20 0433 10/25/20 0328  BILITOT 0.3 0.5 0.5 0.5  AST 31 34 30 35  ALT 63* 58* 53* 52*  ALKPHOS 122 139* 130* 127*  PROT 7.2 8.3* 7.7 7.4  ALBUMIN 2.0* 2.3* 2.2* 2.2*     Assessment and Plan:  70 year old male with history of metastatic prostate cancer and recurrent UTI, s/p bilateral PCN placement with IR on 09/29/20 and right PCN exchange with dr. Anselm Pancoast on 7/22  Right PCN stable, no leakage per patient and on physical exam.  Flushes  and aspirates well, OP clear urine.   R PCN OP 250 cc overnight.  Further treatment plan per primary team.  Appreciate and agree with the plan.  Please call IR for questions and concerns.    Electronically Signed: Tera Mater, PA-C 10/26/2020, 12:47 PM   I spent a total of 15 Minutes at the the patient's bedside AND on the patient's hospital floor or unit, greater than 50% of which was counseling/coordinating care for leakage from right PCN.

## 2020-10-26 NOTE — Progress Notes (Signed)
PT Cancellation Note  Patient Details Name: Andre Glass MRN: [id number] DOB: 07/10/1950   Cancelled Treatment:    Reason Eval/Treat Not Completed: Fatigue/lethargy limiting ability to participate. Patient refusing as he reports he is feeling too weak. Will re-attempt PT at later time/day.    [name] 10/26/2020, 3:04 PM

## 2020-10-26 NOTE — Plan of Care (Signed)
  Problem: Elimination: Goal: Will not experience complications related to bowel motility Outcome: Progressing   

## 2020-10-26 NOTE — Progress Notes (Addendum)
Family Medicine Teaching Service Daily Progress Note Intern Pager: [phone]  Patient name: Andre Glass Medical record number: [id number] Date of birth: 1950/07/29 Age: 70 y.o. Gender: male  Primary Care Provider: [name], DO Consultants: Palliative, IR Code Status: Full code  Pt Overview and Major Events to Date:  7/15-admitted  Assessment and Plan: Patient is a 70 year old male who initially presented with AMS and increased myoclonic jerking both have resolved.  He was found to have a UTI and Klebsiella bacteremia.  PMH is significant for metastatic prostate cancer, symptomatic anemia, and recurrent UTI, hypertension, recent admission for obstructive uropathy requiring bilateral nephrostomy tubes  Abdominal pain Patient had watery diarrhea accompanied by increased WBC starting on 7/18 which cleared within 2 days.  This was followed by 3 days of constipation and severe abdominal pain.  A CT scan was ordered on 7/22 which showed several mets which were present on CT from 3/11 but have increased in size. He developed a fever of 101.3 last night which resolved naturally.  WBC is still increased at 18.1.  He had several bouts of diarrhea yesterday evening and this morning. -Holding laxatives  Obstructive uropathy with bilateral nephrostomy tubes Patient had leakage of right nephrostomy tube.  The tube was exchanged on 7/22.  Patient is scheduled for a ureteral stent placements on 7/26  UTI  bacteremia Patient completed 2 days of ICD ceftriaxone on 7/18 and is now on oral Keflex with an end date of 7/31 for total of 14 days of antibiotics -Continue oral Keflex 500 mg every 6 hours.  Had a fever of 101.3 last night which has resolved naturally.  WBC is still increased at 18.1 -AM CBC -Monitor vitals  Metastatic adenocarcinoma of the prostate  chronic cancer patient Appetite has decreased and patient would like to try an appetite stimulant.  We will try mirtazapine -Continue  hydromorphone 6 to 8 mg every 4 hours as needed -Continue Lyrica 50 mg twice daily -Mirtazapine 7.5 mg daily at bedtime  Anemia Hemoglobin is stable at 10.6 today -Daily CBC  Hypertension Blood pressure today is 125/72 -Continue amlodipine  BPH -Continue Flomax  FEN/GI: Regular PPx: Lovenox Dispo:Home in 2-3 days. Barriers include further work-up for medical management.   Subjective:  Patient was lying supine in bed this morning.  Appears to be uncomfortable and states his abdominal pain is still present but rates it at a 6/10 which is slightly better than yesterday.  He states he had 3 bouts of diarrhea last night this morning he does not have much of an appetite but does not feel nauseous.   Objective: Temp:  [98.2 F (36.8 C)-101.3 F (38.5 C)] 98.3 F (36.8 C) (07/23 0453) Pulse Rate:  [76-92] 87 (07/23 0453) Resp:  [17-20] 17 (07/23 0453) BP: (125-141)/(72-81) 125/72 (07/23 0453) SpO2:  [98 %-100 %] 99 % (07/23 0453) Weight:  [77.9 kg] 77.9 kg (07/22 2247) Physical Exam: General: Patient lying supine in bed, appears uncomfortable, no acute distress Cardiovascular: RRR, normal S1/S2, no murmurs Respiratory: CTA bilaterally Abdomen: Soft, nondistended, diffuse tenderness to palpation worse in the left upper and lower quadrants Extremities: No edema  Laboratory: Recent Labs  Lab 10/23/20 0435 10/24/20 0433 10/25/20 1324  WBC 19.3* 17.2* 18.1*  HGB 11.2* 10.9* 10.6*  HCT 35.3* 35.0* 34.6*  PLT 533* 524* 467*   Recent Labs  Lab 10/23/20 0435 10/24/20 0433 10/25/20 0328  NA 136 135 134*  K 4.3 5.1 5.0  CL 105 104 105  CO2 21* 23  22  BUN 21 25* 34*  CREATININE 1.24 1.32* 1.35*  CALCIUM 9.4 9.4 9.1  PROT 8.3* 7.7 7.4  BILITOT 0.5 0.5 0.5  ALKPHOS 139* 130* 127*  ALT 58* 53* 52*  AST 34 30 35  GLUCOSE 102* 83 111[name], DO 10/26/2020, 7:26 AM PGY-1, [place] Intern pager: [phone], text pages welcome

## 2020-10-27 ENCOUNTER — Inpatient Hospital Stay (HOSPITAL_COMMUNITY): Payer: Medicare Other

## 2020-10-27 DIAGNOSIS — R109 Unspecified abdominal pain: Secondary | ICD-10-CM | POA: Diagnosis not present

## 2020-10-27 DIAGNOSIS — B961 Klebsiella pneumoniae [K. pneumoniae] as the cause of diseases classified elsewhere: Secondary | ICD-10-CM | POA: Diagnosis not present

## 2020-10-27 DIAGNOSIS — R7881 Bacteremia: Secondary | ICD-10-CM | POA: Diagnosis not present

## 2020-10-27 LAB — CBC
HCT: 32.2 % — ABNORMAL LOW (ref 39.0–52.0)
Hemoglobin: 10.2 g/dL — ABNORMAL LOW (ref 13.0–17.0)
MCH: 28.8 pg (ref 26.0–34.0)
MCHC: 31.7 g/dL (ref 30.0–36.0)
MCV: 91 fL (ref 80.0–100.0)
Platelets: 433 10*3/uL — ABNORMAL HIGH (ref 150–400)
RBC: 3.54 MIL/uL — ABNORMAL LOW (ref 4.22–5.81)
RDW: 15.5 % (ref 11.5–15.5)
WBC: 19.6 10*3/uL — ABNORMAL HIGH (ref 4.0–10.5)
nRBC: 0 % (ref 0.0–0.2)

## 2020-10-27 LAB — URINE CULTURE: Culture: NO GROWTH

## 2020-10-27 MED ORDER — ACETAMINOPHEN 500 MG PO TABS
500.0000 mg | ORAL_TABLET | Freq: Once | ORAL | Status: AC
  Administered 2020-10-27: 500 mg via ORAL
  Filled 2020-10-27: qty 1

## 2020-10-27 MED ORDER — HYDROMORPHONE HCL 2 MG PO TABS
6.0000 mg | ORAL_TABLET | ORAL | Status: AC
  Administered 2020-10-27 – 2020-10-28 (×2): 6 mg via ORAL
  Administered 2020-10-28: 8 mg via ORAL
  Filled 2020-10-27: qty 3
  Filled 2020-10-27: qty 4
  Filled 2020-10-27: qty 3

## 2020-10-27 NOTE — Progress Notes (Signed)
FPTS Brief Progress Note  S:Pt was resting in bed.  States he is still having diffuse abdominal pain and a headache.  Tylenol helps with the headache but nothing seems to help with the abdominal pain.  He is still having diarrhea and was waiting for a nurse to come help clean him  as he couldn't make it to the commode.    O: BP 140/76 (BP Location: Left Arm)   Pulse 81   Temp 98.2 F (36.8 C) (Oral)   Resp 18   Ht 5\' 8"  (1.727 m)   Wt 77.9 kg   SpO2 100%   BMI 26.11 kg/m   General: Pt lying in bed, NAD Respiratory: breathing comfortably on RA Neuro: alert and oriented  A/P: Diarrhea Pt is still having diarrhea.   -hold laxatives  Headache -tylenol PRN  Abdominal pain Most likely due to metastatic cancer.   -Changed night time dilaudid 6-8 mg q4h to scheduled for 3 doses   - Orders reviewed. Labs for AM ordered, which was adjusted as needed.    [name], DO 10/27/2020, 8:24 PM PGY-1, [facility] Night Resident  Please page [phone] with questions.

## 2020-10-27 NOTE — Progress Notes (Addendum)
FPTS Interim Night Progress Note  S:Patient resting comfortably. Complains of headache.  Has has it all day.  Dilaudid has not helped.  Denies any visual changes, slurred speech, weakness, numbness or tingling. Rounded with primary night RN.  No concerns voiced.  No orders required.    O: Today's Vitals   10/27/20 0030 10/27/20 0042 10/27/20 0112 10/27/20 0212  BP:  137/70    Pulse:  80    Resp:  18    Temp:  98 F (36.7 C)    TempSrc:  Oral    SpO2:  97%    Weight:      Height:      PainSc: 5   5  Asleep    Physical Exam:  General: 70 y.o. male in NAD Neuro: CN II: PERRL CN III, IV,VI: EOMI CV V: Normal sensation in V1, V2, V3 CVII: Symmetric smile and brow raise CN VIII: Normal hearing CN IX,X: Symmetric palate raise  CN XI: 5/5 shoulder shrug CN XII: Symmetric tongue protrusion  UE and LE strength 5/5 Normal sensation in UE and LE bilaterally  No ataxia with finger to nose, normal heel to shin  Negative Rhomberg    A/P: Headache  Likely secondary to opioid use.  Low suspicion for acute CVA or brain lesions given no focal deficits on exam and most recent CT head negative for lesions. Tylenol 500 mg x1 Continue to monitor   Carollee Leitz MD PGY-3, [facility] Medicine Service pager [phone]

## 2020-10-27 NOTE — Consult Note (Signed)
[facility] for Infectious Disease       Reason for Consult:fever    Referring Physician: Dr. Ardelia Mems  Active Problems:   Symptomatic anemia   Altered mental status   Myoclonus   Anemia   Bacteremia due to Klebsiella pneumoniae   Acute cystitis without hematuria   Severe protein-calorie malnutrition (HCC)   Abdominal pain    amLODipine  10 mg Oral Daily   cephALEXin  500 mg Oral Q6H   Chlorhexidine Gluconate Cloth  6 each Topical Daily   enoxaparin (LOVENOX) injection  40 mg Subcutaneous Q24H   feeding supplement  237 mL Oral TID BM   mirtazapine  7.5 mg Oral QHS   multivitamin with minerals  1 tablet Oral Daily   predniSONE  5 mg Oral Q breakfast   pregabalin  50 mg Oral BID    Recommendations:  Continues cephalexin  Assessment: He has a fever both on admission and now again up to 101.3 now but is otherwise having no new symptoms.  His initial presentation with Klebsiella bacteremia did improve with antibiotics but new fever again is of unknown etiology.  Certain his prostate cancer can cause tumor fever or potentially some transient seeding when he had his nephrostomy tubes changed.  At this point, he looks well so I do not feel he needs any changes to his antibiotics.   Will continue to monitor.  Dr. Gale Journey will folllow up tomorrow  Antibiotics: Ceftriaxone > cephalexin  HPI: Andre Glass is a 70 y.o. male with a history of prostate cancer, nephrostomy tube placement, HTN who came initially on 7/15 with jerking movements of his muscles and altered mental status.  He was febrile to 103, had leukocytosis of 16.9 and UA with a small amount of leukocytes of 15-20 and was associated with nitrites and a blood culture that grew Klebsiella pneumoniae in both sets, 3/3 bottles.  He was started on ceftriaxone 7/15 and transitioned to oral cephalexin on 7/18 and plan for 14 days due to the presence of the nephrostomy tubes.  He has advanced prostate cancer with lymphadenopathy  and disease to the bone and CT on 7/22 significant for progressive adenopathy in the pelvis and retroperitoneum.  He had been afebrile but developed a fever on 7/22 and again overnight.  He has no new symptoms including no cough, shortness of breath.  Some diarrhea and negative for C diff.  Blood cultures repeated and ngtd from yesterday.  WBC has fluctuated between 12 and 19,000.     Review of Systems:  Constitutional: positive for fevers or negative for chills and sweats Integument/breast: negative for rash Musculoskeletal: negative for myalgias and arthralgias All other systems reviewed and are negative    Past Medical History:  Diagnosis Date   Anemia    History of kidney stones    Prostate cancer ([facility])    UTI (urinary tract infection)     Social History   Tobacco Use   Smoking status: Never   Smokeless tobacco: Never  Vaping Use   Vaping Use: Never used  Substance Use Topics   Alcohol use: Never   Drug use: Never    Family History  Problem Relation Age of Onset   Stomach cancer Mother    Pancreatic cancer Sister    Breast cancer Neg Hx    Prostate cancer Neg Hx    Colon cancer Neg Hx     Allergies  Allergen Reactions   Ibuprofen Anaphylaxis   Shrimp [Shellfish Allergy]  Anaphylaxis    Physical Exam: Constitutional: in no apparent distress  Vitals:   10/27/20 0522 10/27/20 0958  BP: (!) 145/77 139/72  Pulse: 70 78  Resp: 18 18  Temp: (!) 100.9 F (38.3 C) 97.7 F (36.5 C)  SpO2: 95% 100%   EYES: anicteric Cardiovascular: Cor RRR Respiratory: clear to auscultation bilateral GI: soft Musculoskeletal: no edema Skin: no rash Neuro: noon-focal  Lab Results  Component Value Date   WBC 19.6 (H) 10/27/2020   HGB 10.2 (L) 10/27/2020   HCT 32.2 (L) 10/27/2020   MCV 91.0 10/27/2020   PLT 433 (H) 10/27/2020    Lab Results  Component Value Date   CREATININE 1.35 (H) 10/25/2020   BUN 34 (H) 10/25/2020   NA 134 (L) 10/25/2020   K 5.0 10/25/2020   CL  105 10/25/2020   CO2 22 10/25/2020    Lab Results  Component Value Date   ALT 52 (H) 10/25/2020   AST 35 10/25/2020   ALKPHOS 127 (H) 10/25/2020     Microbiology: Recent Results (from the past 240 hour(s))  Resp Panel by RT-PCR (Flu A&B, Covid) Nasopharyngeal Swab     Status: None   Collection Time: 10/17/20  8:42 PM   Specimen: Nasopharyngeal Swab; Nasopharyngeal(NP) swabs in vial transport medium  Result Value Ref Range Status   SARS Coronavirus 2 by RT PCR NEGATIVE NEGATIVE Final    Comment: (NOTE) SARS-CoV-2 target nucleic acids are NOT DETECTED.  The SARS-CoV-2 RNA is generally detectable in upper respiratory specimens during the acute phase of infection. The lowest concentration of SARS-CoV-2 viral copies this assay can detect is 138 copies/mL. A negative result does not preclude SARS-Cov-2 infection and should not be used as the sole basis for treatment or other patient management decisions. A negative result may occur with  improper specimen collection/handling, submission of specimen other than nasopharyngeal swab, presence of viral mutation(s) within the areas targeted by this assay, and inadequate number of viral copies(<138 copies/mL). A negative result must be combined with clinical observations, patient history, and epidemiological information. The expected result is Negative.  Fact Sheet for Patients:  EntrepreneurPulse.com.au  Fact Sheet for Healthcare Providers:  IncredibleEmployment.be  This test is no t yet approved or cleared by the [place] FDA and  has been authorized for detection and/or diagnosis of SARS-CoV-2 by FDA under an Emergency Use Authorization (EUA). This EUA will remain  in effect (meaning this test can be used) for the duration of the COVID-19 declaration under Section 564(b)(1) of the Act, 21 U.S.C.section 360bbb-3(b)(1), unless the authorization is terminated  or revoked sooner.        Influenza A by PCR NEGATIVE NEGATIVE Final   Influenza B by PCR NEGATIVE NEGATIVE Final    Comment: (NOTE) The Xpert Xpress SARS-CoV-2/FLU/RSV plus assay is intended as an aid in the diagnosis of influenza from Nasopharyngeal swab specimens and should not be used as a sole basis for treatment. Nasal washings and aspirates are unacceptable for Xpert Xpress SARS-CoV-2/FLU/RSV testing.  Fact Sheet for Patients: EntrepreneurPulse.com.au  Fact Sheet for Healthcare Providers: IncredibleEmployment.be  This test is not yet approved or cleared by the [place] FDA and has been authorized for detection and/or diagnosis of SARS-CoV-2 by FDA under an Emergency Use Authorization (EUA). This EUA will remain in effect (meaning this test can be used) for the duration of the COVID-19 declaration under Section 564(b)(1) of the Act, 21 U.S.C. section 360bbb-3(b)(1), unless the authorization is terminated or revoked.  Performed at  [facility], [place] [address] [place], [place] [address]   Urine Culture     Status: Abnormal   Collection Time: 10/17/20 11:22 PM   Specimen: Urine, Catheterized  Result Value Ref Range Status   Specimen Description   Final    URINE, CATHETERIZED Performed at [facility] [address] [place], [place] [address]    Special Requests   Final    NONE Performed at [facility], [place] [address], [place], [place] [address]    Culture MULTIPLE SPECIES PRESENT, SUGGEST RECOLLECTION (A)  Final   Report Status 10/19/2020 FINAL  Final  Culture, blood (routine x 2)     Status: Abnormal   Collection Time: 10/18/20  6:15 AM   Specimen: BLOOD  Result Value Ref Range Status   Specimen Description BLOOD RIGHT ANTECUBITAL  Final   Special Requests   Final    BOTTLES DRAWN AEROBIC ONLY Blood Culture adequate volume   Culture  Setup Time   Final    GRAM NEGATIVE RODS AEROBIC BOTTLE  ONLY CRITICAL VALUE NOTED.  VALUE IS CONSISTENT WITH PREVIOUSLY REPORTED AND CALLED VALUE.    Culture (A)  Final    KLEBSIELLA PNEUMONIAE SUSCEPTIBILITIES PERFORMED ON PREVIOUS CULTURE WITHIN THE LAST 5 DAYS. Performed at [facility], [place] [address], [place], [place] [address]    Report Status 10/20/2020 FINAL  Final  Culture, blood (routine x 2)     Status: Abnormal   Collection Time: 10/18/20  6:24 AM   Specimen: BLOOD LEFT HAND  Result Value Ref Range Status   Specimen Description BLOOD LEFT HAND  Final   Special Requests   Final    BOTTLES DRAWN AEROBIC AND ANAEROBIC Blood Culture results may not be optimal due to an inadequate volume of blood received in culture bottles   Culture  Setup Time   Final    GRAM NEGATIVE RODS IN BOTH AEROBIC AND ANAEROBIC BOTTLES CRITICAL RESULT CALLED TO, READ BACK BY AND VERIFIED WITH: K. AMEND PHARMD, AT 8469 10/19/20 [place] Performed at [facility], [place] [address] [place], [place] [address]    Culture KLEBSIELLA PNEUMONIAE (A)  Final   Report Status 10/23/2020 FINAL  Final   Organism ID, Bacteria KLEBSIELLA PNEUMONIAE  Final      Susceptibility   Klebsiella pneumoniae - MIC*    AMPICILLIN RESISTANT Resistant     CEFAZOLIN <=4 SENSITIVE Sensitive     CEFEPIME <=0.12 SENSITIVE Sensitive     CEFTAZIDIME <=1 SENSITIVE Sensitive     CEFTRIAXONE <=0.25 SENSITIVE Sensitive     CIPROFLOXACIN <=0.25 SENSITIVE Sensitive     GENTAMICIN <=1 SENSITIVE Sensitive     IMIPENEM <=0.25 SENSITIVE Sensitive     TRIMETH/SULFA <=20 SENSITIVE Sensitive     AMPICILLIN/SULBACTAM <=2 SENSITIVE Sensitive     PIP/TAZO <=4 SENSITIVE Sensitive     * KLEBSIELLA PNEUMONIAE  Blood Culture ID Panel (Reflexed)     Status: Abnormal   Collection Time: 10/18/20  6:24 AM  Result Value Ref Range Status   Enterococcus faecalis NOT DETECTED NOT DETECTED Final   Enterococcus Faecium NOT DETECTED NOT DETECTED Final   Listeria monocytogenes NOT DETECTED NOT  DETECTED Final   Staphylococcus species NOT DETECTED NOT DETECTED Final   Staphylococcus aureus (BCID) NOT DETECTED NOT DETECTED Final   Staphylococcus epidermidis NOT DETECTED NOT DETECTED Final   Staphylococcus lugdunensis NOT DETECTED NOT DETECTED Final   Streptococcus species NOT DETECTED NOT DETECTED Final  Streptococcus agalactiae NOT DETECTED NOT DETECTED Final   Streptococcus pneumoniae NOT DETECTED NOT DETECTED Final   Streptococcus pyogenes NOT DETECTED NOT DETECTED Final   A.calcoaceticus-baumannii NOT DETECTED NOT DETECTED Final   Bacteroides fragilis NOT DETECTED NOT DETECTED Final   Enterobacterales DETECTED (A) NOT DETECTED Final    Comment: Enterobacterales represent a large order of gram negative bacteria, not a single organism. CRITICAL RESULT CALLED TO, READ BACK BY AND VERIFIED WITH: K. AMEND PHARMD, AT 0407 10/19/20 D. VANHOOK    Enterobacter cloacae complex NOT DETECTED NOT DETECTED Final   Escherichia coli NOT DETECTED NOT DETECTED Final   Klebsiella aerogenes NOT DETECTED NOT DETECTED Final   Klebsiella oxytoca NOT DETECTED NOT DETECTED Final   Klebsiella pneumoniae DETECTED (A) NOT DETECTED Final    Comment: CRITICAL RESULT CALLED TO, READ BACK BY AND VERIFIED WITH: K. AMEND PHARMD, AT 6301 10/19/20 D. VANHOOK    Proteus species NOT DETECTED NOT DETECTED Final   Salmonella species NOT DETECTED NOT DETECTED Final   Serratia marcescens NOT DETECTED NOT DETECTED Final   Haemophilus influenzae NOT DETECTED NOT DETECTED Final   Neisseria meningitidis NOT DETECTED NOT DETECTED Final   Pseudomonas aeruginosa NOT DETECTED NOT DETECTED Final   Stenotrophomonas maltophilia NOT DETECTED NOT DETECTED Final   Candida albicans NOT DETECTED NOT DETECTED Final   Candida auris NOT DETECTED NOT DETECTED Final   Candida glabrata NOT DETECTED NOT DETECTED Final   Candida krusei NOT DETECTED NOT DETECTED Final   Candida parapsilosis NOT DETECTED NOT DETECTED Final   Candida  tropicalis NOT DETECTED NOT DETECTED Final   Cryptococcus neoformans/gattii NOT DETECTED NOT DETECTED Final   CTX-M ESBL NOT DETECTED NOT DETECTED Final   Carbapenem resistance IMP NOT DETECTED NOT DETECTED Final   Carbapenem resistance KPC NOT DETECTED NOT DETECTED Final   Carbapenem resistance NDM NOT DETECTED NOT DETECTED Final   Carbapenem resist OXA 48 LIKE NOT DETECTED NOT DETECTED Final   Carbapenem resistance VIM NOT DETECTED NOT DETECTED Final    Comment: Performed at [facility], [address], [place], [place] [address]  C Difficile Quick Screen (NO PCR Reflex)     Status: None   Collection Time: 10/22/20  8:58 AM   Specimen: STOOL  Result Value Ref Range Status   C Diff antigen NEGATIVE NEGATIVE Final   C Diff toxin NEGATIVE NEGATIVE Final   C Diff interpretation No C. difficile detected.  Final    Comment: Performed at [facility], [place] [address] [place], [place] [address]  Gastrointestinal Panel by PCR , Stool     Status: None   Collection Time: 10/22/20  8:58 AM   Specimen: Stool  Result Value Ref Range Status   Campylobacter species NOT DETECTED NOT DETECTED Final   Plesimonas shigelloides NOT DETECTED NOT DETECTED Final   Salmonella species NOT DETECTED NOT DETECTED Final   Yersinia enterocolitica NOT DETECTED NOT DETECTED Final   Vibrio species NOT DETECTED NOT DETECTED Final   Vibrio cholerae NOT DETECTED NOT DETECTED Final   Enteroaggregative E coli (EAEC) NOT DETECTED NOT DETECTED Final   Enteropathogenic E coli (EPEC) NOT DETECTED NOT DETECTED Final   Enterotoxigenic E coli (ETEC) NOT DETECTED NOT DETECTED Final   Shiga like toxin producing E coli (STEC) NOT DETECTED NOT DETECTED Final   Shigella/Enteroinvasive E coli (EIEC) NOT DETECTED NOT DETECTED Final   Cryptosporidium NOT DETECTED NOT DETECTED Final   Cyclospora cayetanensis NOT DETECTED NOT DETECTED Final   Entamoeba histolytica  NOT DETECTED NOT DETECTED Final   Giardia lamblia NOT  DETECTED NOT DETECTED Final   Adenovirus F40/41 NOT DETECTED NOT DETECTED Final   Astrovirus NOT DETECTED NOT DETECTED Final   Norovirus GI/GII NOT DETECTED NOT DETECTED Final   Rotavirus A NOT DETECTED NOT DETECTED Final   Sapovirus (I, II, IV, and V) NOT DETECTED NOT DETECTED Final    Comment: Performed at [facility], [address], [place], [place] [address]  Urine Culture     Status: None   Collection Time: 10/26/20  1:02 PM   Specimen: Urine, Catheterized  Result Value Ref Range Status   Specimen Description URINE, CATHETERIZED  Final   Special Requests Immunocompromised  Final   Culture   Final    NO GROWTH Performed at [facility], [address], [place], [place] [address]    Report Status 10/27/2020 FINAL  Final  Culture, blood (routine x 2)     Status: None (Preliminary result)   Collection Time: 10/26/20  1:13 PM   Specimen: BLOOD  Result Value Ref Range Status   Specimen Description BLOOD RIGHT ANTECUBITAL  Final   Special Requests   Final    BOTTLES DRAWN AEROBIC AND ANAEROBIC Blood Culture adequate volume   Culture   Final    NO GROWTH < 24 HOURS Performed at [facility], [place] [address], [place], [place] [address]    Report Status PENDING  Incomplete  Culture, blood (routine x 2)     Status: None (Preliminary result)   Collection Time: 10/26/20  1:14 PM   Specimen: BLOOD LEFT HAND  Result Value Ref Range Status   Specimen Description BLOOD LEFT HAND  Final   Special Requests   Final    BOTTLES DRAWN AEROBIC AND ANAEROBIC Blood Culture results may not be optimal due to an inadequate volume of blood received in culture bottles   Culture   Final    NO GROWTH < 24 HOURS Performed at [facility], [place] [address], [place], [place] [address]    Report Status PENDING  Incomplete    Thayer Headings, [facility] for Infectious Disease [place] [email or web address] 10/27/2020, 4:28 PM

## 2020-10-27 NOTE — Progress Notes (Signed)
Family Medicine Teaching Service Daily Progress Note Intern Pager: [phone]  Patient name: Andre Glass Medical record number: [id number] Date of birth: 1950-07-10 Age: 70 y.o. Gender: male  Primary Care Provider: [name], DO Consultants: Palliative, IR, neurology (signed off) Code Status: Full code  Pt Overview and Major Events to Date:  7/15-admitted  Assessment and Plan: Patient is a 70 year old male who initially presented with AMS and increased myoclonic jerking both have resolved.  He was found to have a UTI and Klebsiella bacteremia.  PMH is significant for metastatic prostate cancer, symptomatic anemia, and recurrent UTI, hypertension, recent admission for obstructive uropathy requiring bilateral nephrostomy tubes   Abdominal discomfort/diarrhea/ongoing fever: Patient endorses 5 bouts of watery stool over the past 24 hours.  Earlier he had a 1-2-day period of constipation.  He had a negative C. difficile and a negative GI pathogen panel; I feel his diarrhea may be due to mild opioid withdrawal as he is on a lower dose of narcotics here than in the outpatient setting, however he does not have other associated symptoms.  His abdominal discomfort has improved.  Patient with a fever this morning of 100.9, also had a fever of 101.3 on 7/22 at around 11 PM.  Unsure of the etiology of his fever (see below for more information) currently being treated for UTI/Klebsiella bacteremia.  Continues on oral Dilaudid 6 to 8 mg every 4 hours as needed for pain. -Continue Keflex for UTI/bacteremia as below -Continue home Dilaudid -With new fever will check chest x-ray, if negative will call ID for additional recommendations   UTI/bacteremia/no fever Currently on oral Keflex, 14 total days of antibiotics which should end on 7/31 which includes 4 days of ceftriaxone prior to initiating Keflex.  Patient's first 2 days of ceftriaxone were 1 g every 24 hour which was increased to 2 g every 24 hours on  7/17 and 7/18.  Initiate Keflex on 7/19 after sensitivities returned.  Patient did have a fever this morning of 100.36F.  Also had a fever at around 11 PM on 7/22.  WBC 19.6. -AM CBC -Monitor vitals -Continue Keflex  Metastatic adenocarcinoma of the prostate/chronic cancer pain Pain control with oral Dilaudid 6 to 8 mg every 4 hours as needed. -Continue Dilaudid 6 to 8 mg every 4 hours as needed -Continue Lyrica 50 mg twice daily -Continue mirtazapine 7.5 mg at bedtime  Obstructive uropathy with bilateral nephrostomy tubes Tube exchanged on 7/22.  Patient with 2200 cc urine output over the last 24 hours.  Anemia Hemoglobin today of 10.2.  Stable. -Continue daily CBC  Hypertension BP of 145/77. -Continue amlodipine  FEN/GI: Regular diet PPx: Lovenox Dispo:Home tomorrow. Barriers include ongoing medical work-up.   Subjective:  Patient states he feels somewhat better today than yesterday.  States he had 5 bouts of watery stool over the last 24 hours.  He denies any shortness of breath, cough, worsening pain, or other symptoms to explain his new fever.  He states his abdominal discomfort has somewhat improved.  Objective: Temp:  [97.9 F (36.6 C)-100.9 F (38.3 C)] 100.9 F (38.3 C) (07/24 0522) Pulse Rate:  [70-80] 70 (07/24 0522) Resp:  [18-19] 18 (07/24 0522) BP: (125-145)/(67-77) 145/77 (07/24 0522) SpO2:  [95 %-100 %] 95 % (07/24 0522) Physical Exam: General: Alert and oriented in no apparent distress Heart/chest: Regular rate and rhythm with no murmurs appreciated, port present in right upper chest. Lungs: CTA bilaterally Abdomen: Bowel sounds present, mild abdominal discomfort to palpation Skin: Warm and  dry   Laboratory: Recent Labs  Lab 10/24/20 0433 10/25/20 1324 10/27/20 0112  WBC 17.2* 18.1* 19.6*  HGB 10.9* 10.6* 10.2*  HCT 35.0* 34.6* 32.2*  PLT 524* 467* 433*   Recent Labs  Lab 10/23/20 0435 10/24/20 0433 10/25/20 0328  NA 136 135 134*  K 4.3  5.1 5.0  CL 105 104 105  CO2 21* 23 22  BUN 21 25* 34*  CREATININE 1.24 1.32* 1.35*  CALCIUM 9.4 9.4 9.1  PROT 8.3* 7.7 7.4  BILITOT 0.5 0.5 0.5  ALKPHOS 139* 130* 127*  ALT 58* 53* 52*  AST 34 30 35  GLUCOSE 102* 83 111*     Kamyia Thomason, DO 10/27/2020, 8:29 AM PGY-3, [facility] Intern pager: [phone], text pages welcome

## 2020-10-27 NOTE — Progress Notes (Signed)
Patient has temperature 100.9 this morning.Dr.Walsh notified. No new orders received.

## 2020-10-28 ENCOUNTER — Ambulatory Visit: Payer: Medicare Other | Admitting: Family Medicine

## 2020-10-28 DIAGNOSIS — R109 Unspecified abdominal pain: Secondary | ICD-10-CM | POA: Diagnosis not present

## 2020-10-28 DIAGNOSIS — B961 Klebsiella pneumoniae [K. pneumoniae] as the cause of diseases classified elsewhere: Secondary | ICD-10-CM | POA: Diagnosis not present

## 2020-10-28 DIAGNOSIS — R7881 Bacteremia: Secondary | ICD-10-CM | POA: Diagnosis not present

## 2020-10-28 LAB — BASIC METABOLIC PANEL
Anion gap: 9 (ref 5–15)
BUN: 43 mg/dL — ABNORMAL HIGH (ref 8–23)
CO2: 22 mmol/L (ref 22–32)
Calcium: 9.2 mg/dL (ref 8.9–10.3)
Chloride: 101 mmol/L (ref 98–111)
Creatinine, Ser: 1.33 mg/dL — ABNORMAL HIGH (ref 0.61–1.24)
GFR, Estimated: 58 mL/min — ABNORMAL LOW (ref >60)
Glucose, Bld: 128 mg/dL — ABNORMAL HIGH (ref 70–99)
Potassium: 4.1 mmol/L (ref 3.5–5.1)
Sodium: 132 mmol/L — ABNORMAL LOW (ref 135–145)

## 2020-10-28 LAB — CBC
HCT: 31.3 % — ABNORMAL LOW (ref 39.0–52.0)
Hemoglobin: 9.7 g/dL — ABNORMAL LOW (ref 13.0–17.0)
MCH: 28.6 pg (ref 26.0–34.0)
MCHC: 31 g/dL (ref 30.0–36.0)
MCV: 92.3 fL (ref 80.0–100.0)
Platelets: 455 10*3/uL — ABNORMAL HIGH (ref 150–400)
RBC: 3.39 MIL/uL — ABNORMAL LOW (ref 4.22–5.81)
RDW: 15.5 % (ref 11.5–15.5)
WBC: 18.7 10*3/uL — ABNORMAL HIGH (ref 4.0–10.5)
nRBC: 0 % (ref 0.0–0.2)

## 2020-10-28 MED ORDER — HYDROMORPHONE HCL 2 MG PO TABS
6.0000 mg | ORAL_TABLET | ORAL | Status: DC
  Administered 2020-10-28 – 2020-10-30 (×12): 6 mg via ORAL
  Filled 2020-10-28 (×13): qty 3

## 2020-10-28 MED ORDER — ACETAMINOPHEN 325 MG PO TABS
650.0000 mg | ORAL_TABLET | Freq: Four times a day (QID) | ORAL | Status: DC | PRN
  Administered 2020-10-29: 650 mg via ORAL
  Filled 2020-10-28: qty 2

## 2020-10-28 MED ORDER — HYDROMORPHONE HCL 2 MG PO TABS: 6.0000 mg | ORAL_TABLET | ORAL | Status: DC

## 2020-10-28 NOTE — Progress Notes (Signed)
Spoke with and updated Ms. Evans, the care giver, at [phone].  Please keep her updated on DC plans as they evolve.

## 2020-10-28 NOTE — Progress Notes (Addendum)
Family Medicine Teaching Service Daily Progress Note Intern Pager: [phone]  Patient name: Andre Glass Medical record number: [id number] Date of birth: 02-Feb-1951 Age: 70 y.o. Gender: male  Primary Care Provider: [name], DO Consultants: ID, Urology, Palliative, Neuro (s/o), IR (s/o)  Code Status: Full  Pt Overview and Major Events to Date:  7/15- admitted  Assessment and Plan: Andre Glass is a 70 yo male with a hx of metastatic prostate CA, symptomatic anemia, and obstructive uropathy with recurrent UTIs, now with bilateral nephrostomy tubes. He presented with AMS and myoclonic jerking, both now resolved. Found to have Klebsiella bacteremia which we have been treating.  Abdominal Pain  Diarrhea  Transient Fever UTI  Klebsiella Bacteremia No further fever in past 24 hours. Continue to have a leukocytosis to 18.7.   Diarrhea improving, but still with 5 episodes loose stool yesterday. GIPP and C diff neg. Consider overflow diarrhea given his chronic constipation and significant stool burden noted on previous CT Abd. CXR yesterday negative for pneumonia. ID consulted yesterday with no changes to current abx regimen. Possible that transient fever is related to metastatic prostate CA.  Pain overnight was better controlled with scheduled Dilaudid.  It seems that his caregiver Vanetta Shawl has him on a scheduled regimen at home. - Continue Keflex (day 8/14) - ID following, appreciate recs  - Continue scheduled Dilaudid 6-8mg  q4 - If diarrhea continues, consider reimaging abdomen in AM - If diarrhea and pain improve, could discharge tomorrow   Obstructive Uropathy s/p nephrostomy  No further leaking at tube site after tube exchange on 7/22.   Metastatic Prostate Cancer Chronic Cancer Pain -Continue PO dilaudid 6-8mg  q4 -Continue Lyrica 50mg  BID -Continue mirtazapine 7.5mg  qhs   Anemia Hgb 9.7. Stable. - Daily CBC  HTN Last BP 134/76. - Continue amlodipine 10mg   daily  FEN/GI: Regular PPx: Lovenox Dispo:Home with home health  tomorrow. Barriers include pain control.   Subjective:  Andre Glass reports feeling better this morning after receiving scheduled doses of Dilaudid overnight.  He says he feels that his diarrhea is somewhat improved this morning but still had 5 or so episodes yesterday.  Denies any leaking around his nephrostomy tube sites.  Objective: Temp:  [97.7 F (36.5 C)-98.2 F (36.8 C)] 97.7 F (36.5 C) (07/25 0350) Pulse Rate:  [78-81] 80 (07/25 0350) Resp:  [18-19] 18 (07/24 1944) BP: (134-141)/(72-76) 134/76 (07/25 0350) SpO2:  [99 %-100 %] 100 % (07/24 1944) Physical Exam: General: Tired appearing, chronically ill gentleman lying in bed Cardiovascular: Regular rate, regular rhythm, no murmurs, rubs, or gallops Respiratory: Lungs clear to auscultation, normal work of breathing on room air Abdomen: Bowel sounds normal, tender to palpation, worse in lower quadrants but improved from previous exam Extremities: Without edema  Laboratory: Recent Labs  Lab 10/25/20 1324 10/27/20 0112 10/28/20 0315  WBC 18.1* 19.6* 18.7*  HGB 10.6* 10.2* 9.7*  HCT 34.6* 32.2* 31.3*  PLT 467* 433* 455*   Recent Labs  Lab 10/23/20 0435 10/24/20 0433 10/25/20 0328 10/28/20 0315  NA 136 135 134* 132*  K 4.3 5.1 5.0 4.1  CL 105 104 105 101  CO2 21* 23 22 22   BUN 21 25* 34* 43*  CREATININE 1.24 1.32* 1.35* 1.33*  CALCIUM 9.4 9.4 9.1 9.2  PROT 8.3* 7.7 7.4  --   BILITOT 0.5 0.5 0.5  --   ALKPHOS 139* 130* 127*  --   ALT 58* 53* 52*  --   AST 34 30 35  --  GLUCOSE 102* 83 111* 128*    Imaging/Diagnostic Tests: CXR with no active cardiopulmonary disease  Eppie Gibson, MD 10/28/2020, 6:17 AM PGY-1, [facility] Intern pager: [phone], text pages welcome

## 2020-10-28 NOTE — Progress Notes (Signed)
Physical Therapy Treatment Patient Details Name: Andre Glass MRN: [id number] DOB: [birth year] Today's Date: 10/28/2020    History of Present Illness The pt is a 70 yo male presenting 7/14 with AMS and "jerking" movements. The pt was reccently admitted for obstructive uropathy with bilateral hydronephrosis and is now s/p placement of percutaneous nephrostomy tubes bilaterally. Work up this admission revealed UTI (klebsiella). PMH includes: metastatic adenocarinoma of prostate, chornic pain, HTN, and CKD.    PT Comments    Pt asleep in chair on entry, reporting no pain but increased fatigue today. Agreeable to walking with therapist in hallway. Pt min guard for transfers and ambulation with RW, however pt with decreased strength and endurance today. Pt with decreasing velocity and increasing L foot drag with distance. Pt requesting to turn around after 50 feet ambulation. Pt reports that he knows he should be eating more and that nutritionist was suggesting supplemental feedings, which he reports he does not want. PT voices understanding and encouragement especially of increased protein ingestion. D/c plans remain appropriate. PT will continue to follow acutely.    Follow Up Recommendations  Home health PT;Supervision for mobility/OOB     Equipment Recommendations  None recommended by PT (pt well equipped)       Precautions / Restrictions Precautions Precautions: Fall Precaution Comments: bilateral nephrostomy Restrictions Weight Bearing Restrictions: No    Mobility  Bed Mobility              General bed mobility comments: sitting up in recliner on entry    Transfers Overall transfer level: Needs assistance Equipment used: None Transfers: Sit to/from Stand;Stand Pivot Transfers Sit to Stand: Min guard Stand pivot transfers: Min guard       General transfer comment: supervision for safety  Ambulation/Gait Ambulation/Gait assistance: Min guard Gait Distance (Feet): 100  Feet Assistive device: Rolling walker (2 wheeled) Gait Pattern/deviations: Step-through pattern Gait velocity: decreased Gait velocity interpretation: <1.8 ft/sec, indicate of risk for recurrent falls General Gait Details: min guard for safety, pt with decreasing gait velocity and L foot clearance with distance, request to return to room after 50 ffet ambulation       Balance Overall balance assessment: Needs assistance Sitting-balance support: No upper extremity supported;Feet supported Sitting balance-Leahy Scale: Good     Standing balance support: Single extremity supported;During functional activity Standing balance-Leahy Scale: Fair Standing balance comment: able to static stand without assist to don mask                            Cognition Arousal/Alertness: Awake/alert Behavior During Therapy: WFL for tasks assessed/performed;Flat affect Overall Cognitive Status: Within Functional Limits for tasks assessed                                 General Comments: slowed response         General Comments General comments (skin integrity, edema, etc.): VSS on RA, pt reporting increased fatigue today      Pertinent Vitals/Pain Pain Assessment: No/denies pain Faces Pain Scale: Hurts little more Pain Location: nephrostomy sites Pain Descriptors / Indicators: Discomfort;Sore Pain Intervention(s): Monitored during session;Repositioned     PT Goals (current goals can now be found in the care plan section) Acute Rehab PT Goals Patient Stated Goal: return home PT Goal Formulation: With patient Time For Goal Achievement: 11/02/20 Potential to Achieve Goals: Good    Frequency  Min 3X/week      PT Plan Current plan remains appropriate       AM-PAC PT "6 Clicks" Mobility   Outcome Measure  Help needed turning from your back to your side while in a flat bed without using bedrails?: None Help needed moving from lying on your back to sitting  on the side of a flat bed without using bedrails?: None Help needed moving to and from a bed to a chair (including a wheelchair)?: A Little Help needed standing up from a chair using your arms (e.g., wheelchair or bedside chair)?: A Little Help needed to walk in hospital room?: A Little Help needed climbing 3-5 steps with a railing? : A Little 6 Click Score: 20    End of Session Equipment Utilized During Treatment: Gait belt Activity Tolerance: Patient tolerated treatment well;Patient limited by fatigue Patient left: with call bell/phone within reach;in chair Nurse Communication: Mobility status PT Visit Diagnosis: Unsteadiness on feet (R26.81);Other abnormalities of gait and mobility (R26.89);Muscle weakness (generalized) (M62.81)     Time: 6073-7106 PT Time Calculation (min) (ACUTE ONLY): 14 min  Charges:  $Therapeutic Exercise: 8-22 mins                     Ashey Tramontana B. Migdalia Dk PT, DPT Acute Rehabilitation Services Pager [phone] Office [phone]    [place] 10/28/2020, 3:36 PM

## 2020-10-28 NOTE — Progress Notes (Signed)
FPTS Brief Progress Note  S: Pt lying supine in bed.  Has a towel wrapped around his head to keep him warm.  He states he is doing okay but still has diffuse abdominal pain and had an episode of explosive diarrhea earlier today.  He would like to go home when possible but is unsure if he is ready at this time.   O: BP 126/71   Pulse 76   Temp 98 F (36.7 C) (Oral)   Resp 16   Ht 5\' 8"  (1.727 m)   Wt 77.9 kg   SpO2 99%   BMI 26.11 kg/m   General: lying in bed, NAD Respiratory: breathing comfortably on RA Demand: Soft, nondistended, diffuse tenderness to palpation  A/P: Metastatic prostate cancer -Continue to manage pain with Tylenol and hydromorphone -Consider repeat CT scan of abdomen if abdominal pain and diarrhea worsen tomorrow Continue to follow plan outlined in day teams progress note - Orders reviewed. Labs for AM ordered, which was adjusted as needed.    [name], DO 10/28/2020, 7:33 PM PGY-1, [facility] Night Resident  Please page [phone] with questions.

## 2020-10-28 NOTE — Progress Notes (Signed)
Occupational Therapy Treatment Patient Details Name: Andre Glass MRN: [id number] DOB: [birth year] Today's Date: 10/28/2020    History of present illness The pt is a 70 yo male presenting 7/14 with AMS and "jerking" movements. The pt was reccently admitted for obstructive uropathy with bilateral hydronephrosis and is now s/p placement of percutaneous nephrostomy tubes bilaterally. Work up this admission revealed UTI (klebsiella). PMH includes: metastatic adenocarinoma of prostate, chornic pain, HTN, and CKD.   OT comments  Pt making progress with functional goals. Pt's brother present this session. Pt in bed upon arrival, agreeable to OOB activity. Session focused on sit - stand transitions, functional mobility to bathroom and to doorway, functional transfers, LB simulated bathing, LB dressing. OT will continue to follow acutely to maximize level of function and safety  Follow Up Recommendations  Home health OT;Supervision - Intermittent    Equipment Recommendations  None recommended by OT (pt has a Secondary school teacher)    Recommendations for Other Services      Precautions / Restrictions Precautions Precautions: Fall Precaution Comments: bilateral nephrostomy Restrictions Weight Bearing Restrictions: No       Mobility Bed Mobility Overal bed mobility: Modified Independent                  Transfers Overall transfer level: Needs assistance Equipment used: 1 person hand held assist Transfers: Sit to/from Stand;Stand Pivot Transfers Sit to Stand: Min assist;Min guard Stand pivot transfers: Min guard       General transfer comment: min guard A initially from EOB    Balance Overall balance assessment: Needs assistance Sitting-balance support: No upper extremity supported;Feet supported Sitting balance-Leahy Scale: Good     Standing balance support: Single extremity supported;During functional activity Standing balance-Leahy Scale: Fair                              ADL either performed or assessed with clinical judgement   ADL Overall ADL's : Needs assistance/impaired     Grooming: Wash/dry hands;Wash/dry face;Min guard;Standing       Lower Body Bathing: Min Psychologist, educational;Sit to/from stand Lower Body Bathing Details (indicate cue type and reason): simulated     Lower Body Dressing: Min guard;Sit to/from stand   Toilet Transfer: Min guard;Ambulation;RW;Cueing for safety   Toileting- Clothing Manipulation and Hygiene: Min guard;Sit to/from stand       Functional mobility during ADLs: Min guard;Supervision/safety;Cueing for safety       Vision Patient Visual Report: No change from baseline     Perception     Praxis      Cognition Arousal/Alertness: Awake/alert Behavior During Therapy: WFL for tasks assessed/performed;Flat affect Overall Cognitive Status: Within Functional Limits for tasks assessed                                          Exercises     Shoulder Instructions       General Comments      Pertinent Vitals/ Pain       Pain Assessment: Faces Faces Pain Scale: Hurts little more Pain Location: nephrostomy sites Pain Descriptors / Indicators: Discomfort;Sore Pain Intervention(s): Monitored during session;Repositioned  Home Living  Prior Functioning/Environment              Frequency  Min 2X/week        Progress Toward Goals  OT Goals(current goals can now be found in the care plan section)  Progress towards OT goals: Progressing toward goals  Acute Rehab OT Goals Patient Stated Goal: return home  Plan Discharge plan remains appropriate    Co-evaluation                 AM-PAC OT "6 Clicks" Daily Activity     Outcome Measure   Help from another person eating meals?: None Help from another person taking care of personal grooming?: None Help from another person toileting, which includes using  toliet, bedpan, or urinal?: A Little Help from another person bathing (including washing, rinsing, drying)?: A Little Help from another person to put on and taking off regular upper body clothing?: None Help from another person to put on and taking off regular lower body clothing?: A Little 6 Click Score: 21    End of Session Equipment Utilized During Treatment: Gait belt  OT Visit Diagnosis: Unsteadiness on feet (R26.81);Muscle weakness (generalized) (M62.81);Pain;History of falling (Z91.81) Pain - Right/Left: Right Pain - part of body: Hip;Leg (drain sites)   Activity Tolerance Patient tolerated treatment well   Patient Left in chair;with call bell/phone within reach;with family/visitor present   Nurse Communication          Time: 3762-8315 OT Time Calculation (min): 23 min  Charges: OT General Charges $OT Visit: 1 Visit OT Treatments $Self Care/Home Management : 8-22 mins $Therapeutic Activity: 8-22 mins     Britt Bottom 10/28/2020, 1:45 PM

## 2020-10-28 NOTE — Progress Notes (Signed)
Nutrition Follow-up  DOCUMENTATION CODES:  Severe malnutrition in context of chronic illness  INTERVENTION:  Continue Ensure Enlive TID.  Continue Magic Cup TID.  Continue MVI with minerals daily.  Add Vital Cuisine Shake BID, each supplement provides 520 kcal and 22 grams of protein.  Downgrade regular diet to Dysphagia 3.  NUTRITION DIAGNOSIS:  Severe Malnutrition related to chronic illness, cancer and cancer related treatments as evidenced by percent weight loss, moderate fat depletion, severe fat depletion, moderate muscle depletion, severe muscle depletion, energy intake < or equal to 50% for > or equal to 1 month.  GOAL:  Patient will meet greater than or equal to 90% of their needs  MONITOR:  PO intake, Supplement acceptance, Labs, Weight trends, I & O's  REASON FOR ASSESSMENT:  Consult Assessment of nutrition requirement/status  ASSESSMENT:  Andre Glass is a 70 y.o. male presenting with altered mental status and increased myoclonic jerking. PMH is significant for metastatic prostate cancer, symptomatic anemia, recurrent UTI,  hypertension, and recent admission for obstructive uropathy with bilateral hydronephrosis and placement of bilateral percutaneous nephrostomy tubes. 7/22 - R nephrostomy exchange in IR  PO intake remains inadequate - 0-50% for the last 8 documented meals.   Discussed with pt and family at length regarding next steps for nutrition. Pt is not interested in TF at this time. Pt to confirm wishes with palliative care. RD to recommend adding Hormel shakes BID.  Family concerned about food being too tough for pt to chew comfortably - RD to downgrade diet to dysphagia 3 for comfort.  Admit wt: 84 kg Current wt: 77.9 kg Pt is losing weight during admission likely due to poor PO and metastatic prostate cancer.  Recommend continuing Ensure Enlive TID and Magic Cup TID.  Medications: reviewed; EE TID, Dilaudid every 4 hrs, Remeron, MVI with minerals,  prednisone  Labs: reviewed; Na 132 (L), Glucose 128 (H)  NUTRITION - FOCUSED PHYSICAL EXAM: Flowsheet Row Most Recent Value  Orbital Region Moderate depletion  Upper Arm Region Severe depletion  Thoracic and Lumbar Region Mild depletion  Buccal Region Severe depletion  Temple Region Moderate depletion  Clavicle Bone Region Moderate depletion  Clavicle and Acromion Bone Region Moderate depletion  Scapular Bone Region Unable to assess  Dorsal Hand Severe depletion  Patellar Region Severe depletion  Anterior Thigh Region Severe depletion  Posterior Calf Region Severe depletion  Edema (RD Assessment) None  Hair Reviewed  Eyes Reviewed  Mouth Reviewed  Skin Reviewed  Nails Reviewed   Diet Order:   Diet Order             Diet regular Room service appropriate? Yes with Assist; Fluid consistency: Thin  Diet effective now                  EDUCATION NEEDS:  Education needs have been addressed  Skin:  Skin Assessment: Reviewed RN Assessment  Last BM:  10/26/20 - Type 6, medium  Height:  Ht Readings from Last 1 Encounters:  10/18/20 5\' 8"  (1.727 m)   Weight:  Wt Readings from Last 1 Encounters:  10/25/20 77.9 kg   BMI:  Body mass index is 26.11 kg/m.  Estimated Nutritional Needs:  Kcal:  2100-2300 Protein:  125-140 grams Fluid:  > 2 L  Derrel Nip, RD, LDN (she/her/hers) Registered Dietitian I After-Hours/Weekend Pager # in [phone]

## 2020-10-28 NOTE — Progress Notes (Signed)
[facility] for Infectious Disease  Date of Admission:  10/17/2020     CC: Kleb pna bacteremia  Lines: Old right chest port-a-cath  Abx: Cephalexin 7/19-c  Ceftriaxone 7/15-19    ASSESSMENT: 70 yo male metastatic prostate cancer, port-c-cath presence, bladder obstruction outlet obstruction s/p nephrostomy tubes admitted with sepsis in setting kleb pna bacteremia   7/15 bcx positive 7/23 bcx negative  Source likely GU Bilateral nephrostomy tubes draining well  7/22 has right nephrostomy tube manipulation and another episode fever but 7/23 bcx negative  Reviewed labs/imaging Doing well on cephalexin.  Moderate leukocytosis likely large part due to tumor burden and infection  PLAN: Plan 10 days antibiotics from 7/15 or 7 days from 7/22, whichever is longer (last day would be 7/29 Continue cephalexin until 7/29 If discharge, can discharge on cefadroxil 1gram bid dosing Discussed with primary team 5.   Will sign off, please call if further question    I spent more than 35 minute reviewing data/chart, and coordinating care and >50% direct face to face time providing counseling/discussing diagnostics/treatment plan with patient   Active Problems:   Symptomatic anemia   Altered mental status   Myoclonus   Anemia   Bacteremia due to Klebsiella pneumoniae   Acute cystitis without hematuria   Severe protein-calorie malnutrition (HCC)   Abdominal pain   Allergies  Allergen Reactions   Ibuprofen Anaphylaxis   Shrimp [Shellfish Allergy] Anaphylaxis    Scheduled Meds:  amLODipine  10 mg Oral Daily   cephALEXin  500 mg Oral Q6H   Chlorhexidine Gluconate Cloth  6 each Topical Daily   enoxaparin (LOVENOX) injection  40 mg Subcutaneous Q24H   feeding supplement  237 mL Oral TID BM   HYDROmorphone  6 mg Oral Q4H   mirtazapine  7.5 mg Oral QHS   multivitamin with minerals  1 tablet Oral Daily   predniSONE  5 mg Oral Q breakfast   pregabalin  50 mg  Oral BID   Continuous Infusions: PRN Meds:.acetaminophen, lidocaine (PF), lidocaine, ondansetron (ZOFRAN) IV, prochlorperazine, sodium chloride flush, tamsulosin   SUBJECTIVE: Doing much better since admission Appetite is not best but at baseline No fever since 7/23 Repeat bcx 7/23 negative Wbc stable  Review of Systems: ROS All other ROS was negative, except mentioned above     OBJECTIVE: Vitals:   10/27/20 1738 10/27/20 1944 10/28/20 0350 10/28/20 0952  BP: (!) 141/72 140/76 134/76 (!) 101/56  Pulse: 81  80 78  Resp: 19 18  18   Temp: 98.1 F (36.7 C) 98.2 F (36.8 C) 97.7 F (36.5 C) 98.6 F (37 C)  TempSrc: Oral Oral Oral Oral  SpO2: 99% 100%  99%  Weight:      Height:       Body mass index is 26.11 kg/m.  Physical Exam General/constitutional: no distress, pleasant HEENT: Normocephalic, PER, Conj Clear, EOMI, Oropharynx clear Neck supple CV: rrr no mrg Lungs: clear to auscultation, normal respiratory effort Abd: Soft, Nontender Ext: no edema Skin: No Rash Neuro: nonfocal MSK: no peripheral joint swelling/tenderness/warmth; back spines nontender   Central line presence: right chest port site no tenderness/erythema/purulence   Lab Results Lab Results  Component Value Date   WBC 18.7 (H) 10/28/2020   HGB 9.7 (L) 10/28/2020   HCT 31.3 (L) 10/28/2020   MCV 92.3 10/28/2020   PLT 455 (H) 10/28/2020    Lab Results  Component Value Date   CREATININE 1.33 (H) 10/28/2020  BUN 43 (H) 10/28/2020   NA 132 (L) 10/28/2020   K 4.1 10/28/2020   CL 101 10/28/2020   CO2 22 10/28/2020    Lab Results  Component Value Date   ALT 52 (H) 10/25/2020   AST 35 10/25/2020   ALKPHOS 127 (H) 10/25/2020   BILITOT 0.5 10/25/2020      Microbiology: Recent Results (from the past 240 hour(s))  C Difficile Quick Screen (NO PCR Reflex)     Status: None   Collection Time: 10/22/20  8:58 AM   Specimen: STOOL  Result Value Ref Range Status   C Diff antigen NEGATIVE  NEGATIVE Final   C Diff toxin NEGATIVE NEGATIVE Final   C Diff interpretation No C. difficile detected.  Final    Comment: Performed at [facility], [place] [address], [place], [place] [address]  Gastrointestinal Panel by PCR , Stool     Status: None   Collection Time: 10/22/20  8:58 AM   Specimen: Stool  Result Value Ref Range Status   Campylobacter species NOT DETECTED NOT DETECTED Final   Plesimonas shigelloides NOT DETECTED NOT DETECTED Final   Salmonella species NOT DETECTED NOT DETECTED Final   Yersinia enterocolitica NOT DETECTED NOT DETECTED Final   Vibrio species NOT DETECTED NOT DETECTED Final   Vibrio cholerae NOT DETECTED NOT DETECTED Final   Enteroaggregative E coli (EAEC) NOT DETECTED NOT DETECTED Final   Enteropathogenic E coli (EPEC) NOT DETECTED NOT DETECTED Final   Enterotoxigenic E coli (ETEC) NOT DETECTED NOT DETECTED Final   Shiga like toxin producing E coli (STEC) NOT DETECTED NOT DETECTED Final   Shigella/Enteroinvasive E coli (EIEC) NOT DETECTED NOT DETECTED Final   Cryptosporidium NOT DETECTED NOT DETECTED Final   Cyclospora cayetanensis NOT DETECTED NOT DETECTED Final   Entamoeba histolytica NOT DETECTED NOT DETECTED Final   Giardia lamblia NOT DETECTED NOT DETECTED Final   Adenovirus F40/41 NOT DETECTED NOT DETECTED Final   Astrovirus NOT DETECTED NOT DETECTED Final   Norovirus GI/GII NOT DETECTED NOT DETECTED Final   Rotavirus A NOT DETECTED NOT DETECTED Final   Sapovirus (I, II, IV, and V) NOT DETECTED NOT DETECTED Final    Comment: Performed at [facility], [address] [place], [place] [address]  Urine Culture     Status: None   Collection Time: 10/26/20  1:02 PM   Specimen: Urine, Catheterized  Result Value Ref Range Status   Specimen Description URINE, CATHETERIZED  Final   Special Requests Immunocompromised  Final   Culture   Final    NO GROWTH Performed at [facility], [address], [place], [place] [address]     Report Status 10/27/2020 FINAL  Final  Culture, blood (routine x 2)     Status: None (Preliminary result)   Collection Time: 10/26/20  1:13 PM   Specimen: BLOOD  Result Value Ref Range Status   Specimen Description BLOOD RIGHT ANTECUBITAL  Final   Special Requests   Final    BOTTLES DRAWN AEROBIC AND ANAEROBIC Blood Culture adequate volume   Culture   Final    NO GROWTH 2 DAYS Performed at [facility], [place] [address] [place], [place] [address]    Report Status PENDING  Incomplete  Culture, blood (routine x 2)     Status: None (Preliminary result)   Collection Time: 10/26/20  1:14 PM   Specimen: BLOOD LEFT HAND  Result Value Ref Range Status   Specimen Description BLOOD LEFT HAND  Final   Special Requests   Final    BOTTLES DRAWN AEROBIC AND ANAEROBIC Blood Culture results may not be optimal due to an inadequate volume of blood received in culture bottles   Culture   Final    NO GROWTH 2 DAYS Performed at [facility], [place] [address], [place], [place] [address]    Report Status PENDING  Incomplete     Serology:   Imaging: If present, new imagings (plain films, ct scans, and mri) have been personally visualized and interpreted; radiology reports have been reviewed. Decision making incorporated into the Impression / Recommendations.   7/22 abd pelv ct 1. Metastatic locally invasive prostate cancer with progressive adenopathy in the pelvis and retroperitoneum. 2. Bilateral nephrostomy tubes in place. Slight prominence of the renal collecting systems without frank hydronephrosis. Heterogeneous renal enhancement with slight enhancement of the right renal pelvis, recommend correlation with nephrostomy output to assess for urinary tract infection. 3. Diffuse bladder wall thickening with perivesicular fat stranding. 4. Multifocal sclerotic osseous metastasis. 5. Small subcutaneous density in the left anterior abdominal wall soft tissues may be related to medication  injection site or a soft tissue nodule  Jabier Mutton, [place] [facility] [place] [phone] pager    10/28/2020, 11:01 AM

## 2020-10-29 DIAGNOSIS — R109 Unspecified abdominal pain: Secondary | ICD-10-CM | POA: Diagnosis not present

## 2020-10-29 LAB — CBC
HCT: 30.7 % — ABNORMAL LOW (ref 39.0–52.0)
Hemoglobin: 9.5 g/dL — ABNORMAL LOW (ref 13.0–17.0)
MCH: 28.5 pg (ref 26.0–34.0)
MCHC: 30.9 g/dL (ref 30.0–36.0)
MCV: 92.2 fL (ref 80.0–100.0)
Platelets: 423 10*3/uL — ABNORMAL HIGH (ref 150–400)
RBC: 3.33 MIL/uL — ABNORMAL LOW (ref 4.22–5.81)
RDW: 15.4 % (ref 11.5–15.5)
WBC: 16.3 10*3/uL — ABNORMAL HIGH (ref 4.0–10.5)
nRBC: 0 % (ref 0.0–0.2)

## 2020-10-29 MED ORDER — SORBITOL 70 % SOLN
960.0000 mL | TOPICAL_OIL | Freq: Once | ORAL | Status: AC
  Administered 2020-10-29: 960 mL via RECTAL
  Filled 2020-10-29: qty 473

## 2020-10-29 NOTE — Progress Notes (Signed)
This chaplain is present with the Pt., Pt. brother-Andre Glass, the notary, and two witnesses for the notarizing of the Pt. Advance Directive:  HCPOA and Living Will.  The Pt. named Andre Glass as his healthcare agent and if the healthcare agent is unable or unwilling to serve the Pt. next choice is Andre Glass.  The chaplain gave the Pt. the original and two copies of the AD.  The chaplain scanned a copy  of the AD into the Pt. EMR.   This chaplain is available for F/U spiriutal care as needed.

## 2020-10-29 NOTE — Plan of Care (Signed)
  Problem: Pain Managment: Goal: General experience of comfort will improve Outcome: Progressing   Problem: Urinary Elimination: Goal: Signs and symptoms of infection will decrease Outcome: Progressing

## 2020-10-29 NOTE — Plan of Care (Signed)
  Problem: Pain Managment: Goal: General experience of comfort will improve Outcome: Progressing   Problem: Safety: Goal: Ability to remain free from injury will improve Outcome: Progressing   

## 2020-10-29 NOTE — Care Management Important Message (Signed)
Important Message  Patient Details  Name: Andre Glass MRN: [id number] Date of Birth: 1950/08/26   Medicare Important Message Given:  Yes - Important Message mailed due to current National Emergency   Verbal consent obtained due to current National Emergency  Relationship to patient: Self Contact Name: Ilhan Call Date: 10/29/20  Time: 1301 Phone: [phone] Outcome: Spoke with contact Important Message mailed to: Patient address on file   Delorse Lek 10/29/2020, 1:01 PM

## 2020-10-29 NOTE — Progress Notes (Signed)
This chaplain is present for F/U spiritual care and the completion of the Pt. AD.  The Pt. Is sitting up and eating a banana at the time of the visit.  The chaplain understands the Pt. prefers notarizing his Advance Directive in the hospital.  The chaplain will move forward with facilitating the notary and witnesses. The chaplain will update the Pt. RN-Ren.

## 2020-10-29 NOTE — Progress Notes (Addendum)
FPTS Brief Progress Note  S: Pt initially sleeping, woke up.  Stated that he has continued abdominal pain and rates it as a 8/10.  Michela Pitcher that he had a bowel movement earlier today after his enema which was a mixture of diarrhea and some solid stool.  He will contemplate having another enema tomorrow morning.  He expresses desire to go home tomorrow.   O: BP 129/67 (BP Location: Right Arm)   Pulse 77   Temp 99.5 F (37.5 C) (Oral)   Resp 17   Ht 5\' 8"  (1.727 m)   Wt 77.9 kg   SpO2 98%   BMI 26.11 kg/m   General: lying in bed, NAD Respiratory: breathing comfortably on RA Abdominal: Soft, nondistended, diffuse tenderness to palpation in, worse in the left upper quadrant  A/P: Continue to follow plan outlined in day teams progress note -Consider enema in the morning -Consider discharge tomorrow - Orders reviewed. Labs for AM ordered, which was adjusted as needed.    [name], DO 10/29/2020, 9:08 PM PGY-1, [facility] Night Resident  Please page [phone] with questions.

## 2020-10-29 NOTE — Progress Notes (Signed)
Family Medicine Teaching Service Daily Progress Note Intern Pager: [phone]  Patient name: Andre Glass Medical record number: [id number] Date of birth: 01-05-1951 Age: 70 y.o. Gender: male  Primary Care Provider: [name], DO Consultants: ID, Urology, Palliative, Neuro (s/o), IR (s/o) Code Status: Full  Pt Overview and Major Events to Date:  7/15- admitted   Assessment and Plan: Andre Glass is a 70yo male with a hx of metastatic prostate CA, symptomatic anemia, and obstructive uropathy with recurrent UTIs, now with bilateral nephrostomy tubes. He presented with AMS and myoclonic jerking, both now resolved. Found to have Klebsiella bacteremia which we have been treating.   Abdominal Pain  Diarrhea  Transient Fever UTI  Klebsiella Bacteremia No fever in past 24 hours. Leukocytosis downtrending 18.7-16.3. Abdomen remains uncomfortable, but pain is controlled on current regimen. Still with episodic diarrhea.  With his history, have high concern for encopresis. Given stool burden noted on CT on 7/22 and DRE today consistent with impaction, will offer  enema. Has successfully been cleared with SMOG enema in the past.  - Continue Keflex (day 9/12) - SMOG enema  - ID following, appreciate recs - Continue scheduled Dilaudid 6mg  q4  Metastatic Prostate Cancer Chronic Cancer Pain Currently receiving 144 MME per day, compared to 244 he was receiving on an outpatient basis.  - Continue Dilaudid as above - Continue Lyrica 50mg  BID - Continue mirtazapine 7.5mg  qhs  Anemia  Hgb 9.5, stable.   FEN/GI: Regular PPx: Lovenox Dispo:Home tomorrow. Barriers include controlling diarrhea.   Subjective:  Andre Glass reports that his abdominal pain is somewhat improved this morning.  However he remains concerned about his "explosive" diarrhea that is not improved.  He denies ever having this before.  He feels that he could go home as soon as his diarrhea improves.   Objective: Temp:  [97.8 F (36.6  C)-98.6 F (37 C)] 98.1 F (36.7 C) (07/26 0526) Pulse Rate:  [72-84] 72 (07/26 0526) Resp:  [16-18] 16 (07/25 1819) BP: (101-137)/(56-82) 125/76 (07/26 0526) SpO2:  [98 %-100 %] 98 % (07/26 0526) Physical Exam: General: Tired appearing, chronically ill man lying in bed Cardiovascular: RRR, no m/r/g Respiratory: Lungs clear to auscultation bilaterally, normal WOB on RA Abdomen: Diffusely tender to palpation Extremities: Without edema or ecchymoses  Rectum: Digital rectal exam with diffuse hardened prostate and stool  Laboratory: Recent Labs  Lab 10/27/20 0112 10/28/20 0315 10/29/20 0358  WBC 19.6* 18.7* 16.3*  HGB 10.2* 9.7* 9.5*  HCT 32.2* 31.3* 30.7*  PLT 433* 455* 423*   Recent Labs  Lab 10/23/20 0435 10/24/20 0433 10/25/20 0328 10/28/20 0315  NA 136 135 134* 132*  K 4.3 5.1 5.0 4.1  CL 105 104 105 101  CO2 21* 23 22 22   BUN 21 25* 34* 43*  CREATININE 1.24 1.32* 1.35* 1.33*  CALCIUM 9.4 9.4 9.1 9.2  PROT 8.3* 7.7 7.4  --   BILITOT 0.5 0.5 0.5  --   ALKPHOS 139* 130* 127*  --   ALT 58* 53* 52*  --   AST 34 30 35  --   GLUCOSE 102* 83 111* 128*      Imaging/Diagnostic Tests: No new imaging/tests  Eppie Gibson, MD 10/29/2020, 9:13 AM PGY-1, [place] Intern pager: [phone], text pages welcome

## 2020-10-30 DIAGNOSIS — R109 Unspecified abdominal pain: Secondary | ICD-10-CM | POA: Diagnosis not present

## 2020-10-30 LAB — CBC
HCT: 30.1 % — ABNORMAL LOW (ref 39.0–52.0)
Hemoglobin: 9.6 g/dL — ABNORMAL LOW (ref 13.0–17.0)
MCH: 29.3 pg (ref 26.0–34.0)
MCHC: 31.9 g/dL (ref 30.0–36.0)
MCV: 91.8 fL (ref 80.0–100.0)
Platelets: 421 10*3/uL — ABNORMAL HIGH (ref 150–400)
RBC: 3.28 MIL/uL — ABNORMAL LOW (ref 4.22–5.81)
RDW: 15.3 % (ref 11.5–15.5)
WBC: 16.6 10*3/uL — ABNORMAL HIGH (ref 4.0–10.5)
nRBC: 0 % (ref 0.0–0.2)

## 2020-10-30 LAB — BASIC METABOLIC PANEL
Anion gap: 9 (ref 5–15)
BUN: 48 mg/dL — ABNORMAL HIGH (ref 8–23)
CO2: 22 mmol/L (ref 22–32)
Calcium: 9 mg/dL (ref 8.9–10.3)
Chloride: 101 mmol/L (ref 98–111)
Creatinine, Ser: 1.13 mg/dL (ref 0.61–1.24)
GFR, Estimated: >60 mL/min (ref >60)
Glucose, Bld: 109 mg/dL — ABNORMAL HIGH (ref 70–99)
Potassium: 4.3 mmol/L (ref 3.5–5.1)
Sodium: 132 mmol/L — ABNORMAL LOW (ref 135–145)

## 2020-10-30 MED ORDER — SORBITOL 70 % SOLN
960.0000 mL | TOPICAL_OIL | Freq: Once | ORAL | Status: AC
  Administered 2020-10-30: 960 mL via RECTAL
  Filled 2020-10-30: qty 473

## 2020-10-30 MED ORDER — CEFADROXIL 500 MG PO CAPS: 500.0000 mg | ORAL_CAPSULE | Freq: Two times a day (BID) | ORAL | 0 refills | Status: DC

## 2020-10-30 MED ORDER — ENSURE ENLIVE PO LIQD: 237.0000 mL | Freq: Three times a day (TID) | ORAL | 12 refills | Status: AC

## 2020-10-30 MED ORDER — PREGABALIN 50 MG PO CAPS: 50.0000 mg | ORAL_CAPSULE | Freq: Two times a day (BID) | ORAL | 0 refills | Status: DC

## 2020-10-30 MED ORDER — HEPARIN SOD (PORK) LOCK FLUSH 100 UNIT/ML IV SOLN
500.0000 [IU] | INTRAVENOUS | Status: AC | PRN
  Administered 2020-10-30: 500 [IU]
  Filled 2020-10-30: qty 5

## 2020-10-30 MED ORDER — MIRTAZAPINE 7.5 MG PO TABS: 7.5000 mg | ORAL_TABLET | Freq: Every day | ORAL | 0 refills | Status: DC

## 2020-10-30 MED ORDER — HYDROMORPHONE HCL 4 MG PO TABS: 6.0000 mg | ORAL_TABLET | ORAL | 0 refills | Status: DC

## 2020-10-30 MED ORDER — ACETAMINOPHEN 325 MG PO TABS: 650.0000 mg | ORAL_TABLET | Freq: Four times a day (QID) | ORAL | Status: AC | PRN

## 2020-10-30 NOTE — Progress Notes (Signed)
Family Medicine Teaching Service Daily Progress Note Intern Pager: [phone]  Patient name: OMKAR STRATMANN Medical record number: [id number] Date of birth: 10-Mar-1951 Age: 70 y.o. Gender: male  Primary Care Provider: [name], DO Consultants: ID, Urology, palliative, neuro, IR Code Status: Full  Pt Overview and Major Events to Date:  7/15- admitted  Assessment and Plan: Mr. Berrios is a 70yo male with a hx of metastatic prostate CA, symptomatic anemia, and obstructive uropathy with recurrent UTIs, now with bilateral nephrostomy tubes. He presented with AMS and myoclonic jerking, both now resolved. Found to have Klebsiella bacteremia which we have been treating.   Abdominal Pain  Diarrhea  Encopresis Passed some hard stool mixed with diarrhea yesterday after receiving SMOG enema.  Has had no further diarrhea since that time.  Pain is well controlled this morning this morning. Suspect pain is multifactorial with constipation and cancer both contributing.  He is uncertain of whether he would like a second smog enema prior to discharge - We will offer second SMOG enema to patient, he may refuse.  Can still discharged today - Will discharge on home bowel regimen  - Dilaudid 6mg  q4  UTI  Klebsiella Bacteremia No further fevers for >48 hours. WBCs stable at 16.6.  - Continue Keflex (day 10/12) - Will plan to discharge with cefadroxil to complete course   Metastatic Prostate Cancer Chronic Cancer Pain - Will plan to discharge on scheduled Dilaudid he has been receiving in hospital - Lyrica 50mg  BID - Remeron 7.5mg  qhs  Anemia Hgb 9.6, stable  FEN/GI: Dysphagia 3 diet PPx: Lovenox Dispo:Home today. Barriers include SMOG enema.   Subjective:  Mr. Jarchow reports feeling overall well today.  He says he had a "bad experience" with the administration of the smog enema yesterday but that he did produce hard stool mixed with loose stool.  Has not had any further episodes of diarrhea since  then.  Objective: Temp:  [97.4 F (36.3 C)-99.5 F (37.5 C)] 97.5 F (36.4 C) (07/27 0519) Pulse Rate:  [66-81] 74 (07/27 0519) Resp:  [17-18] 18 (07/27 0519) BP: (123-137)/(67-74) 137/73 (07/27 0519) SpO2:  [98 %-100 %] 100 % (07/27 0519) Weight:  [77.9 kg] 77.9 kg (07/26 2106) Physical Exam: General: Chronically ill-appearing male, lying in bed Cardiovascular: Regular rate, regular rhythm, no murmur, rub, gallop Respiratory: Lungs clear to auscultation bilaterally, normal work of breathing on room air Abdomen: Remains tender to palpation but improved from previous, bowel sounds normal Extremities: Without edema  Laboratory: Recent Labs  Lab 10/28/20 0315 10/29/20 0358 10/30/20 0005  WBC 18.7* 16.3* 16.6*  HGB 9.7* 9.5* 9.6*  HCT 31.3* 30.7* 30.1*  PLT 455* 423* 421*   Recent Labs  Lab 10/24/20 0433 10/25/20 0328 10/28/20 0315 10/29/20 2339  NA 135 134* 132* 132*  K 5.1 5.0 4.1 4.3  CL 104 105 101 101  CO2 23 22 22 22   BUN 25* 34* 43* 48*  CREATININE 1.32* 1.35* 1.33* 1.13  CALCIUM 9.4 9.1 9.2 9.0  PROT 7.7 7.4  --   --   BILITOT 0.5 0.5  --   --   ALKPHOS 130* 127*  --   --   ALT 53* 52*  --   --   AST 30 35  --   --   GLUCOSE 83 111* 128* 109*     Imaging/Diagnostic Tests: No new imaging/tests  Eppie Gibson, MD 10/30/2020, 6:24 AM PGY-1, [place] Intern pager: [phone], text pages welcome

## 2020-10-30 NOTE — Progress Notes (Signed)
Occupational Therapy Treatment Patient Details Name: Andre Glass MRN: [id number] DOB: 09-08-1950 Today's Date: 10/30/2020    History of present illness The pt is a 70 yo male presenting 7/14 with AMS and "jerking" movements. The pt was reccently admitted for obstructive uropathy with bilateral hydronephrosis and is now s/p placement of percutaneous nephrostomy tubes bilaterally. Work up this admission revealed UTI (klebsiella). PMH includes: metastatic adenocarinoma of prostate, chornic pain, HTN, and CKD.   OT comments  Pt received awake and alert, seated on [facility] for toileting with brother assisting. Pt returned back to bed with assistance from brother, OT assisted pt back to [facility] with Mod I supine to sit and min guard stand pivot to [facility]. Min guard pericare in standing with RW. Session focused on toileting and clean up due to constant bowel movements and observance of soiled linen. Pt resented back to recliner with min guard for safety. Pt is progressing toward established goals and will benefit to continue skilled level OT to address established deficits. DC recommendation and frequency remains the same.    Follow Up Recommendations  Home health OT;Supervision - Intermittent    Equipment Recommendations  None recommended by OT (pt has a Secondary school teacher)    Recommendations for Other Services      Precautions / Restrictions Precautions Precautions: Fall Precaution Comments: bilateral nephrostomy Restrictions Weight Bearing Restrictions: No       Mobility Bed Mobility Overal bed mobility: Modified Independent             General bed mobility comments: supine to sit after toileting    Transfers Overall transfer level: Needs assistance Equipment used: None Transfers: Sit to/from Stand;Stand Pivot Transfers Sit to Stand: Min guard Stand pivot transfers: Min guard            Balance Overall balance assessment: Needs assistance Sitting-balance support: No upper extremity  supported;Feet supported Sitting balance-Leahy Scale: Good     Standing balance support: Single extremity supported;During functional activity Standing balance-Leahy Scale: Fair Standing balance comment: able to static stand without assist to don mask                           ADL either performed or assessed with clinical judgement   ADL Overall ADL's : Needs assistance/impaired                         Toilet Transfer: Min guard;Ambulation;RW;Cueing for safety Toilet Transfer Details (indicate cue type and reason): Pt received on toilet with brother at bedside. min guard with RW Toileting- Clothing Manipulation and Hygiene: Min guard;Sit to/from stand Toileting - Clothing Manipulation Details (indicate cue type and reason): pt able to perform pericare with RW with no instability or LOB observed.     Functional mobility during ADLs: Min guard;Cueing for safety General ADL Comments: Session focused on toileting and clean up due to enema.     Vision       Perception     Praxis      Cognition Arousal/Alertness: Awake/alert Behavior During Therapy: WFL for tasks assessed/performed;Flat affect Overall Cognitive Status: Within Functional Limits for tasks assessed                                 General Comments: slowed response        Exercises     Shoulder Instructions  General Comments VSS on RA, brother present at bedside.    Pertinent Vitals/ Pain       Pain Assessment: No/denies pain Pain Location: nephrostomy sites Pain Descriptors / Indicators: Discomfort;Sore Pain Intervention(s): Monitored during session  Home Living                                          Prior Functioning/Environment              Frequency  Min 2X/week        Progress Toward Goals  OT Goals(current goals can now be found in the care plan section)  Progress towards OT goals: Progressing toward goals  Acute Rehab  OT Goals Patient Stated Goal: return home OT Goal Formulation: With patient Time For Goal Achievement: 11/03/20 Potential to Achieve Goals: Good ADL Goals Pt Will Perform Grooming: with modified independence;standing;sitting Pt Will Perform Lower Body Bathing: with modified independence;sit to/from stand Pt Will Transfer to Toilet: with modified independence;ambulating Pt Will Perform Toileting - Clothing Manipulation and hygiene: with modified independence;sit to/from stand  Plan Discharge plan remains appropriate    Co-evaluation                 AM-PAC OT "6 Clicks" Daily Activity     Outcome Measure   Help from another person eating meals?: None Help from another person taking care of personal grooming?: None Help from another person toileting, which includes using toliet, bedpan, or urinal?: A Little Help from another person bathing (including washing, rinsing, drying)?: A Little Help from another person to put on and taking off regular upper body clothing?: None Help from another person to put on and taking off regular lower body clothing?: A Little 6 Click Score: 21    End of Session Equipment Utilized During Treatment: Gait belt  OT Visit Diagnosis: Unsteadiness on feet (R26.81);Muscle weakness (generalized) (M62.81);Pain;History of falling (Z91.81) Pain - Right/Left: Right Pain - part of body: Hip;Leg (drain sites)   Activity Tolerance Patient tolerated treatment well   Patient Left in chair;with call bell/phone within reach;with family/visitor present   Nurse Communication          Time: 4944-9675 OT Time Calculation (min): 23 min  Charges: OT General Charges $OT Visit: 1 Visit OT Treatments $Self Care/Home Management : 23-37 mins  Minus Breeding, MSOT, OTR/L  Supplemental Rehabilitation Services  [phone]    Andre Glass 10/30/2020, 2:31 PM

## 2020-10-30 NOTE — Progress Notes (Signed)
DISCHARGE NOTE HOME Andre Glass to be discharged Home per MD order. Discussed prescriptions and follow up appointments with the patient. Prescriptions given to patient; medication list explained in detail. Patient verbalized understanding.  Skin clean, dry and intact without evidence of skin break down, no evidence of skin tears noted. IV catheter discontinued intact. Site without signs and symptoms of complications. Dressing and pressure applied. Pt denies pain at the site currently. No complaints noted.  Patient free of lines, drains, and wounds.   An After Visit Summary (AVS) was printed and given to the patient. Patient escorted via wheelchair, and discharged home via private auto.  Andre Glass S Andre Minich, RN

## 2020-10-30 NOTE — Discharge Instructions (Addendum)
Dear Andre Glass,  Thank you for letting us participate in your care. You were hospitalized for altered mental status and muscle jerking and diagnosed with a urinary tract infection and bacteria in your blood. You were treated with antibiotics.   POST-HOSPITAL & CARE INSTRUCTIONS You will take your antibiotic, cafadroxil, for two more days, please take following the instructions on the bottle We recommend that you hold off on taking your home laxatives for two days. To avoid further constipation/diarrhea, we recommend that you restart your home laxatives on 7/29.  We reduced your dose of pain medicine while in the hospital. This should help to lessen your constipation. If, however, your pain is not well-controlled, please talk to your primary doctor about increasing your dose of pain medication.  We are stopping your medication called MS Contin. We also changed your medication gabapentin to a similar medication called Lyrica (pregabalin), please start taking the Lyrica instead of the gabapentin. We are sending you home on a medication called Remeron (mirtazapine). You will take this at night to help with your appetite.  Go to your follow up appointments (listed below)   DOCTOR'S APPOINTMENT   Future Appointments  Date Time Provider [place]  11/06/2020  1:45 PM Alen Bleacher, MD [facility]    Follow-up Information     Alen Bleacher, MD. Go on 11/06/2020.   Specialty: Student Why: At 1:45pm. Please arrive by 1:30pm. This is your hospital follow up appointment with your primary care clinic. If this day and time does not work well for you, please call the clinic directly to reschedule. Contact information: [address] [place] [address] [phone]                 Take care and be well!  [facility]  [place], [place] [address] [phone]

## 2020-10-30 NOTE — Discharge Summary (Addendum)
[facility] Discharge Summary  Patient name: Andre Glass Medical record number: [id number] Date of birth: 1951-01-25 Age: 70 y.o. Gender: male Date of Admission: 10/17/2020  Date of Discharge: 10/30/2020 Admitting Physician: Leeanne Rio, MD  Primary Care Provider: [name], DO Consultants: Neurology, Interventional Radiology  Indication for Hospitalization: Altered Mental Status and Myoclonic Jerking  Discharge Diagnoses/Problem List:  Active Problems:   Symptomatic anemia   Altered mental status   Myoclonus   Anemia   Bacteremia due to Klebsiella pneumoniae   Acute cystitis without hematuria   Severe protein-calorie malnutrition ([place])   Abdominal pain   Disposition: Home with home health   Discharge Condition: Stable, at baseline  Discharge Exam:  Vitals:   10/30/20 0519 10/30/20 0836  BP: 137/73 123/70  Pulse: 74 77  Resp: 18 19  Temp: (!) 97.5 F (36.4 C) 97.8 F (36.6 C)  SpO2: 100% 99%  From day's progress note:  General: Chronically ill-appearing male, lying in bed Cardiovascular: Regular rate, regular rhythm, no murmur, rub, gallop Respiratory: Lungs clear to auscultation bilaterally, normal work of breathing on room air Abdomen: Remains tender to palpation but improved from previous, bowel sounds normal Extremities: Without edema   Brief Hospital Course:  [name]. Fullam is a 70 year old male presenting with altered mental status and increased myoclonic jerking.  Past medical history significant for metastatic prostate cancer, symptomatic anemia, recurrent UTI, hypertension, recent admission for obstructive uropathy with bilateral hydronephrosis and placement of bilateral percutaneous nephrostomy tubes.  Altered mental status  myoclonus Patient was brought to the ED on 7/15 when his care giver noted he had increased monoclonal jerking and significant altered mental status.  On admission he was febrile to 102.9 with a  white count of 14.1. CT and MRI showed no acute intracranial abnormality, but did demonstrate mild age-related cerebral atrophy with chronic microvascular ischemic disease. He did have meningeal signs on exam and we were initially concerned for CNS infection, but after consult with neurology, deferred lumbar puncture on admission. Neurology suggested the cause of his myoclonus could be his extensive use of opiates and also gabapentin for pain control as he is a chronic cancer patient.  They suggested to limit his opiate use, and discontinued gabapentin.  An EEG showed evidence of moderate diffuse encephalopathy, nonspecific etiology. His symptoms ultimately improved within four days of transitioning from gabapentin to pregabalin and reducing daily MME while still achieving pain control.   UTI  bacteremia On arrival, [name] was febrile to 102.9, with UA findings consistent with UTI. He also had an elevated Creatinine to 1.66, up from his baseline of 1.22. Urine obtained from his nephrostomy tubes grew up multiple species and he was subsequently found to have Klebsiella bacteremia. He received IV ceftriaxone at 1g daily for one day, which was escalated to 2g daily once his blood culture was positive. Once sensitivities returned, he transitioned to oral Keflex. He was discharged on oral cefadroxil to complete a total of 14 days of antibiotics. Patient was afebrile for >48 hours at the time of discharge.   Diarrhea suspicious for encopresis Abdominal Pain Beginning on hospital day four, [name] had several days of nonbloody watery diarrhea with an increased WBC count. We were initially concerned for infectious process vs. opiate withdrawal given that we had decreased his home dose of opioids as discussed above. He also had increased abdominal pain. He always has abdominal pain at baseline, but felt that this was "different and worse." We  obtained an abdominal ultrasound which did not reveal an identifiable  cause of his pain. GI studies were negative for C. Difficile and no other infectious cause was detected on GI panel. He has a history of chronic constipation related to his chronic opioid use and had moderate stool burden on CT Abd Pelv three days after onset of diarrhea. Given this, we were concered for encopresis. He received two SMOG enemas with passage of hard stool and resolution of his diarrhea and notable improvement in his abdominal pain.     Items for PCP follow-up: 1) Renal US showed thickened bladder with masslike echogenicity concerning for local invasion of prostate CA into bladder. He will need to follow-up with his urologist for this.  2) Sending out on cefadroxil for two days for antibiotics, his last day of treatment should be 7/29 3) We reduced his morphine milligram equivalent opioid dose during inpatient stay, recommend continuing to evaluate if pain control is appropriate 4) Discontinued his MS Contin during hospitalization  5) Discharged on Remeron per patient request to help appetite 6) We discontinued gabapentin in favor of lyrica (patient had myoclonic jerking which resolved after discontinuing gabapentin)    Significant Procedures:  7/22- Nephrostomy Tube Exchange with interventional radiology   Significant Labs and Imaging:  Recent Labs  Lab 10/28/20 0315 10/29/20 0358 10/30/20 0005  WBC 18.7* 16.3* 16.6*  HGB 9.7* 9.5* 9.6*  HCT 31.3* 30.7* 30.1*  PLT 455* 423* 421*   Recent Labs  Lab 10/25/20 0328 10/28/20 0315 10/29/20 2339  NA 134* 132* 132*  K 5.0 4.1 4.3  CL 105 101 101  CO2 22 22 22   GLUCOSE 111* 128* 109*  BUN 34* 43* 48*  CREATININE 1.35* 1.33* 1.13  CALCIUM 9.1 9.2 9.0  ALKPHOS 127*  --   --   AST 35  --   --   ALT 52*  --   --   ALBUMIN 2.2*  --   --    7/14- CT Head  IMPRESSION: No acute intracranial pathology. 7/14- MR Brain IMPRESSION: 1. No acute intracranial abnormality. No convincing structural changes about the  substantia nigra to suggest Parkinson's disease. 2. Mild age-related cerebral atrophy with chronic microvascular ischemic disease. 7/14- Chest X-Ray IMPRESSION: No acute abnormality noted. 7/15- US Abdomen IMPRESSION: 1. Thickened bladder with masslike echogenicity within lumen the bladder. Concern for bladder neoplasm or hematoma. 2. Mild RIGHT hydronephrosis without obstructing lesion identified. 3. RIGHT kidney and bladder findings similar to CT 09/28/2020 with high concern for advanced prostate carcinoma with local invasion of the bladder. 4. LEFT kidney normal. 5. Liver and gallbladder normal. 7/22- CT Abd Pelvis IMPRESSION: 1. Metastatic locally invasive prostate cancer with progressive adenopathy in the pelvis and retroperitoneum. 2. Bilateral nephrostomy tubes in place. Slight prominence of the renal collecting systems without frank hydronephrosis. Heterogeneous renal enhancement with slight enhancement of the right renal pelvis, recommend correlation with nephrostomy output to assess for urinary tract infection. 3. Diffuse bladder wall thickening with perivesicular fat stranding. 4. Multifocal sclerotic osseous metastasis. 5. Small subcutaneous density in the left anterior abdominal wall soft tissues may be related to medication injection site or a soft tissue nodule.  Results/Tests Pending at Time of Discharge: None  Discharge Medications:  Allergies as of 10/30/2020       Reactions   Ibuprofen Anaphylaxis   Shrimp [shellfish Allergy] Anaphylaxis        Medication List     STOP taking these medications    gabapentin 100 MG  capsule Commonly known as: NEURONTIN   morphine 60 MG 12 hr tablet Commonly known as: MS CONTIN       TAKE these medications    acetaminophen 325 MG tablet Commonly known as: TYLENOL Take 2 tablets (650 mg total) by mouth every 6 (six) hours as needed for headache.   amLODipine 10 MG tablet Commonly known as: NORVASC Take 1  tablet (10 mg total) by mouth daily.   cefadroxil 500 MG capsule Commonly known as: DURICEF Take 1 capsule (500 mg total) by mouth 2 (two) times daily.   feeding supplement Liqd Take 237 mLs by mouth 3 (three) times daily between meals.   HYDROmorphone 4 MG tablet Commonly known as: DILAUDID Take 1.5 tablets (6 mg total) by mouth every 4 (four) hours. What changed:  how much to take when to take this reasons to take this   lidocaine-prilocaine cream Commonly known as: EMLA Apply 1 application topically as needed. What changed: reasons to take this   linaclotide 290 MCG Caps capsule Commonly known as: LINZESS Take 1 capsule (290 mcg total) by mouth daily as needed (constipation, take before breakfast).   mirtazapine 7.5 MG tablet Commonly known as: REMERON Take 1 tablet (7.5 mg total) by mouth at bedtime.   multivitamin with minerals Tabs tablet Take 1 tablet by mouth daily.   naloxegol oxalate 12.5 MG Tabs tablet Commonly known as: MOVANTIK Take 1 tablet (12.5 mg total) by mouth daily. Take it in the morning 1 hour before breakfast daily for constipation.   ondansetron 4 MG tablet Commonly known as: ZOFRAN Take 1 tablet (4 mg total) by mouth 3 (three) times daily as needed for nausea or vomiting.   polyethylene glycol 17 g packet Commonly known as: MiraLax Take 17 g by mouth 2 (two) times daily. What changed:  when to take this reasons to take this   predniSONE 5 MG tablet Commonly known as: DELTASONE TAKE 1 TABLET BY MOUTH EVERY DAY WITH BREAKFAST What changed: See the new instructions.   pregabalin 50 MG capsule Commonly known as: LYRICA Take 1 capsule (50 mg total) by mouth 2 (two) times daily.   prochlorperazine 10 MG tablet Commonly known as: COMPAZINE Take 1 tablet (10 mg total) by mouth every 6 (six) hours as needed for nausea or vomiting.   senna-docusate 8.6-50 MG tablet Commonly known as: Senokot-S Take 2 tablets by mouth daily.   tamsulosin  0.4 MG Caps capsule Commonly known as: FLOMAX Take 1 capsule (0.4 mg total) by mouth daily as needed (to help with urination).   Trulance 3 MG Tabs Generic drug: Plecanatide Take 3 mg by mouth daily.   vitamin C 500 MG tablet Commonly known as: ASCORBIC ACID Take 1,000 mg by mouth daily.   Vitamin E 268 MG (400 UNIT) Chew Chew 1 tablet by mouth daily.        Discharge Instructions: Please refer to Patient Instructions section of EMR for full details.  Patient was counseled important signs and symptoms that should prompt return to medical care, changes in medications, dietary instructions, activity restrictions, and follow up appointments.   Follow-Up Appointments:  Follow-up Information     Alen Bleacher, MD. Go on 11/06/2020.   Specialty: Student Why: At 1:45pm. Please arrive by 1:30pm. This is your hospital follow up appointment with your primary care clinic. If this day and time does not work well for you, please call the clinic directly to reschedule. Contact information: [place] [address] [phone]  Eppie Gibson, MD 10/30/2020, 3:16 PM PGY-1, [facility]  Upper Level Addendum: I have reviewed the above note, making necessary revisions as appropriate. These are denoted by green text. I agree with the medical decision making and physical exam as noted above. Ezequiel Essex, MD PGY-2 [facility]

## 2020-10-31 ENCOUNTER — Telehealth (HOSPITAL_COMMUNITY): Payer: Self-pay

## 2020-10-31 LAB — CULTURE, BLOOD (ROUTINE X 2)
Culture: NO GROWTH
Culture: NO GROWTH
Special Requests: ADEQUATE

## 2020-10-31 NOTE — Telephone Encounter (Signed)
Called to reschedule stent placement, no answer, left vm. AW

## 2020-11-01 ENCOUNTER — Telehealth: Payer: Self-pay

## 2020-11-01 NOTE — Telephone Encounter (Signed)
Jim from [facility] calling for PT verbal orders as follows:  1 time(s) weekly for 1 week(s), then 2 time(s) weekly for 5 week(s)  Verbal orders given per [facility] protocol  Talbot Grumbling, RN

## 2020-11-01 NOTE — Telephone Encounter (Signed)
PT calls nurse line to report multiple findings since patient has been discharged from the hospital.   - Reports that patient has bilateral nephrostomy tubes. States that left tube continues to leak.   -Reports uncontrolled pain. Patient reports pain at 8/10   -Reports last BM 7/26. Patient has history of constipation and needing enemas in order to have BM.   -BP at today's visit 152/82 (sitting) 125/82 (standing). Patient does report intermittent lightheadedness.   -Lastly, patient is unable to afford Linzess.   Spoke with Dr. Ky Barban regarding patient. Provided with the following recommendations.   Advised that patient contact urologist for concerns with leaking nephrostomy tubes.   For constipation, recommended continued bowel regimen. If patient begins having abdominal pain or distention, he will go to the ED for further evaluation.   Called patient. Patient requested mychart message be sent to him regarding current recommendations. Sent mychart message to patient.   Provided with ED precautions.   Talbot Grumbling, RN

## 2020-11-02 ENCOUNTER — Emergency Department (HOSPITAL_COMMUNITY): Payer: Medicare Other

## 2020-11-02 ENCOUNTER — Emergency Department (HOSPITAL_COMMUNITY)
Admission: EM | Admit: 2020-11-02 | Discharge: 2020-11-02 | Disposition: A | Discharge status: Home / Self Care | Payer: Medicare Other | Attending: Emergency Medicine | Admitting: Emergency Medicine

## 2020-11-02 ENCOUNTER — Encounter (HOSPITAL_COMMUNITY): Payer: Self-pay

## 2020-11-02 ENCOUNTER — Other Ambulatory Visit: Payer: Self-pay

## 2020-11-02 DIAGNOSIS — G8929 Other chronic pain: Secondary | ICD-10-CM | POA: Diagnosis not present

## 2020-11-02 DIAGNOSIS — N1330 Unspecified hydronephrosis: Secondary | ICD-10-CM | POA: Insufficient documentation

## 2020-11-02 DIAGNOSIS — Y733 Surgical instruments, materials and gastroenterology and urology devices (including sutures) associated with adverse incidents: Secondary | ICD-10-CM | POA: Diagnosis not present

## 2020-11-02 DIAGNOSIS — T83022A Displacement of nephrostomy catheter, initial encounter: Secondary | ICD-10-CM | POA: Diagnosis present

## 2020-11-02 DIAGNOSIS — Z8546 Personal history of malignant neoplasm of prostate: Secondary | ICD-10-CM | POA: Insufficient documentation

## 2020-11-02 DIAGNOSIS — Z20822 Contact with and (suspected) exposure to covid-19: Secondary | ICD-10-CM | POA: Insufficient documentation

## 2020-11-02 DIAGNOSIS — K5903 Drug induced constipation: Secondary | ICD-10-CM | POA: Diagnosis not present

## 2020-11-02 HISTORY — PX: IR NEPHROSTOMY PLACEMENT RIGHT: IMG6064

## 2020-11-02 HISTORY — PX: IR NEPHROSTOMY PLACEMENT LEFT: IMG6063

## 2020-11-02 LAB — CBC WITH DIFFERENTIAL/PLATELET
Abs Immature Granulocytes: 0.12 10*3/uL — ABNORMAL HIGH (ref 0.00–0.07)
Basophils Absolute: 0 10*3/uL (ref 0.0–0.1)
Basophils Relative: 0 %
Eosinophils Absolute: 0.1 10*3/uL (ref 0.0–0.5)
Eosinophils Relative: 1 %
HCT: 30.1 % — ABNORMAL LOW (ref 39.0–52.0)
Hemoglobin: 9.4 g/dL — ABNORMAL LOW (ref 13.0–17.0)
Immature Granulocytes: 1 %
Lymphocytes Relative: 9 %
Lymphs Abs: 1.3 10*3/uL (ref 0.7–4.0)
MCH: 28.7 pg (ref 26.0–34.0)
MCHC: 31.2 g/dL (ref 30.0–36.0)
MCV: 92 fL (ref 80.0–100.0)
Monocytes Absolute: 1 10*3/uL (ref 0.1–1.0)
Monocytes Relative: 7 %
Neutro Abs: 12.6 10*3/uL — ABNORMAL HIGH (ref 1.7–7.7)
Neutrophils Relative %: 82 %
Platelets: 385 10*3/uL (ref 150–400)
RBC: 3.27 MIL/uL — ABNORMAL LOW (ref 4.22–5.81)
RDW: 15.5 % (ref 11.5–15.5)
WBC: 15.2 10*3/uL — ABNORMAL HIGH (ref 4.0–10.5)
nRBC: 0 % (ref 0.0–0.2)

## 2020-11-02 LAB — COMPREHENSIVE METABOLIC PANEL
ALT: 52 U/L — ABNORMAL HIGH (ref 0–44)
AST: 31 U/L (ref 15–41)
Albumin: 2.5 g/dL — ABNORMAL LOW (ref 3.5–5.0)
Alkaline Phosphatase: 127 U/L — ABNORMAL HIGH (ref 38–126)
Anion gap: 11 (ref 5–15)
BUN: 47 mg/dL — ABNORMAL HIGH (ref 8–23)
CO2: 24 mmol/L (ref 22–32)
Calcium: 9.6 mg/dL (ref 8.9–10.3)
Chloride: 104 mmol/L (ref 98–111)
Creatinine, Ser: 1.11 mg/dL (ref 0.61–1.24)
GFR, Estimated: >60 mL/min (ref >60)
Glucose, Bld: 107 mg/dL — ABNORMAL HIGH (ref 70–99)
Potassium: 4.5 mmol/L (ref 3.5–5.1)
Sodium: 139 mmol/L (ref 135–145)
Total Bilirubin: 0.5 mg/dL (ref 0.3–1.2)
Total Protein: 8.1 g/dL (ref 6.5–8.1)

## 2020-11-02 LAB — RESP PANEL BY RT-PCR (FLU A&B, COVID) ARPGX2
Influenza A by PCR: NEGATIVE
Influenza B by PCR: NEGATIVE
SARS Coronavirus 2 by RT PCR: NEGATIVE

## 2020-11-02 LAB — LIPASE, BLOOD: Lipase: 25 U/L (ref 11–51)

## 2020-11-02 MED ORDER — HEPARIN SOD (PORK) LOCK FLUSH 100 UNIT/ML IV SOLN
500.0000 [IU] | Freq: Once | INTRAVENOUS | Status: AC
  Administered 2020-11-02: 500 [IU]
  Filled 2020-11-02: qty 5

## 2020-11-02 MED ORDER — FENTANYL CITRATE (PF) 100 MCG/2ML IJ SOLN
INTRAMUSCULAR | Status: AC
  Filled 2020-11-02: qty 2

## 2020-11-02 MED ORDER — IOHEXOL 300 MG/ML  SOLN
50.0000 mL | Freq: Once | INTRAMUSCULAR | Status: AC | PRN
  Administered 2020-11-02: 50 mL

## 2020-11-02 MED ORDER — IOHEXOL 300 MG/ML  SOLN
50.0000 mL | Freq: Once | INTRAMUSCULAR | Status: AC | PRN
  Administered 2020-11-02: 5 mL

## 2020-11-02 MED ORDER — FENTANYL CITRATE (PF) 100 MCG/2ML IJ SOLN
INTRAMUSCULAR | Status: AC | PRN
  Administered 2020-11-02 (×2): 50 ug via INTRAVENOUS
  Administered 2020-11-02: 25 ug via INTRAVENOUS

## 2020-11-02 MED ORDER — MIDAZOLAM HCL 2 MG/2ML IJ SOLN
INTRAMUSCULAR | Status: AC
  Filled 2020-11-02: qty 2

## 2020-11-02 MED ORDER — LIDOCAINE HCL (PF) 1 % IJ SOLN
INTRAMUSCULAR | Status: AC | PRN
  Administered 2020-11-02 (×2): 10 mL via INTRADERMAL

## 2020-11-02 MED ORDER — LIDOCAINE HCL 1 % IJ SOLN
INTRAMUSCULAR | Status: AC
  Filled 2020-11-02: qty 20

## 2020-11-02 MED ORDER — SODIUM CHLORIDE 0.9 % IV SOLN: INTRAVENOUS | Status: DC

## 2020-11-02 MED ORDER — MIDAZOLAM HCL 2 MG/2ML IJ SOLN
INTRAMUSCULAR | Status: AC | PRN
  Administered 2020-11-02 (×2): 1 mg via INTRAVENOUS

## 2020-11-02 NOTE — ED Notes (Signed)
Patient transported to IR 

## 2020-11-02 NOTE — ED Provider Notes (Signed)
[facility] DEPT Provider Note   CSN: [id number] Arrival date & time: 11/02/20  1227     History No chief complaint on file.   Andre Glass is a 70 y.o. male.  HPI     This is a 70 year old male with a history of metastatic prostate cancer, recurrent UTIs, recent Klebsiella pneumonia bacteremia, bilateral nephrostomy tubes who presents with one of his nephrostomy tubes pulled out.  Patient reports that last night his left nephrostomy tube came out.  Prior to that it had been leaking.  He was recently in the hospital and just discharged on the 27th after being admitted for altered mental status.  At that time he was found to have a UTI and had blood cultures positive for Klebsiella pneumonia.  He was discharged on cephalosporin.  He reports he is taking his antibiotics.  He denies fevers at home.  Overall he states he "is getting better."  He does report some abdominal pain.  He at baseline has chronic abdominal pain and constipation.  He states that his pain has worsened some over the last 1 to 2 days.  It is diffuse and nonradiating.  Not had any vomiting.  His recent medication regimen was changed secondary to concerns that narcotic medication was contributing to his altered mental status as well as his worsening constipation.  He continues to take Dilaudid at home but no longer takes morphine.  He also no longer takes gabapentin.  Patient had right nephrostomy tube replaced while in the hospital.  Patient n.p.o. since last night.  Past Medical History:  Diagnosis Date   Anemia    History of kidney stones    Prostate cancer ([place])    UTI (urinary tract infection)     Patient Active Problem List   Diagnosis Date Noted   Abdominal pain    Severe protein-calorie malnutrition ([place]) 10/21/2020   Acute cystitis without hematuria    Anemia    Bacteremia due to Klebsiella pneumoniae    Altered mental status 10/18/2020   Myoclonus    Symptomatic anemia  10/17/2020   AKI (acute kidney injury) ([place]) 09/28/2020   Hydronephrosis 09/28/2020   Obstructive uropathy 09/28/2020   Intractable pain 01/18/2020   BPH (benign prostatic hyperplasia) 11/08/2019   Catheter-associated urinary tract infection ([place]) 09/14/2019   Drug induced constipation 07/18/2019   Goals of care, counseling/discussion 06/26/2019   Metastatic castration-resistant adenocarcinoma of prostate ([place]) 04/19/2019    Past Surgical History:  Procedure Laterality Date   IR IMAGING GUIDED PORT INSERTION  01/31/2020   IR NEPHROSTOMY EXCHANGE RIGHT  10/25/2020   IR NEPHROSTOMY PLACEMENT LEFT  09/29/2020   IR NEPHROSTOMY PLACEMENT RIGHT  09/29/2020   TRANSURETHRAL RESECTION OF PROSTATE N/A 11/08/2019   Procedure: TRANSURETHRAL RESECTION OF THE PROSTATE (TURP);  Surgeon: [name], MD;  Location: [facility];  Service: Urology;  Laterality: N/A;       Family History  Problem Relation Age of Onset   Stomach cancer Mother    Pancreatic cancer Sister    Breast cancer Neg Hx    Prostate cancer Neg Hx    Colon cancer Neg Hx     Social History   Tobacco Use   Smoking status: Never   Smokeless tobacco: Never  Vaping Use   Vaping Use: Never used  Substance Use Topics   Alcohol use: Never   Drug use: Never    Home Medications Prior to Admission medications   Medication Sig Start Date End  Date Taking? Authorizing Provider  acetaminophen (TYLENOL) 325 MG tablet Take 2 tablets (650 mg total) by mouth every 6 (six) hours as needed for headache. 10/30/20   Ezequiel Essex, MD  amLODipine (NORVASC) 10 MG tablet Take 1 tablet (10 mg total) by mouth daily. 10/05/20   Rai, Vernelle Emerald, MD  cefadroxil (DURICEF) 500 MG capsule Take 1 capsule (500 mg total) by mouth 2 (two) times daily. 10/30/20   Ezequiel Essex, MD  feeding supplement (ENSURE ENLIVE / ENSURE PLUS) LIQD Take 237 mLs by mouth 3 (three) times daily between meals. 10/30/20   Ezequiel Essex, MD  HYDROmorphone (DILAUDID) 4 MG  tablet Take 1.5 tablets (6 mg total) by mouth every 4 (four) hours. 10/30/20 11/29/20  Ezequiel Essex, MD  lidocaine-prilocaine (EMLA) cream Apply 1 application topically as needed. 01/15/20   [name], MD  linaclotide (LINZESS) 290 MCG CAPS capsule Take 1 capsule (290 mcg total) by mouth daily as needed (constipation, take before breakfast). 10/05/20   Rai, Ripudeep K, MD  mirtazapine (REMERON) 7.5 MG tablet Take 1 tablet (7.5 mg total) by mouth at bedtime. 10/30/20   Ezequiel Essex, MD  Multiple Vitamin (MULTIVITAMIN WITH MINERALS) TABS tablet Take 1 tablet by mouth daily.    [provider]  naloxegol oxalate (MOVANTIK) 12.5 MG TABS tablet Take 1 tablet (12.5 mg total) by mouth daily. Take it in the morning 1 hour before breakfast daily for constipation. 10/05/20   Rai, Vernelle Emerald, MD  ondansetron (ZOFRAN) 4 MG tablet Take 1 tablet (4 mg total) by mouth 3 (three) times daily as needed for nausea or vomiting. 09/24/20   [name], MD  Plecanatide (TRULANCE) 3 MG TABS Take 3 mg by mouth daily. 08/08/20   [name], DO  polyethylene glycol (MIRALAX) 17 g packet Take 17 g by mouth 2 (two) times daily. 10/12/20   Carmin Muskrat, MD  predniSONE (DELTASONE) 5 MG tablet TAKE 1 TABLET BY MOUTH EVERY DAY WITH BREAKFAST 10/18/20   [name], MD  pregabalin (LYRICA) 50 MG capsule Take 1 capsule (50 mg total) by mouth 2 (two) times daily. 10/30/20 12/29/20  Ezequiel Essex, MD  prochlorperazine (COMPAZINE) 10 MG tablet Take 1 tablet (10 mg total) by mouth every 6 (six) hours as needed for nausea or vomiting. 09/24/20   [name], MD  senna-docusate (SENOKOT-S) 8.6-50 MG tablet Take 2 tablets by mouth daily. 10/12/20 11/11/20  Carmin Muskrat, MD  tamsulosin (FLOMAX) 0.4 MG CAPS capsule Take 1 capsule (0.4 mg total) by mouth daily as needed (to help with urination). 10/24/20   [name], DO  vitamin C (ASCORBIC ACID) 500 MG tablet Take 1,000 mg by mouth daily.    [provider]   Vitamin E 268 MG (400 UNIT) CHEW Chew 1 tablet by mouth daily.    [provider]    Allergies    Ibuprofen and Shrimp [shellfish allergy]  Review of Systems   Review of Systems  Constitutional:  Negative for fever.  Respiratory:  Negative for shortness of breath.   Cardiovascular:  Negative for chest pain.  Gastrointestinal:  Positive for abdominal pain and diarrhea. Negative for nausea and vomiting.  Genitourinary:  Negative for flank pain.  All other systems reviewed and are negative.  Physical Exam Updated Vital Signs BP (!) 148/77 (BP Location: Right Arm)   Pulse 74   Temp (!) 97.4 F (36.3 C) (Oral)   Resp 18   SpO2 100%   Physical Exam Vitals and  nursing note reviewed.  Constitutional:      Appearance: He is well-developed.     Comments: Chronically ill-appearing but nontoxic, no acute distress  HENT:     Head: Normocephalic and atraumatic.     Mouth/Throat:     Mouth: Mucous membranes are dry.  Eyes:     Pupils: Pupils are equal, round, and reactive to light.  Cardiovascular:     Rate and Rhythm: Normal rate and regular rhythm.     Heart sounds: Normal heart sounds. No murmur heard. Pulmonary:     Effort: Pulmonary effort is normal. No respiratory distress.     Breath sounds: Normal breath sounds. No wheezing.  Abdominal:     General: Bowel sounds are normal.     Palpations: Abdomen is soft.     Tenderness: There is abdominal tenderness. There is no guarding or rebound.     Comments: Diffuse tenderness to palpation, normal bowel sounds, no rebound or guarding, no distention Right-sided nephrostomy tube in place with clear urine draining into bag Left nephrostomy tube dislodged, nephrostomy site with leakage of urine noted, no adjacent erythema or fluctuance  Musculoskeletal:     Cervical back: Neck supple.     Right lower leg: No edema.     Left lower leg: No edema.  Lymphadenopathy:     Cervical: No cervical adenopathy.  Skin:    General:  Skin is warm and dry.  Neurological:     Mental Status: He is alert and oriented to person, place, and time.    ED Results / Procedures / Treatments   Labs (all labs ordered are listed, but only abnormal results are displayed) Labs Reviewed  CBC WITH DIFFERENTIAL/PLATELET - Abnormal; Notable for the following components:      Result Value   WBC 15.2 (*)    RBC 3.27 (*)    Hemoglobin 9.4 (*)    HCT 30.1 (*)    Neutro Abs 12.6 (*)    Abs Immature Granulocytes 0.12 (*)    All other components within normal limits  COMPREHENSIVE METABOLIC PANEL - Abnormal; Notable for the following components:   Glucose, Bld 107 (*)    BUN 47 (*)    Albumin 2.5 (*)    ALT 52 (*)    Alkaline Phosphatase 127 (*)    All other components within normal limits  RESP PANEL BY RT-PCR (FLU A&B, COVID) ARPGX2  LIPASE, BLOOD    EKG None  Radiology DG Abd 1 View  Result Date: 11/02/2020 CLINICAL DATA:  Assess position of nephrostomy drains EXAM: ABDOMEN - 1 VIEW COMPARISON:  CT abdomen pelvis 10/25/2020 FINDINGS: No dilated loop of bowel to indicate ileus or obstruction. Diffuse osseous metastatic disease again seen. Right nephrostomy tube has been retracted. The left nephrostomy tube is absent. IMPRESSION: Retracted right nephrostomy tube. No left nephrostomy tube identified. Electronically Signed   By: Miachel Roux M.D.   On: 11/02/2020 14:27    Procedures Procedures   Medications Ordered in ED Medications  0.9 %  sodium chloride infusion ( Intravenous Stopped 11/02/20 1646)  lidocaine (XYLOCAINE) 1 % (with pres) injection (has no administration in time range)  heparin lock flush 100 unit/mL (has no administration in time range)  midazolam (VERSED) injection (1 mg Intravenous Given 11/02/20 1528)  fentaNYL (SUBLIMAZE) injection ( Intravenous Canceled Entry 11/02/20 1545)  lidocaine (PF) (XYLOCAINE) 1 % injection (10 mLs Intradermal Given 11/02/20 1543)  iohexol (OMNIPAQUE) 300 MG/ML solution 50 mL (5  mLs Other Contrast Given 11/02/20  1547)  iohexol (OMNIPAQUE) 300 MG/ML solution 50 mL (50 mLs Other Contrast Given 11/02/20 1547)    ED Course  I have reviewed the triage vital signs and the nursing notes.  Pertinent labs & imaging results that were available during my care of the patient were reviewed by me and considered in my medical decision making (see chart for details).  Clinical Course as of 11/02/20 1647  Sat Nov 02, 2020  1320 Briefly discussed with Dr. Jeffie Pollock.  Recommends consulting IR for nephrostomy tube replacement. [OI]  3254 Interventional radiology consulted.  Nephrostomy tube order placed.  Patient is currently NPO.  He is not on any anticoagulants. [DI]  2641 RAXEN with interventional radiologist.  He will plan for placement. [CH]    Clinical Course User Index [CH] Kylee Umana, Barbette Hair, MD   MDM Rules/Calculators/A&P                           Patient presents with dislodged left nephrostomy tube.  Recently had the right one replaced.  He was just discharged from the hospital.  He reports ongoing abdominal pain.  This is a chronic issue for him as his constipation.  He had multiple bowel movements by enema prior to being discharged from the hospital.  He has some tenderness of the abdomen but no signs of peritonitis.  Left nephrostomy tube is fully displaced.  Right appears in place with some urine collecting in the bag.  Labs obtained.  Overall, labs appear very reassuring.  White count is slightly downtrending.  Otherwise metabolic labs are stable.  Has consulted with IR who will evaluate the patient for replacement of his nephrostomy tubes.  Regarding his abdominal pain, feel likely his ongoing constipation is the culprit.  Recommend ongoing bowel regimen at home.  He continues to be on narcotic pain medication although changed in lower daily potency which should help.  Patient did have an abdominal x-ray by IR which showed that the right tube was also retracted.  He had  bilateral placements.  On recheck, he states he feels fine.  He is ready to go home.  He is without complaint.  Do not feel he needs any further work-up.  Continue antibiotics at home per discharge instructions.  After history, exam, and medical workup I feel the patient has been appropriately medically screened and is safe for discharge home. Pertinent diagnoses were discussed with the patient. Patient was given return precautions.  Final Clinical Impression(s) / ED Diagnoses Final diagnoses:  Hydronephrosis  Nephrostomy tube displaced ([place])  Drug-induced constipation    Rx / DC Orders ED Discharge Orders     None        Merryl Hacker, MD 11/02/20 1649

## 2020-11-02 NOTE — Discharge Instructions (Addendum)
You were seen today for dislodged nephrostomy tubes.  These were replaced by interventional radiology.  Follow-up with your primary doctor.  Regarding your constipation, continue your bowel regimen.  Follow-up with your primary physician.

## 2020-11-02 NOTE — Procedures (Signed)
Interventional Radiology Procedure Note  Procedure: bilateral pcns are out and unable to replace thru existing tracts  Bilateral new stick 10 fr pcns placed    Complications: None  Estimated Blood Loss:  min  Findings: Full report in pacs     Tamera Punt, MD

## 2020-11-02 NOTE — ED Triage Notes (Addendum)
Patient coming from home with c/o bilateral  nephrostomy tube coming out and generalized weakness. Patient have hx of cancer.

## 2020-11-06 ENCOUNTER — Other Ambulatory Visit: Payer: Self-pay

## 2020-11-06 ENCOUNTER — Encounter: Payer: Self-pay | Admitting: Student

## 2020-11-06 ENCOUNTER — Ambulatory Visit (INDEPENDENT_AMBULATORY_CARE_PROVIDER_SITE_OTHER): Payer: Medicare Other | Admitting: Student

## 2020-11-06 VITALS — BP 136/70 | HR 85

## 2020-11-06 DIAGNOSIS — D649 Anemia, unspecified: Secondary | ICD-10-CM | POA: Diagnosis present

## 2020-11-06 DIAGNOSIS — K5903 Drug induced constipation: Secondary | ICD-10-CM

## 2020-11-06 MED ORDER — POLYETHYLENE GLYCOL 3350 17 G PO PACK: 17.0000 g | PACK | Freq: Two times a day (BID) | ORAL | 0 refills | Status: DC

## 2020-11-06 NOTE — Progress Notes (Addendum)
    SUBJECTIVE:   CHIEF COMPLAINT / HPI:  Hospitalization follow up  Andre Glass was admitted at [facility] hospital 7/14 for AMS and  myoclonic jerk. Patient is accompanied by his caregiver. He has no complaint of confusion or altered mental status. He reports that he started having the myoclonic jerk again which is more pronounced when he is holding a cup or trying to use his phone. Patient said the jerk is reduced compared to the severity before his hospitalization.   Constipation for 6 days Andre Glass said he has been having constipation and no bowel movement in the last 6 days. He said he has lower abdominal pain but denies nausea or vomiting. He reports having chronic constipation due to hie pain medication. Patient said he couldn't get Andre Glass which was prescribed for constipation. He said it's too expensive and insurance will not cover the cost.  Difficult swallowing Patient reports having difficulty chewing or swallowing.He was supposed to be scheduled for an SLP evaluation by occupational therapist yesterday but OT didn't call him to schedule the appointment. He said he has been eating puree food the last 3 days due to difficulty swallowing.   Generalized weakness Patient complained of worsening weakness which he said is worse compared to his baseline from the time of discharge at the hospital. He denies any dizziness or SOB.   PERTINENT  PMH / PSH:  Past Medical History:  Diagnosis Date   Anemia    History of kidney stones    Prostate cancer ([place])    UTI (urinary tract infection)      Review of Systems  Constitutional:  Positive for malaise/fatigue. Negative for chills and fever.  Respiratory:  Negative for cough, hemoptysis and shortness of breath.   Cardiovascular:  Negative for chest pain, palpitations and leg swelling.  Gastrointestinal:  Positive for abdominal pain and constipation. Negative for nausea and vomiting.  Musculoskeletal:  Positive for back pain, joint pain and  neck pain.  Neurological:  Positive for tremors and weakness. Negative for dizziness, tingling and headaches.    OBJECTIVE:   BP 136/70   Pulse 85   SpO2 100%    Physical Exam General: Alert, frail appearing elderly man, oriented x4 Cardiovascular:RRR, No murmurs Respiratory: CTAB, Non labored breathing on room air Abdomen: soft, non distended, diffused tenderness  Extremities:No Edema on extremities    ASSESSMENT/PLAN:  70 year old male who presented for hospitalization follow up.  Myoclonic Jerk Patient was recently hospitalized for myoclonic jerk where he was seen by a neurologist who switched his medication from Gabapentin to Lyrica dues to concerns that the jerk might be caused by Gabapentin. Patient report the jerk to be significantly reduced on Lyrica -will continue monitoring the jerk and should schedule an appointment if it progressively gets worse.  Constipation Patient on significant amount of pain medication for chronic pain with history of prostate cancer reports continued constipation and no bowel movement in 6 days. No episodes of vomiting. -Ordered Miralax to be taken once a day and if no improvement patient should increase frequency to twice a day.  Generalized weakness Patient with current diagnosis of metastatic prostate cancer, Chronic anemia and significant amount of daily dose of dilaudid. On physical exam he has pale skin and sclera which is concerning for anemia. -Ordered CBC  -recommended early follow up with oncologist and urologist.  Andre Bleacher, MD [facility]

## 2020-11-06 NOTE — Patient Instructions (Addendum)
Mr. Azzara, It was wonderful seeing you today. Thank you for allowing me to be a part of your care. Below is a short summary of what we discussed at your visit today:   You reported having constipation with no bowel movement in the last 6 days. You have lower abdominal pain but have denied vomiting.  In your last hospital visit, Linzess was prescribed to alleviate your constipation however you unable to have that filled because it is expensive and your insurance company does not cover the cost for that.  Today, we will prescribe MiraLAX to help with the constipation.  For you Miralax, I recommend you take it once a day and if no improvement, increase the frequency.  You said you continue to have myoclonic jerk and it is decreased compared to episode prior to your hospitalization.  I explained this could be attributed to your pain medications.  And we will continue monitoring that.  You reported continued generalized weakness which you  indicated has gotten worse over time.  I explained that this could be multifactorial owing to your history of anemia, prostate cancer  and due to pain medication of Dilaudid.  Today we will collect blood samples to do a complete blood count and check your hemoglobin level.  I strongly recommend that you follow-up with your oncology and urology appointment.  Try to get in to see them as soon as possible.  If you have any questions or concerns, please do not hesitate to contact us via phone or MyChart message.   Alen Bleacher, MD [facility]

## 2020-11-07 ENCOUNTER — Telehealth (HOSPITAL_COMMUNITY): Payer: Self-pay

## 2020-11-07 ENCOUNTER — Telehealth: Payer: Self-pay | Admitting: Student

## 2020-11-07 LAB — CBC
Hematocrit: 28.9 % — ABNORMAL LOW (ref 37.5–51.0)
Hemoglobin: 9.2 g/dL — ABNORMAL LOW (ref 13.0–17.7)
MCH: 28.2 pg (ref 26.6–33.0)
MCHC: 31.8 g/dL (ref 31.5–35.7)
MCV: 89 fL (ref 79–97)
Platelets: 303 10*3/uL (ref 150–450)
RBC: 3.26 x10E6/uL — ABNORMAL LOW (ref 4.14–5.80)
RDW: 14.4 % (ref 11.6–15.4)
WBC: 15.9 10*3/uL — ABNORMAL HIGH (ref 3.4–10.8)

## 2020-11-07 NOTE — Telephone Encounter (Signed)
Called to schedule stent placement, no answer, left vm. AW

## 2020-11-08 ENCOUNTER — Telehealth: Payer: Self-pay | Admitting: Student

## 2020-11-08 NOTE — Telephone Encounter (Signed)
Called Andre Glass at 3:42 PM on 11/08/2020 to discuss his abnormal labs.  In the conversation I explained to Andre Glass his labs were abnormal but nothing significantly concerning compared to his baseline.  His hemoglobin is low at 9.2 and his white blood cell count was high at 15.9; these are within his baseline.  Patient said he is still feeling tired and did have a small bowel movement on MiraLAX.  I encourage patient to continue to take his MiraLAX.

## 2020-11-12 ENCOUNTER — Telehealth: Payer: Self-pay

## 2020-11-12 ENCOUNTER — Other Ambulatory Visit: Payer: Self-pay | Admitting: Family Medicine

## 2020-11-12 DIAGNOSIS — R1310 Dysphagia, unspecified: Secondary | ICD-10-CM

## 2020-11-12 NOTE — Progress Notes (Signed)
Order for modified verified swallow study placed per speech therapist.

## 2020-11-12 NOTE — Telephone Encounter (Signed)
Received phone call from [place] therapist regarding patient. Patient continues to have issues with constipation. Last BM was very small and was around one week ago.   Patient is currently taking miralax and Senna daily. Patient denies worsening of abdominal pain, states that he has chronic abdominal pain. Also denies nausea or vomiting. ST states that abdomen is soft and non-distended.   ST is also requesting a modified barium swallow study for patient due to dysphagia. Order will need to be faxed to [facility] at [phone]. She is also requesting order to be placed as STAT.   Please advise additional recommendations for constipation.  Please route back to "RN team" once order has been placed for processing.   Talbot Grumbling, RN

## 2020-11-13 ENCOUNTER — Telehealth: Payer: Self-pay | Admitting: *Deleted

## 2020-11-13 MED FILL — Dexamethasone Sodium Phosphate Inj 100 MG/10ML: INTRAMUSCULAR | Qty: 1 | Status: AC

## 2020-11-13 NOTE — Telephone Encounter (Signed)
Andre Glass, PT with [facility] LVM r/t Andre Glass. Andre Glass  saw Andre Glass today and wanted Dr. [name] to be informed that patient had last BM on 11/07/20. Patient takes several medications to assist with moving bowels, but patient states that since recent hospitalization, he only had BMs with enemas. Message routed to Dr. [name]

## 2020-11-13 NOTE — Telephone Encounter (Signed)
Faxed [facility] Request for PA CMN/PA: to [employer] for incontinence supplies [phone] Fax confirmation received

## 2020-11-13 NOTE — Telephone Encounter (Signed)
Note in error.

## 2020-11-14 ENCOUNTER — Other Ambulatory Visit: Payer: Self-pay

## 2020-11-14 ENCOUNTER — Telehealth (HOSPITAL_COMMUNITY): Payer: Self-pay

## 2020-11-14 ENCOUNTER — Inpatient Hospital Stay: Payer: Medicare Other | Attending: Oncology | Admitting: Oncology

## 2020-11-14 ENCOUNTER — Inpatient Hospital Stay: Payer: Medicare Other

## 2020-11-14 ENCOUNTER — Other Ambulatory Visit (HOSPITAL_COMMUNITY): Payer: Self-pay

## 2020-11-14 VITALS — BP 123/61 | HR 85 | Temp 97.9°F | Resp 18 | Ht 68.0 in | Wt 182.3 lb

## 2020-11-14 DIAGNOSIS — Z192 Hormone resistant malignancy status: Secondary | ICD-10-CM | POA: Diagnosis not present

## 2020-11-14 DIAGNOSIS — C775 Secondary and unspecified malignant neoplasm of intrapelvic lymph nodes: Secondary | ICD-10-CM | POA: Insufficient documentation

## 2020-11-14 DIAGNOSIS — D649 Anemia, unspecified: Secondary | ICD-10-CM | POA: Insufficient documentation

## 2020-11-14 DIAGNOSIS — C7951 Secondary malignant neoplasm of bone: Secondary | ICD-10-CM | POA: Insufficient documentation

## 2020-11-14 DIAGNOSIS — K5900 Constipation, unspecified: Secondary | ICD-10-CM | POA: Diagnosis not present

## 2020-11-14 DIAGNOSIS — Z79899 Other long term (current) drug therapy: Secondary | ICD-10-CM | POA: Diagnosis not present

## 2020-11-14 DIAGNOSIS — Z5189 Encounter for other specified aftercare: Secondary | ICD-10-CM | POA: Insufficient documentation

## 2020-11-14 DIAGNOSIS — R059 Cough, unspecified: Secondary | ICD-10-CM

## 2020-11-14 DIAGNOSIS — Z923 Personal history of irradiation: Secondary | ICD-10-CM | POA: Insufficient documentation

## 2020-11-14 DIAGNOSIS — Z95828 Presence of other vascular implants and grafts: Secondary | ICD-10-CM

## 2020-11-14 DIAGNOSIS — C61 Malignant neoplasm of prostate: Secondary | ICD-10-CM | POA: Insufficient documentation

## 2020-11-14 DIAGNOSIS — Z5111 Encounter for antineoplastic chemotherapy: Secondary | ICD-10-CM | POA: Insufficient documentation

## 2020-11-14 DIAGNOSIS — R1310 Dysphagia, unspecified: Secondary | ICD-10-CM

## 2020-11-14 LAB — CBC WITH DIFFERENTIAL (CANCER CENTER ONLY)
Abs Immature Granulocytes: 0.38 10*3/uL — ABNORMAL HIGH (ref 0.00–0.07)
Basophils Absolute: 0.1 10*3/uL (ref 0.0–0.1)
Basophils Relative: 0 %
Eosinophils Absolute: 0.3 10*3/uL (ref 0.0–0.5)
Eosinophils Relative: 2 %
HCT: 26.8 % — ABNORMAL LOW (ref 39.0–52.0)
Hemoglobin: 8.3 g/dL — ABNORMAL LOW (ref 13.0–17.0)
Immature Granulocytes: 3 %
Lymphocytes Relative: 10 %
Lymphs Abs: 1.4 10*3/uL (ref 0.7–4.0)
MCH: 28.5 pg (ref 26.0–34.0)
MCHC: 31 g/dL (ref 30.0–36.0)
MCV: 92.1 fL (ref 80.0–100.0)
Monocytes Absolute: 0.8 10*3/uL (ref 0.1–1.0)
Monocytes Relative: 6 %
Neutro Abs: 11 10*3/uL — ABNORMAL HIGH (ref 1.7–7.7)
Neutrophils Relative %: 79 %
Platelet Count: 365 10*3/uL (ref 150–400)
RBC: 2.91 MIL/uL — ABNORMAL LOW (ref 4.22–5.81)
RDW: 15.5 % (ref 11.5–15.5)
WBC Count: 14 10*3/uL — ABNORMAL HIGH (ref 4.0–10.5)
nRBC: 0 % (ref 0.0–0.2)

## 2020-11-14 LAB — CMP (CANCER CENTER ONLY)
ALT: 45 U/L — ABNORMAL HIGH (ref 0–44)
AST: 35 U/L (ref 15–41)
Albumin: 2 g/dL — ABNORMAL LOW (ref 3.5–5.0)
Alkaline Phosphatase: 134 U/L — ABNORMAL HIGH (ref 38–126)
Anion gap: 9 (ref 5–15)
BUN: 22 mg/dL (ref 8–23)
CO2: 28 mmol/L (ref 22–32)
Calcium: 9.2 mg/dL (ref 8.9–10.3)
Chloride: 103 mmol/L (ref 98–111)
Creatinine: 1 mg/dL (ref 0.61–1.24)
GFR, Estimated: >60 mL/min (ref >60)
Glucose, Bld: 135 mg/dL — ABNORMAL HIGH (ref 70–99)
Potassium: 4 mmol/L (ref 3.5–5.1)
Sodium: 140 mmol/L (ref 135–145)
Total Bilirubin: 0.3 mg/dL (ref 0.3–1.2)
Total Protein: 7.8 g/dL (ref 6.5–8.1)

## 2020-11-14 MED ORDER — HEPARIN SOD (PORK) LOCK FLUSH 100 UNIT/ML IV SOLN
500.0000 [IU] | Freq: Once | INTRAVENOUS | Status: AC
  Administered 2020-11-14: 500 [IU] via INTRAVENOUS
  Filled 2020-11-14: qty 5

## 2020-11-14 MED ORDER — SODIUM CHLORIDE 0.9% FLUSH
10.0000 mL | Freq: Once | INTRAVENOUS | Status: AC
  Administered 2020-11-14: 10 mL via INTRAVENOUS
  Filled 2020-11-14: qty 10

## 2020-11-14 NOTE — Progress Notes (Signed)
Hematology and Oncology Follow Up Visit  ARAMIS WEIL [id number] 1950-04-07 70 y.o. 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  He presented with Gleason score 4+5 = 9 and a PSA of 366 at the time of diagnosis.  Prior Therapy: He is status post prostate biopsy completed on December 3 of 2020.  Pathology showed a Gleason score 4+5 = 9 with 12 out of 12 cores.  He is status post both of radiation therapy to the spine between T9 and L1.  He received 30 Gray in 10 fractions between January 15 and 28, 2021.   Firmagon 240 mg started on May 31, 2019.  Zytiga 1000 mg daily with prednisone 5 mg daily started in May 2021. Therapy discontinued in October of 2021 due to progression of disease.  Taxotere chemotherapy with 75 mg per metered squared every 3 weeks started in October 2021.  The dose was reduced to 60 mg per metered square after 3 cycles.   He completed 5 cycles of therapy.  He is status post radiation therapy to enlarging pelvic prostate cancer mass.  He received 30 Gray in 10 fractions completed April 2022.   Current therapy:   Eligard every 4 months.  He will receive Eligard today and repeated in 4 months.  Jevtana chemotherapy 20 mg per metered squared tentatively to start on November 15, 2020.   Interim History: Ms. Urwin returns today for a follow-up evaluation.  Since her last visit, he was hospitalized throughout the month of July initially for obstructive uropathy requiring nephrostomy tube placement and subsequently for altered mental status and myoclonus jerking.  Since his discharge, he reports feeling better and continue to improve slowly.  He is ambulating with the help of a walker without any falls or syncope.  His pain is improved at this time and uses Dilaudid as needed and morphine has been  discontinued because of suspected myoclonus.  He denies any fevers or chills or sweats.    Medications: Reviewed without changes. Current Outpatient Medications  Medication Sig Dispense Refill         Ferrous Sulfate (IRON PO) Take 1 tablet by mouth daily.     HYDROmorphone (DILAUDID) 4 MG tablet Take 1 tablet (4 mg total) by mouth every 4 (four) hours as needed for severe pain. 60 tablet 0   lidocaine-prilocaine (EMLA) cream Apply 1 application topically as needed. 30 g 0   morphine (MS CONTIN) 30 MG 12 hr tablet Take 1 tablet (30 mg total) by mouth every 8 (eight) hours. 90 tablet 0   Multiple Vitamin (MULTIVITAMIN WITH MINERALS) TABS tablet Take 1 tablet by mouth daily.     ondansetron (ZOFRAN) 4 MG tablet TAKE 1 TABLET (4 MG TOTAL) BY MOUTH 3 (THREE) TIMES DAILY. (Patient taking differently: Take 4 mg by mouth 3 (three) times daily as needed for nausea or vomiting. ) 20 tablet 0   oxybutynin (DITROPAN) 5 MG tablet Take 5 mg by mouth 3 (three) times daily as needed for bladder spasms.     Plecanatide (TRULANCE) 3 MG TABS Take 3 mg by mouth daily. 30 tablet 2   polyethylene glycol (MIRALAX) 17 g packet Take 17 g by mouth 2 (two) times daily. 60 each 0   predniSONE (DELTASONE) 5 MG tablet Take 1 tablet (5 mg total) by mouth daily with breakfast. 90 tablet 3   prochlorperazine (  COMPAZINE) 10 MG tablet Take 1 tablet (10 mg total) by mouth every 6 (six) hours as needed for nausea or vomiting. 30 tablet 0   senna (SENOKOT) 8.6 MG TABS tablet Take 2 tablets by mouth daily.      tamsulosin (FLOMAX) 0.4 MG CAPS capsule Take 1 capsule (0.4 mg total) by mouth daily after supper. (Patient taking differently: Take 0.4 mg by mouth daily as needed (to help with urination). ) 30 capsule 3   No current facility-administered medications for this visit.     Allergies:  Allergies  Allergen Reactions   Ibuprofen Anaphylaxis   Shrimp [Shellfish Allergy] Anaphylaxis       Physical Exam:      Blood  pressure 123/61, pulse 85, temperature 97.9 F (36.6 C), temperature source Tympanic, resp. rate 18, height 5\' 8"  (1.727 m), weight 182 lb 4.8 oz (82.7 kg), SpO2 100 %.        ECOG: 2    General appearance: Alert, awake without any distress. Head: Atraumatic without abnormalities Oropharynx: Without any thrush or ulcers. Eyes: No scleral icterus. Lymph nodes: No lymphadenopathy noted in the cervical, supraclavicular, or axillary nodes Heart:regular rate and rhythm, without any murmurs or gallops.   Lung: Clear to auscultation without any rhonchi, wheezes or dullness to percussion. Abdomin: Soft, nontender without any shifting dullness or ascites. Musculoskeletal: No clubbing or cyanosis. Neurological: No motor or sensory deficits. Skin: No rashes or lesions.                       Lab Results: Lab Results  Component Value Date   WBC 15.9 (H) 11/06/2020   HGB 9.2 (L) 11/06/2020   HCT 28.9 (L) 11/06/2020   MCV 89 11/06/2020   PLT 303 11/06/2020     Chemistry      Component Value Date/Time   NA 139 11/02/2020 1334   K 4.5 11/02/2020 1334   CL 104 11/02/2020 1334   CO2 24 11/02/2020 1334   BUN 47 (H) 11/02/2020 1334   CREATININE 1.11 11/02/2020 1334   CREATININE 1.44 (H) 09/17/2020 1050      Component Value Date/Time   CALCIUM 9.6 11/02/2020 1334   ALKPHOS 127 (H) 11/02/2020 1334   AST 31 11/02/2020 1334   AST 15 09/17/2020 1050   ALT 52 (H) 11/02/2020 1334   ALT 7 09/17/2020 1050   BILITOT 0.5 11/02/2020 1334   BILITOT 0.2 (L) 09/17/2020 1050        Results for Beechy, Daril R (MRN [id number]) as of 11/14/2020 11:35  Ref. Range 06/18/2020 12:51 07/18/2020 11:43 09/17/2020 10:50  Prostate Specific Ag, Serum Latest Ref Range: 0.0 - 4.0 ng/mL 74.4 (H) 85.6 (H) 91.5 (H)            Impression and Plan:   70 year old man with:   1.    Castration-resistant advanced prostate cancer with lymphadenopathy diagnosed initially in 2020.   The  natural course of his disease was discussed at this time and treatment choices were reiterated.  He is scheduled to start Jevtana chemotherapy as a salvage option with alternative therapy including lutetium 117 and possibly transitioning to supportive care and hospice is also considered.  Risks and benefits of all these approaches were discussed at this time.  After discussion today, he is willing to continue with chemotherapy at this time.  Transitioning to hospice could be a consideration if he has poor response or has any decline in performance status.   2.  Androgen deprivation therapy: Last injection was given in April and will be repeated on November 15, 2020.  Complication clinic weight gain, hot flashes among others were reiterated.   3.  Bone directed therapy: Delton See has been deferred for the time being due to lack of dental clearance.  He is on calcium vitamin D supplements.   4.  Bone pain: He is currently on Dilaudid only with morphine was discontinued due to myoclonus.   5.  IV access: Port-A-Cath remains in place and will be continues periodically.  6.  Hydronephrosis: Bilateral nephrostomy tube placed in June 2022.  His kidney function has normalized at this time.  7.  Anemia: Hemoglobin on August 3 was 9.2 and overall table.  Anemia is related to malignancy.  8.  Constipation: Continues to be an issue with him and uses an enema periodically.  He is on bowel regimen which I recommended continuing.  9.  Follow-up: On November 15, 2020 for chemotherapy infusion and in 3 weeks for the second cycle of therapy.     30  minutes were spent on this visit.  The time was dedicated to reviewing laboratory data, disease status update, treatment choices and future plan of care discussion.   Zola Button, MD 8/11/202211:30 AM

## 2020-11-14 NOTE — Telephone Encounter (Signed)
Attempted to contact patient to schedule OP MBS - left voicemail. ?

## 2020-11-15 ENCOUNTER — Inpatient Hospital Stay: Payer: Medicare Other

## 2020-11-15 VITALS — BP 116/72 | HR 76 | Temp 98.1°F | Resp 18

## 2020-11-15 DIAGNOSIS — C61 Malignant neoplasm of prostate: Secondary | ICD-10-CM

## 2020-11-15 DIAGNOSIS — Z5111 Encounter for antineoplastic chemotherapy: Secondary | ICD-10-CM | POA: Diagnosis not present

## 2020-11-15 DIAGNOSIS — Z192 Hormone resistant malignancy status: Secondary | ICD-10-CM

## 2020-11-15 MED ORDER — FAMOTIDINE 20 MG IN NS 100 ML IVPB
20.0000 mg | Freq: Two times a day (BID) | INTRAVENOUS | Status: DC
  Administered 2020-11-15: 20 mg via INTRAVENOUS
  Filled 2020-11-15: qty 20

## 2020-11-15 MED ORDER — SODIUM CHLORIDE 0.9 % IV SOLN
10.0000 mg | Freq: Once | INTRAVENOUS | Status: AC
  Administered 2020-11-15: 10 mg via INTRAVENOUS
  Filled 2020-11-15: qty 10

## 2020-11-15 MED ORDER — HEPARIN SOD (PORK) LOCK FLUSH 100 UNIT/ML IV SOLN
500.0000 [IU] | Freq: Once | INTRAVENOUS | Status: AC | PRN
  Administered 2020-11-15: 500 [IU]
  Filled 2020-11-15: qty 5

## 2020-11-15 MED ORDER — SODIUM CHLORIDE 0.9% FLUSH
10.0000 mL | INTRAVENOUS | Status: DC | PRN
  Administered 2020-11-15: 10 mL
  Filled 2020-11-15: qty 10

## 2020-11-15 MED ORDER — DIPHENHYDRAMINE HCL 50 MG/ML IJ SOLN
25.0000 mg | Freq: Once | INTRAMUSCULAR | Status: AC
  Administered 2020-11-15: 25 mg via INTRAVENOUS
  Filled 2020-11-15: qty 1

## 2020-11-15 MED ORDER — FAMOTIDINE IN NACL 20-0.9 MG/50ML-% IV SOLN: 20.0000 mg | Freq: Once | INTRAVENOUS | Status: DC

## 2020-11-15 MED ORDER — CABAZITAXEL CHEMO INJECTION 60 MG/6ML W/DILUENT
20.0000 mg/m2 | Freq: Once | INTRAVENOUS | Status: AC
  Administered 2020-11-15: 43 mg via INTRAVENOUS
  Filled 2020-11-15: qty 4.3

## 2020-11-15 MED ORDER — SODIUM CHLORIDE 0.9 % IV SOLN
Freq: Once | INTRAVENOUS | Status: AC
  Filled 2020-11-15: qty 250

## 2020-11-15 MED ORDER — LEUPROLIDE ACETATE (4 MONTH) 30 MG ~~LOC~~ KIT
30.0000 mg | PACK | Freq: Once | SUBCUTANEOUS | Status: AC
  Administered 2020-11-15: 30 mg via SUBCUTANEOUS
  Filled 2020-11-15: qty 30

## 2020-11-15 NOTE — Patient Instructions (Signed)
Implanted Port Home Guide An implanted port is a device that is placed under the skin. It is usually placed in the chest. The device can be used to give IV medicine, to take blood, or for dialysis. You may have an implanted port if: You need IV medicine that would be irritating to the small veins in your hands or arms. You need IV medicines, such as antibiotics, for a long period of time. You need IV nutrition for a long period of time. You need dialysis. When you have a port, your health care provider can choose to use the port instead of veins in your arms for these procedures. You may have fewer limitations when using a port than you would if you used other types of long-term IVs, and you will likely be able to return to normal activities afteryour incision heals. An implanted port has two main parts: Reservoir. The reservoir is the part where a needle is inserted to give medicines or draw blood. The reservoir is round. After it is placed, it appears as a small, raised area under your skin. Catheter. The catheter is a thin, flexible tube that connects the reservoir to a vein. Medicine that is inserted into the reservoir goes into the catheter and then into the vein. How is my port accessed? To access your port: A numbing cream may be placed on the skin over the port site. Your health care provider will put on a mask and sterile gloves. The skin over your port will be cleaned carefully with a germ-killing soap and allowed to dry. Your health care provider will gently pinch the port and insert a needle into it. Your health care provider will check for a blood return to make sure the port is in the vein and is not clogged. If your port needs to remain accessed to get medicine continuously (constant infusion), your health care provider will place a clear bandage (dressing) over the needle site. The dressing and needle will need to be changed every week, or as told by your health care provider. What  is flushing? Flushing helps keep the port from getting clogged. Follow instructions from your health care provider about how and when to flush the port. Ports are usually flushed with saline solution or a medicine called heparin. The need for flushing will depend on how the port is used: If the port is only used from time to time to give medicines or draw blood, the port may need to be flushed: Before and after medicines have been given. Before and after blood has been drawn. As part of routine maintenance. Flushing may be recommended every 4-6 weeks. If a constant infusion is running, the port may not need to be flushed. Throw away any syringes in a disposal container that is meant for sharp items (sharps container). You can buy a sharps container from a pharmacy, or you can make one by using an empty hard plastic bottle with a cover. How long will my port stay implanted? The port can stay in for as long as your health care provider thinks it is needed. When it is time for the port to come out, a surgery will be done to remove it. The surgery will be similar to the procedure that was done to putthe port in. Follow these instructions at home:  Flush your port as told by your health care provider. If you need an infusion over several days, follow instructions from your health care provider about how to take   care of your port site. Make sure you: Wash your hands with soap and water before you change your dressing. If soap and water are not available, use alcohol-based hand sanitizer. Change your dressing as told by your health care provider. Place any used dressings or infusion bags into a plastic bag. Throw that bag in the trash. Keep the dressing that covers the needle clean and dry. Do not get it wet. Do not use scissors or sharp objects near the tube. Keep the tube clamped, unless it is being used. Check your port site every day for signs of infection. Check for: Redness, swelling, or  pain. Fluid or blood. Pus or a bad smell. Protect the skin around the port site. Avoid wearing bra straps that rub or irritate the site. Protect the skin around your port from seat belts. Place a soft pad over your chest if needed. Bathe or shower as told by your health care provider. The site may get wet as long as you are not actively receiving an infusion. Return to your normal activities as told by your health care provider. Ask your health care provider what activities are safe for you. Carry a medical alert card or wear a medical alert bracelet at all times. This will let health care providers know that you have an implanted port in case of an emergency. Get help right away if: You have redness, swelling, or pain at the port site. You have fluid or blood coming from your port site. You have pus or a bad smell coming from the port site. You have a fever. Summary Implanted ports are usually placed in the chest for long-term IV access. Follow instructions from your health care provider about flushing the port and changing bandages (dressings). Take care of the area around your port by avoiding clothing that puts pressure on the area, and by watching for signs of infection. Protect the skin around your port from seat belts. Place a soft pad over your chest if needed. Get help right away if you have a fever or you have redness, swelling, pain, drainage, or a bad smell at the port site. This information is not intended to replace advice given to you by your health care provider. Make sure you discuss any questions you have with your healthcare provider. Document Revised: 08/07/2019 Document Reviewed: 08/07/2019 Elsevier Patient Education  2022 [employer].  

## 2020-11-15 NOTE — Patient Instructions (Signed)
[facility]  Discharge Instructions: Thank you for choosing [facility] to provide your oncology and hematology care.   If you have a lab appointment with the [facility], please go directly to the [facility] and check in at the registration area.  Wear comfortable clothing and clothing appropriate for easy access to any Portacath or PICC line.   We strive to give you quality time with your provider. You may need to reschedule your appointment if you arrive late (15 or more minutes).  Arriving late affects you and other patients whose appointments are after yours.  Also, if you miss three or more appointments without notifying the office, you may be dismissed from the clinic at the provider's discretion.      For prescription refill requests, have your pharmacy contact our office and allow 72 hours for refills to be completed.    Today you received the following chemotherapy and/or immunotherapy agents jevtana   To help prevent nausea and vomiting after your treatment, we encourage you to take your nausea medication as directed.  BELOW ARE SYMPTOMS THAT SHOULD BE REPORTED IMMEDIATELY: *FEVER GREATER THAN 100.4 F (38 C) OR HIGHER *CHILLS OR SWEATING *NAUSEA AND VOMITING THAT IS NOT CONTROLLED WITH YOUR NAUSEA MEDICATION *UNUSUAL SHORTNESS OF BREATH *UNUSUAL BRUISING OR BLEEDING *URINARY PROBLEMS (pain or burning when urinating, or frequent urination) *BOWEL PROBLEMS (unusual diarrhea, constipation, pain near the anus) TENDERNESS IN MOUTH AND THROAT WITH OR WITHOUT PRESENCE OF ULCERS (sore throat, sores in mouth, or a toothache) UNUSUAL RASH, SWELLING OR PAIN  UNUSUAL VAGINAL DISCHARGE OR ITCHING   Items with * indicate a potential emergency and should be followed up as soon as possible or go to the Emergency Department if any problems should occur.  Please show the CHEMOTHERAPY ALERT CARD or IMMUNOTHERAPY ALERT CARD at check-in to the  Emergency Department and triage nurse. Should you have questions after your visit or need to cancel or reschedule your appointment, please contact [facility]  [phone] and follow the prompts.  Office hours are 8:00 a.m. to 4:30 p.m. Monday - Friday. Please note that voicemails left after 4:00 p.m. may not be returned until the following business day.  We are closed weekends and major holidays. You have access to a nurse at all times for urgent questions. Please call the main number to the clinic [phone] and follow the prompts.  For any non-urgent questions, you may also contact your provider using MyChart. We now offer e-Visits for anyone 69 and older to request care online for non-urgent symptoms. For details visit [email or web address].   Also download the MyChart app! Go to the app store, search "MyChart", open the app, select [facility], and log in with your MyChart username and password.  Due to Covid, a mask is required upon entering the hospital/clinic. If you do not have a mask, one will be given to you upon arrival. For doctor visits, patients may have 1 support person aged 68 or older with them. For treatment visits, patients cannot have anyone with them due to current Covid guidelines and our immunocompromised population.

## 2020-11-16 LAB — PROSTATE-SPECIFIC AG, SERUM (LABCORP): Prostate Specific Ag, Serum: 576 ng/mL — ABNORMAL HIGH (ref 0.0–4.0)

## 2020-11-18 ENCOUNTER — Ambulatory Visit (HOSPITAL_COMMUNITY)
Admission: RE | Admit: 2020-11-18 | Discharge: 2020-11-18 | Disposition: A | Discharge status: Home / Self Care | Payer: Medicare Other | Source: Ambulatory Visit | Attending: Family Medicine | Admitting: Family Medicine

## 2020-11-18 ENCOUNTER — Inpatient Hospital Stay: Payer: Medicare Other

## 2020-11-18 ENCOUNTER — Other Ambulatory Visit: Payer: Self-pay | Admitting: Internal Medicine

## 2020-11-18 ENCOUNTER — Other Ambulatory Visit: Payer: Self-pay

## 2020-11-18 VITALS — BP 126/67 | HR 94 | Temp 98.5°F | Resp 18

## 2020-11-18 DIAGNOSIS — R059 Cough, unspecified: Secondary | ICD-10-CM | POA: Insufficient documentation

## 2020-11-18 DIAGNOSIS — Z87442 Personal history of urinary calculi: Secondary | ICD-10-CM | POA: Insufficient documentation

## 2020-11-18 DIAGNOSIS — C61 Malignant neoplasm of prostate: Secondary | ICD-10-CM

## 2020-11-18 DIAGNOSIS — N1330 Unspecified hydronephrosis: Secondary | ICD-10-CM | POA: Insufficient documentation

## 2020-11-18 DIAGNOSIS — R1310 Dysphagia, unspecified: Secondary | ICD-10-CM

## 2020-11-18 DIAGNOSIS — Z8546 Personal history of malignant neoplasm of prostate: Secondary | ICD-10-CM | POA: Insufficient documentation

## 2020-11-18 DIAGNOSIS — Z85830 Personal history of malignant neoplasm of bone: Secondary | ICD-10-CM | POA: Insufficient documentation

## 2020-11-18 DIAGNOSIS — Z5111 Encounter for antineoplastic chemotherapy: Secondary | ICD-10-CM | POA: Diagnosis not present

## 2020-11-18 DIAGNOSIS — Z91013 Allergy to seafood: Secondary | ICD-10-CM | POA: Insufficient documentation

## 2020-11-18 DIAGNOSIS — Z79899 Other long term (current) drug therapy: Secondary | ICD-10-CM | POA: Insufficient documentation

## 2020-11-18 MED ORDER — PEGFILGRASTIM-JMDB 6 MG/0.6ML ~~LOC~~ SOSY
6.0000 mg | PREFILLED_SYRINGE | Freq: Once | SUBCUTANEOUS | Status: AC
  Administered 2020-11-18: 6 mg via SUBCUTANEOUS
  Filled 2020-11-18: qty 0.6

## 2020-11-18 NOTE — Patient Instructions (Signed)

## 2020-11-18 NOTE — Progress Notes (Signed)
Modified Barium Swallow Progress Note  Patient Details  Name: Andre Glass MRN: [id number] Date of Birth: 02-06-1951  Today's Date: 11/18/2020  Modified Barium Swallow completed.  Full report located under Chart Review in the Imaging Section.  Brief recommendations include the following:  Clinical Impression  Pharyngeal swallow was within normal limits. Pt states he is unable to masticate effectively and propel food to initiate a swallow. He was able to manipulate puree, masticate soft ceral bar texture and barium coated graham cracker with additional time and obvious effort. Under fluoroscopy he achieved lateral mastication pattern and intermittent lingual propulsion/tongu to hard palate attempts to propel textures. Min-mild residue anterior tongue masticated with graham cracker to a puree texture. WIth sips of thin barium was able to clear oral cavity. Etiology of oral deficits unclear. His lingual ROM and strength appear appropriate during oral-motor assessment. He is receiving chemo although not to oral mucousa. Possible xerostomia leading to poor lubrication of bolus although lingual surface did not appear dry on observation. Behavioral versus neuro involvement (?). MRI last month was negative. Recommended pt possible follow up with neurology if does not improve. Continue with [facility] Speech Pathologist. Recommend textures with small amount of mastication and moving up to softer solids (graham cracker softened in applesauce, cut up banana, soft pasta, meatloaf). Thin liquids, pills with thin (surprisingly he transited pill with thin barium without difficulty). Pt and friend eduated verbally and written instructions to the above.   Swallow Evaluation Recommendations       SLP Diet Recommendations: Thin liquid;Dysphagia 2 (Fine chop) solids (work up to regular texture with SLP)   Liquid Administration via: Cup;Straw   Medication Administration: Whole meds with liquid               Oral Care  Recommendations: Oral care BID        Houston Siren 11/18/2020,5:30 PM Orbie Pyo Colvin Caroli.Ed Risk analyst [phone] Office [phone]

## 2020-11-19 ENCOUNTER — Encounter (HOSPITAL_COMMUNITY): Payer: Self-pay

## 2020-11-19 ENCOUNTER — Emergency Department (HOSPITAL_COMMUNITY)
Admission: EM | Admit: 2020-11-19 | Discharge: 2020-11-20 | Disposition: A | Discharge status: Home / Self Care | Payer: Medicare Other | Attending: Emergency Medicine | Admitting: Emergency Medicine

## 2020-11-19 ENCOUNTER — Encounter: Payer: Self-pay | Admitting: Oncology

## 2020-11-19 ENCOUNTER — Ambulatory Visit (HOSPITAL_COMMUNITY)
Admission: RE | Admit: 2020-11-19 | Discharge: 2020-11-19 | Disposition: A | Discharge status: Home / Self Care | Payer: Medicare Other | Source: Ambulatory Visit | Attending: Urology | Admitting: Urology

## 2020-11-19 ENCOUNTER — Other Ambulatory Visit: Payer: Self-pay

## 2020-11-19 ENCOUNTER — Other Ambulatory Visit (HOSPITAL_COMMUNITY): Payer: Self-pay | Admitting: Urology

## 2020-11-19 DIAGNOSIS — N139 Obstructive and reflux uropathy, unspecified: Secondary | ICD-10-CM

## 2020-11-19 DIAGNOSIS — N289 Disorder of kidney and ureter, unspecified: Secondary | ICD-10-CM | POA: Insufficient documentation

## 2020-11-19 DIAGNOSIS — I82403 Acute embolism and thrombosis of unspecified deep veins of lower extremity, bilateral: Secondary | ICD-10-CM

## 2020-11-19 DIAGNOSIS — T83098A Other mechanical complication of other indwelling urethral catheter, initial encounter: Secondary | ICD-10-CM | POA: Diagnosis not present

## 2020-11-19 DIAGNOSIS — Z8546 Personal history of malignant neoplasm of prostate: Secondary | ICD-10-CM | POA: Insufficient documentation

## 2020-11-19 DIAGNOSIS — R109 Unspecified abdominal pain: Secondary | ICD-10-CM | POA: Insufficient documentation

## 2020-11-19 DIAGNOSIS — N1330 Unspecified hydronephrosis: Secondary | ICD-10-CM

## 2020-11-19 DIAGNOSIS — Y846 Urinary catheterization as the cause of abnormal reaction of the patient, or of later complication, without mention of misadventure at the time of the procedure: Secondary | ICD-10-CM | POA: Diagnosis not present

## 2020-11-19 DIAGNOSIS — R1310 Dysphagia, unspecified: Secondary | ICD-10-CM | POA: Diagnosis not present

## 2020-11-19 HISTORY — PX: IR URETERAL STENT PERC EXCHG MOD SED: IMG2337

## 2020-11-19 MED ORDER — SODIUM CHLORIDE 0.9 % IV SOLN
INTRAVENOUS | Status: AC
  Filled 2020-11-19: qty 20

## 2020-11-19 MED ORDER — SODIUM CHLORIDE 0.9 % IV SOLN: INTRAVENOUS | Status: DC

## 2020-11-19 MED ORDER — FENTANYL CITRATE (PF) 100 MCG/2ML IJ SOLN
INTRAMUSCULAR | Status: AC
  Filled 2020-11-19: qty 2

## 2020-11-19 MED ORDER — ACETAMINOPHEN 325 MG PO TABS
ORAL_TABLET | ORAL | Status: AC
  Administered 2020-11-19: 650 mg via ORAL
  Filled 2020-11-19: qty 2

## 2020-11-19 MED ORDER — ACETAMINOPHEN 325 MG PO TABS
650.0000 mg | ORAL_TABLET | Freq: Once | ORAL | Status: AC
  Filled 2020-11-19: qty 2

## 2020-11-19 MED ORDER — SODIUM CHLORIDE 0.9 % IV SOLN
2.0000 g | Freq: Once | INTRAVENOUS | Status: AC
  Administered 2020-11-19: 2 g via INTRAVENOUS

## 2020-11-19 MED ORDER — LIDOCAINE HCL 1 % IJ SOLN
INTRAMUSCULAR | Status: AC
  Filled 2020-11-19: qty 20

## 2020-11-19 MED ORDER — MIDAZOLAM HCL 2 MG/2ML IJ SOLN
INTRAMUSCULAR | Status: AC
  Filled 2020-11-19: qty 2

## 2020-11-19 MED ORDER — IOHEXOL 300 MG/ML  SOLN
50.0000 mL | Freq: Once | INTRAMUSCULAR | Status: AC | PRN
  Administered 2020-11-19: 20 mL

## 2020-11-19 MED ORDER — MIDAZOLAM HCL 2 MG/2ML IJ SOLN
INTRAMUSCULAR | Status: AC | PRN
  Administered 2020-11-19: 1 mg via INTRAVENOUS

## 2020-11-19 MED ORDER — FENTANYL CITRATE (PF) 100 MCG/2ML IJ SOLN
INTRAMUSCULAR | Status: AC | PRN
  Administered 2020-11-19 (×2): 25 ug via INTRAVENOUS

## 2020-11-19 NOTE — H&P (Signed)
Chief Complaint: Patient was seen in consultation today for antegrade bilateral ureteral stent placement   Referring Physician(s): Bell,Eugene D III  Supervising Physician: Mir, Sharen Heck  Patient Status: [facility] - Out-pt  History of Present Illness: Andre Glass is a 70 y.o. male   Hx of prostate cancer, TURP, lymphadenopathy, mets to bone UTI, anemia, bilateral PCN tube placement 09/29/20; replaced 11/02/20  He is here today for image guided antegrade bilateral ureteral stent placement for metastatic prostate cancer w/ bilateral obstructive hydronephrosis  Pt states that he is feeling well Urine output good, clear and yellow No complaints  Past Medical History:  Diagnosis Date   Anemia    History of kidney stones    Prostate cancer ([place])    UTI (urinary tract infection)     Past Surgical History:  Procedure Laterality Date   IR IMAGING GUIDED PORT INSERTION  01/31/2020   IR NEPHROSTOMY EXCHANGE RIGHT  10/25/2020   IR NEPHROSTOMY PLACEMENT LEFT  09/29/2020   IR NEPHROSTOMY PLACEMENT LEFT  11/02/2020   IR NEPHROSTOMY PLACEMENT RIGHT  09/29/2020   IR NEPHROSTOMY PLACEMENT RIGHT  11/02/2020   TRANSURETHRAL RESECTION OF PROSTATE N/A 11/08/2019   Procedure: TRANSURETHRAL RESECTION OF THE PROSTATE (TURP);  Surgeon: [name], MD;  Location: [facility];  Service: Urology;  Laterality: N/A;    Allergies: Ibuprofen and Shrimp [shellfish allergy]  Medications: Prior to Admission medications   Medication Sig Start Date End Date Taking? Authorizing Provider  acetaminophen (TYLENOL) 325 MG tablet Take 2 tablets (650 mg total) by mouth every 6 (six) hours as needed for headache. 10/30/20  Yes Ezequiel Essex, MD  amLODipine (NORVASC) 10 MG tablet Take 1 tablet (10 mg total) by mouth daily. 10/05/20  Yes Rai, Ripudeep K, MD  HYDROmorphone (DILAUDID) 4 MG tablet Take 1.5 tablets (6 mg total) by mouth every 4 (four) hours. 10/30/20 11/29/20 Yes Ezequiel Essex, MD  mirtazapine (REMERON)  7.5 MG tablet Take 1 tablet (7.5 mg total) by mouth at bedtime. 10/30/20  Yes Ezequiel Essex, MD  Multiple Vitamin (MULTIVITAMIN WITH MINERALS) TABS tablet Take 1 tablet by mouth daily.   Yes [provider]  naloxegol oxalate (MOVANTIK) 12.5 MG TABS tablet Take 1 tablet (12.5 mg total) by mouth daily. Take it in the morning 1 hour before breakfast daily for constipation. 10/05/20  Yes Rai, Ripudeep K, MD  ondansetron (ZOFRAN) 4 MG tablet Take 1 tablet (4 mg total) by mouth 3 (three) times daily as needed for nausea or vomiting. 09/24/20  Yes Shadad, Mathis Dad, MD  Plecanatide (TRULANCE) 3 MG TABS Take 3 mg by mouth daily. 08/08/20  Yes [name], [name], DO  polyethylene glycol (MIRALAX) 17 g packet Take 17 g by mouth 2 (two) times daily. 11/06/20  Yes Alen Bleacher, MD  predniSONE (DELTASONE) 5 MG tablet TAKE 1 TABLET BY MOUTH EVERY DAY WITH BREAKFAST 10/18/20  Yes [name], MD  prochlorperazine (COMPAZINE) 10 MG tablet Take 1 tablet (10 mg total) by mouth every 6 (six) hours as needed for nausea or vomiting. 09/24/20  Yes [name], MD  tamsulosin (FLOMAX) 0.4 MG CAPS capsule Take 1 capsule (0.4 mg total) by mouth daily as needed (to help with urination). 10/24/20  Yes [name], [name], DO  vitamin C (ASCORBIC ACID) 500 MG tablet Take 1,000 mg by mouth daily.   Yes [provider]  Vitamin E 268 MG (400 UNIT) CHEW Chew 1 tablet by mouth daily.   Yes [provider]  cefadroxil (  DURICEF) 500 MG capsule Take 1 capsule (500 mg total) by mouth 2 (two) times daily. 10/30/20   Ezequiel Essex, MD  feeding supplement (ENSURE ENLIVE / ENSURE PLUS) LIQD Take 237 mLs by mouth 3 (three) times daily between meals. 10/30/20   Ezequiel Essex, MD  lidocaine-prilocaine (EMLA) cream Apply 1 application topically as needed. 01/15/20   [name], MD  linaclotide (LINZESS) 290 MCG CAPS capsule Take 1 capsule (290 mcg total) by mouth daily as needed (constipation, take before breakfast). 10/05/20    Rai, Vernelle Emerald, MD  pregabalin (LYRICA) 50 MG capsule Take 1 capsule (50 mg total) by mouth 2 (two) times daily. 10/30/20 12/29/20  Ezequiel Essex, MD     Family History  Problem Relation Age of Onset   Stomach cancer Mother    Pancreatic cancer Sister    Breast cancer Neg Hx    Prostate cancer Neg Hx    Colon cancer Neg Hx     Social History   Socioeconomic History   Marital status: Single    Spouse name: Not on file   Number of children: 0   Years of education: Not on file   Highest education level: Not on file  Occupational History   Not on file  Tobacco Use   Smoking status: Never   Smokeless tobacco: Never  Vaping Use   Vaping Use: Never used  Substance and Sexual Activity   Alcohol use: Never   Drug use: Never   Sexual activity: Not on file  Other Topics Concern   Not on file  Social History Narrative   Not on file   Social Determinants of Health   Financial Resource Strain: Not on file  Food Insecurity: Not on file  Transportation Needs: Not on file  Physical Activity: Not on file  Stress: Not on file  Social Connections: Not on file     Review of Systems  Constitutional:  Negative for appetite change, chills and fever.  Respiratory:  Negative for shortness of breath.   Cardiovascular:  Negative for chest pain.  Gastrointestinal:  Negative for abdominal pain, nausea and vomiting.   Vital Signs: BP 113/65   Pulse 83   Temp 98.3 F (36.8 C) (Oral)   Ht 5\' 8"  (1.727 m)   Wt 184 lb (83.5 kg)   SpO2 100%   BMI 27.98 kg/m   Physical Exam Constitutional:      Appearance: Normal appearance.  HENT:     Mouth/Throat:     Mouth: Mucous membranes are moist.     Pharynx: Oropharynx is clear.  Cardiovascular:     Rate and Rhythm: Normal rate and regular rhythm.     Heart sounds: Normal heart sounds.  Pulmonary:     Effort: Pulmonary effort is normal.     Breath sounds: No wheezing, rhonchi or rales.  Abdominal:     General: There is no  distension.     Palpations: Abdomen is soft.     Tenderness: There is no abdominal tenderness. There is no guarding.  Musculoskeletal:     Right lower leg: No edema.     Left lower leg: No edema.  Skin:    General: Skin is warm and dry.  Neurological:     Mental Status: He is alert and oriented to person, place, and time.  Psychiatric:        Mood and Affect: Mood normal.        Behavior: Behavior normal.  Thought Content: Thought content normal.        Judgment: Judgment normal.    Imaging: DG Chest 2 View  Result Date: 10/27/2020 CLINICAL DATA:  Fever and acute mental status change. History of prostate cancer. EXAM: CHEST - 2 VIEW COMPARISON:  October 17, 2020 FINDINGS: Stable right Port-A-Cath in good position. No pneumothorax. No nodules or masses. No focal infiltrates. The cardiomediastinal silhouette is normal. IMPRESSION: No active cardiopulmonary disease. Electronically Signed   By: Dorise Bullion III M.D   On: 10/27/2020 14:00   DG Abd 1 View  Result Date: 11/02/2020 CLINICAL DATA:  Assess position of nephrostomy drains EXAM: ABDOMEN - 1 VIEW COMPARISON:  CT abdomen pelvis 10/25/2020 FINDINGS: No dilated loop of bowel to indicate ileus or obstruction. Diffuse osseous metastatic disease again seen. Right nephrostomy tube has been retracted. The left nephrostomy tube is absent. IMPRESSION: Retracted right nephrostomy tube. No left nephrostomy tube identified. Electronically Signed   By: Miachel Roux M.D.   On: 11/02/2020 14:27   CT ABDOMEN PELVIS W CONTRAST  Result Date: 10/25/2020 CLINICAL DATA:  Left lower quadrant abdominal pain. EXAM: CT ABDOMEN AND PELVIS WITH CONTRAST TECHNIQUE: Multidetector CT imaging of the abdomen and pelvis was performed using the standard protocol following bolus administration of intravenous contrast. CONTRAST:  191mL OMNIPAQUE IOHEXOL 300 MG/ML  SOLN COMPARISON:  CT 09/28/2020, PET CT 09/18/2020 reviewed FINDINGS: Lower chest: No acute airspace  disease or pleural effusion. Hypoventilatory changes in the dependent lower lobes. Hepatobiliary: Leretha Dykes of the liver is not entirely included in the field of view. No focal hepatic abnormality. Gallbladder physiologically distended, no calcified stone. No biliary dilatation. Pancreas: No ductal dilatation or inflammation. Spleen: Normal in size without focal abnormality. Splenule anterior inferiorly. Adrenals/Urinary Tract: No adrenal nodule. Right nephrostomy tube coiled posteriorly in the lower kidney, this is been subsequently evaluated with nephrostogram. Mild heterogeneous right renal enhancement. Slight dilatation of the middle and upper pole calyx. Mild enhancement of the right renal pelvis. Left nephrostomy tube with pigtail coiled in the lower pole calyx. Slight prominence of the mid and upper calices, but no renal pelvis dilatation. Mild heterogeneous left renal enhancement. Partially distended urinary bladder. Diffuse bladder wall thickening with perivesicular fat stranding. Lobulated masslike density at the bladder base, which currently appear contiguous with the prostate. Stomach/Bowel: Physiologically distended stomach. Normal positioning of the duodenum and ligament of Treitz. There is no small bowel obstruction, enteric contrast reaches the colon. No small bowel inflammation. Normal appendix. Enteric contrast reaches the descending colon. Stool in contrast distends the ascending and transverse colon. Moderate formed stool in the descending and sigmoid colon. Sigmoid colon is redundant. Vascular/Lymphatic: Aorta bi-iliac atherosclerosis. Portal vein is patent. There is no portal venous or mesenteric gas. There is retroperitoneal adenopathy, hypermetabolic on prior PET. Representative left periaortic node measures 16 mm at the level of the mid left kidney, series 3, image 34, not significantly changed from recent CT. Anterior lower parrot aortic node measures 18 mm, slightly increased in size previously  measuring 14 mm by my retrospective measurement. Many of the additional retroperitoneal nodes are slightly increased in size. Multifocal pelvic adenopathy. Dominant node adjacent to the right urinary bladder measures 2.7 cm, series 3, image 70, previously 1.6 cm. There is a necrotic left external iliac node measuring 2 cm, previously 1.5 cm. Multiple additional enlarged lymph nodes are seen throughout the pelvis with slight progression. Of these nodes demonstrate localized mass effect on the adjacent venous structures, however no discrete intraluminal filling defects  are seen. Reproductive: Enlarged nodular and multilobulated prostate gland extends into the bladder base, patient with known prostate malignancy. Other: Presacral soft tissue edema. No free air or ascites. Small subcutaneous density in the left anterior abdominal wall soft tissues may be related to medication injection sites or a soft tissue nodule. Musculoskeletal: Multifocal sclerotic metastasis with pelvic and vertebral involvement, not significantly changed from prior. No pathologic fracture. IMPRESSION: 1. Metastatic locally invasive prostate cancer with progressive adenopathy in the pelvis and retroperitoneum. 2. Bilateral nephrostomy tubes in place. Slight prominence of the renal collecting systems without frank hydronephrosis. Heterogeneous renal enhancement with slight enhancement of the right renal pelvis, recommend correlation with nephrostomy output to assess for urinary tract infection. 3. Diffuse bladder wall thickening with perivesicular fat stranding. 4. Multifocal sclerotic osseous metastasis. 5. Small subcutaneous density in the left anterior abdominal wall soft tissues may be related to medication injection site or a soft tissue nodule. Aortic Atherosclerosis (ICD10-I70.0). Electronically Signed   By: Keith Rake M.D.   On: 10/25/2020 16:52   DG SWALLOW FUNC OP MEDICARE SPEECH PATH  Result Date: 11/18/2020 Table formatting  from the original result was not included. Objective Swallowing Evaluation: Type of Study: MBS-Modified Barium Swallow Study  Patient Details Name: BISHOY CUPP MRN: [id number] Date of Birth: 1950-11-25 Today's Date: 11/18/2020 Time: SLP Start Time (ACUTE ONLY): 1114 -SLP Stop Time (ACUTE ONLY): 1245 SLP Time Calculation (min) (ACUTE ONLY): 29 min Past Medical History: Past Medical History: Diagnosis Date  Anemia   History of kidney stones   Prostate cancer ([place])   UTI (urinary tract infection)  Past Surgical History: Past Surgical History: Procedure Laterality Date  IR IMAGING GUIDED PORT INSERTION  01/31/2020  IR NEPHROSTOMY EXCHANGE RIGHT  10/25/2020  IR NEPHROSTOMY PLACEMENT LEFT  09/29/2020  IR NEPHROSTOMY PLACEMENT LEFT  11/02/2020  IR NEPHROSTOMY PLACEMENT RIGHT  09/29/2020  IR NEPHROSTOMY PLACEMENT RIGHT  11/02/2020  TRANSURETHRAL RESECTION OF PROSTATE N/A 11/08/2019  Procedure: TRANSURETHRAL RESECTION OF THE PROSTATE (TURP);  Surgeon: [name], MD;  Location: [facility];  Service: Urology;  Laterality: N/A; HPI: 70 yr old seen for outpatient MBS stating he has difficulty masticating his food and tries to swallow but is unable. Also reports low vocal intensity. Seen 10/2020 with recommendation for regular texture, thin liquids. MRI at that time was negative. Oral-motor ROM and strength appeared functional. PMH: metastatic protstate cancer, recurrentl UTI, HTN, obstructive uropathy with bil hydronephrosis and placement of bil nephrostomy tubes.  No data recorded Assessment / Plan / Recommendation CHL IP CLINICAL IMPRESSIONS 11/18/2020 Clinical Impression Pharyngeal swallow was within normal limits. Pt states he is unable to masticate effectively and propel food to initiate a swallow. He was able to manipulate puree, masticate soft ceral bar texture and barium coated graham cracker with additional time and obvious effort. Under fluoroscopy he achieved lateral mastication pattern and intermittent lingual  propulsion/tongu to hard palate attempts to propel textures. Min-mild residue anterior tongue masticated with graham cracker to a puree texture. WIth sips of thin barium was able to clear oral cavity. Etiology of oral deficits unclear. His lingual ROM and strength appear appropriate during oral-motor assessment. He is receiving chemo although not to oral mucousa. Possible xerostomia leading to poor lubrication of bolus although lingual surface did not appear dry on observation. Behavioral versus neuro involvement (?). MRI last month was negative. Recommended pt possible follow up with neurology if does not improve. Continue with [facility] Speech Pathologist. Recommend textures with small amount of mastication  and moving up to softer solids (graham cracker softened in applesauce, cut up banana, soft pasta, meatloaf). Thin liquids, pills with thin (surprisingly he transited pill with thin barium without difficulty). Pt and friend eduated verbally and written instructions to the above. SLP Visit Diagnosis Dysphagia, oral phase (R13.11) Attention and concentration deficit following -- Frontal lobe and executive function deficit following -- Impact on safety and function Mild aspiration risk   CHL IP TREATMENT RECOMMENDATION 11/18/2020 Treatment Recommendations Defer treatment plan to f/u with SLP   Prognosis 10/18/2020 Prognosis for Safe Diet Advancement Good Barriers to Reach Goals -- Barriers/Prognosis Comment -- CHL IP DIET RECOMMENDATION 11/18/2020 SLP Diet Recommendations Thin liquid;Dysphagia 2 (Fine chop) solids Liquid Administration via Cup;Straw Medication Administration Whole meds with liquid Compensations -- Postural Changes --   CHL IP OTHER RECOMMENDATIONS 11/18/2020 Recommended Consults -- Oral Care Recommendations Oral care BID Other Recommendations --   CHL IP FOLLOW UP RECOMMENDATIONS 11/18/2020 Follow up Recommendations Home health SLP   CHL IP FREQUENCY AND DURATION 10/18/2020 Speech Therapy Frequency (ACUTE ONLY)  min 1 x/week Treatment Duration 1 week      CHL IP ORAL PHASE 11/18/2020 Oral Phase Impaired Oral - Pudding Teaspoon -- Oral - Pudding Cup -- Oral - Honey Teaspoon -- Oral - Honey Cup -- Oral - Nectar Teaspoon -- Oral - Nectar Cup -- Oral - Nectar Straw -- Oral - Thin Teaspoon -- Oral - Thin Cup Lingual/palatal residue Oral - Thin Straw WFL Oral - Puree Delayed oral transit;Lingual/palatal residue Oral - Mech Soft Delayed oral transit;Lingual/palatal residue Oral - Regular Delayed oral transit;Lingual/palatal residue Oral - Multi-Consistency -- Oral - Pill -- Oral Phase - Comment --  CHL IP PHARYNGEAL PHASE 11/18/2020 Pharyngeal Phase WFL Pharyngeal- Pudding Teaspoon -- Pharyngeal -- Pharyngeal- Pudding Cup -- Pharyngeal -- Pharyngeal- Honey Teaspoon -- Pharyngeal -- Pharyngeal- Honey Cup -- Pharyngeal -- Pharyngeal- Nectar Teaspoon -- Pharyngeal -- Pharyngeal- Nectar Cup -- Pharyngeal -- Pharyngeal- Nectar Straw -- Pharyngeal -- Pharyngeal- Thin Teaspoon -- Pharyngeal -- Pharyngeal- Thin Cup -- Pharyngeal -- Pharyngeal- Thin Straw -- Pharyngeal -- Pharyngeal- Puree -- Pharyngeal -- Pharyngeal- Mechanical Soft -- Pharyngeal -- Pharyngeal- Regular -- Pharyngeal -- Pharyngeal- Multi-consistency -- Pharyngeal -- Pharyngeal- Pill -- Pharyngeal -- Pharyngeal Comment --  CHL IP CERVICAL ESOPHAGEAL PHASE 11/18/2020 Cervical Esophageal Phase WFL Pudding Teaspoon -- Pudding Cup -- Honey Teaspoon -- Honey Cup -- Nectar Teaspoon -- Nectar Cup -- Nectar Straw -- Thin Teaspoon -- Thin Cup -- Thin Straw -- Puree -- Mechanical Soft -- Regular -- Multi-consistency -- Pill -- Cervical Esophageal Comment -- [place] Siren 11/18/2020, 5:29 PM    Orbie Pyo Litaker M.Ed Actor Pager [phone] Office [phone]         CLINICAL DATA:  Dysphagia. EXAM: MODIFIED BARIUM SWALLOW TECHNIQUE: Different consistencies of barium were administered orally to the patient by the Speech Pathologist. Imaging of the pharynx  was performed in the lateral projection. The radiologist was present in the fluoroscopy room for this study, providing personal supervision. FLUOROSCOPY TIME:  Fluoroscopy Time:  1 minutes, 58 seconds Radiation Exposure Index (if provided by the fluoroscopic device): 12.59 mGy Number of Acquired Spot Images: 0 COMPARISON:  None. FINDINGS: Thin liquid swallows: Generally normal although at least 1 swallow had some vallecular retention. Puree consistency: Unremarkable Puree with cracker: Somewhat prolonged chewing and question of delayed movement to the back of the throat, but otherwise unremarkable IMPRESSION: 1. Somewhat prolonged chewing with the puree with cracker consistency as noted above. Mild  vallecular retention with 1 of several thin liquid swallows. Otherwise unremarkable. Please refer to the Speech Pathologists report for complete details and recommendations. Electronically Signed   By: Van Clines M.D.   On: 11/18/2020 11:44   IR NEPHROSTOMY PLACEMENT LEFT  Result Date: 11/02/2020 INDICATION: Metastatic prostate cancer, bilateral obstructive hydronephrosis, left nephrostomy has been pulled out. Right nephrostomy is retracted to the kidney margin. EXAM: UNSUCCESSFUL ATTEMPT AT REPLACEMENT OF THE NEPHROSTOMY CATHETERS THROUGH THE EXISTING PERCUTANEOUS TRACTS. SUCCESSFUL BILATERAL NEW STICK 10 FRENCH NEPHROSTOMIES WITH ULTRASOUND FLUOROSCOPIC GUIDANCE COMPARISON:  11/02/2020 MEDICATIONS: 1% LIDOCAINE LOCAL ANESTHESIA/SEDATION: Fentanyl 125 mcg IV; Versed 2.0 mg IV Moderate Sedation Time:  27 minutes The patient was continuously monitored during the procedure by the interventional radiology nurse under my direct supervision. CONTRAST:  55 cc omni 300-administered into the collecting system(s) FLUOROSCOPY TIME:  Fluoroscopy Time: 6 minutes 18 seconds (64 mGy). COMPLICATIONS: None immediate. PROCEDURE: Informed written consent was obtained from the patient after a thorough discussion of the  procedural risks, benefits and alternatives. All questions were addressed. Maximal Sterile Barrier Technique was utilized including caps, mask, sterile gowns, sterile gloves, sterile drape, hand hygiene and skin antiseptic. A timeout was performed prior to the initiation of the procedure. Initially, attempts were made to recannulate the left flank percutaneous tract. Catheter and guidewire manipulation resulted in extravasation of contrast within the perinephric retroperitoneal soft tissues. Despite multiple attempts, the percutaneous tract could not be recannulated. Under sterile conditions and local anesthesia, ultrasound percutaneous needle access performed of a dilated lower pole calyx with an 18 gauge 15 cm needle. Needle position confirmed with ultrasound. There was return of clear urine. Amplatz guidewire inserted followed by 10 French tract dilatation to insert a 10 [place] new nephrostomy. Retention loop formed the renal pelvis. Contrast injection confirms position. Images obtained for documentation. Catheter secured with a 0 silk suture and connected to external gravity drainage bag. Sterile dressing applied. No immediate complication. Patient tolerated the procedure well. In a similar fashion, the retracted malposition right nephrostomy was cut and removed over a Amplatz guidewire. Kumpe catheter and multiple guidewires were utilized to re-attempt access into the right kidney through the existing percutaneous tract. This was also unsuccessful. Under sterile conditions and local anesthesia, ultrasound percutaneous needle access performed of a dilated mid to lower pole calyx with an 18 gauge 15 cm needle. There was return of urine. Images obtained for documentation. Amplatz guidewire inserted followed by tract dilatation to insert a new 10 French nephrostomy. Retention loop formed in the right renal pelvis. Contrast injection confirms position. Images again obtained for documentation. Catheter secured with 0  silk suture and connected to external gravity drainage bag. Sterile dressing applied. Patient tolerated the procedure well. IMPRESSION: Successful 10 French bilateral nephrostomies as above. Electronically Signed   By: Jerilynn Mages.  Shick M.D.   On: 11/02/2020 16:48   IR NEPHROSTOMY PLACEMENT RIGHT  Result Date: 11/02/2020 INDICATION: Metastatic prostate cancer, bilateral obstructive hydronephrosis, left nephrostomy has been pulled out. Right nephrostomy is retracted to the kidney margin. EXAM: UNSUCCESSFUL ATTEMPT AT REPLACEMENT OF THE NEPHROSTOMY CATHETERS THROUGH THE EXISTING PERCUTANEOUS TRACTS. SUCCESSFUL BILATERAL NEW STICK 10 FRENCH NEPHROSTOMIES WITH ULTRASOUND FLUOROSCOPIC GUIDANCE COMPARISON:  11/02/2020 MEDICATIONS: 1% LIDOCAINE LOCAL ANESTHESIA/SEDATION: Fentanyl 125 mcg IV; Versed 2.0 mg IV Moderate Sedation Time:  27 minutes The patient was continuously monitored during the procedure by the interventional radiology nurse under my direct supervision. CONTRAST:  55 cc omni 300-administered into the collecting system(s) FLUOROSCOPY TIME:  Fluoroscopy Time: 6 minutes  18 seconds (64 mGy). COMPLICATIONS: None immediate. PROCEDURE: Informed written consent was obtained from the patient after a thorough discussion of the procedural risks, benefits and alternatives. All questions were addressed. Maximal Sterile Barrier Technique was utilized including caps, mask, sterile gowns, sterile gloves, sterile drape, hand hygiene and skin antiseptic. A timeout was performed prior to the initiation of the procedure. Initially, attempts were made to recannulate the left flank percutaneous tract. Catheter and guidewire manipulation resulted in extravasation of contrast within the perinephric retroperitoneal soft tissues. Despite multiple attempts, the percutaneous tract could not be recannulated. Under sterile conditions and local anesthesia, ultrasound percutaneous needle access performed of a dilated lower pole calyx with an  18 gauge 15 cm needle. Needle position confirmed with ultrasound. There was return of clear urine. Amplatz guidewire inserted followed by 10 French tract dilatation to insert a 10 [place] new nephrostomy. Retention loop formed the renal pelvis. Contrast injection confirms position. Images obtained for documentation. Catheter secured with a 0 silk suture and connected to external gravity drainage bag. Sterile dressing applied. No immediate complication. Patient tolerated the procedure well. In a similar fashion, the retracted malposition right nephrostomy was cut and removed over a Amplatz guidewire. Kumpe catheter and multiple guidewires were utilized to re-attempt access into the right kidney through the existing percutaneous tract. This was also unsuccessful. Under sterile conditions and local anesthesia, ultrasound percutaneous needle access performed of a dilated mid to lower pole calyx with an 18 gauge 15 cm needle. There was return of urine. Images obtained for documentation. Amplatz guidewire inserted followed by tract dilatation to insert a new 10 French nephrostomy. Retention loop formed in the right renal pelvis. Contrast injection confirms position. Images again obtained for documentation. Catheter secured with 0 silk suture and connected to external gravity drainage bag. Sterile dressing applied. Patient tolerated the procedure well. IMPRESSION: Successful 10 French bilateral nephrostomies as above. Electronically Signed   By: Jerilynn Mages.  Shick M.D.   On: 11/02/2020 16:48   IR NEPHROSTOMY EXCHANGE RIGHT  Result Date: 10/25/2020 INDICATION: 70 year old with prostate cancer and bilateral nephrostomy tubes. Patient has had leakage from the right nephrostomy tube. Recent CT demonstrates the right nephrostomy tube may be partially dislodged. EXAM: RIGHT NEPHROSTOMY TUBE EXCHANGE WITH FLUOROSCOPY Physician: Stephan Minister. Anselm Pancoast, MD COMPARISON:  CT abdomen 10/25/2020 MEDICATIONS: 1% lidocaine, local anesthetic  ANESTHESIA/SEDATION: None CONTRAST:  21mL OMNIPAQUE IOHEXOL 300 MG/ML SOLN - administered into the collecting system(s) FLUOROSCOPY TIME:  Fluoroscopy Time: 1 minutes, 54 seconds, 10 mGy COMPLICATIONS: None immediate. PROCEDURE: The procedure was explained to the patient. The risks and benefits of the procedure were discussed and the patient's questions were addressed. Informed consent was obtained from the patient. Patient was placed prone. The right nephrostomy tube and surrounding skin were prepped and draped in sterile fashion. Drain was injected with a small amount of contrast. The catheter retention suture was cut. The catheter was cut and removed over a superstiff Amplatz wire. A Bettey Mare catheter was advanced over the wire and positioned in the renal pelvis. Pigtail was felt to be too small for the collecting system and at risk for dislodgement again. Therefore, the catheter was switched out for a 10 [place] multipurpose drain which was reconstituted in the renal pelvis. Skin was anesthetized with 1% lidocaine and the catheter was sutured to skin. Catheter was attached to a gravity bag. Fluoroscopic images were taken and saved for this procedure. FINDINGS: The old nephrostomy tube was near the lower pole calyx and partially dislodged. New 10  French tube is well positioned in the renal pelvis. IMPRESSION: Successful exchange of the right percutaneous nephrostomy tube. Electronically Signed   By: Markus Daft M.D.   On: 10/25/2020 17:26    Labs:  CBC: Recent Labs    10/30/20 0005 11/02/20 1334 11/06/20 1518 11/14/20 1146  WBC 16.6* 15.2* 15.9* 14.0*  HGB 9.6* 9.4* 9.2* 8.3*  HCT 30.1* 30.1* 28.9* 26.8*  PLT 421* 385 303 365    COAGS: No results for input(s): INR, APTT in the last 8760 hours.  BMP: Recent Labs    11/29/19 1306 01/12/20 0852 10/28/20 0315 10/29/20 2339 11/02/20 1334 11/14/20 1146  NA 142   < > 132* 132* 139 140  K 3.5   < > 4.1 4.3 4.5 4.0  CL 106   < > 101 101  104 103  CO2 30   < > 22 22 24 28   GLUCOSE 121*   < > 128* 109* 107* 135*  BUN 16   < > 43* 48* 47* 22  CALCIUM 10.1   < > 9.2 9.0 9.6 9.2  CREATININE 1.35*   < > 1.33* 1.13 1.11 1.00  GFRNONAA 53*   < > 58* >60 >60 >60  GFRAA >60  --   --   --   --   --    < > = values in this interval not displayed.    LIVER FUNCTION TESTS: Recent Labs    10/24/20 0433 10/25/20 0328 11/02/20 1334 11/14/20 1146  BILITOT 0.5 0.5 0.5 0.3  AST 30 35 31 35  ALT 53* 52* 52* 45*  ALKPHOS 130* 127* 127* 134*  PROT 7.7 7.4 8.1 7.8  ALBUMIN 2.2* 2.2* 2.5* 2.0*    TUMOR MARKERS: No results for input(s): AFPTM, CEA, CA199, CHROMGRNA in the last 8760 hours.  Assessment and Plan:   Pt is calm, cooperative and pleasant. He is in NAD  He is here today for image guided antegrade bilateral ureteral stent placement  Risks and benefits of bilateral ureteral stent placement was discussed with the patient including, but not limited to, infection, bleeding, significant bleeding causing loss or decrease in renal function or damage to adjacent structures.   All of the patient's questions were answered, patient is agreeable to proceed.  Consent signed and in chart.    Thank you for this interesting consult.  I greatly enjoyed meeting Andre Glass and look forward to participating in their care.  A copy of this report was sent to the requesting provider on this date.  Electronically Signed: Tyson Alias, NP 11/19/2020, 8:25 AM   I spent a total of 30 Minutes in face to face in clinical consultation, greater than 50% of which was counseling/coordinating care for image guided antegrade bilateral ureteral stent placement

## 2020-11-19 NOTE — ED Triage Notes (Signed)
Pt arrives EMS from home after having nephroureteral catheters replacing his previous PCN. Pt endorses bilateral flank pain, lower abdominal pain and inability to urinate.

## 2020-11-19 NOTE — ED Provider Notes (Signed)
Emergency Medicine Provider Triage Evaluation Note  Andre Glass , a 70 y.o. male  was evaluated in triage.  Pt complains of bleeding @ surgical site. Had bilateral PCN conversion to nephroureteral catheters this AM by IR. Discharged home with drains capped & 2 gravity bags. States today he attempted to urinate and could not and that's when he noted some blood @ drainage sites. Felt sore after the procedure but since has had some worsening flank pain. Does not feel he needs to urinate now.   Review of Systems  Positive: Flank pain, blood @ site.  Negative: Vomiting, fever.   Physical Exam  BP 128/65   Pulse 91   Temp 98.8 F (37.1 C)   Resp 18   SpO2 99%  Gen:   Awake, no distress   Resp:  Normal effort  MSK:   Moves extremities without difficulty  Other:  Bilateral flank catheters in place with dressings, blood present on dressings, but no active bleeding.   Medical Decision Making  Medically screening exam initiated at 7:16 PM.  Appropriate orders placed.  Andre Glass was informed that the remainder of the evaluation will be completed by another provider, this initial triage assessment does not replace that evaluation, and the importance of remaining in the ED until their evaluation is complete.  Post op problem.    Leafy Kindle 11/19/20 1930    Charlesetta Shanks, MD 12/08/20 1447

## 2020-11-19 NOTE — ED Notes (Signed)
Pt want his blood work from his port.

## 2020-11-19 NOTE — Progress Notes (Addendum)
Informed by Dr. Dwaine Gale that Andre Glass bilateral PCNs were exchanged to bilateral nephroureteral drains today for   - better security (PCNs have been pulled out/dislodged in the past) - in a hope that the drains may allow Andre Glass to be able to urinate, and he does not need gravity bags attached to the PCNs.   Dr. Dwaine Gale discussed with Dr. [name] prior to the procedure and Dr. [name] agreed with the procedure.    The bilateral nephroureteral drains were capped today.   Informed Mr. Gago niece Andre Glass via telephone that Andre Glass will be discharged home with the drains capped, and he will be discharged with two gravity bags as well.  Asked niece to monitor patient for symptoms like fever, chills, nausea, vomiting, and flank pain, and asked niece to uncap the drains and connect them to gravity bags if Andre Glass develops such symptoms.  Encouraged  Andre Glass to contact IR for further questions and concerns.  Niece verbalized understanding.   Plan: Routine exchange in 12 weeks, our schedulers will call you to set up the appointment.  Please contact IR for any questions and concerns.   Gaines PA-C 11/19/2020 12:05 PM

## 2020-11-19 NOTE — Procedures (Signed)
Interventional Radiology Procedure Note  Procedure: Bilateral PCN conversion to Neproureteral catheters  Indication: Hydronephrosis.  Pelvic mass.  Findings: Please refer to procedural dictation for full description.  Complications: None  EBL: < 10 mL  Miachel Roux, MD [phone]

## 2020-11-20 ENCOUNTER — Emergency Department (HOSPITAL_COMMUNITY): Payer: Medicare Other

## 2020-11-20 ENCOUNTER — Emergency Department (HOSPITAL_BASED_OUTPATIENT_CLINIC_OR_DEPARTMENT_OTHER): Payer: Medicare Other

## 2020-11-20 DIAGNOSIS — I82409 Acute embolism and thrombosis of unspecified deep veins of unspecified lower extremity: Secondary | ICD-10-CM

## 2020-11-20 DIAGNOSIS — I82403 Acute embolism and thrombosis of unspecified deep veins of lower extremity, bilateral: Secondary | ICD-10-CM | POA: Diagnosis not present

## 2020-11-20 DIAGNOSIS — T83098A Other mechanical complication of other indwelling urethral catheter, initial encounter: Secondary | ICD-10-CM | POA: Diagnosis not present

## 2020-11-20 HISTORY — PX: IR NEPHROSTOGRAM LEFT THRU EXISTING ACCESS: IMG6061

## 2020-11-20 HISTORY — PX: IR NEPHROSTOGRAM RIGHT THRU EXISTING ACCESS: IMG6062

## 2020-11-20 LAB — URINALYSIS, ROUTINE W REFLEX MICROSCOPIC
Bilirubin Urine: NEGATIVE
Glucose, UA: NEGATIVE mg/dL
Ketones, ur: NEGATIVE mg/dL
Nitrite: NEGATIVE
Protein, ur: 100 mg/dL — AB
RBC / HPF: >50 RBC/hpf — ABNORMAL HIGH (ref 0–5)
Specific Gravity, Urine: 1.018 (ref 1.005–1.030)
WBC, UA: >50 WBC/hpf — ABNORMAL HIGH (ref 0–5)
pH: 7 (ref 5.0–8.0)

## 2020-11-20 LAB — COMPREHENSIVE METABOLIC PANEL
ALT: 29 U/L (ref 0–44)
AST: 29 U/L (ref 15–41)
Albumin: 2.3 g/dL — ABNORMAL LOW (ref 3.5–5.0)
Alkaline Phosphatase: 111 U/L (ref 38–126)
Anion gap: 10 (ref 5–15)
BUN: 31 mg/dL — ABNORMAL HIGH (ref 8–23)
CO2: 25 mmol/L (ref 22–32)
Calcium: 8.3 mg/dL — ABNORMAL LOW (ref 8.9–10.3)
Chloride: 102 mmol/L (ref 98–111)
Creatinine, Ser: 1.3 mg/dL — ABNORMAL HIGH (ref 0.61–1.24)
GFR, Estimated: 59 mL/min — ABNORMAL LOW (ref >60)
Glucose, Bld: 144 mg/dL — ABNORMAL HIGH (ref 70–99)
Potassium: 3.7 mmol/L (ref 3.5–5.1)
Sodium: 137 mmol/L (ref 135–145)
Total Bilirubin: 0.4 mg/dL (ref 0.3–1.2)
Total Protein: 7.2 g/dL (ref 6.5–8.1)

## 2020-11-20 LAB — CBC WITH DIFFERENTIAL/PLATELET
Abs Immature Granulocytes: 0 10*3/uL (ref 0.00–0.07)
Band Neutrophils: 5 %
Basophils Absolute: 0.2 10*3/uL — ABNORMAL HIGH (ref 0.0–0.1)
Basophils Relative: 1 %
Eosinophils Absolute: 0.2 10*3/uL (ref 0.0–0.5)
Eosinophils Relative: 1 %
HCT: 26.1 % — ABNORMAL LOW (ref 39.0–52.0)
Hemoglobin: 7.9 g/dL — ABNORMAL LOW (ref 13.0–17.0)
Lymphocytes Relative: 2 %
Lymphs Abs: 0.3 10*3/uL — ABNORMAL LOW (ref 0.7–4.0)
MCH: 28.4 pg (ref 26.0–34.0)
MCHC: 30.3 g/dL (ref 30.0–36.0)
MCV: 93.9 fL (ref 80.0–100.0)
Monocytes Absolute: 0 10*3/uL — ABNORMAL LOW (ref 0.1–1.0)
Monocytes Relative: 0 %
Neutro Abs: 14.8 10*3/uL — ABNORMAL HIGH (ref 1.7–7.7)
Neutrophils Relative %: 91 %
Platelets: 268 10*3/uL (ref 150–400)
RBC: 2.78 MIL/uL — ABNORMAL LOW (ref 4.22–5.81)
RDW: 15.7 % — ABNORMAL HIGH (ref 11.5–15.5)
WBC: 15.4 10*3/uL — ABNORMAL HIGH (ref 4.0–10.5)
nRBC: 0 % (ref 0.0–0.2)

## 2020-11-20 MED ORDER — RIVAROXABAN (XARELTO) VTE STARTER PACK (15 & 20 MG): ORAL_TABLET | ORAL | 0 refills | Status: DC

## 2020-11-20 MED ORDER — RIVAROXABAN 20 MG PO TABS
20.0000 mg | ORAL_TABLET | Freq: Every day | ORAL | Status: DC
Start: 2020-12-11 — End: 2020-11-20

## 2020-11-20 MED ORDER — OXYCODONE HCL 5 MG PO TABS
5.0000 mg | ORAL_TABLET | Freq: Once | ORAL | Status: AC
  Administered 2020-11-20: 5 mg via ORAL
  Filled 2020-11-20: qty 1

## 2020-11-20 MED ORDER — SODIUM CHLORIDE 0.9 % IV BOLUS
1000.0000 mL | Freq: Once | INTRAVENOUS | Status: AC
  Administered 2020-11-20: 1000 mL via INTRAVENOUS

## 2020-11-20 MED ORDER — IOHEXOL 350 MG/ML SOLN
100.0000 mL | Freq: Once | INTRAVENOUS | Status: AC | PRN
  Administered 2020-11-20: 65 mL via INTRAVENOUS

## 2020-11-20 MED ORDER — HEPARIN SOD (PORK) LOCK FLUSH 100 UNIT/ML IV SOLN
500.0000 [IU] | Freq: Once | INTRAVENOUS | Status: AC
  Administered 2020-11-20: 500 [IU]
  Filled 2020-11-20: qty 5

## 2020-11-20 MED ORDER — LIDOCAINE HCL 1 % IJ SOLN
INTRAMUSCULAR | Status: AC
  Administered 2020-11-20: 4 mL via SUBCUTANEOUS
  Filled 2020-11-20: qty 20

## 2020-11-20 MED ORDER — CEPHALEXIN 500 MG PO CAPS
500.0000 mg | ORAL_CAPSULE | Freq: Once | ORAL | Status: AC
  Administered 2020-11-20: 500 mg via ORAL
  Filled 2020-11-20: qty 1

## 2020-11-20 MED ORDER — RIVAROXABAN 15 MG PO TABS
15.0000 mg | ORAL_TABLET | Freq: Two times a day (BID) | ORAL | Status: DC
Start: 2020-11-20 — End: 2020-11-20
  Filled 2020-11-20: qty 1

## 2020-11-20 MED ORDER — CEPHALEXIN 500 MG PO CAPS: 500.0000 mg | ORAL_CAPSULE | Freq: Three times a day (TID) | ORAL | 0 refills | Status: DC

## 2020-11-20 MED ORDER — IOHEXOL 300 MG/ML  SOLN
50.0000 mL | Freq: Once | INTRAMUSCULAR | Status: AC | PRN
  Administered 2020-11-20: 10 mL

## 2020-11-20 NOTE — ED Notes (Signed)
Port accessed

## 2020-11-20 NOTE — ED Notes (Signed)
Unable to perform cath due to clotting in urethra.

## 2020-11-20 NOTE — ED Provider Notes (Signed)
  Physical Exam  BP 123/62   Pulse 83   Temp 98.8 F (37.1 C)   Resp 18   SpO2 99%   Physical Exam  ED Course/Procedures     Procedures  MDM  Received care of patient from Dr. Maurie Boettcher.  Please see his note for prior history, physical and care.  Briefly this is a 69 year old male with history of prostate cancer, TURP, lymphadenopathy, mets to the bone, bilateral percutaneous nephrostomy tube placement June 26, replaced July 30, who yesterday had anterograde bilateral ureteral stent placement for metastatic prostate cancer with bilateral obstructive hydronephrosis who presented with concern for leaking urine from his recent nephrostomy tube sites.  Interventional radiology was consulted, and plan to evaluate in the morning.  Creatinine is similar to prior.  Leukocytosis is similar to prior.  I evaluated patient and placed bags.  They do not have any other concerns, feel he is cleared from the IR perspective.  On my exam, he does report left-sided abdominal pain.  Discussed with IR we would not expect this to be a part of his expected postoperative course.  CT abdomen pelvis obtained showing stool distended colon, possible malignant stricture, progressive intra-abdominal metastatic disease, extensive osseous metastatic disease, as well as evidence of bilateral lower extremity DVT with pelvic veins being compressed by pelvic adenopathy.  DVT ultrasound was ordered to confirm suspicion on CT and shows acute DVT right peroneal veins, as well as acute DVT involving the left common femoral vein, SF junction, left proximal profunda, left popliteal vein, posterior tibial veins and peroneal veins.  Discussed with Dr. Trula Slade.  He is not having significant symptoms. Recommends anticoagulation and outpatient follow up.    Discussed this with patient. No hx of bleeding. Started on xarelto.  Recommend close follow up with PCP, recheck of anemia, follow up with vascular, follow up urology.  Wrote rx for  keflex given abdominal pain with urine with possible infection.       Gareth Morgan, MD 11/20/20 2127

## 2020-11-20 NOTE — ED Provider Notes (Signed)
[facility] Emergency Department Provider Note MRN:  [id number]  Arrival date & time: 11/20/20     Chief Complaint   Post-op Problem   History of Present Illness   [name] is a 70 y.o. year-old male with a history of prostate cancer presenting to the ED with chief complaint of postop problem.  Patient is having leaking from his neph tube sites.  Had his tubes replaced today by IR.  Endorsing some mild bilateral flank pain as well, constant, no exacerbating or alleviating factors.  Has not been urinating from below yet.  Denies any fever, no other complaints.  Review of Systems  A complete 10 system review of systems was obtained and all systems are negative except as noted in the HPI and PMH.   Patient's Health History    Past Medical History:  Diagnosis Date   Anemia    History of kidney stones    Prostate cancer ([place])    UTI (urinary tract infection)     Past Surgical History:  Procedure Laterality Date   IR IMAGING GUIDED PORT INSERTION  01/31/2020   IR NEPHROSTOMY EXCHANGE RIGHT  10/25/2020   IR NEPHROSTOMY PLACEMENT LEFT  09/29/2020   IR NEPHROSTOMY PLACEMENT LEFT  11/02/2020   IR NEPHROSTOMY PLACEMENT RIGHT  09/29/2020   IR NEPHROSTOMY PLACEMENT RIGHT  11/02/2020   TRANSURETHRAL RESECTION OF PROSTATE N/A 11/08/2019   Procedure: TRANSURETHRAL RESECTION OF THE PROSTATE (TURP);  Surgeon: [name], MD;  Location: [facility] ORS;  Service: Urology;  Laterality: N/A;    Family History  Problem Relation Age of Onset   Stomach cancer Mother    Pancreatic cancer Sister    Breast cancer Neg Hx    Prostate cancer Neg Hx    Colon cancer Neg Hx     Social History   Socioeconomic History   Marital status: Single    Spouse name: Not on file   Number of children: 0   Years of education: Not on file   Highest education level: Not on file  Occupational History   Not on file  Tobacco Use   Smoking status: Never   Smokeless tobacco: Never  Vaping  Use   Vaping Use: Never used  Substance and Sexual Activity   Alcohol use: Never   Drug use: Never   Sexual activity: Not on file  Other Topics Concern   Not on file  Social History Narrative   Not on file   Social Determinants of Health   Financial Resource Strain: Not on file  Food Insecurity: Not on file  Transportation Needs: Not on file  Physical Activity: Not on file  Stress: Not on file  Social Connections: Not on file  Intimate Partner Violence: Not on file     Physical Exam   Vitals:   11/20/20 0145 11/20/20 0200  BP: 131/70 117/68  Pulse: 88 90  Resp:  16  Temp:    SpO2: 98% 100%    CONSTITUTIONAL: Well-appearing, NAD NEURO:  Alert and oriented x 3, no focal deficits EYES:  eyes equal and reactive ENT/NECK:  no LAD, no JVD CARDIO: Regular rate, well-perfused, normal S1 and S2 PULM:  CTAB no wheezing or rhonchi GI/GU:  normal bowel sounds, non-distended, non-tender MSK/SPINE:  No gross deformities, no edema SKIN: Bilateral nephrostomy tubes and bilateral flanks, sites are normal-appearing, no erythema, no tenderness, leaking of urine from each site [facility]:  Appropriate speech and behavior  *Additional and/or pertinent findings included in  MDM below  Diagnostic and Interventional Summary    EKG Interpretation  Date/Time:    Ventricular Rate:    PR Interval:    QRS Duration:   QT Interval:    QTC Calculation:   R Axis:     Text Interpretation:         Labs Reviewed  COMPREHENSIVE METABOLIC PANEL - Abnormal; Notable for the following components:      Result Value   Glucose, Bld 144 (*)    BUN 31 (*)    Creatinine, Ser 1.30 (*)    Calcium 8.3 (*)    Albumin 2.3 (*)    GFR, Estimated 59 (*)    All other components within normal limits  CBC WITH DIFFERENTIAL/PLATELET - Abnormal; Notable for the following components:   WBC 15.4 (*)    RBC 2.78 (*)    Hemoglobin 7.9 (*)    HCT 26.1 (*)    RDW 15.7 (*)    Neutro Abs 14.8 (*)    Lymphs Abs  0.3 (*)    Monocytes Absolute 0.0 (*)    Basophils Absolute 0.2 (*)    All other components within normal limits  URINALYSIS, ROUTINE W REFLEX MICROSCOPIC    DG Abd 2 Views  Final Result      Medications  oxyCODONE (Oxy IR/ROXICODONE) immediate release tablet 5 mg (5 mg Oral Given 11/20/20 0123)     Procedures  /  Critical Care Procedures  ED Course and Medical Decision Making  I have reviewed the triage vital signs, the nursing notes, and pertinent available records from the EMR.  Listed above are laboratory and imaging tests that I personally ordered, reviewed, and interpreted and then considered in my medical decision making (see below for details).  Patient is leaking urine from his neph tube sites.  I discussed the case with Dr. Dwaine Gale of interventional radiology.  Patient is supposed to be using nephrostomy tube bags at home if this were to happen, but patient did not use them, did not bring them with him.  Plan is to obtain plain film to ensure proper placement of these new nephrostomy tubes, which extend all the way into the bladder.  We will also obtain bladder scan.  Pressure from a full bladder could be causing the urine to be flowing backward.  Plan is to keep patient overnight to have IR evaluate and educate patient in the morning.  Signed out to oncoming provider at shift change.       Barth Kirks. Sedonia Small, [place] [email or web address]  Final Clinical Impressions(s) / ED Diagnoses     ICD-10-CM   1. Malfunction of nephrostomy tube ([place])  T83.098A     2. Nephropathy  N28.9 DG Abd 2 Views    DG Abd 2 Views      ED Discharge Orders     None        Discharge Instructions Discussed with and Provided to Patient:   Discharge Instructions   None       Maudie Flakes, MD 11/20/20 [phone]

## 2020-11-20 NOTE — ED Notes (Signed)
Patient reports surgery for stent was performed this morning. Has only had some lemonade and a bowl of soup since surgery. Currently not producing urine since surgery.

## 2020-11-20 NOTE — Progress Notes (Signed)
BLE venous duplex and IVC/iliac duplex has been completed.  Preliminary findings given to Dr. Billy Fischer.  Results can be found under chart review under CV PROC. 11/20/2020 2:10 PM Derric Dealmeida RVT, RDMS

## 2020-11-20 NOTE — ED Provider Notes (Signed)
Patient was just discharged from the emergency room.  The secretary informed me that patient was on the phone and they were unable to get the anticoagulant from the pharmacy it was sent to and are requesting it be changed to the [facility] [place] on [address] as otherwise he will not be able to start the medicine until tomorrow.  Dr. Billy Fischer had prescribed Xarelto.  This Rx was changed to the requested pharmacy.    Note: Portions of this report may have been transcribed using voice recognition software. Every effort was made to ensure accuracy; however, inadvertent computerized transcription errors may be present        Lorin Glass, PA-C 11/20/20 1706    Gareth Morgan, MD 11/22/20 [phone]

## 2020-11-20 NOTE — Discharge Instructions (Addendum)
Information on my medicine - XARELTO® (rivaroxaban) ° °WHY WAS XARELTO® PRESCRIBED FOR YOU? °Xarelto® was prescribed to treat blood clots that may have been found in the veins of your legs (deep vein thrombosis) or in your lungs (pulmonary embolism) and to reduce the risk of them occurring again. ° °What do you need to know about Xarelto®? °The starting dose is one 15 mg tablet taken TWICE daily with food for the FIRST 21 DAYS then the dose is changed to one 20 mg tablet taken ONCE A DAY with your evening meal. ° °DO NOT stop taking Xarelto® without talking to the health care provider who prescribed the medication.  Refill your prescription for 20 mg tablets before you run out. ° °After discharge, you should have regular check-up appointments with your healthcare provider that is prescribing your Xarelto®.  In the future your dose may need to be changed if your kidney function changes by a significant amount. ° °What do you do if you miss a dose? °If you are taking Xarelto® TWICE DAILY and you miss a dose, take it as soon as you remember. You may take two 15 mg tablets (total 30 mg) at the same time then resume your regularly scheduled 15 mg twice daily the next day. ° °If you are taking Xarelto® ONCE DAILY and you miss a dose, take it as soon as you remember on the same day then continue your regularly scheduled once daily regimen the next day. Do not take two doses of Xarelto® at the same time.  ° °Important Safety Information °Xarelto® is a blood thinner medicine that can cause bleeding. You should call your healthcare provider right away if you experience any of the following: °? Bleeding from an injury or your nose that does not stop. °? Unusual colored urine (red or dark brown) or unusual colored stools (red or black). °? Unusual bruising for unknown reasons. °? A serious fall or if you hit your head (even if there is no bleeding). ° °Some medicines may interact with Xarelto® and might increase your risk of  bleeding while on Xarelto®. To help avoid this, consult your healthcare provider or pharmacist prior to using any new prescription or non-prescription medications, including herbals, vitamins, non-steroidal anti-inflammatory drugs (NSAIDs) and supplements. ° °This website has more information on Xarelto®: www.xarelto.com. ° °

## 2020-11-20 NOTE — Procedures (Signed)
Interventional Radiology Procedure Note  Procedure: Bilateral nephroureteral drain evaluation  Indication: Leaking urine around drain entry site.  Findings:  Drains are in appropriate position. Drains connected to bag and sutured to skin. This should solve any issues with leaking around skin entry site.  Please refer to procedural dictation for full description.  Complications: None  EBL: < 10 mL  Miachel Roux, MD [phone]

## 2020-11-20 NOTE — Progress Notes (Signed)
ANTICOAGULATION CONSULT NOTE - Initial Consult  Pharmacy Consult for Xarelto Indication: DVT - bilateral  Allergies  Allergen Reactions   Ibuprofen Anaphylaxis   Shrimp [Shellfish Allergy] Anaphylaxis    Patient Measurements:   Heparin Dosing Weight:   Vital Signs: BP: 132/71 (08/17 1430) Pulse Rate: 87 (08/17 1430)  Labs: Recent Labs    11/20/20 0017  HGB 7.9*  HCT 26.1*  PLT 268  CREATININE 1.30*    Estimated Creatinine Clearance: 55.6 mL/min (A) (by C-G formula based on SCr of 1.3 mg/dL (H)).   Medical History: Past Medical History:  Diagnosis Date   Anemia    History of kidney stones    Prostate cancer ([place])    UTI (urinary tract infection)     Assessment: 47 w/ M with acute B DVTs. Pharmacy consulted to dose Xarelto.  8/17 Bilateral PCN conversion to Nephroureteral catheters. EBL < 10 ml 8/17 Hg 7.9 low. PLT WNL SCr 1.3  Goal of Therapy:  Monitor platelets by anticoagulation protocol: Yes   Plan:  Xarelto 15 mg po bid with food x 21 days followed by Xarelto 20 mg qsupper. Will provide education & 30 day free card prior to discharge  Eudelia Bunch, Pharm.D 11/20/2020 2:40 PM

## 2020-11-21 ENCOUNTER — Telehealth: Payer: Self-pay

## 2020-11-21 ENCOUNTER — Other Ambulatory Visit: Payer: Self-pay | Admitting: Family Medicine

## 2020-11-21 LAB — URINE CULTURE

## 2020-11-21 NOTE — Telephone Encounter (Signed)
Patient's friend, Dola Argyle, calls nurse line regarding follow up from recent ED visit. Patient was evaluated in [facility] ED on 8/16 and was diagnosed with DVT.   Upon discharge was advised to follow up with PCP office for management of Xarelto. Precepted with Dr. Owens Shark. Advised scheduling with clinical pharmacist, Ernst Bowler, next week and the following week with provider.   Scheduled with Rachelle on 8/22 and with PCP on 8/30. Advised friend of ED precautions.   Talbot Grumbling, RN

## 2020-11-25 ENCOUNTER — Ambulatory Visit (INDEPENDENT_AMBULATORY_CARE_PROVIDER_SITE_OTHER): Payer: Medicare Other | Admitting: Pharmacist

## 2020-11-25 ENCOUNTER — Other Ambulatory Visit: Payer: Self-pay

## 2020-11-25 DIAGNOSIS — D649 Anemia, unspecified: Secondary | ICD-10-CM

## 2020-11-25 DIAGNOSIS — I82403 Acute embolism and thrombosis of unspecified deep veins of lower extremity, bilateral: Secondary | ICD-10-CM | POA: Diagnosis not present

## 2020-11-25 DIAGNOSIS — Z79899 Other long term (current) drug therapy: Secondary | ICD-10-CM | POA: Diagnosis not present

## 2020-11-25 NOTE — Progress Notes (Signed)
   Subjective:    [name], male    DOB: 07-13-1950, 70 y.o.   MRN: [id number]  HPI  Patient is a 70 y.o. male who presents for medication review and management. He is in good spirits and presents with assistance of wheelchair and caregiver. Patient was referred on 11/21/20 and last seen by Primary Care Provider on 11/06/20.  Patient presented to ED on 11/19/20 and found to have bilateral DVT. Patient started on Xarelto and began taking on 11/21/20.   Medication Adherence Questionnaire (A score of 2 or more points indicates risk for nonadherence)  Do you know what each of your medicines is for? 0 (1 point if no)  Do you ever have trouble remembering to take your medicine? 0; "sets alarms" (2 points if yes)  Do you ever not take a medicine because you feel you do not need it?  0 (1 point if yes)  Do you think that any of your medicines is not helping you? 0 (1 point if yes)  Do you have any physical problems such as vision loss that keep you from taking your medicines as prescribed?  2; cost of Linzess (2 points if yes)  Do you think any of your medicine is causing a side effect?  0 (1 point if yes)  Do you know the names of ALL of your medicines? 0 (1 point if no)  Do you think that you need ALL of your medicines? 0 (1 point if no)  In the past 6 months, have you missed getting a refill or a new prescription filled on time? 0 (1 point if yes)  How often do you miss taking a dose of medicine? 0 Never (0 points), 1 or 2 times a month (1 points), 1 time a week (2 points), 2 or more times a week (3 points).   TOTAL SCORE 2/14    Objective:   Labs:   Physical Exam Neurological:     Mental Status: He is alert and oriented to person, place, and time.    Review of Systems  Respiratory:  Negative for shortness of breath.   Cardiovascular:  Positive for leg swelling. Negative for chest pain and orthopnea.       Continues to endorse bilateral leg swelling since ED visit on 11/19/20  in which it was confirmed patient had DVT. Patient denies any leg pain.   Assessment/Plan:   Understanding of regimen: good  Understanding of indications: good  Potential of compliance: good  Patient has known adherence challenges based on score of 2 for questionnaire. Barriers include: cost of Linzess which was $500 at pharmacy. Patient was able to purchase but states it would be difficult to continue to do so. Provided patient pill organizer. Medication list reviewed and updated. Patient was provided with a printed medication list.   Discussed with Dr. Ardelia Mems patient's bilateral lower leg edema. Patient has follow-up in access to care clinic next week. No immediate concerns at this time. Provided ED precautions.  Follow-up appointment next week. Written patient instructions provided.  This appointment required 34 minutes of direct patient care.  Thank you for involving pharmacy to assist in providing this patient's care.

## 2020-11-25 NOTE — Patient Instructions (Signed)
Andre Glass it was a pleasure seeing you today.   Today we reviewed all of the medications you are currently taking. Included is an updated medication list. Please continue taking all medications as prescribed on this list.  To help you remember to take your medications:  - Supplied you with a pill organizer  If you have any questions please call the clinic and ask to speak with me.  Follow-up with provider next week.

## 2020-11-26 ENCOUNTER — Encounter: Payer: Self-pay | Admitting: Oncology

## 2020-11-26 ENCOUNTER — Telehealth: Payer: Self-pay | Admitting: Family Medicine

## 2020-11-26 LAB — CBC WITH DIFFERENTIAL/PLATELET
Basophils Absolute: 0 10*3/uL (ref 0.0–0.2)
Basos: 0 %
EOS (ABSOLUTE): 0.3 10*3/uL (ref 0.0–0.4)
Eos: 1 %
Hematocrit: 23.9 % — ABNORMAL LOW (ref 37.5–51.0)
Hemoglobin: 7.5 g/dL — ABNORMAL LOW (ref 13.0–17.7)
Lymphocytes Absolute: 3.3 10*3/uL — ABNORMAL HIGH (ref 0.7–3.1)
Lymphs: 11 %
MCH: 28.1 pg (ref 26.6–33.0)
MCHC: 31.4 g/dL — ABNORMAL LOW (ref 31.5–35.7)
MCV: 90 fL (ref 79–97)
Monocytes Absolute: 0.9 10*3/uL (ref 0.1–0.9)
Monocytes: 3 %
NRBC: 3 % — ABNORMAL HIGH (ref 0–0)
Neutrophils Absolute: 24 10*3/uL — ABNORMAL HIGH (ref 1.4–7.0)
Neutrophils: 80 %
Platelets: 443 10*3/uL (ref 150–450)
RBC: 2.67 x10E6/uL — CL (ref 4.14–5.80)
RDW: 15.2 % (ref 11.6–15.4)
WBC: 30 10*3/uL (ref 3.4–10.8)

## 2020-11-26 LAB — BASIC METABOLIC PANEL
BUN/Creatinine Ratio: 21 (ref 10–24)
BUN: 23 mg/dL (ref 8–27)
CO2: 21 mmol/L (ref 20–29)
Calcium: 9.5 mg/dL (ref 8.6–10.2)
Chloride: 106 mmol/L (ref 96–106)
Creatinine, Ser: 1.11 mg/dL (ref 0.76–1.27)
Glucose: 139 mg/dL — ABNORMAL HIGH (ref 65–99)
Potassium: 4.2 mmol/L (ref 3.5–5.2)
Sodium: 143 mmol/L (ref 134–144)
eGFR: 71 mL/min/{1.73_m2} (ref >59)

## 2020-11-26 LAB — IMMATURE CELLS
MYELOCYTES: 3 % — ABNORMAL HIGH (ref 0–0)
Metamyelocytes: 2 % — ABNORMAL HIGH (ref 0–0)

## 2020-11-26 NOTE — Telephone Encounter (Signed)
Spoke with patient after noting the elevated white blood cell count on his recent labs.  Patient denies any shortness of breath, new cough, change in abdominal or back pain, or other concerns.  He states that he still has his nephrostomy bags but has not noticed any difference in pain in his back region.  I discussed with the patient that if he develops any symptoms concerning for an infection he should have a low threshold to go to the emergency department due to his white blood cell count of 30.  I also informed the patient that I would forward these labs to his oncologist in case this is simply a side effect of his chemotherapy.

## 2020-11-26 NOTE — Telephone Encounter (Signed)
Attempted to call patient to discuss the significantly elevated white blood cell count on his recent lab draw.  We will plan to reach out to the patient later today.  I did leave a HIPAA compliant voicemail stating that I had called to discuss lab values.  When I call patient later today I will check to see if he is having any shortness of breath, chest pains, new urinary complaints or other significant complaints that could suggest signs of infection.

## 2020-11-26 NOTE — Telephone Encounter (Signed)
-----   Message from Martyn Malay, MD sent at 11/26/2020  8:37 AM EDT -----  ----- Message ----- From: Lavone Neri Lab Results In Sent: 11/26/2020   8:13 AM EDT To: Martyn Malay, MD

## 2020-11-27 ENCOUNTER — Inpatient Hospital Stay (HOSPITAL_COMMUNITY)
Admission: EM | Admit: 2020-11-27 | Discharge: 2020-12-20 | DRG: 329 | Disposition: A | Discharge status: Other Acute Inpatient Hospital | Payer: Medicare Other | Attending: Internal Medicine | Admitting: Internal Medicine

## 2020-11-27 ENCOUNTER — Encounter (HOSPITAL_COMMUNITY): Payer: Self-pay | Admitting: Emergency Medicine

## 2020-11-27 ENCOUNTER — Emergency Department (HOSPITAL_COMMUNITY): Payer: Medicare Other

## 2020-11-27 ENCOUNTER — Other Ambulatory Visit: Payer: Self-pay

## 2020-11-27 DIAGNOSIS — C772 Secondary and unspecified malignant neoplasm of intra-abdominal lymph nodes: Secondary | ICD-10-CM | POA: Diagnosis present

## 2020-11-27 DIAGNOSIS — J90 Pleural effusion, not elsewhere classified: Secondary | ICD-10-CM | POA: Diagnosis not present

## 2020-11-27 DIAGNOSIS — Z192 Hormone resistant malignancy status: Secondary | ICD-10-CM | POA: Diagnosis present

## 2020-11-27 DIAGNOSIS — K631 Perforation of intestine (nontraumatic): Secondary | ICD-10-CM | POA: Diagnosis present

## 2020-11-27 DIAGNOSIS — G893 Neoplasm related pain (acute) (chronic): Secondary | ICD-10-CM | POA: Diagnosis present

## 2020-11-27 DIAGNOSIS — B965 Pseudomonas (aeruginosa) (mallei) (pseudomallei) as the cause of diseases classified elsewhere: Secondary | ICD-10-CM | POA: Diagnosis not present

## 2020-11-27 DIAGNOSIS — K651 Peritoneal abscess: Secondary | ICD-10-CM

## 2020-11-27 DIAGNOSIS — K5900 Constipation, unspecified: Secondary | ICD-10-CM

## 2020-11-27 DIAGNOSIS — R000 Tachycardia, unspecified: Secondary | ICD-10-CM | POA: Diagnosis not present

## 2020-11-27 DIAGNOSIS — R188 Other ascites: Secondary | ICD-10-CM | POA: Diagnosis not present

## 2020-11-27 DIAGNOSIS — E870 Hyperosmolality and hypernatremia: Secondary | ICD-10-CM | POA: Diagnosis not present

## 2020-11-27 DIAGNOSIS — E875 Hyperkalemia: Secondary | ICD-10-CM | POA: Diagnosis not present

## 2020-11-27 DIAGNOSIS — Z9079 Acquired absence of other genital organ(s): Secondary | ICD-10-CM

## 2020-11-27 DIAGNOSIS — Z800 Family history of malignant neoplasm of digestive organs: Secondary | ICD-10-CM

## 2020-11-27 DIAGNOSIS — E876 Hypokalemia: Secondary | ICD-10-CM | POA: Diagnosis present

## 2020-11-27 DIAGNOSIS — Z7901 Long term (current) use of anticoagulants: Secondary | ICD-10-CM

## 2020-11-27 DIAGNOSIS — Z0189 Encounter for other specified special examinations: Secondary | ICD-10-CM

## 2020-11-27 DIAGNOSIS — R109 Unspecified abdominal pain: Secondary | ICD-10-CM | POA: Diagnosis not present

## 2020-11-27 DIAGNOSIS — Z886 Allergy status to analgesic agent status: Secondary | ICD-10-CM

## 2020-11-27 DIAGNOSIS — N400 Enlarged prostate without lower urinary tract symptoms: Secondary | ICD-10-CM | POA: Diagnosis present

## 2020-11-27 DIAGNOSIS — R310 Gross hematuria: Secondary | ICD-10-CM | POA: Diagnosis present

## 2020-11-27 DIAGNOSIS — I96 Gangrene, not elsewhere classified: Secondary | ICD-10-CM | POA: Diagnosis not present

## 2020-11-27 DIAGNOSIS — Z4659 Encounter for fitting and adjustment of other gastrointestinal appliance and device: Secondary | ICD-10-CM

## 2020-11-27 DIAGNOSIS — R54 Age-related physical debility: Secondary | ICD-10-CM | POA: Diagnosis not present

## 2020-11-27 DIAGNOSIS — D649 Anemia, unspecified: Secondary | ICD-10-CM | POA: Diagnosis not present

## 2020-11-27 DIAGNOSIS — D630 Anemia in neoplastic disease: Secondary | ICD-10-CM | POA: Diagnosis present

## 2020-11-27 DIAGNOSIS — D62 Acute posthemorrhagic anemia: Secondary | ICD-10-CM | POA: Diagnosis not present

## 2020-11-27 DIAGNOSIS — C61 Malignant neoplasm of prostate: Secondary | ICD-10-CM | POA: Diagnosis present

## 2020-11-27 DIAGNOSIS — Z86718 Personal history of other venous thrombosis and embolism: Secondary | ICD-10-CM

## 2020-11-27 DIAGNOSIS — E440 Moderate protein-calorie malnutrition: Secondary | ICD-10-CM | POA: Insufficient documentation

## 2020-11-27 DIAGNOSIS — K658 Other peritonitis: Secondary | ICD-10-CM

## 2020-11-27 DIAGNOSIS — Z7952 Long term (current) use of systemic steroids: Secondary | ICD-10-CM

## 2020-11-27 DIAGNOSIS — Z515 Encounter for palliative care: Secondary | ICD-10-CM | POA: Diagnosis not present

## 2020-11-27 DIAGNOSIS — Z6831 Body mass index (BMI) 31.0-31.9, adult: Secondary | ICD-10-CM | POA: Diagnosis not present

## 2020-11-27 DIAGNOSIS — K5641 Fecal impaction: Secondary | ICD-10-CM | POA: Diagnosis present

## 2020-11-27 DIAGNOSIS — C7951 Secondary malignant neoplasm of bone: Secondary | ICD-10-CM | POA: Diagnosis present

## 2020-11-27 DIAGNOSIS — E872 Acidosis: Secondary | ICD-10-CM | POA: Diagnosis not present

## 2020-11-27 DIAGNOSIS — B9620 Unspecified Escherichia coli [E. coli] as the cause of diseases classified elsewhere: Secondary | ICD-10-CM | POA: Diagnosis not present

## 2020-11-27 DIAGNOSIS — I82412 Acute embolism and thrombosis of left femoral vein: Secondary | ICD-10-CM | POA: Diagnosis present

## 2020-11-27 DIAGNOSIS — R1031 Right lower quadrant pain: Secondary | ICD-10-CM

## 2020-11-27 DIAGNOSIS — M7989 Other specified soft tissue disorders: Secondary | ICD-10-CM | POA: Diagnosis not present

## 2020-11-27 DIAGNOSIS — K9401 Colostomy hemorrhage: Secondary | ICD-10-CM | POA: Diagnosis not present

## 2020-11-27 DIAGNOSIS — N179 Acute kidney failure, unspecified: Secondary | ICD-10-CM | POA: Diagnosis not present

## 2020-11-27 DIAGNOSIS — I959 Hypotension, unspecified: Secondary | ICD-10-CM | POA: Diagnosis not present

## 2020-11-27 DIAGNOSIS — D72829 Elevated white blood cell count, unspecified: Secondary | ICD-10-CM

## 2020-11-27 DIAGNOSIS — Z79899 Other long term (current) drug therapy: Secondary | ICD-10-CM

## 2020-11-27 DIAGNOSIS — Z7189 Other specified counseling: Secondary | ICD-10-CM | POA: Diagnosis not present

## 2020-11-27 DIAGNOSIS — R609 Edema, unspecified: Secondary | ICD-10-CM | POA: Diagnosis not present

## 2020-11-27 DIAGNOSIS — Y838 Other surgical procedures as the cause of abnormal reaction of the patient, or of later complication, without mention of misadventure at the time of the procedure: Secondary | ICD-10-CM | POA: Diagnosis present

## 2020-11-27 DIAGNOSIS — Z20822 Contact with and (suspected) exposure to covid-19: Secondary | ICD-10-CM | POA: Diagnosis present

## 2020-11-27 DIAGNOSIS — L89152 Pressure ulcer of sacral region, stage 2: Secondary | ICD-10-CM | POA: Diagnosis not present

## 2020-11-27 DIAGNOSIS — Z91013 Allergy to seafood: Secondary | ICD-10-CM

## 2020-11-27 DIAGNOSIS — L8990 Pressure ulcer of unspecified site, unspecified stage: Secondary | ICD-10-CM | POA: Insufficient documentation

## 2020-11-27 DIAGNOSIS — R531 Weakness: Secondary | ICD-10-CM | POA: Diagnosis not present

## 2020-11-27 LAB — CBC WITH DIFFERENTIAL/PLATELET
Abs Immature Granulocytes: 0.7 10*3/uL — ABNORMAL HIGH (ref 0.00–0.07)
Band Neutrophils: 5 %
Basophils Absolute: 0 10*3/uL (ref 0.0–0.1)
Basophils Relative: 0 %
Eosinophils Absolute: 0 10*3/uL (ref 0.0–0.5)
Eosinophils Relative: 0 %
HCT: 21.9 % — ABNORMAL LOW (ref 39.0–52.0)
Hemoglobin: 6.6 g/dL — CL (ref 13.0–17.0)
Lymphocytes Relative: 5 %
Lymphs Abs: 1.2 10*3/uL (ref 0.7–4.0)
MCH: 28.9 pg (ref 26.0–34.0)
MCHC: 30.1 g/dL (ref 30.0–36.0)
MCV: 96.1 fL (ref 80.0–100.0)
Monocytes Absolute: 0.7 10*3/uL (ref 0.1–1.0)
Monocytes Relative: 3 %
Myelocytes: 3 %
Neutro Abs: 21.7 10*3/uL — ABNORMAL HIGH (ref 1.7–7.7)
Neutrophils Relative %: 84 %
Platelets: 296 10*3/uL (ref 150–400)
RBC: 2.28 MIL/uL — ABNORMAL LOW (ref 4.22–5.81)
RDW: 17.2 % — ABNORMAL HIGH (ref 11.5–15.5)
WBC: 24.4 10*3/uL — ABNORMAL HIGH (ref 4.0–10.5)
nRBC: 0.9 % — ABNORMAL HIGH (ref 0.0–0.2)

## 2020-11-27 LAB — APTT: aPTT: 34 seconds (ref 24–36)

## 2020-11-27 LAB — COMPREHENSIVE METABOLIC PANEL
ALT: 21 U/L (ref 0–44)
AST: 33 U/L (ref 15–41)
Albumin: 2.3 g/dL — ABNORMAL LOW (ref 3.5–5.0)
Alkaline Phosphatase: 113 U/L (ref 38–126)
Anion gap: 7 (ref 5–15)
BUN: 19 mg/dL (ref 8–23)
CO2: 25 mmol/L (ref 22–32)
Calcium: 8.8 mg/dL — ABNORMAL LOW (ref 8.9–10.3)
Chloride: 112 mmol/L — ABNORMAL HIGH (ref 98–111)
Creatinine, Ser: 0.87 mg/dL (ref 0.61–1.24)
GFR, Estimated: >60 mL/min (ref >60)
Glucose, Bld: 111 mg/dL — ABNORMAL HIGH (ref 70–99)
Potassium: 3 mmol/L — ABNORMAL LOW (ref 3.5–5.1)
Sodium: 144 mmol/L (ref 135–145)
Total Bilirubin: 0.4 mg/dL (ref 0.3–1.2)
Total Protein: 6.6 g/dL (ref 6.5–8.1)

## 2020-11-27 LAB — URINALYSIS, COMPLETE (UACMP) WITH MICROSCOPIC
Bacteria, UA: NONE SEEN
RBC / HPF: >50 RBC/hpf — ABNORMAL HIGH (ref 0–5)

## 2020-11-27 LAB — RESP PANEL BY RT-PCR (FLU A&B, COVID) ARPGX2
Influenza A by PCR: NEGATIVE
Influenza B by PCR: NEGATIVE
SARS Coronavirus 2 by RT PCR: NEGATIVE

## 2020-11-27 LAB — URINALYSIS, ROUTINE W REFLEX MICROSCOPIC
Bacteria, UA: NONE SEEN
Bilirubin Urine: NEGATIVE
Glucose, UA: NEGATIVE mg/dL
Ketones, ur: NEGATIVE mg/dL
Leukocytes,Ua: NEGATIVE
Nitrite: NEGATIVE
Protein, ur: 100 mg/dL — AB
RBC / HPF: >50 RBC/hpf — ABNORMAL HIGH (ref 0–5)
Specific Gravity, Urine: 1.016 (ref 1.005–1.030)
pH: 6 (ref 5.0–8.0)

## 2020-11-27 LAB — PREPARE RBC (CROSSMATCH)

## 2020-11-27 LAB — HEPARIN LEVEL (UNFRACTIONATED): Heparin Unfractionated: 0.91 IU/mL — ABNORMAL HIGH (ref 0.30–0.70)

## 2020-11-27 LAB — LACTIC ACID, PLASMA: Lactic Acid, Venous: 0.8 mmol/L (ref 0.5–1.9)

## 2020-11-27 LAB — MAGNESIUM: Magnesium: 1.5 mg/dL — ABNORMAL LOW (ref 1.7–2.4)

## 2020-11-27 LAB — LIPASE, BLOOD: Lipase: 22 U/L (ref 11–51)

## 2020-11-27 MED ORDER — GERITOL COMPLETE PO TABS: 1.0000 | ORAL_TABLET | Freq: Every day | ORAL | Status: DC

## 2020-11-27 MED ORDER — SENNA 8.6 MG PO TABS
1.0000 | ORAL_TABLET | Freq: Two times a day (BID) | ORAL | Status: DC
  Administered 2020-11-27 – 2020-11-28 (×2): 8.6 mg via ORAL
  Filled 2020-11-27 (×2): qty 1

## 2020-11-27 MED ORDER — KCL IN DEXTROSE-NACL 40-5-0.9 MEQ/L-%-% IV SOLN
INTRAVENOUS | Status: DC
  Filled 2020-11-27 (×7): qty 1000

## 2020-11-27 MED ORDER — PREGABALIN 50 MG PO CAPS
50.0000 mg | ORAL_CAPSULE | Freq: Two times a day (BID) | ORAL | Status: DC
  Administered 2020-11-27 – 2020-11-30 (×5): 50 mg via ORAL
  Filled 2020-11-27 (×8): qty 1

## 2020-11-27 MED ORDER — NALOXEGOL OXALATE 12.5 MG PO TABS
12.5000 mg | ORAL_TABLET | Freq: Every day | ORAL | Status: DC
  Administered 2020-11-28 – 2020-12-05 (×4): 12.5 mg via ORAL
  Filled 2020-11-27 (×8): qty 1

## 2020-11-27 MED ORDER — MAGNESIUM OXIDE -MG SUPPLEMENT 400 (240 MG) MG PO TABS
400.0000 mg | ORAL_TABLET | Freq: Once | ORAL | Status: AC
  Administered 2020-11-27: 400 mg via ORAL
  Filled 2020-11-27: qty 1

## 2020-11-27 MED ORDER — TAMSULOSIN HCL 0.4 MG PO CAPS: 0.4000 mg | ORAL_CAPSULE | Freq: Every day | ORAL | Status: DC | PRN

## 2020-11-27 MED ORDER — SODIUM CHLORIDE 0.9% FLUSH
10.0000 mL | INTRAVENOUS | Status: DC | PRN
  Administered 2020-12-02 – 2020-12-13 (×3): 10 mL

## 2020-11-27 MED ORDER — FLEET ENEMA 7-19 GM/118ML RE ENEM
1.0000 | ENEMA | Freq: Once | RECTAL | Status: AC
  Administered 2020-11-27: 1 via RECTAL
  Filled 2020-11-27: qty 1

## 2020-11-27 MED ORDER — RIVAROXABAN 20 MG PO TABS: 20.0000 mg | ORAL_TABLET | Freq: Every day | ORAL | Status: DC

## 2020-11-27 MED ORDER — CHLORHEXIDINE GLUCONATE 0.12 % MT SOLN
15.0000 mL | Freq: Two times a day (BID) | OROMUCOSAL | Status: DC
  Administered 2020-11-27 – 2020-12-20 (×35): 15 mL via OROMUCOSAL
  Filled 2020-11-27 (×33): qty 15

## 2020-11-27 MED ORDER — HYDROMORPHONE HCL 1 MG/ML IJ SOLN: 0.5000 mg | INTRAMUSCULAR | Status: DC | PRN

## 2020-11-27 MED ORDER — ONDANSETRON HCL 4 MG/2ML IJ SOLN
4.0000 mg | Freq: Once | INTRAMUSCULAR | Status: AC
  Administered 2020-11-27: 4 mg via INTRAVENOUS
  Filled 2020-11-27: qty 2

## 2020-11-27 MED ORDER — DOCUSATE SODIUM 100 MG PO CAPS
100.0000 mg | ORAL_CAPSULE | Freq: Two times a day (BID) | ORAL | Status: DC
  Administered 2020-11-27: 100 mg via ORAL
  Filled 2020-11-27 (×2): qty 1

## 2020-11-27 MED ORDER — LACTULOSE ENEMA
300.0000 mL | Freq: Once | ORAL | Status: AC
  Administered 2020-11-27: 300 mL via RECTAL
  Filled 2020-11-27: qty 300

## 2020-11-27 MED ORDER — IOHEXOL 350 MG/ML SOLN
80.0000 mL | Freq: Once | INTRAVENOUS | Status: AC | PRN
  Administered 2020-11-27: 80 mL via INTRAVENOUS

## 2020-11-27 MED ORDER — ONDANSETRON HCL 4 MG PO TABS: 4.0000 mg | ORAL_TABLET | Freq: Three times a day (TID) | ORAL | Status: DC | PRN

## 2020-11-27 MED ORDER — POLYETHYLENE GLYCOL 3350 17 G PO PACK: 17.0000 g | PACK | Freq: Two times a day (BID) | ORAL | Status: DC

## 2020-11-27 MED ORDER — SODIUM CHLORIDE 0.9 % IV SOLN
10.0000 mL/h | Freq: Once | INTRAVENOUS | Status: AC
  Administered 2020-11-27: 10 mL/h via INTRAVENOUS

## 2020-11-27 MED ORDER — RIVAROXABAN (XARELTO) VTE STARTER PACK (15 & 20 MG): 15.0000 mg | ORAL_TABLET | ORAL | Status: DC

## 2020-11-27 MED ORDER — HYDROCODONE-ACETAMINOPHEN 5-325 MG PO TABS
1.0000 | ORAL_TABLET | ORAL | Status: DC | PRN
  Administered 2020-11-27 – 2020-11-28 (×3): 2 via ORAL
  Filled 2020-11-27 (×3): qty 2

## 2020-11-27 MED ORDER — MAGNESIUM SULFATE 2 GM/50ML IV SOLN
2.0000 g | Freq: Once | INTRAVENOUS | Status: AC
  Administered 2020-11-27: 2 g via INTRAVENOUS
  Filled 2020-11-27: qty 50

## 2020-11-27 MED ORDER — HEPARIN (PORCINE) 25000 UT/250ML-% IV SOLN
1400.0000 [IU]/h | INTRAVENOUS | Status: DC
  Administered 2020-11-27: 1200 [IU]/h via INTRAVENOUS
  Filled 2020-11-27: qty 250

## 2020-11-27 MED ORDER — POTASSIUM CHLORIDE CRYS ER 20 MEQ PO TBCR
40.0000 meq | EXTENDED_RELEASE_TABLET | Freq: Once | ORAL | Status: AC
  Administered 2020-11-27: 40 meq via ORAL
  Filled 2020-11-27: qty 2

## 2020-11-27 MED ORDER — ORAL CARE MOUTH RINSE
15.0000 mL | Freq: Two times a day (BID) | OROMUCOSAL | Status: DC
  Administered 2020-11-28 – 2020-12-20 (×23): 15 mL via OROMUCOSAL

## 2020-11-27 MED ORDER — SODIUM CHLORIDE 0.9 % IV BOLUS
1000.0000 mL | Freq: Once | INTRAVENOUS | Status: AC
  Administered 2020-11-27: 1000 mL via INTRAVENOUS

## 2020-11-27 MED ORDER — MIRTAZAPINE 15 MG PO TABS
7.5000 mg | ORAL_TABLET | Freq: Every day | ORAL | Status: DC
  Administered 2020-11-27 – 2020-12-19 (×17): 7.5 mg via ORAL
  Filled 2020-11-27 (×19): qty 1

## 2020-11-27 MED ORDER — CEPHALEXIN 500 MG PO CAPS
500.0000 mg | ORAL_CAPSULE | Freq: Three times a day (TID) | ORAL | Status: DC
  Administered 2020-11-27 – 2020-11-28 (×2): 500 mg via ORAL
  Filled 2020-11-27 (×2): qty 1

## 2020-11-27 MED ORDER — ACETAMINOPHEN 325 MG PO TABS: 650.0000 mg | ORAL_TABLET | Freq: Four times a day (QID) | ORAL | Status: DC | PRN

## 2020-11-27 MED ORDER — PREDNISONE 5 MG PO TABS
5.0000 mg | ORAL_TABLET | Freq: Every day | ORAL | Status: DC
  Administered 2020-11-28 – 2020-12-17 (×16): 5 mg via ORAL
  Filled 2020-11-27 (×17): qty 1

## 2020-11-27 MED ORDER — MORPHINE SULFATE (PF) 4 MG/ML IV SOLN
4.0000 mg | Freq: Once | INTRAVENOUS | Status: AC
  Administered 2020-11-27: 4 mg via INTRAVENOUS
  Filled 2020-11-27: qty 1

## 2020-11-27 MED ORDER — ONDANSETRON HCL 4 MG/2ML IJ SOLN
4.0000 mg | Freq: Four times a day (QID) | INTRAMUSCULAR | Status: DC | PRN
  Administered 2020-11-28 – 2020-12-19 (×3): 4 mg via INTRAVENOUS
  Filled 2020-11-27 (×2): qty 2

## 2020-11-27 MED ORDER — ASCORBIC ACID 500 MG PO TABS
1000.0000 mg | ORAL_TABLET | Freq: Every day | ORAL | Status: DC
  Administered 2020-11-27 – 2020-12-20 (×19): 1000 mg via ORAL
  Filled 2020-11-27 (×22): qty 2

## 2020-11-27 MED ORDER — RIVAROXABAN 15 MG PO TABS
15.0000 mg | ORAL_TABLET | Freq: Two times a day (BID) | ORAL | Status: DC
  Filled 2020-11-27: qty 1

## 2020-11-27 MED ORDER — SODIUM CHLORIDE 0.9 % IV SOLN: INTRAVENOUS | Status: DC

## 2020-11-27 MED ORDER — ADULT MULTIVITAMIN W/MINERALS CH
1.0000 | ORAL_TABLET | Freq: Every day | ORAL | Status: DC
  Administered 2020-11-27 – 2020-12-20 (×19): 1 via ORAL
  Filled 2020-11-27 (×20): qty 1

## 2020-11-27 MED ORDER — CHLORHEXIDINE GLUCONATE CLOTH 2 % EX PADS
6.0000 | MEDICATED_PAD | Freq: Every day | CUTANEOUS | Status: DC
  Administered 2020-11-28 – 2020-12-20 (×23): 6 via TOPICAL

## 2020-11-27 MED ORDER — ONDANSETRON HCL 4 MG PO TABS: 4.0000 mg | ORAL_TABLET | Freq: Four times a day (QID) | ORAL | Status: DC | PRN

## 2020-11-27 MED ORDER — LINACLOTIDE 145 MCG PO CAPS: 290.0000 ug | ORAL_CAPSULE | Freq: Every day | ORAL | Status: DC | PRN

## 2020-11-27 MED ORDER — HYDROMORPHONE HCL 1 MG/ML IJ SOLN
0.5000 mg | INTRAMUSCULAR | Status: DC | PRN
  Administered 2020-11-28: 1 mg via INTRAVENOUS
  Administered 2020-11-28 – 2020-11-29 (×2): 0.5 mg via INTRAVENOUS
  Administered 2020-11-29 – 2020-11-30 (×9): 1 mg via INTRAVENOUS
  Administered 2020-11-30: 0.5 mg via INTRAVENOUS
  Administered 2020-12-01 (×4): 1 mg via INTRAVENOUS
  Administered 2020-12-01: 0.5 mg via INTRAVENOUS
  Administered 2020-12-02 – 2020-12-10 (×54): 1 mg via INTRAVENOUS
  Filled 2020-11-27 (×73): qty 1

## 2020-11-27 MED ORDER — PLECANATIDE 3 MG PO TABS: 3.0000 mg | ORAL_TABLET | Freq: Every day | ORAL | Status: DC

## 2020-11-27 MED ORDER — PROCHLORPERAZINE MALEATE 10 MG PO TABS
10.0000 mg | ORAL_TABLET | Freq: Four times a day (QID) | ORAL | Status: DC | PRN
  Filled 2020-11-27: qty 1

## 2020-11-27 MED ORDER — ENSURE ENLIVE PO LIQD: 237.0000 mL | Freq: Three times a day (TID) | ORAL | Status: DC

## 2020-11-27 NOTE — ED Provider Notes (Addendum)
[facility] DEPT Provider Note   CSN: [id number] Arrival date & time: 11/27/20  4403     History Chief Complaint  Patient presents with   Abdominal Pain    Andre Glass is a 70 y.o. male.   Abdominal Pain Associated symptoms: constipation and diarrhea   Associated symptoms: no chest pain, no chills, no cough, no dysuria, no fever, no hematuria, no shortness of breath, no sore throat and no vomiting    70 year old male with a history of metastatic prostate cancer, obstructive uropathy requiring bilateral nephrostomy tube placement, presenting to the emergency department with worsening right-sided abdominal pain.  The patient states that he woke this morning with sudden onset sharp and severe abdominal pain with no radiation, 8/10 in severity. He states he is passing gas and has had a few loose non-bloody bowel movements in the past day.   He was recently seen in the emergency department on 11/20/2020 during which time he had a CT abdomen pelvis showing a possible malignant stricture, progressive intra-abdominal metastatic disease, extensive osseous metastatic disease as well as evidence of bilateral lower extremity DVT with pelvic veins being compressed by pelvic adenopathy.  A DVT ultrasound confirmed a right peroneal DVT and acute left common femoral DVT extending throughout the veins of the left lower leg.  Vascular surgery was consulted and did not recommend immediate intervention.  Recommended anticoagulation & outpatient follow-up.  The patient was started on Xarelto.  Past Medical History:  Diagnosis Date   Anemia    History of kidney stones    Prostate cancer ([place])    UTI (urinary tract infection)     Patient Active Problem List   Diagnosis Date Noted   Fecal impaction ([place]) 11/27/2020   Hypokalemia 11/27/2020   Hypomagnesemia 11/27/2020   DVT of deep femoral vein, left ([place]) 11/27/2020   Abdominal pain    Severe protein-calorie malnutrition  ([place]) 10/21/2020   Acute cystitis without hematuria    Anemia    Bacteremia due to Klebsiella pneumoniae    Altered mental status 10/18/2020   Myoclonus    Symptomatic anemia 10/17/2020   AKI (acute kidney injury) ([place]) 09/28/2020   Hydronephrosis 09/28/2020   Obstructive uropathy 09/28/2020   Intractable pain 01/18/2020   BPH (benign prostatic hyperplasia) 11/08/2019   Catheter-associated urinary tract infection ([place]) 09/14/2019   Drug induced constipation 07/18/2019   Goals of care, counseling/discussion 06/26/2019   Metastatic castration-resistant adenocarcinoma of prostate ([place]) 04/19/2019    Past Surgical History:  Procedure Laterality Date   IR IMAGING GUIDED PORT INSERTION  01/31/2020   IR NEPHROSTOGRAM LEFT THRU EXISTING ACCESS  11/20/2020   IR NEPHROSTOGRAM RIGHT THRU EXISTING ACCESS  11/20/2020   IR NEPHROSTOMY EXCHANGE RIGHT  10/25/2020   IR NEPHROSTOMY PLACEMENT LEFT  09/29/2020   IR NEPHROSTOMY PLACEMENT LEFT  11/02/2020   IR NEPHROSTOMY PLACEMENT RIGHT  09/29/2020   IR NEPHROSTOMY PLACEMENT RIGHT  11/02/2020   IR URETERAL STENT PERC EXCHG MOD SED  11/19/2020   IR URETERAL STENT PERC EXCHG MOD SED  11/19/2020   TRANSURETHRAL RESECTION OF PROSTATE N/A 11/08/2019   Procedure: TRANSURETHRAL RESECTION OF THE PROSTATE (TURP);  Surgeon: [name], MD;  Location: [facility];  Service: Urology;  Laterality: N/A;       Family History  Problem Relation Age of Onset   Stomach cancer Mother    Pancreatic cancer Sister    Breast cancer Neg Hx    Prostate cancer Neg Hx  Colon cancer Neg Hx     Social History   Tobacco Use   Smoking status: Never   Smokeless tobacco: Never  Vaping Use   Vaping Use: Never used  Substance Use Topics   Alcohol use: Never   Drug use: Never    Home Medications Prior to Admission medications   Medication Sig Start Date End Date Taking? Authorizing Provider  acetaminophen (TYLENOL) 325 MG tablet Take 2 tablets (650 mg total) by mouth  every 6 (six) hours as needed for headache. 10/30/20  Yes Ezequiel Essex, MD  amLODipine (NORVASC) 10 MG tablet Take 1 tablet (10 mg total) by mouth daily. 10/05/20  Yes Rai, Ripudeep K, MD  cephALEXin (KEFLEX) 500 MG capsule Take 1 capsule (500 mg total) by mouth 3 (three) times daily for 7 days. 11/20/20 11/27/20 Yes Gareth Morgan, MD  feeding supplement (ENSURE ENLIVE / ENSURE PLUS) LIQD Take 237 mLs by mouth 3 (three) times daily between meals. 10/30/20  Yes Ezequiel Essex, MD  HYDROmorphone (DILAUDID) 4 MG tablet Take 1.5 tablets (6 mg total) by mouth every 4 (four) hours. Patient taking differently: Take 4-6 mg by mouth every 4 (four) hours as needed for moderate pain. 10/30/20 11/29/20 Yes Ezequiel Essex, MD  Iron-Vitamins (GERITOL COMPLETE) TABS Take 1 tablet by mouth daily.   Yes [provider]  lidocaine-prilocaine (EMLA) cream Apply 1 application topically as needed. 01/15/20  Yes [name], MD  mirtazapine (REMERON) 7.5 MG tablet TAKE 1 TABLET BY MOUTH AT BEDTIME. 11/21/20  Yes Gifford Shave, MD  Multiple Vitamin (MULTIVITAMIN WITH MINERALS) TABS tablet Take 1 tablet by mouth daily.   Yes [provider]  naloxegol oxalate (MOVANTIK) 12.5 MG TABS tablet Take 1 tablet (12.5 mg total) by mouth daily. Take it in the morning 1 hour before breakfast daily for constipation. 10/05/20  Yes Rai, Ripudeep K, MD  ondansetron (ZOFRAN) 4 MG tablet Take 1 tablet (4 mg total) by mouth 3 (three) times daily as needed for nausea or vomiting. 09/24/20  Yes Shadad, Mathis Dad, MD  Plecanatide (TRULANCE) 3 MG TABS Take 3 mg by mouth daily. 08/08/20  Yes [name], [name], DO  polyethylene glycol (MIRALAX) 17 g packet Take 17 g by mouth 2 (two) times daily. Patient taking differently: Take 17 g by mouth daily as needed for mild constipation. 11/06/20  Yes Alen Bleacher, MD  predniSONE (DELTASONE) 5 MG tablet TAKE 1 TABLET BY MOUTH EVERY DAY WITH BREAKFAST Patient taking differently: Take 5 mg by  mouth daily with breakfast. 10/18/20  Yes Shadad, Mathis Dad, MD  pregabalin (LYRICA) 50 MG capsule Take 1 capsule (50 mg total) by mouth 2 (two) times daily. 10/30/20 12/29/20 Yes Ezequiel Essex, MD  prochlorperazine (COMPAZINE) 10 MG tablet Take 1 tablet (10 mg total) by mouth every 6 (six) hours as needed for nausea or vomiting. 09/24/20  Yes Shadad, Mathis Dad, MD  RIVAROXABAN [employer] Reasons) VTE STARTER PACK (15 & 20 MG) Follow package directions: Take one 15mg  tablet by mouth twice a day. On day 22, switch to one 20mg  tablet once a day. Take with food. Patient taking differently: Take 15-20 mg by mouth See admin instructions. Follow package directions: Take one 15mg  tablet by mouth twice a day. On day 22, switch to one 20mg  tablet once a day. Take with food. 11/20/20  Yes Lorin Glass, PA-C  tamsulosin (FLOMAX) 0.4 MG CAPS capsule TAKE 1 CAPSULE (0.4 MG TOTAL) BY MOUTH DAILY AS NEEDED (TO HELP WITH URINATION). 11/21/20  Yes  Gifford Shave, MD  vitamin C (ASCORBIC ACID) 500 MG tablet Take 1,000 mg by mouth daily.   Yes [provider]  linaclotide (LINZESS) 290 MCG CAPS capsule Take 1 capsule (290 mcg total) by mouth daily as needed (constipation, take before breakfast). Patient not taking: No sig reported 10/05/20   Mendel Corning, MD    Allergies    Ibuprofen and Shrimp [shellfish allergy]  Review of Systems   Review of Systems  Constitutional:  Negative for chills and fever.  HENT:  Negative for ear pain and sore throat.   Eyes:  Negative for pain and visual disturbance.  Respiratory:  Negative for cough and shortness of breath.   Cardiovascular:  Negative for chest pain and palpitations.  Gastrointestinal:  Positive for abdominal distention, abdominal pain, constipation and diarrhea. Negative for anal bleeding, blood in stool and vomiting.  Genitourinary:  Negative for dysuria and hematuria.  Musculoskeletal:  Negative for arthralgias and back pain.  Skin:  Negative for color change  and rash.  Neurological:  Negative for seizures and syncope.  All other systems reviewed and are negative.  Physical Exam Updated Vital Signs BP 129/69   Pulse 81   Temp 97.8 F (36.6 C) (Oral)   Resp 19   SpO2 100%   Physical Exam Vitals and nursing note reviewed.  Constitutional:      Appearance: He is well-developed.  HENT:     Head: Normocephalic and atraumatic.  Eyes:     Conjunctiva/sclera: Conjunctivae normal.  Cardiovascular:     Rate and Rhythm: Normal rate and regular rhythm.     Heart sounds: No murmur heard. Pulmonary:     Effort: Pulmonary effort is normal. No respiratory distress.     Breath sounds: Normal breath sounds.  Abdominal:     Palpations: Abdomen is soft.     Tenderness: There is abdominal tenderness in the right upper quadrant and right lower quadrant. There is guarding. There is no right CVA tenderness or left CVA tenderness.  Musculoskeletal:     Cervical back: Neck supple.  Skin:    General: Skin is warm and dry.  Neurological:     General: No focal deficit present.     Mental Status: He is alert and oriented to person, place, and time.    ED Results / Procedures / Treatments   Labs (all labs ordered are listed, but only abnormal results are displayed) Labs Reviewed  COMPREHENSIVE METABOLIC PANEL - Abnormal; Notable for the following components:      Result Value   Potassium 3.0 (*)    Chloride 112 (*)    Glucose, Bld 111 (*)    Calcium 8.8 (*)    Albumin 2.3 (*)    All other components within normal limits  CBC WITH DIFFERENTIAL/PLATELET - Abnormal; Notable for the following components:   WBC 24.4 (*)    RBC 2.28 (*)    Hemoglobin 6.6 (*)    HCT 21.9 (*)    RDW 17.2 (*)    nRBC 0.9 (*)    Neutro Abs 21.7 (*)    Abs Immature Granulocytes 0.70 (*)    All other components within normal limits  URINALYSIS, COMPLETE (UACMP) WITH MICROSCOPIC - Abnormal; Notable for the following components:   Color, Urine RED (*)    APPearance  CLOUDY (*)    Glucose, UA   (*)    Value: TEST NOT REPORTED DUE TO COLOR INTERFERENCE OF URINE PIGMENT   Hgb urine dipstick   (*)  Value: TEST NOT REPORTED DUE TO COLOR INTERFERENCE OF URINE PIGMENT   Bilirubin Urine   (*)    Value: TEST NOT REPORTED DUE TO COLOR INTERFERENCE OF URINE PIGMENT   Ketones, ur   (*)    Value: TEST NOT REPORTED DUE TO COLOR INTERFERENCE OF URINE PIGMENT   Protein, ur   (*)    Value: TEST NOT REPORTED DUE TO COLOR INTERFERENCE OF URINE PIGMENT   Nitrite   (*)    Value: TEST NOT REPORTED DUE TO COLOR INTERFERENCE OF URINE PIGMENT   Leukocytes,Ua   (*)    Value: TEST NOT REPORTED DUE TO COLOR INTERFERENCE OF URINE PIGMENT   RBC / HPF >50 (*)    All other components within normal limits  URINALYSIS, ROUTINE W REFLEX MICROSCOPIC - Abnormal; Notable for the following components:   APPearance CLOUDY (*)    Hgb urine dipstick LARGE (*)    Protein, ur 100 (*)    RBC / HPF >50 (*)    All other components within normal limits  MAGNESIUM - Abnormal; Notable for the following components:   Magnesium 1.5 (*)    All other components within normal limits  RESP PANEL BY RT-PCR (FLU A&B, COVID) [id number]  URINE CULTURE  URINE CULTURE  LIPASE, BLOOD  LACTIC ACID, PLASMA  APTT  CBC WITH DIFFERENTIAL/PLATELET  PROTIME-INR  APTT  COMPREHENSIVE METABOLIC PANEL  CBC  MAGNESIUM  HEPARIN LEVEL (UNFRACTIONATED)  PREPARE RBC (CROSSMATCH)  TYPE AND SCREEN    EKG EKG Interpretation  Date/Time:  Wednesday November 27 2020 10:44:31 EDT Ventricular Rate:  81 PR Interval:  171 QRS Duration: 79 QT Interval:  343 QTC Calculation: 399 R Axis:   -35 Text Interpretation: Sinus rhythm Inferior infarct, old Anteroseptal infarct, old No significant change since last tracing Confirmed by Regan Lemming ([id number]) on 11/27/2020 12:00:56 PM  Radiology CT ABDOMEN PELVIS W CONTRAST  Result Date: 11/27/2020 CLINICAL DATA:  Bowel obstruction suspected, history of prostate cancer, ongoing  chemotherapy EXAM: CT ABDOMEN AND PELVIS WITH CONTRAST TECHNIQUE: Multidetector CT imaging of the abdomen and pelvis was performed using the standard protocol following bolus administration of intravenous contrast. CONTRAST:  3mL OMNIPAQUE IOHEXOL 350 MG/ML SOLN, additional oral enteric contrast COMPARISON:  CT abdomen pelvis, 11/20/2020 FINDINGS: Lower chest: Trace bilateral pleural effusions and associated atelectasis or consolidation. Hepatobiliary: No solid liver abnormality is seen. No gallstones, gallbladder wall thickening, or biliary dilatation. Pancreas: Unremarkable. No pancreatic ductal dilatation or surrounding inflammatory changes. Spleen: Normal in size without significant abnormality. Adrenals/Urinary Tract: Adrenal glands are unremarkable. Bilateral percutaneous nephrostomy tubes with formed pigtails in the renal pelves. Additional bilateral double-J ureteral stent catheters with formed pigtails in the renal pelves and urinary bladder. The urinary bladder is again severely thickened. Stomach/Bowel: Stomach is within normal limits. Appendix appears normal. There is again noted a large burden of stool throughout the colon and rectum, with very large stool balls in the distended sigmoid colon. The caliber of the colon at this level is 9.9 cm, previously no greater than 6.5 cm (series 4, image 57). Vascular/Lymphatic: Aortic atherosclerosis. Multiple enlarged retroperitoneal, bilateral iliac, and pelvic sidewall lymph nodes are unchanged from recent CT. Reproductive: Gross prostatomegaly. Other: Mild anasarca. Trace ascites. Right eccentric anterior pelvic mass is unchanged compared to recent prior CT (series 2, image 88). Musculoskeletal: Multiple sclerotic osseous metastases, unchanged. IMPRESSION: 1. There is again noted a large burden of stool throughout the colon and rectum, with very large stool balls in the distended sigmoid colon.  The caliber of the colon at this level is 9.9 cm, previously no  greater than 6.5 cm. Findings are concerning for fecal impaction, with abnormal transit due to adjacent prostate malignancy. 2. Bilateral percutaneous nephrostomy tubes with formed pigtails in the renal pelves and urinary bladder. Additional bilateral double-J ureteral stent catheters with formed pigtails in the renal pelves and urinary bladder. No hydronephrosis. 3. Findings of advanced metastatic prostate malignancy not significantly changed compared to recent CT. 4. The urinary bladder is again severely thickened, likely related to chronic outlet obstruction, possibly with superimposed infectious or inflammatory cystitis. 5. Trace bilateral pleural effusions and associated atelectasis or consolidation. 6. Trace ascites and anasarca. Aortic Atherosclerosis (ICD10-I70.0). Electronically Signed   By: Eddie Candle M.D.   On: 11/27/2020 13:58    Procedures .Critical Care  Date/Time: 11/27/2020 7:11 PM Performed by: Regan Lemming, MD Authorized by: Regan Lemming, MD   Critical care provider statement:    Critical care time (minutes):  36   Critical care was necessary to treat or prevent imminent or life-threatening deterioration of the following conditions: Anemia requiring blood transfusion.   Critical care was time spent personally by me on the following activities:  Discussions with consultants, evaluation of patient's response to treatment, examination of patient, ordering and performing treatments and interventions, ordering and review of laboratory studies, ordering and review of radiographic studies, pulse oximetry, re-evaluation of patient's condition, obtaining history from patient or surrogate and review of old charts   Medications Ordered in ED Medications  sodium chloride 0.9 % bolus 1,000 mL (0 mLs Intravenous Stopped 11/27/20 1233)    And  0.9 %  sodium chloride infusion ( Intravenous New Bag/Given 11/27/20 1238)  0.9 %  sodium chloride infusion (has no administration in time range)   linaclotide (LINZESS) capsule 290 mcg (has no administration in time range)  acetaminophen (TYLENOL) tablet 650 mg (has no administration in time range)  cephALEXin (KEFLEX) capsule 500 mg (has no administration in time range)  mirtazapine (REMERON) tablet 7.5 mg (has no administration in time range)  prochlorperazine (COMPAZINE) tablet 10 mg (has no administration in time range)  predniSONE (DELTASONE) tablet 5 mg (has no administration in time range)  naloxegol oxalate (MOVANTIK) tablet 12.5 mg (has no administration in time range)  Plecanatide TABS 3 mg (has no administration in time range)  tamsulosin (FLOMAX) capsule 0.4 mg (has no administration in time range)  feeding supplement (ENSURE ENLIVE / ENSURE PLUS) liquid 237 mL (has no administration in time range)  pregabalin (LYRICA) capsule 50 mg (has no administration in time range)  multivitamin with minerals tablet 1 tablet (has no administration in time range)  ascorbic acid (VITAMIN C) tablet 1,000 mg (has no administration in time range)  HYDROcodone-acetaminophen (NORCO/VICODIN) 5-325 MG per tablet 1-2 tablet (has no administration in time range)  docusate sodium (COLACE) capsule 100 mg (has no administration in time range)  senna (SENOKOT) tablet 8.6 mg (has no administration in time range)  ondansetron (ZOFRAN) tablet 4 mg (has no administration in time range)    Or  ondansetron (ZOFRAN) injection 4 mg (has no administration in time range)  HYDROmorphone (DILAUDID) injection 0.5-1 mg (has no administration in time range)  dextrose 5 % and 0.9 % NaCl with KCl 40 mEq/L infusion (has no administration in time range)  heparin ADULT infusion 100 units/mL (25000 units/227mL) (1,200 Units/hr Intravenous New Bag/Given 11/27/20 1823)  morphine 4 MG/ML injection 4 mg (4 mg Intravenous Given 11/27/20 1115)  ondansetron (ZOFRAN) injection 4  mg (4 mg Intravenous Given 11/27/20 1115)  iohexol (OMNIPAQUE) 350 MG/ML injection 80 mL (80 mLs  Intravenous Contrast Given 11/27/20 1250)  magnesium oxide (MAG-OX) tablet 400 mg (400 mg Oral Given 11/27/20 1330)  potassium chloride SA (KLOR-CON) CR tablet 40 mEq (40 mEq Oral Given 11/27/20 1330)  magnesium sulfate IVPB 2 g 50 mL (0 g Intravenous Stopped 11/27/20 1654)  sodium phosphate (FLEET) 7-19 GM/118ML enema 1 enema (1 enema Rectal Given 11/27/20 1433)  lactulose (CHRONULAC) enema 200 gm (300 mLs Rectal Given 11/27/20 1856)    ED Course  I have reviewed the triage vital signs and the nursing notes.  Pertinent labs & imaging results that were available during my care of the patient were reviewed by me and considered in my medical decision making (see chart for details).    MDM Rules/Calculators/A&P                           70 year old male with a history of metastatic prostate cancer, obstructive uropathy requiring bilateral nephrostomy tube placement, presenting to the emergency department with worsening right-sided abdominal pain.  The patient states that he woke this morning with sudden onset sharp and severe abdominal pain with no radiation, 8/10 in severity. He states he is passing gas and has had a few loose non-bloody bowel movements in the past day.   On arrival, the patient was afebrile, hemodynamically stable, mildly hypertensive BP 152/74, not tachycardic or tachypneic, saturating well on room air.  The patient had diffuse severe abdominal pain with right-sided tenderness to palpation on exam and guarding present.  Pain has worsened over the past day.  He has passing stool but has been also incontinent of stool.  He had a recent CT abdomen pelvis which revealed possible malignant stricture of the intestines.  Differential diagnosis: Constipation, fecal impaction, stercoral colitis, ileus, small bowel obstruction, worsening metastatic disease, intra-abdominal abscess, diverticulitis, appendicitis.   Will obtain labs, establish IV access, administered IV fluids for volume  resuscitation, administer IV pain control and antiemetics, obtain CT abdomen pelvis with contrast to assess further.  Labs concerning for a leukocytosis to 24 (downtrending from 30, patient had outpatient leukocytosis 2 days ago of unclear etiology), new anemia with worsening hemoglobin to 6.6 from 7.52 days ago, 7.9 7 days ago, 8.313 days ago, 9.23 weeks ago.  Concern for slow GI bleed in the setting of Xarelto use versus anemia of chronic disease in the setting of the patient's known prostate cancer.  Given the patient's acute anemia of unclear etiology, he was consented for and administered 1 unit PRBCs in the emergency department.  Lactic acid resulted normal, a lipase also resulted normal.  COVID-19 and influenza PCR testing resulted negative.  Urinalysis and urine culture obtained from the patient's nephrostomy tubes were collected and pending at time of admission.  Electrolytes significant for hypokalemia to 3.0 and hypomagnesemia.  This was replenished orally and IV.  A CT abdomen pelvis was performed which noted a large burden of stool throughout the colon and rectum with a very large stool balls in the distended sigmoid colon.  The caliber of the colon at this level is 9.9 cm, previously no greater than 6.5 cm.  Findings are concerning for fecal impaction with abnormal transit due to adjacent prostate malignancy.  No hydronephrosis with bilateral percutaneous nephrostomy tubes in place and double-J ureteric stent in place.  Advanced metastatic prostate malignancy unchanged from prior imaging.  Thickening of the urinary  bladder likely related to chronic outlet obstruction with possible superimposed infectious or inflammatory cystitis.  Due to fecal impaction concerns, a Fleet centimeter was attempted in the ED.  The patient was notably incontinent of stool in the emergency department.  He continued to complain of persistent severe abdominal pain in the vicinity of his distended sigmoid colon.   Given his leukocytosis and severe abdominal pain with notably markedly distended colon on CT, concern for possible stercoral colitis.  General surgery was consulted for recommendations, recommendations pending at time of admission.  Hospitalist medicine was consulted for admission for further management.  Plan will be for GI consultation, follow-up GI and surgery recommendations, plan for likely further disimpaction of the patient inpatient.  The patient was subsequently admitted in stable condition.   Final Clinical Impression(s) / ED Diagnoses Final diagnoses:  Right lower quadrant abdominal pain  Leukocytosis, unspecified type  Hypomagnesemia  Hypokalemia  Fecal impaction of colon ([place])  Constipation  Fecal impaction [facility])    Rx / DC Orders ED Discharge Orders     None        Regan Lemming, MD 11/27/20 1910    Regan Lemming, MD 11/27/20 1912

## 2020-11-27 NOTE — ED Notes (Signed)
Bilateral urostomy leg bags emptied- 1000 mls dark urine noted

## 2020-11-27 NOTE — Progress Notes (Signed)
ANTICOAGULATION CONSULT NOTE   Pharmacy Consult for heparin Indication: recent bilateral DVT on 8/17 (home xarelto on hold)  Allergies  Allergen Reactions   Ibuprofen Anaphylaxis   Shrimp [Shellfish Allergy] Anaphylaxis    Patient Measurements: wt 83kg, height 68 inches   Heparin Dosing Weight: 83 kg  Vital Signs: Temp: 97.8 F (36.6 C) (08/24 1010) Temp Source: Oral (08/24 1010) BP: 131/60 (08/24 1545) Pulse Rate: 79 (08/24 1545)  Labs: Recent Labs    11/25/20 1554 11/27/20 1035  HGB 7.5* 6.6*  HCT 23.9* 21.9*  PLT 443 296  CREATININE 1.11 0.87    Estimated Creatinine Clearance: 83.1 mL/min (by C-G formula based on SCr of 0.87 mg/dL).   Medications:  - on xarelto dose pack PTA (on 15mg  bid - last dose taken on 11/26/20 at [facility])  Assessment: Patient is a 70 y.o M with metastatic prostate cancer currently undergoing chemotherapy treatment and on xarelto PTA for bilateral DVTs that was diagnosed on 11/20/20, presented to the ED on 11/27/20 with c/o abdominal pain.  Pharmacy has been consulted to transition patient to heparin drip in case invasive intervention is needed with this admission.  Today, 11/27/2020: - hgb 6.6, plts 296K (1 unit PRBC ordered for today) - baseline aPTT 34 sec, heparin level is elevated at 0.91--> is elevated d/t effect of xarelto. Will adjust heparin dose using aPTT at this time. Once aPTT and heparin levels correlate, then will monitor with heparin level alone - no bleeding documented - scr 0.87  Goal of Therapy:  Heparin level 0.3-0.7 units/ml aPTT 66-102 seconds Monitor platelets by anticoagulation protocol: Yes   Plan:  - heparin drip at 1200 units/hr - check 6  hr aPTT and heparin level - monitor for s/sx bleeding   Royal V[place] P 11/27/2020,4:57 PM

## 2020-11-27 NOTE — ED Notes (Signed)
Attempted to give tap water enema- patient has poor rectal muscle tone and the enema runs straight out of the patient.  Brown colored water noted

## 2020-11-27 NOTE — ED Notes (Signed)
Called lab to check on status of blood unit. Unit not ready yet.

## 2020-11-27 NOTE — Consult Note (Signed)
Consult Note  Andre Glass Jul 27, 1950  [id number].    Requesting MD: Dr. Armandina Gemma Chief Complaint/Reason for Consult: abdominal pain, severe constipation  HPI:  70 yo make with medical history significant for metastatic prostate cancer, obstructive uropathy requiring bilateral nephrostomy tube placement, who presented to the emergency department due to worsening right-sided abdominal pain. Pain began suddenly this am and he describes it as sharp and severe.  Work up in the ED significant for CT abd/pelvis showing large burden of stool throughout the colon and rectum, with very large stool balls in the distended sigmoid colon. Findings are concerning for fecal impaction. Leukocytosis of 24.4 (improved from outpatient 30.0), hgb 6.6.   We were asked to see.   Patient states he last had a "normal" bowel movement over 1 month ago. He was on stool softeners and laxatives but stopped taking them. Linzess is also on his med list on review. He takes dilaudid for pain at home but states he usually only takes it at night. He denies nausea or emesis at this time. ROS otherwise as below.  Substance use: none Allergies: ibuprofen, shellfish Blood thinners: xarelto Past Surgeries: no prior abdominal surgeries  He lives with his niece and nephew and ambulates with walker/cane at baseline   ROS: Review of Systems  Constitutional:  Negative for chills and fever.  Respiratory:  Negative for cough and shortness of breath.   Cardiovascular:  Negative for chest pain and palpitations.  Gastrointestinal:  Positive for abdominal pain and constipation. Negative for nausea and vomiting.   Family History  Problem Relation Age of Onset   Stomach cancer Mother    Pancreatic cancer Sister    Breast cancer Neg Hx    Prostate cancer Neg Hx    Colon cancer Neg Hx     Past Medical History:  Diagnosis Date   Anemia    History of kidney stones    Prostate cancer ([place])    UTI (urinary tract  infection)     Past Surgical History:  Procedure Laterality Date   IR IMAGING GUIDED PORT INSERTION  01/31/2020   IR NEPHROSTOGRAM LEFT THRU EXISTING ACCESS  11/20/2020   IR NEPHROSTOGRAM RIGHT THRU EXISTING ACCESS  11/20/2020   IR NEPHROSTOMY EXCHANGE RIGHT  10/25/2020   IR NEPHROSTOMY PLACEMENT LEFT  09/29/2020   IR NEPHROSTOMY PLACEMENT LEFT  11/02/2020   IR NEPHROSTOMY PLACEMENT RIGHT  09/29/2020   IR NEPHROSTOMY PLACEMENT RIGHT  11/02/2020   IR URETERAL STENT PERC EXCHG MOD SED  11/19/2020   IR URETERAL STENT PERC EXCHG MOD SED  11/19/2020   TRANSURETHRAL RESECTION OF PROSTATE N/A 11/08/2019   Procedure: TRANSURETHRAL RESECTION OF THE PROSTATE (TURP);  Surgeon: [name], MD;  Location: [facility];  Service: Urology;  Laterality: N/A;    Social History:  reports that he has never smoked. He has never used smokeless tobacco. He reports that he does not drink alcohol and does not use drugs.  Allergies:  Allergies  Allergen Reactions   Ibuprofen Anaphylaxis   Shrimp [Shellfish Allergy] Anaphylaxis    (Not in a hospital admission)   Blood pressure 131/60, pulse 79, temperature 97.8 F (36.6 C), temperature source Oral, resp. rate 16, SpO2 98 %. Physical Exam:  General: pleasant, WD, male who is laying in bed uncomfortable appearing HEENT: head is normocephalic, atraumatic.  Sclera are noninjected.  PERRL.  Ears and nose without any masses or lesions.  Mouth is pink and moist Heart: regular, rate, and  rhythm.  Normal s1,s2. No obvious murmurs, gallops, or rubs noted.  Palpable radial and pedal pulses bilaterally Lungs: CTAB, no wheezes, rhonchi, or rales noted.  Respiratory effort nonlabored Abd: no masses, hernias, or organomegaly. Distended and diffusely TTP but greatest over RLQ. No rebound or guarding or concern for peritonitis at this time. +BS MS: all 4 extremities are symmetrical with no cyanosis, clubbing, or edema. Skin: warm and dry with no masses, lesions, or  rashes Neuro: Cranial nerves 2-12 grossly intact, sensation is normal throughout Psych: A&Ox3 with an appropriate affect.   Results for orders placed or performed during the hospital encounter of 11/27/20 (from the past 48 hour(s))  Comprehensive metabolic panel     Status: Abnormal   Collection Time: 11/27/20 10:35 AM  Result Value Ref Range   Sodium 144 135 - 145 mmol/L   Potassium 3.0 (L) 3.5 - 5.1 mmol/L   Chloride 112 (H) 98 - 111 mmol/L   CO2 25 22 - 32 mmol/L   Glucose, Bld 111 (H) 70 - 99 mg/dL    Comment: Glucose reference range applies only to samples taken after fasting for at least 8 hours.   BUN 19 8 - 23 mg/dL   Creatinine, Ser 0.87 0.61 - 1.24 mg/dL   Calcium 8.8 (L) 8.9 - 10.3 mg/dL   Total Protein 6.6 6.5 - 8.1 g/dL   Albumin 2.3 (L) 3.5 - 5.0 g/dL   AST 33 15 - 41 U/L   ALT 21 0 - 44 U/L   Alkaline Phosphatase 113 38 - 126 U/L   Total Bilirubin 0.4 0.3 - 1.2 mg/dL   GFR, Estimated >60 >60 mL/min    Comment: (NOTE) Calculated using the CKD-EPI Creatinine Equation (2021)    Anion gap 7 5 - 15    Comment: Performed at [facility], [place] [address], [place], [place] [address]  Lipase, blood     Status: None   Collection Time: 11/27/20 10:35 AM  Result Value Ref Range   Lipase 22 11 - 51 U/L    Comment: Performed at [facility], [place] [address], [place], [place] [address]  CBC with Diff     Status: Abnormal   Collection Time: 11/27/20 10:35 AM  Result Value Ref Range   WBC 24.4 (H) 4.0 - 10.5 K/uL   RBC 2.28 (L) 4.22 - 5.81 MIL/uL   Hemoglobin 6.6 (LL) 13.0 - 17.0 g/dL    Comment: REPEATED TO VERIFY THIS CRITICAL RESULT HAS VERIFIED AND BEEN CALLED TO [facility]. RN BY NICOLE MCCOY ON 08 24 2022 AT 1150, AND HAS BEEN READ BACK. CRITICAL RESULT VERIFIED    HCT 21.9 (L) 39.0 - 52.0 %   MCV 96.1 80.0 - 100.0 fL   MCH 28.9 26.0 - 34.0 pg   MCHC 30.1 30.0 - 36.0 g/dL   RDW 17.2 (H) 11.5 - 15.5 %   Platelets 296 150 - 400 K/uL    nRBC 0.9 (H) 0.0 - 0.2 %   Neutrophils Relative % 84 %   Neutro Abs 21.7 (H) 1.7 - 7.7 K/uL   Band Neutrophils 5 %   Lymphocytes Relative 5 %   Lymphs Abs 1.2 0.7 - 4.0 K/uL   Monocytes Relative 3 %   Monocytes Absolute 0.7 0.1 - 1.0 K/uL   Eosinophils Relative 0 %   Eosinophils Absolute 0.0 0.0 - 0.5 K/uL   Basophils Relative 0 %   Basophils Absolute 0.0 0.0 - 0.1 K/uL   WBC Morphology  MILD LEFT SHIFT (1-5% METAS, OCC MYELO, OCC BANDS)    Myelocytes 3 %   Abs Immature Granulocytes 0.70 (H) 0.00 - 0.07 K/uL   Polychromasia PRESENT     Comment: Performed at [facility], [place] [address] [place], [place] [address]  Lactic acid, plasma     Status: None   Collection Time: 11/27/20 10:35 AM  Result Value Ref Range   Lactic Acid, Venous 0.8 0.5 - 1.9 mmol/L    Comment: Performed at [facility], [place] [address] [place], [place] [address]  Magnesium     Status: Abnormal   Collection Time: 11/27/20 10:35 AM  Result Value Ref Range   Magnesium 1.5 (L) 1.7 - 2.4 mg/dL    Comment: Performed at [facility], [place] [address] [place], [place] [address]  Resp Panel by RT-PCR (Flu A&B, Covid) Nasopharyngeal Swab     Status: None   Collection Time: 11/27/20  1:51 PM   Specimen: Nasopharyngeal Swab; Nasopharyngeal(NP) swabs in vial transport medium  Result Value Ref Range   SARS Coronavirus 2 by RT PCR NEGATIVE NEGATIVE    Comment: (NOTE) SARS-CoV-2 target nucleic acids are NOT DETECTED.  The SARS-CoV-2 RNA is generally detectable in upper respiratory specimens during the acute phase of infection. The lowest concentration of SARS-CoV-2 viral copies this assay can detect is 138 copies/mL. A negative result does not preclude SARS-Cov-2 infection and should not be used as the sole basis for treatment or other patient management decisions. A negative result may occur with  improper specimen collection/handling, submission of specimen  other than nasopharyngeal swab, presence of viral mutation(s) within the areas targeted by this assay, and inadequate number of viral copies(<138 copies/mL). A negative result must be combined with clinical observations, patient history, and epidemiological information. The expected result is Negative.  Fact Sheet for Patients:  EntrepreneurPulse.com.au  Fact Sheet for Healthcare Providers:  IncredibleEmployment.be  This test is no t yet approved or cleared by the [place] FDA and  has been authorized for detection and/or diagnosis of SARS-CoV-2 by FDA under an Emergency Use Authorization (EUA). This EUA will remain  in effect (meaning this test can be used) for the duration of the COVID-19 declaration under Section 564(b)(1) of the Act, 21 U.S.C.section 360bbb-3(b)(1), unless the authorization is terminated  or revoked sooner.       Influenza A by PCR NEGATIVE NEGATIVE   Influenza B by PCR NEGATIVE NEGATIVE    Comment: (NOTE) The Xpert Xpress SARS-CoV-2/FLU/RSV plus assay is intended as an aid in the diagnosis of influenza from Nasopharyngeal swab specimens and should not be used as a sole basis for treatment. Nasal washings and aspirates are unacceptable for Xpert Xpress SARS-CoV-2/FLU/RSV testing.  Fact Sheet for Patients: EntrepreneurPulse.com.au  Fact Sheet for Healthcare Providers: IncredibleEmployment.be  This test is not yet approved or cleared by the [place] FDA and has been authorized for detection and/or diagnosis of SARS-CoV-2 by FDA under an Emergency Use Authorization (EUA). This EUA will remain in effect (meaning this test can be used) for the duration of the COVID-19 declaration under Section 564(b)(1) of the Act, 21 U.S.C. section 360bbb-3(b)(1), unless the authorization is terminated or revoked.  Performed at [facility], [place] [address], [place], [place] [address]    CT ABDOMEN PELVIS W CONTRAST  Result Date: 11/27/2020 CLINICAL DATA:  Bowel obstruction suspected, history of prostate cancer, ongoing chemotherapy EXAM: CT ABDOMEN AND PELVIS WITH CONTRAST TECHNIQUE: Multidetector CT imaging  of the abdomen and pelvis was performed using the standard protocol following bolus administration of intravenous contrast. CONTRAST:  75mL OMNIPAQUE IOHEXOL 350 MG/ML SOLN, additional oral enteric contrast COMPARISON:  CT abdomen pelvis, 11/20/2020 FINDINGS: Lower chest: Trace bilateral pleural effusions and associated atelectasis or consolidation. Hepatobiliary: No solid liver abnormality is seen. No gallstones, gallbladder wall thickening, or biliary dilatation. Pancreas: Unremarkable. No pancreatic ductal dilatation or surrounding inflammatory changes. Spleen: Normal in size without significant abnormality. Adrenals/Urinary Tract: Adrenal glands are unremarkable. Bilateral percutaneous nephrostomy tubes with formed pigtails in the renal pelves. Additional bilateral double-J ureteral stent catheters with formed pigtails in the renal pelves and urinary bladder. The urinary bladder is again severely thickened. Stomach/Bowel: Stomach is within normal limits. Appendix appears normal. There is again noted a large burden of stool throughout the colon and rectum, with very large stool balls in the distended sigmoid colon. The caliber of the colon at this level is 9.9 cm, previously no greater than 6.5 cm (series 4, image 57). Vascular/Lymphatic: Aortic atherosclerosis. Multiple enlarged retroperitoneal, bilateral iliac, and pelvic sidewall lymph nodes are unchanged from recent CT. Reproductive: Gross prostatomegaly. Other: Mild anasarca. Trace ascites. Right eccentric anterior pelvic mass is unchanged compared to recent prior CT (series 2, image 88). Musculoskeletal: Multiple sclerotic osseous metastases, unchanged. IMPRESSION: 1. There is again noted a large  burden of stool throughout the colon and rectum, with very large stool balls in the distended sigmoid colon. The caliber of the colon at this level is 9.9 cm, previously no greater than 6.5 cm. Findings are concerning for fecal impaction, with abnormal transit due to adjacent prostate malignancy. 2. Bilateral percutaneous nephrostomy tubes with formed pigtails in the renal pelves and urinary bladder. Additional bilateral double-J ureteral stent catheters with formed pigtails in the renal pelves and urinary bladder. No hydronephrosis. 3. Findings of advanced metastatic prostate malignancy not significantly changed compared to recent CT. 4. The urinary bladder is again severely thickened, likely related to chronic outlet obstruction, possibly with superimposed infectious or inflammatory cystitis. 5. Trace bilateral pleural effusions and associated atelectasis or consolidation. 6. Trace ascites and anasarca. Aortic Atherosclerosis (ICD10-I70.0). Electronically Signed   By: Eddie Candle M.D.   On: 11/27/2020 13:58      Assessment/Plan Fecal impaction In setting of metastatic prostate cancer - recommend bowel regimen to include daily SMOG enema. GI consult could be helpful - monitor for signs/symptoms of perforation on exam. Will get am xray - recommend holding xarelto at this time in case emergent surgery is needed. Will order heparin gtt per pharmacy - clear diet okay for now. Would not advance beyond this  Had discussion with patient in regard to indications of emergent surgery and what that would entail (ie likely diversion and possible protracted recovery) at this time he would like to proceed with emergent surgery if indicated. He has a POA as well  Admit to hospitalist team  FEN: clears ID: [id number] VTE: heparin drip  Metastatic prostate cancer anemia DVT, b/l LE  Winferd Humphrey, [facility] 11/27/2020, 4:15 PM Please see Amion for pager number during day hours  7:00am-4:30pm

## 2020-11-27 NOTE — H&P (Signed)
History and Physical    Andre Glass YKD:[id number] DOB: 01/29/1951 DOA: 11/27/2020  PCP: [name], DO  Patient coming from: home  I have personally briefly reviewed patient's old medical records in [place]  Chief Complaint:  abd. pain  HPI: Andre Glass is a 70 y.o. male with medical history significant of metastatic prostate cancer with resultant obstructive uropathy requiring bilateral nephrostomy tube placed presents with worsening right-sided abdominal pain started this morning more so right lower quadrant sharp 10 out of 10.  Patient states he passed gas yesterday and has had several loose bowel movements today to 3 as his estimate.  Nothing makes the pain better or worse.  Patient states he does not have a GI physician.  Patient was recently seen on 11/20/2020 had CT abdomen pelvis showing possible malignant stricture progressive intra-abdominal metastatic disease and extensive osseous metastatic disease as well as evidence of bilateral lower extremity DVT and pelvic veins being compressed by pelvic adenopathy.  Patient had a right peroneal DVT and acute left femoral DVT extending throughout the veins of left lower leg vascular surgery was consulted and time, no recommended intervention per vascular surgery but was started on Xarelto which patient states he has been compliant with.  Pt is on maloxegol and trulance w/o relief.  Patient given fleets enema without relief in the ED.  Patient denies any bleeding per rectum.  Patient's hemoglobin is 6.6 from prior 7.5 from 2 days ago and has a unit of blood ordered in the ED.  Patient states been compliant with his home Xarelto.  Potassium 3 in the ED and magnesium 1.5.  Negative COVID and flu in the ED.  Large amount of blood in UA.  I discussed the patient with Jana Half surgical PA at bedside who recommends bowel regimen and no surgical intervention at this time unless patient does not improve.  Pt s/p 1L ns, 2g mg sulfate iv, mag ox,  morphine and Zofran, 40 p.o. potassium and a fleets enema.  Ct abd. IMPRESSION: 1. There is again noted a large burden of stool throughout the colon and rectum, with very large stool balls in the distended sigmoid colon. The caliber of the colon at this level is 9.9 cm, previously no greater than 6.5 cm. Findings are concerning for fecal impaction, with abnormal transit due to adjacent prostate malignancy. 2. Bilateral percutaneous nephrostomy tubes with formed pigtails in the renal pelves and urinary bladder. Additional bilateral double-J ureteral stent catheters with formed pigtails in the renal pelves and urinary bladder. No hydronephrosis. 3. Findings of advanced metastatic prostate malignancy not significantly changed compared to recent CT. 4. The urinary bladder is again severely thickened, likely related to chronic outlet obstruction, possibly with superimposed infectious or inflammatory cystitis. 5. Trace bilateral pleural effusions and associated atelectasis or consolidation. 6. Trace ascites and anasarca. ED Course: As above EKG sinus rhythm. Review of Systems: As per HPI otherwise all other systems reviewed and are negative.   Past Medical History:  Diagnosis Date   Anemia    History of kidney stones    Prostate cancer ([place])    UTI (urinary tract infection)     Past Surgical History:  Procedure Laterality Date   IR IMAGING GUIDED PORT INSERTION  01/31/2020   IR NEPHROSTOGRAM LEFT THRU EXISTING ACCESS  11/20/2020   IR NEPHROSTOGRAM RIGHT THRU EXISTING ACCESS  11/20/2020   IR NEPHROSTOMY EXCHANGE RIGHT  10/25/2020   IR NEPHROSTOMY PLACEMENT LEFT  09/29/2020   IR NEPHROSTOMY PLACEMENT  LEFT  11/02/2020   IR NEPHROSTOMY PLACEMENT RIGHT  09/29/2020   IR NEPHROSTOMY PLACEMENT RIGHT  11/02/2020   IR URETERAL STENT PERC EXCHG MOD SED  11/19/2020   IR URETERAL STENT PERC EXCHG MOD SED  11/19/2020   TRANSURETHRAL RESECTION OF PROSTATE N/A 11/08/2019   Procedure: TRANSURETHRAL  RESECTION OF THE PROSTATE (TURP);  Surgeon: [name], MD;  Location: [facility];  Service: Urology;  Laterality: N/A;    Social History  reports that he has never smoked. He has never used smokeless tobacco. He reports that he does not drink alcohol and does not use drugs.  Allergies  Allergen Reactions   Ibuprofen Anaphylaxis   Shrimp [Shellfish Allergy] Anaphylaxis    Family History  Problem Relation Age of Onset   Stomach cancer Mother    Pancreatic cancer Sister    Breast cancer Neg Hx    Prostate cancer Neg Hx    Colon cancer Neg Hx      Prior to Admission medications   Medication Sig Start Date End Date Taking? Authorizing Provider  acetaminophen (TYLENOL) 325 MG tablet Take 2 tablets (650 mg total) by mouth every 6 (six) hours as needed for headache. 10/30/20  Yes Ezequiel Essex, MD  amLODipine (NORVASC) 10 MG tablet Take 1 tablet (10 mg total) by mouth daily. 10/05/20  Yes Rai, Ripudeep K, MD  cephALEXin (KEFLEX) 500 MG capsule Take 1 capsule (500 mg total) by mouth 3 (three) times daily for 7 days. 11/20/20 11/27/20 Yes Gareth Morgan, MD  feeding supplement (ENSURE ENLIVE / ENSURE PLUS) LIQD Take 237 mLs by mouth 3 (three) times daily between meals. 10/30/20  Yes Ezequiel Essex, MD  HYDROmorphone (DILAUDID) 4 MG tablet Take 1.5 tablets (6 mg total) by mouth every 4 (four) hours. Patient taking differently: Take 4-6 mg by mouth every 4 (four) hours as needed for moderate pain. 10/30/20 11/29/20 Yes Ezequiel Essex, MD  Iron-Vitamins (GERITOL COMPLETE) TABS Take 1 tablet by mouth daily.   Yes [provider]  lidocaine-prilocaine (EMLA) cream Apply 1 application topically as needed. 01/15/20  Yes [name], MD  mirtazapine (REMERON) 7.5 MG tablet TAKE 1 TABLET BY MOUTH AT BEDTIME. 11/21/20  Yes Gifford Shave, MD  Multiple Vitamin (MULTIVITAMIN WITH MINERALS) TABS tablet Take 1 tablet by mouth daily.   Yes [provider]  naloxegol oxalate  (MOVANTIK) 12.5 MG TABS tablet Take 1 tablet (12.5 mg total) by mouth daily. Take it in the morning 1 hour before breakfast daily for constipation. 10/05/20  Yes Rai, Ripudeep K, MD  ondansetron (ZOFRAN) 4 MG tablet Take 1 tablet (4 mg total) by mouth 3 (three) times daily as needed for nausea or vomiting. 09/24/20  Yes Shadad, Mathis Dad, MD  Plecanatide (TRULANCE) 3 MG TABS Take 3 mg by mouth daily. 08/08/20  Yes [name], [name], DO  polyethylene glycol (MIRALAX) 17 g packet Take 17 g by mouth 2 (two) times daily. Patient taking differently: Take 17 g by mouth daily as needed for mild constipation. 11/06/20  Yes Alen Bleacher, MD  predniSONE (DELTASONE) 5 MG tablet TAKE 1 TABLET BY MOUTH EVERY DAY WITH BREAKFAST Patient taking differently: Take 5 mg by mouth daily with breakfast. 10/18/20  Yes Shadad, Mathis Dad, MD  pregabalin (LYRICA) 50 MG capsule Take 1 capsule (50 mg total) by mouth 2 (two) times daily. 10/30/20 12/29/20 Yes Ezequiel Essex, MD  prochlorperazine (COMPAZINE) 10 MG tablet Take 1 tablet (10 mg total) by mouth every 6 (six) hours as  needed for nausea or vomiting. 09/24/20  Yes Shadad, Mathis Dad, MD  RIVAROXABAN Alveda Reasons) VTE STARTER PACK (15 & 20 MG) Follow package directions: Take one 15mg  tablet by mouth twice a day. On day 22, switch to one 20mg  tablet once a day. Take with food. Patient taking differently: Take 15-20 mg by mouth See admin instructions. Follow package directions: Take one 15mg  tablet by mouth twice a day. On day 22, switch to one 20mg  tablet once a day. Take with food. 11/20/20  Yes Lorin Glass, PA-C  tamsulosin (FLOMAX) 0.4 MG CAPS capsule TAKE 1 CAPSULE (0.4 MG TOTAL) BY MOUTH DAILY AS NEEDED (TO HELP WITH URINATION). 11/21/20  Yes Gifford Shave, MD  vitamin C (ASCORBIC ACID) 500 MG tablet Take 1,000 mg by mouth daily.   Yes [provider]  linaclotide (LINZESS) 290 MCG CAPS capsule Take 1 capsule (290 mcg total) by mouth daily as needed (constipation, take  before breakfast). Patient not taking: No sig reported 10/05/20   Mendel Corning, MD    Physical Exam: Vitals:   11/27/20 1500 11/27/20 1515 11/27/20 1530 11/27/20 1545  BP: 125/70 130/62 132/65 131/60  Pulse: 82 80 79 79  Resp: 18 17 16 16   Temp:      TempSrc:      SpO2: 97% 100% 98% 98%    Constitutional: NAD, calm, comfortable Vitals:   11/27/20 1500 11/27/20 1515 11/27/20 1530 11/27/20 1545  BP: 125/70 130/62 132/65 131/60  Pulse: 82 80 79 79  Resp: 18 17 16 16   Temp:      TempSrc:      SpO2: 97% 100% 98% 98%   Eyes: PERRL, lids and conjunctivae normal ENMT: Mucous membranes are moist. Posterior pharynx clear of any exudate or lesions.Normal dentition.  Neck: normal, supple, no masses, no thyromegaly Respiratory: clear to auscultation bilaterally, no wheezing, no crackles. Normal respiratory effort. No accessory muscle use.  Cardiovascular: Regular rate and rhythm, no murmurs / rubs / gallops. No extremity edema. 2+ pedal pulses. No carotid bruits.  Abdomen: Hypoactive bowel sounds, tender right lower quadrant, no rebound or guarding Musculoskeletal: no clubbing / cyanosis. No joint deformity upper and lower extremities. Good ROM, no contractures. Normal muscle tone.  Skin: no rashes, lesions, ulcers. No induration, bilateral nephrostomy tubes in place Neurologic: CN 2-12 grossly intact. Sensation intact, Strength 3/5 in all 4.  Psychiatric: Normal judgment and insight. Alert and oriented x 3.  Patient anxious   Labs on Admission: I have personally reviewed following labs and imaging studies  CBC: Recent Labs  Lab 11/25/20 1554 11/27/20 1035  WBC 30.0* 24.4*  NEUTROABS 24.0* 21.7*  HGB 7.5* 6.6*  HCT 23.9* 21.9*  MCV 90 96.1  PLT 443 354    Basic Metabolic Panel: Recent Labs  Lab 11/25/20 1554 11/27/20 1035  NA 143 144  K 4.2 3.0*  CL 106 112*  CO2 21 25  GLUCOSE 139* 111*  BUN 23 19  CREATININE 1.11 0.87  CALCIUM 9.5 8.8*  MG  --  1.5*     GFR: Estimated Creatinine Clearance: 83.1 mL/min (by C-G formula based on SCr of 0.87 mg/dL).  Liver Function Tests: Recent Labs  Lab 11/27/20 1035  AST 33  ALT 21  ALKPHOS 113  BILITOT 0.4  PROT 6.6  ALBUMIN 2.3*    Urine analysis:    Component Value Date/Time   COLORURINE RED (A) 11/27/2020 1525   COLORURINE YELLOW 11/27/2020 1525   APPEARANCEUR CLOUDY (A) 11/27/2020 1525  APPEARANCEUR CLOUDY (A) 11/27/2020 1525   LABSPEC  11/27/2020 1525    TEST NOT REPORTED DUE TO COLOR INTERFERENCE OF URINE PIGMENT   LABSPEC 1.016 11/27/2020 1525   PHURINE  11/27/2020 1525    TEST NOT REPORTED DUE TO COLOR INTERFERENCE OF URINE PIGMENT   PHURINE 6.0 11/27/2020 1525   GLUCOSEU (A) 11/27/2020 1525    TEST NOT REPORTED DUE TO COLOR INTERFERENCE OF URINE PIGMENT   GLUCOSEU NEGATIVE 11/27/2020 1525   HGBUR (A) 11/27/2020 1525    TEST NOT REPORTED DUE TO COLOR INTERFERENCE OF URINE PIGMENT   HGBUR LARGE (A) 11/27/2020 1525   BILIRUBINUR (A) 11/27/2020 1525    TEST NOT REPORTED DUE TO COLOR INTERFERENCE OF URINE PIGMENT   BILIRUBINUR NEGATIVE 11/27/2020 1525   BILIRUBINUR negative 09/14/2019 1105   KETONESUR (A) 11/27/2020 1525    TEST NOT REPORTED DUE TO COLOR INTERFERENCE OF URINE PIGMENT   KETONESUR NEGATIVE 11/27/2020 1525   PROTEINUR (A) 11/27/2020 1525    TEST NOT REPORTED DUE TO COLOR INTERFERENCE OF URINE PIGMENT   PROTEINUR 100 (A) 11/27/2020 1525   UROBILINOGEN 0.2 09/14/2019 1105   NITRITE (A) 11/27/2020 1525    TEST NOT REPORTED DUE TO COLOR INTERFERENCE OF URINE PIGMENT   NITRITE NEGATIVE 11/27/2020 1525   LEUKOCYTESUR (A) 11/27/2020 1525    TEST NOT REPORTED DUE TO COLOR INTERFERENCE OF URINE PIGMENT   LEUKOCYTESUR NEGATIVE 11/27/2020 1525    Radiological Exams on Admission: CT ABDOMEN PELVIS W CONTRAST  Result Date: 11/27/2020 CLINICAL DATA:  Bowel obstruction suspected, history of prostate cancer, ongoing chemotherapy EXAM: CT ABDOMEN AND PELVIS WITH  CONTRAST TECHNIQUE: Multidetector CT imaging of the abdomen and pelvis was performed using the standard protocol following bolus administration of intravenous contrast. CONTRAST:  83mL OMNIPAQUE IOHEXOL 350 MG/ML SOLN, additional oral enteric contrast COMPARISON:  CT abdomen pelvis, 11/20/2020 FINDINGS: Lower chest: Trace bilateral pleural effusions and associated atelectasis or consolidation. Hepatobiliary: No solid liver abnormality is seen. No gallstones, gallbladder wall thickening, or biliary dilatation. Pancreas: Unremarkable. No pancreatic ductal dilatation or surrounding inflammatory changes. Spleen: Normal in size without significant abnormality. Adrenals/Urinary Tract: Adrenal glands are unremarkable. Bilateral percutaneous nephrostomy tubes with formed pigtails in the renal pelves. Additional bilateral double-J ureteral stent catheters with formed pigtails in the renal pelves and urinary bladder. The urinary bladder is again severely thickened. Stomach/Bowel: Stomach is within normal limits. Appendix appears normal. There is again noted a large burden of stool throughout the colon and rectum, with very large stool balls in the distended sigmoid colon. The caliber of the colon at this level is 9.9 cm, previously no greater than 6.5 cm (series 4, image 57). Vascular/Lymphatic: Aortic atherosclerosis. Multiple enlarged retroperitoneal, bilateral iliac, and pelvic sidewall lymph nodes are unchanged from recent CT. Reproductive: Gross prostatomegaly. Other: Mild anasarca. Trace ascites. Right eccentric anterior pelvic mass is unchanged compared to recent prior CT (series 2, image 88). Musculoskeletal: Multiple sclerotic osseous metastases, unchanged. IMPRESSION: 1. There is again noted a large burden of stool throughout the colon and rectum, with very large stool balls in the distended sigmoid colon. The caliber of the colon at this level is 9.9 cm, previously no greater than 6.5 cm. Findings are concerning  for fecal impaction, with abnormal transit due to adjacent prostate malignancy. 2. Bilateral percutaneous nephrostomy tubes with formed pigtails in the renal pelves and urinary bladder. Additional bilateral double-J ureteral stent catheters with formed pigtails in the renal pelves and urinary bladder. No hydronephrosis. 3. Findings of advanced  metastatic prostate malignancy not significantly changed compared to recent CT. 4. The urinary bladder is again severely thickened, likely related to chronic outlet obstruction, possibly with superimposed infectious or inflammatory cystitis. 5. Trace bilateral pleural effusions and associated atelectasis or consolidation. 6. Trace ascites and anasarca. Aortic Atherosclerosis (ICD10-I70.0). Electronically Signed   By: Eddie Candle M.D.   On: 11/27/2020 13:58    EKG: Independently reviewed.   Assessment/Plan Principal Problem:   Fecal impaction (HCC) Active Problems:   Metastatic castration-resistant adenocarcinoma of prostate (HCC)   BPH (benign prostatic hyperplasia)   Symptomatic anemia   Hypokalemia   Hypomagnesemia   DVT of deep femoral vein, left ([place]) 70 year old male presents with fecal impaction secondary to prostate cancer along with home Dilaudid which he takes to 3 times a day.  Patient has tried senna and MiraLAX in the past without relief. Fecal impaction-continue home meds-movantick, plecanatide , will give Colace and senna, trial of tapwater enemas in the ED if that does not improve then start lactulose enema Discussed with Dr. Collene Mares gastroenterology over the phone-she will evaluate  Prostate cancer with subsequent pain-continue as needed Tylenol, hydrocodone and Dilaudid  Acute on chronic anemia from cancer-getting 1 unit of PRBCs in the ED.  DVT-Heparin drip  Hypokalemia-replaced IV, follow morning labs  Hypomagnesemia-replaced IV, follow morning labs  Disposition-admit patient, telemetry, continue to monitor    DVT  prophylaxis: heparin Code Status:   Full-pt desires to be a full code-brother is emergency contact Family Communication:  Sister briefly at bedside Disposition Plan:   Patient is from:  home  Anticipated DC to:  home  Anticipated DC date:  tbd  Anticipated DC barriers: tbd  Consults called:  Gi-Dr. Collene Mares; surgery-Dr. Kae Heller Admission status:  Inpt, tele  Severity of Illness: The appropriate patient status for this patient is INPATIENT. Inpatient status is judged to be reasonable and necessary in order to provide the required intensity of service to ensure the patient's safety. The patient's presenting symptoms, physical exam findings, and initial radiographic and laboratory data in the context of their chronic comorbidities is felt to place them at high risk for further clinical deterioration. Furthermore, it is not anticipated that the patient will be medically stable for discharge from the hospital within 2 midnights of admission. The following factors support the patient status of inpatient.   " The patient's presenting symptoms include as above. " The worrisome physical exam findings include as above. " The initial radiographic and laboratory data are worrisome because of as above. " The chronic co-morbidities include as above.   * I certify that at the point of admission it is my clinical judgment that the patient will require inpatient hospital care spanning beyond 2 midnights from the point of admission due to high intensity of service, high risk for further deterioration and high frequency of surveillance required.Jacqlyn Krauss MD [facility] [phone] How to contact the [facility] Attending or Consulting provider [place] or covering provider during after hours [place], for this patient?   Check the care team in [facility] and look for a) attending/consulting [facility] provider listed and b) the [facility] team listed Log into [email or web address] and use [place]'s universal password to  access. If you do not have the password, please contact the hospital operator. Locate the [facility] provider you are looking for under [facility] and page to a number that you can be directly reached. If you still have difficulty reaching the provider, please page the  DOC (Director on Call) for the Hospitalists listed on amion for assistance.  11/27/2020, 5:01 PM

## 2020-11-27 NOTE — ED Notes (Signed)
Lenard Forth, RN has been sent a secure message in regards to admission bed assignment

## 2020-11-27 NOTE — Plan of Care (Signed)
  Problem: Health Behavior/Discharge Planning: Goal: Ability to manage health-related needs will improve Outcome: Progressing   Problem: Clinical Measurements: Goal: Ability to maintain clinical measurements within normal limits will improve Outcome: Progressing Goal: Will remain free from infection Outcome: Progressing Goal: Diagnostic test results will improve Outcome: Progressing   

## 2020-11-27 NOTE — ED Notes (Signed)
Patient is incont of stool- his adult diaper was full of soft brown stool, cleaned and lines changed.  Rectal exam, no hard stool noted.  Fleets enema given- unable to hold due to incont.

## 2020-11-27 NOTE — Consult Note (Signed)
UNASSIGNED PATIENT Reason for Consult: Severe constipation with possible fecal impaction. Referring Physician: [facility] hospital[facility].  Andre Glass is an 70 y.o. male.  HPI: Andre Glass is a 70 year old black male with multiple medical problems listed below who presented to emergency to the emergency room at [facility] today with worsening constipation and severe abdominal pain. He claims he had a small volume BM earlier today.  The scan on admission showed large burden of stool throughout the colon and rectum with large stool balls in the distended sigmoid colon that is dilated to 9.9 cm; these findings are very concerning for fecal impaction however the patient has had bilateral percutaneous nephrostomy tubes with pigtails in the renal pelvis and urinary bladder and these findings seem to be complicated by advanced metastatic prostate cancer; additionally urinary bladder is severely thickened with chronic outlet obstruction superimposed on an infectious versus inflammatory cystitis.  Patient seems quite upset uncomfortable with abdominal pain and discomfort and is unable to give me much details on his history.  Past Medical History:  Diagnosis Date   Anemia    History of kidney stones    Prostate cancer ([place])    UTI (urinary tract infection)    Past Surgical History:  Procedure Laterality Date   IR IMAGING GUIDED PORT INSERTION  01/31/2020   IR NEPHROSTOGRAM LEFT THRU EXISTING ACCESS  11/20/2020   IR NEPHROSTOGRAM RIGHT THRU EXISTING ACCESS  11/20/2020   IR NEPHROSTOMY EXCHANGE RIGHT  10/25/2020   IR NEPHROSTOMY PLACEMENT LEFT  09/29/2020   IR NEPHROSTOMY PLACEMENT LEFT  11/02/2020   IR NEPHROSTOMY PLACEMENT RIGHT  09/29/2020   IR NEPHROSTOMY PLACEMENT RIGHT  11/02/2020   IR URETERAL STENT PERC EXCHG MOD SED  11/19/2020   IR URETERAL STENT PERC EXCHG MOD SED  11/19/2020   TRANSURETHRAL RESECTION OF PROSTATE N/A 11/08/2019   Procedure: TRANSURETHRAL RESECTION OF THE PROSTATE (TURP);  Surgeon: [name], MD;  Location: [facility];  Service: Urology;  Laterality: N/A;    Family History  Problem Relation Age of Onset   Stomach cancer Mother    Pancreatic cancer Sister    Breast cancer Neg Hx    Prostate cancer Neg Hx    Colon cancer Neg Hx     Social History:  reports that he has never smoked. He has never used smokeless tobacco. He reports that he does not drink alcohol and does not use drugs.  Allergies:  Allergies  Allergen Reactions   Ibuprofen Anaphylaxis   Shrimp [Shellfish Allergy] Anaphylaxis    Medications: I have reviewed the patient's current medications. Prior to Admission: (Not in a hospital admission)  Scheduled:  vitamin C  1,000 mg Oral Daily   cephALEXin  500 mg Oral TID   docusate sodium  100 mg Oral BID   feeding supplement  237 mL Oral TID BM   mirtazapine  7.5 mg Oral QHS   multivitamin with minerals  1 tablet Oral Daily   naloxegol oxalate  12.5 mg Oral Daily   Plecanatide  3 mg Oral Daily   [START ON 11/28/2020] predniSONE  5 mg Oral Q breakfast   pregabalin  50 mg Oral BID   senna  1 tablet Oral BID   Continuous:  sodium chloride 125 mL/hr at 11/27/20 1238   sodium chloride     dextrose 5 % and 0.9 % NaCl with KCl 40 mEq/L     heparin 1,200 Units/hr (11/27/20 1823)    Results for orders placed or  performed during the hospital encounter of 11/27/20 (from the past 48 hour(s))  Comprehensive metabolic panel     Status: Abnormal   Collection Time: 11/27/20 10:35 AM  Result Value Ref Range   Sodium 144 135 - 145 mmol/L   Potassium 3.0 (L) 3.5 - 5.1 mmol/L   Chloride 112 (H) 98 - 111 mmol/L   CO2 25 22 - 32 mmol/L   Glucose, Bld 111 (H) 70 - 99 mg/dL    Comment: Glucose reference range applies only to samples taken after fasting for at least 8 hours.   BUN 19 8 - 23 mg/dL   Creatinine, Ser 0.87 0.61 - 1.24 mg/dL   Calcium 8.8 (L) 8.9 - 10.3 mg/dL   Total Protein 6.6 6.5 - 8.1 g/dL   Albumin 2.3 (L) 3.5 - 5.0 g/dL   AST 33 15 - 41 U/L    ALT 21 0 - 44 U/L   Alkaline Phosphatase 113 38 - 126 U/L   Total Bilirubin 0.4 0.3 - 1.2 mg/dL   GFR, Estimated >60 >60 mL/min    Comment: (NOTE) Calculated using the CKD-EPI Creatinine Equation (2021)    Anion gap 7 5 - 15    Comment: Performed at [facility], [place] [address] [place], [place] [address]  Lipase, blood     Status: None   Collection Time: 11/27/20 10:35 AM  Result Value Ref Range   Lipase 22 11 - 51 U/L    Comment: Performed at [facility], [place] [address], [place], [place] [address]  CBC with Diff     Status: Abnormal   Collection Time: 11/27/20 10:35 AM  Result Value Ref Range   WBC 24.4 (H) 4.0 - 10.5 K/uL   RBC 2.28 (L) 4.22 - 5.81 MIL/uL   Hemoglobin 6.6 (LL) 13.0 - 17.0 g/dL    Comment: REPEATED TO VERIFY THIS CRITICAL RESULT HAS VERIFIED AND BEEN CALLED TO [facility]. RN BY NICOLE MCCOY ON 08 24 2022 AT 1150, AND HAS BEEN READ BACK. CRITICAL RESULT VERIFIED    HCT 21.9 (L) 39.0 - 52.0 %   MCV 96.1 80.0 - 100.0 fL   MCH 28.9 26.0 - 34.0 pg   MCHC 30.1 30.0 - 36.0 g/dL   RDW 17.2 (H) 11.5 - 15.5 %   Platelets 296 150 - 400 K/uL   nRBC 0.9 (H) 0.0 - 0.2 %   Neutrophils Relative % 84 %   Neutro Abs 21.7 (H) 1.7 - 7.7 K/uL   Band Neutrophils 5 %   Lymphocytes Relative 5 %   Lymphs Abs 1.2 0.7 - 4.0 K/uL   Monocytes Relative 3 %   Monocytes Absolute 0.7 0.1 - 1.0 K/uL   Eosinophils Relative 0 %   Eosinophils Absolute 0.0 0.0 - 0.5 K/uL   Basophils Relative 0 %   Basophils Absolute 0.0 0.0 - 0.1 K/uL   WBC Morphology MILD LEFT SHIFT (1-5% METAS, OCC MYELO, OCC BANDS)    Myelocytes 3 %   Abs Immature Granulocytes 0.70 (H) 0.00 - 0.07 K/uL   Polychromasia PRESENT     Comment: Performed at [facility], [place] [address] [place], [place] [address]  Lactic acid, plasma     Status: None   Collection Time: 11/27/20 10:35 AM  Result Value Ref Range   Lactic Acid, Venous 0.8 0.5 - 1.9 mmol/L    Comment:  Performed at [facility], [place] [address], [place], [place] [address]  Magnesium  Status: Abnormal   Collection Time: 11/27/20 10:35 AM  Result Value Ref Range   Magnesium 1.5 (L) 1.7 - 2.4 mg/dL    Comment: Performed at [facility], [place] [address] [place], [place] [address]  Resp Panel by RT-PCR (Flu A&B, Covid) Nasopharyngeal Swab     Status: None   Collection Time: 11/27/20  1:51 PM   Specimen: Nasopharyngeal Swab; Nasopharyngeal(NP) swabs in vial transport medium  Result Value Ref Range   SARS Coronavirus 2 by RT PCR NEGATIVE NEGATIVE    Comment: (NOTE) SARS-CoV-2 target nucleic acids are NOT DETECTED.  The SARS-CoV-2 RNA is generally detectable in upper respiratory specimens during the acute phase of infection. The lowest concentration of SARS-CoV-2 viral copies this assay can detect is 138 copies/mL. A negative result does not preclude SARS-Cov-2 infection and should not be used as the sole basis for treatment or other patient management decisions. A negative result may occur with  improper specimen collection/handling, submission of specimen other than nasopharyngeal swab, presence of viral mutation(s) within the areas targeted by this assay, and inadequate number of viral copies(<138 copies/mL). A negative result must be combined with clinical observations, patient history, and epidemiological information. The expected result is Negative.  Fact Sheet for Patients:  EntrepreneurPulse.com.au  Fact Sheet for Healthcare Providers:  IncredibleEmployment.be  This test is no t yet approved or cleared by the [place] FDA and  has been authorized for detection and/or diagnosis of SARS-CoV-2 by FDA under an Emergency Use Authorization (EUA). This EUA will remain  in effect (meaning this test can be used) for the duration of the COVID-19 declaration under Section 564(b)(1) of the Act,  21 U.S.C.section 360bbb-3(b)(1), unless the authorization is terminated  or revoked sooner.       Influenza A by PCR NEGATIVE NEGATIVE   Influenza B by PCR NEGATIVE NEGATIVE    Comment: (NOTE) The Xpert Xpress SARS-CoV-2/FLU/RSV plus assay is intended as an aid in the diagnosis of influenza from Nasopharyngeal swab specimens and should not be used as a sole basis for treatment. Nasal washings and aspirates are unacceptable for Xpert Xpress SARS-CoV-2/FLU/RSV testing.  Fact Sheet for Patients: EntrepreneurPulse.com.au  Fact Sheet for Healthcare Providers: IncredibleEmployment.be  This test is not yet approved or cleared by the [place] FDA and has been authorized for detection and/or diagnosis of SARS-CoV-2 by FDA under an Emergency Use Authorization (EUA). This EUA will remain in effect (meaning this test can be used) for the duration of the COVID-19 declaration under Section 564(b)(1) of the Act, 21 U.S.C. section 360bbb-3(b)(1), unless the authorization is terminated or revoked.  Performed at [facility], [place] [address], [place], [place] [address]   Urinalysis, Complete w Microscopic Urine, Bag (ped)     Status: Abnormal   Collection Time: 11/27/20  3:25 PM  Result Value Ref Range   Color, Urine RED (A) YELLOW    Comment: BIOCHEMICALS MAY BE AFFECTED BY COLOR   APPearance CLOUDY (A) CLEAR   Specific Gravity, Urine  1.005 - 1.030    TEST NOT REPORTED DUE TO COLOR INTERFERENCE OF URINE PIGMENT   pH  5.0 - 8.0    TEST NOT REPORTED DUE TO COLOR INTERFERENCE OF URINE PIGMENT   Glucose, UA (A) NEGATIVE mg/dL    TEST NOT REPORTED DUE TO COLOR INTERFERENCE OF URINE PIGMENT   Hgb urine dipstick (A) NEGATIVE    TEST NOT REPORTED DUE TO COLOR INTERFERENCE OF URINE PIGMENT   Bilirubin Urine (A) NEGATIVE  TEST NOT REPORTED DUE TO COLOR INTERFERENCE OF URINE PIGMENT   Ketones, ur (A) NEGATIVE mg/dL    TEST NOT REPORTED  DUE TO COLOR INTERFERENCE OF URINE PIGMENT   Protein, ur (A) NEGATIVE mg/dL    TEST NOT REPORTED DUE TO COLOR INTERFERENCE OF URINE PIGMENT   Nitrite (A) NEGATIVE    TEST NOT REPORTED DUE TO COLOR INTERFERENCE OF URINE PIGMENT   Leukocytes,Ua (A) NEGATIVE    TEST NOT REPORTED DUE TO COLOR INTERFERENCE OF URINE PIGMENT   RBC / HPF >50 (H) 0 - 5 RBC/hpf   WBC, UA 21-50 0 - 5 WBC/hpf   Bacteria, UA NONE SEEN NONE SEEN    Comment: Performed at [facility], [place] [address], [place], [place] [address]  Urinalysis, Routine w reflex microscopic Urine, Bag (ped)     Status: Abnormal   Collection Time: 11/27/20  3:25 PM  Result Value Ref Range   Color, Urine YELLOW YELLOW   APPearance CLOUDY (A) CLEAR   Specific Gravity, Urine 1.016 1.005 - 1.030   pH 6.0 5.0 - 8.0   Glucose, UA NEGATIVE NEGATIVE mg/dL   Hgb urine dipstick LARGE (A) NEGATIVE   Bilirubin Urine NEGATIVE NEGATIVE   Ketones, ur NEGATIVE NEGATIVE mg/dL   Protein, ur 100 (A) NEGATIVE mg/dL   Nitrite NEGATIVE NEGATIVE   Leukocytes,Ua NEGATIVE NEGATIVE   RBC / HPF >50 (H) 0 - 5 RBC/hpf   WBC, UA 0-5 0 - 5 WBC/hpf   Bacteria, UA NONE SEEN NONE SEEN    Comment: Performed at [facility], [place] [address], [place], [place] [address]    CT ABDOMEN PELVIS W CONTRAST  Result Date: 11/27/2020 CLINICAL DATA:  Bowel obstruction suspected, history of prostate cancer, ongoing chemotherapy EXAM: CT ABDOMEN AND PELVIS WITH CONTRAST TECHNIQUE: Multidetector CT imaging of the abdomen and pelvis was performed using the standard protocol following bolus administration of intravenous contrast. CONTRAST:  57mL OMNIPAQUE IOHEXOL 350 MG/ML SOLN, additional oral enteric contrast COMPARISON:  CT abdomen pelvis, 11/20/2020 FINDINGS: Lower chest: Trace bilateral pleural effusions and associated atelectasis or consolidation. Hepatobiliary: No solid liver abnormality is seen. No gallstones, gallbladder wall thickening, or  biliary dilatation. Pancreas: Unremarkable. No pancreatic ductal dilatation or surrounding inflammatory changes. Spleen: Normal in size without significant abnormality. Adrenals/Urinary Tract: Adrenal glands are unremarkable. Bilateral percutaneous nephrostomy tubes with formed pigtails in the renal pelves. Additional bilateral double-J ureteral stent catheters with formed pigtails in the renal pelves and urinary bladder. The urinary bladder is again severely thickened. Stomach/Bowel: Stomach is within normal limits. Appendix appears normal. There is again noted a large burden of stool throughout the colon and rectum, with very large stool balls in the distended sigmoid colon. The caliber of the colon at this level is 9.9 cm, previously no greater than 6.5 cm (series 4, image 57). Vascular/Lymphatic: Aortic atherosclerosis. Multiple enlarged retroperitoneal, bilateral iliac, and pelvic sidewall lymph nodes are unchanged from recent CT. Reproductive: Gross prostatomegaly. Other: Mild anasarca. Trace ascites. Right eccentric anterior pelvic mass is unchanged compared to recent prior CT (series 2, image 88). Musculoskeletal: Multiple sclerotic osseous metastases, unchanged. IMPRESSION: 1. There is again noted a large burden of stool throughout the colon and rectum, with very large stool balls in the distended sigmoid colon. The caliber of the colon at this level is 9.9 cm, previously no greater than 6.5 cm. Findings are concerning for fecal impaction, with abnormal transit due to adjacent prostate malignancy. 2. Bilateral percutaneous nephrostomy tubes with formed  pigtails in the renal pelves and urinary bladder. Additional bilateral double-J ureteral stent catheters with formed pigtails in the renal pelves and urinary bladder. No hydronephrosis. 3. Findings of advanced metastatic prostate malignancy not significantly changed compared to recent CT. 4. The urinary bladder is again severely thickened, likely related to  chronic outlet obstruction, possibly with superimposed infectious or inflammatory cystitis. 5. Trace bilateral pleural effusions and associated atelectasis or consolidation. 6. Trace ascites and anasarca. Aortic Atherosclerosis (ICD10-I70.0). Electronically Signed   By: Eddie Candle M.D.   On: 11/27/2020 13:58    Review of Systems  Constitutional:  Positive for activity change, appetite change and fatigue. Negative for chills, diaphoresis and unexpected weight change.  HENT: Negative.    Eyes: Negative.   Respiratory: Negative.    Cardiovascular: Negative.   Gastrointestinal:  Positive for abdominal distention, abdominal pain and constipation. Negative for anal bleeding, blood in stool, diarrhea, nausea, rectal pain and vomiting.  Genitourinary:  Positive for difficulty urinating.  Musculoskeletal:  Positive for arthralgias, back pain and joint swelling.  Neurological:  Positive for dizziness and weakness.  Psychiatric/Behavioral:  Positive for agitation. The patient is nervous/anxious.   Blood pressure 131/60, pulse 79, temperature 97.8 F (36.6 C), temperature source Oral, resp. rate 16, SpO2 98 %. Physical Exam Vitals reviewed.  Constitutional:      General: He is in acute distress.     Appearance: He is well-developed. He is ill-appearing. He is not diaphoretic.  HENT:     Head: Normocephalic and atraumatic.     Mouth/Throat:     Pharynx: Oropharynx is clear.  Eyes:     Extraocular Movements: Extraocular movements intact.     Pupils: Pupils are equal, round, and reactive to light.  Cardiovascular:     Rate and Rhythm: Normal rate and regular rhythm.     Heart sounds: Normal heart sounds.  Pulmonary:     Breath sounds: Normal breath sounds.  Abdominal:     General: Abdomen is protuberant. Bowel sounds are absent. There is distension.     Palpations: There is no shifting dullness, fluid wave or splenomegaly.     Tenderness: There is generalized abdominal tenderness.  Skin:     General: Skin is warm and dry.  Neurological:     Mental Status: He is alert and oriented to person, place, and time.   Assessment/Plan: 1) Severe constipation with fecal impaction-severely dilated colon at 9.9 cm secondary to advanced metastatic prostate cancer-agree with aggressive bowel regimen tonight and repeat films tomorrow morning. I think palliative care consult is in order at this point considering the patient's comorbidities and advanced cancer.  Appreciate the input from the surgeons. He may need a colostomy if his symptoms do not improve with a bowel regimen. 2) Acute on chronic anemia-hemoglobin down to 6.6 g/dL. 3) DVT of the left deep femoral vein on Xarelto at home. 4) metastatic castration resistant adenocarcinoma prostate/BPH. 5) Hypokalemia hypomagnesia-correct electrolytes. Juanita Craver 11/27/2020, 5:10 PM

## 2020-11-27 NOTE — ED Triage Notes (Signed)
Arrives via EMS- from his home.  History of pancreatic cancer, just completed his 6th round of chemo.  Brought in for increased abd pain x 2 weeks.

## 2020-11-28 ENCOUNTER — Inpatient Hospital Stay (HOSPITAL_COMMUNITY): Payer: Medicare Other

## 2020-11-28 ENCOUNTER — Inpatient Hospital Stay (HOSPITAL_COMMUNITY): Payer: Medicare Other | Admitting: Certified Registered Nurse Anesthetist

## 2020-11-28 ENCOUNTER — Encounter (HOSPITAL_COMMUNITY)
Admission: EM | Disposition: A | Discharge status: Nursing Home (Includes ICF) | Payer: Self-pay | Source: Home / Self Care | Attending: Internal Medicine

## 2020-11-28 DIAGNOSIS — K5641 Fecal impaction: Secondary | ICD-10-CM

## 2020-11-28 DIAGNOSIS — E876 Hypokalemia: Secondary | ICD-10-CM

## 2020-11-28 DIAGNOSIS — D649 Anemia, unspecified: Secondary | ICD-10-CM

## 2020-11-28 HISTORY — PX: LAPAROTOMY: SHX154

## 2020-11-28 HISTORY — PX: COLOSTOMY: SHX63

## 2020-11-28 LAB — POCT I-STAT 7, (LYTES, BLD GAS, ICA,H+H)
Acid-base deficit: 10 mmol/L — ABNORMAL HIGH (ref 0.0–2.0)
Acid-base deficit: 8 mmol/L — ABNORMAL HIGH (ref 0.0–2.0)
Bicarbonate: 17.6 mmol/L — ABNORMAL LOW (ref 20.0–28.0)
Bicarbonate: 17.7 mmol/L — ABNORMAL LOW (ref 20.0–28.0)
Calcium, Ion: 1.22 mmol/L (ref 1.15–1.40)
Calcium, Ion: 1.22 mmol/L (ref 1.15–1.40)
HCT: 29 % — ABNORMAL LOW (ref 39.0–52.0)
HCT: 33 % — ABNORMAL LOW (ref 39.0–52.0)
Hemoglobin: 11.2 g/dL — ABNORMAL LOW (ref 13.0–17.0)
Hemoglobin: 9.9 g/dL — ABNORMAL LOW (ref 13.0–17.0)
O2 Saturation: 99 %
O2 Saturation: 99 %
Patient temperature: 37.3
Patient temperature: 37.7
Potassium: 3.7 mmol/L (ref 3.5–5.1)
Potassium: 4.4 mmol/L (ref 3.5–5.1)
Sodium: 142 mmol/L (ref 135–145)
Sodium: 143 mmol/L (ref 135–145)
TCO2: 19 mmol/L — ABNORMAL LOW (ref 22–32)
TCO2: 19 mmol/L — ABNORMAL LOW (ref 22–32)
pCO2 arterial: 38.5 mmHg (ref 32.0–48.0)
pCO2 arterial: 46.1 mmHg (ref 32.0–48.0)
pH, Arterial: 7.194 — CL (ref 7.350–7.450)
pH, Arterial: 7.272 — ABNORMAL LOW (ref 7.350–7.450)
pO2, Arterial: 156 mmHg — ABNORMAL HIGH (ref 83.0–108.0)
pO2, Arterial: 170 mmHg — ABNORMAL HIGH (ref 83.0–108.0)

## 2020-11-28 LAB — CBC
HCT: 21.5 % — ABNORMAL LOW (ref 39.0–52.0)
HCT: 33.1 % — ABNORMAL LOW (ref 39.0–52.0)
Hemoglobin: 6.6 g/dL — CL (ref 13.0–17.0)
Hemoglobin: 10.4 g/dL — ABNORMAL LOW (ref 13.0–17.0)
MCH: 29.5 pg (ref 26.0–34.0)
MCH: 29.3 pg (ref 26.0–34.0)
MCHC: 30.7 g/dL (ref 30.0–36.0)
MCHC: 31.4 g/dL (ref 30.0–36.0)
MCV: 96 fL (ref 80.0–100.0)
MCV: 93.2 fL (ref 80.0–100.0)
Platelets: 275 10*3/uL (ref 150–400)
Platelets: 194 10*3/uL (ref 150–400)
RBC: 2.24 MIL/uL — ABNORMAL LOW (ref 4.22–5.81)
RBC: 3.55 MIL/uL — ABNORMAL LOW (ref 4.22–5.81)
RDW: 17.6 % — ABNORMAL HIGH (ref 11.5–15.5)
RDW: 17.4 % — ABNORMAL HIGH (ref 11.5–15.5)
WBC: 28.1 10*3/uL — ABNORMAL HIGH (ref 4.0–10.5)
WBC: 5.9 10*3/uL (ref 4.0–10.5)
nRBC: 0.6 % — ABNORMAL HIGH (ref 0.0–0.2)
nRBC: 1.9 % — ABNORMAL HIGH (ref 0.0–0.2)

## 2020-11-28 LAB — COMPREHENSIVE METABOLIC PANEL
ALT: 19 U/L (ref 0–44)
AST: 28 U/L (ref 15–41)
Albumin: 2.1 g/dL — ABNORMAL LOW (ref 3.5–5.0)
Alkaline Phosphatase: 113 U/L (ref 38–126)
Anion gap: 8 (ref 5–15)
BUN: 13 mg/dL (ref 8–23)
CO2: 25 mmol/L (ref 22–32)
Calcium: 8.8 mg/dL — ABNORMAL LOW (ref 8.9–10.3)
Chloride: 108 mmol/L (ref 98–111)
Creatinine, Ser: 0.82 mg/dL (ref 0.61–1.24)
GFR, Estimated: >60 mL/min (ref >60)
Glucose, Bld: 142 mg/dL — ABNORMAL HIGH (ref 70–99)
Potassium: 3.6 mmol/L (ref 3.5–5.1)
Sodium: 141 mmol/L (ref 135–145)
Total Bilirubin: 0.4 mg/dL (ref 0.3–1.2)
Total Protein: 6.4 g/dL — ABNORMAL LOW (ref 6.5–8.1)

## 2020-11-28 LAB — HEPARIN LEVEL (UNFRACTIONATED)
Heparin Unfractionated: 0.78 IU/mL — ABNORMAL HIGH (ref 0.30–0.70)
Heparin Unfractionated: 0.46 IU/mL (ref 0.30–0.70)

## 2020-11-28 LAB — HEMOGLOBIN AND HEMATOCRIT, BLOOD
HCT: 24.7 % — ABNORMAL LOW (ref 39.0–52.0)
Hemoglobin: 7.8 g/dL — ABNORMAL LOW (ref 13.0–17.0)

## 2020-11-28 LAB — PREPARE RBC (CROSSMATCH)

## 2020-11-28 LAB — APTT
aPTT: 59 seconds — ABNORMAL HIGH (ref 24–36)
aPTT: 51 seconds — ABNORMAL HIGH (ref 24–36)

## 2020-11-28 LAB — MAGNESIUM: Magnesium: 2 mg/dL (ref 1.7–2.4)

## 2020-11-28 SURGERY — LAPAROTOMY, EXPLORATORY
Anesthesia: General | Site: Abdomen

## 2020-11-28 MED ORDER — HEPARIN (PORCINE) 25000 UT/250ML-% IV SOLN
1450.0000 [IU]/h | INTRAVENOUS | Status: DC
  Administered 2020-11-29 – 2020-11-30 (×2): 1450 [IU]/h via INTRAVENOUS
  Filled 2020-11-28 (×4): qty 250

## 2020-11-28 MED ORDER — SODIUM CHLORIDE 0.9 % IR SOLN
Status: DC | PRN
  Administered 2020-11-28: 6000 mL

## 2020-11-28 MED ORDER — SORBITOL 70 % SOLN
960.0000 mL | TOPICAL_OIL | Freq: Two times a day (BID) | ORAL | Status: DC
  Administered 2020-11-28: 960 mL via RECTAL
  Filled 2020-11-28: qty 473

## 2020-11-28 MED ORDER — PIPERACILLIN-TAZOBACTAM 3.375 G IVPB
INTRAVENOUS | Status: AC
  Filled 2020-11-28: qty 50

## 2020-11-28 MED ORDER — SUCCINYLCHOLINE CHLORIDE 200 MG/10ML IV SOSY
PREFILLED_SYRINGE | INTRAVENOUS | Status: DC | PRN
  Administered 2020-11-28: 160 mg via INTRAVENOUS

## 2020-11-28 MED ORDER — CEFAZOLIN SODIUM-DEXTROSE 2-4 GM/100ML-% IV SOLN
INTRAVENOUS | Status: AC
  Filled 2020-11-28: qty 100

## 2020-11-28 MED ORDER — PIPERACILLIN-TAZOBACTAM 3.375 G IVPB 30 MIN
3.3750 g | Freq: Once | INTRAVENOUS | Status: AC
  Administered 2020-11-28: 3.375 g via INTRAVENOUS
  Filled 2020-11-28: qty 50

## 2020-11-28 MED ORDER — PHENYLEPHRINE HCL-NACL 20-0.9 MG/250ML-% IV SOLN
INTRAVENOUS | Status: DC | PRN
  Administered 2020-11-28: 40 ug/min via INTRAVENOUS

## 2020-11-28 MED ORDER — OXYCODONE HCL 5 MG/5ML PO SOLN: 5.0000 mg | Freq: Once | ORAL | Status: DC | PRN

## 2020-11-28 MED ORDER — HYOSCYAMINE SULFATE ER 0.375 MG PO TB12
0.3750 mg | ORAL_TABLET | Freq: Once | ORAL | Status: DC
  Filled 2020-11-28: qty 1

## 2020-11-28 MED ORDER — ETOMIDATE 2 MG/ML IV SOLN
INTRAVENOUS | Status: DC | PRN
  Administered 2020-11-28: 20 mg via INTRAVENOUS

## 2020-11-28 MED ORDER — ALBUMIN HUMAN 5 % IV SOLN: INTRAVENOUS | Status: DC | PRN

## 2020-11-28 MED ORDER — SIMETHICONE 80 MG PO CHEW
80.0000 mg | CHEWABLE_TABLET | Freq: Four times a day (QID) | ORAL | Status: DC | PRN
  Administered 2020-11-28: 80 mg via ORAL
  Filled 2020-11-28 (×2): qty 1

## 2020-11-28 MED ORDER — ROCURONIUM BROMIDE 10 MG/ML (PF) SYRINGE
PREFILLED_SYRINGE | INTRAVENOUS | Status: DC | PRN
  Administered 2020-11-28: 50 mg via INTRAVENOUS

## 2020-11-28 MED ORDER — SODIUM CHLORIDE 0.9% IV SOLUTION
Freq: Once | INTRAVENOUS | Status: AC
  Administered 2020-11-28: 50 mL/h via INTRAVENOUS

## 2020-11-28 MED ORDER — HYOSCYAMINE SULFATE ER 0.375 MG PO TB12: 0.3750 mg | ORAL_TABLET | Freq: Two times a day (BID) | ORAL | Status: DC

## 2020-11-28 MED ORDER — FENTANYL CITRATE (PF) 100 MCG/2ML IJ SOLN
INTRAMUSCULAR | Status: AC
  Filled 2020-11-28: qty 2

## 2020-11-28 MED ORDER — CEFAZOLIN SODIUM-DEXTROSE 2-4 GM/100ML-% IV SOLN
2.0000 g | Freq: Once | INTRAVENOUS | Status: AC
  Administered 2020-11-28: 2 g via INTRAVENOUS

## 2020-11-28 MED ORDER — ETOMIDATE 2 MG/ML IV SOLN
INTRAVENOUS | Status: AC
  Filled 2020-11-28: qty 10

## 2020-11-28 MED ORDER — OXYCODONE HCL 5 MG PO TABS: 5.0000 mg | ORAL_TABLET | Freq: Once | ORAL | Status: DC | PRN

## 2020-11-28 MED ORDER — LACTULOSE 10 GM/15ML PO SOLN
20.0000 g | Freq: Three times a day (TID) | ORAL | Status: DC
  Administered 2020-11-28: 20 g via ORAL
  Filled 2020-11-28: qty 30

## 2020-11-28 MED ORDER — SODIUM CHLORIDE 0.9 % IV BOLUS
1000.0000 mL | Freq: Once | INTRAVENOUS | Status: AC
  Administered 2020-11-28: 1000 mL via INTRAVENOUS

## 2020-11-28 MED ORDER — LACTATED RINGERS IV SOLN: INTRAVENOUS | Status: DC

## 2020-11-28 MED ORDER — ACETAMINOPHEN 10 MG/ML IV SOLN
1000.0000 mg | Freq: Four times a day (QID) | INTRAVENOUS | Status: AC
  Administered 2020-11-28 – 2020-11-29 (×4): 1000 mg via INTRAVENOUS
  Filled 2020-11-28 (×4): qty 100

## 2020-11-28 MED ORDER — SUGAMMADEX SODIUM 200 MG/2ML IV SOLN
INTRAVENOUS | Status: DC | PRN
  Administered 2020-11-28: 200 mg via INTRAVENOUS

## 2020-11-28 MED ORDER — FENTANYL CITRATE (PF) 100 MCG/2ML IJ SOLN
INTRAMUSCULAR | Status: DC | PRN
  Administered 2020-11-28: 100 ug via INTRAVENOUS

## 2020-11-28 MED ORDER — ACETAMINOPHEN 10 MG/ML IV SOLN
INTRAVENOUS | Status: DC | PRN
  Administered 2020-11-28: 1000 mg via INTRAVENOUS

## 2020-11-28 MED ORDER — MEPERIDINE HCL 50 MG/ML IJ SOLN: 6.2500 mg | INTRAMUSCULAR | Status: DC | PRN

## 2020-11-28 MED ORDER — PHENYLEPHRINE 40 MCG/ML (10ML) SYRINGE FOR IV PUSH (FOR BLOOD PRESSURE SUPPORT)
PREFILLED_SYRINGE | INTRAVENOUS | Status: DC | PRN
  Administered 2020-11-28: 120 ug via INTRAVENOUS

## 2020-11-28 MED ORDER — BOOST / RESOURCE BREEZE PO LIQD CUSTOM: 1.0000 | Freq: Three times a day (TID) | ORAL | Status: DC

## 2020-11-28 MED ORDER — PROMETHAZINE HCL 25 MG/ML IJ SOLN: 6.2500 mg | INTRAMUSCULAR | Status: DC | PRN

## 2020-11-28 MED ORDER — DEXAMETHASONE SODIUM PHOSPHATE 10 MG/ML IJ SOLN
INTRAMUSCULAR | Status: DC | PRN
  Administered 2020-11-28: 5 mg via INTRAVENOUS

## 2020-11-28 MED ORDER — ALBUMIN HUMAN 5 % IV SOLN
INTRAVENOUS | Status: AC
  Filled 2020-11-28: qty 500

## 2020-11-28 MED ORDER — ACETAMINOPHEN 10 MG/ML IV SOLN
INTRAVENOUS | Status: AC
  Filled 2020-11-28: qty 100

## 2020-11-28 MED ORDER — PIPERACILLIN-TAZOBACTAM 3.375 G IVPB
3.3750 g | Freq: Three times a day (TID) | INTRAVENOUS | Status: DC
  Administered 2020-11-28 – 2020-12-20 (×65): 3.375 g via INTRAVENOUS
  Filled 2020-11-28 (×64): qty 50

## 2020-11-28 MED ORDER — PROSOURCE PLUS PO LIQD: 30.0000 mL | Freq: Two times a day (BID) | ORAL | Status: DC

## 2020-11-28 MED ORDER — SODIUM CHLORIDE 0.9% IV SOLUTION: Freq: Once | INTRAVENOUS | Status: DC

## 2020-11-28 MED ORDER — HYDROMORPHONE HCL 1 MG/ML IJ SOLN
INTRAMUSCULAR | Status: AC
  Filled 2020-11-28: qty 1

## 2020-11-28 MED ORDER — LIDOCAINE 2% (20 MG/ML) 5 ML SYRINGE
INTRAMUSCULAR | Status: DC | PRN
  Administered 2020-11-28: 20 mg via INTRAVENOUS

## 2020-11-28 MED ORDER — HEPARIN (PORCINE) 25000 UT/250ML-% IV SOLN
1450.0000 [IU]/h | INTRAVENOUS | Status: DC
  Administered 2020-11-28: 1450 [IU]/h via INTRAVENOUS
  Filled 2020-11-28: qty 250

## 2020-11-28 MED ORDER — PIPERACILLIN-TAZOBACTAM 3.375 G IVPB 30 MIN: 3.3750 g | Freq: Three times a day (TID) | INTRAVENOUS | Status: DC

## 2020-11-28 MED ORDER — HYDROMORPHONE HCL 1 MG/ML IJ SOLN: 0.2500 mg | INTRAMUSCULAR | Status: DC | PRN

## 2020-11-28 MED ORDER — MIDAZOLAM HCL 2 MG/2ML IJ SOLN: 0.5000 mg | Freq: Once | INTRAMUSCULAR | Status: DC | PRN

## 2020-11-28 MED ORDER — EPHEDRINE SULFATE-NACL 50-0.9 MG/10ML-% IV SOSY
PREFILLED_SYRINGE | INTRAVENOUS | Status: DC | PRN
  Administered 2020-11-28: 10 mg via INTRAVENOUS

## 2020-11-28 SURGICAL SUPPLY — 56 items
APL PRP STRL LF DISP 70% ISPRP (MISCELLANEOUS)
BAG COUNTER SPONGE SURGICOUNT (BAG) ×2 IMPLANT
BAG SPNG CNTER NS LX DISP (BAG) ×2
BARRIER SKIN 2 3/4 (OSTOMY) ×2 IMPLANT
BARRIER SKIN OD2.25 2 3/4 FLNG (OSTOMY) IMPLANT
BNDG GAUZE ELAST 4 BULKY (GAUZE/BANDAGES/DRESSINGS) ×1 IMPLANT
BRR SKN FLT 2.75X2.25 2 PC (OSTOMY) ×1
CHLORAPREP W/TINT 26 (MISCELLANEOUS) IMPLANT
COVER MAYO STAND STRL (DRAPES) ×2 IMPLANT
COVER SURGICAL LIGHT HANDLE (MISCELLANEOUS) ×2 IMPLANT
DRAIN CHANNEL 19F RND (DRAIN) ×1 IMPLANT
DRAPE LAPAROSCOPIC ABDOMINAL (DRAPES) ×2 IMPLANT
DRSG OPSITE POSTOP 4X10 (GAUZE/BANDAGES/DRESSINGS) IMPLANT
DRSG OPSITE POSTOP 4X6 (GAUZE/BANDAGES/DRESSINGS) IMPLANT
DRSG OPSITE POSTOP 4X8 (GAUZE/BANDAGES/DRESSINGS) IMPLANT
DRSG PAD ABDOMINAL 8X10 ST (GAUZE/BANDAGES/DRESSINGS) ×1 IMPLANT
DRSG TEGADERM 4X4.75 (GAUZE/BANDAGES/DRESSINGS) ×1 IMPLANT
ELECT REM PT RETURN 15FT ADLT (MISCELLANEOUS) ×2 IMPLANT
EVACUATOR SILICONE 100CC (DRAIN) ×1 IMPLANT
GLOVE SURG ENC MOIS LTX SZ6 (GLOVE) ×4 IMPLANT
GLOVE SURG MICRO LTX SZ6 (GLOVE) ×2 IMPLANT
GLOVE SURG UNDER LTX SZ6.5 (GLOVE) ×4 IMPLANT
GOWN STRL REUS W/TWL LRG LVL3 (GOWN DISPOSABLE) ×3 IMPLANT
GOWN STRL REUS W/TWL XL LVL3 (GOWN DISPOSABLE) ×3 IMPLANT
HANDLE SUCTION POOLE (INSTRUMENTS) IMPLANT
KIT TURNOVER KIT A (KITS) ×2 IMPLANT
NS IRRIG 1000ML POUR BTL (IV SOLUTION) ×6 IMPLANT
PACK GENERAL/GYN (CUSTOM PROCEDURE TRAY) ×2 IMPLANT
POUCH OSTOMY 2 3/4  H 3804 (WOUND CARE) ×2
POUCH OSTOMY 2 3/4 H 3804 (WOUND CARE) ×1
POUCH OSTOMY 2 PC DRNBL 2.75 (WOUND CARE) IMPLANT
SPONGE DRAIN TRACH 4X4 STRL 2S (GAUZE/BANDAGES/DRESSINGS) ×1 IMPLANT
SPONGE T-LAP 18X18 ~~LOC~~+RFID (SPONGE) ×4 IMPLANT
STAPLER CVD CUT GN 40 RELOAD (ENDOMECHANICALS) ×2 IMPLANT
STAPLER CVD CUT GRN 40 RELOAD (ENDOMECHANICALS) IMPLANT
STAPLER PROXIMATE 75MM BLUE (STAPLE) ×1 IMPLANT
STAPLER VISISTAT 35W (STAPLE) IMPLANT
SUCTION POOLE HANDLE (INSTRUMENTS) ×2
SUT ETHILON 2 0 PS N (SUTURE) ×1 IMPLANT
SUT ETHILON 3 0 PS 1 (SUTURE) IMPLANT
SUT MNCRL AB 4-0 PS2 18 (SUTURE) IMPLANT
SUT NOVA T20/GS 25 (SUTURE) IMPLANT
SUT PDS AB 1 TP1 96 (SUTURE) ×2 IMPLANT
SUT SILK 2 0 (SUTURE) ×2
SUT SILK 2 0 SH CR/8 (SUTURE) ×2 IMPLANT
SUT SILK 2-0 18XBRD TIE 12 (SUTURE) ×1 IMPLANT
SUT SILK 3 0 (SUTURE) ×2
SUT SILK 3 0 SH CR/8 (SUTURE) ×2 IMPLANT
SUT SILK 3-0 18XBRD TIE 12 (SUTURE) ×1 IMPLANT
SUT VIC AB 2-0 SH 27 (SUTURE) ×2
SUT VIC AB 2-0 SH 27XBRD (SUTURE) IMPLANT
SUT VIC AB 3-0 SH 18 (SUTURE) ×1 IMPLANT
TOWEL OR 17X26 10 PK STRL BLUE (TOWEL DISPOSABLE) IMPLANT
TOWEL OR NON WOVEN STRL DISP B (DISPOSABLE) ×2 IMPLANT
TRAY FOLEY MTR SLVR 14FR STAT (SET/KITS/TRAYS/PACK) ×1 IMPLANT
TRAY FOLEY MTR SLVR 16FR STAT (SET/KITS/TRAYS/PACK) ×1 IMPLANT

## 2020-11-28 NOTE — Progress Notes (Signed)
ANTICOAGULATION CONSULT NOTE   Pharmacy Consult for heparin Indication: recent bilateral DVT on 8/17 (home xarelto on hold)  Allergies  Allergen Reactions   Ibuprofen Anaphylaxis   Shrimp [Shellfish Allergy] Anaphylaxis    Patient Measurements: wt 83kg, height 68 inches Height: 5\' 8"  (172.7 cm) Weight: 83.5 kg (184 lb) IBW/kg (Calculated) : 68.4 Heparin Dosing Weight: 83 kg  Vital Signs: Temp: 98.4 F (36.9 C) (08/25 0058) Temp Source: Oral (08/25 0058) BP: 107/72 (08/25 0058) Pulse Rate: 71 (08/25 0058)  Labs: Recent Labs    11/25/20 1554 11/27/20 1035 11/27/20 1701 11/28/20 0044 11/28/20 0045  HGB 7.5* 6.6*  --   --   --   HCT 23.9* 21.9*  --   --   --   PLT 443 296  --   --   --   APTT  --   --  34 59*  --   HEPARINUNFRC  --   --  0.91* 0.78*  --   CREATININE 1.11 0.87  --   --  0.82     Estimated Creatinine Clearance: 88.2 mL/min (by C-G formula based on SCr of 0.82 mg/dL).   Medications:  - on xarelto dose pack PTA (on 15mg  bid - last dose taken on 11/26/20 at [facility])  Assessment: Patient is a 70 y.o M with metastatic prostate cancer currently undergoing chemotherapy treatment and on xarelto PTA for bilateral DVTs that was diagnosed on 11/20/20, presented to the ED on 11/27/20 with c/o abdominal pain.  Pharmacy has been consulted to transition patient to heparin drip in case invasive intervention is needed with this admission. baseline aPTT 34 sec, heparin level is elevated at 0.91--> is elevated d/t effect of xarelto  Today, 11/28/2020: aPTT 59 sec subtherapeutic on 1200 units/hr Per RN no new bleeding, pt continues to have hematuria as on admission  Goal of Therapy:  Heparin level 0.3-0.7 units/ml aPTT 66-102 seconds Monitor platelets by anticoagulation protocol: Yes   Plan:  - increase heparin drip to 1400 units/hr - check 6  hr aPTT and heparin level - monitor for s/sx bleeding   Dolly Rias RPh 11/28/2020, 1:51 AM

## 2020-11-28 NOTE — Progress Notes (Signed)
This RN entered pt room to find pt in significant abdominal pain and diaphoretic. Pt stated he was having gas pain. MD made aware and PRN ordered and administered. Pt still with significant pain, BP 92/38 and tachycardic. MD made aware and bolus ordered and given. STAT KUB ordered as well. CCS and rapid response made aware and both to bedside to assess patient.

## 2020-11-28 NOTE — TOC Initial Note (Signed)
Transition of Care [facility]) - Initial/Assessment Note    Patient Details  Name: Andre Glass MRN: [id number] Date of Birth: 09-02-1950  Transition of Care [facility]) CM/SW Contact:    Dessa Phi, RN Phone Number: 11/28/2020, 3:56 PM  Clinical Narrative:  Active w/[facility]-HHRN/PT/OT/ST;has privat duty care-aide;informed friend who answered phone in rm about good rx for meds if too costly-informed that he has part D script coverage-obligated to co pay-voiced understanding.                 Expected Discharge Plan: [place] Barriers to Discharge: Continued Medical Work up   Patient Goals and CMS Choice Patient states their goals for this hospitalization and ongoing recovery are:: go home CMS Medicare.gov Compare Post Acute Care list provided to:: Patient Choice offered to / list presented to : Patient  Expected Discharge Plan and Services Expected Discharge Plan: [place]   Discharge Planning Services: CM Consult   Living arrangements for the past 2 months: Single Family Home                                      Prior Living Arrangements/Services Living arrangements for the past 2 months: Single Family Home Lives with:: Self Patient language and need for interpreter reviewed:: Yes Do you feel safe going back to the place where you live?: Yes      Need for Family Participation in Patient Care: No (Comment) Care giver support system in place?: Yes (comment) Current home services: Homehealth aide, Home OT, Home PT, Home RN, Home RT (Active w/[facility];private duty care.)    Activities of Daily Living Home Assistive Devices/Equipment: Other (Comment), Wheelchair, Radio producer (specify quad or straight), Eyeglasses (rolator, nephrostomy supplies) ADL Screening (condition at time of admission) Patient's cognitive ability adequate to safely complete daily activities?: Yes Is the patient deaf or have difficulty hearing?: No Does the patient have  difficulty seeing, even when wearing glasses/contacts?: No Does the patient have difficulty concentrating, remembering, or making decisions?: No (trouble remembering a particular word at times) Patient able to express need for assistance with ADLs?: Yes Does the patient have difficulty dressing or bathing?: Yes Independently performs ADLs?: No Communication: Independent Dressing (OT): Needs assistance Is this a change from baseline?: Pre-admission baseline Grooming: Independent Feeding: Independent Bathing: Needs assistance Is this a change from baseline?: Pre-admission baseline Toileting: Needs assistance Is this a change from baseline?: Pre-admission baseline In/Out Bed: Needs assistance Is this a change from baseline?: Pre-admission baseline Walks in Home: Needs assistance Is this a change from baseline?: Pre-admission baseline Does the patient have difficulty walking or climbing stairs?: Yes Weakness of Legs: Both (left leg is weaker than the right leg) Weakness of Arms/Hands: None  Permission Sought/Granted Permission sought to share information with : Case Manager Permission granted to share information with : Yes, Verbal Permission Granted  Share Information with NAME: Case Manager           Emotional Assessment Appearance:: Appears stated age Attitude/Demeanor/Rapport: Gracious Affect (typically observed): Accepting Orientation: : Oriented to Self, Oriented to Place, Oriented to  Time, Oriented to Situation Alcohol / Substance Use: Not Applicable Psych Involvement: No (comment)  Admission diagnosis:  Fecal impaction of colon (HCC) [K56.41] Hypokalemia [E87.6] Hypomagnesemia [E83.42] Fecal impaction (HCC) [K56.41] Right lower quadrant abdominal pain [R10.31] Constipation [K59.00] Leukocytosis, unspecified type [D72.829] Patient Active Problem List   Diagnosis Date Noted  Fecal impaction ([place]) 11/27/2020   Hypokalemia 11/27/2020   Hypomagnesemia 11/27/2020    DVT of deep femoral vein, left ([place]) 11/27/2020   Abdominal pain    Severe protein-calorie malnutrition ([place]) 10/21/2020   Acute cystitis without hematuria    Anemia    Bacteremia due to Klebsiella pneumoniae    Altered mental status 10/18/2020   Myoclonus    Symptomatic anemia 10/17/2020   AKI (acute kidney injury) ([place]) 09/28/2020   Hydronephrosis 09/28/2020   Obstructive uropathy 09/28/2020   Intractable pain 01/18/2020   BPH (benign prostatic hyperplasia) 11/08/2019   Catheter-associated urinary tract infection ([place]) 09/14/2019   Drug induced constipation 07/18/2019   Goals of care, counseling/discussion 06/26/2019   Metastatic castration-resistant adenocarcinoma of prostate ([place]) 04/19/2019   PCP:  [name], DO Pharmacy:   [id number] - [place], [place] [address] [place] [address] Phone: [phone] Fax: [phone]  [place] [place] [address] Phone: [phone] Fax: [phone]  [place]), [place] - [address] [place]) [place] [address] Phone: [phone] Fax: [phone]     Social Determinants of Health (SDOH) Interventions    Readmission Risk Interventions Readmission Risk Prevention Plan 10/02/2020  Transportation Screening Complete  PCP or Specialist Appt within 5-7 Days Complete  Home Care Screening Complete  Medication Review (RN CM) Complete  Some recent data might be hidden

## 2020-11-28 NOTE — Progress Notes (Signed)
1 unit PRBC's ordered. Held per MD [facility] pending post transfusion H&H from unit given on previous shift.

## 2020-11-28 NOTE — Progress Notes (Signed)
PROGRESS NOTE    Andre Glass  EGB:[id number] DOB: 01-15-1951 DOA: 11/27/2020 PCP: [name], DO  Brief Narrative:  The patient is a 70 year old African-American male with a past medical history significant for but not limited to metastatic prostate cancer with resultant obstructive uropathy requiring bilateral nephrostomy tube placement with other comorbidities who presented with worsening right-sided abdominal pain that started on the morning of yesterday.  The pain was found to be in the right lower quadrant and was a sharp 10 out of 10 in severity.  Patient dates that he passed gas yesterday and had several loose bowel movements but states nothing made the pain worse or better.  He recently had a CT scan on 11/20/2020 which showed possible malignant stricture was progressive and intra-abdominal metastatic disease and extensive osseous metastatic disease as well as evidence of bilateral lower extremity DVT and pelvic veins being compressed by pelvic adenopathy.  He had right peroneal DVT and acute left femoral DVT extending through the veins of the left lower leg and vascular surgery was consulted at that time and recommended no intervention discharged on anticoagulation with Xarelto.  He is currently on amoxicillin and Trulance without relief.  He was given fleets enema without relief in the ED.  He was also noted to have a hemoglobin of 6.6 down from 7.5 a few days ago.  He was transfused 1 unit PRBCs and general surgery as well as gastroenterology were consulted and they recommended bowel regimen and no surgical intervention unless the patient did not improve however subsequently after his enema today the patien likely perforated secondary to history of colon ulcer and had rigidity and guarding consistent with peritonitis.  General surgery is taking him to the OR for emergent surgery for a laparotomy, partial colectomy and colostomy   Assessment & Plan:   Principal Problem:   Fecal impaction  ([place]) Active Problems:   Metastatic castration-resistant adenocarcinoma of prostate ([place])   BPH (benign prostatic hyperplasia)   Symptomatic anemia   Hypokalemia   Hypomagnesemia   DVT of deep femoral vein, left ([facility])  Fecal impaction with acute on chronic constipation and possible stercoral ulcer with perforation resulting in peritonitis -Patient presents with a 3-day history of fecal impaction secondary to prostate cancer along with his home Dilaudid -He has tried senna and MiraLAX in the past with out relief -Patient takes Movantik and flecainide and he was given Colace and senna trial of tapwater enemas as well as lactulose enemas -GI was consulted and recommended enemas the way he takes them -After his enema today he became extremely hypotensive and had some guarding and rigidity of his abdomen and became diaphoretic -Stat KUB showed possible free air -General surgery was reconsulted and took the patient to the OR emergently -Palliative care has been consulted for goals of care discussion  Prostate cancer with subsequent pain -Continue with Tylenol, hydrocodone and Dilaudid  History of DVT -Continue with heparin drip  Acute on chronic anemia likely of chronic disease -Patient's hemoglobin/hematocrit dropped to 6.6/21.5 and he was typed and screened and transfused 1 unit of PRBCs; after 1 unit of PRBCs hemoglobin/hematocrit improved to 7.8/24.7 -He remains on a heparin drip and will need to monitor for signs and symptoms of bleeding -Continue monitor and trend repeat CBC in a.m.  Hematuria -In the setting of being on anticoagulation -Continue to monitor for gross hematuria and may need urology consultation  Hypokalemia -Improved and resolved  - Continue monitor and replete as necessary  Hypomagnesemia -Improved and  resolved -Continue monitor and replete as necessary  Leukocytosis -In the setting of fecal impaction and perforation now -WBC trended up from 24.4 is now  28.1 -Currently getting cefazolin for surgical prophylaxis and was also on p.o. Keflex 500 mg 3 times daily; also received a dose of IV Zosyn  DVT prophylaxis: Anticoagulated with a heparin drip Code Status: Full code Family Communication: No family present at bedside Disposition Plan: Pending further clinical improvement and clearance by specialists  Status is: Inpatient  Remains inpatient appropriate because:Unsafe d/c plan, IV treatments appropriate due to intensity of illness or inability to take PO, and Inpatient level of care appropriate due to severity of illness  Dispo: The patient is from: Home              Anticipated d/c is to:  TBD              Patient currently is not medically stable to d/c.   Difficult to place patient No  Consultants:  General surgery Gastroenterology Palliative care medicine  Procedures: Emergent surgery  Antimicrobials: Anti-infectives (From admission, onward)    Start     Dose/Rate Route Frequency Ordered Stop   11/28/20 1730  piperacillin-tazobactam (ZOSYN) IVPB 3.375 g       Note to Pharmacy: Given Intraoperative   3.375 g 100 mL/hr over 30 Minutes Intravenous  Once 11/28/20 1729 11/28/20 1750   11/28/20 1712  piperacillin-tazobactam (ZOSYN) 3.375 GM/50ML IVPB       Note to Pharmacy: Marchia Meiers   : cabinet override      11/28/20 1712 11/28/20 1735   11/28/20 1545  ceFAZolin (ANCEF) IVPB 2g/100 mL premix        2 g 200 mL/hr over 30 Minutes Intravenous  Once 11/28/20 1538 11/28/20 1630   11/28/20 1539  ceFAZolin (ANCEF) 2-4 GM/100ML-% IVPB       Note to Pharmacy: Minor, Anneita   : cabinet override      11/28/20 1539 11/28/20 1654   11/27/20 1700  [MAR Hold]  cephALEXin (KEFLEX) capsule 500 mg        (MAR Hold since Thu 11/28/2020 at 1534.Hold Reason: Transfer to a Procedural area)   500 mg Oral 3 times daily 11/27/20 1640          Subjective: Seen and examined at bedside and he is not feeling as well continue to have some  abdominal pain and distention.  Passing some gas.  No chest pain or shortness of breath.  Denies any lightheadedness or dizziness.  No other concerns or complaints this time but subsequently later this afternoon he developed likely perforating his bowel and became very tight and rigid in his abdomen General surgery will be taking him to the OR  Objective: Vitals:   11/28/20 0916 11/28/20 1240 11/28/20 1401 11/28/20 1538  BP: 111/63 (!) 108/52 (!) 92/38 (!) 145/95  Pulse: 69 78 77 93  Resp: 18 20 17 18   Temp: 98 F (36.7 C) 97.8 F (36.6 C) (!) 97.5 F (36.4 C) 98.1 F (36.7 C)  TempSrc: Oral Oral Oral Oral  SpO2: 95% 98% 94% 96%  Weight:      Height:        Intake/Output Summary (Last 24 hours) at 11/28/2020 1815 Last data filed at 11/28/2020 1804 Gross per 24 hour  Intake 3588.24 ml  Output 1100 ml  Net 2488.24 ml   Filed Weights   11/27/20 1958 11/28/20 0500  Weight: 83.5 kg 88.4 kg   Examination: Physical Exam:  Constitutional: WN/WD chronically ill-appearing African-American male who is overweight and in no acute distress currently Eyes: Lids and conjunctivae normal, sclerae anicteric  ENMT: External Ears, Nose appear normal. Grossly normal hearing.  Neck: Appears normal, supple, no cervical masses, normal ROM, no appreciable thyromegaly: No JVD Respiratory: Diminished to auscultation bilaterally, no wheezing, rales, rhonchi or crackles. Normal respiratory effort and patient is not tachypenic. No accessory muscle use.  Unlabored breathing Cardiovascular: RRR, no murmurs / rubs / gallops. S1 and S2 auscultated.  Has 1+ lower extremity edema Abdomen: Soft, tender to palpate, distended secondary body habitus. Bowel sounds positive.  GU: Deferred. Musculoskeletal: No clubbing / cyanosis of digits/nails. No joint deformity upper and lower extremities.  Skin: No rashes, lesions, ulcers on limited skin evaluation. No induration; Warm and dry.  Neurologic: CN 2-12 grossly intact  with no focal deficits.  Romberg sign and cerebellar reflexes not assessed.  Psychiatric: Normal judgment and insight. Alert and oriented x 3.  Normal mood and appropriate affect.   Data Reviewed: I have personally reviewed following labs and imaging studies  CBC: Recent Labs  Lab 11/25/20 1554 11/27/20 1035 11/28/20 0045 11/28/20 0806  WBC 30.0* 24.4* 28.1*  --   NEUTROABS 24.0* 21.7*  --   --   HGB 7.5* 6.6* 6.6* 7.8*  HCT 23.9* 21.9* 21.5* 24.7*  MCV 90 96.1 96.0  --   PLT 443 296 275  --    Basic Metabolic Panel: Recent Labs  Lab 11/25/20 1554 11/27/20 1035 11/28/20 0045  NA 143 144 141  K 4.2 3.0* 3.6  CL 106 112* 108  CO2 21 25 25   GLUCOSE 139* 111* 142*  BUN 23 19 13   CREATININE 1.11 0.87 0.82  CALCIUM 9.5 8.8* 8.8*  MG  --  1.5* 2.0   GFR: Estimated Creatinine Clearance: 90.6 mL/min (by C-G formula based on SCr of 0.82 mg/dL). Liver Function Tests: Recent Labs  Lab 11/27/20 1035 11/28/20 0045  AST 33 28  ALT 21 19  ALKPHOS 113 113  BILITOT 0.4 0.4  PROT 6.6 6.4*  ALBUMIN 2.3* 2.1*   Recent Labs  Lab 11/27/20 1035  LIPASE 22   No results for input(s): AMMONIA in the last 168 hours. Coagulation Profile: No results for input(s): INR, PROTIME in the last 168 hours. Cardiac Enzymes: No results for input(s): CKTOTAL, CKMB, CKMBINDEX, TROPONINI in the last 168 hours. BNP (last 3 results) No results for input(s): PROBNP in the last 8760 hours. HbA1C: No results for input(s): HGBA1C in the last 72 hours. CBG: No results for input(s): GLUCAP in the last 168 hours. Lipid Profile: No results for input(s): CHOL, HDL, LDLCALC, TRIG, CHOLHDL, LDLDIRECT in the last 72 hours. Thyroid Function Tests: No results for input(s): TSH, T4TOTAL, FREET4, T3FREE, THYROIDAB in the last 72 hours. Anemia Panel: No results for input(s): VITAMINB12, FOLATE, FERRITIN, TIBC, IRON, RETICCTPCT in the last 72 hours. Sepsis Labs: Recent Labs  Lab 11/27/20 1035   LATICACIDVEN 0.8    Recent Results (from the past 240 hour(s))  Urine Culture     Status: Abnormal   Collection Time: 11/20/20 10:00 AM   Specimen: Urine, Suprapubic  Result Value Ref Range Status   Specimen Description   Final    URINE, SUPRAPUBIC Performed at [facility] [address], [place], [place] [address]    Special Requests   Final    NONE Performed at [facility], [place] [address] [place], [place] [address]    Culture MULTIPLE  SPECIES PRESENT, SUGGEST RECOLLECTION (A)  Final   Report Status 11/21/2020 FINAL  Final  Resp Panel by RT-PCR (Flu A&B, Covid) Nasopharyngeal Swab     Status: None   Collection Time: 11/27/20  1:51 PM   Specimen: Nasopharyngeal Swab; Nasopharyngeal(NP) swabs in vial transport medium  Result Value Ref Range Status   SARS Coronavirus 2 by RT PCR NEGATIVE NEGATIVE Final    Comment: (NOTE) SARS-CoV-2 target nucleic acids are NOT DETECTED.  The SARS-CoV-2 RNA is generally detectable in upper respiratory specimens during the acute phase of infection. The lowest concentration of SARS-CoV-2 viral copies this assay can detect is 138 copies/mL. A negative result does not preclude SARS-Cov-2 infection and should not be used as the sole basis for treatment or other patient management decisions. A negative result may occur with  improper specimen collection/handling, submission of specimen other than nasopharyngeal swab, presence of viral mutation(s) within the areas targeted by this assay, and inadequate number of viral copies(<138 copies/mL). A negative result must be combined with clinical observations, patient history, and epidemiological information. The expected result is Negative.  Fact Sheet for Patients:  EntrepreneurPulse.com.au  Fact Sheet for Healthcare Providers:  IncredibleEmployment.be  This test is no t yet approved or cleared by the [place] FDA and   has been authorized for detection and/or diagnosis of SARS-CoV-2 by FDA under an Emergency Use Authorization (EUA). This EUA will remain  in effect (meaning this test can be used) for the duration of the COVID-19 declaration under Section 564(b)(1) of the Act, 21 U.S.C.section 360bbb-3(b)(1), unless the authorization is terminated  or revoked sooner.       Influenza A by PCR NEGATIVE NEGATIVE Final   Influenza B by PCR NEGATIVE NEGATIVE Final    Comment: (NOTE) The Xpert Xpress SARS-CoV-2/FLU/RSV plus assay is intended as an aid in the diagnosis of influenza from Nasopharyngeal swab specimens and should not be used as a sole basis for treatment. Nasal washings and aspirates are unacceptable for Xpert Xpress SARS-CoV-2/FLU/RSV testing.  Fact Sheet for Patients: EntrepreneurPulse.com.au  Fact Sheet for Healthcare Providers: IncredibleEmployment.be  This test is not yet approved or cleared by the [place] FDA and has been authorized for detection and/or diagnosis of SARS-CoV-2 by FDA under an Emergency Use Authorization (EUA). This EUA will remain in effect (meaning this test can be used) for the duration of the COVID-19 declaration under Section 564(b)(1) of the Act, 21 U.S.C. section 360bbb-3(b)(1), unless the authorization is terminated or revoked.  Performed at [facility], [place] [address] [place], [place] [address]      RN Pressure Injury Documentation:     Estimated body mass index is 29.63 kg/m as calculated from the following:   Height as of this encounter: 5\' 8"  (1.727 m).   Weight as of this encounter: 88.4 kg.  Malnutrition Type:  Nutrition Problem: Increased nutrient needs Etiology: cancer and cancer related treatments  Malnutrition Characteristics:  Signs/Symptoms: estimated needs  Nutrition Interventions:  Interventions: Refer to RD note for recommendations   Radiology Studies: DG Abd 1  View  Result Date: 11/28/2020 CLINICAL DATA:  Abdominal pain, worsening acutely today. EXAM: ABDOMEN - 1 VIEW COMPARISON:  Comparison made with abdominal radiograph from November 28, 2020. FINDINGS: Lucency above the area of the distended sigmoid colon on the supine portable radiograph. Added density with the configuration of the sigmoid colon in the central abdomen compatible with large amount of stool. Bilateral percutaneous nephroureteral stents are in place. Some increasing distension of  small bowel loops in the LEFT abdomen are suggested. On limited assessment there is no acute skeletal process. IMPRESSION: Large amount of stool in the distal colon as described in this patient with known tumor in the pelvis that appears to partially obstruct the colon. Increased lucency over lying the upper margin of stool filled sigmoid colon more likely reflects gas-filled transverse colon. Given findings on prior imaging of colonic obstruction with dilation of the sigmoid would suggest upright and supine abdominal radiographs to exclude the possibility of free air. Slight increase in small bowel distension since previous CT imaging is suggested. This may reflect developing ileus. These results were called by telephone at the time of interpretation on 11/28/2020 at 3:07 pm to provider [facility] , who verbally acknowledged these results. Electronically Signed   By: Zetta Bills M.D.   On: 11/28/2020 15:00   DG Abd 1 View  Result Date: 11/28/2020 CLINICAL DATA:  Constipation EXAM: ABDOMEN - 1 VIEW COMPARISON:  Portable exam 0505 hours compared to 11/10/2020 FINDINGS: Interval clearance of contrast. Significantly increased stool in colon especially sigmoid colon and rectum. Slight gaseous distention of sigmoid loop. Small bowel gas pattern normal. BILATERAL ureteral stent/nephrostomy tubes unchanged. Bones demineralized. IMPRESSION: Significantly increased stool burden. Electronically Signed   By: Lavonia Dana M.D.   On:  11/28/2020 08:20   CT ABDOMEN PELVIS W CONTRAST  Result Date: 11/27/2020 CLINICAL DATA:  Bowel obstruction suspected, history of prostate cancer, ongoing chemotherapy EXAM: CT ABDOMEN AND PELVIS WITH CONTRAST TECHNIQUE: Multidetector CT imaging of the abdomen and pelvis was performed using the standard protocol following bolus administration of intravenous contrast. CONTRAST:  11mL OMNIPAQUE IOHEXOL 350 MG/ML SOLN, additional oral enteric contrast COMPARISON:  CT abdomen pelvis, 11/20/2020 FINDINGS: Lower chest: Trace bilateral pleural effusions and associated atelectasis or consolidation. Hepatobiliary: No solid liver abnormality is seen. No gallstones, gallbladder wall thickening, or biliary dilatation. Pancreas: Unremarkable. No pancreatic ductal dilatation or surrounding inflammatory changes. Spleen: Normal in size without significant abnormality. Adrenals/Urinary Tract: Adrenal glands are unremarkable. Bilateral percutaneous nephrostomy tubes with formed pigtails in the renal pelves. Additional bilateral double-J ureteral stent catheters with formed pigtails in the renal pelves and urinary bladder. The urinary bladder is again severely thickened. Stomach/Bowel: Stomach is within normal limits. Appendix appears normal. There is again noted a large burden of stool throughout the colon and rectum, with very large stool balls in the distended sigmoid colon. The caliber of the colon at this level is 9.9 cm, previously no greater than 6.5 cm (series 4, image 57). Vascular/Lymphatic: Aortic atherosclerosis. Multiple enlarged retroperitoneal, bilateral iliac, and pelvic sidewall lymph nodes are unchanged from recent CT. Reproductive: Gross prostatomegaly. Other: Mild anasarca. Trace ascites. Right eccentric anterior pelvic mass is unchanged compared to recent prior CT (series 2, image 88). Musculoskeletal: Multiple sclerotic osseous metastases, unchanged. IMPRESSION: 1. There is again noted a large burden of stool  throughout the colon and rectum, with very large stool balls in the distended sigmoid colon. The caliber of the colon at this level is 9.9 cm, previously no greater than 6.5 cm. Findings are concerning for fecal impaction, with abnormal transit due to adjacent prostate malignancy. 2. Bilateral percutaneous nephrostomy tubes with formed pigtails in the renal pelves and urinary bladder. Additional bilateral double-J ureteral stent catheters with formed pigtails in the renal pelves and urinary bladder. No hydronephrosis. 3. Findings of advanced metastatic prostate malignancy not significantly changed compared to recent CT. 4. The urinary bladder is again severely thickened, likely related to chronic  outlet obstruction, possibly with superimposed infectious or inflammatory cystitis. 5. Trace bilateral pleural effusions and associated atelectasis or consolidation. 6. Trace ascites and anasarca. Aortic Atherosclerosis (ICD10-I70.0). Electronically Signed   By: Eddie Candle M.D.   On: 11/27/2020 13:58    Scheduled Meds:  [DJS Hold] sodium chloride   Intravenous Once   sodium chloride   Intravenous Once   [MAR Hold] vitamin C  1,000 mg Oral Daily   [MAR Hold] cephALEXin  500 mg Oral TID   [MAR Hold] chlorhexidine  15 mL Mouth Rinse BID   [MAR Hold] Chlorhexidine Gluconate Cloth  6 each Topical Daily   [MAR Hold] feeding supplement  1 Container Oral TID BM   [MAR Hold] hyoscyamine  0.375 mg Oral Once   [MAR Hold] mouth rinse  15 mL Mouth Rinse q12n4p   [MAR Hold] mirtazapine  7.5 mg Oral QHS   [MAR Hold] multivitamin with minerals  1 tablet Oral Daily   [MAR Hold] naloxegol oxalate  12.5 mg Oral Daily   [MAR Hold] Plecanatide  3 mg Oral Daily   [MAR Hold] predniSONE  5 mg Oral Q breakfast   [MAR Hold] pregabalin  50 mg Oral BID   [MAR Hold] senna  1 tablet Oral BID   Continuous Infusions:  sodium chloride 125 mL/hr at 11/27/20 1238   dextrose 5 % and 0.9 % NaCl with KCl 40 mEq/L Stopped (11/28/20 1540)    heparin Stopped (11/28/20 1423)   lactated ringers 75 mL/hr at 11/28/20 1604     LOS: 1 day   Kerney Elbe, DO [facility] PAGER is on AMION  If 7PM-7AM, please contact night-coverage [email or web address]

## 2020-11-28 NOTE — Anesthesia Procedure Notes (Signed)
Arterial Line Insertion Start/End8/25/2022 3:50 PM, 11/28/2020 4:00 PM Performed by: Milford Cage, CRNA, CRNA  Patient location: Pre-op. Preanesthetic checklist: patient identified, IV checked, site marked, risks and benefits discussed, surgical consent, monitors and equipment checked, pre-op evaluation, timeout performed and anesthesia consent Right, radial was placed Catheter size: 20 G Hand hygiene performed , maximum sterile barriers used  and Seldinger technique used Allen's test indicative of satisfactory collateral circulation Attempts: 2 Procedure performed without using ultrasound guided technique. Following insertion, Biopatch and dressing applied. Post procedure assessment: normal and unchanged  Patient tolerated the procedure well with no immediate complications.

## 2020-11-28 NOTE — Significant Event (Signed)
Rapid Response Event Note   Reason for Call :  Pt complaining of sudden onset worsening of abdominal pain. Pt hypotensive and tachycardic.  Initial Focused Assessment:  Pt laying in bed shivering with chills, with severe pain in abdomen described as "gas pain". Abdomen distended and taught. 1L bolus already in progress. Vital signs improved with fluid bolus. HR back down to 80s, SBP up to 130s. KUB completed prior to rapid response arrival and surgery called to evaluate patient.     Interventions:  Surgery to bedside explaining laparotomy, partial colectomy and colostomy to patient and friend. Pt agreeable to surgery.  Plan of Care:  Pt taken to OR may return to 1415 after surgery or to SDU for closer monitoring depending on findings in OR.   Event Summary:   MD Notified: Kae Heller, MD Call Time: [place] Time: 1450 End Time: [place], RN

## 2020-11-28 NOTE — Progress Notes (Addendum)
Pt brother/POA Ashok Norris updated regarding earlier events and that patient is currently in surgery.

## 2020-11-28 NOTE — Anesthesia Preprocedure Evaluation (Addendum)
Anesthesia Evaluation  Patient identified by MRN, date of birth, ID band Patient awake    Reviewed: Allergy & Precautions, NPO status , Patient's Chart, lab work & pertinent test results  History of Anesthesia Complications Negative for: history of anesthetic complications  Airway Mallampati: II  TM Distance: >3 FB Neck ROM: Full    Dental  (+) Dental Advisory Given   Pulmonary neg pulmonary ROS,    breath sounds clear to auscultation       Cardiovascular hypertension, Pt. on medications (-) angina+ DVT   Rhythm:Regular Rate:Normal     Neuro/Psych negative neurological ROS     GI/Hepatic Neg liver ROS, Fecal impaction: now perforated    Endo/Other  negative endocrine ROS  Renal/GU H/o stones   Prostate cancer: metastatic    Musculoskeletal   Abdominal (+)  Abdomen: rigid.    Peds  Hematology  (+) Blood dyscrasia (Hb 7.8), anemia , xarelto   Anesthesia Other Findings   Reproductive/Obstetrics                            Anesthesia Physical Anesthesia Plan  ASA: 4  Anesthesia Plan: General   Post-op Pain Management:    Induction: Intravenous and Rapid sequence  PONV Risk Score and Plan: 2 and Ondansetron and Dexamethasone  Airway Management Planned: Oral ETT  Additional Equipment: Arterial line  Intra-op Plan:   Post-operative Plan: Possible Post-op intubation/ventilation  Informed Consent: I have reviewed the patients History and Physical, chart, labs and discussed the procedure including the risks, benefits and alternatives for the proposed anesthesia with the patient or authorized representative who has indicated his/her understanding and acceptance.     Dental advisory given  Plan Discussed with: CRNA and Surgeon  Anesthesia Plan Comments:        Anesthesia Quick Evaluation

## 2020-11-28 NOTE — Progress Notes (Addendum)
Patient developed acutely worsened abdominal pain and chills around 215 this afternoon, after receiving an enema around 12:30 PM.  Blood pressure borderline, improved after fluid bolus.  Remains afebrile without tachycardia.  On exam he is shivering and clearly very uncomfortable.  Abdomen is distended with diffuse involuntary guarding/rigid consistent with peritonitis.  Suspect perforation related to likely stercoral ulcer.  Unfortunately he will need to go to the operating room for laparotomy, partial colectomy and colostomy.  I discussed with him and his friend who is at the bedside the recommended plan for surgery and risks of bleeding, infection, pain, scarring, poor healing, ongoing constipation issues, persistent abdominal pain, likelihood of ongoing functional decline related to end stages of metastatic and locally advanced prostate cancer.  This operation will be palliative. Without surgery, ongoing severe pain and ultimately death from sepsis will occur.

## 2020-11-28 NOTE — Op Note (Addendum)
Operative Note  Andre Glass  [id number]  [id number]  11/28/2020   Surgeon: Romana Juniper MD FACS   Procedure performed: Exploratory laparotomy, sigmoid colectomy with end colostomy   Preop diagnosis: Perforated viscus Post-op diagnosis/intraop findings: Large perforation in the antimesenteric sigmoid colon which was massively dilated and filled with semisolid stool.  Diffuse feculent peritonitis and extensive contamination of the peritoneal cavity with stool and pus.   Specimens: Sigmoid colon Retained items: 25 French round Blake drain in the pelvis EBL: 50 cc Complications: none   Description of procedure: After obtaining informed consent the patient was taken to the operating room and placed supine on operating room table where general endotracheal anesthesia was initiated, preoperative antibiotics were administered, SCDs applied, and a formal timeout was performed.   Prior to beginning the case, a digital rectal exam was performed and there was no palpable stricture or obstruction as far as I could reach which is approximately 8 cm from the anal verge.  Firmness anteriorly consistent with known prostate cancer.   The abdomen was prepped and draped in usual sterile fashion.  A midline laparotomy was created and the peritoneal cavity was entered with immediate rush of air and feculent peritonitis noted. Upon entering the abdomen (organ space), I encountered feculent peritonitis.  CASE DATA:  Type of patient?: LDOW CASE (Surgical Hospitalist [facility] Inpatient)  Status of Case? EMERGENT Add On  Infection Present At Time Of Surgery (PATOS)?  FECULENT PERITONITIS  The incision was completed and a Balfour retractor placed.  The lateral attachments of the descending colon were mobilized and a massively distended sigmoid was carefully elevated into the field.  There were adhesions in the pelvis to the radiated tissue which were taken down with cautery and blunt dissection.  An area that appeared  viable in the distal descending colon was divided with a blue load 75 mm Endo GIA stapler.  Using the LigaSure, the mesentery to the massively dilated and perforated segment of sigmoid colon was carefully divided progressing distally until the rectum was reached.  This was somewhat boggy and dilated, but was able to be stapled across with a contour green load.  The remaining mesentery was divided with the LigaSure and the sigmoid was handed off.  Of note, there was a large hole in the antimesenteric sigmoid where it made a hairpin turn in the upper abdomen, and this was the original perforation.  During dissection, the very friable and inflamed sigmoid did tear in multiple other places.  The rectal stump was inspected and though intact and appearing viable, given the boggy appearance and dilated nature, I did oversew this with a running imbricating 2-0 Vicryl.  Hemostasis was ensured in the mesentery.   The small bowel was then inspected from the ligament of Treitz to the ileocecal valve and confirmed to be free of injury or any abnormality.  The cecum and ascending colon appeared without injury or perforation.  The transverse and descending colon are somewhat boggy and chronically dilated but appear viable and without perforation.  The NG tube is palpated in the stomach.  The bowel was all returned to the abdomen.  The peritoneal cavity was irrigated with several liters of warm sterile saline evacuating all peritoneal contamination possible.  Ultimately the effluent was clear.   A 19 French round Blake drain was inserted through the right lower quadrant and secured to the skin with a 2-0 nylon.  This is directed down to the pelvis between the rectum and bladder, extending into  the left pelvis and then reflecting back to the right posterior to the rectal stump but not directly along the staple line.  A colostomy site was created in the left upper quadrant and the stapled end of the descending colon was brought  through this and secured with a Babcock.  The fascia was closed with running looped #1 PDS starting at either end and tying centrally.  The colostomy was matured with interrupted 3-0 Vicryl's and then was digitized to confirm patency which is widely patent.  A damp to dry dressing is applied to the midline wound and a colostomy appliance placed.  The drain is placed to bulb suction.  Patient was then awakened, extubated and will be taken to the recovery room.     All counts were correct at the completion of the case.

## 2020-11-28 NOTE — Progress Notes (Signed)
Initial Nutrition Assessment  DOCUMENTATION CODES:  Not applicable  INTERVENTION:  Advance diet as able per surgery to mechanical soft Boost Breeze po TID, each supplement provides 250 kcal and 9 grams of protein Prosource Plus BID to provide 100 kcal and 15g of protein per serving Magic cup BID with meals, each supplement provides 290 kcal and 9 grams of protein Follow-up with physical exam as able  NUTRITION DIAGNOSIS:  Increased nutrient needs related to cancer and cancer related treatments as evidenced by estimated needs.  GOAL:  Patient will meet greater than or equal to 90% of their needs  MONITOR:  PO intake, Supplement acceptance, Labs  REASON FOR ASSESSMENT:  Malnutrition Screening Tool    ASSESSMENT:  70 year old male with a history of metastatic prostate cancer, and obstructive uropathy requiring bilateral nephrostomy tubes, presented to ED from home with worsening right-sided abdominal pain.  In ED, imaging showed a severely dilated colon with significant stool burden concerning for fecal impaction. Surgery consulted and do not plan on surgical intervention at this time, pt being given aggressive bowel regimen including SMOG enema BID. Pt reported to surgery team that he would be willing to have a colostomy placed if recommended.   Noted that pt is actively receiving chemotherapy (Jevtana, first infusion received 11/15/20) and androgen deprivation therapy (Eligard). Previously underwent radiation and 5 cycles of chemo (Taxotere) in 2021.  Pt admitted last month at [facility]. Was followed by RD service at that time and dx with severe malnutrition.  Overall, 1.6% weight loss seen in the last 3 months (5/5-8/25), which is not significant but pt appears to have gained weight since last admission. Some edema present per RN documentation could be masking weight changes.   Attempted to call patient on room phone, however, RN stated that pt is currently in a lot of pain and it  would be better to wait to assess pt's nutrition hx until under better control. Noted that MD did add ensure for pt this AM but that first dose was refused RN states that pt did eat his clear liquid breakfast but refused the ensure. Reported that he liked a different supplement (RN not familiar with brand pt reported). Noted that pt had magic cup and hormel shakes during admission at [facility]. Will follow up with pt's preferences, but in the meantime will add a variety of supplements to augment intake.    Nutritionally Relevant Medications: Scheduled Meds:  vitamin C  1,000 mg Oral Daily   cephALEXin  500 mg Oral TID   feeding supplement  237 mL Oral TID BM   lactulose  20 g Oral TID   mirtazapine  7.5 mg Oral QHS   multivitamin with minerals  1 tablet Oral Daily   naloxegol oxalate  12.5 mg Oral Daily   Plecanatide  3 mg Oral Daily   predniSONE  5 mg Oral Q breakfast   senna  1 tablet Oral BID   sorbitol, milk of mag, mineral oil, glycerin (SMOG) enema  960 mL Rectal BID   Continuous Infusions:  sodium chloride 125 mL/hr at 11/27/20 1238   dextrose 5 % and 0.9 % NaCl with KCl 40 mEq/L 100 mL/hr at 11/28/20 1016   PRN Meds:.acetaminophen, HYDROcodone-acetaminophen, HYDROmorphone (DILAUDID) injection, ondansetron **OR** ondansetron (ZOFRAN) IV, prochlorperazine, sodium chloride flush, tamsulosin  Labs Reviewed  NUTRITION - FOCUSED PHYSICAL EXAM: Defer to in-person assessment. Last physical exam 7/25 indicative of severe malnutrition.   Diet Order:   Diet Order  Diet clear liquid Room service appropriate? Yes; Fluid consistency: Thin  Diet effective now                   EDUCATION NEEDS:  No education needs have been identified at this time  Skin:  Skin Assessment: Reviewed RN Assessment (abraision to the left knee)  Last BM:  8/25 - type 4  Height:  Ht Readings from Last 1 Encounters:  11/27/20 5\' 8"  (1.727 m)   Weight:  Wt Readings from Last 1  Encounters:  11/28/20 88.4 kg   Ideal Body Weight:  70 kg  BMI:  Body mass index is 29.63 kg/m.  Estimated Nutritional Needs:  Kcal:  2200-2400 kcal/d Protein:  110-125 g/d Fluid:  2.2-2.4 L/d  Ranell Patrick, RD, LDN Clinical Dietitian Pager on [place]

## 2020-11-28 NOTE — Anesthesia Procedure Notes (Signed)
Procedure Name: Intubation Date/Time: 11/28/2020 4:22 PM Performed by: Milford Cage, CRNA Pre-anesthesia Checklist: Patient identified, Emergency Drugs available, Suction available and Patient being monitored Patient Re-evaluated:Patient Re-evaluated prior to induction Oxygen Delivery Method: Circle system utilized Preoxygenation: Pre-oxygenation with 100% oxygen Induction Type: IV induction, Rapid sequence and Cricoid Pressure applied Laryngoscope Size: Miller and 2 Grade View: Grade I Tube type: Oral Tube size: 7.5 mm Number of attempts: 1 Airway Equipment and Method: Stylet Placement Confirmation: ETT inserted through vocal cords under direct vision, positive ETCO2 and breath sounds checked- equal and bilateral Secured at: 21 cm Tube secured with: Tape Dental Injury: Teeth and Oropharynx as per pre-operative assessment

## 2020-11-28 NOTE — Progress Notes (Signed)
Subjective: No bowel movements with the current bowel regimen.  Objective: Vital signs in last 24 hours: Temp:  [97.8 F (36.6 C)-99.3 F (37.4 C)] 98.4 F (36.9 C) (08/25 0553) Pulse Rate:  [71-89] 71 (08/25 0553) Resp:  [13-25] 16 (08/25 0553) BP: (107-152)/(52-74) 109/65 (08/25 0553) SpO2:  [96 %-100 %] 96 % (08/25 0553) Weight:  [83.5 kg-88.4 kg] 88.4 kg (08/25 0500) Last BM Date: 11/27/20  Intake/Output from previous day: 08/24 0701 - 08/25 0700 In: 3676.5 [P.O.:240; I.V.:1960.7; Blood:422.5; IV Piggyback:1053.2] Out: 750 [Urine:750] Intake/Output this shift: No intake/output data recorded.  General appearance: alert and no distress GI: tender with mild palpation.  Lab Results: Recent Labs    11/25/20 1554 11/27/20 1035 11/28/20 0045  WBC 30.0* 24.4* 28.1*  HGB 7.5* 6.6* 6.6*  HCT 23.9* 21.9* 21.5*  PLT 443 296 275   BMET Recent Labs    11/25/20 1554 11/27/20 1035 11/28/20 0045  NA 143 144 141  K 4.2 3.0* 3.6  CL 106 112* 108  CO2 21 25 25   GLUCOSE 139* 111* 142*  BUN 23 19 13   CREATININE 1.11 0.87 0.82  CALCIUM 9.5 8.8* 8.8*   LFT Recent Labs    11/28/20 0045  PROT 6.4*  ALBUMIN 2.1*  AST 28  ALT 19  ALKPHOS 113  BILITOT 0.4   PT/INR No results for input(s): LABPROT, INR in the last 72 hours. Hepatitis Panel No results for input(s): HEPBSAG, HCVAB, HEPAIGM, HEPBIGM in the last 72 hours. C-Diff No results for input(s): CDIFFTOX in the last 72 hours. Fecal Lactopherrin No results for input(s): FECLLACTOFRN in the last 72 hours.  Studies/Results: CT ABDOMEN PELVIS W CONTRAST  Result Date: 11/27/2020 CLINICAL DATA:  Bowel obstruction suspected, history of prostate cancer, ongoing chemotherapy EXAM: CT ABDOMEN AND PELVIS WITH CONTRAST TECHNIQUE: Multidetector CT imaging of the abdomen and pelvis was performed using the standard protocol following bolus administration of intravenous contrast. CONTRAST:  45mL OMNIPAQUE IOHEXOL 350 MG/ML SOLN,  additional oral enteric contrast COMPARISON:  CT abdomen pelvis, 11/20/2020 FINDINGS: Lower chest: Trace bilateral pleural effusions and associated atelectasis or consolidation. Hepatobiliary: No solid liver abnormality is seen. No gallstones, gallbladder wall thickening, or biliary dilatation. Pancreas: Unremarkable. No pancreatic ductal dilatation or surrounding inflammatory changes. Spleen: Normal in size without significant abnormality. Adrenals/Urinary Tract: Adrenal glands are unremarkable. Bilateral percutaneous nephrostomy tubes with formed pigtails in the renal pelves. Additional bilateral double-J ureteral stent catheters with formed pigtails in the renal pelves and urinary bladder. The urinary bladder is again severely thickened. Stomach/Bowel: Stomach is within normal limits. Appendix appears normal. There is again noted a large burden of stool throughout the colon and rectum, with very large stool balls in the distended sigmoid colon. The caliber of the colon at this level is 9.9 cm, previously no greater than 6.5 cm (series 4, image 57). Vascular/Lymphatic: Aortic atherosclerosis. Multiple enlarged retroperitoneal, bilateral iliac, and pelvic sidewall lymph nodes are unchanged from recent CT. Reproductive: Gross prostatomegaly. Other: Mild anasarca. Trace ascites. Right eccentric anterior pelvic mass is unchanged compared to recent prior CT (series 2, image 88). Musculoskeletal: Multiple sclerotic osseous metastases, unchanged. IMPRESSION: 1. There is again noted a large burden of stool throughout the colon and rectum, with very large stool balls in the distended sigmoid colon. The caliber of the colon at this level is 9.9 cm, previously no greater than 6.5 cm. Findings are concerning for fecal impaction, with abnormal transit due to adjacent prostate malignancy. 2. Bilateral percutaneous nephrostomy tubes with formed pigtails  in the renal pelves and urinary bladder. Additional bilateral double-J  ureteral stent catheters with formed pigtails in the renal pelves and urinary bladder. No hydronephrosis. 3. Findings of advanced metastatic prostate malignancy not significantly changed compared to recent CT. 4. The urinary bladder is again severely thickened, likely related to chronic outlet obstruction, possibly with superimposed infectious or inflammatory cystitis. 5. Trace bilateral pleural effusions and associated atelectasis or consolidation. 6. Trace ascites and anasarca. Aortic Atherosclerosis (ICD10-I70.0). Electronically Signed   By: Eddie Candle M.D.   On: 11/27/2020 13:58    Medications: Scheduled:  vitamin C  1,000 mg Oral Daily   cephALEXin  500 mg Oral TID   chlorhexidine  15 mL Mouth Rinse BID   Chlorhexidine Gluconate Cloth  6 each Topical Daily   docusate sodium  100 mg Oral BID   feeding supplement  237 mL Oral TID BM   mouth rinse  15 mL Mouth Rinse q12n4p   mirtazapine  7.5 mg Oral QHS   multivitamin with minerals  1 tablet Oral Daily   naloxegol oxalate  12.5 mg Oral Daily   Plecanatide  3 mg Oral Daily   predniSONE  5 mg Oral Q breakfast   pregabalin  50 mg Oral BID   senna  1 tablet Oral BID   Continuous:  sodium chloride 125 mL/hr at 11/27/20 1238   dextrose 5 % and 0.9 % NaCl with KCl 40 mEq/L 100 mL/hr at 11/27/20 2233   heparin 1,400 Units/hr (11/28/20 0242)    Assessment/Plan: 1) Fecal impaction. 2) Metastatic prostate cancer.   The patient typically uses an enema on "all fours" and then he will lie on his left side.  This helps with him passing a bowel movement.  This is acceptable in order to find any maneuver to help him have a bowel movement.  This this is not successful and ostomy may be required.  LOS: 1 day   Naylah Cork D 11/28/2020, 7:14 AM

## 2020-11-28 NOTE — Transfer of Care (Signed)
Immediate Anesthesia Transfer of Care Note  Patient: Andre Glass  Procedure(s) Performed: EXPLORATORY LAPAROTOMY/ SIGMOID COLECTOMY (Abdomen) COLOSTOMY (Abdomen)  Patient Location: PACU  Anesthesia Type:General  Level of Consciousness: drowsy  Airway & Oxygen Therapy: Patient Spontanous Breathing and Patient connected to face mask oxygen  Post-op Assessment: Report given to RN and Post -op Vital signs reviewed and stable  Post vital signs: Reviewed and stable  Last Vitals:  Vitals Value Taken Time  BP 157/63 11/28/20 1847  Temp    Pulse 82 11/28/20 1848  Resp 20 11/28/20 1848  SpO2 100 % 11/28/20 1848  Vitals shown include unvalidated device data.  Last Pain:  Vitals:   11/28/20 1538  TempSrc: Oral  PainSc: 8       Patients Stated Pain Goal: 2 (83/41/96 2229)  Complications: No notable events documented.

## 2020-11-28 NOTE — Progress Notes (Signed)
ANTICOAGULATION CONSULT NOTE   Pharmacy Consult for heparin Indication: recent bilateral DVT on 8/17 (home xarelto on hold)  Allergies  Allergen Reactions   Ibuprofen Anaphylaxis   Shrimp [Shellfish Allergy] Anaphylaxis    Patient Measurements: wt 83kg, height 68 inches Height: 5\' 8"  (172.7 cm) Weight: 88.4 kg (194 lb 14.2 oz) IBW/kg (Calculated) : 68.4 Heparin Dosing Weight: 83 kg  Vital Signs: Temp: 98.4 F (36.9 C) (08/25 0553) Temp Source: Oral (08/25 0553) BP: 109/65 (08/25 0553) Pulse Rate: 71 (08/25 0553)  Labs: Recent Labs    11/25/20 1554 11/27/20 1035 11/27/20 1701 11/28/20 0044 11/28/20 0045 11/28/20 0806  HGB 7.5* 6.6*  --   --  6.6*  --   HCT 23.9* 21.9*  --   --  21.5*  --   PLT 443 296  --   --  275  --   APTT  --   --  34 59*  --  51*  HEPARINUNFRC  --   --  0.91* 0.78*  --  0.46  CREATININE 1.11 0.87  --   --  0.82  --      Estimated Creatinine Clearance: 90.6 mL/min (by C-G formula based on SCr of 0.82 mg/dL).   Medications:  - on xarelto dose pack PTA (on 15mg  bid - last dose taken on 11/26/20 at [facility])  Assessment: Patient is a 70 y.o M with metastatic prostate cancer currently undergoing chemotherapy treatment and on xarelto PTA for bilateral DVTs that was diagnosed on 11/20/20, presented to the ED on 11/27/20 with c/o abdominal pain. Surgery and GI consulted for fecal impaction. Pharmacy has been consulted to transition patient to heparin drip in case invasive intervention is needed with this admission. baseline aPTT 34 sec, heparin level is elevated at 0.91--> is elevated d/t effect of xarelto  Today, 11/28/2020: aPTT 51 sec subtherapeutic on 1400 units/hr Heparin level 0.46 therapeutic but not correlating with aPTT yet Hgb 6.6 - 1 unit PRBCs given, recheck in progress Pt continues to have hematuria as on admission  Goal of Therapy:  Heparin level 0.3-0.7 units/ml aPTT 66-102 seconds Monitor platelets by anticoagulation protocol: Yes    Plan:  - Increase heparin drip conservatively to 1450 units/hr given Hgb - Check 6  hr aPTT and heparin level - Monitor for s/sx bleeding   Peggyann Juba, PharmD, BCPS Pharmacy: [phone] 11/28/2020, 9:01 AM

## 2020-11-28 NOTE — Anesthesia Postprocedure Evaluation (Signed)
Anesthesia Post Note  Patient: Andre Glass  Procedure(s) Performed: EXPLORATORY LAPAROTOMY/ SIGMOID COLECTOMY (Abdomen) COLOSTOMY (Abdomen)     Patient location during evaluation: PACU Anesthesia Type: General Level of consciousness: sedated, patient cooperative and oriented Pain management: pain level controlled (pt states abd pain much better) Vital Signs Assessment: post-procedure vital signs reviewed and stable Respiratory status: spontaneous breathing, nonlabored ventilation, respiratory function stable and patient connected to nasal cannula oxygen Cardiovascular status: blood pressure returned to baseline and stable Postop Assessment: no apparent nausea or vomiting Anesthetic complications: no   No notable events documented.  Last Vitals:  Vitals:   11/28/20 2048 11/28/20 2058  BP:    Pulse: 97 98  Resp: 18 (!) 26  Temp:    SpO2: 100% 100%    Last Pain:  Vitals:   11/28/20 1538  TempSrc: Oral  PainSc: 8                  Brixon Zhen,E. Fawnda Vitullo

## 2020-11-28 NOTE — Progress Notes (Signed)
Palliative care brief note  Palliative care consult received. Chart reviewed and attempted to see patient.  [name] is currently off the floor in surgery.  Palliative will plan f/u tomorrow.  Micheline Rough, MD [facility] Team [phone]  NO CHARGE NOTE

## 2020-11-28 NOTE — Progress Notes (Signed)
Progress Note     Subjective: Abdominal pain significantly improved. Have some stool output with enemas. No nausea or emesis  Objective: Vital signs in last 24 hours: Temp:  [97.8 F (36.6 C)-99.3 F (37.4 C)] 98.4 F (36.9 C) (08/25 0553) Pulse Rate:  [71-89] 71 (08/25 0553) Resp:  [13-25] 16 (08/25 0553) BP: (107-152)/(52-74) 109/65 (08/25 0553) SpO2:  [96 %-100 %] 96 % (08/25 0553) Weight:  [83.5 kg-88.4 kg] 88.4 kg (08/25 0500) Last BM Date: 11/27/20  Intake/Output from previous day: 08/24 0701 - 08/25 0700 In: 3676.5 [P.O.:240; I.V.:1960.7; Blood:422.5; IV Piggyback:1053.2] Out: 750 [Urine:750] Intake/Output this shift: No intake/output data recorded.  PE: General: pleasant, WD, male who is laying in bed uncomfortable appearing HEENT: head is normocephalic, atraumatic. Mouth is pink and moist Lungs: Respiratory effort nonlabored Abd: soft. Distended. No significant TTP this am. No rebound or guarding or concern for peritonitis at this time. +BS MS: all 4 extremities are symmetrical with no cyanosis, clubbing, or edema. No calf TTP bilaterally Skin: warm and dry with no masses, lesions, or rashes Psych: A&Ox3 with an appropriate affect.   Lab Results:  Recent Labs    11/27/20 1035 11/28/20 0045  WBC 24.4* 28.1*  HGB 6.6* 6.6*  HCT 21.9* 21.5*  PLT 296 275   BMET Recent Labs    11/27/20 1035 11/28/20 0045  NA 144 141  K 3.0* 3.6  CL 112* 108  CO2 25 25  GLUCOSE 111* 142*  BUN 19 13  CREATININE 0.87 0.82  CALCIUM 8.8* 8.8*   PT/INR No results for input(s): LABPROT, INR in the last 72 hours. CMP     Component Value Date/Time   NA 141 11/28/2020 0045   NA 143 11/25/2020 1554   K 3.6 11/28/2020 0045   CL 108 11/28/2020 0045   CO2 25 11/28/2020 0045   GLUCOSE 142 (H) 11/28/2020 0045   BUN 13 11/28/2020 0045   BUN 23 11/25/2020 1554   CREATININE 0.82 11/28/2020 0045   CREATININE 1.00 11/14/2020 1146   CALCIUM 8.8 (L) 11/28/2020 0045   PROT  6.4 (L) 11/28/2020 0045   ALBUMIN 2.1 (L) 11/28/2020 0045   AST 28 11/28/2020 0045   AST 35 11/14/2020 1146   ALT 19 11/28/2020 0045   ALT 45 (H) 11/14/2020 1146   ALKPHOS 113 11/28/2020 0045   BILITOT 0.4 11/28/2020 0045   BILITOT 0.3 11/14/2020 1146   GFRNONAA >60 11/28/2020 0045   GFRNONAA >60 11/14/2020 1146   GFRAA >60 11/29/2019 1306   Lipase     Component Value Date/Time   LIPASE 22 11/27/2020 1035       Studies/Results: CT ABDOMEN PELVIS W CONTRAST  Result Date: 11/27/2020 CLINICAL DATA:  Bowel obstruction suspected, history of prostate cancer, ongoing chemotherapy EXAM: CT ABDOMEN AND PELVIS WITH CONTRAST TECHNIQUE: Multidetector CT imaging of the abdomen and pelvis was performed using the standard protocol following bolus administration of intravenous contrast. CONTRAST:  62mL OMNIPAQUE IOHEXOL 350 MG/ML SOLN, additional oral enteric contrast COMPARISON:  CT abdomen pelvis, 11/20/2020 FINDINGS: Lower chest: Trace bilateral pleural effusions and associated atelectasis or consolidation. Hepatobiliary: No solid liver abnormality is seen. No gallstones, gallbladder wall thickening, or biliary dilatation. Pancreas: Unremarkable. No pancreatic ductal dilatation or surrounding inflammatory changes. Spleen: Normal in size without significant abnormality. Adrenals/Urinary Tract: Adrenal glands are unremarkable. Bilateral percutaneous nephrostomy tubes with formed pigtails in the renal pelves. Additional bilateral double-J ureteral stent catheters with formed pigtails in the renal pelves and urinary bladder. The urinary  bladder is again severely thickened. Stomach/Bowel: Stomach is within normal limits. Appendix appears normal. There is again noted a large burden of stool throughout the colon and rectum, with very large stool balls in the distended sigmoid colon. The caliber of the colon at this level is 9.9 cm, previously no greater than 6.5 cm (series 4, image 57). Vascular/Lymphatic:  Aortic atherosclerosis. Multiple enlarged retroperitoneal, bilateral iliac, and pelvic sidewall lymph nodes are unchanged from recent CT. Reproductive: Gross prostatomegaly. Other: Mild anasarca. Trace ascites. Right eccentric anterior pelvic mass is unchanged compared to recent prior CT (series 2, image 88). Musculoskeletal: Multiple sclerotic osseous metastases, unchanged. IMPRESSION: 1. There is again noted a large burden of stool throughout the colon and rectum, with very large stool balls in the distended sigmoid colon. The caliber of the colon at this level is 9.9 cm, previously no greater than 6.5 cm. Findings are concerning for fecal impaction, with abnormal transit due to adjacent prostate malignancy. 2. Bilateral percutaneous nephrostomy tubes with formed pigtails in the renal pelves and urinary bladder. Additional bilateral double-J ureteral stent catheters with formed pigtails in the renal pelves and urinary bladder. No hydronephrosis. 3. Findings of advanced metastatic prostate malignancy not significantly changed compared to recent CT. 4. The urinary bladder is again severely thickened, likely related to chronic outlet obstruction, possibly with superimposed infectious or inflammatory cystitis. 5. Trace bilateral pleural effusions and associated atelectasis or consolidation. 6. Trace ascites and anasarca. Aortic Atherosclerosis (ICD10-I70.0). Electronically Signed   By: Eddie Candle M.D.   On: 11/27/2020 13:58    Anti-infectives: Anti-infectives (From admission, onward)    Start     Dose/Rate Route Frequency Ordered Stop   11/27/20 1700  cephALEXin (KEFLEX) capsule 500 mg        500 mg Oral 3 times daily 11/27/20 1640          Assessment/Plan Fecal impaction/Severe acute on chronic constipation In setting of metastatic prostate cancer - continue bowel regimen with more focus on below than above, and to include daily to bid enemas. GI on consult as well - monitor for signs/symptoms of  perforation on exam - none currently. Xray this am with continued stool burden/dilated colon - continue heparin gtt - clear diet. would not advance beyond this  FEN: clears ID: [id number] VTE: heparin gtt   Metastatic prostate cancer anemia DVT, b/l LE     LOS: 1 day    Winferd Humphrey, [facility] 11/28/2020, 7:36 AM Please see Amion for pager number during day hours 7:00am-4:30pm

## 2020-11-28 NOTE — Progress Notes (Signed)
0277: Arrived to room for AM lab draw, noted blood and fluids infusing via port. Discussed with RN, pt has single port. Noted labs are now scheduled for 0830 to include pTT. Lab will need to be drawn peripherally if heparin is running via port. RN to enter consult if unable to initiate PIV.

## 2020-11-28 NOTE — Progress Notes (Addendum)
[place] for heparin Indication: recent bilateral DVT on 8/17 (home xarelto on hold)  Allergies  Allergen Reactions   Ibuprofen Anaphylaxis   Shrimp [Shellfish Allergy] Anaphylaxis    Patient Measurements: wt 83kg, height 68 inches Height: 5\' 8"  (172.7 cm) Weight: 88.4 kg (194 lb 14.2 oz) IBW/kg (Calculated) : 68.4 Heparin Dosing Weight: 83 kg  Vital Signs: Temp: 98.1 F (36.7 C) (08/25 1538) Temp Source: Oral (08/25 1538) BP: 145/95 (08/25 1538) Pulse Rate: 93 (08/25 1538)  Labs: Recent Labs    11/27/20 1035 11/27/20 1701 11/28/20 0044 11/28/20 0045 11/28/20 0806  HGB 6.6*  --   --  6.6* 7.8*  HCT 21.9*  --   --  21.5* 24.7*  PLT 296  --   --  275  --   APTT  --  34 59*  --  51*  HEPARINUNFRC  --  0.91* 0.78*  --  0.46  CREATININE 0.87  --   --  0.82  --      Estimated Creatinine Clearance: 90.6 mL/min (by C-G formula based on SCr of 0.82 mg/dL).   Medications:  - on xarelto dose pack PTA (on 15mg  bid - last dose taken on 11/26/20 at [facility])  Assessment: Patient is a 70 y.o M with metastatic prostate cancer currently undergoing chemotherapy treatment and on xarelto PTA for bilateral DVTs that was diagnosed on 11/20/20, presented to the ED on 11/27/20 with c/o abdominal pain. Surgery and GI consulted for fecal impaction. Pharmacy has been consulted to transition patient to heparin drip in case invasive intervention is needed with this admission. baseline aPTT 34 sec, heparin level is elevated at 0.91--> is elevated d/t effect of xarelto  Today, 11/28/2020: See RN note from 1345 today; pt found to have new acute severe abd pain, hypotension and diaphoresis; heparin turned off at 1423 and Pt taken urgently to OR d/t concern for large bowel perforation in setting of fecal impaction  Goal of Therapy:  Heparin level 0.3-0.7 units/ml aPTT 66-102 seconds Monitor platelets by anticoagulation protocol: Yes   Plan:  Expect pt will  remain off heparin until further notice from Surgery team Pharmacy will follow peripherally for ability to resume heparin vs other anticoag   Reuel Boom, PharmD, BCPS [phone] 11/28/2020, 6:29 PM  PM ADDENDUM - Pt s/p ex lap with sigmoid colectomy and end colostomy placement - Per Surgery, may resume heparin after 0130 tomorrow AM - Resume heparin at 1450 units/hr starting 0200 tomorrow AM - Check 8 hr heparin level/aPTT   Reuel Boom, PharmD, BCPS [phone] 11/28/2020, 9:25 PM

## 2020-11-29 ENCOUNTER — Encounter (HOSPITAL_COMMUNITY): Payer: Self-pay | Admitting: Surgery

## 2020-11-29 ENCOUNTER — Inpatient Hospital Stay (HOSPITAL_COMMUNITY): Payer: Medicare Other

## 2020-11-29 DIAGNOSIS — I82412 Acute embolism and thrombosis of left femoral vein: Secondary | ICD-10-CM

## 2020-11-29 DIAGNOSIS — Z192 Hormone resistant malignancy status: Secondary | ICD-10-CM

## 2020-11-29 DIAGNOSIS — Z7189 Other specified counseling: Secondary | ICD-10-CM

## 2020-11-29 DIAGNOSIS — C61 Malignant neoplasm of prostate: Secondary | ICD-10-CM

## 2020-11-29 DIAGNOSIS — Z515 Encounter for palliative care: Secondary | ICD-10-CM

## 2020-11-29 LAB — PHOSPHORUS: Phosphorus: 4.1 mg/dL (ref 2.5–4.6)

## 2020-11-29 LAB — COMPREHENSIVE METABOLIC PANEL
ALT: 14 U/L (ref 0–44)
AST: 27 U/L (ref 15–41)
Albumin: 2.3 g/dL — ABNORMAL LOW (ref 3.5–5.0)
Alkaline Phosphatase: 71 U/L (ref 38–126)
Anion gap: 8 (ref 5–15)
BUN: 17 mg/dL (ref 8–23)
CO2: 21 mmol/L — ABNORMAL LOW (ref 22–32)
Calcium: 8 mg/dL — ABNORMAL LOW (ref 8.9–10.3)
Chloride: 114 mmol/L — ABNORMAL HIGH (ref 98–111)
Creatinine, Ser: 1.16 mg/dL (ref 0.61–1.24)
GFR, Estimated: >60 mL/min (ref >60)
Glucose, Bld: 166 mg/dL — ABNORMAL HIGH (ref 70–99)
Potassium: 4.5 mmol/L (ref 3.5–5.1)
Sodium: 143 mmol/L (ref 135–145)
Total Bilirubin: 1 mg/dL (ref 0.3–1.2)
Total Protein: 5.2 g/dL — ABNORMAL LOW (ref 6.5–8.1)

## 2020-11-29 LAB — CBC WITH DIFFERENTIAL/PLATELET
Abs Immature Granulocytes: 0.1 10*3/uL — ABNORMAL HIGH (ref 0.00–0.07)
Band Neutrophils: 4 %
Basophils Absolute: 0 10*3/uL (ref 0.0–0.1)
Basophils Relative: 0 %
Eosinophils Absolute: 0 10*3/uL (ref 0.0–0.5)
Eosinophils Relative: 0 %
HCT: 35.2 % — ABNORMAL LOW (ref 39.0–52.0)
Hemoglobin: 11.1 g/dL — ABNORMAL LOW (ref 13.0–17.0)
Lymphocytes Relative: 5 %
Lymphs Abs: 0.6 10*3/uL — ABNORMAL LOW (ref 0.7–4.0)
MCH: 29.4 pg (ref 26.0–34.0)
MCHC: 31.5 g/dL (ref 30.0–36.0)
MCV: 93.4 fL (ref 80.0–100.0)
Monocytes Absolute: 0.3 10*3/uL (ref 0.1–1.0)
Monocytes Relative: 3 %
Myelocytes: 1 %
Neutro Abs: 10.3 10*3/uL — ABNORMAL HIGH (ref 1.7–7.7)
Neutrophils Relative %: 87 %
Platelets: 213 10*3/uL (ref 150–400)
RBC: 3.77 MIL/uL — ABNORMAL LOW (ref 4.22–5.81)
RDW: 17.8 % — ABNORMAL HIGH (ref 11.5–15.5)
WBC: 11.3 10*3/uL — ABNORMAL HIGH (ref 4.0–10.5)
nRBC: 0.7 % — ABNORMAL HIGH (ref 0.0–0.2)

## 2020-11-29 LAB — URINE CULTURE
Culture: NO GROWTH
Culture: NO GROWTH

## 2020-11-29 LAB — APTT: aPTT: 27 seconds (ref 24–36)

## 2020-11-29 LAB — HEPARIN LEVEL (UNFRACTIONATED): Heparin Unfractionated: 0.47 IU/mL (ref 0.30–0.70)

## 2020-11-29 LAB — MAGNESIUM: Magnesium: 2.1 mg/dL (ref 1.7–2.4)

## 2020-11-29 MED ORDER — FAMOTIDINE IN NACL 20-0.9 MG/50ML-% IV SOLN
20.0000 mg | Freq: Once | INTRAVENOUS | Status: AC
  Administered 2020-11-29: 20 mg via INTRAVENOUS
  Filled 2020-11-29: qty 50

## 2020-11-29 NOTE — Progress Notes (Signed)
PROGRESS NOTE    Andre Glass  QVZ:[id number] DOB: [birth year] DOA: 11/27/2020 PCP: [name], DO  Brief Narrative:  The patient is a 70 year old African-American male with a past medical history significant for but not limited to metastatic prostate cancer with resultant obstructive uropathy requiring bilateral nephrostomy tube placement with other comorbidities who presented with worsening right-sided abdominal pain that started on the morning of yesterday.  The pain was found to be in the right lower quadrant and was a sharp 10 out of 10 in severity.  Patient dates that he passed gas yesterday and had several loose bowel movements but states nothing made the pain worse or better.  He recently had a CT scan on 11/20/2020 which showed possible malignant stricture was progressive and intra-abdominal metastatic disease and extensive osseous metastatic disease as well as evidence of bilateral lower extremity DVT and pelvic veins being compressed by pelvic adenopathy.  He had right peroneal DVT and acute left femoral DVT extending through the veins of the left lower leg and vascular surgery was consulted at that time and recommended no intervention discharged on anticoagulation with Xarelto.  He is currently on amoxicillin and Trulance without relief.  He was given fleets enema without relief in the ED.  He was also noted to have a hemoglobin of 6.6 down from 7.5 a few days ago.  He was transfused 1 unit PRBCs and general surgery as well as gastroenterology were consulted and they recommended bowel regimen and no surgical intervention unless the patient did not improve however subsequently after his enema today the patien likely perforated secondary to history of colon ulcer and had rigidity and guarding consistent with peritonitis.  General surgery is taking him to the OR for emergent surgery for a laparotomy, partial colectomy and colostomy and he is postoperative day 1 for exploratory laparotomy, sigmoid  colectomy and end colostomy with Dr. Yancey Flemings on 11/28/2020.  Findings intraoperatively showed a large perforation with diffuse feculent peritonitis and extensive contamination of the peritoneal cavity with stool and pus  General surgery feels that he is at very high risk for abscess formation and they are recommending following his labs closely and consider repeating a scan in next few days.  They are recommending continuing NG tube to low intermittent suction and await viable bowel function.  He does have some colostomy output.  Wound care has been consulted and he has been getting wet-to-dry dressings twice daily and pain control with IV acetaminophen.   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 Windle Guard on 11/28/2020 laparotomies/ -Patient presents with a 3-day history of fecal impaction secondary to prostate cancer along with his home Dilaudid -He has tried senna and MiraLAX in the past with out relief -Patient takes Movantik and flecainide and he was given Colace and senna trial of tapwater enemas as well as lactulose enemas -GI was consulted and recommended enemas the way he takes them -After his enema today he became extremely hypotensive and had some guarding and rigidity of his abdomen and became diaphoretic -Stat KUB showed possible free air -General surgery was reconsulted and took the patient  to the OR emergently -He is postoperative day 1 and he is at very high risk for abscess formation and general surgery recommends continue to follow his labs and exam  closely and consider repeating a CT scan in next few days -Wound care has been consulted -He is getting IV fluid hydration with normal saline 100 MLS per hour -General surgery recommending continue NG tube to low intermittent suction until he has reliable bowel function as they feel an ileus is expected -General surgery thinks his ostomy is tenuous but is working they are recommending continue IV antibiotics with IV Zosyn and JP drain for now -Palliative care has been consulted for goals of care discussion  Prostate cancer with subsequent pain -Continue with Tylenol, hydrocodone and Dilaudid -General surgery started him on IV acetaminophen  History of DVT -Continue with heparin drip  Acute on chronic anemia likely of chronic disease -Patient's hemoglobin/hematocrit dropped to 6.6/21.5 and he was typed and screened and transfused 1 unit of PRBCs; after 1 unit of PRBCs hemoglobin/hematocrit improved to 7.8/24.7 yesterday and he was also transfused in the OR; hemoglobin/hematocrit is now trended up to 11.1/35.2 -He remains on a heparin drip and will need to monitor for signs and symptoms of bleeding -Continue monitor and trend repeat CBC in a.m.  Hematuria -In the setting of being on anticoagulation -Continue to monitor for gross hematuria and may need urology consultation  Hypokalemia -Improved and resolved as potassium is now 4.5 - Continue monitor and replete as necessary  Hypomagnesemia -Improved and resolved as magnesium was 2.1 -Continue monitor and replete as necessary  Leukocytosis -In the setting of fecal impaction and perforation now -WBC trended up from 24.4 is now 28.1 yesterday but then trended down to 5.9 is now trending back up to 11.3 in setting of his surgery -Currently getting cefazolin for surgical prophylaxis and was also on p.o. Keflex 500 mg 3 times daily; also received a dose of IV Zosyn  DVT prophylaxis: Anticoagulated with a heparin drip Code Status: Full  code Family Communication: No family present at bedside Disposition Plan: Pending further clinical improvement and clearance by specialists  Status is: Inpatient  Remains inpatient appropriate because:Unsafe d/c plan, IV treatments appropriate due to intensity of illness or inability to take PO, and Inpatient level of care appropriate due to severity of illness  Dispo: The patient is from: Home              Anticipated d/c is to:  TBD              Patient currently is not medically stable to d/c.   Difficult to place patient No  Consultants:  General surgery Gastroenterology Palliative care medicine  Procedures: Emergent surgery: Procedure performed: Exploratory laparotomy, sigmoid colectomy with end colostomy  Antimicrobials: Anti-infectives (From admission, onward)    Start     Dose/Rate Route Frequency Ordered Stop   11/28/20 2230  piperacillin-tazobactam (ZOSYN) IVPB 3.375 g        3.375 g 12.5 mL/hr over 240 Minutes Intravenous Every 8 hours 11/28/20 2121     11/28/20 2215  piperacillin-tazobactam (ZOSYN) IVPB 3.375 g  Status:  Discontinued       Note to Pharmacy: Given Intraoperative   3.375 g 100 mL/hr over 30 Minutes Intravenous Every 8 hours 11/28/20 2117 11/28/20 2121   11/28/20 1730  piperacillin-tazobactam (ZOSYN) IVPB 3.375 g       Note to Pharmacy: Given Intraoperative   3.375 g 100 mL/hr over 30 Minutes Intravenous  Once 11/28/20 1729 11/28/20 1750   11/28/20 1712  piperacillin-tazobactam (ZOSYN) 3.375 GM/50ML IVPB       Note to Pharmacy: Marchia Meiers   : cabinet override      11/28/20 1712 11/28/20 1735   11/28/20 1545  ceFAZolin (ANCEF) IVPB 2g/100 mL premix        2 g 200 mL/hr over 30 Minutes Intravenous  Once 11/28/20 1538 11/28/20 1630   11/28/20 1539  ceFAZolin (ANCEF) 2-4 GM/100ML-% IVPB       Note to Pharmacy: Minor, Anneita   : cabinet override      11/28/20 1539 11/28/20 1654   11/27/20 1700  cephALEXin (KEFLEX) capsule 500 mg  Status:   Discontinued        500 mg Oral 3 times daily 11/27/20 1640 11/28/20 2117        Subjective: Seen and examined at bedside and he is feeling sore after surgical procedure.  Has a NG tube in him and appears uncomfortable.  States that he feels okay.  No other concerns or complaints this time but does feel rough  Objective: Vitals:   11/29/20 0500 11/29/20 0549 11/29/20 1043 11/29/20 1324  BP:  96/66 111/65 98/67  Pulse:  90 96 (!) 102  Resp:  20 18 18   Temp:  97.6 F (36.4 C) 97.8 F (36.6 C) (!) 97.3 F (36.3 C)  TempSrc:  Oral Oral Oral  SpO2:  98% 97% 98%  Weight: 87.1 kg     Height:        Intake/Output Summary (Last 24 hours) at 11/29/2020 1818 Last data filed at 11/29/2020 1600 Gross per 24 hour  Intake 3392.36 ml  Output 990 ml  Net 2402.36 ml    Filed Weights   11/27/20 1958 11/28/20 0500 11/29/20 0500  Weight: 83.5 kg 88.4 kg 87.1 kg   Examination: Physical Exam:  Constitutional: WN/WD chronically ill appearing AAM appears a little uncomfortable Eyes: Lids and conjunctivae normal, sclerae anicteric  ENMT: External Ears, Nose appear normal. Grossly normal hearing. NG tube in place Neck: Appears normal, supple, no cervical masses, normal ROM, no appreciable thyromegaly; no JVD Respiratory: Diminished to auscultation bilaterally, no wheezing, rales, rhonchi or crackles. Normal respiratory effort and patient is not tachypenic. No accessory muscle use. Unlabored breathing  Cardiovascular: RRR, no murmurs / rubs / gallops. S1 and S2 auscultated. No extremity edema. 2+ pedal pulses. No carotid bruits.  Abdomen: Soft, Tender, Distended and has a colostomy and a JP Drain. Bowel sounds positive.  GU: Deferred. Has bilateral Nephrostomy tubes Musculoskeletal: No clubbing / cyanosis of digits/nails. Normal strength and muscle tone.  Skin: No rashes, lesions, ulcers. No induration; Warm and dry.  Neurologic: CN 2-12 grossly intact with no focal deficits. Romberg sign and  cerebellar reflexes not assessed.  Psychiatric: Normal judgment and insight. Alert and oriented x 3. Normal mood and appropriate affect.    Data Reviewed: I have personally reviewed following labs and imaging studies  CBC: Recent Labs  Lab 11/25/20 1554 11/27/20 1035 11/27/20 1035 11/28/20 0045 11/28/20 0806 11/28/20 1734 11/28/20 1812 11/28/20 2204 11/29/20 0352  WBC 30.0* 24.4*  --  28.1*  --   --   --  5.9 11.3*  NEUTROABS 24.0* 21.7*  --   --   --   --   --   --  10.3*  HGB 7.5* 6.6*   < > 6.6* 7.8* 11.2* 9.9* 10.4* 11.1*  HCT 23.9* 21.9*   < > 21.5* 24.7* 33.0* 29.0* 33.1*  35.2*  MCV 90 96.1  --  96.0  --   --   --  93.2 93.4  PLT 443 296  --  275  --   --   --  194 213   < > = values in this interval not displayed.    Basic Metabolic Panel: Recent Labs  Lab 11/25/20 1554 11/27/20 1035 11/28/20 0045 11/28/20 1734 11/28/20 1812 11/29/20 0352  NA 143 144 141 142 143 143  K 4.2 3.0* 3.6 4.4 3.7 4.5  CL 106 112* 108  --   --  114*  CO2 21 25 25   --   --  21*  GLUCOSE 139* 111* 142*  --   --  166*  BUN 23 19 13   --   --  17  CREATININE 1.11 0.87 0.82  --   --  1.16  CALCIUM 9.5 8.8* 8.8*  --   --  8.0*  MG  --  1.5* 2.0  --   --  2.1  PHOS  --   --   --   --   --  4.1    GFR: Estimated Creatinine Clearance: 63.6 mL/min (by C-G formula based on SCr of 1.16 mg/dL). Liver Function Tests: Recent Labs  Lab 11/27/20 1035 11/28/20 0045 11/29/20 0352  AST 33 28 27  ALT 21 19 14   ALKPHOS 113 113 71  BILITOT 0.4 0.4 1.0  PROT 6.6 6.4* 5.2*  ALBUMIN 2.3* 2.1* 2.3*    Recent Labs  Lab 11/27/20 1035  LIPASE 22    No results for input(s): AMMONIA in the last 168 hours. Coagulation Profile: No results for input(s): INR, PROTIME in the last 168 hours. Cardiac Enzymes: No results for input(s): CKTOTAL, CKMB, CKMBINDEX, TROPONINI in the last 168 hours. BNP (last 3 results) No results for input(s): PROBNP in the last 8760 hours. HbA1C: No results for  input(s): HGBA1C in the last 72 hours. CBG: No results for input(s): GLUCAP in the last 168 hours. Lipid Profile: No results for input(s): CHOL, HDL, LDLCALC, TRIG, CHOLHDL, LDLDIRECT in the last 72 hours. Thyroid Function Tests: No results for input(s): TSH, T4TOTAL, FREET4, T3FREE, THYROIDAB in the last 72 hours. Anemia Panel: No results for input(s): VITAMINB12, FOLATE, FERRITIN, TIBC, IRON, RETICCTPCT in the last 72 hours. Sepsis Labs: Recent Labs  Lab 11/27/20 1035  LATICACIDVEN 0.8     Recent Results (from the past 240 hour(s))  Urine Culture     Status: Abnormal   Collection Time: 11/20/20 10:00 AM   Specimen: Urine, Suprapubic  Result Value Ref Range Status   Specimen Description   Final    URINE, SUPRAPUBIC Performed at [facility] [address], [place], [place] [address]    Special Requests   Final    NONE Performed at [facility], [place] [address] [place], [place] [address]    Culture MULTIPLE SPECIES PRESENT, SUGGEST RECOLLECTION (A)  Final   Report Status 11/21/2020 FINAL  Final  Resp Panel by RT-PCR (Flu A&B, Covid) Nasopharyngeal Swab     Status: None   Collection Time: 11/27/20  1:51 PM   Specimen: Nasopharyngeal Swab; Nasopharyngeal(NP) swabs in vial transport medium  Result Value Ref Range Status   SARS Coronavirus 2 by RT PCR NEGATIVE NEGATIVE Final    Comment: (NOTE) SARS-CoV-2 target nucleic acids are NOT DETECTED.  The SARS-CoV-2 RNA is generally detectable in upper respiratory specimens during the acute phase of infection. The lowest concentration of SARS-CoV-2 viral  copies this assay can detect is 138 copies/mL. A negative result does not preclude SARS-Cov-2 infection and should not be used as the sole basis for treatment or other patient management decisions. A negative result may occur with  improper specimen collection/handling, submission of specimen other than nasopharyngeal swab, presence of viral  mutation(s) within the areas targeted by this assay, and inadequate number of viral copies(<138 copies/mL). A negative result must be combined with clinical observations, patient history, and epidemiological information. The expected result is Negative.  Fact Sheet for Patients:  EntrepreneurPulse.com.au  Fact Sheet for Healthcare Providers:  IncredibleEmployment.be  This test is no t yet approved or cleared by the [place] FDA and  has been authorized for detection and/or diagnosis of SARS-CoV-2 by FDA under an Emergency Use Authorization (EUA). This EUA will remain  in effect (meaning this test can be used) for the duration of the COVID-19 declaration under Section 564(b)(1) of the Act, 21 U.S.C.section 360bbb-3(b)(1), unless the authorization is terminated  or revoked sooner.       Influenza A by PCR NEGATIVE NEGATIVE Final   Influenza B by PCR NEGATIVE NEGATIVE Final    Comment: (NOTE) The Xpert Xpress SARS-CoV-2/FLU/RSV plus assay is intended as an aid in the diagnosis of influenza from Nasopharyngeal swab specimens and should not be used as a sole basis for treatment. Nasal washings and aspirates are unacceptable for Xpert Xpress SARS-CoV-2/FLU/RSV testing.  Fact Sheet for Patients: EntrepreneurPulse.com.au  Fact Sheet for Healthcare Providers: IncredibleEmployment.be  This test is not yet approved or cleared by the [place] FDA and has been authorized for detection and/or diagnosis of SARS-CoV-2 by FDA under an Emergency Use Authorization (EUA). This EUA will remain in effect (meaning this test can be used) for the duration of the COVID-19 declaration under Section 564(b)(1) of the Act, 21 U.S.C. section 360bbb-3(b)(1), unless the authorization is terminated or revoked.  Performed at [facility], [place] [address], [place], [place] [address]   Urine Culture      Status: None   Collection Time: 11/27/20  3:25 PM   Specimen: Kidney; Urine  Result Value Ref Range Status   Specimen Description   Final    KIDNEY LEFT NEPHROSTOMY Performed at [facility] [address] [name], [place] [address]    Special Requests   Final    NONE Performed at [facility], [place] [address], [place], [place] [address]    Culture   Final    NO GROWTH Performed at [facility], [place] [address], [place], [place] [address]    Report Status 11/29/2020 FINAL  Final  Urine Culture     Status: None   Collection Time: 11/27/20  3:25 PM   Specimen: Kidney; Urine  Result Value Ref Range Status   Specimen Description   Final    KIDNEY RIGHT NEPHROSTOMY Performed at [facility] [address], [place], [place] [address]    Special Requests   Final    NONE Performed at [facility], [place] [address], [place], [place] [address]    Culture   Final    NO GROWTH Performed at [facility], [place] [address] [place],  [address]    Report Status 11/29/2020 FINAL  Final      RN Pressure Injury Documentation:     Estimated body mass index is 29.2 kg/m as calculated from the following:   Height as of this encounter: 5\' 8"  (  1.727 m).   Weight as of this encounter: 87.1 kg.  Malnutrition Type:  Nutrition Problem: Increased nutrient needs Etiology: cancer and cancer related treatments  Malnutrition Characteristics:  Signs/Symptoms: estimated needs  Nutrition Interventions:  Interventions: Refer to RD note for recommendations   Radiology Studies: DG Abd 1 View  Result Date: 11/29/2020 CLINICAL DATA:  Check gastric catheter placement EXAM: ABDOMEN - 1 VIEW COMPARISON:  Film from earlier in the same day. FINDINGS: Nephroureteral stents are noted bilaterally. Gastric catheter is now noted within the stomach. No obstructive changes are seen. No free air is noted. IMPRESSION:  Gastric catheter within the stomach. Electronically Signed   By: Inez Catalina M.D.   On: 11/29/2020 03:54   DG Abd 1 View  Result Date: 11/28/2020 CLINICAL DATA:  Abdominal pain, worsening acutely today. EXAM: ABDOMEN - 1 VIEW COMPARISON:  Comparison made with abdominal radiograph from November 28, 2020. FINDINGS: Lucency above the area of the distended sigmoid colon on the supine portable radiograph. Added density with the configuration of the sigmoid colon in the central abdomen compatible with large amount of stool. Bilateral percutaneous nephroureteral stents are in place. Some increasing distension of small bowel loops in the LEFT abdomen are suggested. On limited assessment there is no acute skeletal process. IMPRESSION: Large amount of stool in the distal colon as described in this patient with known tumor in the pelvis that appears to partially obstruct the colon. Increased lucency over lying the upper margin of stool filled sigmoid colon more likely reflects gas-filled transverse colon. Given findings on prior imaging of colonic obstruction with dilation of the sigmoid would suggest upright and supine abdominal radiographs to exclude the possibility of free air. Slight increase in small bowel distension since previous CT imaging is suggested. This may reflect developing ileus. These results were called by telephone at the time of interpretation on 11/28/2020 at 3:07 pm to provider [facility] , who verbally acknowledged these results. Electronically Signed   By: Zetta Bills M.D.   On: 11/28/2020 15:00   DG Abd 1 View  Result Date: 11/28/2020 CLINICAL DATA:  Constipation EXAM: ABDOMEN - 1 VIEW COMPARISON:  Portable exam 0505 hours compared to 11/10/2020 FINDINGS: Interval clearance of contrast. Significantly increased stool in colon especially sigmoid colon and rectum. Slight gaseous distention of sigmoid loop. Small bowel gas pattern normal. BILATERAL ureteral stent/nephrostomy tubes unchanged.  Bones demineralized. IMPRESSION: Significantly increased stool burden. Electronically Signed   By: Lavonia Dana M.D.   On: 11/28/2020 08:20    Scheduled Meds:  vitamin C  1,000 mg Oral Daily   chlorhexidine  15 mL Mouth Rinse BID   Chlorhexidine Gluconate Cloth  6 each Topical Daily   mouth rinse  15 mL Mouth Rinse q12n4p   mirtazapine  7.5 mg Oral QHS   multivitamin with minerals  1 tablet Oral Daily   naloxegol oxalate  12.5 mg Oral Daily   Plecanatide  3 mg Oral Daily   predniSONE  5 mg Oral Q breakfast   pregabalin  50 mg Oral BID   Continuous Infusions:  acetaminophen 1,000 mg (11/29/20 1244)   dextrose 5 % and 0.9 % NaCl with KCl 40 mEq/L 100 mL/hr at 11/29/20 1045   heparin 1,450 Units/hr (11/29/20 0440)   piperacillin-tazobactam (ZOSYN)  IV 3.375 g (11/29/20 1608)     LOS: 2 days   Kerney Elbe, DO [facility] PAGER is on AMION  If 7PM-7AM, please contact night-coverage [email or web address]

## 2020-11-29 NOTE — Progress Notes (Signed)
Was paged to the room to pray and provide spiritual support to patient before procedure.  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  Provided prayer and presence as well as support to his family who was present with him.  [place], Bcc Pager, [phone] 10:45 AM

## 2020-11-29 NOTE — TOC Progression Note (Signed)
Transition of Care [facility]) - Progression Note    Patient Details  Name: Andre Glass MRN: [id number] Date of Birth: 1950-11-16  Transition of Care [facility]) CM/SW Contact  [place], [place], [place] Phone Number: 11/29/2020, 9:39 AM  Clinical Narrative:     CSW informed that [name] was open to patient however they can not meet his needs anymore.  Patient will need new [place] agency for discharge.   Expected Discharge Plan: [place] Barriers to Discharge: Continued Medical Work up  Expected Discharge Plan and Services Expected Discharge Plan: [place]   Discharge Planning Services: CM Consult   Living arrangements for the past 2 months: Single Family Home                                       Social Determinants of Health (SDOH) Interventions    Readmission Risk Interventions Readmission Risk Prevention Plan 10/02/2020  Transportation Screening Complete  PCP or Specialist Appt within 5-7 Days Complete  Home Care Screening Complete  Medication Review (RN CM) Complete  Some recent data might be hidden

## 2020-11-29 NOTE — Care Management Important Message (Signed)
Medicare IM printed for [facility] to give to the patient. 

## 2020-11-29 NOTE — Progress Notes (Addendum)
Went to room to check up on patient and to start IV heparin. Patient had just pulled out his NG tube that was on LWIS. At this time, there was less than 50 ml output. Notified X. Blount. She stated to please replace the NG tube b/c surgery inserted the NG Tube. Will replace. Will continue to monitor.   Replaced NG tube and confirmed placement by KUB before beginning LWIS. Will continue to monitor.

## 2020-11-29 NOTE — Progress Notes (Signed)
ANTICOAGULATION CONSULT NOTE   Pharmacy Consult for heparin Indication: recent bilateral DVT on 8/17 (home xarelto on hold)  Allergies  Allergen Reactions   Ibuprofen Anaphylaxis   Shrimp [Shellfish Allergy] Anaphylaxis    Patient Measurements: wt 83kg, height 68 inches Height: 5\' 8"  (172.7 cm) Weight: 87.1 kg (192 lb 0.3 oz) IBW/kg (Calculated) : 68.4 Heparin Dosing Weight: 83 kg  Vital Signs: Temp: 97.8 F (36.6 C) (08/26 1043) Temp Source: Oral (08/26 1043) BP: 111/65 (08/26 1043) Pulse Rate: 96 (08/26 1043)  Labs: Recent Labs    11/27/20 1035 11/27/20 1701 11/28/20 0044 11/28/20 0045 11/28/20 0806 11/28/20 1734 11/28/20 1812 11/28/20 2204 11/29/20 0352 11/29/20 1020  HGB 6.6*  --   --  6.6* 7.8*   < > 9.9* 10.4* 11.1*  --   HCT 21.9*  --   --  21.5* 24.7*   < > 29.0* 33.1* 35.2*  --   PLT 296  --   --  275  --   --   --  194 213  --   APTT  --    < > 59*  --  51*  --   --   --   --  27  HEPARINUNFRC  --    < > 0.78*  --  0.46  --   --   --   --  0.47  CREATININE 0.87  --   --  0.82  --   --   --   --  1.16  --    < > = values in this interval not displayed.     Estimated Creatinine Clearance: 63.6 mL/min (by C-G formula based on SCr of 1.16 mg/dL).   Medications:  - on xarelto dose pack PTA (on 15mg  bid - last dose taken on 11/26/20 at [facility])  Assessment: Patient is a 70 y.o M with metastatic prostate cancer currently undergoing chemotherapy treatment and on xarelto PTA for bilateral DVTs that was diagnosed on 11/20/20, presented to the ED on 11/27/20 with c/o abdominal pain. Surgery and GI consulted for fecal impaction. Pharmacy has been consulted to transition patient to heparin drip in case invasive intervention is needed with this admission. Baseline aPTT 34 sec, heparin level is elevated at 0.91--> is elevated d/t effect of xarelto  Patient taken to OR on 8/25 for sigmoid colectomy and end colostomy placement due to concern for large bowel perforation in  setting of fecal impaction. Heparin was held and resumed 8/26 @ 0200.  Today, 11/29/2020: Heparin level therapeutic at 0.46, aPTT 26 seconds which is close to patient's baseline.  Heparin levels and aPTT's are now correlating, can discontinue aPTT checks and dose heparin drip off of heparin levels only.  Discussed w/ RN - no issues with line running and no bleeding noted.  Discussed with CCS - continue heparin drip for now in case further procedures needed - patient is high risk for developing intra-abdominal abscess or rectal stump dehiscence  Goal of Therapy:  Heparin level 0.3-0.7 units/ml aPTT 66-102 seconds Monitor platelets by anticoagulation protocol: Yes   Plan:  Continue heparin drip at 1450 units/hr Monitor daily heparin level and CBC Discontinue aPTT checks Monitor for s/sx bleeding  F/u ability to transition back to [place], PharmD 11/29/2020 11:17 AM

## 2020-11-29 NOTE — Progress Notes (Addendum)
Progress Note  1 Day Post-Op  Subjective: He is having abdominal pain but decreased severity from prior to OR yesterday afternoon. No nausea or emesis with NG tube in. No other complaints  Objective: Vital signs in last 24 hours: Temp:  [97.5 F (36.4 C)-99.2 F (37.3 C)] 97.6 F (36.4 C) (08/26 0549) Pulse Rate:  [77-100] 90 (08/26 0549) Resp:  [13-41] 20 (08/26 0549) BP: (92-157)/(38-95) 96/66 (08/26 0549) SpO2:  [94 %-100 %] 98 % (08/26 0549) Arterial Line BP: (129-161)/(51-68) 129/53 (08/25 1958) Weight:  [87.1 kg] 87.1 kg (08/26 0500) Last BM Date: 11/28/20  Intake/Output from previous day: 08/25 0701 - 08/26 0700 In: 4357.4 [I.V.:2942.4; Blood:315; IV Piggyback:1100] Out: 975 [Urine:655; Drains:220; Blood:100] Intake/Output this shift: No intake/output data recorded.  PE: General: pleasant, WD, male who is laying in bed in NAD HEENT: head is normocephalic, atraumatic. Mouth is pink and moist. Poor dentition Lungs: Respiratory effort nonlabored Abd: soft. Mild distension. Moderate diffuse tenderness diffusely. Some soft blood tinged brown stool in colostomy bag. Stoma visible through bag is purple Midline incision with dressing c/d/I - packing in place NGT tube with minimal bilious output MS: all 4 extremities are symmetrical with no cyanosis, clubbing, or edema. No calf TTP bilaterally Skin: warm and dry with no masses, lesions, or rashes Psych: A&Ox3 with an appropriate affect.   Lab Results:  Recent Labs    11/28/20 2204 11/29/20 0352  WBC 5.9 11.3*  HGB 10.4* 11.1*  HCT 33.1* 35.2*  PLT 194 213    BMET Recent Labs    11/28/20 0045 11/28/20 1734 11/28/20 1812 11/29/20 0352  NA 141   < > 143 143  K 3.6   < > 3.7 4.5  CL 108  --   --  114*  CO2 25  --   --  21*  GLUCOSE 142*  --   --  166*  BUN 13  --   --  17  CREATININE 0.82  --   --  1.16  CALCIUM 8.8*  --   --  8.0*   < > = values in this interval not displayed.    PT/INR No results  for input(s): LABPROT, INR in the last 72 hours. CMP     Component Value Date/Time   NA 143 11/29/2020 0352   NA 143 11/25/2020 1554   K 4.5 11/29/2020 0352   CL 114 (H) 11/29/2020 0352   CO2 21 (L) 11/29/2020 0352   GLUCOSE 166 (H) 11/29/2020 0352   BUN 17 11/29/2020 0352   BUN 23 11/25/2020 1554   CREATININE 1.16 11/29/2020 0352   CREATININE 1.00 11/14/2020 1146   CALCIUM 8.0 (L) 11/29/2020 0352   PROT 5.2 (L) 11/29/2020 0352   ALBUMIN 2.3 (L) 11/29/2020 0352   AST 27 11/29/2020 0352   AST 35 11/14/2020 1146   ALT 14 11/29/2020 0352   ALT 45 (H) 11/14/2020 1146   ALKPHOS 71 11/29/2020 0352   BILITOT 1.0 11/29/2020 0352   BILITOT 0.3 11/14/2020 1146   GFRNONAA >60 11/29/2020 0352   GFRNONAA >60 11/14/2020 1146   GFRAA >60 11/29/2019 1306   Lipase     Component Value Date/Time   LIPASE 22 11/27/2020 1035       Studies/Results: DG Abd 1 View  Result Date: 11/29/2020 CLINICAL DATA:  Check gastric catheter placement EXAM: ABDOMEN - 1 VIEW COMPARISON:  Film from earlier in the same day. FINDINGS: Nephroureteral stents are noted bilaterally. Gastric catheter is now noted within the  stomach. No obstructive changes are seen. No free air is noted. IMPRESSION: Gastric catheter within the stomach. Electronically Signed   By: Inez Catalina M.D.   On: 11/29/2020 03:54   DG Abd 1 View  Result Date: 11/28/2020 CLINICAL DATA:  Abdominal pain, worsening acutely today. EXAM: ABDOMEN - 1 VIEW COMPARISON:  Comparison made with abdominal radiograph from November 28, 2020. FINDINGS: Lucency above the area of the distended sigmoid colon on the supine portable radiograph. Added density with the configuration of the sigmoid colon in the central abdomen compatible with large amount of stool. Bilateral percutaneous nephroureteral stents are in place. Some increasing distension of small bowel loops in the LEFT abdomen are suggested. On limited assessment there is no acute skeletal process. IMPRESSION:  Large amount of stool in the distal colon as described in this patient with known tumor in the pelvis that appears to partially obstruct the colon. Increased lucency over lying the upper margin of stool filled sigmoid colon more likely reflects gas-filled transverse colon. Given findings on prior imaging of colonic obstruction with dilation of the sigmoid would suggest upright and supine abdominal radiographs to exclude the possibility of free air. Slight increase in small bowel distension since previous CT imaging is suggested. This may reflect developing ileus. These results were called by telephone at the time of interpretation on 11/28/2020 at 3:07 pm to provider [facility] , who verbally acknowledged these results. Electronically Signed   By: Zetta Bills M.D.   On: 11/28/2020 15:00   DG Abd 1 View  Result Date: 11/28/2020 CLINICAL DATA:  Constipation EXAM: ABDOMEN - 1 VIEW COMPARISON:  Portable exam 0505 hours compared to 11/10/2020 FINDINGS: Interval clearance of contrast. Significantly increased stool in colon especially sigmoid colon and rectum. Slight gaseous distention of sigmoid loop. Small bowel gas pattern normal. BILATERAL ureteral stent/nephrostomy tubes unchanged. Bones demineralized. IMPRESSION: Significantly increased stool burden. Electronically Signed   By: Lavonia Dana M.D.   On: 11/28/2020 08:20   CT ABDOMEN PELVIS W CONTRAST  Result Date: 11/27/2020 CLINICAL DATA:  Bowel obstruction suspected, history of prostate cancer, ongoing chemotherapy EXAM: CT ABDOMEN AND PELVIS WITH CONTRAST TECHNIQUE: Multidetector CT imaging of the abdomen and pelvis was performed using the standard protocol following bolus administration of intravenous contrast. CONTRAST:  49mL OMNIPAQUE IOHEXOL 350 MG/ML SOLN, additional oral enteric contrast COMPARISON:  CT abdomen pelvis, 11/20/2020 FINDINGS: Lower chest: Trace bilateral pleural effusions and associated atelectasis or consolidation. Hepatobiliary: No  solid liver abnormality is seen. No gallstones, gallbladder wall thickening, or biliary dilatation. Pancreas: Unremarkable. No pancreatic ductal dilatation or surrounding inflammatory changes. Spleen: Normal in size without significant abnormality. Adrenals/Urinary Tract: Adrenal glands are unremarkable. Bilateral percutaneous nephrostomy tubes with formed pigtails in the renal pelves. Additional bilateral double-J ureteral stent catheters with formed pigtails in the renal pelves and urinary bladder. The urinary bladder is again severely thickened. Stomach/Bowel: Stomach is within normal limits. Appendix appears normal. There is again noted a large burden of stool throughout the colon and rectum, with very large stool balls in the distended sigmoid colon. The caliber of the colon at this level is 9.9 cm, previously no greater than 6.5 cm (series 4, image 57). Vascular/Lymphatic: Aortic atherosclerosis. Multiple enlarged retroperitoneal, bilateral iliac, and pelvic sidewall lymph nodes are unchanged from recent CT. Reproductive: Gross prostatomegaly. Other: Mild anasarca. Trace ascites. Right eccentric anterior pelvic mass is unchanged compared to recent prior CT (series 2, image 88). Musculoskeletal: Multiple sclerotic osseous metastases, unchanged. IMPRESSION: 1. There is again noted  a large burden of stool throughout the colon and rectum, with very large stool balls in the distended sigmoid colon. The caliber of the colon at this level is 9.9 cm, previously no greater than 6.5 cm. Findings are concerning for fecal impaction, with abnormal transit due to adjacent prostate malignancy. 2. Bilateral percutaneous nephrostomy tubes with formed pigtails in the renal pelves and urinary bladder. Additional bilateral double-J ureteral stent catheters with formed pigtails in the renal pelves and urinary bladder. No hydronephrosis. 3. Findings of advanced metastatic prostate malignancy not significantly changed compared to  recent CT. 4. The urinary bladder is again severely thickened, likely related to chronic outlet obstruction, possibly with superimposed infectious or inflammatory cystitis. 5. Trace bilateral pleural effusions and associated atelectasis or consolidation. 6. Trace ascites and anasarca. Aortic Atherosclerosis (ICD10-I70.0). Electronically Signed   By: Eddie Candle M.D.   On: 11/27/2020 13:58    Anti-infectives: Anti-infectives (From admission, onward)    Start     Dose/Rate Route Frequency Ordered Stop   11/28/20 2230  piperacillin-tazobactam (ZOSYN) IVPB 3.375 g        3.375 g 12.5 mL/hr over 240 Minutes Intravenous Every 8 hours 11/28/20 2121     11/28/20 2215  piperacillin-tazobactam (ZOSYN) IVPB 3.375 g  Status:  Discontinued       Note to Pharmacy: Given Intraoperative   3.375 g 100 mL/hr over 30 Minutes Intravenous Every 8 hours 11/28/20 2117 11/28/20 2121   11/28/20 1730  piperacillin-tazobactam (ZOSYN) IVPB 3.375 g       Note to Pharmacy: Given Intraoperative   3.375 g 100 mL/hr over 30 Minutes Intravenous  Once 11/28/20 1729 11/28/20 1750   11/28/20 1712  piperacillin-tazobactam (ZOSYN) 3.375 GM/50ML IVPB       Note to Pharmacy: Marchia Meiers   : cabinet override      11/28/20 1712 11/28/20 1735   11/28/20 1545  ceFAZolin (ANCEF) IVPB 2g/100 mL premix        2 g 200 mL/hr over 30 Minutes Intravenous  Once 11/28/20 1538 11/28/20 1630   11/28/20 1539  ceFAZolin (ANCEF) 2-4 GM/100ML-% IVPB       Note to Pharmacy: Minor, Anneita   : cabinet override      11/28/20 1539 11/28/20 1654   11/27/20 1700  cephALEXin (KEFLEX) capsule 500 mg  Status:  Discontinued        500 mg Oral 3 times daily 11/27/20 1640 11/28/20 2117        Assessment/Plan Fecal impaction/Severe acute on chronic constipation -POD1 s/p ex lap, sigmoid colectomy, end colostomy Dr. Kae Heller 8/25 Findings of large perforation with diffuse feculent peritonitis and extensive contamination of the peritoneal cavity with  stool and pus -He is high risk for abscess formation/  We will follow his labs and exam closely and consider repeat CT scan in the next few days -Continue NGT to LIWS and await reliable bowel function, having some colostomy output this a.m. -WOC consulted -Twice daily wet-to-dry dressing changes to midline wound -Multimodal pain control to include IV Tylenol, incentive spirometry, PT/OT -Received pRBC transfusion in the OR.  Hemoglobin responded appropriately and stable.  Has resumed heparin drip  FEN: N.p.o., sips/chips, IVF at 100 ml/hr ID: keflex. Zosyn 8/25>> VTE: heparin gtt   Metastatic prostate cancer anemia DVT, b/l LE     LOS: 2 days    Winferd Humphrey, [facility] 11/29/2020, 9:54 AM Please see Amion for pager number during day hours 7:00am-4:30pm

## 2020-11-29 NOTE — Progress Notes (Addendum)
Patient had 3 beat run of Vtach. He states he is having heart burn. Notified X. Blount. New order for one-time dose IV pepcid. Will give and continue to monitor.

## 2020-11-29 NOTE — Consult Note (Addendum)
[place] Nurse ostomy consult note Surgical team is following for assessment and plan of care for abd wound.   Pt had colostomy surgery yesterday.  He is currently feeling poorly and has an NG, no family members are in the room.  Informed patient the [place] team will perform another pouch change and teaching session on Monday, when he is feeling better and can participate fully.  He requests that his niece be present for that teaching session, since he lives with her.  He does not know what time will be good for her to come at this point. Current pouch is leaking behind the barrier.  Stoma type/location: Stoma is black and covered with clotted blood.  1 3/4 inches and oval, slightly above skin level. Peristomal assessment: intact skin surrounding Treatment options for stomal/peristomal skin:  Applied barrier ring to attempt to maintain a seal and 2 piece pouching system.  Pt watched and asked appropriate questions, but did not assist since he has feeling poorly.  Output: Small amt liquid brown stool  Ostomy pouching: 2pc:  Use Supplies: barrier ring, [place] # [phone], wafer Kellie Simmering # 2, pouch Lawson # [phone].   5 sets of barrier rings/wafers/pouches left at bedside for staff nurses' use, along with educational materials. Pt may eventually require a convex pouching system if outer layer of stoma begins to slough off. Enrolled patient in [place] program: Not yet WOC team will perform another teaching session on Mon. Julien Girt MSN, RN, [place], [place], [place]

## 2020-11-30 DIAGNOSIS — E870 Hyperosmolality and hypernatremia: Secondary | ICD-10-CM

## 2020-11-30 DIAGNOSIS — E875 Hyperkalemia: Secondary | ICD-10-CM

## 2020-11-30 LAB — CBC WITH DIFFERENTIAL/PLATELET
Band Neutrophils: 9 %
Basophils Relative: 0 %
Blasts: NONE SEEN %
Eosinophils Relative: 0 %
HCT: 31.5 % — ABNORMAL LOW (ref 39.0–52.0)
Hemoglobin: 9.8 g/dL — ABNORMAL LOW (ref 13.0–17.0)
Lymphocytes Relative: 1 %
MCH: 29 pg (ref 26.0–34.0)
MCHC: 31.1 g/dL (ref 30.0–36.0)
MCV: 93.2 fL (ref 80.0–100.0)
Metamyelocytes Relative: NONE SEEN %
Monocytes Relative: 3 %
Myelocytes: NONE SEEN %
Neutrophils Relative %: 87 %
Platelets: 193 10*3/uL (ref 150–400)
Promyelocytes Relative: NONE SEEN %
RBC Morphology: NORMAL
RBC: 3.38 MIL/uL — ABNORMAL LOW (ref 4.22–5.81)
RDW: 18.5 % — ABNORMAL HIGH (ref 11.5–15.5)
WBC Morphology: NORMAL
WBC: 20.7 10*3/uL — ABNORMAL HIGH (ref 4.0–10.5)
nRBC: 0.1 % (ref 0.0–0.2)
nRBC: NONE SEEN /100 WBC

## 2020-11-30 LAB — COMPREHENSIVE METABOLIC PANEL
ALT: 11 U/L (ref 0–44)
ALT: 10 U/L (ref 0–44)
AST: 26 U/L (ref 15–41)
AST: 26 U/L (ref 15–41)
Albumin: 2 g/dL — ABNORMAL LOW (ref 3.5–5.0)
Albumin: 1.9 g/dL — ABNORMAL LOW (ref 3.5–5.0)
Alkaline Phosphatase: 69 U/L (ref 38–126)
Alkaline Phosphatase: 85 U/L (ref 38–126)
Anion gap: 8 (ref 5–15)
Anion gap: 7 (ref 5–15)
BUN: 28 mg/dL — ABNORMAL HIGH (ref 8–23)
BUN: 29 mg/dL — ABNORMAL HIGH (ref 8–23)
CO2: 19 mmol/L — ABNORMAL LOW (ref 22–32)
CO2: 19 mmol/L — ABNORMAL LOW (ref 22–32)
Calcium: 8 mg/dL — ABNORMAL LOW (ref 8.9–10.3)
Calcium: 7.8 mg/dL — ABNORMAL LOW (ref 8.9–10.3)
Chloride: 120 mmol/L — ABNORMAL HIGH (ref 98–111)
Chloride: 115 mmol/L — ABNORMAL HIGH (ref 98–111)
Creatinine, Ser: 1.37 mg/dL — ABNORMAL HIGH (ref 0.61–1.24)
Creatinine, Ser: 1.31 mg/dL — ABNORMAL HIGH (ref 0.61–1.24)
GFR, Estimated: 55 mL/min — ABNORMAL LOW (ref >60)
GFR, Estimated: 59 mL/min — ABNORMAL LOW (ref >60)
Glucose, Bld: 167 mg/dL — ABNORMAL HIGH (ref 70–99)
Glucose, Bld: 100 mg/dL — ABNORMAL HIGH (ref 70–99)
Potassium: 5.8 mmol/L — ABNORMAL HIGH (ref 3.5–5.1)
Potassium: 4.7 mmol/L (ref 3.5–5.1)
Sodium: 147 mmol/L — ABNORMAL HIGH (ref 135–145)
Sodium: 141 mmol/L (ref 135–145)
Total Bilirubin: 0.4 mg/dL (ref 0.3–1.2)
Total Bilirubin: 0.5 mg/dL (ref 0.3–1.2)
Total Protein: 5.3 g/dL — ABNORMAL LOW (ref 6.5–8.1)
Total Protein: 5.1 g/dL — ABNORMAL LOW (ref 6.5–8.1)

## 2020-11-30 LAB — PHOSPHORUS: Phosphorus: 3.9 mg/dL (ref 2.5–4.6)

## 2020-11-30 LAB — MAGNESIUM: Magnesium: 2.2 mg/dL (ref 1.7–2.4)

## 2020-11-30 LAB — HEPARIN LEVEL (UNFRACTIONATED): Heparin Unfractionated: 0.37 IU/mL (ref 0.30–0.70)

## 2020-11-30 MED ORDER — SODIUM ZIRCONIUM CYCLOSILICATE 10 G PO PACK
10.0000 g | PACK | Freq: Once | ORAL | Status: AC
  Administered 2020-11-30: 10 g via ORAL
  Filled 2020-11-30: qty 1

## 2020-11-30 MED ORDER — LACTATED RINGERS IV BOLUS
1000.0000 mL | Freq: Once | INTRAVENOUS | Status: AC
  Administered 2020-11-30: 1000 mL via INTRAVENOUS

## 2020-11-30 MED ORDER — DEXTROSE 5 % IV SOLN: INTRAVENOUS | Status: DC

## 2020-11-30 MED ORDER — ACETAMINOPHEN 10 MG/ML IV SOLN
1000.0000 mg | Freq: Four times a day (QID) | INTRAVENOUS | Status: AC
  Administered 2020-11-30 – 2020-12-01 (×4): 1000 mg via INTRAVENOUS
  Filled 2020-11-30 (×4): qty 100

## 2020-11-30 NOTE — Consult Note (Signed)
Consultation Note Date: 11/30/2020   Patient Name: Andre Glass  DOB: [birth year]  MRN: [id number]  Age / Sex: 70 y.o., male  PCP: [name], DO Referring Physician: Kerney Elbe, DO  Reason for Consultation: Establishing goals of care  HPI/Patient Profile: 70 y.o. male  with past medical history of metastatic prostate cancer with obstructive uropathy requiring nephrostomy tube placement, recent CT with concern for malignant stricture and intra-abdominal metastatic disease, bilateral lower extremity DVT admitted on 11/27/2020 with abdominal pain with subsequent worsening and ex lap/ostomy performed urgently yesterday.  Palliative consulted for goals of care.  Clinical Assessment and Goals of Care: I met today with Andre Glass. We discussed clinical course as well as wishes moving forward in regard to care plan this hospitalization and his prior completed advanced directives.  Concepts specific to code status and surrogate decision making discussed.  We discussed difference between a aggressive medical intervention path and a palliative, comfort focused care path.  Values and goals of care important to patient and family were attempted to be elicited.   He reports that there has been a lot going on over the past couple of days and he is still working to process everything.  We reviewed his understanding of need for ex lap and colectomy due to perforation and that he is at high risk for continued complications and the care team will be watching him very closely over the next several days.  He does confirm that his brother would be his surrogate in the event he cannot make his own medical decisions.  We reviewed documents he had on file and he confirms these are up-to-date.   Questions and concerns addressed.   PMT will continue to support holistically.   SUMMARY OF RECOMMENDATIONS   -Full code/full  scope -Reviewed documents on file.  He confirms that his brother is his [facility] POA in the event he cannot make his own decisions. -Currently invested in plan for continued aggressive care.  We reviewed concerns he is at high risk for further complications including abscess formation.  Reports he is "taking it day by day." -Palliative to continue to follow.  We will plan to check in this weekend or early next week. -Please call if there are palliative specific needs with which we can be of assistance with in the interim.  code Status/Advance Care Planning: Full code   Palliative Prophylaxis:  Frequent Pain Assessment  Additional Recommendations (Limitations, Scope, Preferences): Full Scope Treatment  Prognosis:  Guarded  Discharge Planning: To Be Determined      Primary Diagnoses: Present on Admission:  Fecal impaction [facility])  Metastatic castration-resistant adenocarcinoma of prostate ([place])  BPH (benign prostatic hyperplasia)  Symptomatic anemia  Hypokalemia  Hypomagnesemia  DVT of deep femoral vein, left ([place])   I have reviewed the medical record, interviewed the patient and family, and examined the patient. The following aspects are pertinent.  Past Medical History:  Diagnosis Date   Anemia    History of kidney stones    Prostate cancer (  [place])    UTI (urinary tract infection)    Social History   Socioeconomic History   Marital status: Single    Spouse name: Not on file   Number of children: 0   Years of education: Not on file   Highest education level: Not on file  Occupational History   Not on file  Tobacco Use   Smoking status: Never   Smokeless tobacco: Never  Vaping Use   Vaping Use: Never used  Substance and Sexual Activity   Alcohol use: Never   Drug use: Never   Sexual activity: Not on file  Other Topics Concern   Not on file  Social History Narrative   Not on file   Social Determinants of Health   Financial Resource Strain: Not on file  Food  Insecurity: Not on file  Transportation Needs: Not on file  Physical Activity: Not on file  Stress: Not on file  Social Connections: Not on file   Family History  Problem Relation Age of Onset   Stomach cancer Mother    Pancreatic cancer Sister    Breast cancer Neg Hx    Prostate cancer Neg Hx    Colon cancer Neg Hx    Scheduled Meds:  vitamin C  1,000 mg Oral Daily   chlorhexidine  15 mL Mouth Rinse BID   Chlorhexidine Gluconate Cloth  6 each Topical Daily   mouth rinse  15 mL Mouth Rinse q12n4p   mirtazapine  7.5 mg Oral QHS   multivitamin with minerals  1 tablet Oral Daily   naloxegol oxalate  12.5 mg Oral Daily   Plecanatide  3 mg Oral Daily   predniSONE  5 mg Oral Q breakfast   pregabalin  50 mg Oral BID   sodium zirconium cyclosilicate  10 g Oral Once   Continuous Infusions:  acetaminophen     dextrose     heparin 1,450 Units/hr (11/29/20 0440)   lactated ringers     piperacillin-tazobactam (ZOSYN)  IV 3.375 g (11/30/20 0546)   PRN Meds:.HYDROmorphone (DILAUDID) injection, ondansetron **OR** ondansetron (ZOFRAN) IV, prochlorperazine, sodium chloride flush, tamsulosin Medications Prior to Admission:  Prior to Admission medications   Medication Sig Start Date End Date Taking? Authorizing Provider  acetaminophen (TYLENOL) 325 MG tablet Take 2 tablets (650 mg total) by mouth every 6 (six) hours as needed for headache. 10/30/20  Yes Ezequiel Essex, MD  amLODipine (NORVASC) 10 MG tablet Take 1 tablet (10 mg total) by mouth daily. 10/05/20  Yes Rai, Ripudeep K, MD  feeding supplement (ENSURE ENLIVE / ENSURE PLUS) LIQD Take 237 mLs by mouth 3 (three) times daily between meals. 10/30/20  Yes Ezequiel Essex, MD  Iron-Vitamins (GERITOL COMPLETE) TABS Take 1 tablet by mouth daily.   Yes [provider]  lidocaine-prilocaine (EMLA) cream Apply 1 application topically as needed. 01/15/20  Yes [name], MD  mirtazapine (REMERON) 7.5 MG tablet TAKE 1 TABLET BY MOUTH AT  BEDTIME. 11/21/20  Yes Gifford Shave, MD  Multiple Vitamin (MULTIVITAMIN WITH MINERALS) TABS tablet Take 1 tablet by mouth daily.   Yes [provider]  naloxegol oxalate (MOVANTIK) 12.5 MG TABS tablet Take 1 tablet (12.5 mg total) by mouth daily. Take it in the morning 1 hour before breakfast daily for constipation. 10/05/20  Yes Rai, Ripudeep K, MD  ondansetron (ZOFRAN) 4 MG tablet Take 1 tablet (4 mg total) by mouth 3 (three) times daily as needed for nausea or vomiting. 09/24/20  Yes Shadad, Mathis Dad,  MD  Plecanatide (TRULANCE) 3 MG TABS Take 3 mg by mouth daily. 08/08/20  Yes [name], [name], DO  polyethylene glycol (MIRALAX) 17 g packet Take 17 g by mouth 2 (two) times daily. Patient taking differently: Take 17 g by mouth daily as needed for mild constipation. 11/06/20  Yes Alen Bleacher, MD  predniSONE (DELTASONE) 5 MG tablet TAKE 1 TABLET BY MOUTH EVERY DAY WITH BREAKFAST Patient taking differently: Take 5 mg by mouth daily with breakfast. 10/18/20  Yes Shadad, Mathis Dad, MD  pregabalin (LYRICA) 50 MG capsule Take 1 capsule (50 mg total) by mouth 2 (two) times daily. 10/30/20 12/29/20 Yes Ezequiel Essex, MD  prochlorperazine (COMPAZINE) 10 MG tablet Take 1 tablet (10 mg total) by mouth every 6 (six) hours as needed for nausea or vomiting. 09/24/20  Yes Shadad, Mathis Dad, MD  RIVAROXABAN [employer]) VTE STARTER PACK (15 & 20 MG) Follow package directions: Take one 68m tablet by mouth twice a day. On day 22, switch to one 270mtablet once a day. Take with food. Patient taking differently: Take 15-20 mg by mouth See admin instructions. Follow package directions: Take one 1521mablet by mouth twice a day. On day 22, switch to one 38m8mblet once a day. Take with food. 11/20/20  Yes HammLorin Glass-C  tamsulosin (FLOMAX) 0.4 MG CAPS capsule TAKE 1 CAPSULE (0.4 MG TOTAL) BY MOUTH DAILY AS NEEDED (TO HELP WITH URINATION). 11/21/20  Yes CresGifford Shave  vitamin C (ASCORBIC ACID) 500 MG tablet  Take 1,000 mg by mouth daily.   Yes [provider]  linaclotide (LINZESS) 290 MCG CAPS capsule Take 1 capsule (290 mcg total) by mouth daily as needed (constipation, take before breakfast). Patient not taking: No sig reported 10/05/20   Rai,Mendel Corning   Allergies  Allergen Reactions   Ibuprofen Anaphylaxis   Shrimp [Shellfish Allergy] Anaphylaxis   Review of Systems  Constitutional:  Positive for fatigue.  HENT:  Positive for sore throat.   Gastrointestinal:  Positive for abdominal pain.  Neurological:  Positive for weakness.   Physical Exam General: Sleepy but arouses easily  HEENT: No bruits, no goiter, no JVD Heart: Regular rate and rhythm. No murmur appreciated. Lungs: Good air movement, clear Abdomen: Soft, globally tender  Ext: No significant edema Skin: Warm and dry  Vital Signs: BP (!) 154/72 (BP Location: Right Arm)   Pulse (!) 108   Temp 98.2 F (36.8 C) (Oral)   Resp 18   Ht _0  (1.727 m)   Wt 88.3 kg   SpO2 97%   BMI 29.60 kg/m  Pain Scale: 0-10   Pain Score: Asleep   SpO2: SpO2: 97 % O2 Device:SpO2: 97 % O2 Flow Rate: .O2 Flow Rate (L/min): 2 L/min  IO: Intake/output summary:  Intake/Output Summary (Last 24 hours) at 11/30/2020 1027 Last data filed at 11/30/2020 0553 Gross per 24 hour  Intake --  Output [id number] Net -[id number]   LBM: Last BM Date: 11/29/20 Baseline Weight: Weight: 83.5 kg Most recent weight: Weight: 88.3 kg     Palliative Assessment/Data:   Flowsheet Rows    Flowsheet Row Most Recent Value  Intake Tab   Referral Department Hospitalist  Unit at Time of Referral Med/Surg Unit  Palliative Care Primary Diagnosis Sepsis/Infectious Disease  Date Notified 11/27/20  Palliative Care Type New Palliative care  Reason for referral Clarify Goals of Care  Date of Admission 11/27/20  # of days IP prior to Palliative  referral 0  Clinical Assessment   Psychosocial & Spiritual Assessment   Palliative Care Outcomes         Time Total: 55 minutes Greater than 50%  of this time was spent counseling and coordinating care related to the above assessment and plan.  Signed by: Micheline Rough, MD   Please contact Palliative Medicine Team phone at [phone] for questions and concerns.  For individual provider: See Shea Evans

## 2020-11-30 NOTE — Progress Notes (Signed)
PROGRESS NOTE    Andre Glass  XVQ:[id number] DOB: [birth year] DOA: 11/27/2020 PCP: [name], DO  Brief Narrative:  The patient is a 70 year old African-American male with a past medical history significant for but not limited to metastatic prostate cancer with resultant obstructive uropathy requiring bilateral nephrostomy tube placement with other comorbidities who presented with worsening right-sided abdominal pain that started on the morning of yesterday.  The pain was found to be in the right lower quadrant and was a sharp 10 out of 10 in severity.  Patient dates that he passed gas yesterday and had several loose bowel movements but states nothing made the pain worse or better.  He recently had a CT scan on 11/20/2020 which showed possible malignant stricture was progressive and intra-abdominal metastatic disease and extensive osseous metastatic disease as well as evidence of bilateral lower extremity DVT and pelvic veins being compressed by pelvic adenopathy.  He had right peroneal DVT and acute left femoral DVT extending through the veins of the left lower leg and vascular surgery was consulted at that time and recommended no intervention discharged on anticoagulation with Xarelto.  He is currently on amoxicillin and Trulance without relief.  He was given fleets enema without relief in the ED.  He was also noted to have a hemoglobin of 6.6 down from 7.5 a few days ago.  He was transfused 1 unit PRBCs and general surgery as well as gastroenterology were consulted and they recommended bowel regimen and no surgical intervention unless the patient did not improve however subsequently after his enema today the patien likely perforated secondary to history of colon ulcer and had rigidity and guarding consistent with peritonitis.  General surgery is taking him to the OR for emergent surgery for a laparotomy, partial colectomy and colostomy and he is postoperative day 1 for exploratory laparotomy, sigmoid  colectomy and end colostomy with Dr. Yancey Flemings on 11/28/2020.  Findings intraoperatively showed a large perforation with diffuse feculent peritonitis and extensive contamination of the peritoneal cavity with stool and pus  General surgery feels that he is at very high risk for abscess formation and they are recommending following his labs closely and consider repeating a scan in next few days.  They are recommending continuing NG tube to low intermittent suction and await viable bowel function.  He does have some colostomy output.  Wound care has been consulted and he has been getting wet-to-dry dressings twice daily and pain control with IV acetaminophen.  His electrolytes and labs are been off so we will stop his potassium supplementation and change his fluid to D5W and give him a dose of Lokelma   Assessment & Plan:   Principal Problem:   Fecal impaction ([place]) Active Problems:   Metastatic castration-resistant adenocarcinoma of prostate (HCC)   BPH (benign prostatic hyperplasia)   Symptomatic anemia   Hypokalemia   Hypomagnesemia   DVT of deep femoral vein, left (HCC)  Fecal impaction with acute on chronic constipation and possible stercoral ulcer with perforation resulting in peritonitis status post exploratory laparotomy, sigmoid colectomy and end colostomy by Dr. Windle Guard on 11/28/2020 laparotomies/ -Patient presents with a 3-day history of fecal impaction secondary to prostate cancer along with his home Dilaudid -He has tried senna and MiraLAX in the past with out relief -Patient takes Movantik and flecainide and he was given Colace and senna trial of tapwater enemas as well as lactulose enemas -GI was consulted and recommended enemas the way he takes them -After his enema today he became  extremely hypotensive and had some guarding and rigidity of his abdomen and became diaphoretic -Stat KUB showed possible free air -General surgery was reconsulted and took the patient to the OR emergently -He  is postoperative day 1 and he is at very high risk for abscess formation and general surgery recommends continue to follow his labs and exam closely and consider repeating a CT scan in next few days -Wound care has been consulted -He is getting IV fluid hydration with normal saline 100 MLS per hour -General surgery recommending continue NG tube to low intermittent suction until he has reliable bowel function as they feel an ileus is expected -General surgery thinks his ostomy is tenuous but is working they are recommending continue IV antibiotics with IV Zosyn and JP drain for now; continue to monitor and may need a repeat CT scan soon -Palliative care has been consulted for goals of care discussion and appreciate their evaluation  Prostate cancer with subsequent pain -Continue with Tylenol, hydrocodone and Dilaudid -General surgery started him on IV acetaminophen  History of DVT -Continue with heparin drip  AKI Metabolic Acidosis -Patient's BUNs/creatinine went from 13/0.82 -> 17/1.16 -> 28/1.37 -Patient has a mild metabolic acidosis with a CO2 of 19, anion gap of 8, chloride level of 120 -Change IV fluid hydration change to D5W at 75 mils per hour and will give a 1 L LR bolus -Avoid nephrotoxic medications, contrast dyes, hypotension renally dose medications -Repeat CMP in the a.m.  Acute on chronic anemia likely of chronic disease -Patient's hemoglobin/hematocrit dropped to 6.6/21.5 and he was typed and screened and transfused 1 unit of PRBCs; after 1 unit of PRBCs hemoglobin/hematocrit improved to 7.8/24.7 yesterday and he was also transfused in the OR; hemoglobin/hematocrit is now trended up to 11.1/35.2 yesterday and today it is 9.8/31.5 -He remains on a heparin drip and will need to monitor for signs and symptoms of bleeding -Continue monitor and trend repeat CBC in a.m.  Hematuria -In the setting of being on anticoagulation -Continue to monitor for gross hematuria and may need  urology consultation  Hypokalemia -> Hyperkalemia -Improved and resolved as potassium is now 4.5 however trended up to 5.8 so we will stop the the KCl and the fluids give a dose of Lokelma changed to D5W; given a dose of LR - Continue monitor and replete as necessary  Hypomagnesemia -Improved and resolved as magnesium was 2.2 -Continue monitor and replete as necessary  Hypernatremia -Patient sodium went from 143 and is now 47 -Started D5W at 75 mils per hour -Continue monitor and trend and repeat CMP in  Leukocytosis -In the setting of fecal impaction and perforation now -WBC trended up from 24.4 is now 28.1 yesterday but then trended down to 5.9 is now trending back up to 11.3 in setting of his surgery and is now 20.7 -Currently getting cefazolin for surgical prophylaxis and was also on p.o. Keflex 500 mg 3 times daily; also received a dose of IV Zosyn  DVT prophylaxis: Anticoagulated with a heparin drip Code Status: Full code Family Communication: No family present at bedside Disposition Plan: Pending further clinical improvement and clearance by specialists  Status is: Inpatient  Remains inpatient appropriate because:Unsafe d/c plan, IV treatments appropriate due to intensity of illness or inability to take PO, and Inpatient level of care appropriate due to severity of illness  Dispo: The patient is from: Home              Anticipated d/c is to:  TBD  Patient currently is not medically stable to d/c.   Difficult to place patient No  Consultants:  General surgery Gastroenterology Palliative care medicine  Procedures: Emergent surgery: Procedure performed: Exploratory laparotomy, sigmoid colectomy with end colostomy  Antimicrobials: Anti-infectives (From admission, onward)    Start     Dose/Rate Route Frequency Ordered Stop   11/28/20 2230  piperacillin-tazobactam (ZOSYN) IVPB 3.375 g        3.375 g 12.5 mL/hr over 240 Minutes Intravenous Every 8 hours  11/28/20 2121     11/28/20 2215  piperacillin-tazobactam (ZOSYN) IVPB 3.375 g  Status:  Discontinued       Note to Pharmacy: Given Intraoperative   3.375 g 100 mL/hr over 30 Minutes Intravenous Every 8 hours 11/28/20 2117 11/28/20 2121   11/28/20 1730  piperacillin-tazobactam (ZOSYN) IVPB 3.375 g       Note to Pharmacy: Given Intraoperative   3.375 g 100 mL/hr over 30 Minutes Intravenous  Once 11/28/20 1729 11/28/20 1750   11/28/20 1712  piperacillin-tazobactam (ZOSYN) 3.375 GM/50ML IVPB       Note to Pharmacy: Marchia Meiers   : cabinet override      11/28/20 1712 11/28/20 1735   11/28/20 1545  ceFAZolin (ANCEF) IVPB 2g/100 mL premix        2 g 200 mL/hr over 30 Minutes Intravenous  Once 11/28/20 1538 11/28/20 1630   11/28/20 1539  ceFAZolin (ANCEF) 2-4 GM/100ML-% IVPB       Note to Pharmacy: Minor, Anneita   : cabinet override      11/28/20 1539 11/28/20 1654   11/27/20 1700  cephALEXin (KEFLEX) capsule 500 mg  Status:  Discontinued        500 mg Oral 3 times daily 11/27/20 1640 11/28/20 2117        Subjective: Seen and examined at bedside and he remains sore and has NG tube and looks uncomfortable.  Labs have worsened we will repeat after fluid hydration.  Surgery is recommending incentive spirometry and out of bed.  No other concerns or concerns at this time.  Objective: Vitals:   11/30/20 0209 11/30/20 0500 11/30/20 0601 11/30/20 0940  BP: 140/81  130/75 (!) 154/72  Pulse: (!) 101  96 (!) 108  Resp: 18  18 18   Temp: 98.2 F (36.8 C)  (!) 97.5 F (36.4 C) 98.2 F (36.8 C)  TempSrc: Oral  Oral Oral  SpO2: 100%  96% 97%  Weight:  88.3 kg    Height:        Intake/Output Summary (Last 24 hours) at 11/30/2020 1657 Last data filed at 11/30/2020 1628 Gross per 24 hour  Intake 0 ml  Output [id number] ml  Net -0350093 ml    Filed Weights   11/28/20 0500 11/29/20 0500 11/30/20 0500  Weight: 88.4 kg 87.1 kg 88.3 kg   Examination: Physical Exam:  Constitutional: WN/WD  chronically ill appearing AAM appears uncomfortable  Eyes: Lids and conjunctivae normal, sclerae anicteric  ENMT: External Ears, Nose appear normal. NGT in place Neck: Appears normal, supple, no cervical masses, normal ROM, no appreciable thyromegaly Respiratory: Diminished to auscultation bilaterally, no wheezing, rales, rhonchi or crackles. Normal respiratory effort and patient is not tachypenic. No accessory muscle use. Unlabored breathing  Cardiovascular: RRR, no murmurs / rubs / gallops. S1 and S2 auscultated. No extremity edema. 2+ pedal pulses. No carotid bruits.  Abdomen: Soft, non-tender, Distended 2/2 body habitus and has a colostomy and a JP Drain.  Bowel sounds positive.  GU: Deferred. Has bilateral  Nephrostomy tubes Musculoskeletal: No clubbing / cyanosis of digits/nails. No joint deformity upper and lower extremities.  Skin: No rashes, lesions, ulcers on a limited skin evaluation. No induration; Warm and dry.  Neurologic: CN 2-12 grossly intact with no focal deficits. Romberg sign and cerebellar reflexes not assessed.  Psychiatric: Normal judgment and insight. Alert and oriented x 3. Normal mood and appropriate affect.   Data Reviewed: I have personally reviewed following labs and imaging studies  CBC: Recent Labs  Lab 11/25/20 1554 11/27/20 1035 11/27/20 1035 11/28/20 0045 11/28/20 0806 11/28/20 1734 11/28/20 1812 11/28/20 2204 11/29/20 0352 11/30/20 0346  WBC 30.0* 24.4*  --  28.1*  --   --   --  5.9 11.3* 20.7*  NEUTROABS 24.0* 21.7*  --   --   --   --   --   --  10.3*  --   HGB 7.5* 6.6*   < > 6.6*   < > 11.2* 9.9* 10.4* 11.1* 9.8*  HCT 23.9* 21.9*   < > 21.5*   < > 33.0* 29.0* 33.1* 35.2* 31.5*  MCV 90 96.1  --  96.0  --   --   --  93.2 93.4 93.2  PLT 443 296  --  275  --   --   --  194 213 193   < > = values in this interval not displayed.    Basic Metabolic Panel: Recent Labs  Lab 11/25/20 1554 11/25/20 1554 11/27/20 1035 11/28/20 0045 11/28/20 1734  11/28/20 1812 11/29/20 0352 11/30/20 0346  NA 143   < > 144 141 142 143 143 147*  K 4.2  --  3.0* 3.6 4.4 3.7 4.5 5.8*  CL 106  --  112* 108  --   --  114* 120*  CO2 21  --  25 25  --   --  21* 19*  GLUCOSE 139*  --  111* 142*  --   --  166* 167*  BUN 23  --  19 13  --   --  17 28*  CREATININE 1.11  --  0.87 0.82  --   --  1.16 1.37*  CALCIUM 9.5  --  8.8* 8.8*  --   --  8.0* 8.0*  MG  --   --  1.5* 2.0  --   --  2.1 2.2  PHOS  --   --   --   --   --   --  4.1 3.9   < > = values in this interval not displayed.    GFR: Estimated Creatinine Clearance: 54.2 mL/min (A) (by C-G formula based on SCr of 1.37 mg/dL (H)). Liver Function Tests: Recent Labs  Lab 11/27/20 1035 11/28/20 0045 11/29/20 0352 11/30/20 0346  AST 33 28 27 26   ALT 21 19 14 11   ALKPHOS 113 113 71 69  BILITOT 0.4 0.4 1.0 0.4  PROT 6.6 6.4* 5.2* 5.3*  ALBUMIN 2.3* 2.1* 2.3* 2.0*    Recent Labs  Lab 11/27/20 1035  LIPASE 22    No results for input(s): AMMONIA in the last 168 hours. Coagulation Profile: No results for input(s): INR, PROTIME in the last 168 hours. Cardiac Enzymes: No results for input(s): CKTOTAL, CKMB, CKMBINDEX, TROPONINI in the last 168 hours. BNP (last 3 results) No results for input(s): PROBNP in the last 8760 hours. HbA1C: No results for input(s): HGBA1C in the last 72 hours. CBG: No results for input(s): GLUCAP in the last 168 hours. Lipid Profile: No results  for input(s): CHOL, HDL, LDLCALC, TRIG, CHOLHDL, LDLDIRECT in the last 72 hours. Thyroid Function Tests: No results for input(s): TSH, T4TOTAL, FREET4, T3FREE, THYROIDAB in the last 72 hours. Anemia Panel: No results for input(s): VITAMINB12, FOLATE, FERRITIN, TIBC, IRON, RETICCTPCT in the last 72 hours. Sepsis Labs: Recent Labs  Lab 11/27/20 1035  LATICACIDVEN 0.8     Recent Results (from the past 240 hour(s))  Resp Panel by RT-PCR (Flu A&B, Covid) Nasopharyngeal Swab     Status: None   Collection Time: 11/27/20   1:51 PM   Specimen: Nasopharyngeal Swab; Nasopharyngeal(NP) swabs in vial transport medium  Result Value Ref Range Status   SARS Coronavirus 2 by RT PCR NEGATIVE NEGATIVE Final    Comment: (NOTE) SARS-CoV-2 target nucleic acids are NOT DETECTED.  The SARS-CoV-2 RNA is generally detectable in upper respiratory specimens during the acute phase of infection. The lowest concentration of SARS-CoV-2 viral copies this assay can detect is 138 copies/mL. A negative result does not preclude SARS-Cov-2 infection and should not be used as the sole basis for treatment or other patient management decisions. A negative result may occur with  improper specimen collection/handling, submission of specimen other than nasopharyngeal swab, presence of viral mutation(s) within the areas targeted by this assay, and inadequate number of viral copies(<138 copies/mL). A negative result must be combined with clinical observations, patient history, and epidemiological information. The expected result is Negative.  Fact Sheet for Patients:  EntrepreneurPulse.com.au  Fact Sheet for Healthcare Providers:  IncredibleEmployment.be  This test is no t yet approved or cleared by the [place] FDA and  has been authorized for detection and/or diagnosis of SARS-CoV-2 by FDA under an Emergency Use Authorization (EUA). This EUA will remain  in effect (meaning this test can be used) for the duration of the COVID-19 declaration under Section 564(b)(1) of the Act, 21 U.S.C.section 360bbb-3(b)(1), unless the authorization is terminated  or revoked sooner.       Influenza A by PCR NEGATIVE NEGATIVE Final   Influenza B by PCR NEGATIVE NEGATIVE Final    Comment: (NOTE) The Xpert Xpress SARS-CoV-2/FLU/RSV plus assay is intended as an aid in the diagnosis of influenza from Nasopharyngeal swab specimens and should not be used as a sole basis for treatment. Nasal washings and aspirates  are unacceptable for Xpert Xpress SARS-CoV-2/FLU/RSV testing.  Fact Sheet for Patients: EntrepreneurPulse.com.au  Fact Sheet for Healthcare Providers: IncredibleEmployment.be  This test is not yet approved or cleared by the [place] FDA and has been authorized for detection and/or diagnosis of SARS-CoV-2 by FDA under an Emergency Use Authorization (EUA). This EUA will remain in effect (meaning this test can be used) for the duration of the COVID-19 declaration under Section 564(b)(1) of the Act, 21 U.S.C. section 360bbb-3(b)(1), unless the authorization is terminated or revoked.  Performed at [facility], [employer] [address] [place], [place] [address]   Urine Culture     Status: None   Collection Time: 11/27/20  3:25 PM   Specimen: Kidney; Urine  Result Value Ref Range Status   Specimen Description   Final    KIDNEY LEFT NEPHROSTOMY Performed at [facility] [address], [place], [place] [address]    Special Requests   Final    NONE Performed at [facility], [place] [address] [place], [place] [address]    Culture   Final    NO GROWTH Performed at [facility], [place] [address], [place], [place] [address]  Report Status 11/29/2020 FINAL  Final  Urine Culture     Status: None   Collection Time: 11/27/20  3:25 PM   Specimen: Kidney; Urine  Result Value Ref Range Status   Specimen Description   Final    KIDNEY RIGHT NEPHROSTOMY Performed at [facility] [address] [place], [place] [address]    Special Requests   Final    NONE Performed at [facility], [place] [address] [place], [place] [address]    Culture   Final    NO GROWTH Performed at [facility], [place] [address], [place], [place] [address]    Report Status 11/29/2020 FINAL  Final      RN Pressure Injury Documentation:     Estimated body mass index is 29.6  kg/m as calculated from the following:   Height as of this encounter: 5\' 8"  (1.727 m).   Weight as of this encounter: 88.3 kg.  Malnutrition Type:  Nutrition Problem: Increased nutrient needs Etiology: cancer and cancer related treatments  Malnutrition Characteristics:  Signs/Symptoms: estimated needs  Nutrition Interventions:  Interventions: Refer to RD note for recommendations   Radiology Studies: DG Abd 1 View  Result Date: 11/29/2020 CLINICAL DATA:  Check gastric catheter placement EXAM: ABDOMEN - 1 VIEW COMPARISON:  Film from earlier in the same day. FINDINGS: Nephroureteral stents are noted bilaterally. Gastric catheter is now noted within the stomach. No obstructive changes are seen. No free air is noted. IMPRESSION: Gastric catheter within the stomach. Electronically Signed   By: Inez Catalina M.D.   On: 11/29/2020 03:54    Scheduled Meds:  vitamin C  1,000 mg Oral Daily   chlorhexidine  15 mL Mouth Rinse BID   Chlorhexidine Gluconate Cloth  6 each Topical Daily   mouth rinse  15 mL Mouth Rinse q12n4p   mirtazapine  7.5 mg Oral QHS   multivitamin with minerals  1 tablet Oral Daily   naloxegol oxalate  12.5 mg Oral Daily   Plecanatide  3 mg Oral Daily   predniSONE  5 mg Oral Q breakfast   pregabalin  50 mg Oral BID   Continuous Infusions:  acetaminophen 1,000 mg (11/30/20 1257)   dextrose 75 mL/hr at 11/30/20 1030   heparin 1,450 Units/hr (11/29/20 0440)   piperacillin-tazobactam (ZOSYN)  IV 3.375 g (11/30/20 1526)    LOS: 3 days   Kerney Elbe, DO [facility] PAGER is on AMION  If 7PM-7AM, please contact night-coverage [email or web address]

## 2020-11-30 NOTE — Progress Notes (Addendum)
[name], male   DOB: 07-01-1950, 70 y.o.   MRN: [id number]   Acute Care Surgery Service Progress Note:    Chief Complaint/Subjective: Co of some abd soreness Raspy voice  Objective: Vital signs in last 24 hours: Temp:  [97.3 F (36.3 C)-98.2 F (36.8 C)] 97.5 F (36.4 C) (08/27 0601) Pulse Rate:  [72-107] 96 (08/27 0601) Resp:  [17-20] 18 (08/27 0601) BP: (98-140)/(64-81) 130/75 (08/27 0601) SpO2:  [96 %-100 %] 96 % (08/27 0601) Weight:  [88.3 kg] 88.3 kg (08/27 0500) Last BM Date: 11/29/20  Intake/Output from previous day: 08/26 0701 - 08/27 0700 In: -  Out: [id number] [YYQMG:[id number]; Emesis/NG output:400; Drains:110; Stool:50] Intake/Output this shift: No intake/output data recorded.  Lungs: cta, nonlabored  Cardiovascular: reg  Abd: soft, expected ttp; ostomy - ischemic appearing - no air in bag; JP - serosang Nephrostomy tube in place  Extremities: no edema, +SCDs  Neuro: alert, nonfocal, raspy voice  Lab Results: CBC  Recent Labs    11/29/20 0352 11/30/20 0346  WBC 11.3* 20.7*  HGB 11.1* 9.8*  HCT 35.2* 31.5*  PLT 213 193   BMET Recent Labs    11/29/20 0352 11/30/20 0346  NA 143 147*  K 4.5 5.8*  CL 114* 120*  CO2 21* 19*  GLUCOSE 166* 167*  BUN 17 28*  CREATININE 1.16 1.37*  CALCIUM 8.0* 8.0*   LFT Hepatic Function Latest Ref Rng & Units 11/30/2020 11/29/2020 11/28/2020  Total Protein 6.5 - 8.1 g/dL 5.3(L) 5.2(L) 6.4(L)  Albumin 3.5 - 5.0 g/dL 2.0(L) 2.3(L) 2.1(L)  AST 15 - 41 U/L '26 27 28  ' ALT 0 - 44 U/L '11 14 19  ' Alk Phosphatase 38 - 126 U/L 69 71 113  Total Bilirubin 0.3 - 1.2 mg/dL 0.4 1.0 0.4   PT/INR No results for input(s): LABPROT, INR in the last 72 hours. ABG Recent Labs    11/28/20 1734 11/28/20 1812  PHART 7.194* 7.272*  HCO3 17.6* 17.7*    Studies/Results:  Anti-infectives: Anti-infectives (From admission, onward)    Start     Dose/Rate Route Frequency Ordered Stop   11/28/20 2230   piperacillin-tazobactam (ZOSYN) IVPB 3.375 g        3.375 g 12.5 mL/hr over 240 Minutes Intravenous Every 8 hours 11/28/20 2121     11/28/20 2215  piperacillin-tazobactam (ZOSYN) IVPB 3.375 g  Status:  Discontinued       Note to Pharmacy: Given Intraoperative   3.375 g 100 mL/hr over 30 Minutes Intravenous Every 8 hours 11/28/20 2117 11/28/20 2121   11/28/20 1730  piperacillin-tazobactam (ZOSYN) IVPB 3.375 g       Note to Pharmacy: Given Intraoperative   3.375 g 100 mL/hr over 30 Minutes Intravenous  Once 11/28/20 1729 11/28/20 1750   11/28/20 1712  piperacillin-tazobactam (ZOSYN) 3.375 GM/50ML IVPB       Note to Pharmacy: Marchia Meiers   : cabinet override      11/28/20 1712 11/28/20 1735   11/28/20 1545  ceFAZolin (ANCEF) IVPB 2g/100 mL premix        2 g 200 mL/hr over 30 Minutes Intravenous  Once 11/28/20 1538 11/28/20 1630   11/28/20 1539  ceFAZolin (ANCEF) 2-4 GM/100ML-% IVPB       Note to Pharmacy: Minor, Anneita   : cabinet override      11/28/20 1539 11/28/20 1654   11/27/20 1700  cephALEXin (KEFLEX) capsule 500 mg  Status:  Discontinued        500 mg  Oral 3 times daily 11/27/20 1640 11/28/20 2117       Medications: Scheduled Meds:  vitamin C  1,000 mg Oral Daily   chlorhexidine  15 mL Mouth Rinse BID   Chlorhexidine Gluconate Cloth  6 each Topical Daily   mouth rinse  15 mL Mouth Rinse q12n4p   mirtazapine  7.5 mg Oral QHS   multivitamin with minerals  1 tablet Oral Daily   naloxegol oxalate  12.5 mg Oral Daily   Plecanatide  3 mg Oral Daily   predniSONE  5 mg Oral Q breakfast   pregabalin  50 mg Oral BID   sodium zirconium cyclosilicate  10 g Oral Once   Continuous Infusions:  dextrose     heparin 1,450 Units/hr (11/29/20 0440)   piperacillin-tazobactam (ZOSYN)  IV 3.375 g (11/30/20 0546)   PRN Meds:.HYDROmorphone (DILAUDID) injection, ondansetron **OR** ondansetron (ZOFRAN) IV, prochlorperazine, sodium chloride flush, tamsulosin  Assessment/Plan: Patient  Active Problem List   Diagnosis Date Noted   Fecal impaction ([place]) 11/27/2020   Hypokalemia 11/27/2020   Hypomagnesemia 11/27/2020   DVT of deep femoral vein, left ([place]) 11/27/2020   Abdominal pain    Severe protein-calorie malnutrition ([place]) 10/21/2020   Acute cystitis without hematuria    Anemia    Bacteremia due to Klebsiella pneumoniae    Altered mental status 10/18/2020   Myoclonus    Symptomatic anemia 10/17/2020   AKI (acute kidney injury) ([place]) 09/28/2020   Hydronephrosis 09/28/2020   Obstructive uropathy 09/28/2020   Intractable pain 01/18/2020   BPH (benign prostatic hyperplasia) 11/08/2019   Catheter-associated urinary tract infection ([place]) 09/14/2019   Drug induced constipation 07/18/2019   Goals of care, counseling/discussion 06/26/2019   Metastatic castration-resistant adenocarcinoma of prostate ([place]) 04/19/2019   s/p Procedure(s): EXPLORATORY LAPAROTOMY/ SIGMOID COLECTOMY COLOSTOMY 11/28/2020 Fecal impaction/Severe acute on chronic constipation -POD2 s/p ex lap, sigmoid colectomy, end colostomy Dr. Kae Heller 8/25 Findings of large perforation with diffuse feculent peritonitis and extensive contamination of the peritoneal cavity with stool and pus -He is high risk for abscess formation/  We will follow his labs and exam closely and consider repeat CT scan in the next few days -Continue NGT to LIWS and await reliable bowel function, monitor ostomy for viability -WOC consulted -Twice daily wet-to-dry dressing changes to midline wound -Multimodal pain control to include IV Tylenol, incentive spirometry, PT/OT -Received pRBC transfusion in the OR.  Hemoglobin responded appropriately and stable.  Has resumed heparin drip  Elevated Creatinine - bump in Cr today,  don't think he put out 8L of urine in talking with nurse tech - they think it may have been a documentation issue.  Heme: drop in hgb 11.1-->9.8; trend FEN: N.p.o., sips/chips, IVF at 100 ml/hr; hyperkalemia -  managed by TRH ID: keflex. Zosyn 8/25>> VTE: heparin gtt   Metastatic prostate cancer anemia DVT, b/l LE  Disposition: needs IS, out of bed to chair, pt/ot; follow labs, change IVF. D/w TRH  LOS: 3 days    Leighton Ruff. Redmond Pulling, MD, FACS General, Bariatric, & Minimally Invasive Surgery [phone] [facility]

## 2020-11-30 NOTE — Progress Notes (Signed)
ANTICOAGULATION CONSULT NOTE   Pharmacy Consult for heparin Indication: recent bilateral DVT on 8/17 (home xarelto on hold)  Allergies  Allergen Reactions   Ibuprofen Anaphylaxis   Shrimp [Shellfish Allergy] Anaphylaxis    Patient Measurements: wt 83kg, height 68 inches Height: 5\' 8"  (172.7 cm) Weight: 88.3 kg (194 lb 10.7 oz) IBW/kg (Calculated) : 68.4 Heparin Dosing Weight: 83 kg  Vital Signs: Temp: 97.5 F (36.4 C) (08/27 0601) Temp Source: Oral (08/27 0601) BP: 130/75 (08/27 0601) Pulse Rate: 96 (08/27 0601)  Labs: Recent Labs    11/28/20 0044 11/28/20 0045 11/28/20 0806 11/28/20 1734 11/28/20 2204 11/29/20 0352 11/29/20 1020 11/30/20 0346  HGB  --  6.6* 7.8*   < > 10.4* 11.1*  --  9.8*  HCT  --  21.5* 24.7*   < > 33.1* 35.2*  --  31.5*  PLT  --  275  --   --  194 213  --  193  APTT 59*  --  51*  --   --   --  27  --   HEPARINUNFRC 0.78*  --  0.46  --   --   --  0.47 0.37  CREATININE  --  0.82  --   --   --  1.16  --  1.37*   < > = values in this interval not displayed.     Estimated Creatinine Clearance: 54.2 mL/min (A) (by C-G formula based on SCr of 1.37 mg/dL (H)).   Medications:  - on xarelto dose pack PTA (on 15mg  bid - last dose taken on 11/26/20 at [facility])  Assessment: Patient is a 70 y.o M with metastatic prostate cancer currently undergoing chemotherapy treatment and on xarelto PTA for bilateral DVTs that was diagnosed on 11/20/20, presented to the ED on 11/27/20 with c/o abdominal pain. Surgery and GI consulted for fecal impaction. Pharmacy has been consulted to transition patient to heparin drip in case invasive intervention is needed with this admission. Baseline aPTT 34 sec, heparin level is elevated at 0.91--> is elevated d/t effect of xarelto  Patient taken to OR on 8/25 for sigmoid colectomy and end colostomy placement due to concern for large bowel perforation in setting of fecal impaction. Heparin was held and resumed 8/26 @ 0200.  Today,  11/30/2020: Heparin level therapeutic at 0.37, however has been downtrending Given drop in hemoglobin overnight (11.1 > 9.8), will keep heparin drip rate the same and reassess daily HL tomorrow Discussed w/ RN - no issues with line running and no bleeding noted.  Discussed with CCS - continue heparin drip for now in case further procedures needed - patient is high risk for developing intra-abdominal abscess or rectal stump dehiscence  Goal of Therapy:  Heparin level 0.3-0.7 units/ml aPTT 66-102 seconds Monitor platelets by anticoagulation protocol: Yes   Plan:  Continue heparin drip at 1450 units/hr Monitor daily heparin level and CBC Monitor for s/sx bleeding  F/u ability to transition back to [place], PharmD 11/30/2020 8:15 AM

## 2020-12-01 ENCOUNTER — Inpatient Hospital Stay (HOSPITAL_COMMUNITY): Payer: Medicare Other

## 2020-12-01 DIAGNOSIS — Z7189 Other specified counseling: Secondary | ICD-10-CM

## 2020-12-01 DIAGNOSIS — Z515 Encounter for palliative care: Secondary | ICD-10-CM

## 2020-12-01 LAB — CBC WITH DIFFERENTIAL/PLATELET
Band Neutrophils: 0 %
Basophils Relative: 0 %
Blasts: NONE SEEN %
Eosinophils Relative: 0 %
HCT: 27.2 % — ABNORMAL LOW (ref 39.0–52.0)
Hemoglobin: 8.5 g/dL — ABNORMAL LOW (ref 13.0–17.0)
Lymphocytes Relative: 2 %
MCH: 29.3 pg (ref 26.0–34.0)
MCHC: 31.3 g/dL (ref 30.0–36.0)
MCV: 93.8 fL (ref 80.0–100.0)
Metamyelocytes Relative: NONE SEEN %
Monocytes Relative: 1 %
Myelocytes: 1 %
Neutrophils Relative %: 96 %
Platelets: 181 10*3/uL (ref 150–400)
Promyelocytes Relative: NONE SEEN %
RBC: 2.9 MIL/uL — ABNORMAL LOW (ref 4.22–5.81)
RDW: 18.6 % — ABNORMAL HIGH (ref 11.5–15.5)
WBC Morphology: NORMAL
WBC: 18.4 10*3/uL — ABNORMAL HIGH (ref 4.0–10.5)
nRBC: 0.3 % — ABNORMAL HIGH (ref 0.0–0.2)
nRBC: NONE SEEN /100 WBC

## 2020-12-01 LAB — TYPE AND SCREEN
ABO/RH(D): A POS
Antibody Screen: NEGATIVE
Unit division: 0
Unit division: 0
Unit division: 0
Unit division: 0

## 2020-12-01 LAB — BPAM RBC
Blood Product Expiration Date: 202209172359
Blood Product Expiration Date: 202209172359
Blood Product Expiration Date: 202209172359
Blood Product Expiration Date: 202209172359
ISSUE DATE / TIME: 202208250228
ISSUE DATE / TIME: 202208251624
Unit Type and Rh: 6200
Unit Type and Rh: 6200
Unit Type and Rh: 6200
Unit Type and Rh: 6200

## 2020-12-01 LAB — HEPARIN LEVEL (UNFRACTIONATED): Heparin Unfractionated: 0.37 IU/mL (ref 0.30–0.70)

## 2020-12-01 LAB — COMPREHENSIVE METABOLIC PANEL
ALT: 9 U/L (ref 0–44)
AST: 28 U/L (ref 15–41)
Albumin: 1.7 g/dL — ABNORMAL LOW (ref 3.5–5.0)
Alkaline Phosphatase: 87 U/L (ref 38–126)
Anion gap: 6 (ref 5–15)
BUN: 28 mg/dL — ABNORMAL HIGH (ref 8–23)
CO2: 19 mmol/L — ABNORMAL LOW (ref 22–32)
Calcium: 7.9 mg/dL — ABNORMAL LOW (ref 8.9–10.3)
Chloride: 116 mmol/L — ABNORMAL HIGH (ref 98–111)
Creatinine, Ser: 1.16 mg/dL (ref 0.61–1.24)
GFR, Estimated: >60 mL/min (ref >60)
Glucose, Bld: 82 mg/dL (ref 70–99)
Potassium: 4.2 mmol/L (ref 3.5–5.1)
Sodium: 141 mmol/L (ref 135–145)
Total Bilirubin: 0.5 mg/dL (ref 0.3–1.2)
Total Protein: 4.9 g/dL — ABNORMAL LOW (ref 6.5–8.1)

## 2020-12-01 LAB — PHOSPHORUS: Phosphorus: 3 mg/dL (ref 2.5–4.6)

## 2020-12-01 LAB — MAGNESIUM: Magnesium: 2.1 mg/dL (ref 1.7–2.4)

## 2020-12-01 MED ORDER — ACETAMINOPHEN 10 MG/ML IV SOLN
1000.0000 mg | Freq: Four times a day (QID) | INTRAVENOUS | Status: AC
  Administered 2020-12-01 – 2020-12-02 (×2): 1000 mg via INTRAVENOUS
  Filled 2020-12-01 (×5): qty 100

## 2020-12-01 NOTE — Progress Notes (Addendum)
[place] for heparin Indication: recent bilateral DVT on 8/17 (home xarelto on hold)  Allergies  Allergen Reactions   Ibuprofen Anaphylaxis   Shrimp [Shellfish Allergy] Anaphylaxis    Patient Measurements: wt 83kg, height 68 inches Height: 5\' 8"  (172.7 cm) Weight: 91.5 kg (201 lb 11.5 oz) IBW/kg (Calculated) : 68.4 Heparin Dosing Weight: 83 kg  Vital Signs: Temp: 98 F (36.7 C) (08/28 0602) Temp Source: Oral (08/28 0602) BP: 113/71 (08/28 0602) Pulse Rate: 92 (08/28 0602)  Labs: Recent Labs    11/29/20 0352 11/29/20 1020 11/30/20 0346 11/30/20 1804 12/01/20 0356  HGB 11.1*  --  9.8*  --  8.5*  HCT 35.2*  --  31.5*  --  27.2*  PLT 213  --  193  --  181  APTT  --  27  --   --   --   HEPARINUNFRC  --  0.47 0.37  --  0.37  CREATININE 1.16  --  1.37* 1.31* 1.16     Estimated Creatinine Clearance: 65 mL/min (by C-G formula based on SCr of 1.16 mg/dL).   Medications:  - on xarelto dose pack PTA (on 15mg  bid - last dose taken on 11/26/20 [facility])  Assessment: Patient is a 69 y.o M with metastatic prostate cancer currently undergoing chemotherapy treatment and on xarelto PTA for bilateral DVTs that was diagnosed on 11/20/20, presented to the ED on 11/27/20 with c/o abdominal pain. Surgery and GI consulted for fecal impaction. Pharmacy has been consulted to transition patient to heparin drip in case invasive intervention is needed with this admission. Baseline aPTT 34 sec, heparin level is elevated at 0.91--> is elevated d/t effect of xarelto  Patient taken to OR on 8/25 for sigmoid colectomy and end colostomy placement due to concern for large bowel perforation in setting of fecal impaction. Heparin was held and resumed 8/26 @ 0200.  Today, 12/01/2020: Heparin level therapeutic at 0.37 Hemoglobin steadily downtrending (11.1 >9.8 > 8.5), however this appears to be near patient's baseline. Platelets stable.  Per RN, bleeding noted in  patient's nephrostomy tubes and colostomy.    Goal of Therapy:  Heparin level 0.3-0.7 units/ml aPTT 66-102 seconds Monitor platelets by anticoagulation protocol: Yes   Plan:  HOLD heparin drip per Dr. Alfredia Ferguson Monitor for s/sx bleeding  F/u ability to transition back to [place], PharmD 12/01/2020 11:54 AM

## 2020-12-01 NOTE — Progress Notes (Signed)
Palliative care brief note  I checked in briefly on [name] today.    He indicated he just wanted to rest and was not up for conversation.  Plan remains for full aggressive care.  Plan to check back either tomorrow or Monday.    Please call if there are specific needs with which palliative care can be of assistance in the interim.  Micheline Rough, MD [place] Team [phone]  No charge note

## 2020-12-01 NOTE — Progress Notes (Addendum)
0137:Pt removed his NG tube himself. MD paged.  0222: NG tube placed. KUB xray ordered to confirm placement.  0250: KUB xray completed. MD paged.  0310: MD confirmed ok to turn back on low intermittent suction for NG tube.

## 2020-12-01 NOTE — Progress Notes (Signed)
SLP Cancellation Note  Patient Details Name: JOANATHAN AFFELDT MRN: [id number] DOB: [birth year]   Cancelled treatment:       Reason Eval/Treat Not Completed: Medical issues which prohibited therapy;Other (comment) (Patient currently NPO with NG. SLP spoke with RN and plans to follow up next date regarding his POC.)  Sonia Baller, MA, CCC-SLP Speech Therapy

## 2020-12-01 NOTE — Consult Note (Signed)
Urology Consult Note   Requesting Attending Physician:  Kerney Elbe, DO Service Providing Consult: Urology  Consulting Attending: Dr. Link Snuffer   Reason for Consult:  Gross hematuria from bilateral nephrostomy tubes  HPI: Andre Glass is seen in consultation for reasons noted above at the request of Kerney Elbe, DO for evaluation of gross hematuria from bilateral nephrostomy tubes.  This is a 70 y.o. male with Hx of metastatic PCa c/b bilateral obstructive uropathy m/w bilateral PCN's who was admitted for abdominal pain and found to have a possible malignant stricture in his bowel as well as bilateral lower extremity DVT. He subsequently perforated his bowel and was taken for emergent ex-lap w/ sigmoid colectomy and end colostomy w/ Gen Surg on 11/28/20. He was started on a Heparin gtt for his DVT's; however, he developed bleeding from his colostomy as well as bilateral gross hematuria from his PCN's in the setting of a dropping Hgb. Therefore, his anticoagulation was stopped. Urology was consulted for bilateral gross hematuria from PCN's.   Notably his PCN's were transitioned to bilateral nephroureteral stents earlier this month. They appear to have been to drainage since that time. Unclear if cap trial has been performed.    Past Medical History: Past Medical History:  Diagnosis Date   Anemia    History of kidney stones    Prostate cancer ([place])    UTI (urinary tract infection)     Past Surgical History:  Past Surgical History:  Procedure Laterality Date   COLOSTOMY N/A 11/28/2020   Procedure: COLOSTOMY;  Surgeon: Clovis Riley, MD;  Location: [facility];  Service: General;  Laterality: N/A;   IR IMAGING GUIDED PORT INSERTION  01/31/2020   IR NEPHROSTOGRAM LEFT THRU EXISTING ACCESS  11/20/2020   IR NEPHROSTOGRAM RIGHT THRU EXISTING ACCESS  11/20/2020   IR NEPHROSTOMY EXCHANGE RIGHT  10/25/2020   IR NEPHROSTOMY PLACEMENT LEFT  09/29/2020   IR NEPHROSTOMY PLACEMENT  LEFT  11/02/2020   IR NEPHROSTOMY PLACEMENT RIGHT  09/29/2020   IR NEPHROSTOMY PLACEMENT RIGHT  11/02/2020   IR URETERAL STENT PERC [place] MOD SED  11/19/2020   IR URETERAL STENT PERC [place] MOD SED  11/19/2020   LAPAROTOMY N/A 11/28/2020   Procedure: EXPLORATORY LAPAROTOMY/ SIGMOID COLECTOMY;  Surgeon: Clovis Riley, MD;  Location: [facility];  Service: General;  Laterality: N/A;   TRANSURETHRAL RESECTION OF PROSTATE N/A 11/08/2019   Procedure: TRANSURETHRAL RESECTION OF THE PROSTATE (TURP);  Surgeon: [name], MD;  Location: [facility];  Service: Urology;  Laterality: N/A;    Medication: Current Facility-Administered Medications  Medication Dose Route Frequency Provider Last Rate Last Admin   acetaminophen (OFIRMEV) IV 1,000 mg  1,000 mg Intravenous Q6H Greer Pickerel, MD       ascorbic acid (VITAMIN C) tablet 1,000 mg  1,000 mg Oral Daily Romana Juniper A, MD   1,000 mg at 11/30/20 1041   chlorhexidine (PERIDEX) 0.12 % solution 15 mL  15 mL Mouth Rinse BID Romana Juniper A, MD   15 mL at 12/01/20 1150   Chlorhexidine Gluconate Cloth 2 % PADS 6 each  6 each Topical Daily Romana Juniper A, MD   6 each at 12/01/20 1152   dextrose 5 % solution   Intravenous Continuous Raiford Noble [place], DO 75 mL/hr at 12/01/20 1123 New Bag at 12/01/20 1123   HYDROmorphone (DILAUDID) injection 0.5-1 mg  0.5-1 mg Intravenous Q1H PRN Clovis Riley, MD   1 mg at 12/01/20 1543  MEDLINE mouth rinse  15 mL Mouth Rinse q12n4p Romana Juniper A, MD   15 mL at 12/01/20 1150   mirtazapine (REMERON) tablet 7.5 mg  7.5 mg Oral QHS Romana Juniper A, MD   7.5 mg at 11/29/20 2127   multivitamin with minerals tablet 1 tablet  1 tablet Oral Daily Romana Juniper A, MD   1 tablet at 11/30/20 1041   naloxegol oxalate (MOVANTIK) tablet 12.5 mg  12.5 mg Oral Daily Romana Juniper A, MD   12.5 mg at 11/30/20 1041   ondansetron (ZOFRAN) tablet 4 mg  4 mg Oral Q6H PRN Clovis Riley, MD       Or   ondansetron [facility])  injection 4 mg  4 mg Intravenous Q6H PRN Romana Juniper A, MD   4 mg at 11/28/20 1655   piperacillin-tazobactam (ZOSYN) IVPB 3.375 g  3.375 g Intravenous Q8H Sheikh, Georgina Quint [place], DO 12.5 mL/hr at 12/01/20 1551 3.375 g at 12/01/20 1551   Plecanatide TABS 3 mg  3 mg Oral Daily Clovis Riley, MD       predniSONE (DELTASONE) tablet 5 mg  5 mg Oral Q breakfast Romana Juniper A, MD   5 mg at 11/30/20 1041   pregabalin (LYRICA) capsule 50 mg  50 mg Oral BID Romana Juniper A, MD   50 mg at 11/30/20 1041   prochlorperazine (COMPAZINE) tablet 10 mg  10 mg Oral Q6H PRN Clovis Riley, MD       sodium chloride flush (NS) 0.9 % injection 10-40 mL  10-40 mL Intracatheter PRN Clovis Riley, MD       tamsulosin (FLOMAX) capsule 0.4 mg  0.4 mg Oral Daily PRN Clovis Riley, MD       Facility-Administered Medications Ordered in Other Encounters  Medication Dose Route Frequency Provider Last Rate Last Admin   heparin lock flush 100 unit/mL  500 Units Intravenous Once [name], MD       sodium chloride flush (NS) 0.9 % injection 10 mL  10 mL Intravenous PRN [name], MD        Allergies: Allergies  Allergen Reactions   Ibuprofen Anaphylaxis   Shrimp [Shellfish Allergy] Anaphylaxis    Social History: Social History   Tobacco Use   Smoking status: Never   Smokeless tobacco: Never  Vaping Use   Vaping Use: Never used  Substance Use Topics   Alcohol use: Never   Drug use: Never    Family History Family History  Problem Relation Age of Onset   Stomach cancer Mother    Pancreatic cancer Sister    Breast cancer Neg Hx    Prostate cancer Neg Hx    Colon cancer Neg Hx     Review of Systems 10 systems were reviewed and are negative except as noted specifically in the HPI.  Objective   Vital signs in last 24 hours: BP 117/67 (BP Location: Right Arm)   Pulse 99   Temp 98 F (36.7 C) (Oral)   Resp 18   Ht 5\' 8"  (1.727 m)   Wt 91.5 kg   SpO2 97%   BMI 30.67  kg/m   Physical Exam General: NAD, resting, appropriate HEENT: [place]/AT, EOMI, MMM Pulmonary: Normal work of breathing Cardiovascular: HDS, adequate peripheral perfusion Abdomen: Soft GU: Bilateral nephroureteral stents, right nephrostomy w/ light pink tinged urine, left nephrostomy clear yellow Extremities: warm and well perfused, mitts in place Neuro: Appropriate, no focal neurological deficits  Most Recent Labs: Lab  Results  Component Value Date   WBC 18.4 (H) 12/01/2020   HGB 8.5 (L) 12/01/2020   HCT 27.2 (L) 12/01/2020   PLT 181 12/01/2020    Lab Results  Component Value Date   NA 141 12/01/2020   K 4.2 12/01/2020   CL 116 (H) 12/01/2020   CO2 19 (L) 12/01/2020   BUN 28 (H) 12/01/2020   CREATININE 1.16 12/01/2020   CALCIUM 7.9 (L) 12/01/2020   MG 2.1 12/01/2020   PHOS 3.0 12/01/2020    Lab Results  Component Value Date   INR 1.0 10/31/2019   APTT 27 11/29/2020    IMAGING: DG Abd 1 View  Result Date: 12/01/2020 CLINICAL DATA:  NG tube placement EXAM: ABDOMEN - 1 VIEW COMPARISON:  11/29/2020 FINDINGS: Enteric tube terminates in the distal gastric antrum/pyloric region. Bilateral percutaneous nephrostomy catheters. Additional catheter overlying the pelvis. Nonobstructive bowel gas pattern. IMPRESSION: Enteric tube terminates in the distal gastric antrum/pyloric region. Electronically Signed   By: Julian Hy M.D.   On: 12/01/2020 02:56    ------  Assessment:  70 y.o. male with Hx of metastatic PCa c/b bilateral obstructive uropathy m/w bilateral nephroureteral stents who is admitted after spontaneous colonic perforation s/p ex-lap, sigmoid resection, and end colostomy as well as bilateral DVT's with new onset gross hematuria from bilateral nephroureteral stents and blood per colostomy while on anticoagulation.   Overall currently hemodynamically stable. His Hgb has trended down over the past several days (8.5 <- 9.8 <- 11.1). His Cr appears stable and at  baseline (1.16).   No urologic intervention recommended for bilateral upper tract bleeding. It is likely diffuse oozing related to metastatic cancer was well is the irritation from the tubes in the setting of anticoagulation. His anticoagulation has been held today due to his down trending Hgb. Hs urine at this time appears nearly clear off blood thinners. Unlikely to have any significant blood loss per urine at this time.   If hematuria persists could consider trying to localize any bleeding w/ CT angiogram and subsequent VIR consultation w/ embolization; however, would need to consider significant risk related to toxicity to the kidneys.   Recommendations: - No acute urologic intervention needed - Hematuria appears to be resolved off of blood thinners. Unlikely to have significant blood loss in the urinary tract at this time. - Continue bilateral nephroureteral stents to drainage. Will consider cap trial once patient is more stable and closer to discharge.    Thank you for this consult. Please contact the urology consult pager with any further questions/concerns.

## 2020-12-01 NOTE — Progress Notes (Signed)
Notified Dr. Thermon Leyland about dusky stoma appearance and possible purulent drainage. Will administer IV antibiotics as ordered and continue to monitor patient.

## 2020-12-01 NOTE — Plan of Care (Signed)
  Problem: Health Behavior/Discharge Planning: Goal: Ability to manage health-related needs will improve Outcome: Progressing   Problem: Clinical Measurements: Goal: Ability to maintain clinical measurements within normal limits will improve Outcome: Progressing Goal: Will remain free from infection Outcome: Progressing Goal: Diagnostic test results will improve Outcome: Progressing   

## 2020-12-01 NOTE — Progress Notes (Signed)
PT Cancellation Note  Patient Details Name: Andre Glass MRN: [id number] DOB: April 02, 1951   Cancelled Treatment:    Reason Eval/Treat Not Completed: Medical issues which prohibited therapy Spoke with RN who reported heparin now being held 2* bleeding (hx of DVT 8/17). Also spoke with pt who declined PT eval on today 2* pain. He appears very weak. Will hold PT for now and will check back another day.     [facility] [facility]  Office: [phone] Pager: [phone]

## 2020-12-01 NOTE — Progress Notes (Signed)
PROGRESS NOTE    Andre Glass  PYP:[id number] DOB: May 19, 1950 DOA: 11/27/2020 PCP: [name], DO  Brief Narrative:  The patient is a 70 year old African-American male with a past medical history significant for but not limited to metastatic prostate cancer with resultant obstructive uropathy requiring bilateral nephrostomy tube placement with other comorbidities who presented with worsening right-sided abdominal pain that started on the morning of yesterday.  The pain was found to be in the right lower quadrant and was a sharp 10 out of 10 in severity.  Patient dates that he passed gas yesterday and had several loose bowel movements but states nothing made the pain worse or better.  He recently had a CT scan on 11/20/2020 which showed possible malignant stricture was progressive and intra-abdominal metastatic disease and extensive osseous metastatic disease as well as evidence of bilateral lower extremity DVT and pelvic veins being compressed by pelvic adenopathy.  He had right peroneal DVT and acute left femoral DVT extending through the veins of the left lower leg and vascular surgery was consulted at that time and recommended no intervention discharged on anticoagulation with Xarelto.  He is currently on amoxicillin and Trulance without relief.  He was given fleets enema without relief in the ED.  He was also noted to have a hemoglobin of 6.6 down from 7.5 a few days ago.  He was transfused 1 unit PRBCs and general surgery as well as gastroenterology were consulted and they recommended bowel regimen and no surgical intervention unless the patient did not improve however subsequently after his enema today the patien likely perforated secondary to history of colon ulcer and had rigidity and guarding consistent with peritonitis.  General surgery is taking him to the OR for emergent surgery for a laparotomy, partial colectomy and colostomy and he is postoperative day 1 for exploratory laparotomy, sigmoid  colectomy and end colostomy with Dr. Yancey Flemings on 11/28/2020.  Findings intraoperatively showed a large perforation with diffuse feculent peritonitis and extensive contamination of the peritoneal cavity with stool and pus  General surgery feels that he is at very high risk for abscess formation and they are recommending following his labs closely and consider repeating a scan in next few days.  They are recommending continuing NG tube to low intermittent suction and await viable bowel function.  He does have some colostomy output.  Wound care has been consulted and he has been getting wet-to-dry dressings twice daily and pain control with IV acetaminophen.  His electrolytes and labs are been off so we will stop his potassium supplementation and change his fluid to D5W and give him a dose of Lokelma  Today his blood count dropped a little bit and he started having some blood from his nephrostomy in his colostomy so we will hold his heparin drip.  Surgery recommending continuing NG tube to low intermittent suction.  Continue to hold heparin drip given that his hemoglobin continues to drop and now that he has some blood in his nephrostomy in his colostomy.  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 Windle Guard on 11/28/2020 laparotomies/ -Patient presents with a 3-day  history of fecal impaction secondary to prostate cancer along with his home Dilaudid -He has tried senna and MiraLAX in the past with out relief -Patient takes Movantik and flecainide and he was given Colace and senna  trial of tapwater enemas as well as lactulose enemas -GI was consulted and recommended enemas the way he takes them -After his enema today he became extremely hypotensive and had some guarding and rigidity of his abdomen and became diaphoretic -Stat KUB showed possible free air -General surgery was reconsulted and took the patient to the OR emergently -He is postoperative day 3 now and he is at very high risk for abscess formation and general surgery recommends continue to follow his labs and exam closely and consider repeating a CT scan in next few days -Wound care has been consulted -He is getting IV fluid hydration with normal saline 100 MLS per hour -General surgery recommending continue NG tube to low intermittent suction until he has reliable bowel function as they feel an ileus is expected -General surgery thinks his ostomy is tenuous but is working they are recommending continue IV antibiotics with IV Zosyn and JP drain for now; continue to monitor and may need a repeat CT scan soon -Palliative care has been consulted for goals of care discussion and appreciate their evaluation and he was not up to speaking with Dr. Domingo Cocking yesterday and plans remain for aggressive care  Prostate cancer with subsequent pain -Continue with Tylenol, hydrocodone and Dilaudid -General surgery started him on IV acetaminophen  History of DVT -Heparin drip will now be held given his drop in hemoglobin and bleeding from Nephrostomies and Colostomy bleeding   AKI Metabolic Acidosis -Patient's BUNs/creatinine went from 13/0.82 -> 17/1.16 -> 28/1.37 and it is now 28/1.16 -Patient has a mild metabolic acidosis with a CO2 of 19, anion gap of 6, chloride level of 116 -Change IV fluid hydration change to D5W at 75 mils per hour and will give a 1 L LR bolus -Avoid nephrotoxic medications, contrast dyes, hypotension renally dose medications -Repeat CMP in the a.m.  Acute on chronic anemia likely of chronic disease,  trending down further -Patient's hemoglobin/hematocrit dropped to 6.6/21.5 and he was typed and screened and transfused 1 unit of PRBCs; after 1 unit of PRBCs hemoglobin/hematocrit improved to 7.8/24.7 yesterday and he was also transfused in the OR; hemoglobin/hematocrit is now trended up to 11.1/35.2 the day before yesterday and yesterday it is 9.8/31.5 and is now trending down 8.5/27.2 -He does not have some blood coming from his nephrostomy tubes and his colostomy so we will hold his heparin drip for now -Continue monitor and trend repeat CBC this evening and in the AM   Hematuria -In the setting of being on anticoagulation -Continue to monitor for gross hematuria and may need urology consultation and will hold his anticoagulation now that he is having blood in his nephrostomy -Have asked Urology to see the patient now   Hyperkalemia -Resolved after Lokelma and fluids -Potassium is now 4.2 -Continue monitor and trend  Hypomagnesemia -Improved and resolved as magnesium was 2.1 -Continue monitor and replete as necessary  Hypernatremia -Patient sodium went from 143 and is now 47 -Started D5W at 75 mils per hour and will continue -Continue monitor and trend and repeat CMP in  Leukocytosis -In the setting of fecal impaction and perforation now -WBC trended up from 24.4 is now 28.1 yesterday but then trended down to 5.9 is now trending back up to 11.3 in setting of his surgery  and is now 20.7 and trended back down to 18.4 -Currently getting cefazolin for surgical prophylaxis and was also on p.o. Keflex 500 mg 3 times daily; also received a dose of IV Zosyn  DVT prophylaxis: Anticoagulated with a heparin drip but this will be held given bleeding  Code Status: Full code Family Communication: No family present at bedside Disposition Plan: Pending further clinical improvement and clearance by specialists  Status is: Inpatient  Remains inpatient appropriate because:Unsafe d/c plan, IV  treatments appropriate due to intensity of illness or inability to take PO, and Inpatient level of care appropriate due to severity of illness  Dispo: The patient is from: Home              Anticipated d/c is to:  TBD              Patient currently is not medically stable to d/c.   Difficult to place patient No  Consultants:  General surgery Gastroenterology Palliative care medicine Urology Will speak to his Primary Oncologist  Procedures: Emergent surgery: Procedure performed: Exploratory laparotomy, sigmoid colectomy with end colostomy  Antimicrobials: Anti-infectives (From admission, onward)    Start     Dose/Rate Route Frequency Ordered Stop   11/28/20 2230  piperacillin-tazobactam (ZOSYN) IVPB 3.375 g        3.375 g 12.5 mL/hr over 240 Minutes Intravenous Every 8 hours 11/28/20 2121     11/28/20 2215  piperacillin-tazobactam (ZOSYN) IVPB 3.375 g  Status:  Discontinued       Note to Pharmacy: Given Intraoperative   3.375 g 100 mL/hr over 30 Minutes Intravenous Every 8 hours 11/28/20 2117 11/28/20 2121   11/28/20 1730  piperacillin-tazobactam (ZOSYN) IVPB 3.375 g       Note to Pharmacy: Given Intraoperative   3.375 g 100 mL/hr over 30 Minutes Intravenous  Once 11/28/20 1729 11/28/20 1750   11/28/20 1712  piperacillin-tazobactam (ZOSYN) 3.375 GM/50ML IVPB       Note to Pharmacy: Marchia Meiers   : cabinet override      11/28/20 1712 11/28/20 1735   11/28/20 1545  ceFAZolin (ANCEF) IVPB 2g/100 mL premix        2 g 200 mL/hr over 30 Minutes Intravenous  Once 11/28/20 1538 11/28/20 1630   11/28/20 1539  ceFAZolin (ANCEF) 2-4 GM/100ML-% IVPB       Note to Pharmacy: Minor, Anneita   : cabinet override      11/28/20 1539 11/28/20 1654   11/27/20 1700  cephALEXin (KEFLEX) capsule 500 mg  Status:  Discontinued        500 mg Oral 3 times daily 11/27/20 1640 11/28/20 2117        Subjective: Seen and examined at bedside and he again pulled out his NG tube overnight and had to  be replaced again.  Now has mitten restraints.  Started having some hematuria and blood in his nephrostomy tubes in ostomy bag.  We will hold his heparin drip for now and repeat his hemoglobin this evening.  No other concerns or plans at this time  Objective: Vitals:   11/30/20 2223 12/01/20 0140 12/01/20 0602 12/01/20 1423  BP: 122/73 128/67 113/71 117/67  Pulse: 99 95 92 99  Resp: 17 17 18 18   Temp: 98.3 F (36.8 C) 97.6 F (36.4 C) 98 F (36.7 C) 98 F (36.7 C)  TempSrc: Oral Oral Oral Oral  SpO2: 96% 96% 97% 97%  Weight:   91.5 kg   Height:  Intake/Output Summary (Last 24 hours) at 12/01/2020 1656 Last data filed at 12/01/2020 1543 Gross per 24 hour  Intake 3379.74 ml  Output 1400 ml  Net 1979.74 ml    Filed Weights   11/29/20 0500 11/30/20 0500 12/01/20 0602  Weight: 87.1 kg 88.3 kg 91.5 kg   Examination: Physical Exam:  Constitutional: WN/WD chronically ill-appearing African-American male who appears uncomfortable and is confused  Eyes: Lids and conjunctivae normal, sclerae anicteric  ENMT: External Ears, Nose appear normal. Grossly normal hearing.  NG tube is in place Neck: Appears normal, supple, no cervical masses, normal ROM, no appreciable thyromegaly Respiratory: Diminished to auscultation bilaterally with coarse breath sounds, no wheezing, rales, rhonchi or crackles. Normal respiratory effort and patient is not tachypenic. No accessory muscle use.  Unlabored breathing Cardiovascular: RRR, no murmurs / rubs / gallops. S1 and S2 auscultated.  1+ lower extremity edema Abdomen: Soft, non-tender, distended secondary body habitus and has a colostomy in place with a JP drain and some watery brown stool with some mild blood. Bowel sounds positive.  GU: Deferred.  Bilateral nephrostomy tubes with some bloody drainage now Musculoskeletal: No clubbing / cyanosis of digits/nails. No joint deformity upper and lower extremities.  Skin: No rashes, lesions, ulcers. No  induration; Warm and dry.  Neurologic: CN 2-12 grossly intact with no focal deficits.Romberg sign cerebellar reflexes not assessed.  Psychiatric: Impaired judgment and insight.  He is awake alert but not fully oriented x 3. Normal mood and appropriate affect.   Data Reviewed: I have personally reviewed following labs and imaging studies  CBC: Recent Labs  Lab 11/25/20 1554 11/27/20 1035 11/27/20 1035 11/28/20 0045 11/28/20 0806 11/28/20 1812 11/28/20 2204 11/29/20 0352 11/30/20 0346 12/01/20 0356  WBC 30.0* 24.4*   < > 28.1*  --   --  5.9 11.3* 20.7* 18.4*  NEUTROABS 24.0* 21.7*  --   --   --   --   --  10.3*  --   --   HGB 7.5* 6.6*   < > 6.6*   < > 9.9* 10.4* 11.1* 9.8* 8.5*  HCT 23.9* 21.9*   < > 21.5*   < > 29.0* 33.1* 35.2* 31.5* 27.2*  MCV 90 96.1   < > 96.0  --   --  93.2 93.4 93.2 93.8  PLT 443 296   < > 275  --   --  194 213 193 181   < > = values in this interval not displayed.    Basic Metabolic Panel: Recent Labs  Lab 11/27/20 1035 11/28/20 0045 11/28/20 1734 11/28/20 1812 11/29/20 0352 11/30/20 0346 11/30/20 1804 12/01/20 0356  NA 144 141   < > 143 143 147* 141 141  K 3.0* 3.6   < > 3.7 4.5 5.8* 4.7 4.2  CL 112* 108  --   --  114* 120* 115* 116*  CO2 25 25  --   --  21* 19* 19* 19*  GLUCOSE 111* 142*  --   --  166* 167* 100* 82  BUN 19 13  --   --  17 28* 29* 28*  CREATININE 0.87 0.82  --   --  1.16 1.37* 1.31* 1.16  CALCIUM 8.8* 8.8*  --   --  8.0* 8.0* 7.8* 7.9*  MG 1.5* 2.0  --   --  2.1 2.2  --  2.1  PHOS  --   --   --   --  4.1 3.9  --  3.0   < > =  values in this interval not displayed.    GFR: Estimated Creatinine Clearance: 65 mL/min (by C-G formula based on SCr of 1.16 mg/dL). Liver Function Tests: Recent Labs  Lab 11/28/20 0045 11/29/20 0352 11/30/20 0346 11/30/20 1804 12/01/20 0356  AST 28 27 26 26 28   ALT 19 14 11 10 9   ALKPHOS 113 71 69 85 87  BILITOT 0.4 1.0 0.4 0.5 0.5  PROT 6.4* 5.2* 5.3* 5.1* 4.9*  ALBUMIN 2.1* 2.3* 2.0*  1.9* 1.7*    Recent Labs  Lab 11/27/20 1035  LIPASE 22    No results for input(s): AMMONIA in the last 168 hours. Coagulation Profile: No results for input(s): INR, PROTIME in the last 168 hours. Cardiac Enzymes: No results for input(s): CKTOTAL, CKMB, CKMBINDEX, TROPONINI in the last 168 hours. BNP (last 3 results) No results for input(s): PROBNP in the last 8760 hours. HbA1C: No results for input(s): HGBA1C in the last 72 hours. CBG: No results for input(s): GLUCAP in the last 168 hours. Lipid Profile: No results for input(s): CHOL, HDL, LDLCALC, TRIG, CHOLHDL, LDLDIRECT in the last 72 hours. Thyroid Function Tests: No results for input(s): TSH, T4TOTAL, FREET4, T3FREE, THYROIDAB in the last 72 hours. Anemia Panel: No results for input(s): VITAMINB12, FOLATE, FERRITIN, TIBC, IRON, RETICCTPCT in the last 72 hours. Sepsis Labs: Recent Labs  Lab 11/27/20 1035  LATICACIDVEN 0.8     Recent Results (from the past 240 hour(s))  Resp Panel by RT-PCR (Flu A&B, Covid) Nasopharyngeal Swab     Status: None   Collection Time: 11/27/20  1:51 PM   Specimen: Nasopharyngeal Swab; Nasopharyngeal(NP) swabs in vial transport medium  Result Value Ref Range Status   SARS Coronavirus 2 by RT PCR NEGATIVE NEGATIVE Final    Comment: (NOTE) SARS-CoV-2 target nucleic acids are NOT DETECTED.  The SARS-CoV-2 RNA is generally detectable in upper respiratory specimens during the acute phase of infection. The lowest concentration of SARS-CoV-2 viral copies this assay can detect is 138 copies/mL. A negative result does not preclude SARS-Cov-2 infection and should not be used as the sole basis for treatment or other patient management decisions. A negative result may occur with  improper specimen collection/handling, submission of specimen other than nasopharyngeal swab, presence of viral mutation(s) within the areas targeted by this assay, and inadequate number of viral copies(<138 copies/mL). A  negative result must be combined with clinical observations, patient history, and epidemiological information. The expected result is Negative.  Fact Sheet for Patients:  EntrepreneurPulse.com.au  Fact Sheet for Healthcare Providers:  IncredibleEmployment.be  This test is no t yet approved or cleared by the [place] FDA and  has been authorized for detection and/or diagnosis of SARS-CoV-2 by FDA under an Emergency Use Authorization (EUA). This EUA will remain  in effect (meaning this test can be used) for the duration of the COVID-19 declaration under Section 564(b)(1) of the Act, 21 U.S.C.section 360bbb-3(b)(1), unless the authorization is terminated  or revoked sooner.       Influenza A by PCR NEGATIVE NEGATIVE Final   Influenza B by PCR NEGATIVE NEGATIVE Final    Comment: (NOTE) The Xpert Xpress SARS-CoV-2/FLU/RSV plus assay is intended as an aid in the diagnosis of influenza from Nasopharyngeal swab specimens and should not be used as a sole basis for treatment. Nasal washings and aspirates are unacceptable for Xpert Xpress SARS-CoV-2/FLU/RSV testing.  Fact Sheet for Patients: EntrepreneurPulse.com.au  Fact Sheet for Healthcare Providers: IncredibleEmployment.be  This test is not yet approved or cleared by the  [place] Architectural technologist and has been authorized for detection and/or diagnosis of SARS-CoV-2 by FDA under an Print production planner (EUA). This EUA will remain in effect (meaning this test can be used) for the duration of the COVID-19 declaration under Section 564(b)(1) of the Act, 21 U.S.C. section 360bbb-3(b)(1), unless the authorization is terminated or revoked.  Performed at [facility], [place] [address], [place], [place] [address]   Urine Culture     Status: None   Collection Time: 11/27/20  3:25 PM   Specimen: Kidney; Urine  Result Value Ref Range Status    Specimen Description   Final    KIDNEY LEFT NEPHROSTOMY Performed at [place] [address] [place], [place] [address]    Special Requests   Final    NONE Performed at [facility], [place] [address] [place], [place] [address]    Culture   Final    NO GROWTH Performed at [facility], [place] [address] [place], [place] [address]    Report Status 11/29/2020 FINAL  Final  Urine Culture     Status: None   Collection Time: 11/27/20  3:25 PM   Specimen: Kidney; Urine  Result Value Ref Range Status   Specimen Description   Final    KIDNEY RIGHT NEPHROSTOMY Performed at [place] [address] [place], [place] [address]    Special Requests   Final    NONE Performed at [facility], [place] [address], [place], [place] [address]    Culture   Final    NO GROWTH Performed at [facility], [place] [address] [place], [place] [address]    Report Status 11/29/2020 FINAL  Final      RN Pressure Injury Documentation:     Estimated body mass index is 30.67 kg/m as calculated from the following:   Height as of this encounter: 5\' 8"  (1.727 m).   Weight as of this encounter: 91.5 kg.  Malnutrition Type:  Nutrition Problem: Increased nutrient needs Etiology: cancer and cancer related treatments  Malnutrition Characteristics:  Signs/Symptoms: estimated needs  Nutrition Interventions:  Interventions: Refer to RD note for recommendations   Radiology Studies: DG Abd 1 View  Result Date: 12/01/2020 CLINICAL DATA:  NG tube placement EXAM: ABDOMEN - 1 VIEW COMPARISON:  11/29/2020 FINDINGS: Enteric tube terminates in the distal gastric antrum/pyloric region. Bilateral percutaneous nephrostomy catheters. Additional catheter overlying the pelvis. Nonobstructive bowel gas pattern. IMPRESSION: Enteric tube terminates in the distal gastric antrum/pyloric region. Electronically Signed   By: Julian Hy M.D.   On: 12/01/2020 02:56    Scheduled Meds:  vitamin C  1,000 mg Oral Daily   chlorhexidine  15 mL Mouth Rinse BID   Chlorhexidine Gluconate Cloth  6 each Topical Daily   mouth rinse  15 mL Mouth Rinse q12n4p   mirtazapine  7.5 mg Oral QHS   multivitamin with minerals  1 tablet Oral Daily   naloxegol oxalate  12.5 mg Oral Daily   Plecanatide  3 mg Oral Daily   predniSONE  5 mg Oral Q breakfast   pregabalin  50 mg Oral BID   Continuous Infusions:  acetaminophen     dextrose 75 mL/hr at 12/01/20 1123   piperacillin-tazobactam (ZOSYN)  IV 3.375 g (12/01/20 1551)    LOS: 4 days   Kerney Elbe, DO [facility] PAGER is on [place]  If 7PM-7AM, please contact night-coverage [email or web address]

## 2020-12-01 NOTE — Progress Notes (Signed)
[name], male   DOB: 13-Dec-1950, 70 y.o.   MRN: [id number]   Acute Care Surgery Service Progress Note:    Chief Complaint/Subjective: Co of some abd soreness Raspy voice  Objective: Vital signs in last 24 hours: Temp:  [97.6 F (36.4 C)-98.3 F (36.8 C)] 98 F (36.7 C) (08/28 0602) Pulse Rate:  [92-99] 92 (08/28 0602) Resp:  [17-18] 18 (08/28 0602) BP: (113-128)/(67-73) 113/71 (08/28 0602) SpO2:  [96 %-97 %] 97 % (08/28 0602) Weight:  [91.5 kg] 91.5 kg (08/28 0602) Last BM Date: 11/29/20  Intake/Output from previous day: 08/27 0701 - 08/28 0700 In: 3379.7 [P.O.:240; I.V.:2137.4; IV Piggyback:1002.4] Out: 1580 [Urine:1075; Emesis/NG output:480; Drains:25] Intake/Output this shift: No intake/output data recorded.  Lungs: cta, nonlabored  Cardiovascular: reg  Abd: soft, expected ttp; ostomy - ischemic appearing - no air in bag but did have some flatus in the bag while I was in the room; JP - serosang; midline wound - ok.  Nephrostomy tube in place  Extremities: no edema, +SCDs  Neuro: alert, nonfocal, raspy voice; hands in mittens  Lab Results: CBC  Recent Labs    11/30/20 0346 12/01/20 0356  WBC 20.7* 18.4*  HGB 9.8* 8.5*  HCT 31.5* 27.2*  PLT 193 181    BMET Recent Labs    11/30/20 1804 12/01/20 0356  NA 141 141  K 4.7 4.2  CL 115* 116*  CO2 19* 19*  GLUCOSE 100* 82  BUN 29* 28*  CREATININE 1.31* 1.16  CALCIUM 7.8* 7.9*    LFT Hepatic Function Latest Ref Rng & Units 12/01/2020 11/30/2020 11/30/2020  Total Protein 6.5 - 8.1 g/dL 4.9(L) 5.1(L) 5.3(L)  Albumin 3.5 - 5.0 g/dL 1.7(L) 1.9(L) 2.0(L)  AST 15 - 41 U/L '28 26 26  ' ALT 0 - 44 U/L '9 10 11  ' Alk Phosphatase 38 - 126 U/L 87 85 69  Total Bilirubin 0.3 - 1.2 mg/dL 0.5 0.5 0.4   PT/INR No results for input(s): LABPROT, INR in the last 72 hours. ABG Recent Labs    11/28/20 1734 11/28/20 1812  PHART 7.194* 7.272*  HCO3 17.6* 17.7*      Studies/Results:  Anti-infectives: Anti-infectives (From admission, onward)    Start     Dose/Rate Route Frequency Ordered Stop   11/28/20 2230  piperacillin-tazobactam (ZOSYN) IVPB 3.375 g        3.375 g 12.5 mL/hr over 240 Minutes Intravenous Every 8 hours 11/28/20 2121     11/28/20 2215  piperacillin-tazobactam (ZOSYN) IVPB 3.375 g  Status:  Discontinued       Note to Pharmacy: Given Intraoperative   3.375 g 100 mL/hr over 30 Minutes Intravenous Every 8 hours 11/28/20 2117 11/28/20 2121   11/28/20 1730  piperacillin-tazobactam (ZOSYN) IVPB 3.375 g       Note to Pharmacy: Given Intraoperative   3.375 g 100 mL/hr over 30 Minutes Intravenous  Once 11/28/20 1729 11/28/20 1750   11/28/20 1712  piperacillin-tazobactam (ZOSYN) 3.375 GM/50ML IVPB       Note to Pharmacy: Marchia Meiers   : cabinet override      11/28/20 1712 11/28/20 1735   11/28/20 1545  ceFAZolin (ANCEF) IVPB 2g/100 mL premix        2 g 200 mL/hr over 30 Minutes Intravenous  Once 11/28/20 1538 11/28/20 1630   11/28/20 1539  ceFAZolin (ANCEF) 2-4 GM/100ML-% IVPB       Note to Pharmacy: Minor, Anneita   : cabinet override  11/28/20 1539 11/28/20 1654   11/27/20 1700  cephALEXin (KEFLEX) capsule 500 mg  Status:  Discontinued        500 mg Oral 3 times daily 11/27/20 1640 11/28/20 2117       Medications: Scheduled Meds:  vitamin C  1,000 mg Oral Daily   chlorhexidine  15 mL Mouth Rinse BID   Chlorhexidine Gluconate Cloth  6 each Topical Daily   mouth rinse  15 mL Mouth Rinse q12n4p   mirtazapine  7.5 mg Oral QHS   multivitamin with minerals  1 tablet Oral Daily   naloxegol oxalate  12.5 mg Oral Daily   Plecanatide  3 mg Oral Daily   predniSONE  5 mg Oral Q breakfast   pregabalin  50 mg Oral BID   Continuous Infusions:  dextrose 75 mL/hr at 11/30/20 2200   heparin 1,450 Units/hr (11/30/20 2248)   piperacillin-tazobactam (ZOSYN)  IV 3.375 g (12/01/20 0543)   PRN Meds:.HYDROmorphone (DILAUDID)  injection, ondansetron **OR** ondansetron (ZOFRAN) IV, prochlorperazine, sodium chloride flush, tamsulosin  Assessment/Plan: Patient Active Problem List   Diagnosis Date Noted   Fecal impaction ([place]) 11/27/2020   Hypokalemia 11/27/2020   Hypomagnesemia 11/27/2020   DVT of deep femoral vein, left ([place]) 11/27/2020   Abdominal pain    Severe protein-calorie malnutrition ([place]) 10/21/2020   Acute cystitis without hematuria    Anemia    Bacteremia due to Klebsiella pneumoniae    Altered mental status 10/18/2020   Myoclonus    Symptomatic anemia 10/17/2020   AKI (acute kidney injury) ([place]) 09/28/2020   Hydronephrosis 09/28/2020   Obstructive uropathy 09/28/2020   Intractable pain 01/18/2020   BPH (benign prostatic hyperplasia) 11/08/2019   Catheter-associated urinary tract infection ([place]) 09/14/2019   Drug induced constipation 07/18/2019   Goals of care, counseling/discussion 06/26/2019   Metastatic castration-resistant adenocarcinoma of prostate ([place]) 04/19/2019   s/p Procedure(s): EXPLORATORY LAPAROTOMY/ SIGMOID COLECTOMY COLOSTOMY 11/28/2020 Fecal impaction/Severe acute on chronic constipation -POD3 s/p ex lap, sigmoid colectomy, end colostomy Dr. Kae Heller 8/25 Findings of large perforation with diffuse feculent peritonitis and extensive contamination of the peritoneal cavity with stool and pus -He is high risk for abscess formation/  We will follow his labs and exam closely and consider repeat CT scan in the next few days -Continue NGT to LIWS and await reliable bowel function, monitor ostomy for viability -WOC consulted -Twice daily wet-to-dry dressing changes to midline wound -Multimodal pain control to include IV Tylenol, incentive spirometry, PT/OT -Received pRBC transfusion in the OR.  Hemoglobin responded appropriately and stable.  Has resumed heparin drip  Elevated Creatinine - resolved today Heme: drop in hgb 11.1-->9.8-->8.5 trend FEN: N.p.o., sips/chips, IVF at 100  ml/hr; hyperkalemia - managed by TRH ID: keflex. Zosyn 8/25>> VTE: heparin gtt   Metastatic prostate cancer anemia DVT, b/l LE  Disposition: needs IS, out of bed to chair, pt/ot; trend cbc, clamp NG  LOS: 4 days    Leighton Ruff. Redmond Pulling, MD, FACS General, Bariatric, & Minimally Invasive Surgery [phone] [facility]

## 2020-12-02 LAB — COMPREHENSIVE METABOLIC PANEL
ALT: 15 U/L (ref 0–44)
AST: 41 U/L (ref 15–41)
Albumin: 1.7 g/dL — ABNORMAL LOW (ref 3.5–5.0)
Alkaline Phosphatase: 116 U/L (ref 38–126)
Anion gap: 4 — ABNORMAL LOW (ref 5–15)
BUN: 27 mg/dL — ABNORMAL HIGH (ref 8–23)
CO2: 23 mmol/L (ref 22–32)
Calcium: 7.9 mg/dL — ABNORMAL LOW (ref 8.9–10.3)
Chloride: 113 mmol/L — ABNORMAL HIGH (ref 98–111)
Creatinine, Ser: 1 mg/dL (ref 0.61–1.24)
GFR, Estimated: >60 mL/min (ref >60)
Glucose, Bld: 73 mg/dL (ref 70–99)
Potassium: 3.5 mmol/L (ref 3.5–5.1)
Sodium: 140 mmol/L (ref 135–145)
Total Bilirubin: 0.9 mg/dL (ref 0.3–1.2)
Total Protein: 5.1 g/dL — ABNORMAL LOW (ref 6.5–8.1)

## 2020-12-02 LAB — CBC WITH DIFFERENTIAL/PLATELET
Band Neutrophils: 0 %
Basophils Relative: 0 %
Blasts: NONE SEEN %
Eosinophils Relative: 0 %
HCT: 28.3 % — ABNORMAL LOW (ref 39.0–52.0)
Hemoglobin: 8.9 g/dL — ABNORMAL LOW (ref 13.0–17.0)
Lymphocytes Relative: 9 %
MCH: 29.4 pg (ref 26.0–34.0)
MCHC: 31.4 g/dL (ref 30.0–36.0)
MCV: 93.4 fL (ref 80.0–100.0)
Metamyelocytes Relative: NONE SEEN %
Monocytes Relative: 2 %
Myelocytes: NONE SEEN %
Neutrophils Relative %: 89 %
Platelets: 181 10*3/uL (ref 150–400)
Promyelocytes Relative: NONE SEEN %
RBC: 3.03 MIL/uL — ABNORMAL LOW (ref 4.22–5.81)
RDW: 18.8 % — ABNORMAL HIGH (ref 11.5–15.5)
WBC Morphology: NORMAL
WBC: 21 10*3/uL — ABNORMAL HIGH (ref 4.0–10.5)
nRBC: 0.2 % (ref 0.0–0.2)
nRBC: 1 /100 WBC — ABNORMAL HIGH

## 2020-12-02 LAB — PHOSPHORUS: Phosphorus: 2.9 mg/dL (ref 2.5–4.6)

## 2020-12-02 LAB — HEPARIN LEVEL (UNFRACTIONATED)
Heparin Unfractionated: <0.1 IU/mL — ABNORMAL LOW (ref 0.30–0.70)
Heparin Unfractionated: 0.23 IU/mL — ABNORMAL LOW (ref 0.30–0.70)

## 2020-12-02 LAB — MAGNESIUM: Magnesium: 2.1 mg/dL (ref 1.7–2.4)

## 2020-12-02 MED ORDER — DOCUSATE SODIUM 100 MG PO CAPS: 100.0000 mg | ORAL_CAPSULE | Freq: Two times a day (BID) | ORAL | Status: DC

## 2020-12-02 MED ORDER — METHOCARBAMOL 1000 MG/10ML IJ SOLN
500.0000 mg | Freq: Three times a day (TID) | INTRAVENOUS | Status: DC
  Administered 2020-12-02 – 2020-12-05 (×9): 500 mg via INTRAVENOUS
  Filled 2020-12-02: qty 5
  Filled 2020-12-02 (×8): qty 500
  Filled 2020-12-02: qty 5

## 2020-12-02 MED ORDER — HEPARIN (PORCINE) 25000 UT/250ML-% IV SOLN
2000.0000 [IU]/h | INTRAVENOUS | Status: DC
  Administered 2020-12-03 (×2): 2000 [IU]/h via INTRAVENOUS
  Filled 2020-12-02 (×3): qty 250

## 2020-12-02 MED ORDER — HEPARIN (PORCINE) 25000 UT/250ML-% IV SOLN
1450.0000 [IU]/h | INTRAVENOUS | Status: DC
  Administered 2020-12-02: 1450 [IU]/h via INTRAVENOUS
  Filled 2020-12-02 (×2): qty 250

## 2020-12-02 MED ORDER — ACETAMINOPHEN 10 MG/ML IV SOLN
1000.0000 mg | Freq: Four times a day (QID) | INTRAVENOUS | Status: AC
  Administered 2020-12-02 – 2020-12-03 (×4): 1000 mg via INTRAVENOUS
  Filled 2020-12-02 (×4): qty 100

## 2020-12-02 NOTE — Progress Notes (Signed)
OT Cancellation Note  Patient Details Name: Andre Glass MRN: [id number] DOB: 08/17/1950   Cancelled Treatment:    Reason Eval/Treat Not Completed: Medical issues which prohibited therapy patient was noted to have family at bedside with increased difficulty with speaking at this time. Will check back at another time.   Jackelyn Poling OTR/L, [place] Acute [facility] Department Office# [phone] Pager# [phone]  [place] 12/02/2020, 12:22 PM

## 2020-12-02 NOTE — Progress Notes (Signed)
Pt pulled out his NGT and refused to have it reinserted. CN and NP [place] made aware. Will continue to monitor pt.

## 2020-12-02 NOTE — Progress Notes (Signed)
Daily Progress Note   Patient Name: Andre Glass       Date: 12/02/2020 DOB: [birth year]  Age: 70 y.o. MRN#: [id number] Attending Physician: Kerney Elbe, DO Primary Care Physician: [name], DO Admit Date: 11/27/2020  Reason for Consultation/Follow-up: Establishing goals of care  Subjective: I saw and examined Andre Glass today.  He was lying in bed and was able to speak to me, however, his voice is barely a whisper.  He is very frail.  I attempted to elicit his understanding of his situation, however, he was not really able to fully engage in conversation.  He does appear to understand whenever I am talking to him, but communication is difficult due to just how weak he is.  It was difficult for him at times to even keep his eyes open during my encounter with him.  I shared with him how worried I am about him and he simply nodded.  I attempted to discuss his desires for care moving forward, however, he did not appear to have the strength to follow and engage in conversation.  Length of Stay: 5  Current Medications: Scheduled Meds:   vitamin C  1,000 mg Oral Daily   chlorhexidine  15 mL Mouth Rinse BID   Chlorhexidine Gluconate Cloth  6 each Topical Daily   mouth rinse  15 mL Mouth Rinse q12n4p   mirtazapine  7.5 mg Oral QHS   multivitamin with minerals  1 tablet Oral Daily   naloxegol oxalate  12.5 mg Oral Daily   Plecanatide  3 mg Oral Daily   predniSONE  5 mg Oral Q breakfast   pregabalin  50 mg Oral BID    Continuous Infusions:  acetaminophen 1,000 mg (12/02/20 0547)   dextrose 75 mL/hr at 12/01/20 2335   piperacillin-tazobactam (ZOSYN)  IV 3.375 g (12/02/20 0545)    PRN Meds: HYDROmorphone (DILAUDID) injection, ondansetron **OR** ondansetron (ZOFRAN) IV,  prochlorperazine, sodium chloride flush, tamsulosin  Physical Exam    General: Sleepy but arouses easily, very weak with voice barely a whisper HEENT: No bruits, no goiter, no JVD Heart: Regular rate and rhythm. No murmur appreciated. Lungs: Good air movement, clear Abdomen: Soft, globally tender, multiple drains  Ext: No significant edema Skin: Warm and dry       Vital Signs: BP 136/76 (BP  Location: Right Arm)   Pulse 97   Temp 97.8 F (36.6 C) (Oral)   Resp 20   Ht 5\' 8"  (1.727 m)   Wt 92.7 kg   SpO2 98%   BMI 31.07 kg/m  SpO2: SpO2: 98 % O2 Device: O2 Device: Room Air O2 Flow Rate: O2 Flow Rate (L/min): 2 L/min  Intake/output summary:  Intake/Output Summary (Last 24 hours) at 12/02/2020 0710 Last data filed at 12/02/2020 0600 Gross per 24 hour  Intake 2122.97 ml  Output 1405 ml  Net 717.97 ml   LBM: Last BM Date: 11/30/20 Baseline Weight: Weight: 83.5 kg Most recent weight: Weight: 92.7 kg       Palliative Assessment/Data:    Flowsheet Rows    Flowsheet Row Most Recent Value  Intake Tab   Referral Department Hospitalist  Unit at Time of Referral Med/Surg Unit  Palliative Care Primary Diagnosis Sepsis/Infectious Disease  Date Notified 11/27/20  Palliative Care Type New Palliative care  Reason for referral Clarify Goals of Care  Date of Admission 11/27/20  # of days IP prior to Palliative referral 0  Clinical Assessment   Psychosocial & Spiritual Assessment   Palliative Care Outcomes        Patient Active Problem List   Diagnosis Date Noted   Fecal impaction ([place]) 11/27/2020   Hypokalemia 11/27/2020   Hypomagnesemia 11/27/2020   DVT of deep femoral vein, left ([facility]) 11/27/2020   Abdominal pain    Severe protein-calorie malnutrition ([place]) 10/21/2020   Acute cystitis without hematuria    Anemia    Bacteremia due to Klebsiella pneumoniae    Altered mental status 10/18/2020   Myoclonus    Symptomatic anemia 10/17/2020   AKI (acute kidney injury)  (Andre Glass) 09/28/2020   Hydronephrosis 09/28/2020   Obstructive uropathy 09/28/2020   Intractable pain 01/18/2020   BPH (benign prostatic hyperplasia) 11/08/2019   Catheter-associated urinary tract infection ([place]) 09/14/2019   Drug induced constipation 07/18/2019   Goals of care, counseling/discussion 06/26/2019   Metastatic castration-resistant adenocarcinoma of prostate ([place]) 04/19/2019    Palliative Care Assessment & Plan   Patient Profile: 70 y.o. male  with past medical history of metastatic prostate cancer with obstructive uropathy requiring nephrostomy tube placement, recent CT with concern for malignant stricture and intra-abdominal metastatic disease, bilateral lower extremity DVT admitted on 11/27/2020 with abdominal pain with subsequent worsening and ex lap/ostomy performed urgently yesterday.  Palliative consulted for goals of care.  Recommendations/Plan: Full code/full scope Continue current care. We will plan to follow-up tomorrow.  He appears to be very frail and weak.  I am concerned that he is not going to be able to participate in conversation regarding his own goals of care.  He does have healthcare power of attorney paperwork that designates his brother as his surrogate Media planner.  Goals of Care and Additional Recommendations: Limitations on Scope of Treatment: Full Scope Treatment  Code Status:    Code Status Orders  (From admission, onward)           Start     Ordered   11/27/20 1638  Full code  Continuous        11/27/20 1640           Code Status History     Date Active Date Inactive Code Status Order ID Comments User Context   10/18/2020 0221 10/30/2020 2104 Full Code [id number]  Eppie Gibson, MD Inpatient   09/28/2020 2319 10/05/2020 1653 Full Code [id number]  Elwyn Reach, MD  ED   01/18/2020 1135 01/19/2020 2316 Full Code [id number]  Jonnie Finner, DO Inpatient   11/08/2019 1331 11/09/2019 1732 Full Code [id number]  [name], MD  Inpatient      Advance Directive Documentation    Flowsheet Row Most Recent Value  Type of Advance Directive Healthcare Power of Attorney  Pre-existing out of facility DNR order (yellow form or pink MOST form) --  "MOST" Form in Place? --       Prognosis:  Guarded  Discharge Planning: To Be Determined  Care plan was discussed with patient, RN, Dr. Alfredia Ferguson  Thank you for allowing the Palliative Medicine Team to assist in the care of this patient.   Total Time 30 Prolonged Time Billed No      Greater than 50%  of this time was spent counseling and coordinating care related to the above assessment and plan.  Micheline Rough, MD  Please contact Palliative Medicine Team phone at [phone] for questions and concerns.

## 2020-12-02 NOTE — Progress Notes (Signed)
Events noted.  Patient known history of advanced prostate cancer receiving palliative chemotherapy.  He is a status post laparotomy and diverting colostomy after colonic perforation.  He continues to be weak and the recovering from his surgery.  His overall prognosis from a prostate cancer in general is guarded with limited life expectancy even prior to his surgery.  It is possible that he is at meaningful recovery he might be able to resume chemotherapy.  The odds of that happening is small however.  I recommended continue supportive management at this time but if he experiences decline transitioning to hospice is reasonable.  We will continue to follow his progress.

## 2020-12-02 NOTE — Progress Notes (Addendum)
SLP Cancellation Note  Patient Details Name: Andre Glass MRN: [id number] DOB: 1950/08/21   Cancelled treatment:       Reason Eval/Treat Not Completed: Medical issues which prohibited therapy;Other (comment) (per surgical PA: "Await improvement in distention before giving liquids/food from PO standpoint"). In addition, patient has been very weak. SLP will continue follow for patient readiness for PO trials.  Addendum: surgical PA secure messaged SLP to clarify recommendations which are to allow patient to have sips of clear liquids.   Sonia Baller, MA, CCC-SLP Speech Therapy

## 2020-12-02 NOTE — Evaluation (Signed)
Clinical/Bedside Swallow Evaluation Andre Glass Details  Name: Andre Glass MRN: [id number] Date of Birth: [birth year]  Today's Date: 12/02/2020 Time: SLP Start Time (ACUTE ONLY): 24 SLP Stop Time (ACUTE ONLY): 1545 SLP Time Calculation (min) (ACUTE ONLY): 15 min  Past Medical History:  Past Medical History:  Diagnosis Date   Anemia    History of kidney stones    Prostate cancer ([place])    UTI (urinary tract infection)    Past Surgical History:  Past Surgical History:  Procedure Laterality Date   COLOSTOMY N/A 11/28/2020   Procedure: COLOSTOMY;  Surgeon: Clovis Riley, MD;  Location: [facility];  Service: General;  Laterality: N/A;   IR IMAGING GUIDED PORT INSERTION  01/31/2020   IR NEPHROSTOGRAM LEFT THRU EXISTING ACCESS  11/20/2020   IR NEPHROSTOGRAM RIGHT THRU EXISTING ACCESS  11/20/2020   IR NEPHROSTOMY EXCHANGE RIGHT  10/25/2020   IR NEPHROSTOMY PLACEMENT LEFT  09/29/2020   IR NEPHROSTOMY PLACEMENT LEFT  11/02/2020   IR NEPHROSTOMY PLACEMENT RIGHT  09/29/2020   IR NEPHROSTOMY PLACEMENT RIGHT  11/02/2020   IR URETERAL STENT PERC [place] MOD SED  11/19/2020   IR URETERAL STENT PERC [place] MOD SED  11/19/2020   LAPAROTOMY N/A 11/28/2020   Procedure: EXPLORATORY LAPAROTOMY/ SIGMOID COLECTOMY;  Surgeon: Clovis Riley, MD;  Location: [facility];  Service: General;  Laterality: N/A;   TRANSURETHRAL RESECTION OF PROSTATE N/A 11/08/2019   Procedure: TRANSURETHRAL RESECTION OF THE PROSTATE (TURP);  Surgeon: [name], MD;  Location: [facility];  Service: Urology;  Laterality: N/A;   HPI:  Andre Glass is a 70 y.o. male  with PMH: metastatic prostate cancer with obstructive uropathy requiring nephrostomy tube placement, recent CT with concern for malignant stricture and intra-abdominal metastatic disease, bilateral lower extremity DVT admitted on 11/27/2020 with abdominal pain with subsequent worsening and ex lap/ostomy performed urgently yesterday. Andre Glass has been cleared by surgery for clear liquids.    Assessment / Plan / Recommendation Clinical Impression  Andre Glass presents with a suspected primary oral dysphagia which is impacted by his significant amount of weakness. Of note, he had an OP MBS on 11/18/20 which revealed mild oral dysphagia but with normal pharyngeal phase swallow function. Andre Glass had difficulty achieving adequate lip seal but was able to take small cup sips of water with SLP assistance in supporting cup. Swallow initiation was timely and no overt s/s aspiration or penetration observed. As Andre Glass is very weak and deconditioned, SLP in agreement for continuing NPO and allowing cup sips of clear liquids. Andre Glass did have questions about his voice as he is aphonic and family member reported his voice was normal prior to surgery on 8/25. SLP Visit Diagnosis: Dysphagia, unspecified (R13.10)    Aspiration Risk  Mild aspiration risk;Moderate aspiration risk;Risk for inadequate nutrition/hydration    Diet Recommendation NPO;Other (Comment) (small amounts of clear liquids when alert)   Liquid Administration via: Cup;Other (Comment) Medication Administration: Via alternative means Supervision: Staff to assist with self feeding Compensations: Slow rate;Small sips/bites Postural Changes: Seated upright at 90 degrees    Other  Recommendations Oral Care Recommendations: Oral care QID;Staff/trained caregiver to provide oral care   Follow up Recommendations Other (comment) (TBD pending GOC)      Frequency and Duration min 2x/week  1 week       Prognosis Prognosis for Safe Diet Advancement: Guarded Barriers to Reach Goals: Severity of deficits      Swallow Study   General Date of Onset: 12/02/20 HPI: Andre Glass is a 70  y.o. male  with PMH: metastatic prostate cancer with obstructive uropathy requiring nephrostomy tube placement, recent CT with concern for malignant stricture and intra-abdominal metastatic disease, bilateral lower extremity DVT admitted on 11/27/2020 with abdominal  pain with subsequent worsening and ex lap/ostomy performed urgently yesterday. Andre Glass has been cleared by surgery for clear liquids. Type of Study: Bedside Swallow Evaluation Previous Swallow Assessment: OP MBS on 8/15 Diet Prior to this Study: NPO Temperature Spikes Noted: Yes Respiratory Status: Room air History of Recent Intubation: Yes Length of Intubations (days):  (for surgery only) Date extubated: 11/28/20 Behavior/Cognition: Alert;Cooperative;Pleasant mood;Lethargic/Drowsy Oral Cavity Assessment: Dry Oral Care Completed by SLP: No Oral Cavity - Dentition: Missing dentition Vision: Functional for self-feeding Self-Feeding Abilities: Needs set up;Needs assist Andre Glass Positioning: Upright in bed Baseline Vocal Quality: Aphonic Volitional Cough: Weak Volitional Swallow: Able to elicit    Oral/Motor/Sensory Function Overall Oral Motor/Sensory Function: Within functional limits   Ice Chips     Thin Liquid Thin Liquid: Impaired Presentation: Cup Oral Phase Impairments: Reduced labial seal    Nectar Thick     Honey Thick     Puree Puree: Not tested   Solid     Solid: Not tested      Sonia Baller, MA, CCC-SLP Speech Therapy

## 2020-12-02 NOTE — Progress Notes (Signed)
ANTICOAGULATION CONSULT NOTE - Follow Up Consult  Pharmacy Consult for Heparin Indication: DVT  Allergies  Allergen Reactions   Ibuprofen Anaphylaxis   Shrimp [Shellfish Allergy] Anaphylaxis    Patient Measurements: Height: 5\' 8"  (172.7 cm) Weight: 92.7 kg (204 lb 5.9 oz) IBW/kg (Calculated) : 68.4 Heparin Dosing Weight: 83 kg  Vital Signs: Temp: 98.4 F (36.9 C) (08/29 1228) Temp Source: Oral (08/29 1228) BP: 130/74 (08/29 1228) Pulse Rate: 100 (08/29 1228)  Labs: Recent Labs    11/30/20 0346 11/30/20 1804 12/01/20 0356 12/02/20 0353  HGB 9.8*  --  8.5* 8.9*  HCT 31.5*  --  27.2* 28.3*  PLT 193  --  181 181  HEPARINUNFRC 0.37  --  0.37 <0.10*  CREATININE 1.37* 1.31* 1.16 1.00    Estimated Creatinine Clearance: 75.9 mL/min (by C-G formula based on SCr of 1 mg/dL).   Medications:  Infusions:   acetaminophen 1,000 mg (12/02/20 1302)   dextrose 75 mL/hr at 12/02/20 1625   heparin 1,450 Units/hr (12/02/20 1308)   methocarbamol (ROBAXIN) IV 500 mg (12/02/20 1440)   piperacillin-tazobactam (ZOSYN)  IV 3.375 g (12/02/20 1412)    Brief Assessment:  For full consult, see previous note from Graylin Shiver, PharmD 8/29.  70 yom admitted for abdominal pain and is s/p colectomy and colostomy on 11/28/20.  He was recently started on Xarelto prior to this admission for bilateral DVT (diagnosed on 11/20/20).  Pharmacy has been consulted to dose heparin IV perioperatively and was held for surgery, resumed on 8/26, held on 8/28 for bleeding, and resumed on 8/29.    Heparin level 0.23 - Heparin resumed at 1450, previously therapeutic on this rate, but no bolus at re-initiation d/t recent bleeding.   - No bleeding or complications reported - Heparin level result delayed significantly.  Due at 18:00, not drawn until 20:00, resulted 21:00.  Discussed timing with IV team.  Unable to draw sooner due to shift change.     Goal of Therapy:  Heparin level 0.3-0.7 units/ml Monitor  platelets by anticoagulation protocol: Yes   Plan:  Increase to heparin IV infusion at 1700 units/hr Heparin level 6 hours after rate change Daily heparin level and CBC   Gretta Arab PharmD, BCPS Clinical Pharmacist [facility] main pharmacy [phone] 12/02/2020 9:07 PM

## 2020-12-02 NOTE — Progress Notes (Signed)
PT Cancellation Note  Patient Details Name: TEAGON KRON MRN: [id number] DOB: [birth year]   Cancelled Treatment:    Reason Eval/Treat Not Completed: Medical issues which prohibited therapy, family at bedside, patient appears extremely weak, unable to speak, makes effort though. Will check back another time.    Claretha Cooper 12/02/2020, 12:14 PM [place] Pager [phone] Office [phone]

## 2020-12-02 NOTE — Progress Notes (Signed)
PROGRESS NOTE    Andre Glass  FXT:[id number] DOB: 1950/07/27 DOA: 11/27/2020 PCP: [name], DO  Brief Narrative:  The patient is a 70 year old African-American male with a past medical history significant for but not limited to metastatic prostate cancer with resultant obstructive uropathy requiring bilateral nephrostomy tube placement with other comorbidities who presented with worsening right-sided abdominal pain that started on the morning of yesterday.  The pain was found to be in the right lower quadrant and was a sharp 10 out of 10 in severity.  Patient dates that he passed gas yesterday and had several loose bowel movements but states nothing made the pain worse or better.  He recently had a CT scan on 11/20/2020 which showed possible malignant stricture was progressive and intra-abdominal metastatic disease and extensive osseous metastatic disease as well as evidence of bilateral lower extremity DVT and pelvic veins being compressed by pelvic adenopathy.  He had right peroneal DVT and acute left femoral DVT extending through the veins of the left lower leg and vascular surgery was consulted at that time and recommended no intervention discharged on anticoagulation with Xarelto.  He is currently on amoxicillin and Trulance without relief.  He was given fleets enema without relief in the ED.  He was also noted to have a hemoglobin of 6.6 down from 7.5 a few days ago.  He was transfused 1 unit PRBCs and general surgery as well as gastroenterology were consulted and they recommended bowel regimen and no surgical intervention unless the patient did not improve however subsequently after his enema today the patien likely perforated secondary to history of colon ulcer and had rigidity and guarding consistent with peritonitis.  General surgery is taking him to the OR for emergent surgery for a laparotomy, partial colectomy and colostomy and he is postoperative day 1 for exploratory laparotomy, sigmoid  colectomy and end colostomy with Dr. Yancey Flemings on 11/28/2020.  Findings intraoperatively showed a large perforation with diffuse feculent peritonitis and extensive contamination of the peritoneal cavity with stool and pus  General surgery feels that he is at very high risk for abscess formation and they are recommending following his labs closely and consider repeating a scan in next few days.  They are recommending continuing NG tube to low intermittent suction and await viable bowel function.  He does have some colostomy output.  Wound care has been consulted and he has been getting wet-to-dry dressings twice daily and pain control with IV acetaminophen.  His electrolytes and labs are been off so we will stop his potassium supplementation and change his fluid to D5W and give him a dose of Lokelma  Today his blood count dropped a little bit and he started having some blood from his nephrostomy in his colostomy so we will hold his heparin drip.  Surgery recommending continuing NG tube to low intermittent suction.  Continued to hold heparin drip given that his hemoglobin continues to drop and now that he has some blood in his nephrostomy in his colostomy.  However his hemoglobin has improved and his nephrostomy tubes are looking clear today so we will resume his heparin gtt. medical oncology evaluated and feel that his overall prognosis from process cancer General guarded with limited life expectancy.  They feel that if he is able to obtain meaningful recovery that they will resume chemotherapy however the odds of this is happening is very small.  She pulled out his NG tube again and it was not replaced.  They feel that he is  still at risk for intra-abdominal abscesses given his intraoperative findings and they are waiting for improvement in his distention before giving liquids or food from a p.o. standpoint.  Assessment & Plan:   Principal Problem:   Fecal impaction (HCC) Active Problems:   Metastatic  castration-resistant adenocarcinoma of prostate (HCC)   BPH (benign prostatic hyperplasia)   Symptomatic anemia   Hypokalemia   Hypomagnesemia   DVT of deep femoral vein, left (HCC)  Fecal impaction with acute on chronic constipation and possible stercoral ulcer with perforation resulting in peritonitis status post exploratory laparotomy, sigmoid colectomy and end colostomy by Dr. Windle Guard on 11/28/2020 laparotomies/ -Patient presents with a 3-day history of fecal impaction secondary to prostate cancer along with his home Dilaudid -He has tried senna and MiraLAX in the past with out relief -Patient takes Movantik and flecainide and he was given Colace and senna trial of tapwater enemas as well as lactulose enemas -GI was consulted and recommended enemas the way he takes them -After his enema today he became extremely hypotensive and had some guarding and rigidity of his abdomen and became diaphoretic -Stat KUB showed possible free air -General surgery was reconsulted and took the patient to the OR emergently -He is postoperative day 4 now and he is at very high risk for abscess formation and general surgery recommends continue to follow his labs and exam closely and consider repeating a CT scan in next few days -Wound care has been consulted -He is getting IV fluid hydration with normal saline 100 MLS per hour -Patient's NG tube has been removed and will hold off for now given that he continues to put out nightly -General surgery thinks his ostomy is tenuous but is working they are recommending continue IV antibiotics with IV Zosyn and JP drain for now; continue to monitor and may need a repeat CT scan soon -Palliative care has been consulted for goals of care discussion and appreciate their evaluation and he was not up to speaking with Dr. Domingo Cocking yesterday and plans remain for aggressive care.  Patient still wants full scope of care.  Prostate cancer with subsequent pain -Continue with Tylenol,  hydrocodone and Dilaudid -General surgery started him on IV acetaminophen -Medical oncology was consulted and they are recommending that if he has any meaningful recovery that they can resume chemotherapy but they feel this is small and they feel that his prognosis is guarded  History of DVT -Heparin drip was held given his drop in hemoglobin and bleeding from Nephrostomies and Colostomy bleeding but now that the bleeding is improved we will resume heparin drip  AKI Metabolic Acidosis -Patient's BUNs/creatinine went from 13/0.82 -> 17/1.16 -> 28/1.37 and it is now 28/1.16 -Patient has a mild metabolic acidosis with a CO2 of 19, anion gap of 6, chloride level of 116 -Change IV fluid hydration change to D5W at 75 mils per hour and will give a 1 L LR bolus -Avoid nephrotoxic medications, contrast dyes, hypotension renally dose medications -Repeat CMP in the a.m.  Acute on chronic anemia likely of chronic disease, trending down further -Patient's hemoglobin/hematocrit dropped to 6.6/21.5 and he was typed and screened and transfused 1 unit of PRBCs; after 1 unit of PRBCs hemoglobin/hematocrit improved to 7.8/24.7 yesterday and he was also transfused in the OR; hemoglobin/hematocrit is now trended up to 11.1/35.2 the day before yesterday and yesterday it is 9.8/31.5 and is now trending down 8.5/27.2 yesterday but is now improved to 8.9/28.3 -He does not have some blood coming from  his nephrostomy tubes and his colostomy so we will hold his heparin drip for now -Continue monitor and trend repeat CBC this evening and in the AM   Hematuria -In the setting of being on anticoagulation -Continue to monitor for gross hematuria and may need urology consultation and will hold his anticoagulation now that he is having blood in his nephrostomy -Have asked Urology to see the patient now and appreciate Dr. Gerilyn Pilgrim evaluation.  The hematuria is resolving off of blood thinners they recommend continuing bilateral  nephroureteral stents to drainage and considering Trial once patient more stable closer to discharge  Hyperkalemia -Resolved after Lokelma and fluids -Potassium is now 3.5 -Continue monitor and trend  Hypomagnesemia -Improved and resolved as magnesium was 2.1 -Continue monitor and replete as necessary  Hypernatremia -Patient sodium went from 143 and is now 47 -Started D5W at 75 mils per hour and will continue -Continue monitor and trend and repeat CMP in  Leukocytosis -In the setting of fecal impaction and perforation now -WBC trended up from 24.4 is now 28.1 yesterday but then trended down to 5.9 is now trending back up to 11.3 in setting of his surgery and is now 20.7 and trended back down to 18.4 and is now trended back up to 21.0 -Currently getting cefazolin for surgical prophylaxis and was also on p.o. Keflex 500 mg 3 times daily; also received a dose of IV Zosyn  DVT prophylaxis: Anticoagulated with a heparin drip but this will be held given bleeding  Code Status: Full code Family Communication: No family present at bedside Disposition Plan: Pending further clinical improvement and clearance by specialists  Status is: Inpatient  Remains inpatient appropriate because:Unsafe d/c plan, IV treatments appropriate due to intensity of illness or inability to take PO, and Inpatient level of care appropriate due to severity of illness  Dispo: The patient is from: Home              Anticipated d/c is to:  TBD              Patient currently is not medically stable to d/c.   Difficult to place patient No  Consultants:  General surgery Gastroenterology Palliative care medicine Urology Medical Oncology  Procedures: Emergent surgery: Procedure performed: Exploratory laparotomy, sigmoid colectomy with end colostomy  Antimicrobials: Anti-infectives (From admission, onward)    Start     Dose/Rate Route Frequency Ordered Stop   11/28/20 2230  piperacillin-tazobactam (ZOSYN) IVPB  3.375 g        3.375 g 12.5 mL/hr over 240 Minutes Intravenous Every 8 hours 11/28/20 2121     11/28/20 2215  piperacillin-tazobactam (ZOSYN) IVPB 3.375 g  Status:  Discontinued       Note to Pharmacy: Given Intraoperative   3.375 g 100 mL/hr over 30 Minutes Intravenous Every 8 hours 11/28/20 2117 11/28/20 2121   11/28/20 1730  piperacillin-tazobactam (ZOSYN) IVPB 3.375 g       Note to Pharmacy: Given Intraoperative   3.375 g 100 mL/hr over 30 Minutes Intravenous  Once 11/28/20 1729 11/28/20 1750   11/28/20 1712  piperacillin-tazobactam (ZOSYN) 3.375 GM/50ML IVPB       Note to Pharmacy: Marchia Meiers   : cabinet override      11/28/20 1712 11/28/20 1735   11/28/20 1545  ceFAZolin (ANCEF) IVPB 2g/100 mL premix        2 g 200 mL/hr over 30 Minutes Intravenous  Once 11/28/20 1538 11/28/20 1630   11/28/20 1539  ceFAZolin (ANCEF) 2-4 GM/100ML-%  IVPB       Note to Pharmacy: Minor, Anneita   : cabinet override      11/28/20 1539 11/28/20 1654   11/27/20 1700  cephALEXin (KEFLEX) capsule 500 mg  Status:  Discontinued        500 mg Oral 3 times daily 11/27/20 1640 11/28/20 2117        Subjective: Seen and examined at bedside and and he was complaining of significant right-sided leg pain.  Still had a raspy voice and did not feel as well.  Pulled out his NG tube.  No chest pain or shortness of breath.  Denies any other concerns or plans at this time.  Objective: Vitals:   12/02/20 0256 12/02/20 0439 12/02/20 1228 12/02/20 2028  BP:  136/76 130/74 136/72  Pulse:  97 100 98  Resp:  20 18 20   Temp:  97.8 F (36.6 C) 98.4 F (36.9 C) (!) 97.4 F (36.3 C)  TempSrc:  Oral Oral Oral  SpO2:  98% 97% 98%  Weight: 92.7 kg     Height:        Intake/Output Summary (Last 24 hours) at 12/02/2020 2050 Last data filed at 12/02/2020 1733 Gross per 24 hour  Intake 1238.46 ml  Output 1265 ml  Net -26.54 ml    Filed Weights   11/30/20 0500 12/01/20 0602 12/02/20 0256  Weight: 88.3 kg 91.5 kg  92.7 kg   Examination: Physical Exam:  Constitutional: WN/WD chronically ill-appearing African-American male who is uncomfortable and complaining of significant leg pain Eyes: Lids and conjunctivae normal, sclerae anicteric  ENMT: External Ears, Nose appear normal. Grossly normal hearing.  Neck: Appears normal, supple, no cervical masses, normal ROM, no appreciable thyromegaly; NG tube has been removed Respiratory: Diminished to auscultation bilaterally, no wheezing, rales, rhonchi or crackles. Normal respiratory effort and patient is not tachypenic. No accessory muscle use.  Unlabored breathing Cardiovascular: RRR, no murmurs / rubs / gallops. S1 and S2 auscultated.  1+ lower extremity edema Abdomen: Soft, non-tender, distended secondary body habitus and has a colostomy in place with a JP drain. Bowel sounds positive.  GU: Deferred.  Nephrostomies are clear and does not appear to have any bright red blood in Musculoskeletal: No clubbing / cyanosis of digits/nails. No joint deformity upper and lower extremities.  Skin: No rashes, lesions, ulcers on limited skin evaluation. No induration; Warm and dry.  Neurologic: CN 2-12 grossly intact with no focal deficits. Romberg sign and cerebellar reflexes not assessed.  Psychiatric: Normal judgment and insight.  He is awake and alert.  Anxious appearing mood and appropriate affect.   Data Reviewed: I have personally reviewed following labs and imaging studies  CBC: Recent Labs  Lab 11/27/20 1035 11/28/20 0045 11/28/20 2204 11/29/20 0352 11/30/20 0346 12/01/20 0356 12/02/20 0353  WBC 24.4*   < > 5.9 11.3* 20.7* 18.4* 21.0*  NEUTROABS 21.7*  --   --  10.3*  --   --   --   HGB 6.6*   < > 10.4* 11.1* 9.8* 8.5* 8.9*  HCT 21.9*   < > 33.1* 35.2* 31.5* 27.2* 28.3*  MCV 96.1   < > 93.2 93.4 93.2 93.8 93.4  PLT 296   < > 194 213 193 181 181   < > = values in this interval not displayed.    Basic Metabolic Panel: Recent Labs  Lab 11/28/20 0045  11/28/20 1734 11/29/20 0352 11/30/20 0346 11/30/20 1804 12/01/20 0356 12/02/20 0353  NA 141   < >  143 147* 141 141 140  K 3.6   < > 4.5 5.8* 4.7 4.2 3.5  CL 108  --  114* 120* 115* 116* 113*  CO2 25  --  21* 19* 19* 19* 23  GLUCOSE 142*  --  166* 167* 100* 82 73  BUN 13  --  17 28* 29* 28* 27*  CREATININE 0.82  --  1.16 1.37* 1.31* 1.16 1.00  CALCIUM 8.8*  --  8.0* 8.0* 7.8* 7.9* 7.9*  MG 2.0  --  2.1 2.2  --  2.1 2.1  PHOS  --   --  4.1 3.9  --  3.0 2.9   < > = values in this interval not displayed.    GFR: Estimated Creatinine Clearance: 75.9 mL/min (by C-G formula based on SCr of 1 mg/dL). Liver Function Tests: Recent Labs  Lab 11/29/20 0352 11/30/20 0346 11/30/20 1804 12/01/20 0356 12/02/20 0353  AST 27 26 26 28  41  ALT 14 11 10 9 15   ALKPHOS 71 69 85 87 116  BILITOT 1.0 0.4 0.5 0.5 0.9  PROT 5.2* 5.3* 5.1* 4.9* 5.1*  ALBUMIN 2.3* 2.0* 1.9* 1.7* 1.7*    Recent Labs  Lab 11/27/20 1035  LIPASE 22    No results for input(s): AMMONIA in the last 168 hours. Coagulation Profile: No results for input(s): INR, PROTIME in the last 168 hours. Cardiac Enzymes: No results for input(s): CKTOTAL, CKMB, CKMBINDEX, TROPONINI in the last 168 hours. BNP (last 3 results) No results for input(s): PROBNP in the last 8760 hours. HbA1C: No results for input(s): HGBA1C in the last 72 hours. CBG: No results for input(s): GLUCAP in the last 168 hours. Lipid Profile: No results for input(s): CHOL, HDL, LDLCALC, TRIG, CHOLHDL, LDLDIRECT in the last 72 hours. Thyroid Function Tests: No results for input(s): TSH, T4TOTAL, FREET4, T3FREE, THYROIDAB in the last 72 hours. Anemia Panel: No results for input(s): VITAMINB12, FOLATE, FERRITIN, TIBC, IRON, RETICCTPCT in the last 72 hours. Sepsis Labs: Recent Labs  Lab 11/27/20 1035  LATICACIDVEN 0.8     Recent Results (from the past 240 hour(s))  Resp Panel by RT-PCR (Flu A&B, Covid) Nasopharyngeal Swab     Status: None    Collection Time: 11/27/20  1:51 PM   Specimen: Nasopharyngeal Swab; Nasopharyngeal(NP) swabs in vial transport medium  Result Value Ref Range Status   SARS Coronavirus 2 by RT PCR NEGATIVE NEGATIVE Final    Comment: (NOTE) SARS-CoV-2 target nucleic acids are NOT DETECTED.  The SARS-CoV-2 RNA is generally detectable in upper respiratory specimens during the acute phase of infection. The lowest concentration of SARS-CoV-2 viral copies this assay can detect is 138 copies/mL. A negative result does not preclude SARS-Cov-2 infection and should not be used as the sole basis for treatment or other patient management decisions. A negative result may occur with  improper specimen collection/handling, submission of specimen other than nasopharyngeal swab, presence of viral mutation(s) within the areas targeted by this assay, and inadequate number of viral copies(<138 copies/mL). A negative result must be combined with clinical observations, patient history, and epidemiological information. The expected result is Negative.  Fact Sheet for Patients:  EntrepreneurPulse.com.au  Fact Sheet for Healthcare Providers:  IncredibleEmployment.be  This test is no t yet approved or cleared by the [place] FDA and  has been authorized for detection and/or diagnosis of SARS-CoV-2 by FDA under an Emergency Use Authorization (EUA). This EUA will remain  in effect (meaning this test can be used) for the duration  of the COVID-19 declaration under Section 564(b)(1) of the Act, 21 U.S.C.section 360bbb-3(b)(1), unless the authorization is terminated  or revoked sooner.       Influenza A by PCR NEGATIVE NEGATIVE Final   Influenza B by PCR NEGATIVE NEGATIVE Final    Comment: (NOTE) The Xpert Xpress SARS-CoV-2/FLU/RSV plus assay is intended as an aid in the diagnosis of influenza from Nasopharyngeal swab specimens and should not be used as a sole basis for treatment.  Nasal washings and aspirates are unacceptable for Xpert Xpress SARS-CoV-2/FLU/RSV testing.  Fact Sheet for Patients: EntrepreneurPulse.com.au  Fact Sheet for Healthcare Providers: IncredibleEmployment.be  This test is not yet approved or cleared by the [place] FDA and has been authorized for detection and/or diagnosis of SARS-CoV-2 by FDA under an Emergency Use Authorization (EUA). This EUA will remain in effect (meaning this test can be used) for the duration of the COVID-19 declaration under Section 564(b)(1) of the Act, 21 U.S.C. section 360bbb-3(b)(1), unless the authorization is terminated or revoked.  Performed at [facility], [place] [address] [place], [place] [address]   Urine Culture     Status: None   Collection Time: 11/27/20  3:25 PM   Specimen: Kidney; Urine  Result Value Ref Range Status   Specimen Description   Final    KIDNEY LEFT NEPHROSTOMY Performed at [place] [address] [place], [place] [address]    Special Requests   Final    NONE Performed at [facility], [place] [address] [place], [place] [address]    Culture   Final    NO GROWTH Performed at [facility], [place] [address], [place], [place] [address]    Report Status 11/29/2020 FINAL  Final  Urine Culture     Status: None   Collection Time: 11/27/20  3:25 PM   Specimen: Kidney; Urine  Result Value Ref Range Status   Specimen Description   Final    KIDNEY RIGHT NEPHROSTOMY Performed at [place] [address] [place], [place] [address]    Special Requests   Final    NONE Performed at [facility], [place] [address] [place], [place] [address]    Culture   Final    NO GROWTH Performed at [facility], [place] [address], [place], [place] [address]    Report Status 11/29/2020 FINAL  Final      RN Pressure Injury Documentation:      Estimated body mass index is 31.07 kg/m as calculated from the following:   Height as of this encounter: 5\' 8"  (1.727 m).   Weight as of this encounter: 92.7 kg.  Malnutrition Type:  Nutrition Problem: Increased nutrient needs Etiology: cancer and cancer related treatments  Malnutrition Characteristics:  Signs/Symptoms: estimated needs  Nutrition Interventions:  Interventions: Refer to RD note for recommendations   Radiology Studies: DG Abd 1 View  Result Date: 12/01/2020 CLINICAL DATA:  NG tube placement EXAM: ABDOMEN - 1 VIEW COMPARISON:  11/29/2020 FINDINGS: Enteric tube terminates in the distal gastric antrum/pyloric region. Bilateral percutaneous nephrostomy catheters. Additional catheter overlying the pelvis. Nonobstructive bowel gas pattern. IMPRESSION: Enteric tube terminates in the distal gastric antrum/pyloric region. Electronically Signed   By: Julian Hy M.D.   On: 12/01/2020 02:56    Scheduled Meds:  vitamin C  1,000 mg Oral Daily   chlorhexidine  15 mL Mouth Rinse BID   Chlorhexidine Gluconate Cloth  6 each Topical Daily  docusate sodium  100 mg Oral BID   mouth rinse  15 mL Mouth Rinse q12n4p   mirtazapine  7.5 mg Oral QHS   multivitamin with minerals  1 tablet Oral Daily   naloxegol oxalate  12.5 mg Oral Daily   Plecanatide  3 mg Oral Daily   predniSONE  5 mg Oral Q breakfast   pregabalin  50 mg Oral BID   Continuous Infusions:  acetaminophen 1,000 mg (12/02/20 1756)   dextrose 75 mL/hr at 12/02/20 1625   heparin 1,450 Units/hr (12/02/20 1308)   methocarbamol (ROBAXIN) IV 500 mg (12/02/20 1440)   piperacillin-tazobactam (ZOSYN)  IV 3.375 g (12/02/20 1412)    LOS: 5 days   Kerney Elbe, DO [facility] PAGER is on AMION  If 7PM-7AM, please contact night-coverage [email or web address]

## 2020-12-02 NOTE — Progress Notes (Signed)
[place] for heparin Indication: recent bilateral DVT on 8/17 (home xarelto on hold)  Allergies  Allergen Reactions   Ibuprofen Anaphylaxis   Shrimp [Shellfish Allergy] Anaphylaxis    Patient Measurements: wt 83kg, height 68 inches Height: 5\' 8"  (172.7 cm) Weight: 92.7 kg (204 lb 5.9 oz) IBW/kg (Calculated) : 68.4 Heparin Dosing Weight: 83 kg  Vital Signs: Temp: 97.8 F (36.6 C) (08/29 0439) Temp Source: Oral (08/29 0439) BP: 136/76 (08/29 0439) Pulse Rate: 97 (08/29 0439)  Labs: Recent Labs    11/30/20 0346 11/30/20 1804 12/01/20 0356 12/02/20 0353  HGB 9.8*  --  8.5* 8.9*  HCT 31.5*  --  27.2* 28.3*  PLT 193  --  181 181  HEPARINUNFRC 0.37  --  0.37 <0.10*  CREATININE 1.37* 1.31* 1.16 1.00     Estimated Creatinine Clearance: 75.9 mL/min (by C-G formula based on SCr of 1 mg/dL).   Medications:  - on xarelto dose pack PTA (on 15mg  bid - last dose taken on 11/26/20 at [facility])  Assessment: Patient is a 70 y.o M with metastatic prostate cancer currently undergoing chemotherapy treatment and on xarelto PTA for bilateral DVTs that was diagnosed on 11/20/20, presented to the ED on 11/27/20 with c/o abdominal pain. Surgery and GI consulted for fecal impaction. Pharmacy has been consulted to transition patient to heparin drip in case invasive intervention is needed with this admission. Baseline aPTT 34 sec, heparin level is elevated at 0.91--> is elevated d/t effect of xarelto  Patient taken to OR on 8/25 for sigmoid colectomy and end colostomy placement due to concern for large bowel perforation in setting of fecal impaction. Heparin was held and resumed 8/26 @ 0200. 8/28 am Heparin held for bleeding noted from nephrostomy tubes and colostomy  Today, 12/02/2020: Resume Heparin, no loading dose per MD Hemoglobin steadily downtrending (11.1 >9.8 > 8.9), however this appears to be near patient's baseline. Platelets stable.  Per MD, no  further colostomy bleed, Nephrostomy tube output pink, pt c/o leg pain   Goal of Therapy:  Heparin level 0.3-0.7 units/ml aPTT 66-102 seconds Monitor platelets by anticoagulation protocol: Yes   Plan:  Resume Heparin infusion at 1450 units/hr Check 6 hr Heparin level Monitor for s/sx bleeding  F/u ability to transition back to [place], [facility] [phone] 12/02/2020, 10:53 AM

## 2020-12-02 NOTE — Progress Notes (Deleted)
    SUBJECTIVE:   CHIEF COMPLAINT / HPI:   ***  PERTINENT  PMH / PSH: ***  OBJECTIVE:   There were no vitals taken for this visit. ***  General: NAD, pleasant, able to participate in exam Cardiac: RRR, no murmurs. Respiratory: CTAB, normal effort, No wheezes, rales or rhonchi Abdomen: Bowel sounds present, nontender, nondistended, no hepatosplenomegaly. Extremities: no edema or cyanosis. Skin: warm and dry, no rashes noted Neuro: alert, no obvious focal deficits Psych: Normal affect and mood  ASSESSMENT/PLAN:   No problem-specific Assessment & Plan notes found for this encounter.     Andre Fors, DO [facility]    {    This will disappear when note is signed, click to select method of visit    :1} 

## 2020-12-02 NOTE — Consult Note (Signed)
[facility] Nurse ostomy follow up Patient receiving care in [place]. No family present. Patient attempted to open his phone to call his niece, but he could not remember how to unlock the phone. Patient is VERY weak today. Stoma type/location: LUQ colostomy Stomal assessment/size: 2 inches, round, budded, sutures intact, black with foul smell, drops of dark bloody in existing pouch, no gas, no stool. Peristomal assessment: intact Treatment options for stomal/peristomal skin: barrier ring Output: none  Ostomy pouching: 2pc. 2 and 3/4 inches skin barrier, Kellie Simmering #2; pouch, Kellie Simmering [phone]; barrier rings, Kellie Simmering [phone] Education provided: none, patient too weak, cannot focus his attention, voice very weak--barely audible, has difficulty raising his arms Enrolled patient in [employer] Discharge program: No   He was able to tell me his niece has a newborn baby at home.  Based on the scant information I have about his residence situation, I believe it would be in his best interest to try to place him in a SNF.  Supplies at bedside.  Val Riles, RN, MSN, CWOCN, CNS-BC, pager [phone]

## 2020-12-02 NOTE — Progress Notes (Signed)
Progress Note  4 Days Post-Op  Subjective: Patient reports abdominal pain that radiates to RLE some. Patient denies significant nausea. He is putting out some gas and stool from stoma. He has not seen SLP yet but reports his voice has been weak since surgery.   Objective: Vital signs in last 24 hours: Temp:  [97.8 F (36.6 C)-98.7 F (37.1 C)] 97.8 F (36.6 C) (08/29 0439) Pulse Rate:  [97-106] 97 (08/29 0439) Resp:  [18-20] 20 (08/29 0439) BP: (117-136)/(67-76) 136/76 (08/29 0439) SpO2:  [97 %-98 %] 98 % (08/29 0439) Weight:  [92.7 kg] 92.7 kg (08/29 0256) Last BM Date: 11/30/20  Intake/Output from previous day: 08/28 0701 - 08/29 0700 In: 2123 [I.V.:1755.8; IV Piggyback:367.2] Out: 5093 [Urine:1325; Drains:30; Stool:50] Intake/Output this shift: Total I/O In: -  Out: 170 [Urine:170]  PE: General: pleasant, WD, chronically ill appearing male who is laying in bed in NAD Heart: regular, rate, and rhythm.   Lungs: CTAB, no wheezes, rhonchi, or rales noted.  Respiratory effort nonlabored Abd: soft, appropriately ttp, distended, midline dressing c/d/I, drain in RLQ with SS fluid, stoma necrotic but with some bleeding centrally and gas/stool in pouch    Lab Results:  Recent Labs    12/01/20 0356 12/02/20 0353  WBC 18.4* 21.0*  HGB 8.5* 8.9*  HCT 27.2* 28.3*  PLT 181 181   BMET Recent Labs    12/01/20 0356 12/02/20 0353  NA 141 140  K 4.2 3.5  CL 116* 113*  CO2 19* 23  GLUCOSE 82 73  BUN 28* 27*  CREATININE 1.16 1.00  CALCIUM 7.9* 7.9*   PT/INR No results for input(s): LABPROT, INR in the last 72 hours. CMP     Component Value Date/Time   NA 140 12/02/2020 0353   NA 143 11/25/2020 1554   K 3.5 12/02/2020 0353   CL 113 (H) 12/02/2020 0353   CO2 23 12/02/2020 0353   GLUCOSE 73 12/02/2020 0353   BUN 27 (H) 12/02/2020 0353   BUN 23 11/25/2020 1554   CREATININE 1.00 12/02/2020 0353   CREATININE 1.00 11/14/2020 1146   CALCIUM 7.9 (L) 12/02/2020 0353    PROT 5.1 (L) 12/02/2020 0353   ALBUMIN 1.7 (L) 12/02/2020 0353   AST 41 12/02/2020 0353   AST 35 11/14/2020 1146   ALT 15 12/02/2020 0353   ALT 45 (H) 11/14/2020 1146   ALKPHOS 116 12/02/2020 0353   BILITOT 0.9 12/02/2020 0353   BILITOT 0.3 11/14/2020 1146   GFRNONAA >60 12/02/2020 0353   GFRNONAA >60 11/14/2020 1146   GFRAA >60 11/29/2019 1306   Lipase     Component Value Date/Time   LIPASE 22 11/27/2020 1035       Studies/Results: DG Abd 1 View  Result Date: 12/01/2020 CLINICAL DATA:  NG tube placement EXAM: ABDOMEN - 1 VIEW COMPARISON:  11/29/2020 FINDINGS: Enteric tube terminates in the distal gastric antrum/pyloric region. Bilateral percutaneous nephrostomy catheters. Additional catheter overlying the pelvis. Nonobstructive bowel gas pattern. IMPRESSION: Enteric tube terminates in the distal gastric antrum/pyloric region. Electronically Signed   By: Julian Hy M.D.   On: 12/01/2020 02:56    Anti-infectives: Anti-infectives (From admission, onward)    Start     Dose/Rate Route Frequency Ordered Stop   11/28/20 2230  piperacillin-tazobactam (ZOSYN) IVPB 3.375 g        3.375 g 12.5 mL/hr over 240 Minutes Intravenous Every 8 hours 11/28/20 2121     11/28/20 2215  piperacillin-tazobactam (ZOSYN) IVPB 3.375 g  Status:  Discontinued       Note to Pharmacy: Given Intraoperative   3.375 g 100 mL/hr over 30 Minutes Intravenous Every 8 hours 11/28/20 2117 11/28/20 2121   11/28/20 1730  piperacillin-tazobactam (ZOSYN) IVPB 3.375 g       Note to Pharmacy: Given Intraoperative   3.375 g 100 mL/hr over 30 Minutes Intravenous  Once 11/28/20 1729 11/28/20 1750   11/28/20 1712  piperacillin-tazobactam (ZOSYN) 3.375 GM/50ML IVPB       Note to Pharmacy: Marchia Meiers   : cabinet override      11/28/20 1712 11/28/20 1735   11/28/20 1545  ceFAZolin (ANCEF) IVPB 2g/100 mL premix        2 g 200 mL/hr over 30 Minutes Intravenous  Once 11/28/20 1538 11/28/20 1630   11/28/20  1539  ceFAZolin (ANCEF) 2-4 GM/100ML-% IVPB       Note to Pharmacy: Minor, Anneita   : cabinet override      11/28/20 1539 11/28/20 1654   11/27/20 1700  cephALEXin (KEFLEX) capsule 500 mg  Status:  Discontinued        500 mg Oral 3 times daily 11/27/20 1640 11/28/20 2117        Assessment/Plan Fecal impaction/Severe acute on chronic constipation POD4 s/p ex lap, sigmoid colectomy, end colostomy Dr. Kae Heller 8/25 Findings of large perforation with diffuse feculent peritonitis and extensive contamination of the peritoneal cavity with stool and pus - He is high risk for abscess formation/  We will follow his labs and exam closely and consider repeat CT scan in the next few days - NGT out, ok to trial sips of clears from surgical standpoint if cleared by SLP - WOC consulted, stoma is necrotic but appears to be viable above the fascia  - Twice daily wet-to-dry dressing changes to midline wound - Multimodal pain control to include IV Tylenol, incentive spirometry, PT/OT - mobilize as able    FEN: Ok to have ice chips and sips of clears from surgical standpoint, IVF at 75 ml/hr per TRH ID: keflex. Zosyn 8/25>> VTE: heparin gtt   Metastatic prostate cancer ABL anemia on anemia of chronic disease - hgb 8.9, stable DVT, b/l LE    LOS: 5 days    Norm Parcel, [facility] 12/02/2020, 8:33 AM Please see Amion for pager number during day hours 7:00am-4:30pm

## 2020-12-03 ENCOUNTER — Ambulatory Visit: Payer: Medicare Other

## 2020-12-03 DIAGNOSIS — R531 Weakness: Secondary | ICD-10-CM

## 2020-12-03 LAB — PHOSPHORUS: Phosphorus: 2.4 mg/dL — ABNORMAL LOW (ref 2.5–4.6)

## 2020-12-03 LAB — COMPREHENSIVE METABOLIC PANEL
ALT: 18 U/L (ref 0–44)
AST: 36 U/L (ref 15–41)
Albumin: 1.5 g/dL — ABNORMAL LOW (ref 3.5–5.0)
Alkaline Phosphatase: 119 U/L (ref 38–126)
Anion gap: 9 (ref 5–15)
BUN: 22 mg/dL (ref 8–23)
CO2: 19 mmol/L — ABNORMAL LOW (ref 22–32)
Calcium: 7.4 mg/dL — ABNORMAL LOW (ref 8.9–10.3)
Chloride: 108 mmol/L (ref 98–111)
Creatinine, Ser: 0.82 mg/dL (ref 0.61–1.24)
GFR, Estimated: >60 mL/min (ref >60)
Glucose, Bld: 85 mg/dL (ref 70–99)
Potassium: 2.9 mmol/L — ABNORMAL LOW (ref 3.5–5.1)
Sodium: 136 mmol/L (ref 135–145)
Total Bilirubin: 1 mg/dL (ref 0.3–1.2)
Total Protein: 4.8 g/dL — ABNORMAL LOW (ref 6.5–8.1)

## 2020-12-03 LAB — SURGICAL PATHOLOGY

## 2020-12-03 LAB — CBC
HCT: 26.9 % — ABNORMAL LOW (ref 39.0–52.0)
Hemoglobin: 8.6 g/dL — ABNORMAL LOW (ref 13.0–17.0)
MCH: 29.6 pg (ref 26.0–34.0)
MCHC: 32 g/dL (ref 30.0–36.0)
MCV: 92.4 fL (ref 80.0–100.0)
Platelets: 258 10*3/uL (ref 150–400)
RBC: 2.91 MIL/uL — ABNORMAL LOW (ref 4.22–5.81)
RDW: 18.7 % — ABNORMAL HIGH (ref 11.5–15.5)
WBC: 21.8 10*3/uL — ABNORMAL HIGH (ref 4.0–10.5)
nRBC: 0.2 % (ref 0.0–0.2)

## 2020-12-03 LAB — MAGNESIUM: Magnesium: 2 mg/dL (ref 1.7–2.4)

## 2020-12-03 LAB — HEPARIN LEVEL (UNFRACTIONATED)
Heparin Unfractionated: 0.2 IU/mL — ABNORMAL LOW (ref 0.30–0.70)
Heparin Unfractionated: 0.44 IU/mL (ref 0.30–0.70)
Heparin Unfractionated: 0.44 IU/mL (ref 0.30–0.70)

## 2020-12-03 MED ORDER — POTASSIUM CHLORIDE 10 MEQ/100ML IV SOLN
10.0000 meq | INTRAVENOUS | Status: AC
Start: 2020-12-03 — End: 2020-12-03
  Administered 2020-12-03 (×4): 10 meq via INTRAVENOUS
  Filled 2020-12-03: qty 100

## 2020-12-03 MED ORDER — POTASSIUM CHLORIDE CRYS ER 20 MEQ PO TBCR: 40.0000 meq | EXTENDED_RELEASE_TABLET | Freq: Two times a day (BID) | ORAL | Status: DC

## 2020-12-03 MED ORDER — K PHOS MONO-SOD PHOS DI & MONO 155-852-130 MG PO TABS: 500.0000 mg | ORAL_TABLET | Freq: Once | ORAL | Status: DC

## 2020-12-03 MED ORDER — KCL IN DEXTROSE-NACL 40-5-0.9 MEQ/L-%-% IV SOLN
INTRAVENOUS | Status: DC
  Filled 2020-12-03 (×3): qty 1000

## 2020-12-03 MED ORDER — ACETAMINOPHEN 160 MG/5ML PO SOLN
650.0000 mg | Freq: Four times a day (QID) | ORAL | Status: DC
  Administered 2020-12-03 – 2020-12-20 (×53): 650 mg via ORAL
  Filled 2020-12-03 (×56): qty 20.3

## 2020-12-03 MED ORDER — OXYCODONE HCL 5 MG/5ML PO SOLN
5.0000 mg | ORAL | Status: DC | PRN
  Administered 2020-12-04: 5 mg via ORAL
  Administered 2020-12-05: 10 mg via ORAL
  Administered 2020-12-06: 5 mg via ORAL
  Administered 2020-12-10 – 2020-12-20 (×14): 10 mg via ORAL
  Filled 2020-12-03 (×11): qty 10
  Filled 2020-12-03 (×2): qty 5
  Filled 2020-12-03 (×5): qty 10

## 2020-12-03 MED ORDER — POTASSIUM PHOSPHATES 15 MMOLE/5ML IV SOLN
10.0000 mmol | Freq: Once | INTRAVENOUS | Status: AC
  Administered 2020-12-03: 10 mmol via INTRAVENOUS
  Filled 2020-12-03: qty 3.33

## 2020-12-03 NOTE — Progress Notes (Signed)
ANTICOAGULATION CONSULT NOTE   Pharmacy Consult for heparin Indication: recent bilateral DVT on 8/17 (home xarelto on hold)  Allergies  Allergen Reactions   Ibuprofen Anaphylaxis   Shrimp [Shellfish Allergy] Anaphylaxis    Patient Measurements: wt 83kg, height 68 inches Height: 5\' 8"  (172.7 cm) Weight: 90.4 kg (199 lb 4.7 oz) IBW/kg (Calculated) : 68.4 Heparin Dosing Weight: 83 kg  Vital Signs: Temp: 97.7 F (36.5 C) (08/30 0518) BP: 129/71 (08/30 0518) Pulse Rate: 92 (08/30 0518)  Labs: Recent Labs    12/01/20 0356 12/02/20 0353 12/02/20 2000 12/03/20 0309 12/03/20 0842 12/03/20 1048  HGB 8.5* 8.9*  --  8.6*  --   --   HCT 27.2* 28.3*  --  26.9*  --   --   PLT 181 181  --  258  --   --   HEPARINUNFRC 0.37 <0.10* 0.23* 0.20*  --  0.44  CREATININE 1.16 1.00  --   --  0.82  --      Estimated Creatinine Clearance: 91.5 mL/min (by C-G formula based on SCr of 0.82 mg/dL).   Medications:  - on xarelto dose pack PTA (on 15mg  bid - last dose taken on 11/26/20 at [facility])  Assessment: Patient is a 70 y.o M with metastatic prostate cancer currently undergoing chemotherapy treatment and on xarelto PTA for bilateral DVTs that was diagnosed on 11/20/20, presented to the ED on 11/27/20 with c/o abdominal pain. Surgery and GI consulted for fecal impaction. Pharmacy has been consulted to transition patient to heparin drip in case invasive intervention is needed with this admission. Baseline aPTT 34 sec, heparin level is elevated at 0.91--> is elevated d/t effect of xarelto  Patient taken to OR on 8/25 for sigmoid colectomy and end colostomy placement due to concern for large bowel perforation in setting of fecal impaction. Heparin was held and resumed 8/26 @ 0200. 8/28 am Heparin held for bleeding noted from nephrostomy tubes and colostomy  Today, 12/03/2020: 1100 Hep level 0.44, mid therapeutic range Hemoglobin steadily downtrending (11.1 >9.8 > 8.9), however this appears to be near  patient's baseline. Platelets stable.  Per MD, no further colostomy bleed, Nephrostomy tube output pink, pt c/o leg pain   Goal of Therapy:  Heparin level 0.3-0.7 units/ml aPTT 66-102 seconds Monitor platelets by anticoagulation protocol: Yes   Plan:  Continue Heparin infusion at 2000 units/hr Check confirmatory Heparin level at 8pm per IV team draw Monitor for s/sx bleeding  F/u ability to transition back to Laurita Quint PharmD [facility] Rx [id number] 12/03/2020, 1:07 PM

## 2020-12-03 NOTE — TOC Progression Note (Signed)
Transition of Care [facility]) - Progression Note    Patient Details  Name: ZACARIAH BELUE MRN: [id number] Date of Birth: [birth year]  Transition of Care [facility]) CM/SW Contact  Mell Mellott, Juliann Pulse, RN Phone Number: 12/03/2020, 11:34 AM  Clinical Narrative: spoke to patient's brother Legrand Como about d/c plans-informed him of [name] no longer following for [facility] services-voiced understanding.Will await PT/OT recc. Noted palliative care following.      Expected Discharge Plan: [place] Barriers to Discharge: Continued Medical Work up  Expected Discharge Plan and Services Expected Discharge Plan: [place]   Discharge Planning Services: CM Consult   Living arrangements for the past 2 months: Single Family Home                                       Social Determinants of Health (SDOH) Interventions    Readmission Risk Interventions Readmission Risk Prevention Plan 10/02/2020  Transportation Screening Complete  PCP or Specialist Appt within 5-7 Days Complete  Home Care Screening Complete  Medication Review (RN CM) Complete  Some recent data might be hidden

## 2020-12-03 NOTE — Progress Notes (Signed)
ANTICOAGULATION CONSULT NOTE   Pharmacy Consult for heparin Indication: recent bilateral DVT on 8/17 (home xarelto on hold)  Allergies  Allergen Reactions   Ibuprofen Anaphylaxis   Shrimp [Shellfish Allergy] Anaphylaxis    Patient Measurements: wt 83kg, height 68 inches Height: 5\' 8"  (172.7 cm) Weight: 92.7 kg (204 lb 5.9 oz) IBW/kg (Calculated) : 68.4 Heparin Dosing Weight: 83 kg  Vital Signs: Temp: 97.4 F (36.3 C) (08/29 2028) Temp Source: Oral (08/29 2028) BP: 136/72 (08/29 2028) Pulse Rate: 98 (08/29 2028)  Labs: Recent Labs    11/30/20 1804 12/01/20 0356 12/01/20 0356 12/02/20 0353 12/02/20 2000 12/03/20 0309  HGB  --  8.5*   < > 8.9*  --  8.6*  HCT  --  27.2*  --  28.3*  --  26.9*  PLT  --  181  --  181  --  258  HEPARINUNFRC  --  0.37   < > <0.10* 0.23* 0.20*  CREATININE 1.31* 1.16  --  1.00  --   --    < > = values in this interval not displayed.     Estimated Creatinine Clearance: 75.9 mL/min (by C-G formula based on SCr of 1 mg/dL).   Medications:  - on xarelto dose pack PTA (on 15mg  bid - last dose taken on 11/26/20 at [facility])  Assessment: Patient is a 70 y.o M with metastatic prostate cancer currently undergoing chemotherapy treatment and on xarelto PTA for bilateral DVTs that was diagnosed on 11/20/20, presented to the ED on 11/27/20 with c/o abdominal pain. Surgery and GI consulted for fecal impaction. Pharmacy has been consulted to transition patient to heparin drip in case invasive intervention is needed with this admission. Baseline aPTT 34 sec, heparin level is elevated at 0.91--> is elevated d/t effect of xarelto  Patient taken to OR on 8/25 for sigmoid colectomy and end colostomy placement due to concern for large bowel perforation in setting of fecal impaction. Heparin was held and resumed 8/26 @ 0200. 8/28 am Heparin held for bleeding noted from nephrostomy tubes and colostomy.  Resumed 8/29.  Today, 12/03/2020: Heparin level 0.2 - subtherapeutic  on IV heparin 1700 units/hr despite rate increase Hemoglobin steadily downtrending (11.1 >9.8 > 8.9> 8.6), however this appears to be near patient's baseline. Platelets stable.  No bleeding or infusion related concerns per RN   Goal of Therapy:  Heparin level 0.3-0.7 Monitor platelets by anticoagulation protocol: Yes   Plan:  Increase Heparin infusion to 2000 units/hr Check 6 hr Heparin level Monitor for s/sx bleeding  F/u ability to transition back to [place], PharmD, BCPS [facility] Rx [id number] 12/03/2020, 3:56 AM

## 2020-12-03 NOTE — Progress Notes (Signed)
Palliative Care Progress Note, Assessment & Plan   Patient Name: Andre Glass       Date: 12/03/2020 DOB: 1950-08-22  Age: 70 y.o. MRN#: [id number] Attending Physician: Kerney Elbe, DO Primary Care Physician: [name], DO Admit Date: 11/27/2020  Reason for Consultation/Follow-up: Establishing goals of care  Subjective: Patient is lying in bed in NAD. He has mild grimacing and wincing. He c/o pain in his stomach. While we spoke surgical MD White examined the patient's abdomen.   HPI: Patient is a 70 y.o. 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 He subsequently had obstructive uropathy which required bilateral nephrostomy tube placement on 6/26 with replacement on 7/30 . Pt presented to ED on 8/25 with 10/10 abdominal pain on RLQ.    When compared to CT on 8/17, CT in ED revealed possible malignant stricture was progressive and intra-abdominal metastatic disease and extensive osseous metastatic disease as well as evidence of bilateral lower extremity DVT and pelvic veins being compressed by pelvic adenopathy.   Patient also presented with a fecal impaction and s/p edema likely perforated. General surgery performed ex lap with sigmoid colectomy and end colostomy. Findings intraoperatively showed a large perforation with diffuse feculent peritonitis and extensive contamination of the peritoneal cavity with stool and pus.  Today marks POD5. Pt is not tolerating swallowing medications and had mild nausea overnight. He no longer has the NG tube and oral is poor.    He is at risk for intra-abdominal abscesses and general surgery plans to repeat CT in a few days.   Plan of Care: I have reviewed medical  records including EPIC notes, labs and imaging, received report from bedside RN Jerene Pitch, assessed the patient with Dr. Dema Severin and then met with patient at bedside to discuss diagnosis, prognosis, GOC, and symptoms management.  I introduced myself and Palliative Medicine as specialized medical care for people living with serious illness. It focuses on providing relief from the symptoms and stress of a serious illness. The goal is to improve quality of life for both the patient and the family.  As far as functional status, patient is too weak to participate with PT/OT.  As far as nutritional status, he is not taking in liquids. He is not able to swallow pills but reports he can tolerate ice chips and sips. I shared my concern regarding his nutritional status being poor. I conveyed that we will await SLP evaluation before moving forward with swallowing more liquids/food. With surgery at bedside, we agreed to move forward with pain control as liquid POs while also keeping the IV pain medications available. I shared this advancement of pain medications with bedside RN.   We discussed patient's current illness and what it means in the larger context of patient's on-going co-morbidities. He is aware of the multiple health issues he is facing. He did not want to change plan of care or code status today.   Discussed with patient/family the importance of continued conversation with family and the medical providers regarding overall plan of care and treatment options, ensuring decisions are within the context of the patient's values and GOCs.  Questions and concerns were addressed. I will continue to follow [name].   Code Status: Full code  Prognosis:  Unable to determine Guarded.  Discharge Planning: To Be Determined  Recommendations/Plan: SLP evaluation - ideally convert PO medications to liquids but need to await SLP recommendations Continue current care and patient remains Full Code Evaluate  nutritional status and consider ? TPN,is plan remains for full scope of aggressive medical treatments  Care plan was discussed with Dr. Norwood Levo from Surgery.   Length of Stay: 6  Physical Exam Vitals and nursing note reviewed.  Constitutional:      Appearance: He is ill-appearing.  HENT:     Head: Normocephalic.  Abdominal:     Palpations: Abdomen is soft.     Tenderness: There is abdominal tenderness in the right lower quadrant.     Comments: Ostomy with scant output, clean, dry central abdominal dressing intact  Skin:    General: Skin is warm and dry.  Neurological:     Mental Status: He is alert.  Psychiatric:        Behavior: Behavior normal.     Comments: Patient whispers and speaks in short phrases - appears pain induced            Vital Signs: BP 129/71 (BP Location: Left Arm)   Pulse 92   Temp 97.7 F (36.5 C)   Resp 20   Ht _0  (1.727 m)   Wt 90.4 kg   SpO2 96%   BMI 30.30 kg/m  SpO2: SpO2: 96 % O2 Device: O2 Device: Room Air O2 Flow Rate: O2 Flow Rate (L/min): 2 L/min      Palliative Assessment/Data: 30%    Flowsheet Rows    Flowsheet Row Most Recent Value  Intake Tab   Referral Department Hospitalist  Unit at Time of Referral Med/Surg Unit  Palliative Care Primary Diagnosis Sepsis/Infectious Disease  Date Notified 11/27/20  Palliative Care Type New Palliative care  Reason for referral Clarify Goals of Care  Date of Admission 11/27/20  # of days IP prior to Palliative referral 0  Clinical Assessment   Psychosocial & Spiritual Assessment   Palliative Care Outcomes         Total Time 35 minutes Prolonged Time Billed  no   Greater than 50%  of this time was spent counseling and coordinating care related to the above assessment and plan.  Thank you for allowing the Palliative Medicine Team to assist in the care of this patient. Patient seen along with Curt Bears L. Strup, DNP, FNP-BC.  Loistine Chance MD  Palliative Medicine Team Team Phone #  [phone]

## 2020-12-03 NOTE — Progress Notes (Signed)
Daily Progress Note   Patient Name: Andre Glass       Date: 12/03/2020 DOB: 1950-09-05  Age: 70 y.o. MRN#: [id number] Attending Physician: Kerney Elbe, DO Primary Care Physician: [name], DO Admit Date: 11/27/2020  Reason for Consultation/Follow-up: Establishing goals of care  Subjective: I saw and examined Andre Glass today.    He was lying in bed in no distress.  He is still weak and frail, however, his voice is stronger than it was yesterday.  His brother, [name], and another woman whom he identifies as his caregiver are present at the bedside as well.  We had a long talk discussing multiple issues Andre Glass is facing including bowel resection with high risk for abscess formation, DVT, bleeding requiring stopping of heparin, and underlying prostate cancer.  We discussed his hope for continued improvement as well as my concern that he has multiple comorbidities and high risk for continued decline.  We discussed difference between aggressive care plan and more palliative, comfort focused care plan.  We also discussed limitations of care including CODE STATUS.  His brother, [name], seems surprised to learn how sick Andre Glass truly is.  Both [name] and Andre Glass expressed desire to continue with aggressive care plan.  They were appreciative of update.  Length of Stay: 6  Current Medications: Scheduled Meds:   vitamin C  1,000 mg Oral Daily   chlorhexidine  15 mL Mouth Rinse BID   Chlorhexidine Gluconate Cloth  6 each Topical Daily   docusate sodium  100 mg Oral BID   mouth rinse  15 mL Mouth Rinse q12n4p   mirtazapine  7.5 mg Oral QHS   multivitamin with minerals  1 tablet Oral Daily   naloxegol oxalate  12.5 mg Oral Daily   Plecanatide  3 mg Oral Daily   predniSONE  5 mg Oral Q  breakfast   pregabalin  50 mg Oral BID    Continuous Infusions:  dextrose 75 mL/hr at 12/03/20 0309   heparin 2,000 Units/hr (12/03/20 0421)   methocarbamol (ROBAXIN) IV 500 mg (12/03/20 0521)   piperacillin-tazobactam (ZOSYN)  IV 3.375 g (12/03/20 0526)    PRN Meds: HYDROmorphone (DILAUDID) injection, ondansetron **OR** ondansetron (ZOFRAN) IV, prochlorperazine, sodium chloride flush, tamsulosin  Physical Exam    General: Awake and alert.  Weak voice,  but able to speak better than yesterday. HEENT: No bruits, no goiter, no JVD Heart: Regular rate and rhythm. No murmur appreciated. Lungs: Good air movement, clear Abdomen: Soft, globally tender, multiple drains  Ext: No significant edema Skin: Warm and dry       Vital Signs: BP 129/71 (BP Location: Left Arm)   Pulse 92   Temp 97.7 F (36.5 C)   Resp 20   Ht 5\' 8"  (1.727 m)   Wt 90.4 kg   SpO2 96%   BMI 30.30 kg/m  SpO2: SpO2: 96 % O2 Device: O2 Device: Room Air O2 Flow Rate: O2 Flow Rate (L/min): 2 L/min  Intake/output summary:  Intake/Output Summary (Last 24 hours) at 12/03/2020 0721 Last data filed at 12/03/2020 0600 Gross per 24 hour  Intake 1852.3 ml  Output 1400 ml  Net 452.3 ml    LBM: Last BM Date: 12/02/20 Baseline Weight: Weight: 83.5 kg Most recent weight: Weight: 90.4 kg       Palliative Assessment/Data:    Flowsheet Rows    Flowsheet Row Most Recent Value  Intake Tab   Referral Department Hospitalist  Unit at Time of Referral Med/Surg Unit  Palliative Care Primary Diagnosis Sepsis/Infectious Disease  Date Notified 11/27/20  Palliative Care Type New Palliative care  Reason for referral Clarify Goals of Care  Date of Admission 11/27/20  # of days IP prior to Palliative referral 0  Clinical Assessment   Psychosocial & Spiritual Assessment   Palliative Care Outcomes        Patient Active Problem List   Diagnosis Date Noted   Fecal impaction ([place]) 11/27/2020   Hypokalemia 11/27/2020    Hypomagnesemia 11/27/2020   DVT of deep femoral vein, left ([place]) 11/27/2020   Abdominal pain    Severe protein-calorie malnutrition ([place]) 10/21/2020   Acute cystitis without hematuria    Anemia    Bacteremia due to Klebsiella pneumoniae    Altered mental status 10/18/2020   Myoclonus    Symptomatic anemia 10/17/2020   AKI (acute kidney injury) ([place]) 09/28/2020   Hydronephrosis 09/28/2020   Obstructive uropathy 09/28/2020   Intractable pain 01/18/2020   BPH (benign prostatic hyperplasia) 11/08/2019   Catheter-associated urinary tract infection ([place]) 09/14/2019   Drug induced constipation 07/18/2019   Goals of care, counseling/discussion 06/26/2019   Metastatic castration-resistant adenocarcinoma of prostate ([place]) 04/19/2019    Palliative Care Assessment & Plan   Patient Profile: 70 y.o. male  with past medical history of metastatic prostate cancer with obstructive uropathy requiring nephrostomy tube placement, recent CT with concern for malignant stricture and intra-abdominal metastatic disease, bilateral lower extremity DVT admitted on 11/27/2020 with abdominal pain with subsequent worsening and ex lap/ostomy performed urgently yesterday.  Palliative consulted for goals of care.  Recommendations/Plan: Full code/full scope I was able to speak today with Andre Glass in conjunction with his brother/HCPOA, [name].  He remains invested for continuation of aggressive interventions. Palliative care will continue to follow peripherally.  Please call if there are palliative specific needs with which we can be of assistance in the care of Andre Glass.  Goals of Care and Additional Recommendations: Limitations on Scope of Treatment: Full Scope Treatment  Code Status:    Code Status Orders  (From admission, onward)           Start     Ordered   11/27/20 1638  Full code  Continuous        11/27/20 1640  Code Status History     Date Active Date Inactive Code Status Order ID  Comments User Context   10/18/2020 0221 10/30/2020 2104 Full Code [id number]  Eppie Gibson, MD Inpatient   09/28/2020 2319 10/05/2020 1653 Full Code [id number]  Elwyn Reach, MD ED   01/18/2020 1135 01/19/2020 2316 Full Code [id number]  Jonnie Finner, DO Inpatient   11/08/2019 1331 11/09/2019 1732 Full Code [id number]  [name], MD Inpatient      Advance Directive Documentation    Flowsheet Row Most Recent Value  Type of Advance Directive Healthcare Power of Attorney  Pre-existing out of facility DNR order (yellow form or pink MOST form) --  "MOST" Form in Place? --       Prognosis:  Guarded  Discharge Planning: To Be Determined  Care plan was discussed with patient, RN, Dr. Alfredia Ferguson, brother  Thank you for allowing the Palliative Medicine Team to assist in the care of this patient.   Total Time 50 Prolonged Time Billed No   Greater than 50%  of this time was spent counseling and coordinating care related to the above assessment and plan.   Micheline Rough, MD  Please contact Palliative Medicine Team phone at [phone] for questions and concerns.

## 2020-12-03 NOTE — Progress Notes (Signed)
PT Cancellation Note  Patient Details Name: CHINMAY SQUIER MRN: [id number] DOB: 1950/10/02   Cancelled Treatment:    Reason Eval/Treat Not Completed: Medical issues which prohibited therapy, will follow up tomorrow. Patient very weak.    Claretha Cooper 12/03/2020, 2:13 PM Tresa Endo PT Acute Rehabilitation Services Pager [phone] Office [phone]

## 2020-12-03 NOTE — Progress Notes (Signed)
  Speech Language Pathology Treatment: Dysphagia  Patient Details Name: Andre Glass MRN: [id number] DOB: May 06, 1950 Today's Date: 12/03/2020 Time: 1735-1800 SLP Time Calculation (min) (ACUTE ONLY): 25 min  Assessment / Plan / Recommendation Clinical Impression  Skilled SLP intervention included compensation strategy implementation, pt/family and nursing education re: dysphagia/aspiration precautions.  Pt is grossly weak and requires assist for optimal positioning in bed = can hold his own cup with both hands to improve neurological input for self feeding.  Reviewed prior MBS with pt and his family. Pt willing to consume intake including 4 ounces of cranberry juice, single sip of boost breeze and 1/2 tsp of jello.  Delayed swallow noted across consistencies but much more pronounced with jello where pt also demonstrated impaired oral clearance- without ability to fully expel jello into tissue provided by SLP.  SLP set up oral suction which resulted in adequate oral clearance.  Cough x2 observed - once delayed after liquid intake and 2nd time occured immediately post-swallow of Boost Breeze. Pt admits to sensing liquids "coming back up" - worse now with his abdomen distention - which may have been source of delayed cough. Anterior spillage of liquids right labial region noted which pt reports is new.  He does not demonstrate focal CN deficits impacting facial nerve - and is able to seal lips adequately - thus suspect is due to generalized weakness.  Using teach back, informed pt and his brother Andre Glass regarding recommendations for diet of clears *small amounts*, importance of self feeding given extent of delay in swallow and oral suctioning.   Will continue to follow for tolerance, compensation strategies.  Caregiver had reported pt was consuming puree/thin diet PTA.    HPI HPI: Patient is a 70 y.o. male  with PMH: metastatic prostate cancer with obstructive uropathy requiring nephrostomy tube placement,  recent CT with concern for malignant stricture and intra-abdominal metastatic disease, bilateral lower extremity DVT admitted on 11/27/2020 with abdominal pain with subsequent worsening and ex lap/ostomy performed urgently yesterday. Patient has been cleared by surgery for clear liquids.      SLP Plan  Continue with current plan of care       Recommendations  Diet recommendations: Thin liquid Liquids provided via: Cup;No straw Medication Administration: Other (Comment) (suspension)                Oral Care Recommendations: Oral care QID;Staff/trained caregiver to provide oral care Follow up Recommendations: Other (comment) SLP Visit Diagnosis: Dysphagia, unspecified (R13.10) Plan: Continue with current plan of care       GO              Kathleen Lime, MS [facility] SLP [place] Office [phone] Pager [phone]   Macario Golds 12/03/2020, 6:55 PM

## 2020-12-03 NOTE — Progress Notes (Signed)
OT Cancellation Note  Patient Details Name: Andre Glass MRN: [id number] DOB: October 29, 1950   Cancelled Treatment:    Reason Eval/Treat Not Completed: Medical issues which prohibited therapy patient is weak today per palliative note. Will check back tomorrow.   Jackelyn Poling OTR/L, MS Acute Rehabilitation Department Office# [phone] Pager# [phone]   [place] 12/03/2020, 2:19 PM

## 2020-12-03 NOTE — Progress Notes (Signed)
ANTICOAGULATION CONSULT NOTE   Pharmacy Consult for heparin Indication: recent bilateral DVT on 8/17 (home xarelto on hold)  Allergies  Allergen Reactions   Ibuprofen Anaphylaxis   Shrimp [Shellfish Allergy] Anaphylaxis    Patient Measurements: wt 83kg, height 68 inches Height: 5\' 8"  (172.7 cm) Weight: 90.4 kg (199 lb 4.7 oz) IBW/kg (Calculated) : 68.4 Heparin Dosing Weight: 83 kg  Vital Signs: Temp: 97.9 F (36.6 C) (08/30 2005) Temp Source: Oral (08/30 2005) BP: 117/71 (08/30 2005) Pulse Rate: 105 (08/30 2005)  Labs: Recent Labs    12/01/20 0356 12/02/20 0353 12/02/20 2000 12/03/20 0309 12/03/20 0842 12/03/20 1048 12/03/20 1950  HGB 8.5* 8.9*  --  8.6*  --   --   --   HCT 27.2* 28.3*  --  26.9*  --   --   --   PLT 181 181  --  258  --   --   --   HEPARINUNFRC 0.37 <0.10*   < > 0.20*  --  0.44 0.44  CREATININE 1.16 1.00  --   --  0.82  --   --    < > = values in this interval not displayed.     Estimated Creatinine Clearance: 91.5 mL/min (by C-G formula based on SCr of 0.82 mg/dL).   Medications:  - on xarelto dose pack PTA (on 15mg  bid - last dose taken on 11/26/20 at [facility])  Assessment: Patient is a 70 y.o M with metastatic prostate cancer currently undergoing chemotherapy treatment and on xarelto PTA for bilateral DVTs that was diagnosed on 11/20/20, presented to the ED on 11/27/20 with c/o abdominal pain. Surgery and GI consulted for fecal impaction. Pharmacy has been consulted to transition patient to heparin drip in case invasive intervention is needed with this admission. Baseline aPTT 34 sec, heparin level is elevated at 0.91--> is elevated d/t effect of xarelto  Patient taken to OR on 8/25 for sigmoid colectomy and end colostomy placement due to concern for large bowel perforation in setting of fecal impaction. Heparin was held and resumed 8/26 @ 0200. 8/28 am Heparin held for bleeding noted from nephrostomy tubes and colostomy  Today, 12/03/2020: 1100 Hep  level 0.44 & 1950 heparin level 0.4, mid therapeutic range Hemoglobin steadily downtrending (11.1 >9.8 > 8.9> 8.6), Platelets stable.  Per MD, "-He does have some blood coming from his nephrostomy tubes and his colostomy but urology has evaluated and will continue heparin drip given that he has a very large blood clot in his leg"  Goal of Therapy:  Heparin level 0.3-0.7 units/ml aPTT 66-102 seconds Monitor platelets by anticoagulation protocol: Yes   Plan:  Continue Heparin infusion at 2000 units/hr Daily heparin level & CBC Monitor for s/sx bleeding  F/u ability to transition back to [place], Pharm.D [phone] 12/03/2020 9:12 PM

## 2020-12-03 NOTE — Progress Notes (Signed)
PROGRESS NOTE    Andre Glass  CBS:[id number] DOB: 09/14/1950 DOA: 11/27/2020 PCP: [name], DO  Brief Narrative:  The patient is a 70 year old African-American male with a past medical history significant for but not limited to metastatic prostate cancer with resultant obstructive uropathy requiring bilateral nephrostomy tube placement with other comorbidities who presented with worsening right-sided abdominal pain that started on the morning of yesterday.  The pain was found to be in the right lower quadrant and was a sharp 10 out of 10 in severity.  Patient dates that he passed gas yesterday and had several loose bowel movements but states nothing made the pain worse or better.  He recently had a CT scan on 11/20/2020 which showed possible malignant stricture was progressive and intra-abdominal metastatic disease and extensive osseous metastatic disease as well as evidence of bilateral lower extremity DVT and pelvic veins being compressed by pelvic adenopathy.  He had right peroneal DVT and acute left femoral DVT extending through the veins of the left lower leg and vascular surgery was consulted at that time and recommended no intervention discharged on anticoagulation with Xarelto.  He is currently on amoxicillin and Trulance without relief.  He was given fleets enema without relief in the ED.  He was also noted to have a hemoglobin of 6.6 down from 7.5 a few days ago.  He was transfused 1 unit PRBCs and general surgery as well as gastroenterology were consulted and they recommended bowel regimen and no surgical intervention unless the patient did not improve however subsequently after his enema today the patien likely perforated secondary to history of colon ulcer and had rigidity and guarding consistent with peritonitis.  General surgery is taking him to the OR for emergent surgery for a laparotomy, partial colectomy and colostomy and he is postoperative day 1 for exploratory laparotomy, sigmoid  colectomy and end colostomy with Dr. Yancey Flemings on 11/28/2020.  Findings intraoperatively showed a large perforation with diffuse feculent peritonitis and extensive contamination of the peritoneal cavity with stool and pus  General surgery feels that he is at very high risk for abscess formation and they are recommending following his labs closely and consider repeating a scan in next few days.  They are recommending continuing NG tube to low intermittent suction and await viable bowel function.  He does have some colostomy output.  Wound care has been consulted and he has been getting wet-to-dry dressings twice daily and pain control with IV acetaminophen.  His electrolytes and labs are been off so we will stop his potassium supplementation and change his fluid to D5W and give him a dose of Lokelma  Today his blood count dropped a little bit and he started having some blood from his nephrostomy in his colostomy so we will hold his heparin drip.  Surgery recommending continuing NG tube to low intermittent suction.  Continued to hold heparin drip given that his hemoglobin continues to drop and now that he has some blood in his nephrostomy in his colostomy.  However his hemoglobin has improved and his nephrostomy tubes had minimal blood in him so heparin drip was resumed.  He does have some blood-tinged urine now on his nephrostomies. Medical oncology evaluated and feel that his overall prognosis from process cancer General guarded with limited life expectancy.  Oncology feels that if he is able to obtain meaningful recovery that they will resume chemotherapy however the odds of this is happening is very small.  He pulled out his NG tube again and  it was not replaced. General Surgery feels that he is still at risk for intra-abdominal abscesses given his intraoperative findings and they are waiting for improvement in his distention before giving liquids or food from a p.o. standpoint.   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 Windle Guard on 11/28/2020 laparotomies/ -Patient presents with a 3-day history of fecal impaction secondary to prostate cancer along with his home Dilaudid -He has tried senna and MiraLAX in the past with out relief -Patient takes Movantik and flecainide and he was given Colace and senna trial of tapwater enemas as well as lactulose enemas -GI was consulted and recommended enemas the way he takes them -After his enema today he became extremely hypotensive and had some guarding and rigidity of his abdomen and became diaphoretic -Stat KUB showed possible free air -General surgery was reconsulted and took the patient to the OR emergently -He is postoperative day 5 now and he is at very high risk for abscess formation and general surgery recommends continue to follow his labs and exam closely and consider repeating a CT scan in next few days -Wound care has been consulted -He is getting IV fluid hydration with normal saline 100 MLS per hour -Patient's NG tube has been removed and will hold off for now given that he continues to put out nightly -Patient's surgical pathology is back and it showed metastatic prostate cancer in the nodes -General surgery thinks his ostomy is tenuous but is working they are recommending continue IV antibiotics with IV Zosyn and JP drain for now; continue to monitor and may need a repeat CT scan soon -Palliative care has been consulted for goals of care discussion and appreciate their evaluation and he was not up to speaking  with Dr. Domingo Cocking yesterday and plans remain for aggressive care.  Patient still wants full scope of care.  Metastatic prostate cancer with subsequent pain -Continue with Tylenol, hydrocodone and Dilaudid -General surgery started him on IV acetaminophen -Medical oncology was consulted and they are recommending that if he has any meaningful recovery that they can resume chemotherapy but they feel this is small and they feel that his prognosis is guarded  Right Leg DVT -Heparin drip was held given his drop in hemoglobin and bleeding from Nephrostomies and Colostomy bleeding but now that the bleeding is improved we will resume heparin drip.  He continues to have some bleeding from his nephrostomies and this is very mild and will need to continue monitor carefully -Patient continues to complain of right leg pain and today is complaining about left hip pain  AKI Metabolic Acidosis -Patient's BUNs/creatinine went from 13/0.82 -> 17/1.16 -> 28/1.37 -> 28/1.16 -> 27/1.00 -> 22/0.82 -Patient continues to have a very mild metabolic acidosis with a CO2 of 19, Anion GAP of 9, chloride level of 108 -We will change his IV fluids further and add normal saline with 40 mill colons of KCl to his D5 given that he is now hypokalemia -Avoid nephrotoxic medications, contrast dyes, hypotension renally dose medications -Repeat CMP in the a.m.  Hypokalemia -Potassium dropped to 2.9 -Replete with IV KCl  40 mEq as well as IV K-Phos 10 mmol and D5 normal saline with 40 mEQ of KCl at 75 mL/hr -Continue to monitor and replete as necessary -Repeat CMP in a.m.  Hypophosphatemia -Mild with a Phos of 2.4 -Replete with IV K-Phos 10 mmol -Continue monitor and treat as necessary -Repeat phosphorus level in a.m.  Acute on chronic anemia likely of chronic disease, trending down further -He is status post 2 units of PRBCs and a drop to 6.6/21.5.  He was taken to the OR and his hemoglobin/hematocrit trended up to 11.1/35.2  and now further dropped and last 3 counts have been relatively stable so his heparin drip has been resumed -He does have some blood coming from his nephrostomy tubes and his colostomy but urology has evaluated and will continue heparin drip given that he has a very large blood clot in his leg -Continue monitor and trend repeat CBC this evening and in the AM   Hematuria -In the setting of being on anticoagulation -Continue to monitor for gross hematuria and may need urology consultation and will hold his anticoagulation now that he is having blood in his nephrostomy -Have asked Urology to see the patient now and appreciate Dr. Gerilyn Pilgrim evaluation.  The hematuria is slightly improving off of blood thinners they recommend continuing bilateral nephroureteral stents to drainage and considering Trial once patient more stable closer to discharge; his anticoagulation has been resumed  Hypernatremia -Patient sodium went from 143 and is now 47 -Started D5W at 75 mils per hour and will continue -Continue monitor and trend and repeat CMP in  Leukocytosis -In the setting of fecal impaction and perforation now -WBC trended up from 24.4 is now 28.1 yesterday but then trended down to 5.9 is now trending back up to 11.3 in setting of his surgery and is now 20.7 and trended back down to 18.4 and is now trended back up to 21.0 -> 21.8 -Currently getting cefazolin for surgical prophylaxis and was also on p.o. Keflex 500 mg 3 times daily; also received a dose of IV Zosyn  DVT prophylaxis: Anticoagulated with a heparin drip but this will be held given bleeding  Code Status: Full code Family Communication: Spoke with his brother Legrand Como at bedside Disposition Plan: Pending further clinical improvement and clearance by specialists  Status is: Inpatient  Remains inpatient appropriate because:Unsafe d/c plan, IV treatments appropriate due to intensity of illness or inability to take PO, and Inpatient level of care  appropriate due to severity of illness  Dispo: The patient is from: Home              Anticipated d/c is to:  TBD              Patient currently is not medically stable to d/c.   Difficult to place patient No  Consultants:  General surgery Gastroenterology Palliative care medicine Urology Medical Oncology  Procedures: Emergent surgery: Procedure performed: Exploratory laparotomy, sigmoid colectomy with end colostomy  Antimicrobials: Anti-infectives (From admission, onward)    Start     Dose/Rate Route Frequency Ordered Stop   11/28/20 2230  piperacillin-tazobactam (ZOSYN) IVPB 3.375 g        3.375 g 12.5 mL/hr over 240 Minutes Intravenous Every 8 hours 11/28/20 2121     11/28/20 2215  piperacillin-tazobactam (ZOSYN) IVPB 3.375 g  Status:  Discontinued       Note to Pharmacy: Given Intraoperative   3.375 g 100 mL/hr over 30 Minutes Intravenous Every 8 hours 11/28/20 2117 11/28/20  2121   11/28/20 1730  piperacillin-tazobactam (ZOSYN) IVPB 3.375 g       Note to Pharmacy: Given Intraoperative   3.375 g 100 mL/hr over 30 Minutes Intravenous  Once 11/28/20 1729 11/28/20 1750   11/28/20 1712  piperacillin-tazobactam (ZOSYN) 3.375 GM/50ML IVPB       Note to Pharmacy: Marchia Meiers   : cabinet override      11/28/20 1712 11/28/20 1735   11/28/20 1545  ceFAZolin (ANCEF) IVPB 2g/100 mL premix        2 g 200 mL/hr over 30 Minutes Intravenous  Once 11/28/20 1538 11/28/20 1630   11/28/20 1539  ceFAZolin (ANCEF) 2-4 GM/100ML-% IVPB       Note to Pharmacy: Minor, Anneita   : cabinet override      11/28/20 1539 11/28/20 1654   11/27/20 1700  cephALEXin (KEFLEX) capsule 500 mg  Status:  Discontinued        500 mg Oral 3 times daily 11/27/20 1640 11/28/20 2117        Subjective: Seen and examined at bedside he states he is not feeling well today and was complaining of significant pain.  Still has raspy voice.  Not able to tolerate anteriorly p.o.  Even though he is tolerating sips.   General surgery recommends advance diet slowly as tolerated but he is not really tolerating some and I will try and add some p.o. pain control modalities today but per nursing he is not taking anything by mouth.  Still complains of pain.  No other concerns or complaints at this time.  Objective: Vitals:   12/02/20 2028 12/03/20 0505 12/03/20 0518 12/03/20 1411  BP: 136/72  129/71 118/62  Pulse: 98  92 98  Resp: 20  20 16   Temp: (!) 97.4 F (36.3 C)  97.7 F (36.5 C) 97.7 F (36.5 C)  TempSrc: Oral   Oral  SpO2: 98%  96% 96%  Weight:  90.4 kg    Height:        Intake/Output Summary (Last 24 hours) at 12/03/2020 1536 Last data filed at 12/03/2020 1400 Gross per 24 hour  Intake 1702.3 ml  Output 1605 ml  Net 97.3 ml    Filed Weights   12/01/20 0602 12/02/20 0256 12/03/20 0505  Weight: 91.5 kg 92.7 kg 90.4 kg   Examination: Physical Exam:  Constitutional: WN/WD, chronically ill-appearing African-American male who appears uncomfortable and complaining of some left-sided pain Eyes: Lids and conjunctivae normal, sclerae anicteric  ENMT: External Ears, Nose appear normal. Grossly normal hearing.  Has a raspy voice Neck: Appears normal, supple, no cervical masses, normal ROM, no appreciable thyromegaly; no appreciable JVD Respiratory: Diminished to auscultation bilaterally, no wheezing, rales, rhonchi or crackles. Normal respiratory effort and patient is not tachypenic. No accessory muscle use.  Unlabored breathing Cardiovascular: RRR, no murmurs / rubs / gallops. S1 and S2 auscultated.  Has 1+ lower extremity edema Abdomen: Soft, tender to palpate, distended secondary body habitus and he has a colostomy in place with a JP drain on the right. No masses palpated.  Bowel sounds positive.  GU: Nephrostomy is noted with some bloody drainage bilaterally with coarse bloody drainage on the left compared to right Musculoskeletal: No clubbing / cyanosis of digits/nails. No joint deformity upper  and lower extremities.  Skin: No rashes, lesions, ulcers on limited skin evaluation. No induration; Warm and dry.  Neurologic: CN 2-12 grossly intact with no focal deficits. Romberg sign and cerebellar reflexes not assessed.  Psychiatric: Normal judgment and  insight.  He is awake and alert.  Appears very anxious and depressed  Data Reviewed: I have personally reviewed following labs and imaging studies  CBC: Recent Labs  Lab 11/27/20 1035 11/28/20 0045 11/29/20 0352 11/30/20 0346 12/01/20 0356 12/02/20 0353 12/03/20 0309  WBC 24.4*   < > 11.3* 20.7* 18.4* 21.0* 21.8*  NEUTROABS 21.7*  --  10.3*  --   --   --   --   HGB 6.6*   < > 11.1* 9.8* 8.5* 8.9* 8.6*  HCT 21.9*   < > 35.2* 31.5* 27.2* 28.3* 26.9*  MCV 96.1   < > 93.4 93.2 93.8 93.4 92.4  PLT 296   < > 213 193 181 181 258   < > = values in this interval not displayed.    Basic Metabolic Panel: Recent Labs  Lab 11/29/20 0352 11/30/20 0346 11/30/20 1804 12/01/20 0356 12/02/20 0353 12/03/20 0842  NA 143 147* 141 141 140 136  K 4.5 5.8* 4.7 4.2 3.5 2.9*  CL 114* 120* 115* 116* 113* 108  CO2 21* 19* 19* 19* 23 19*  GLUCOSE 166* 167* 100* 82 73 85  BUN 17 28* 29* 28* 27* 22  CREATININE 1.16 1.37* 1.31* 1.16 1.00 0.82  CALCIUM 8.0* 8.0* 7.8* 7.9* 7.9* 7.4*  MG 2.1 2.2  --  2.1 2.1 2.0  PHOS 4.1 3.9  --  3.0 2.9 2.4*    GFR: Estimated Creatinine Clearance: 91.5 mL/min (by C-G formula based on SCr of 0.82 mg/dL). Liver Function Tests: Recent Labs  Lab 11/30/20 0346 11/30/20 1804 12/01/20 0356 12/02/20 0353 12/03/20 0842  AST 26 26 28  41 36  ALT 11 10 9 15 18   ALKPHOS 69 85 87 116 119  BILITOT 0.4 0.5 0.5 0.9 1.0  PROT 5.3* 5.1* 4.9* 5.1* 4.8*  ALBUMIN 2.0* 1.9* 1.7* 1.7* 1.5*    Recent Labs  Lab 11/27/20 1035  LIPASE 22    No results for input(s): AMMONIA in the last 168 hours. Coagulation Profile: No results for input(s): INR, PROTIME in the last 168 hours. Cardiac Enzymes: No results for  input(s): CKTOTAL, CKMB, CKMBINDEX, TROPONINI in the last 168 hours. BNP (last 3 results) No results for input(s): PROBNP in the last 8760 hours. HbA1C: No results for input(s): HGBA1C in the last 72 hours. CBG: No results for input(s): GLUCAP in the last 168 hours. Lipid Profile: No results for input(s): CHOL, HDL, LDLCALC, TRIG, CHOLHDL, LDLDIRECT in the last 72 hours. Thyroid Function Tests: No results for input(s): TSH, T4TOTAL, FREET4, T3FREE, THYROIDAB in the last 72 hours. Anemia Panel: No results for input(s): VITAMINB12, FOLATE, FERRITIN, TIBC, IRON, RETICCTPCT in the last 72 hours. Sepsis Labs: Recent Labs  Lab 11/27/20 1035  LATICACIDVEN 0.8     Recent Results (from the past 240 hour(s))  Resp Panel by RT-PCR (Flu A&B, Covid) Nasopharyngeal Swab     Status: None   Collection Time: 11/27/20  1:51 PM   Specimen: Nasopharyngeal Swab; Nasopharyngeal(NP) swabs in vial transport medium  Result Value Ref Range Status   SARS Coronavirus 2 by RT PCR NEGATIVE NEGATIVE Final    Comment: (NOTE) SARS-CoV-2 target nucleic acids are NOT DETECTED.  The SARS-CoV-2 RNA is generally detectable in upper respiratory specimens during the acute phase of infection. The lowest concentration of SARS-CoV-2 viral copies this assay can detect is 138 copies/mL. A negative result does not preclude SARS-Cov-2 infection and should not be used as the sole basis for treatment or other patient management decisions.  A negative result may occur with  improper specimen collection/handling, submission of specimen other than nasopharyngeal swab, presence of viral mutation(s) within the areas targeted by this assay, and inadequate number of viral copies(<138 copies/mL). A negative result must be combined with clinical observations, patient history, and epidemiological information. The expected result is Negative.  Fact Sheet for Patients:  EntrepreneurPulse.com.au  Fact Sheet for  Healthcare Providers:  IncredibleEmployment.be  This test is no t yet approved or cleared by the [place] FDA and  has been authorized for detection and/or diagnosis of SARS-CoV-2 by FDA under an Emergency Use Authorization (EUA). This EUA will remain  in effect (meaning this test can be used) for the duration of the COVID-19 declaration under Section 564(b)(1) of the Act, 21 U.S.C.section 360bbb-3(b)(1), unless the authorization is terminated  or revoked sooner.       Influenza A by PCR NEGATIVE NEGATIVE Final   Influenza B by PCR NEGATIVE NEGATIVE Final    Comment: (NOTE) The Xpert Xpress SARS-CoV-2/FLU/RSV plus assay is intended as an aid in the diagnosis of influenza from Nasopharyngeal swab specimens and should not be used as a sole basis for treatment. Nasal washings and aspirates are unacceptable for Xpert Xpress SARS-CoV-2/FLU/RSV testing.  Fact Sheet for Patients: EntrepreneurPulse.com.au  Fact Sheet for Healthcare Providers: IncredibleEmployment.be  This test is not yet approved or cleared by the [place] FDA and has been authorized for detection and/or diagnosis of SARS-CoV-2 by FDA under an Emergency Use Authorization (EUA). This EUA will remain in effect (meaning this test can be used) for the duration of the COVID-19 declaration under Section 564(b)(1) of the Act, 21 U.S.C. section 360bbb-3(b)(1), unless the authorization is terminated or revoked.  Performed at [facility], [place] [address], [place], [place] [address]   Urine Culture     Status: None   Collection Time: 11/27/20  3:25 PM   Specimen: Kidney; Urine  Result Value Ref Range Status   Specimen Description   Final    KIDNEY LEFT NEPHROSTOMY Performed at [place] [address] [place], [place] [address]    Special Requests   Final    NONE Performed at [facility], [place]  [address], [place], [place] [address]    Culture   Final    NO GROWTH Performed at [facility], [place] [address] [place], [place] [address]    Report Status 11/29/2020 FINAL  Final  Urine Culture     Status: None   Collection Time: 11/27/20  3:25 PM   Specimen: Kidney; Urine  Result Value Ref Range Status   Specimen Description   Final    KIDNEY RIGHT NEPHROSTOMY Performed at [place] [address], [place], [place] [address]    Special Requests   Final    NONE Performed at [facility], [place] [address], [place], [place] [address]    Culture   Final    NO GROWTH Performed at [facility], [place] [address], [place], [place] [address]    Report Status 11/29/2020 FINAL  Final      RN Pressure Injury Documentation:     Estimated body mass index is 30.3 kg/m as calculated from the following:   Height as of this encounter: 5\' 8"  (1.727 m).   Weight as of this encounter: 90.4 kg.  Malnutrition Type:  Nutrition Problem: Increased nutrient needs Etiology: cancer and cancer related treatments  Malnutrition Characteristics:  Signs/Symptoms: estimated  needs  Nutrition Interventions:  Interventions: Refer to RD note for recommendations   Radiology Studies: No results found.  Scheduled Meds:  acetaminophen (TYLENOL) oral liquid 160 mg/5 mL  650 mg Oral Q6H   vitamin C  1,000 mg Oral Daily   chlorhexidine  15 mL Mouth Rinse BID   Chlorhexidine Gluconate Cloth  6 each Topical Daily   docusate sodium  100 mg Oral BID   mouth rinse  15 mL Mouth Rinse q12n4p   mirtazapine  7.5 mg Oral QHS   multivitamin with minerals  1 tablet Oral Daily   naloxegol oxalate  12.5 mg Oral Daily   Plecanatide  3 mg Oral Daily   predniSONE  5 mg Oral Q breakfast   pregabalin  50 mg Oral BID   Continuous Infusions:  dextrose 75 mL/hr at 12/03/20 0309   heparin 2,000 Units/hr (12/03/20 0421)   methocarbamol (ROBAXIN) IV 500 mg (12/03/20  0521)   piperacillin-tazobactam (ZOSYN)  IV 3.375 g (12/03/20 1340)    LOS: 6 days   Kerney Elbe, DO [facility] PAGER is on AMION  If 7PM-7AM, please contact night-coverage [email or web address]

## 2020-12-03 NOTE — Progress Notes (Signed)
Progress Note  5 Days Post-Op  Subjective: Patient reports abdominal pain. Tolerated some sips of clears yesterday. Colostomy functioning   Objective: Vital signs in last 24 hours: Temp:  [97.4 F (36.3 C)-98.4 F (36.9 C)] 97.7 F (36.5 C) (08/30 0518) Pulse Rate:  [92-100] 92 (08/30 0518) Resp:  [18-20] 20 (08/30 0518) BP: (129-136)/(71-74) 129/71 (08/30 0518) SpO2:  [96 %-98 %] 96 % (08/30 0518) Weight:  [90.4 kg] 90.4 kg (08/30 0505) Last BM Date: 12/02/20  Intake/Output from previous day: 08/29 0701 - 08/30 0700 In: 1852.3 [I.V.:1142.7; IV Piggyback:709.6] Out: 1400 [NFAOZ:3086; Drains:5; Stool:50] Intake/Output this shift: No intake/output data recorded.  PE: General: pleasant, WD, chronically ill appearing male who is laying in bed in NAD Heart: regular, rate, and rhythm.   Lungs: CTAB, no wheezes, rhonchi, or rales noted.  Respiratory effort nonlabored Abd: soft, appropriately ttp, distended, midline dressing c/d/I, drain in RLQ with SS fluid, stoma necrotic but with some bleeding centrally and gas/stool in pouch   Lab Results:  Recent Labs    12/02/20 0353 12/03/20 0309  WBC 21.0* 21.8*  HGB 8.9* 8.6*  HCT 28.3* 26.9*  PLT 181 258   BMET Recent Labs    12/02/20 0353 12/03/20 0842  NA 140 136  K 3.5 2.9*  CL 113* 108  CO2 23 19*  GLUCOSE 73 85  BUN 27* 22  CREATININE 1.00 0.82  CALCIUM 7.9* 7.4*   PT/INR No results for input(s): LABPROT, INR in the last 72 hours. CMP     Component Value Date/Time   NA 136 12/03/2020 0842   NA 143 11/25/2020 1554   K 2.9 (L) 12/03/2020 0842   CL 108 12/03/2020 0842   CO2 19 (L) 12/03/2020 0842   GLUCOSE 85 12/03/2020 0842   BUN 22 12/03/2020 0842   BUN 23 11/25/2020 1554   CREATININE 0.82 12/03/2020 0842   CREATININE 1.00 11/14/2020 1146   CALCIUM 7.4 (L) 12/03/2020 0842   PROT 4.8 (L) 12/03/2020 0842   ALBUMIN 1.5 (L) 12/03/2020 0842   AST 36 12/03/2020 0842   AST 35 11/14/2020 1146   ALT 18  12/03/2020 0842   ALT 45 (H) 11/14/2020 1146   ALKPHOS 119 12/03/2020 0842   BILITOT 1.0 12/03/2020 0842   BILITOT 0.3 11/14/2020 1146   GFRNONAA >60 12/03/2020 0842   GFRNONAA >60 11/14/2020 1146   GFRAA >60 11/29/2019 1306   Lipase     Component Value Date/Time   LIPASE 22 11/27/2020 1035       Studies/Results: No results found.  Anti-infectives: Anti-infectives (From admission, onward)    Start     Dose/Rate Route Frequency Ordered Stop   11/28/20 2230  piperacillin-tazobactam (ZOSYN) IVPB 3.375 g        3.375 g 12.5 mL/hr over 240 Minutes Intravenous Every 8 hours 11/28/20 2121     11/28/20 2215  piperacillin-tazobactam (ZOSYN) IVPB 3.375 g  Status:  Discontinued       Note to Pharmacy: Given Intraoperative   3.375 g 100 mL/hr over 30 Minutes Intravenous Every 8 hours 11/28/20 2117 11/28/20 2121   11/28/20 1730  piperacillin-tazobactam (ZOSYN) IVPB 3.375 g       Note to Pharmacy: Given Intraoperative   3.375 g 100 mL/hr over 30 Minutes Intravenous  Once 11/28/20 1729 11/28/20 1750   11/28/20 1712  piperacillin-tazobactam (ZOSYN) 3.375 GM/50ML IVPB       Note to Pharmacy: Marchia Meiers   : cabinet override      11/28/20  1712 11/28/20 1735   11/28/20 1545  ceFAZolin (ANCEF) IVPB 2g/100 mL premix        2 g 200 mL/hr over 30 Minutes Intravenous  Once 11/28/20 1538 11/28/20 1630   11/28/20 1539  ceFAZolin (ANCEF) 2-4 GM/100ML-% IVPB       Note to Pharmacy: Minor, Anneita   : cabinet override      11/28/20 1539 11/28/20 1654   11/27/20 1700  cephALEXin (KEFLEX) capsule 500 mg  Status:  Discontinued        500 mg Oral 3 times daily 11/27/20 1640 11/28/20 2117        Assessment/Plan Fecal impaction/Severe acute on chronic constipation POD5 s/p ex lap, sigmoid colectomy, end colostomy Dr. Kae Heller 8/25 Findings of large perforation with diffuse feculent peritonitis and extensive contamination of the peritoneal cavity with stool and pus - He is high risk for abscess  formation/  We will follow his labs and exam closely and consider repeat CT scan in the next few days - tolerating sips, ok to advance slowly as tolerated  - try to add some PO pain control modalities today  - WOC consulted, stoma is necrotic but appears to be viable above the fascia  - Twice daily wet-to-dry dressing changes to midline wound - incentive spirometry, PT/OT - mobilize as able     FEN: Ok to advance slowly as tolerated and per SLP, IVF at 75 ml/hr per TR[facility] ID: keflex. Zosyn 8/25>> VTE: heparin gtt   Metastatic prostate cancer ABL anemia on anemia of chronic disease - hgb 8.9, stable DVT, b/l LE   LOS: 6 days    Norm Parcel, [facility] 12/03/2020, 11:29 AM Please see Amion for pager number during day hours 7:00am-4:30pm

## 2020-12-04 ENCOUNTER — Encounter (HOSPITAL_COMMUNITY): Payer: Self-pay | Admitting: Family Medicine

## 2020-12-04 ENCOUNTER — Inpatient Hospital Stay (HOSPITAL_COMMUNITY): Payer: Medicare Other

## 2020-12-04 LAB — COMPREHENSIVE METABOLIC PANEL
ALT: 14 U/L (ref 0–44)
AST: 33 U/L (ref 15–41)
Albumin: 1.4 g/dL — ABNORMAL LOW (ref 3.5–5.0)
Alkaline Phosphatase: 119 U/L (ref 38–126)
Anion gap: 8 (ref 5–15)
BUN: 14 mg/dL (ref 8–23)
CO2: 19 mmol/L — ABNORMAL LOW (ref 22–32)
Calcium: 7.6 mg/dL — ABNORMAL LOW (ref 8.9–10.3)
Chloride: 115 mmol/L — ABNORMAL HIGH (ref 98–111)
Creatinine, Ser: 0.81 mg/dL (ref 0.61–1.24)
GFR, Estimated: >60 mL/min (ref >60)
Glucose, Bld: 116 mg/dL — ABNORMAL HIGH (ref 70–99)
Potassium: 3.8 mmol/L (ref 3.5–5.1)
Sodium: 142 mmol/L (ref 135–145)
Total Bilirubin: 0.9 mg/dL (ref 0.3–1.2)
Total Protein: 5.1 g/dL — ABNORMAL LOW (ref 6.5–8.1)

## 2020-12-04 LAB — CBC WITH DIFFERENTIAL/PLATELET
Abs Immature Granulocytes: 2.06 10*3/uL — ABNORMAL HIGH (ref 0.00–0.07)
Basophils Absolute: 0.1 10*3/uL (ref 0.0–0.1)
Basophils Relative: 0 %
Eosinophils Absolute: 0.1 10*3/uL (ref 0.0–0.5)
Eosinophils Relative: 0 %
HCT: 25 % — ABNORMAL LOW (ref 39.0–52.0)
Hemoglobin: 7.8 g/dL — ABNORMAL LOW (ref 13.0–17.0)
Immature Granulocytes: 8 %
Lymphocytes Relative: 7 %
Lymphs Abs: 1.9 10*3/uL (ref 0.7–4.0)
MCH: 29.1 pg (ref 26.0–34.0)
MCHC: 31.2 g/dL (ref 30.0–36.0)
MCV: 93.3 fL (ref 80.0–100.0)
Monocytes Absolute: 0.8 10*3/uL (ref 0.1–1.0)
Monocytes Relative: 3 %
Neutro Abs: 21.7 10*3/uL — ABNORMAL HIGH (ref 1.7–7.7)
Neutrophils Relative %: 82 %
Platelets: 272 10*3/uL (ref 150–400)
RBC: 2.68 MIL/uL — ABNORMAL LOW (ref 4.22–5.81)
RDW: 18.6 % — ABNORMAL HIGH (ref 11.5–15.5)
WBC: 26.6 10*3/uL — ABNORMAL HIGH (ref 4.0–10.5)
nRBC: 0.2 % (ref 0.0–0.2)

## 2020-12-04 LAB — MAGNESIUM: Magnesium: 2 mg/dL (ref 1.7–2.4)

## 2020-12-04 LAB — HEPARIN LEVEL (UNFRACTIONATED): Heparin Unfractionated: 0.6 IU/mL (ref 0.30–0.70)

## 2020-12-04 LAB — PHOSPHORUS: Phosphorus: 2.3 mg/dL — ABNORMAL LOW (ref 2.5–4.6)

## 2020-12-04 MED ORDER — IOHEXOL 350 MG/ML SOLN
75.0000 mL | Freq: Once | INTRAVENOUS | Status: AC | PRN
  Administered 2020-12-04: 75 mL via INTRAVENOUS

## 2020-12-04 MED ORDER — IOHEXOL 9 MG/ML PO SOLN: 500.0000 mL | ORAL | Status: AC

## 2020-12-04 MED ORDER — IOHEXOL 9 MG/ML PO SOLN
ORAL | Status: AC
  Administered 2020-12-04: 500 mL
  Filled 2020-12-04: qty 1000

## 2020-12-04 NOTE — Progress Notes (Signed)
Progress Note  6 Days Post-Op  Subjective: Patient reports stable to improved pain - now having lots of output from ostomy. Tolerated some sips of clears yesterday. Colostomy functioning well  Objective: Vital signs in last 24 hours: Temp:  [97.7 F (36.5 C)-98 F (36.7 C)] 98 F (36.7 C) (08/31 0443) Pulse Rate:  [98-105] 99 (08/31 0443) Resp:  [16-18] 16 (08/31 0443) BP: (117-134)/(62-72) 134/72 (08/31 0443) SpO2:  [96 %-100 %] 100 % (08/31 0443) Weight:  [93 kg] 93 kg (08/31 0500) Last BM Date: 12/02/20  Intake/Output from previous day: 08/30 0701 - 08/31 0700 In: 2100.9 [P.O.:120; I.V.:1412.6; IV Piggyback:568.4] Out: 1280 [Urine:1275; Drains:5] Intake/Output this shift: No intake/output data recorded.  PE: General: pleasant, WD, chronically ill appearing male who is laying in bed in NAD Heart: regular, rate, and rhythm.   Lungs: CTAB, no wheezes, rhonchi, or rales noted.  Respiratory effort nonlabored Abd: soft, appropriately ttp, mildly distended, midline dressing c/d/I, drain in RLQ with SS fluid, stoma necrotic but with some bleeding centrally and gas/stool in pouch   Lab Results:  Recent Labs    12/03/20 0309 12/04/20 0327  WBC 21.8* 26.6*  HGB 8.6* 7.8*  HCT 26.9* 25.0*  PLT 258 272   BMET Recent Labs    12/03/20 0842 12/04/20 0327  NA 136 142  K 2.9* 3.8  CL 108 115*  CO2 19* 19*  GLUCOSE 85 116*  BUN 22 14  CREATININE 0.82 0.81  CALCIUM 7.4* 7.6*   PT/INR No results for input(s): LABPROT, INR in the last 72 hours. CMP     Component Value Date/Time   NA 142 12/04/2020 0327   NA 143 11/25/2020 1554   K 3.8 12/04/2020 0327   CL 115 (H) 12/04/2020 0327   CO2 19 (L) 12/04/2020 0327   GLUCOSE 116 (H) 12/04/2020 0327   BUN 14 12/04/2020 0327   BUN 23 11/25/2020 1554   CREATININE 0.81 12/04/2020 0327   CREATININE 1.00 11/14/2020 1146   CALCIUM 7.6 (L) 12/04/2020 0327   PROT 5.1 (L) 12/04/2020 0327   ALBUMIN 1.4 (L) 12/04/2020 0327    AST 33 12/04/2020 0327   AST 35 11/14/2020 1146   ALT 14 12/04/2020 0327   ALT 45 (H) 11/14/2020 1146   ALKPHOS 119 12/04/2020 0327   BILITOT 0.9 12/04/2020 0327   BILITOT 0.3 11/14/2020 1146   GFRNONAA >60 12/04/2020 0327   GFRNONAA >60 11/14/2020 1146   GFRAA >60 11/29/2019 1306   Lipase     Component Value Date/Time   LIPASE 22 11/27/2020 1035       Studies/Results: No results found.  Anti-infectives: Anti-infectives (From admission, onward)    Start     Dose/Rate Route Frequency Ordered Stop   11/28/20 2230  piperacillin-tazobactam (ZOSYN) IVPB 3.375 g        3.375 g 12.5 mL/hr over 240 Minutes Intravenous Every 8 hours 11/28/20 2121     11/28/20 2215  piperacillin-tazobactam (ZOSYN) IVPB 3.375 g  Status:  Discontinued       Note to Pharmacy: Given Intraoperative   3.375 g 100 mL/hr over 30 Minutes Intravenous Every 8 hours 11/28/20 2117 11/28/20 2121   11/28/20 1730  piperacillin-tazobactam (ZOSYN) IVPB 3.375 g       Note to Pharmacy: Given Intraoperative   3.375 g 100 mL/hr over 30 Minutes Intravenous  Once 11/28/20 1729 11/28/20 1750   11/28/20 1712  piperacillin-tazobactam (ZOSYN) 3.375 GM/50ML IVPB       Note to Pharmacy: Maida Sale,  Pamela   : cabinet override      11/28/20 1712 11/28/20 1735   11/28/20 1545  ceFAZolin (ANCEF) IVPB 2g/100 mL premix        2 g 200 mL/hr over 30 Minutes Intravenous  Once 11/28/20 1538 11/28/20 1630   11/28/20 1539  ceFAZolin (ANCEF) 2-4 GM/100ML-% IVPB       Note to Pharmacy: Minor, Anneita   : cabinet override      11/28/20 1539 11/28/20 1654   11/27/20 1700  cephALEXin (KEFLEX) capsule 500 mg  Status:  Discontinued        500 mg Oral 3 times daily 11/27/20 1640 11/28/20 2117        Assessment/Plan Fecal impaction/Severe acute on chronic constipation POD6 s/p ex lap, sigmoid colectomy, end colostomy Dr. Kae Heller 8/25 Findings of large perforation with diffuse feculent peritonitis and extensive contamination of the  peritoneal cavity with stool and pus - He is high risk for abscess formation - will obtain CT A/P today given WBC up and now him being 6 days out - tolerating sips, ok to advance slowly as tolerated  - try to add some PO pain control modalities today  - WOC consulted, stoma is necrotic but appears to be viable above the fascia  - Twice daily wet-to-dry dressing changes to midline wound - incentive spirometry, PT/OT - mobilize as able  FEN: Ok to advance slowly as tolerated and per SLP, IVF at 75 ml/hr per TRH ID: keflex. Zosyn 8/25>> VTE: heparin gtt   Metastatic prostate cancer ABL anemia on anemia of chronic disease - hgb 8.9, stable DVT, b/l LE   LOS: 7 days    Ileana Roup, [facility] 12/04/2020, 1:18 PM Please see Amion for pager number during day hours 7:00am-4:30pm

## 2020-12-04 NOTE — Consult Note (Signed)
[place] Nurse ostomy follow up Patient receiving care in [place]. Stoma type/location: LUQ colostomy Stomal assessment/size: black, no change in appearance or size since Monday Peristomal assessment: deferred; pouch changed at 0200 on night shift last night. Treatment options for stomal/peristomal skin:  Output: one clump of something in pouch Ostomy pouching: 2pc.  Education provided: none Enrolled patient in [employer] Discharge program: No--discharge planning continues. In my view the patient needs SNF placement due to his tremendous weakness, ongoing monitoring of multiple medical conditions requirements, multiple skilled needs, and that he is a total care patient.  Will reassess later this week. Val Riles, RN, MSN, CWOCN, CNS-BC, pager [phone]

## 2020-12-04 NOTE — Evaluation (Signed)
Physical Therapy Evaluation Patient Details Name: Andre Glass MRN: [id number] DOB: 02-16-1951 Today's Date: 12/04/2020   History of Present Illness  The pt is a 70 yo male presenting on 8/24 and admitted with possible malignant stricture was progressive and intra-abdominal metastatic disease, extensive osseous metastatic disease, evidence of bilateral lower extremity DVT and pelvic adenopathy. patient was also found to have fecal impaction with GI bleed impacting DVT treament.patient underwent ex lap, sigmoid colectomy,and end colostomy on 8/25.  PMH: reccently admitted for obstructive uropathy with bilateral hydronephrosis, s/p placement of percutaneous nephrostomy tubes bilaterally,UTI metastatic adenocarinoma of prostate, chornic pain, HTN, and CKD.  Clinical Impression  Patient resting in bed, family at bedside. Patient's voice is very quiet, almost inaudible. Patient does answer and follow simple directions as able. The patient is very weak, reports right foot/ankle pain. Tolerates very little A/AROM there. Did tolerate AAROM on Left, attempted active muscle movements on both legs but profoundly weak.  Gradually adjusted bed  so patient is sitting in upright/ bed chair position, attempted leaning forward, reporting back pain. Patient   did begin to hold container of contrast and took sips several times. Patient appeared more alert in upright position. Left  patient in semi seated position.  Unsure of Dc plan at this time barring further treatments for acute medical conditions. Pt admitted with above diagnosis.  Pt currently with functional limitations due to the deficits listed below (see PT Problem List). Pt will benefit from skilled PT to increase their independence and safety with mobility to allow discharge to the venue listed below.        Follow Up Recommendations SNF    Equipment Recommendations  None recommended by PT    Recommendations for Other Services       Precautions /  Restrictions Precautions Precautions: Fall Precaution Comments: monitor O2,central line, soft spoken, JP drains bilateral, colostomy Restrictions Weight Bearing Restrictions: No      Mobility  Bed Mobility Overal bed mobility: Needs Assistance Bed Mobility: Rolling Rolling: +2 for physical assistance;Max assist         General bed mobility comments: Bed gradually placed in upright partial bed/chair position and tilted, able to self support head and drink from contrast bottle. sat upright, back supported for ~10 minutes.    Transfers                 General transfer comment: NT, will need lift  Ambulation/Gait                Stairs            Wheelchair Mobility    Modified Rankin (Stroke Patients Only)       Balance Overall balance assessment: Needs assistance Sitting-balance support: Bilateral upper extremity supported   Sitting balance - Comments: did not support self when bed upright position and assisted to lean forward, each hand on bed rail to attempt pulling forward.                                     Pertinent Vitals/Pain Pain Assessment: Faces Faces Pain Scale: Hurts even more Pain Location: BLE,esp. right foot Pain Descriptors / Indicators: Discomfort;Grimacing Pain Intervention(s): Monitored during session;Limited activity within patient's tolerance    Home Living Family/patient expects to be discharged to:: Unsure Living Arrangements: Other relatives Available Help at Discharge: Family;Personal care attendant Type of Home: House  Home Layout: One level Home Equipment: Streetman - 4 wheels;Walker - 2 wheels;Cane - quad;Shower seat;Grab bars - tub/shower;Bedside commode;Wheelchair - manual;Hospital bed Additional Comments: caregiver 2 hrs per day 5x/wk    Prior Function Level of Independence: Needs assistance   Gait / Transfers Assistance Needed: reports ambulation with rollator within the house and for  community distances, [facility] for doctors apt ( per chart review)  ADL's / Homemaking Assistance Needed: per chart review, patient has care attendant that assists with meals and showering  Comments: amb with cane or RW     Hand Dominance   Dominant Hand: Right    Extremity/Trunk Assessment   Upper Extremity Assessment Upper Extremity Assessment: Generalized weakness    Lower Extremity Assessment Lower Extremity Assessment: RLE deficits/detail;LLE deficits/detail RLE Deficits / Details: minimal effort to move toes, to dorsi flex ankle, able to int rotate partial range at hip. did have 2/5 knee ext with knee flexed at 30* RLE: Unable to fully assess due to pain LLE Deficits / Details: min toe and ankle activity, able to rotate int/ext  at hip, tolertaed hip and knee flex to ~ 50* LLE: Unable to fully assess due to pain    Cervical / Trunk Assessment Cervical / Trunk Assessment: Other exceptions Cervical / Trunk Exceptions: unable to sit unsupported in bed  Communication   Communication: Other (comment) (voice is very quiet, barely able to hear him speak.)  Cognition Arousal/Alertness: Awake/alert Behavior During Therapy: WFL for tasks assessed/performed Overall Cognitive Status: Difficult to assess                                 General Comments: patients family was present in room. patient is very soft spoken. did not ask orientation questions due to patient difficulty speaking. Was attentive and followed simple directions      General Comments      Exercises     Assessment/Plan    PT Assessment Patient needs continued PT services  PT Problem List Decreased strength;Decreased mobility;Decreased range of motion;Decreased knowledge of precautions;Decreased activity tolerance;Decreased cognition;Decreased balance;Decreased knowledge of use of DME;Pain       PT Treatment Interventions Therapeutic activities;Cognitive remediation;Therapeutic  exercise;Patient/family education;Functional mobility training;Balance training    PT Goals (Current goals can be found in the Care Plan section)  Acute Rehab PT Goals Patient Stated Goal: to get stronger PT Goal Formulation: With patient/family Time For Goal Achievement: 12/18/20 Potential to Achieve Goals: Fair    Frequency Min 2X/week   Barriers to discharge Decreased caregiver support      Co-evaluation PT/OT/SLP Co-Evaluation/Treatment: Yes Reason for Co-Treatment: Complexity of the patient's impairments (multi-system involvement);For patient/therapist safety PT goals addressed during session: Mobility/safety with mobility OT goals addressed during session: ADL's and self-care       AM-PAC PT "6 Clicks" Mobility  Outcome Measure Help needed turning from your back to your side while in a flat bed without using bedrails?: Total Help needed moving from lying on your back to sitting on the side of a flat bed without using bedrails?: Total Help needed moving to and from a bed to a chair (including a wheelchair)?: Total Help needed standing up from a chair using your arms (e.g., wheelchair or bedside chair)?: Total Help needed to walk in hospital room?: Total Help needed climbing 3-5 steps with a railing? : Total 6 Click Score: 6    End of Session Equipment Utilized During Treatment: Oxygen Activity Tolerance:  Patient limited by fatigue;Patient tolerated treatment well;Patient limited by pain Patient left: in bed;with call bell/phone within reach;with bed alarm set;with family/visitor present Nurse Communication: Mobility status PT Visit Diagnosis: Muscle weakness (generalized) (M62.81);Pain Pain - Right/Left: Right Pain - part of body: Ankle and joints of foot    Time: 3794-3276 PT Time Calculation (min) (ACUTE ONLY): 35 min   Charges:   PT Evaluation $PT Eval Moderate Complexity: [place] Pager  [phone] Office [phone]   Claretha Cooper 12/04/2020, 4:47 PM

## 2020-12-04 NOTE — Plan of Care (Signed)
?  Problem: Clinical Measurements: ?Goal: Ability to maintain clinical measurements within normal limits will improve ?Outcome: Progressing ?Goal: Will remain free from infection ?Outcome: Progressing ?Goal: Diagnostic test results will improve ?Outcome: Progressing ?  ?

## 2020-12-04 NOTE — Evaluation (Signed)
Occupational Therapy Evaluation Patient Details Name: Andre Glass MRN: [id number] DOB: 06-17-1950 Today's Date: 12/04/2020    History of Present Illness The pt is a 70 yo male presenting on 8/24 and admitted with possible malignant stricture was progressive and intra-abdominal metastatic disease, extensive osseous metastatic disease, evidence of bilateral lower extremity DVT and pelvic adenopathy. patient was also found to have fecal impaction with GI bleed impacting DVT treament.patient underwent ex lap, sigmoid colectomy,and end colostomy on 8/25.  PMH: reccently admitted for obstructive uropathy with bilateral hydronephrosis, s/p placement of percutaneous nephrostomy tubes bilaterally,UTI metastatic adenocarinoma of prostate, chornic pain, HTN, and CKD.   Clinical Impression   Patient is a 70 year old male who was admitted for above. Patient was previously completing ADLs with rollator level with care attendant for 2 hours 5x a week.  Patient is currently TD for All ADLs with increased pain, decreased activity tolerance, decreased endurance, decreased time out of bed impacting patients ability to participate in ADLs. Patient would continue to benefit from skilled OT services at this time while admitted and after d/c to address noted deficits in order to improve overall safety and independence in ADLs.      Follow Up Recommendations  SNF (pending progress during acute stay)    Equipment Recommendations  Other (comment) (defer to next venue)    Recommendations for Other Services       Precautions / Restrictions Precautions Precautions: Fall Precaution Comments: monitor O2,central line, soft spoken, JP drains Restrictions Weight Bearing Restrictions: No      Mobility Bed Mobility Overal bed mobility: Needs Assistance Bed Mobility: Rolling Rolling: +2 for physical assistance;Max assist              Transfers                      Balance Overall balance assessment:  Needs assistance                                         ADL either performed or assessed with clinical judgement   ADL Overall ADL's : Needs assistance/impaired Eating/Feeding: Moderate assistance;Bed level Eating/Feeding Details (indicate cue type and reason): patient and family were educated on how to lessen work for patient to participate in self feeding and maintain intake levels. patient was able to hold bottle of drink provided by nursing for proceedure later on this date with BUE and bring to mouth. patient required incresed time for swallowing as per notes from SLP in room. Grooming: Oral care;Wash/dry face;Minimal assistance;Bed level   Upper Body Bathing: Maximal assistance;Bed level   Lower Body Bathing: Total assistance;Bed level   Upper Body Dressing : Maximal assistance;Bed level   Lower Body Dressing: Total assistance;Bed level   Toilet Transfer: +2 for safety/equipment;+2 for physical assistance;Total assistance Toilet Transfer Details (indicate cue type and reason): patient unable to tolerate sitting on edge of bed today. chair position in bed was trialed with increased time to move into postion with increased pain in BLE with any movement. Toileting- Clothing Manipulation and Hygiene: Total assistance;Bed level       Functional mobility during ADLs: +2 for safety/equipment;+2 for physical assistance;Total assistance General ADL Comments: patient was eager to try to participate in therapy with increased time to move bed into chair positioning. patient was reporting increased pain in BLE with nursing. patient reported increased low back pain with  upright positioning. patient and family weree ducated on gravity impacting back as well in chair positioning. patient and family verbalized understanding.     Vision Patient Visual Report: No change from baseline       Perception     Praxis      Pertinent Vitals/Pain Pain Assessment: Faces Faces Pain  Scale: Hurts even more Pain Location: BLE Pain Descriptors / Indicators: Discomfort;Grimacing Pain Intervention(s): Limited activity within patient's tolerance;Monitored during session;Repositioned     Hand Dominance Right   Extremity/Trunk Assessment Upper Extremity Assessment Upper Extremity Assessment: Generalized weakness (patient had no indications of pain in BUE. patient was noted to have incresed fatigue with attempts to use BUE. patient was able to move BUE against gravity to full ROM with limitation on R side secondary to central line in place.)   Lower Extremity Assessment Lower Extremity Assessment: Defer to PT evaluation       Communication Communication Communication: No difficulties   Cognition Arousal/Alertness: Awake/alert Behavior During Therapy: WFL for tasks assessed/performed Overall Cognitive Status: Within Functional Limits for tasks assessed                                 General Comments: patients family was present in room. patient is very soft spoken.   General Comments       Exercises     Shoulder Instructions      Home Living Family/patient expects to be discharged to:: Unsure Living Arrangements: Other relatives (per chart review) Available Help at Discharge: Family;Personal care attendant Type of Home: House       Home Layout: One level     Bathroom Shower/Tub: Occupational psychologist: Handicapped height     Home Equipment: Environmental consultant - 4 wheels;Walker - 2 wheels;Cane - quad;Shower seat;Grab bars - tub/shower;Bedside commode;Wheelchair - manual;Hospital bed   Additional Comments: caregiver 2 hrs per day 5x/wk      Prior Functioning/Environment Level of Independence: Needs assistance  Gait / Transfers Assistance Needed: reports ambulation with rollator within the house and for community distances, [facility] for doctors apt ( per chart review) ADL's / Homemaking Assistance Needed: per chart review, patient has care  attendant that assists with meals and showering   Comments: amb with cane or RW        OT Problem List: Decreased strength;Decreased range of motion;Decreased activity tolerance;Impaired balance (sitting and/or standing);Pain;Decreased knowledge of use of DME or AE      OT Treatment/Interventions: Self-care/ADL training;Energy conservation;DME and/or AE instruction;Therapeutic activities;Balance training;Patient/family education;Therapeutic exercise    OT Goals(Current goals can be found in the care plan section) Acute Rehab OT Goals Patient Stated Goal: to get stronger OT Goal Formulation: With patient/family Time For Goal Achievement: 12/18/20 Potential to Achieve Goals: Fair  OT Frequency: Min 2X/week   Barriers to D/C:            Co-evaluation PT/OT/SLP Co-Evaluation/Treatment: Yes Reason for Co-Treatment: To address functional/ADL transfers;Complexity of the patient's impairments (multi-system involvement) PT goals addressed during session: Mobility/safety with mobility OT goals addressed during session: ADL's and self-care      AM-PAC OT "6 Clicks" Daily Activity     Outcome Measure Help from another person eating meals?: A Lot Help from another person taking care of personal grooming?: A Lot Help from another person toileting, which includes using toliet, bedpan, or urinal?: Total Help from another person bathing (including washing, rinsing, drying)?: Total Help from another person  to put on and taking off regular upper body clothing?: A Lot Help from another person to put on and taking off regular lower body clothing?: Total 6 Click Score: 9   End of Session Nurse Communication: Other (comment) (nurse cleared patient to participate.)  Activity Tolerance: Patient limited by lethargy;Patient limited by pain Patient left: in bed;with call bell/phone within reach;with family/visitor present  OT Visit Diagnosis: Muscle weakness (generalized) (M62.81);Pain Pain - part  of body:  (BLE)                Time: 9102-8902 OT Time Calculation (min): 34 min Charges:  OT General Charges $OT Visit: 1 Visit OT Evaluation $OT Eval High Complexity: 1 High  Jackelyn Poling OTR/L, MS Acute Rehabilitation Department Office# [phone] Pager# [phone]   [place] 12/04/2020, 2:19 PM

## 2020-12-04 NOTE — Progress Notes (Signed)
ANTICOAGULATION CONSULT NOTE   Pharmacy Consult for heparin Indication: recent bilateral DVT on 8/17 (home xarelto on hold)  Allergies  Allergen Reactions   Ibuprofen Anaphylaxis   Shrimp [Shellfish Allergy] Anaphylaxis   Patient Measurements: wt 83kg, height 68 inches Height: 5\' 8"  (172.7 cm) Weight: 93 kg (205 lb 0.4 oz) IBW/kg (Calculated) : 68.4 Heparin Dosing Weight: 83 kg  Vital Signs: Temp: 98 F (36.7 C) (08/31 0443) Temp Source: Oral (08/31 0443) BP: 134/72 (08/31 0443) Pulse Rate: 99 (08/31 0443)  Labs: Recent Labs    12/02/20 0353 12/02/20 2000 12/03/20 0309 12/03/20 0842 12/03/20 1048 12/03/20 1950 12/04/20 0327  HGB 8.9*  --  8.6*  --   --   --  7.8*  HCT 28.3*  --  26.9*  --   --   --  25.0*  PLT 181  --  258  --   --   --  272  HEPARINUNFRC <0.10*   < > 0.20*  --  0.44 0.44 0.60  CREATININE 1.00  --   --  0.82  --   --  0.81   < > = values in this interval not displayed.   Estimated Creatinine Clearance: 93.9 mL/min (by C-G formula based on SCr of 0.81 mg/dL).  Medications:  - on xarelto dose pack PTA (on 15mg  bid - last dose taken on 11/26/20 at [facility])  Assessment: Patient is a 70 y.o M with metastatic prostate cancer currently undergoing chemotherapy treatment and on xarelto PTA for bilateral DVTs that was diagnosed on 11/20/20, presented to the ED on 11/27/20 with c/o abdominal pain. Surgery and GI consulted for fecal impaction. Pharmacy has been consulted to transition patient to heparin drip in case invasive intervention is needed with this admission. Baseline aPTT 34 sec, heparin level is elevated at 0.91--> is elevated d/t effect of xarelto  Patient taken to OR on 8/25 for sigmoid colectomy and end colostomy placement due to concern for large bowel perforation in setting of fecal impaction. Heparin was held and resumed 8/26 @ 0200. 8/28 am Heparin held for bleeding noted from nephrostomy tubes and colostomy  Today, 12/04/2020: 0330 Hep level 0.6  units/ml on 2000 units/hr Hemoglobin steadily downtrending (11.1 >> 8.9 >> 7.8), Platelets stable.  Heparin stopped at 0514, new midline bleeding  Goal of Therapy:  Heparin level 0.3-0.7 units/ml aPTT 66-102 seconds Monitor platelets by anticoagulation protocol: Yes   Plan:  Await resumption or hold of Heparin Daily heparin level & CBC Monitor for s/sx bleeding  F/u ability to transition back to Laurita Quint PharmD [facility] [phone] 12/04/2020, 2:50 PM

## 2020-12-04 NOTE — Progress Notes (Signed)
PROGRESS NOTE    Andre Glass  PYP:[id number] DOB: 1951-01-24 DOA: 11/27/2020 PCP: [name], DO  Brief Narrative:  The patient is a 70 year old African-American male with a past medical history significant for but not limited to metastatic prostate cancer with resultant obstructive uropathy requiring bilateral nephrostomy tube placement with other comorbidities who presented with worsening right-sided abdominal pain that started on the morning of yesterday.  The pain was found to be in the right lower quadrant and was a sharp 10 out of 10 in severity.  Patient dates that he passed gas yesterday and had several loose bowel movements but states nothing made the pain worse or better.  He recently had a CT scan on 11/20/2020 which showed possible malignant stricture was progressive and intra-abdominal metastatic disease and extensive osseous metastatic disease as well as evidence of bilateral lower extremity DVT and pelvic veins being compressed by pelvic adenopathy.  He had right peroneal DVT and acute left femoral DVT extending through the veins of the left lower leg and vascular surgery was consulted at that time and recommended no intervention discharged on anticoagulation with Xarelto.  He is currently on amoxicillin and Trulance without relief.  He was given fleets enema without relief in the ED.  He was also noted to have a hemoglobin of 6.6 down from 7.5 a few days ago.  He was transfused 1 unit PRBCs and general surgery as well as gastroenterology were consulted and they recommended bowel regimen and no surgical intervention unless the patient did not improve however subsequently after his enema today the patien likely perforated secondary to history of colon ulcer and had rigidity and guarding consistent with peritonitis.  General surgery is taking him to the OR for emergent surgery for a laparotomy, partial colectomy and colostomy and he is postoperative day 1 for exploratory laparotomy, sigmoid  colectomy and end colostomy with Dr. Yancey Flemings on 11/28/2020.  Findings intraoperatively showed a large perforation with diffuse feculent peritonitis and extensive contamination of the peritoneal cavity with stool and pus  General surgery feels that he is at very high risk for abscess formation and they are recommending following his labs closely and consider repeating a scan in next few days.  They are recommending continuing NG tube to low intermittent suction and await viable bowel function.  He does have some colostomy output.  Wound care has been consulted and he has been getting wet-to-dry dressings twice daily and pain control with IV acetaminophen.  His electrolytes and labs are been off so we will stop his potassium supplementation and change his fluid to D5W and give him a dose of Lokelma  Today his blood count dropped a little bit and he started having some blood from his nephrostomy in his colostomy so we will hold his heparin drip.  Surgery recommending continuing NG tube to low intermittent suction.  Continued to hold heparin drip given that his hemoglobin continues to drop and now that he has some blood in his nephrostomy in his colostomy.  However his hemoglobin has improved and his nephrostomy tubes had minimal blood in him so heparin drip was resumed.  He does have some blood-tinged urine now on his nephrostomies. Medical oncology evaluated and feel that his overall prognosis from process cancer General guarded with limited life expectancy.  Oncology feels that if he is able to obtain meaningful recovery that they will resume chemotherapy however the odds of this is happening is very small.  He pulled out his NG tube again and  it was not replaced. General Surgery feels that he is still at risk for intra-abdominal abscesses given his intraoperative findings and they are waiting for improvement in his distention before giving liquids or food from a p.o. standpoint.   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 Windle Guard on 11/28/2020 laparotomies/ -Patient presents with a 3-day history of fecal impaction secondary to prostate cancer along with his home Dilaudid -He has tried senna and MiraLAX in the past with out relief -Patient takes Movantik and flecainide and he was given Colace and senna trial of tapwater enemas as well as lactulose enemas -GI was consulted and recommended enemas the way he takes them -After his enema today he became extremely hypotensive and had some guarding and rigidity of his abdomen and became diaphoretic -Stat KUB showed possible free air -General surgery was reconsulted and took the patient to the OR emergently -He is postoperative day 5 now and he is at very high risk for abscess formation and general surgery recommends continue to follow his labs and exam closely and consider repeating a CT scan in next few days -Wound care has been consulted -He is getting IV fluid hydration with normal saline 100 MLS per hour -Patient's NG tube has been removed and will hold off for now given that he continues to put out nightly -Patient's surgical pathology is back and it showed metastatic prostate cancer in the nodes -General surgery thinks his ostomy is tenuous but is working they are recommending continue IV antibiotics with IV Zosyn and JP drain for now; continue to monitor and may need a repeat CT scan soon -Palliative care has been consulted for goals of care discussion and appreciate their evaluation and he was not up to speaking  with Dr. Domingo Cocking yesterday and plans remain for aggressive care.  Patient still wants full scope of care.  Metastatic prostate cancer with subsequent pain -Continue with Tylenol, hydrocodone and Dilaudid -General surgery started him on IV acetaminophen -Medical oncology was consulted and they are recommending that if he has any meaningful recovery that they can resume chemotherapy but they feel this is small and they feel that his prognosis is guarded  Right Leg DVT -Currently on heparin infusion -Restart on Xarelto when cleared by general surgery  AKI Metabolic Acidosis -Patient's BUNs/creatinine went from 13/0.82 -> 17/1.16 -> 28/1.37 -> 28/1.16 -> 27/1.00 -> 22/0.82 -Patient continues to have a very mild metabolic acidosis with a CO2 of 19, Anion GAP of 9, chloride level of 108 -We will change his IV fluids further and add normal saline with 40 mill colons of KCl to his D5 given that he is now hypokalemia -Avoid nephrotoxic medications, contrast dyes, hypotension renally dose medications -Repeat CMP in the a.m.  Hypokalemia -Potassium dropped to 2.9 -Replete with IV KCl 40 mEq as well as IV K-Phos 10 mmol and D5 normal saline with 40 mEQ of KCl at 75 mL/hr -Continue to monitor and replete as necessary -Repeat CMP in a.m.  Hypophosphatemia -Mild with a Phos of 2.4 -Replete with IV K-Phos 10 mmol -Continue monitor and treat as necessary -Repeat phosphorus  level in a.m.  Acute on chronic anemia likely of chronic disease, trending down further -Hemoglobin had initially declined to 6.6 and he was transfused 2 units PRBC. -Follow-up hemoglobin had improved -Continue to monitor and transfuse for hemoglobin less than 7.  Hematuria -In the setting of being on anticoagulation -Appears to have resolved when anticoagulation was transiently held -Seen by neurology with no further recommendations -Continue to monitor  Hypernatremia -Resolved with hypotonic  fluids.  Leukocytosis -WBC count currently trending up to 26.6 -With recent bowel surgery, concern for developing underlying abscess -Plans to repeat CT abdomen today -He is already on Zosyn, would continue the same   DVT prophylaxis: Anticoagulated with a heparin drip  Code Status: Full code Family Communication: Spoke with his brother Legrand Como at bedside Disposition Plan: Pending further clinical improvement and clearance by specialists  Status is: Inpatient  Remains inpatient appropriate because:Unsafe d/c plan, IV treatments appropriate due to intensity of illness or inability to take PO, and Inpatient level of care appropriate due to severity of illness  Dispo: The patient is from: Home              Anticipated d/c is to: SNF              Patient currently is not medically stable to d/c.   Difficult to place patient No  Consultants:  General surgery Gastroenterology Palliative care medicine Urology Medical Oncology  Procedures: Emergent surgery: Procedure performed: Exploratory laparotomy, sigmoid colectomy with end colostomy  Antimicrobials: Anti-infectives (From admission, onward)    Start     Dose/Rate Route Frequency Ordered Stop   11/28/20 2230  piperacillin-tazobactam (ZOSYN) IVPB 3.375 g        3.375 g 12.5 mL/hr over 240 Minutes Intravenous Every 8 hours 11/28/20 2121     11/28/20 2215  piperacillin-tazobactam (ZOSYN) IVPB 3.375 g  Status:  Discontinued       Note to Pharmacy: Given Intraoperative   3.375 g 100 mL/hr over 30 Minutes Intravenous Every 8 hours 11/28/20 2117 11/28/20 2121   11/28/20 1730  piperacillin-tazobactam (ZOSYN) IVPB 3.375 g       Note to Pharmacy: Given Intraoperative   3.375 g 100 mL/hr over 30 Minutes Intravenous  Once 11/28/20 1729 11/28/20 1750   11/28/20 1712  piperacillin-tazobactam (ZOSYN) 3.375 GM/50ML IVPB       Note to Pharmacy: Marchia Meiers   : cabinet override      11/28/20 1712 11/28/20 1735   11/28/20 1545   ceFAZolin (ANCEF) IVPB 2g/100 mL premix        2 g 200 mL/hr over 30 Minutes Intravenous  Once 11/28/20 1538 11/28/20 1630   11/28/20 1539  ceFAZolin (ANCEF) 2-4 GM/100ML-% IVPB       Note to Pharmacy: Minor, Anneita   : cabinet override      11/28/20 1539 11/28/20 1654   11/27/20 1700  cephALEXin (KEFLEX) capsule 500 mg  Status:  Discontinued        500 mg Oral 3 times daily 11/27/20 1640 11/28/20 2117        Subjective: Patient whispers all talking.  Reports that abdominal pain is still present, but better after receiving pain medications.  He had several bowel movements overnight.  Describes continued nausea, but no vomiting.  Objective: Vitals:   12/03/20 1411 12/03/20 2005 12/04/20 0443 12/04/20 0500  BP: 118/62 117/71 134/72   Pulse: 98 (!) 105 99   Resp: 16 18 16    Temp: 97.7 F (36.5 C) 97.9 F (  36.6 C) 98 F (36.7 C)   TempSrc: Oral Oral Oral   SpO2: 96% 99% 100%   Weight:    93 kg  Height:        Intake/Output Summary (Last 24 hours) at 12/04/2020 1828 Last data filed at 12/04/2020 1743 Gross per 24 hour  Intake 2100.92 ml  Output 1230 ml  Net 870.92 ml   Filed Weights   12/02/20 0256 12/03/20 0505 12/04/20 0500  Weight: 92.7 kg 90.4 kg 93 kg   Examination: Physical Exam:  Constitutional: WN/WD, chronically ill-appearing African-American male who appears uncomfortable and complaining of some left-sided pain Eyes: Lids and conjunctivae normal, sclerae anicteric  ENMT: External Ears, Nose appear normal. Grossly normal hearing.  Has a raspy voice Neck: Appears normal, supple, no cervical masses, normal ROM, no appreciable thyromegaly; no appreciable JVD Respiratory: Diminished to auscultation bilaterally, no wheezing, rales, rhonchi or crackles. Normal respiratory effort and patient is not tachypenic. No accessory muscle use.  Unlabored breathing Cardiovascular: RRR, no murmurs / rubs / gallops. S1 and S2 auscultated.  Has 1-2+ lower extremity edema Abdomen:  Soft, tender to palpate, distended secondary body habitus and he has a colostomy in place with a JP drain on the right.  Brown stool noted in ostomy.  No masses palpated.  Bowel sounds positive.  GU: Nephrostomy is noted with some bloody drainage bilaterally with coarse bloody drainage on the left compared to right Musculoskeletal: No clubbing / cyanosis of digits/nails. No joint deformity upper and lower extremities.  Skin: No rashes, lesions, ulcers on limited skin evaluation. No induration; Warm and dry.  Neurologic: CN 2-12 grossly intact with no focal deficits. Romberg sign and cerebellar reflexes not assessed.  Psychiatric: Normal judgment and insight.  He is awake and alert.  Appears very anxious and depressed  Data Reviewed: I have personally reviewed following labs and imaging studies  CBC: Recent Labs  Lab 11/29/20 0352 11/30/20 0346 12/01/20 0356 12/02/20 0353 12/03/20 0309 12/04/20 0327  WBC 11.3* 20.7* 18.4* 21.0* 21.8* 26.6*  NEUTROABS 10.3*  --   --   --   --  21.7*  HGB 11.1* 9.8* 8.5* 8.9* 8.6* 7.8*  HCT 35.2* 31.5* 27.2* 28.3* 26.9* 25.0*  MCV 93.4 93.2 93.8 93.4 92.4 93.3  PLT 213 193 181 181 258 591   Basic Metabolic Panel: Recent Labs  Lab 11/30/20 0346 11/30/20 1804 12/01/20 0356 12/02/20 0353 12/03/20 0842 12/04/20 0327  NA 147* 141 141 140 136 142  K 5.8* 4.7 4.2 3.5 2.9* 3.8  CL 120* 115* 116* 113* 108 115*  CO2 19* 19* 19* 23 19* 19*  GLUCOSE 167* 100* 82 73 85 116*  BUN 28* 29* 28* 27* 22 14  CREATININE 1.37* 1.31* 1.16 1.00 0.82 0.81  CALCIUM 8.0* 7.8* 7.9* 7.9* 7.4* 7.6*  MG 2.2  --  2.1 2.1 2.0 2.0  PHOS 3.9  --  3.0 2.9 2.4* 2.3*   GFR: Estimated Creatinine Clearance: 93.9 mL/min (by C-G formula based on SCr of 0.81 mg/dL). Liver Function Tests: Recent Labs  Lab 11/30/20 1804 12/01/20 0356 12/02/20 0353 12/03/20 0842 12/04/20 0327  AST 26 28 41 36 33  ALT 10 9 15 18 14   ALKPHOS 85 87 116 119 119  BILITOT 0.5 0.5 0.9 1.0 0.9   PROT 5.1* 4.9* 5.1* 4.8* 5.1*  ALBUMIN 1.9* 1.7* 1.7* 1.5* 1.4*   No results for input(s): LIPASE, AMYLASE in the last 168 hours. No results for input(s): AMMONIA in the last 168 hours.  Coagulation Profile: No results for input(s): INR, PROTIME in the last 168 hours. Cardiac Enzymes: No results for input(s): CKTOTAL, CKMB, CKMBINDEX, TROPONINI in the last 168 hours. BNP (last 3 results) No results for input(s): PROBNP in the last 8760 hours. HbA1C: No results for input(s): HGBA1C in the last 72 hours. CBG: No results for input(s): GLUCAP in the last 168 hours. Lipid Profile: No results for input(s): CHOL, HDL, LDLCALC, TRIG, CHOLHDL, LDLDIRECT in the last 72 hours. Thyroid Function Tests: No results for input(s): TSH, T4TOTAL, FREET4, T3FREE, THYROIDAB in the last 72 hours. Anemia Panel: No results for input(s): VITAMINB12, FOLATE, FERRITIN, TIBC, IRON, RETICCTPCT in the last 72 hours. Sepsis Labs: No results for input(s): PROCALCITON, LATICACIDVEN in the last 168 hours.  Recent Results (from the past 240 hour(s))  Resp Panel by RT-PCR (Flu A&B, Covid) Nasopharyngeal Swab     Status: None   Collection Time: 11/27/20  1:51 PM   Specimen: Nasopharyngeal Swab; Nasopharyngeal(NP) swabs in vial transport medium  Result Value Ref Range Status   SARS Coronavirus 2 by RT PCR NEGATIVE NEGATIVE Final    Comment: (NOTE) SARS-CoV-2 target nucleic acids are NOT DETECTED.  The SARS-CoV-2 RNA is generally detectable in upper respiratory specimens during the acute phase of infection. The lowest concentration of SARS-CoV-2 viral copies this assay can detect is 138 copies/mL. A negative result does not preclude SARS-Cov-2 infection and should not be used as the sole basis for treatment or other patient management decisions. A negative result may occur with  improper specimen collection/handling, submission of specimen other than nasopharyngeal swab, presence of viral mutation(s) within  the areas targeted by this assay, and inadequate number of viral copies(<138 copies/mL). A negative result must be combined with clinical observations, patient history, and epidemiological information. The expected result is Negative.  Fact Sheet for Patients:  EntrepreneurPulse.com.au  Fact Sheet for Healthcare Providers:  IncredibleEmployment.be  This test is no t yet approved or cleared by the [place] FDA and  has been authorized for detection and/or diagnosis of SARS-CoV-2 by FDA under an Emergency Use Authorization (EUA). This EUA will remain  in effect (meaning this test can be used) for the duration of the COVID-19 declaration under Section 564(b)(1) of the Act, 21 U.S.C.section 360bbb-3(b)(1), unless the authorization is terminated  or revoked sooner.       Influenza A by PCR NEGATIVE NEGATIVE Final   Influenza B by PCR NEGATIVE NEGATIVE Final    Comment: (NOTE) The Xpert Xpress SARS-CoV-2/FLU/RSV plus assay is intended as an aid in the diagnosis of influenza from Nasopharyngeal swab specimens and should not be used as a sole basis for treatment. Nasal washings and aspirates are unacceptable for Xpert Xpress SARS-CoV-2/FLU/RSV testing.  Fact Sheet for Patients: EntrepreneurPulse.com.au  Fact Sheet for Healthcare Providers: IncredibleEmployment.be  This test is not yet approved or cleared by the [place] FDA and has been authorized for detection and/or diagnosis of SARS-CoV-2 by FDA under an Emergency Use Authorization (EUA). This EUA will remain in effect (meaning this test can be used) for the duration of the COVID-19 declaration under Section 564(b)(1) of the Act, 21 U.S.C. section 360bbb-3(b)(1), unless the authorization is terminated or revoked.  Performed at [facility], [place] [address] [place], [place] [address]   Urine Culture     Status: None    Collection Time: 11/27/20  3:25 PM   Specimen: Kidney; Urine  Result Value Ref Range Status   Specimen Description   Final  KIDNEY LEFT NEPHROSTOMY Performed at [place] [address], [place], [place] [address]    Special Requests   Final    NONE Performed at [facility], [place] [address] [place], [place] [address]    Culture   Final    NO GROWTH Performed at [facility], [place] [address], [place], [place] [address]    Report Status 11/29/2020 FINAL  Final  Urine Culture     Status: None   Collection Time: 11/27/20  3:25 PM   Specimen: Kidney; Urine  Result Value Ref Range Status   Specimen Description   Final    KIDNEY RIGHT NEPHROSTOMY Performed at [place] [address] [place], [place] [address]    Special Requests   Final    NONE Performed at [facility], [place] [address] [place], [place] [address]    Culture   Final    NO GROWTH Performed at [facility], [place] [address] [place], [place] [address]    Report Status 11/29/2020 FINAL  Final      RN Pressure Injury Documentation:     Estimated body mass index is 31.17 kg/m as calculated from the following:   Height as of this encounter: 5\' 8"  (1.727 m).   Weight as of this encounter: 93 kg.  Malnutrition Type:  Nutrition Problem: Increased nutrient needs Etiology: cancer and cancer related treatments  Malnutrition Characteristics:  Signs/Symptoms: estimated needs  Nutrition Interventions:  Interventions: Refer to RD note for recommendations   Radiology Studies: CT ABDOMEN PELVIS W CONTRAST  Result Date: 12/04/2020 CLINICAL DATA:  Intra-abdominal abscess, history of stercoral ulceration and perforation of the sigmoid colon, status post Hartmann procedure sigmoid colon resection and colostomy, metastatic prostate cancer EXAM: CT ABDOMEN AND PELVIS WITH CONTRAST TECHNIQUE: Multidetector CT imaging of the  abdomen and pelvis was performed using the standard protocol following bolus administration of intravenous contrast. CONTRAST:  68mL OMNIPAQUE IOHEXOL 350 MG/ML SOLN, additional oral enteric contrast COMPARISON:  11/27/2020 FINDINGS: Lower chest: Moderate, right greater than left pleural effusions associated atelectasis or consolidation. Hepatobiliary: No solid liver abnormality is seen. No gallstones, gallbladder wall thickening, or biliary dilatation. Pancreas: Unremarkable. No pancreatic ductal dilatation or surrounding inflammatory changes. Spleen: Normal in size without significant abnormality. Adrenals/Urinary Tract: Adrenal glands are unremarkable. Bilateral percutaneous nephrostomy tubes remain in position formed pigtails in the renal pelves. Bilateral double-J ureteral stent catheter remain in position with formed pigtails in the renal pelves and urinary bladder. Severely thickened urinary bladder. No hydronephrosis. Stomach/Bowel: Stomach is within normal limits. Appendix appears normal. No evidence of bowel wall thickening, distention, or inflammatory changes. Vascular/Lymphatic: Scattered aortic atherosclerosis. Multiple enlarged retroperitoneal, bilateral iliac, and pelvic sidewall lymph nodes, unchanged compared to prior examination. Reproductive: Gross prostatomegaly. Other: Status post interval midline laparotomy. Surgical drain about the low abdomen. There is an air and fluid collection about the surgical drain in the left lower quadrant, measuring approximately 5.2 x 3.1 cm (series 2, image 81). Anasarca, worsened compared to prior examination. Small volume ascites, increased compared to prior examination, loculated appearing areas fluid, particularly about the posterior liver (series 2, image 29) and spleen (series 2, image 26). Musculoskeletal: Multiple sclerotic osseous metastases, unchanged. IMPRESSION: 1. Status post interval midline laparotomy with sigmoid colon resection and left lower  quadrant end colostomy. 2. Surgical drain about the low abdomen. There is an air and fluid collection about the surgical drain in the left lower quadrant, measuring approximately  5.2 x 3.1 cm, concerning for abscess. 3. Small volume ascites, increased compared to prior examination, with loculated appearing areas of fluid about the abdomen. 4. Bilateral percutaneous nephrostomy tubes and double-J ureteral stent catheter remain in position with formed pigtails. 5. Prostatomegaly with metastatic lymphadenopathy and osseous metastatic disease, unchanged compared to recent. 6. Anasarca, worsened compared to prior examination. 7. Moderate, right greater than left pleural effusions, worsened compared to prior examination. Aortic Atherosclerosis (ICD10-I70.0). Electronically Signed   By: Eddie Candle M.D.   On: 12/04/2020 13:28    Scheduled Meds:  acetaminophen (TYLENOL) oral liquid 160 mg/5 mL  650 mg Oral Q6H   vitamin C  1,000 mg Oral Daily   chlorhexidine  15 mL Mouth Rinse BID   Chlorhexidine Gluconate Cloth  6 each Topical Daily   docusate sodium  100 mg Oral BID   mouth rinse  15 mL Mouth Rinse q12n4p   mirtazapine  7.5 mg Oral QHS   multivitamin with minerals  1 tablet Oral Daily   naloxegol oxalate  12.5 mg Oral Daily   Plecanatide  3 mg Oral Daily   predniSONE  5 mg Oral Q breakfast   pregabalin  50 mg Oral BID   Continuous Infusions:  heparin Stopped (12/04/20 0514)   methocarbamol (ROBAXIN) IV 500 mg (12/04/20 1500)   piperacillin-tazobactam (ZOSYN)  IV 3.375 g (12/04/20 1454)    LOS: 7 days   Kathie Dike, MD [facility] PAGER is on AMION  If 7PM-7AM, please contact night-coverage [email or web address]

## 2020-12-04 NOTE — Progress Notes (Signed)
Palliative Care Progress Note, Assessment & Plan   Patient Name: Andre Glass       Date: 12/04/2020 DOB: 05/20/1950  Age: 70 y.o. MRN#: [id number] Attending Physician: Kathie Dike, MD Primary Care Physician: [name], DO Admit Date: 11/27/2020  Reason for Consultation/Follow-up: Establishing goals of care  Subjective: Patient is sitting in bed in NAD. He whispers responses and is alert. There are two non-family visitors at bedside.  HPI: Patient is a 70 y.o. 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 He subsequently had obstructive uropathy which required bilateral nephrostomy tube placement on 6/26 with replacement on 7/30 . Pt presented to ED on 8/25 with 10/10 abdominal pain on RLQ.    When compared to CT on 8/17, CT in ED revealed possible malignant stricture was progressive and intra-abdominal metastatic disease and extensive osseous metastatic disease as well as evidence of bilateral lower extremity DVT and pelvic veins being compressed by pelvic adenopathy.    Patient also presented with a fecal impaction and s/p edema likely perforated. General surgery performed ex lap with sigmoid colectomy and end colostomy. Findings intraoperatively showed a large perforation with diffuse feculent peritonitis and extensive contamination of the peritoneal cavity with stool and pus.   Today marks POD6. Pt is not tolerating swallowing medications either whole or crushed in applesauce. His oral intake continues to be poor. Pain is better controlled on IV opioids prn.    He is at risk for intra-abdominal abscesses and general surgery ordered a CT of abdomen today. Results pending.   Plan of Care: I have  reviewed medical records including EPIC notes, labs and imaging, received report from bedside RN Jerene Pitch, assessed the patient and then met with patient and two friends at bedside to discuss diagnosis prognosis, GOC, EOL wishes, disposition and options.  I asked and was given permission by the patient to discuss patient's health status in front of the two visitors.   I attempted to elicit values and goals of care important to the patient. He did not participate in the discussion. I reviewed his code status, resuscitation, and ventilator support. I described what perhaps a code blue would look like for the patient in light of his anticoagulation status, recent surgery, and co-morbidities. He nodded that he understood and heard what I was saying. When asked if he had any comments, concerns or thoughts regarding his code status he remained silent.  The difference between aggressive medical intervention and comfort care was again reviewed. I reiterated that general surgery is the primary team and they awaiting the results of the CT of abdomen today to discuss treatment options. Palliative Medicine will continue to follow the patient through his disease trajectory and hospitalization.   Discussed with patient and friends at bedside the importance of continued conversation with family and the medical providers regarding overall plan of care and treatment options, ensuring decisions are within the context of the patient's values and GOCs.    Questions and concerns were addressed. The patient was encouraged to call with questions or concerns.  Code Status: Full code  Prognosis:  Unable to determine  Discharge Planning: To Be  Determined  Recommendations/Plan: Continue current measures Full code/Full scope  Care plan was discussed with patient, PA [facility] [employer]  Length of Stay: 7  Physical Exam Vitals and nursing note reviewed.  HENT:     Head: Normocephalic and atraumatic.  Cardiovascular:      Rate and Rhythm: Normal rate.  Pulmonary:     Effort: Pulmonary effort is normal.     Comments: Weak, non-productive cough Abdominal:     Tenderness: There is generalized abdominal tenderness.  Skin:    General: Skin is warm and dry.  Neurological:     Mental Status: He is alert and oriented to person, place, and time.  Psychiatric:        Mood and Affect: Mood normal.            Vital Signs: BP 134/72 (BP Location: Left Arm)   Pulse 99   Temp 98 F (36.7 C) (Oral)   Resp 16   Ht '5\' 8"'  (1.727 m)   Wt 93 kg   SpO2 100%   BMI 31.17 kg/m  SpO2: SpO2: 100 % O2 Device: O2 Device: Room Air O2 Flow Rate: O2 Flow Rate (L/min): 2 L/min      Palliative Assessment/Data: 30%    Flowsheet Rows    Flowsheet Row Most Recent Value  Intake Tab   Referral Department Hospitalist  Unit at Time of Referral Med/Surg Unit  Palliative Care Primary Diagnosis Sepsis/Infectious Disease  Date Notified 11/27/20  Palliative Care Type New Palliative care  Reason for referral Clarify Goals of Care  Date of Admission 11/27/20  # of days IP prior to Palliative referral 0  Clinical Assessment   Psychosocial & Spiritual Assessment   Palliative Care Outcomes         Total Time 35 minutes Prolonged Time Billed  no   Greater than 50%  of this time was spent counseling and coordinating care related to the above assessment and plan.  Thank you for allowing the Palliative Medicine Team to assist in the care of this patient.  Loistine Chance MD, patient was seen along with Curt Bears L. Ilsa Iha, FNP-BC Palliative Medicine Team Team Phone # [phone]

## 2020-12-05 ENCOUNTER — Ambulatory Visit: Payer: Medicare Other | Admitting: Oncology

## 2020-12-05 ENCOUNTER — Ambulatory Visit: Payer: Medicare Other

## 2020-12-05 ENCOUNTER — Other Ambulatory Visit: Payer: Medicare Other

## 2020-12-05 ENCOUNTER — Inpatient Hospital Stay (HOSPITAL_COMMUNITY): Payer: Medicare Other

## 2020-12-05 DIAGNOSIS — E440 Moderate protein-calorie malnutrition: Secondary | ICD-10-CM | POA: Insufficient documentation

## 2020-12-05 LAB — HEPARIN LEVEL (UNFRACTIONATED): Heparin Unfractionated: 0.24 IU/mL — ABNORMAL LOW (ref 0.30–0.70)

## 2020-12-05 LAB — CBC
HCT: 24 % — ABNORMAL LOW (ref 39.0–52.0)
Hemoglobin: 7.6 g/dL — ABNORMAL LOW (ref 13.0–17.0)
MCH: 30 pg (ref 26.0–34.0)
MCHC: 31.7 g/dL (ref 30.0–36.0)
MCV: 94.9 fL (ref 80.0–100.0)
Platelets: 378 10*3/uL (ref 150–400)
RBC: 2.53 MIL/uL — ABNORMAL LOW (ref 4.22–5.81)
RDW: 19.1 % — ABNORMAL HIGH (ref 11.5–15.5)
WBC: 31.7 10*3/uL — ABNORMAL HIGH (ref 4.0–10.5)
nRBC: 0.4 % — ABNORMAL HIGH (ref 0.0–0.2)

## 2020-12-05 LAB — RENAL FUNCTION PANEL
Albumin: 1.3 g/dL — ABNORMAL LOW (ref 3.5–5.0)
Anion gap: 7 (ref 5–15)
BUN: 13 mg/dL (ref 8–23)
CO2: 19 mmol/L — ABNORMAL LOW (ref 22–32)
Calcium: 7.7 mg/dL — ABNORMAL LOW (ref 8.9–10.3)
Chloride: 114 mmol/L — ABNORMAL HIGH (ref 98–111)
Creatinine, Ser: 0.77 mg/dL (ref 0.61–1.24)
GFR, Estimated: >60 mL/min (ref >60)
Glucose, Bld: 78 mg/dL (ref 70–99)
Phosphorus: 2.2 mg/dL — ABNORMAL LOW (ref 2.5–4.6)
Potassium: 3.8 mmol/L (ref 3.5–5.1)
Sodium: 140 mmol/L (ref 135–145)

## 2020-12-05 MED ORDER — GABAPENTIN 250 MG/5ML PO SOLN
200.0000 mg | Freq: Two times a day (BID) | ORAL | Status: DC
  Administered 2020-12-05 – 2020-12-11 (×13): 200 mg via ORAL
  Filled 2020-12-05 (×14): qty 4

## 2020-12-05 MED ORDER — ALBUMIN HUMAN 25 % IV SOLN
25.0000 g | Freq: Four times a day (QID) | INTRAVENOUS | Status: AC
  Administered 2020-12-05 – 2020-12-06 (×4): 25 g via INTRAVENOUS
  Filled 2020-12-05 (×4): qty 100

## 2020-12-05 MED ORDER — HEPARIN (PORCINE) 25000 UT/250ML-% IV SOLN
2000.0000 [IU]/h | INTRAVENOUS | Status: DC
  Administered 2020-12-06 (×2): 2000 [IU]/h via INTRAVENOUS
  Filled 2020-12-05: qty 250

## 2020-12-05 MED ORDER — METHOCARBAMOL 500 MG PO TABS
750.0000 mg | ORAL_TABLET | Freq: Three times a day (TID) | ORAL | Status: DC
  Administered 2020-12-05 (×3): 750 mg via ORAL
  Filled 2020-12-05 (×4): qty 2

## 2020-12-05 MED ORDER — HEPARIN (PORCINE) 25000 UT/250ML-% IV SOLN
1800.0000 [IU]/h | INTRAVENOUS | Status: DC
  Administered 2020-12-05: 1800 [IU]/h via INTRAVENOUS
  Filled 2020-12-05: qty 250

## 2020-12-05 MED ORDER — FUROSEMIDE 10 MG/ML IJ SOLN
20.0000 mg | Freq: Two times a day (BID) | INTRAMUSCULAR | Status: DC
  Administered 2020-12-05 – 2020-12-17 (×24): 20 mg via INTRAVENOUS
  Filled 2020-12-05 (×25): qty 2

## 2020-12-05 MED ORDER — FENTANYL CITRATE PF 50 MCG/ML IJ SOSY
25.0000 ug | PREFILLED_SYRINGE | Freq: Once | INTRAMUSCULAR | Status: AC
  Administered 2020-12-05: 25 ug via INTRAVENOUS
  Filled 2020-12-05: qty 1

## 2020-12-05 MED ORDER — PROSOURCE PLUS PO LIQD
30.0000 mL | Freq: Two times a day (BID) | ORAL | Status: DC
  Administered 2020-12-05 – 2020-12-20 (×24): 30 mL via ORAL
  Filled 2020-12-05 (×25): qty 30

## 2020-12-05 MED ORDER — BOOST / RESOURCE BREEZE PO LIQD CUSTOM
1.0000 | ORAL | Status: DC
  Administered 2020-12-06: 1 via ORAL

## 2020-12-05 NOTE — Progress Notes (Signed)
Palliative Care Progress Note  HPI: Patient is a 70 y.o. 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 He subsequently had obstructive uropathy which required bilateral nephrostomy tube placement on 6/26 with replacement on 7/30 . Pt presented to ED on 8/25 with 10/10 abdominal pain on RLQ.    When compared to CT on 8/17, CT in ED revealed possible malignant stricture was progressive and intra-abdominal metastatic disease and extensive osseous metastatic disease as well as evidence of bilateral lower extremity DVT and pelvic veins being compressed by pelvic adenopathy.    Patient also presented with a fecal impaction and s/p edema likely perforated. General surgery performed ex lap with sigmoid colectomy and end colostomy. Findings intraoperatively showed a large perforation with diffuse feculent peritonitis and extensive contamination of the peritoneal cavity with stool and pus.   Consultation and coordination of care  I visited with Mr. Cranshaw and his caregiver at bedside today. He was unable to work with PT. He continues to be unable to take POs except sips of water and ginger ale. He continues to whisper and not be able to speak easily. He was lethargic during my visit but appeared to hear and understand everything I was asking. I reviewed his pain regimen and he reported he is taking the pain medication when he feels the sharp pain in his stomach. I encouraged him to take the oral medication more consistently and to use the IV medications for breakthrough. He nodded in agreement but I am not sure how much he information he is really processing and comprehending.   Palliative Medicine will continue to follow Mr. Nester through his disease trajectory in this hospitalization to continue goals of care discussions.   Time: 15 minutes   Loistine Chance MD, patient was seen along with Curt Bears L. Ilsa Iha,  FNP-BC Palliative Medicine Team Team Phone # [phone] Pager # [phone]   Greater than 50%  of this time was spent counseling and coordinating care related to the above assessment and plan.

## 2020-12-05 NOTE — Plan of Care (Signed)
Pt seems very weak. [name] (CNA) and I tried to get him out of bed to the chair but pt could not stand and wanted back to bed. Pt asked for pain medication often today. Family visited today which uplifted patient's spirits.

## 2020-12-05 NOTE — Consult Note (Signed)
[facility] Nurse ostomy follow up Pouch placed 12/04/20 remains intact without need to be changed.  Stool and air in pouch.  Patient so weak at the time of my arrival, he did not even attempt to speak. Seemed he was having difficulty focusing on me and what I was saying as well.  I am signing the [facility] service off.  The patient cannot be taught, and it is highly doubtful in my mind, that he will discharge home.  I see in PT's note from yesterday their recommendation to discharge to a SNF.  Please re-consult if needed.  [facility] nurse will not follow at this time.  Please re-consult the [facility] team if needed.  Val Riles, RN, MSN, CWOCN, CNS-BC, pager [phone]

## 2020-12-05 NOTE — Progress Notes (Signed)
PROGRESS NOTE    Andre Glass  PJA:[id number] DOB: Oct 07, 1950 DOA: 11/27/2020 PCP: [name], DO  Brief Narrative:  The patient is a 70 year old African-American male with a past medical history significant for but not limited to metastatic prostate cancer with resultant obstructive uropathy requiring bilateral nephrostomy tube placement with other comorbidities who presented with worsening right-sided abdominal pain that started on the morning of yesterday.  The pain was found to be in the right lower quadrant and was a sharp 10 out of 10 in severity.  Patient dates that he passed gas yesterday and had several loose bowel movements but states nothing made the pain worse or better.  He recently had a CT scan on 11/20/2020 which showed possible malignant stricture was progressive and intra-abdominal metastatic disease and extensive osseous metastatic disease as well as evidence of bilateral lower extremity DVT and pelvic veins being compressed by pelvic adenopathy.  He had right peroneal DVT and acute left femoral DVT extending through the veins of the left lower leg and vascular surgery was consulted at that time and recommended no intervention discharged on anticoagulation with Xarelto.  He is currently on amoxicillin and Trulance without relief.  He was given fleets enema without relief in the ED.  He was also noted to have a hemoglobin of 6.6 down from 7.5 a few days ago.  He was transfused 1 unit PRBCs and general surgery as well as gastroenterology were consulted and they recommended bowel regimen and no surgical intervention unless the patient did not improve however subsequently after his enema today the patien likely perforated secondary to history of colon ulcer and had rigidity and guarding consistent with peritonitis.  General surgery is taking him to the OR for emergent surgery for a laparotomy, partial colectomy and colostomy and he is postoperative day 1 for exploratory laparotomy, sigmoid  colectomy and end colostomy with Dr. Yancey Flemings on 11/28/2020.  Findings intraoperatively showed a large perforation with diffuse feculent peritonitis and extensive contamination of the peritoneal cavity with stool and pus  General surgery feels that he is at very high risk for abscess formation and they are recommending following his labs closely and consider repeating a scan in next few days.  They are recommending continuing NG tube to low intermittent suction and await viable bowel function.  He does have some colostomy output.  Wound care has been consulted and he has been getting wet-to-dry dressings twice daily and pain control with IV acetaminophen.  His electrolytes and labs are been off so we will stop his potassium supplementation and change his fluid to D5W and give him a dose of Lokelma  Today his blood count dropped a little bit and he started having some blood from his nephrostomy in his colostomy so we will hold his heparin drip.  Surgery recommending continuing NG tube to low intermittent suction.  Continued to hold heparin drip given that his hemoglobin continues to drop and now that he has some blood in his nephrostomy in his colostomy.  However his hemoglobin has improved and his nephrostomy tubes had minimal blood in him so heparin drip was resumed.  He does have some blood-tinged urine now on his nephrostomies. Medical oncology evaluated and feel that his overall prognosis from process cancer General guarded with limited life expectancy.  Oncology feels that if he is able to obtain meaningful recovery that they will resume chemotherapy however the odds of this is happening is very small.  He pulled out his NG tube again and  it was not replaced. General Surgery feels that he is still at risk for intra-abdominal abscesses given his intraoperative findings and they are waiting for improvement in his distention before giving liquids or food from a p.o. standpoint.   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 Windle Guard on 11/28/2020 laparotomies/ -Patient presents with a 3-day history of fecal impaction secondary to prostate cancer along with his home Dilaudid -He has tried senna and MiraLAX in the past with out relief -Patient takes Movantik and flecainide and he was given Colace and senna trial of tapwater enemas as well as lactulose enemas -GI was consulted and recommended enemas the way he takes them -He subsequently became extremely hypotensive and had some guarding and rigidity of his abdomen and became diaphoretic -Stat KUB showed possible free air -General surgery was reconsulted and took the patient to the OR emergently -He underwent exploratory laparotomy, sigmoid colectomy and end colostomy. -Wound care has been consulted -Patient's NG tube has been removed and will hold off for now given that he continues to put out nightly -Patient's surgical pathology is back and it showed metastatic prostate cancer in the nodes -General surgery thinks his ostomy is tenuous but is working they are recommending continue IV antibiotics with IV Zosyn and JP drain for now -Palliative care has been consulted for goals of care discussion and appreciate their evaluation. Patient still wants full scope of care.  Metastatic prostate cancer with subsequent pain -Continue with Tylenol, hydrocodone and Dilaudid -General surgery started him on IV acetaminophen -Medical oncology was consulted and they are recommending that if he  has any meaningful recovery that they can resume chemotherapy but they feel this is small and they feel that his prognosis is guarded  Right Leg DVT -Currently on heparin infusion -Restart on Xarelto when cleared by general surgery  AKI Metabolic Acidosis -Creatinine peaked at 1.3, now back down to 0.7 -He has a nongap metabolic acidosis which is mild and likely related to hyperchloremia -He has been receiving hypotonic fluids  Hypokalemia -Replace  Hypophosphatemia -Replace  Acute on chronic anemia likely of chronic disease, trending down further -Hemoglobin had initially declined to 6.6 and he was transfused 2 units PRBC. -Follow-up hemoglobin had improved -Continue to monitor and transfuse for hemoglobin less than 7.  Hematuria -In the setting of being on anticoagulation -Appears to have resolved when anticoagulation was transiently held -Seen by urology with no further recommendations -Continue to monitor  Leukocytosis -WBC count currently trending up to 31.7 -CT abdomen repeated yesterday without any new findings -There was mention of evolving fluid collections, but no clear abscesses yet -He is currently on Zosyn and has been afebrile -Blood cultures, culture from drain, culture from nephrostomy repeated  Anasarca -Likely related to low protein state in the setting of IV fluid hydration -Further IV fluids have been discontinued -He does have some pleural effusions and some ascites -We will start on albumin infusions and low-dose Lasix.   DVT prophylaxis: Anticoagulated with a heparin drip  Code Status: Full code Family Communication: Spoke with his brother Legrand Como  at bedside Disposition Plan: Pending further clinical improvement and clearance by specialists  Status is: Inpatient  Remains inpatient appropriate because:Unsafe d/c plan, IV treatments appropriate due to intensity of illness or inability to take PO, and Inpatient level of care appropriate due to  severity of illness  Dispo: The patient is from: Home              Anticipated d/c is to: SNF              Patient currently is not medically stable to d/c.   Difficult to place patient No  Consultants:  General surgery Gastroenterology Palliative care medicine Urology Medical Oncology  Procedures: Emergent surgery: Procedure performed: Exploratory laparotomy, sigmoid colectomy with end colostomy  Antimicrobials: Anti-infectives (From admission, onward)    Start     Dose/Rate Route Frequency Ordered Stop   11/28/20 2230  piperacillin-tazobactam (ZOSYN) IVPB 3.375 g        3.375 g 12.5 mL/hr over 240 Minutes Intravenous Every 8 hours 11/28/20 2121     11/28/20 2215  piperacillin-tazobactam (ZOSYN) IVPB 3.375 g  Status:  Discontinued       Note to Pharmacy: Given Intraoperative   3.375 g 100 mL/hr over 30 Minutes Intravenous Every 8 hours 11/28/20 2117 11/28/20 2121   11/28/20 1730  piperacillin-tazobactam (ZOSYN) IVPB 3.375 g       Note to Pharmacy: Given Intraoperative   3.375 g 100 mL/hr over 30 Minutes Intravenous  Once 11/28/20 1729 11/28/20 1750   11/28/20 1712  piperacillin-tazobactam (ZOSYN) 3.375 GM/50ML IVPB       Note to Pharmacy: Marchia Meiers   : cabinet override      11/28/20 1712 11/28/20 1735   11/28/20 1545  ceFAZolin (ANCEF) IVPB 2g/100 mL premix        2 g 200 mL/hr over 30 Minutes Intravenous  Once 11/28/20 1538 11/28/20 1630   11/28/20 1539  ceFAZolin (ANCEF) 2-4 GM/100ML-% IVPB       Note to Pharmacy: Minor, Anneita   : cabinet override      11/28/20 1539 11/28/20 1654   11/27/20 1700  cephALEXin (KEFLEX) capsule 500 mg  Status:  Discontinued        500 mg Oral 3 times daily 11/27/20 1640 11/28/20 2117        Subjective: Denies any shortness of breath.  Reports that he is continue to take pain medications for abdominal pain.  He is having bowel movements.  Objective: Vitals:   12/04/20 0500 12/04/20 2045 12/05/20 0452 12/05/20 1137  BP:   116/65 131/72 125/71  Pulse:  94 (!) 108 (!) 105  Resp:  18 18 20   Temp:  98.2 F (36.8 C) 97.6 F (36.4 C) 97.7 F (36.5 C)  TempSrc:  Oral Oral Oral  SpO2:  97% 98% 100%  Weight: 93 kg     Height:        Intake/Output Summary (Last 24 hours) at 12/05/2020 2012 Last data filed at 12/05/2020 1747 Gross per 24 hour  Intake --  Output 1300 ml  Net -1300 ml   Filed Weights   12/02/20 0256 12/03/20 0505 12/04/20 0500  Weight: 92.7 kg 90.4 kg 93 kg   Examination: Physical Exam:  General exam: Alert, awake, oriented x 3 Respiratory system: Clear to auscultation. Respiratory effort normal. Cardiovascular system:RRR. No murmurs, rubs, gallops. Gastrointestinal system: Abdomen is soft and diffusely tender to palpation, mildly distended. No organomegaly or masses felt. Normal bowel sounds heard.  Drain in right lower  quadrant with purulent appearing fluid.  Stoma necrotic but with some bleeding centrally in gas/stool in pouch Central nervous system:No focal neurological deficits. Extremities: 2+ bilateral lower extremity edema Skin: No rashes, lesions or ulcers Psychiatry: Judgement and insight appear normal. Mood & affect appropriate.    Data Reviewed: I have personally reviewed following labs and imaging studies  CBC: Recent Labs  Lab 11/29/20 0352 11/30/20 0346 12/01/20 0356 12/02/20 0353 12/03/20 0309 12/04/20 0327 12/05/20 0325  WBC 11.3*   < > 18.4* 21.0* 21.8* 26.6* 31.7*  NEUTROABS 10.3*  --   --   --   --  21.7*  --   HGB 11.1*   < > 8.5* 8.9* 8.6* 7.8* 7.6*  HCT 35.2*   < > 27.2* 28.3* 26.9* 25.0* 24.0*  MCV 93.4   < > 93.8 93.4 92.4 93.3 94.9  PLT 213   < > 181 181 258 272 378   < > = values in this interval not displayed.   Basic Metabolic Panel: Recent Labs  Lab 11/30/20 0346 11/30/20 1804 12/01/20 0356 12/02/20 0353 12/03/20 0842 12/04/20 0327 12/05/20 0325  NA 147*   < > 141 140 136 142 140  K 5.8*   < > 4.2 3.5 2.9* 3.8 3.8  CL 120*   < > 116*  113* 108 115* 114*  CO2 19*   < > 19* 23 19* 19* 19*  GLUCOSE 167*   < > 82 73 85 116* 78  BUN 28*   < > 28* 27* 22 14 13   CREATININE 1.37*   < > 1.16 1.00 0.82 0.81 0.77  CALCIUM 8.0*   < > 7.9* 7.9* 7.4* 7.6* 7.7*  MG 2.2  --  2.1 2.1 2.0 2.0  --   PHOS 3.9  --  3.0 2.9 2.4* 2.3* 2.2*   < > = values in this interval not displayed.   GFR: Estimated Creatinine Clearance: 95 mL/min (by C-G formula based on SCr of 0.77 mg/dL). Liver Function Tests: Recent Labs  Lab 11/30/20 1804 12/01/20 0356 12/02/20 0353 12/03/20 0842 12/04/20 0327 12/05/20 0325  AST 26 28 41 36 33  --   ALT 10 9 15 18 14   --   ALKPHOS 85 87 116 119 119  --   BILITOT 0.5 0.5 0.9 1.0 0.9  --   PROT 5.1* 4.9* 5.1* 4.8* 5.1*  --   ALBUMIN 1.9* 1.7* 1.7* 1.5* 1.4* 1.3*   No results for input(s): LIPASE, AMYLASE in the last 168 hours. No results for input(s): AMMONIA in the last 168 hours. Coagulation Profile: No results for input(s): INR, PROTIME in the last 168 hours. Cardiac Enzymes: No results for input(s): CKTOTAL, CKMB, CKMBINDEX, TROPONINI in the last 168 hours. BNP (last 3 results) No results for input(s): PROBNP in the last 8760 hours. HbA1C: No results for input(s): HGBA1C in the last 72 hours. CBG: No results for input(s): GLUCAP in the last 168 hours. Lipid Profile: No results for input(s): CHOL, HDL, LDLCALC, TRIG, CHOLHDL, LDLDIRECT in the last 72 hours. Thyroid Function Tests: No results for input(s): TSH, T4TOTAL, FREET4, T3FREE, THYROIDAB in the last 72 hours. Anemia Panel: No results for input(s): VITAMINB12, FOLATE, FERRITIN, TIBC, IRON, RETICCTPCT in the last 72 hours. Sepsis Labs: No results for input(s): PROCALCITON, LATICACIDVEN in the last 168 hours.  Recent Results (from the past 240 hour(s))  Resp Panel by RT-PCR (Flu A&B, Covid) Nasopharyngeal Swab     Status: None   Collection Time: 11/27/20  1:51  PM   Specimen: Nasopharyngeal Swab; Nasopharyngeal(NP) swabs in vial transport  medium  Result Value Ref Range Status   SARS Coronavirus 2 by RT PCR NEGATIVE NEGATIVE Final    Comment: (NOTE) SARS-CoV-2 target nucleic acids are NOT DETECTED.  The SARS-CoV-2 RNA is generally detectable in upper respiratory specimens during the acute phase of infection. The lowest concentration of SARS-CoV-2 viral copies this assay can detect is 138 copies/mL. A negative result does not preclude SARS-Cov-2 infection and should not be used as the sole basis for treatment or other patient management decisions. A negative result may occur with  improper specimen collection/handling, submission of specimen other than nasopharyngeal swab, presence of viral mutation(s) within the areas targeted by this assay, and inadequate number of viral copies(<138 copies/mL). A negative result must be combined with clinical observations, patient history, and epidemiological information. The expected result is Negative.  Fact Sheet for Patients:  EntrepreneurPulse.com.au  Fact Sheet for Healthcare Providers:  IncredibleEmployment.be  This test is no t yet approved or cleared by the [place] FDA and  has been authorized for detection and/or diagnosis of SARS-CoV-2 by FDA under an Emergency Use Authorization (EUA). This EUA will remain  in effect (meaning this test can be used) for the duration of the COVID-19 declaration under Section 564(b)(1) of the Act, 21 U.S.C.section 360bbb-3(b)(1), unless the authorization is terminated  or revoked sooner.       Influenza A by PCR NEGATIVE NEGATIVE Final   Influenza B by PCR NEGATIVE NEGATIVE Final    Comment: (NOTE) The Xpert Xpress SARS-CoV-2/FLU/RSV plus assay is intended as an aid in the diagnosis of influenza from Nasopharyngeal swab specimens and should not be used as a sole basis for treatment. Nasal washings and aspirates are unacceptable for Xpert Xpress SARS-CoV-2/FLU/RSV testing.  Fact Sheet for  Patients: EntrepreneurPulse.com.au  Fact Sheet for Healthcare Providers: IncredibleEmployment.be  This test is not yet approved or cleared by the [place] FDA and has been authorized for detection and/or diagnosis of SARS-CoV-2 by FDA under an Emergency Use Authorization (EUA). This EUA will remain in effect (meaning this test can be used) for the duration of the COVID-19 declaration under Section 564(b)(1) of the Act, 21 U.S.C. section 360bbb-3(b)(1), unless the authorization is terminated or revoked.  Performed at [facility], [place] [address] [place], [place] [address]   Urine Culture     Status: None   Collection Time: 11/27/20  3:25 PM   Specimen: Kidney; Urine  Result Value Ref Range Status   Specimen Description   Final    KIDNEY LEFT NEPHROSTOMY Performed at [facility] [address], [place], [place] [address]    Special Requests   Final    NONE Performed at [facility], [place] [address] [place], [place] [address]    Culture   Final    NO GROWTH Performed at [facility], [place] [address] [place], [place] [address]    Report Status 11/29/2020 FINAL  Final  Urine Culture     Status: None   Collection Time: 11/27/20  3:25 PM   Specimen: Kidney; Urine  Result Value Ref Range Status   Specimen Description   Final    KIDNEY RIGHT NEPHROSTOMY Performed at [facility] [address], [place], [place] [address]    Special Requests   Final    NONE Performed at [facility], [place] [address] [place], [place] [address]    Culture  Final    NO GROWTH Performed at [facility], [place] [address], [place],  [address]    Report Status 11/29/2020 FINAL  Final      RN Pressure Injury Documentation:     Estimated body mass index is 31.17 kg/m as calculated from the following:   Height as of this encounter: 5\' 8"   (1.727 m).   Weight as of this encounter: 93 kg.  Malnutrition Type:  Nutrition Problem: Moderate Malnutrition Etiology: chronic illness, cancer and cancer related treatments  Malnutrition Characteristics:  Signs/Symptoms: mild fat depletion, mild muscle depletion, moderate muscle depletion  Nutrition Interventions:  Interventions: Prostat, Boost Breeze, MVI   Radiology Studies: CT ABDOMEN PELVIS W CONTRAST  Result Date: 12/04/2020 CLINICAL DATA:  Intra-abdominal abscess, history of stercoral ulceration and perforation of the sigmoid colon, status post Hartmann procedure sigmoid colon resection and colostomy, metastatic prostate cancer EXAM: CT ABDOMEN AND PELVIS WITH CONTRAST TECHNIQUE: Multidetector CT imaging of the abdomen and pelvis was performed using the standard protocol following bolus administration of intravenous contrast. CONTRAST:  69mL OMNIPAQUE IOHEXOL 350 MG/ML SOLN, additional oral enteric contrast COMPARISON:  11/27/2020 FINDINGS: Lower chest: Moderate, right greater than left pleural effusions associated atelectasis or consolidation. Hepatobiliary: No solid liver abnormality is seen. No gallstones, gallbladder wall thickening, or biliary dilatation. Pancreas: Unremarkable. No pancreatic ductal dilatation or surrounding inflammatory changes. Spleen: Normal in size without significant abnormality. Adrenals/Urinary Tract: Adrenal glands are unremarkable. Bilateral percutaneous nephrostomy tubes remain in position formed pigtails in the renal pelves. Bilateral double-J ureteral stent catheter remain in position with formed pigtails in the renal pelves and urinary bladder. Severely thickened urinary bladder. No hydronephrosis. Stomach/Bowel: Stomach is within normal limits. Appendix appears normal. No evidence of bowel wall thickening, distention, or inflammatory changes. Vascular/Lymphatic: Scattered aortic atherosclerosis. Multiple enlarged retroperitoneal, bilateral iliac, and  pelvic sidewall lymph nodes, unchanged compared to prior examination. Reproductive: Gross prostatomegaly. Other: Status post interval midline laparotomy. Surgical drain about the low abdomen. There is an air and fluid collection about the surgical drain in the left lower quadrant, measuring approximately 5.2 x 3.1 cm (series 2, image 81). Anasarca, worsened compared to prior examination. Small volume ascites, increased compared to prior examination, loculated appearing areas fluid, particularly about the posterior liver (series 2, image 29) and spleen (series 2, image 26). Musculoskeletal: Multiple sclerotic osseous metastases, unchanged. IMPRESSION: 1. Status post interval midline laparotomy with sigmoid colon resection and left lower quadrant end colostomy. 2. Surgical drain about the low abdomen. There is an air and fluid collection about the surgical drain in the left lower quadrant, measuring approximately 5.2 x 3.1 cm, concerning for abscess. 3. Small volume ascites, increased compared to prior examination, with loculated appearing areas of fluid about the abdomen. 4. Bilateral percutaneous nephrostomy tubes and double-J ureteral stent catheter remain in position with formed pigtails. 5. Prostatomegaly with metastatic lymphadenopathy and osseous metastatic disease, unchanged compared to recent. 6. Anasarca, worsened compared to prior examination. 7. Moderate, right greater than left pleural effusions, worsened compared to prior examination. Aortic Atherosclerosis (ICD10-I70.0). Electronically Signed   By: Eddie Candle M.D.   On: 12/04/2020 13:28   DG CHEST PORT 1 VIEW  Result Date: 12/05/2020 CLINICAL DATA:  70 year old male history of pleural effusion. EXAM: PORTABLE CHEST 1 VIEW COMPARISON:  Chest x-ray 10/27/2020. FINDINGS: Right internal jugular single-lumen power porta cath with tip terminating at the superior cavoatrial junction. Lung volumes are low. There are bibasilar opacities which may reflect  areas of atelectasis and/or consolidation,  likely with superimposed small bilateral pleural effusions. No pneumothorax. No evidence of pulmonary edema. Heart size is normal. Upper mediastinal contours are within normal limits. IMPRESSION: 1. Low lung volumes with bibasilar opacities which may reflect areas of atelectasis and/or consolidation, likely with small bilateral pleural effusions. Electronically Signed   By: Vinnie Langton M.D.   On: 12/05/2020 15:03    Scheduled Meds:  (feeding supplement) PROSource Plus  30 mL Oral BID BM   acetaminophen (TYLENOL) oral liquid 160 mg/5 mL  650 mg Oral Q6H   vitamin C  1,000 mg Oral Daily   chlorhexidine  15 mL Mouth Rinse BID   Chlorhexidine Gluconate Cloth  6 each Topical Daily   feeding supplement  1 Container Oral Q24H   furosemide  20 mg Intravenous BID   gabapentin  200 mg Oral BID   mouth rinse  15 mL Mouth Rinse q12n4p   methocarbamol  750 mg Oral TID   mirtazapine  7.5 mg Oral QHS   multivitamin with minerals  1 tablet Oral Daily   predniSONE  5 mg Oral Q breakfast   Continuous Infusions:  albumin human 25 g (12/05/20 1402)   heparin 1,800 Units/hr (12/05/20 1415)   piperacillin-tazobactam (ZOSYN)  IV 3.375 g (12/05/20 1346)    LOS: 8 days   Kathie Dike, MD [facility] PAGER is on [facility]  If 7PM-7AM, please contact night-coverage [email or web address]

## 2020-12-05 NOTE — Progress Notes (Signed)
ANTICOAGULATION CONSULT NOTE   Pharmacy Consult for heparin Indication: recent bilateral DVT on 8/17 (home xarelto on hold)  Allergies  Allergen Reactions   Ibuprofen Anaphylaxis   Shrimp [Shellfish Allergy] Anaphylaxis   Patient Measurements: wt 83kg, height 68 inches Height: 5\' 8"  (172.7 cm) Weight: 93 kg (205 lb 0.4 oz) IBW/kg (Calculated) : 68.4 Heparin Dosing Weight: 83 kg  Vital Signs: Temp: 97.7 F (36.5 C) (09/01 1137) Temp Source: Oral (09/01 1137) BP: 125/71 (09/01 1137) Pulse Rate: 105 (09/01 1137)  Labs: Recent Labs    12/03/20 0309 12/03/20 0842 12/03/20 1048 12/03/20 1950 12/04/20 0327 12/05/20 0325  HGB 8.6*  --   --   --  7.8* 7.6*  HCT 26.9*  --   --   --  25.0* 24.0*  PLT 258  --   --   --  272 378  HEPARINUNFRC 0.20*  --  0.44 0.44 0.60  --   CREATININE  --  0.82  --   --  0.81 0.77   Estimated Creatinine Clearance: 95 mL/min (by C-G formula based on SCr of 0.77 mg/dL).  Medications:  - on xarelto dose pack PTA (on 15mg  bid - last dose taken on 11/26/20 at [facility])  Assessment: Patient is a 71 y.o M with metastatic prostate cancer currently undergoing chemotherapy treatment and on xarelto PTA for bilateral DVTs that was diagnosed on 11/20/20, presented to the ED on 11/27/20 with c/o abdominal pain. Surgery and GI consulted for fecal impaction. Pharmacy has been consulted to transition patient to heparin drip in case invasive intervention is needed with this admission. Baseline aPTT 34 sec, heparin level is elevated at 0.91--> is elevated d/t effect of xarelto  Patient taken to OR on 8/25 for sigmoid colectomy and end colostomy placement due to concern for large bowel perforation in setting of fecal impaction. Heparin was held and resumed 8/26 @ 0200.  8/28 am Heparin held for bleeding noted from nephrostomy tubes and colostomy 8/31 Heparin held at 0514 am - new midline bleed,   Today, 12/05/2020: OK to resume Heparin per CCS, no bolus Last Heparin level  0.6 on 2000 units/hr Hemoglobin steadily downtrending (11.1 >> 8.9 >> 7.6), Platelets stable.   Goal of Therapy:  Heparin level 0.3-0.7 units/ml aPTT 66-102 seconds Monitor platelets by anticoagulation protocol: Yes   Plan:  Resume Heparin infusion at 1800 units/hr Check 6 hr Heparin level, must request IV team consult to draw sample from line Daily heparin level at steady state, resume daily CBC Monitor for s/sx bleeding  F/u ability to transition back to Laurita Quint PharmD [facility] Rx [id number] 12/05/2020, 12:44 PM

## 2020-12-05 NOTE — Progress Notes (Signed)
Progress Note  7 Days Post-Op  Subjective: Patient reports some abdominal pain this AM. Reports he was tolerating some liquids yesterday. Having bowel function. Brother at bedside this AM.   Objective: Vital signs in last 24 hours: Temp:  [97.6 F (36.4 C)-98.2 F (36.8 C)] 97.6 F (36.4 C) (09/01 0452) Pulse Rate:  [94-108] 108 (09/01 0452) Resp:  [18] 18 (09/01 0452) BP: (116-131)/(65-72) 131/72 (09/01 0452) SpO2:  [97 %-98 %] 98 % (09/01 0452) Last BM Date: 12/02/20  Intake/Output from previous day: 08/31 0701 - 09/01 0700 In: -  Out: 1210 [Urine:1100; Drains:10; Stool:100] Intake/Output this shift: No intake/output data recorded.  PE: General: pleasant, WD, chronically ill appearing male who is laying in bed in NAD Heart: regular, rate, and rhythm.   Lungs: CTAB, no wheezes, rhonchi, or rales noted.  Respiratory effort nonlabored Abd: soft, appropriately ttp, mildly distended, midline wound pale but without purulence, drain in RLQ with purulent appearing fluid, stoma necrotic but with some bleeding centrally and gas/stool in pouch   Lab Results:  Recent Labs    12/04/20 0327 12/05/20 0325  WBC 26.6* 31.7*  HGB 7.8* 7.6*  HCT 25.0* 24.0*  PLT 272 378   BMET Recent Labs    12/04/20 0327 12/05/20 0325  NA 142 140  K 3.8 3.8  CL 115* 114*  CO2 19* 19*  GLUCOSE 116* 78  BUN 14 13  CREATININE 0.81 0.77  CALCIUM 7.6* 7.7*   PT/INR No results for input(s): LABPROT, INR in the last 72 hours. CMP     Component Value Date/Time   NA 140 12/05/2020 0325   NA 143 11/25/2020 1554   K 3.8 12/05/2020 0325   CL 114 (H) 12/05/2020 0325   CO2 19 (L) 12/05/2020 0325   GLUCOSE 78 12/05/2020 0325   BUN 13 12/05/2020 0325   BUN 23 11/25/2020 1554   CREATININE 0.77 12/05/2020 0325   CREATININE 1.00 11/14/2020 1146   CALCIUM 7.7 (L) 12/05/2020 0325   PROT 5.1 (L) 12/04/2020 0327   ALBUMIN 1.3 (L) 12/05/2020 0325   AST 33 12/04/2020 0327   AST 35 11/14/2020  1146   ALT 14 12/04/2020 0327   ALT 45 (H) 11/14/2020 1146   ALKPHOS 119 12/04/2020 0327   BILITOT 0.9 12/04/2020 0327   BILITOT 0.3 11/14/2020 1146   GFRNONAA >60 12/05/2020 0325   GFRNONAA >60 11/14/2020 1146   GFRAA >60 11/29/2019 1306   Lipase     Component Value Date/Time   LIPASE 22 11/27/2020 1035       Studies/Results: CT ABDOMEN PELVIS W CONTRAST  Result Date: 12/04/2020 CLINICAL DATA:  Intra-abdominal abscess, history of stercoral ulceration and perforation of the sigmoid colon, status post Hartmann procedure sigmoid colon resection and colostomy, metastatic prostate cancer EXAM: CT ABDOMEN AND PELVIS WITH CONTRAST TECHNIQUE: Multidetector CT imaging of the abdomen and pelvis was performed using the standard protocol following bolus administration of intravenous contrast. CONTRAST:  68mL OMNIPAQUE IOHEXOL 350 MG/ML SOLN, additional oral enteric contrast COMPARISON:  11/27/2020 FINDINGS: Lower chest: Moderate, right greater than left pleural effusions associated atelectasis or consolidation. Hepatobiliary: No solid liver abnormality is seen. No gallstones, gallbladder wall thickening, or biliary dilatation. Pancreas: Unremarkable. No pancreatic ductal dilatation or surrounding inflammatory changes. Spleen: Normal in size without significant abnormality. Adrenals/Urinary Tract: Adrenal glands are unremarkable. Bilateral percutaneous nephrostomy tubes remain in position formed pigtails in the renal pelves. Bilateral double-J ureteral stent catheter remain in position with formed pigtails in the renal pelves and  urinary bladder. Severely thickened urinary bladder. No hydronephrosis. Stomach/Bowel: Stomach is within normal limits. Appendix appears normal. No evidence of bowel wall thickening, distention, or inflammatory changes. Vascular/Lymphatic: Scattered aortic atherosclerosis. Multiple enlarged retroperitoneal, bilateral iliac, and pelvic sidewall lymph nodes, unchanged compared to  prior examination. Reproductive: Gross prostatomegaly. Other: Status post interval midline laparotomy. Surgical drain about the low abdomen. There is an air and fluid collection about the surgical drain in the left lower quadrant, measuring approximately 5.2 x 3.1 cm (series 2, image 81). Anasarca, worsened compared to prior examination. Small volume ascites, increased compared to prior examination, loculated appearing areas fluid, particularly about the posterior liver (series 2, image 29) and spleen (series 2, image 26). Musculoskeletal: Multiple sclerotic osseous metastases, unchanged. IMPRESSION: 1. Status post interval midline laparotomy with sigmoid colon resection and left lower quadrant end colostomy. 2. Surgical drain about the low abdomen. There is an air and fluid collection about the surgical drain in the left lower quadrant, measuring approximately 5.2 x 3.1 cm, concerning for abscess. 3. Small volume ascites, increased compared to prior examination, with loculated appearing areas of fluid about the abdomen. 4. Bilateral percutaneous nephrostomy tubes and double-J ureteral stent catheter remain in position with formed pigtails. 5. Prostatomegaly with metastatic lymphadenopathy and osseous metastatic disease, unchanged compared to recent. 6. Anasarca, worsened compared to prior examination. 7. Moderate, right greater than left pleural effusions, worsened compared to prior examination. Aortic Atherosclerosis (ICD10-I70.0). Electronically Signed   By: Eddie Candle M.D.   On: 12/04/2020 13:28    Anti-infectives: Anti-infectives (From admission, onward)    Start     Dose/Rate Route Frequency Ordered Stop   11/28/20 2230  piperacillin-tazobactam (ZOSYN) IVPB 3.375 g        3.375 g 12.5 mL/hr over 240 Minutes Intravenous Every 8 hours 11/28/20 2121     11/28/20 2215  piperacillin-tazobactam (ZOSYN) IVPB 3.375 g  Status:  Discontinued       Note to Pharmacy: Given Intraoperative   3.375 g 100 mL/hr  over 30 Minutes Intravenous Every 8 hours 11/28/20 2117 11/28/20 2121   11/28/20 1730  piperacillin-tazobactam (ZOSYN) IVPB 3.375 g       Note to Pharmacy: Given Intraoperative   3.375 g 100 mL/hr over 30 Minutes Intravenous  Once 11/28/20 1729 11/28/20 1750   11/28/20 1712  piperacillin-tazobactam (ZOSYN) 3.375 GM/50ML IVPB       Note to Pharmacy: Marchia Meiers   : cabinet override      11/28/20 1712 11/28/20 1735   11/28/20 1545  ceFAZolin (ANCEF) IVPB 2g/100 mL premix        2 g 200 mL/hr over 30 Minutes Intravenous  Once 11/28/20 1538 11/28/20 1630   11/28/20 1539  ceFAZolin (ANCEF) 2-4 GM/100ML-% IVPB       Note to Pharmacy: Minor, Anneita   : cabinet override      11/28/20 1539 11/28/20 1654   11/27/20 1700  cephALEXin (KEFLEX) capsule 500 mg  Status:  Discontinued        500 mg Oral 3 times daily 11/27/20 1640 11/28/20 2117        Assessment/Plan Fecal impaction/Severe acute on chronic constipation POD7 s/p ex lap, sigmoid colectomy, end colostomy Dr. Kae Heller 8/25 Findings of large perforation with diffuse feculent peritonitis and extensive contamination of the peritoneal cavity with stool and pus - CT yesterday shows pelvic collection with surgical drain in good position, drainage is purulent appearing - continue drain and IV abx - WBC 30 and CT findings as above,  pt also on steroids which may be contributing to this  - tolerating sips of clears - ok to advance to FLD - encourage PO pain meds over IV - WOC consulted, stoma is necrotic but appears to be viable above the fascia  - Twice daily wet-to-dry dressing changes to midline wound - incentive spirometry, PT/OT - mobilize as able - surgical pathology confirms metastatic prostate cancer with positive LN   FEN: FLD, SLIV ID: [email or web address]. Zosyn 8/25>> VTE: heparin gtt   Metastatic prostate cancer ABL anemia on anemia of chronic disease - hgb 8.9, stable DVT, b/l LE   LOS: 8 days    Norm Parcel, [facility] Surgery 12/05/2020, 9:05 AM Please see Amion for pager number during day hours 7:00am-4:30pm

## 2020-12-05 NOTE — NC FL2 (Signed)
[place] LEVEL OF CARE SCREENING TOOL     IDENTIFICATION  Patient Name: Andre Glass Birthdate: [birth year] Sex: male Admission Date (Current Location): 11/27/2020  [facility] and [place] Number:  Herbalist and Address:  [facility],  [address] [place], [place]      Provider Number: [id number]  Attending Physician Name and Address:  Kathie Dike, MD  Relative Name and Phone Number:  Akin Yi ([phone]    Current Level of Care: Hospital Recommended Level of Care: [place] Prior Approval Number:    Date Approved/Denied:   PASRR Number: [id number]  Discharge Plan: SNF    Current Diagnoses: Patient Active Problem List   Diagnosis Date Noted   Fecal impaction ([place]) 11/27/2020   Hypokalemia 11/27/2020   Hypomagnesemia 11/27/2020   DVT of deep femoral vein, left ([place]) 11/27/2020   Abdominal pain    Severe protein-calorie malnutrition ([place]) 10/21/2020   Acute cystitis without hematuria    Anemia    Bacteremia due to Klebsiella pneumoniae    Altered mental status 10/18/2020   Myoclonus    Symptomatic anemia 10/17/2020   AKI (acute kidney injury) ([place]) 09/28/2020   Hydronephrosis 09/28/2020   Obstructive uropathy 09/28/2020   Intractable pain 01/18/2020   BPH (benign prostatic hyperplasia) 11/08/2019   Catheter-associated urinary tract infection ([place]) 09/14/2019   Drug induced constipation 07/18/2019   Goals of care, counseling/discussion 06/26/2019   Metastatic castration-resistant adenocarcinoma of prostate ([place]) 04/19/2019    Orientation RESPIRATION BLADDER Height & Weight     Self, Time, Situation, Place  Normal Incontinent Weight: 93 kg Height:  5\' 8"  (172.7 cm)  BEHAVIORAL SYMPTOMS/MOOD NEUROLOGICAL BOWEL NUTRITION STATUS      Colostomy Diet (Full liq)  AMBULATORY STATUS COMMUNICATION OF NEEDS Skin   Limited Assist Verbally Surgical wounds (R PCN;R abdominal drain;around stoma  necrotic wound.)                       Personal Care Assistance Level of Assistance  Bathing, Feeding, Dressing Bathing Assistance: Limited assistance Feeding assistance: Limited assistance Dressing Assistance: Limited assistance     Functional Limitations Info  Sight, Hearing, Speech Sight Info: Adequate   Speech Info: Impaired (dysphagia-fulls)    SPECIAL CARE FACTORS FREQUENCY  PT (By licensed PT), OT (By licensed OT)     PT Frequency:  (5x week) OT Frequency:  (5x week)            Contractures Contractures Info: Not present    Additional Factors Info  Code Status, Allergies Code Status Info:  (Full) Allergies Info:  (Ibuprofen;Shrimp;Shellfish allergy)           Current Medications (12/05/2020):  This is the current hospital active medication list Current Facility-Administered Medications  Medication Dose Route Frequency Provider Last Rate Last Admin   (feeding supplement) PROSource Plus liquid 30 mL  30 mL Oral BID BM Kathie Dike, MD       acetaminophen (TYLENOL) 160 MG/5ML solution 650 mg  650 mg Oral Q6H Barkley Boards R, PA-C   650 mg at 12/05/20 1118   albumin human 25 % solution 25 g  25 g Intravenous Q6H Kathie Dike, MD 60 mL/hr at 12/05/20 1402 25 g at 12/05/20 1402   ascorbic acid (VITAMIN C) tablet 1,000 mg  1,000 mg Oral Daily Romana Juniper A, MD   1,000 mg at 12/05/20 0945   chlorhexidine (PERIDEX) 0.12 % solution 15 mL  15 mL  Mouth Rinse BID Romana Juniper A, MD   15 mL at 12/05/20 0945   Chlorhexidine Gluconate Cloth 2 % PADS 6 each  6 each Topical Daily Clovis Riley, MD   6 each at 12/05/20 [id number]   feeding supplement (BOOST / RESOURCE BREEZE) liquid 1 Container  1 Container Oral Q24H Kathie Dike, MD       furosemide (LASIX) injection 20 mg  20 mg Intravenous BID Kathie Dike, MD       gabapentin (NEURONTIN) 250 MG/5ML solution 200 mg  200 mg Oral BID Kathie Dike, MD   200 mg at 12/05/20 1117   heparin ADULT infusion 100  units/mL (25000 units/253mL)  1,800 Units/hr Intravenous Continuous Minda Ditto, RPH 18 mL/hr at 12/05/20 1415 1,800 Units/hr at 12/05/20 1415   HYDROmorphone (DILAUDID) injection 0.5-1 mg  0.5-1 mg Intravenous Q1H PRN Romana Juniper A, MD   1 mg at 12/05/20 1206   MEDLINE mouth rinse  15 mL Mouth Rinse q12n4p Romana Juniper A, MD   15 mL at 12/05/20 1119   methocarbamol (ROBAXIN) tablet 750 mg  750 mg Oral TID Norm Parcel, PA-C   750 mg at 12/05/20 1027   mirtazapine (REMERON) tablet 7.5 mg  7.5 mg Oral QHS Romana Juniper A, MD   7.5 mg at 11/29/20 2127   multivitamin with minerals tablet 1 tablet  1 tablet Oral Daily Romana Juniper A, MD   1 tablet at 12/05/20 0945   ondansetron (ZOFRAN) tablet 4 mg  4 mg Oral Q6H PRN Clovis Riley, MD       Or   ondansetron [facility]) injection 4 mg  4 mg Intravenous Q6H PRN Romana Juniper A, MD   4 mg at 11/28/20 1655   oxyCODONE (ROXICODONE) 5 MG/5ML solution 5-10 mg  5-10 mg Oral Q4H PRN Barkley Boards R, PA-C   10 mg at 12/05/20 1345   piperacillin-tazobactam (ZOSYN) IVPB 3.375 g  3.375 g Intravenous Q8H Sheikh, Georgina Quint [place], DO 12.5 mL/hr at 12/05/20 1346 3.375 g at 12/05/20 1346   predniSONE (DELTASONE) tablet 5 mg  5 mg Oral Q breakfast Romana Juniper A, MD   5 mg at 12/05/20 0800   prochlorperazine (COMPAZINE) tablet 10 mg  10 mg Oral Q6H PRN Romana Juniper A, MD       sodium chloride flush (NS) 0.9 % injection 10-40 mL  10-40 mL Intracatheter PRN Romana Juniper A, MD   10 mL at 12/03/20 2536   tamsulosin (FLOMAX) capsule 0.4 mg  0.4 mg Oral Daily PRN Clovis Riley, MD       Facility-Administered Medications Ordered in Other Encounters  Medication Dose Route Frequency Provider Last Rate Last Admin   heparin lock flush 100 unit/mL  500 Units Intravenous Once Shadad, Firas N, MD       sodium chloride flush (NS) 0.9 % injection 10 mL  10 mL Intravenous PRN [name], MD         Discharge Medications: Please see discharge  summary for a list of discharge medications.  Relevant Imaging Results:  Relevant Lab Results:   Additional Information ss#[id number], [place], [place]

## 2020-12-05 NOTE — Progress Notes (Signed)
  Speech Language Pathology Treatment: Dysphagia  Andre Glass Details Name: Andre Glass MRN: [id number] DOB: 1951-02-02 Today's Date: 12/05/2020 Time: 1062-6948 SLP Time Calculation (min) (ACUTE ONLY): 25 min  Assessment / Plan / Recommendation Clinical Impression  Pt seen at bedside to assess tolerance of full liquid diet. Pt was sleepy, but sufficiently arousable for PO trials. Pt's brother and wife were present during this session. Pt was observed taking 2 small boluses of ensure via straw. No overt s/s aspiration, however, pt appeared to exhibit oral holding and required significant time to generate a second/dry swallow to clear oral cavity. Pt's brother indicated pt was taking minimal bites and sips at meals. Pt is clearly not meeting nutritional needs at this time, and continues to be profoundly weak. Safe swallow precautions updated at [facility]. SLP will continue to follow per plan of care.   HPI HPI: Andre Glass is a 70 y.o. male  with PMH: metastatic prostate cancer with obstructive uropathy requiring nephrostomy tube placement, recent CT with concern for malignant stricture and intra-abdominal metastatic disease, bilateral lower extremity DVT admitted on 11/27/2020 with abdominal pain with subsequent worsening and ex lap/ostomy performed urgently yesterday. Andre Glass has been cleared by surgery for clear liquids.      SLP Plan  Continue with current plan of care       Recommendations  Diet recommendations:  (full liquid) Liquids provided via: Cup;Straw Medication Administration: Other (Comment) (suspension) Supervision: Staff to assist with self feeding;Full supervision/cueing for compensatory strategies Compensations: Slow rate;Small sips/bites Postural Changes and/or Swallow Maneuvers: Seated upright 90 degrees;Upright 30-60 min after meal                Oral Care Recommendations: Oral care QID;Staff/trained caregiver to provide oral care Follow up Recommendations: Other (comment)  (TBD) SLP Visit Diagnosis: Dysphagia, unspecified (R13.10) Plan: Continue with current plan of care       [place]. Quentin Ore, [facility], [place] Speech Language Pathologist Office: [phone]  Shonna Chock 12/05/2020, 1:57 PM

## 2020-12-05 NOTE — Progress Notes (Signed)
Nutrition Follow-up  DOCUMENTATION CODES:   Non-severe (moderate) malnutrition in context of chronic illness  INTERVENTION:  - will order Boost Breeze one/day, each supplement provides 250 kcal and 9 grams of protein. -will order 30 ml Prosource Plus BID, each supplement provides 100 kcal and 15 grams protein.  - wife to bring in [place] from home.  - recommend small bore NGT placement and TF initiation if patient and family agreeable. - if TF to be initiated, recommend Osmolite 1.5 @ 20 ml/hr to advance by 10 ml every 12 hours to reach goal rate of 60 ml/hr with 45 ml Prosource TF TID and 100 ml free water every 4 hours.  - this regimen will provide 2280 kcal, 123 grams protein, and 1697 ml free water.    NUTRITION DIAGNOSIS:   Moderate Malnutrition related to chronic illness, cancer and cancer related treatments as evidenced by mild fat depletion, mild muscle depletion, moderate muscle depletion. -revised, ongoing   GOAL:   Patient will meet greater than or equal to 90% of their needs -unable to meet  MONITOR:   PO intake, Supplement acceptance, Diet advancement, Labs, Weight trends, Other (Comment) (placement of small bore NGT and TF initiation)   ASSESSMENT:   70 year old male with a history of metastatic prostate cancer, and obstructive uropathy requiring bilateral nephrostomy tubes, presented to ED from home with worsening right-sided abdominal pain.  He was ordered CLD from 8/24-8/25; NPO 8/25-8/29; sips of clears 8/29-this AM; advanced to FLD today at 0903.   SLP was able to evaluate patient shortly before RD visit. Please see her note for details on this.   Able to talk with patient's wife at bedside. Patient was mainly sleeping and did not verbalize at all during RD assessment. Patient has not wanted anything off of lunch tray following working with SLP.   Patient likes Premier Protein and was drinking this at home PTA. PTA he was ambulating without issue and  was able to feed self without issue; he is R handed.   Talked with patient's wife about placement of small bore NGT and patient's wife is very open to this but would like to talk with patient and patient's brother about this before proceeding.   Weight trending up since admission on 8/23. Patient has moderate edema to LLE. Wife reports chronic LLE edema.    Labs reviewed; Cl: 114 mmol/l, Ca: 7.7 mg/dl, Phos: 2.2 mg/dl. Medications reviewed; 1000 mg ascorbic acid/day, 20 mg IV lasix/day, 1 tablet multivitamin with minerals/day, 5 mg deltasone/day.     NUTRITION - FOCUSED PHYSICAL EXAM:  Flowsheet Row Most Recent Value  Orbital Region Moderate depletion  Upper Arm Region Mild depletion  Thoracic and Lumbar Region Unable to assess  Buccal Region Mild depletion  Temple Region Mild depletion  Clavicle Bone Region Moderate depletion  Clavicle and Acromion Bone Region Moderate depletion  Scapular Bone Region Unable to assess  Dorsal Hand Moderate depletion  Patellar Region Mild depletion  Anterior Thigh Region Moderate depletion  Posterior Calf Region Mild depletion  Edema (RD Assessment) Moderate  [LLE]  Hair Reviewed  Eyes Reviewed  Mouth Unable to assess  Skin Reviewed  Nails Reviewed       Diet Order:   Diet Order             Diet full liquid Room service appropriate? Yes; Fluid consistency: Thin  Diet effective now                   EDUCATION  NEEDS:   Education needs have been addressed  Skin:  Skin Assessment: Skin Integrity Issues: Skin Integrity Issues:: Incisions Incisions: abdomen (8/25)  Last BM:  9/1 (type 6 x2)  Height:   Ht Readings from Last 1 Encounters:  11/27/20 5\' 8"  (1.727 m)    Weight:   Wt Readings from Last 1 Encounters:  12/04/20 93 kg     Estimated Nutritional Needs:  Kcal:  2200-2400 kcal/d Protein:  110-125 g/d Fluid:  2.2-2.4 L/d     Jarome Matin, MS, RD, LDN, CNSC Inpatient Clinical Dietitian RD pager #  available in AMION  After hours/weekend pager # available in [facility]

## 2020-12-05 NOTE — TOC Progression Note (Signed)
Transition of Care [facility]) - Progression Note    Patient Details  Name: LORENCE NAGENGAST MRN: [id number] Date of Birth: Mar 13, 1951  [facility] CM/SW Contact  Jarek Longton, [place], RN Phone Number: 12/05/2020, 2:41 PM  Clinical Narrative:Patient/friend I rm/Michael(brother) agree to fax out for SNF-await bed offers.       Expected Discharge Plan: Skilled Nursing Facility Barriers to Discharge: Continued Medical Work up  Expected Discharge Plan and Services Expected Discharge Plan: [place]   Discharge Planning Services: CM Consult   Living arrangements for the past 2 months: Single Family Home                                       Social Determinants of Health (SDOH) Interventions    Readmission Risk Interventions Readmission Risk Prevention Plan 10/02/2020  Transportation Screening Complete  PCP or Specialist Appt within 5-7 Days Complete  Home Care Screening Complete  Medication Review (RN CM) Complete  Some recent data might be hidden

## 2020-12-05 NOTE — Progress Notes (Signed)
Pharmacy Brief Note - Evening Anticoagulation Follow Up:  Pt is a 50 yoM recently diagnosed with bilateral DVT. Pharmacy monitoring heparin drip while rivaroxaban on hold. For full history, see note by Graylin Shiver, PharmD from earlier today.   Assessment: HL = 0.24 is subtherapeutic on heparin infusion of 1800 units/hr Confirmed with RN that heparin infusing at correct rate. No interruptions or issues with line. No signed of bleeding reported.   Goal: HL 0.3 - 0.7  Plan: No bolus given recent bleeding during admission Increase heparin infusion to 2000 units/hr (previously therapeutic on this rate) Check 8 hour HL HL, CBC daily while on heparin infusion Monitor for signs/symptoms of bleeding.    Lenis Noon, PharmD 12/05/20 8:30 PM

## 2020-12-06 LAB — CBC WITH DIFFERENTIAL/PLATELET
Abs Immature Granulocytes: 0.3 10*3/uL — ABNORMAL HIGH (ref 0.00–0.07)
Band Neutrophils: 1 %
Basophils Absolute: 0 10*3/uL (ref 0.0–0.1)
Basophils Relative: 0 %
Eosinophils Absolute: 0 10*3/uL (ref 0.0–0.5)
Eosinophils Relative: 0 %
HCT: 18.7 % — ABNORMAL LOW (ref 39.0–52.0)
Hemoglobin: 5.8 g/dL — CL (ref 13.0–17.0)
Lymphocytes Relative: 1 %
Lymphs Abs: 0.3 10*3/uL — ABNORMAL LOW (ref 0.7–4.0)
MCH: 30.1 pg (ref 26.0–34.0)
MCHC: 31 g/dL (ref 30.0–36.0)
MCV: 96.9 fL (ref 80.0–100.0)
Monocytes Absolute: 0.3 10*3/uL (ref 0.1–1.0)
Monocytes Relative: 1 %
Myelocytes: 1 %
Neutro Abs: 31.3 10*3/uL — ABNORMAL HIGH (ref 1.7–7.7)
Neutrophils Relative %: 96 %
Platelets: 398 10*3/uL (ref 150–400)
RBC: 1.93 MIL/uL — ABNORMAL LOW (ref 4.22–5.81)
RDW: 19.9 % — ABNORMAL HIGH (ref 11.5–15.5)
WBC: 32.3 10*3/uL — ABNORMAL HIGH (ref 4.0–10.5)
nRBC: 0.3 % — ABNORMAL HIGH (ref 0.0–0.2)

## 2020-12-06 LAB — CBC
HCT: 22.7 % — ABNORMAL LOW (ref 39.0–52.0)
Hemoglobin: 7.2 g/dL — ABNORMAL LOW (ref 13.0–17.0)
MCH: 30 pg (ref 26.0–34.0)
MCHC: 31.7 g/dL (ref 30.0–36.0)
MCV: 94.6 fL (ref 80.0–100.0)
Platelets: 416 10*3/uL — ABNORMAL HIGH (ref 150–400)
RBC: 2.4 MIL/uL — ABNORMAL LOW (ref 4.22–5.81)
RDW: 19.5 % — ABNORMAL HIGH (ref 11.5–15.5)
WBC: 26.7 10*3/uL — ABNORMAL HIGH (ref 4.0–10.5)
nRBC: 0.7 % — ABNORMAL HIGH (ref 0.0–0.2)

## 2020-12-06 LAB — RENAL FUNCTION PANEL
Albumin: 2.3 g/dL — ABNORMAL LOW (ref 3.5–5.0)
Anion gap: 8 (ref 5–15)
BUN: 21 mg/dL (ref 8–23)
CO2: 18 mmol/L — ABNORMAL LOW (ref 22–32)
Calcium: 7.8 mg/dL — ABNORMAL LOW (ref 8.9–10.3)
Chloride: 113 mmol/L — ABNORMAL HIGH (ref 98–111)
Creatinine, Ser: 0.85 mg/dL (ref 0.61–1.24)
GFR, Estimated: >60 mL/min (ref >60)
Glucose, Bld: 98 mg/dL (ref 70–99)
Phosphorus: 3.4 mg/dL (ref 2.5–4.6)
Potassium: 3.3 mmol/L — ABNORMAL LOW (ref 3.5–5.1)
Sodium: 139 mmol/L (ref 135–145)

## 2020-12-06 LAB — HEPARIN LEVEL (UNFRACTIONATED)
Heparin Unfractionated: 0.43 IU/mL (ref 0.30–0.70)
Heparin Unfractionated: 0.38 IU/mL (ref 0.30–0.70)

## 2020-12-06 LAB — PREPARE RBC (CROSSMATCH)

## 2020-12-06 MED ORDER — POTASSIUM CHLORIDE 10 MEQ/100ML IV SOLN
10.0000 meq | INTRAVENOUS | Status: AC
  Administered 2020-12-06 (×4): 10 meq via INTRAVENOUS
  Filled 2020-12-06 (×3): qty 100

## 2020-12-06 MED ORDER — ALBUMIN HUMAN 25 % IV SOLN
25.0000 g | Freq: Two times a day (BID) | INTRAVENOUS | Status: AC
  Administered 2020-12-06: 25 g via INTRAVENOUS
  Filled 2020-12-06 (×2): qty 100

## 2020-12-06 MED ORDER — METHOCARBAMOL 500 MG PO TABS
1000.0000 mg | ORAL_TABLET | Freq: Three times a day (TID) | ORAL | Status: DC
  Administered 2020-12-06 – 2020-12-17 (×32): 1000 mg via ORAL
  Filled 2020-12-06 (×33): qty 2

## 2020-12-06 MED ORDER — SODIUM CHLORIDE 0.9% IV SOLUTION: Freq: Once | INTRAVENOUS | Status: AC

## 2020-12-06 MED ORDER — DAKINS (1/4 STRENGTH) 0.125 % EX SOLN
Freq: Two times a day (BID) | CUTANEOUS | Status: AC
  Administered 2020-12-06: 1
  Filled 2020-12-06: qty 473

## 2020-12-06 MED ORDER — ALUM & MAG HYDROXIDE-SIMETH 200-200-20 MG/5ML PO SUSP
15.0000 mL | Freq: Four times a day (QID) | ORAL | Status: DC | PRN
  Administered 2020-12-06 – 2020-12-07 (×2): 15 mL via ORAL
  Filled 2020-12-06 (×2): qty 30

## 2020-12-06 NOTE — Progress Notes (Addendum)
ANTICOAGULATION CONSULT NOTE   Pharmacy Consult for heparin Indication: recent bilateral DVT on 8/17 (home xarelto on hold)  Allergies  Allergen Reactions   Ibuprofen Anaphylaxis   Shrimp [Shellfish Allergy] Anaphylaxis   Patient Measurements:  Height: 5\' 8"  (172.7 cm) Weight: 93 kg (205 lb 0.4 oz) IBW/kg (Calculated) : 68.4 Heparin Dosing Weight: 87.8 kg  Vital Signs: Temp: 97.9 F (36.6 C) (09/02 0635) Temp Source: Oral (09/02 0635) BP: 138/68 (09/02 0635) Pulse Rate: 87 (09/02 0635)  Labs: Recent Labs    12/04/20 0327 12/05/20 0325 12/05/20 1956 12/06/20 0550  HGB 7.8* 7.6*  --   --   HCT 25.0* 24.0*  --   --   PLT 272 378  --   --   HEPARINUNFRC 0.60  --  0.24* 0.43  CREATININE 0.81 0.77  --   --    Estimated Creatinine Clearance: 95 mL/min (by C-G formula based on SCr of 0.77 mg/dL).  Medications:  - on xarelto dose pack PTA (on initial 15mg  BID regimen - last dose taken on 11/26/20 at 1800)  Assessment: Patient is a 70 y.o M with metastatic prostate cancer currently undergoing chemotherapy treatment and on xarelto PTA for bilateral DVTs that was diagnosed on 11/20/20, presented to the ED on 11/27/20 with c/o abdominal pain. Surgery and GI consulted for fecal impaction. Pharmacy has been consulted to transition patient to heparin drip in case invasive intervention is needed with this admission. Baseline aPTT 34 sec, heparin level is elevated at 0.91--> is elevated d/t effect of xarelto  Patient taken to OR on 8/25 for sigmoid colectomy and end colostomy placement due to concern for large bowel perforation in setting of fecal impaction. Heparin was held and resumed 8/26 @ 0200.  - 8/28 am Heparin held for bleeding noted from nephrostomy tubes and colostomy - 8/31 Heparin held at 0514 am for new midline bleed - 9/1 OK to resume Heparin per CCS, no bolus  Today, 12/06/2020: AM heparin level = 0.43 units/mL, therapeutic Hemoglobin steadily downtrending (11.1 >> 8.9 >>  7.6), Platelets stable. Awaiting CBC today No infusion issues noted per nursing   Goal of Therapy:  Heparin level 0.3-0.7 units/ml Monitor platelets by anticoagulation protocol: Yes   Plan:  Continue heparin infusion at 2000 units/hr Check heparin level at noon to ensure remains within therapeutic range. Must request IV team consult to draw sample from line.  Daily CBC, heparin level  Monitor closely for s/sx of bleeding  F/u ability to transition back to [place], PharmD, BCPS Clinical Pharmacist  12/06/2020, 9:34 AM   Addendum: Hgb today decreased to 5.8. Noted to have hematuria in nephrostomies. Heparin infusion stopped per MD. Transfusing 1 unit PRBCs. Have d/c'ed further heparin levels. MD to re-order anticoagulation when appropriate.    Lindell Spar, PharmD, BCPS Clinical Pharmacist 12/06/2020 12:04 PM

## 2020-12-06 NOTE — Progress Notes (Signed)
Progress Note  8 Days Post-Op  Subjective: Patient reports ~resolved abdominal pain. Reports he was tolerating liquids without n/v. Having bowel function.   Objective: Vital signs in last 24 hours: Temp:  [97.7 F (36.5 C)-98.6 F (37 C)] 97.9 F (36.6 C) (09/02 0635) Pulse Rate:  [87-105] 87 (09/02 0635) Resp:  [16-20] 18 (09/02 0635) BP: (125-138)/(62-71) 138/68 (09/02 0635) SpO2:  [95 %-100 %] 96 % (09/02 0635) Last BM Date: 12/06/20  Intake/Output from previous day: 09/01 0701 - 09/02 0700 In: 765.5 [I.V.:165.6; IV Piggyback:599.9] Out: 1490 [Urine:1050; Drains:40; Stool:400] Intake/Output this shift: No intake/output data recorded.  PE: General: pleasant, WD, chronically ill appearing male who is laying in bed in NAD Heart: regular, rate, and rhythm.   Lungs: CTAB, no wheezes, rhonchi, or rales noted.  Respiratory effort nonlabored Abd: soft, appropriately ttp, mildly distended, midline wound pale but without purulence, cyanogreen staining on dressing; drain in RLQ with purulent appearing fluid, stoma necrotic but with some bleeding centrally and viable above fascia; gas/stool in pouch   Lab Results:  Recent Labs    12/04/20 0327 12/05/20 0325  WBC 26.6* 31.7*  HGB 7.8* 7.6*  HCT 25.0* 24.0*  PLT 272 378   BMET Recent Labs    12/04/20 0327 12/05/20 0325  NA 142 140  K 3.8 3.8  CL 115* 114*  CO2 19* 19*  GLUCOSE 116* 78  BUN 14 13  CREATININE 0.81 0.77  CALCIUM 7.6* 7.7*   PT/INR No results for input(s): LABPROT, INR in the last 72 hours. CMP     Component Value Date/Time   NA 140 12/05/2020 0325   NA 143 11/25/2020 1554   K 3.8 12/05/2020 0325   CL 114 (H) 12/05/2020 0325   CO2 19 (L) 12/05/2020 0325   GLUCOSE 78 12/05/2020 0325   BUN 13 12/05/2020 0325   BUN 23 11/25/2020 1554   CREATININE 0.77 12/05/2020 0325   CREATININE 1.00 11/14/2020 1146   CALCIUM 7.7 (L) 12/05/2020 0325   PROT 5.1 (L) 12/04/2020 0327   ALBUMIN 1.3 (L)  12/05/2020 0325   AST 33 12/04/2020 0327   AST 35 11/14/2020 1146   ALT 14 12/04/2020 0327   ALT 45 (H) 11/14/2020 1146   ALKPHOS 119 12/04/2020 0327   BILITOT 0.9 12/04/2020 0327   BILITOT 0.3 11/14/2020 1146   GFRNONAA >60 12/05/2020 0325   GFRNONAA >60 11/14/2020 1146   GFRAA >60 11/29/2019 1306   Lipase     Component Value Date/Time   LIPASE 22 11/27/2020 1035       Studies/Results: CT ABDOMEN PELVIS W CONTRAST  Result Date: 12/04/2020 CLINICAL DATA:  Intra-abdominal abscess, history of stercoral ulceration and perforation of the sigmoid colon, status post Hartmann procedure sigmoid colon resection and colostomy, metastatic prostate cancer EXAM: CT ABDOMEN AND PELVIS WITH CONTRAST TECHNIQUE: Multidetector CT imaging of the abdomen and pelvis was performed using the standard protocol following bolus administration of intravenous contrast. CONTRAST:  4mL OMNIPAQUE IOHEXOL 350 MG/ML SOLN, additional oral enteric contrast COMPARISON:  11/27/2020 FINDINGS: Lower chest: Moderate, right greater than left pleural effusions associated atelectasis or consolidation. Hepatobiliary: No solid liver abnormality is seen. No gallstones, gallbladder wall thickening, or biliary dilatation. Pancreas: Unremarkable. No pancreatic ductal dilatation or surrounding inflammatory changes. Spleen: Normal in size without significant abnormality. Adrenals/Urinary Tract: Adrenal glands are unremarkable. Bilateral percutaneous nephrostomy tubes remain in position formed pigtails in the renal pelves. Bilateral double-J ureteral stent catheter remain in position with formed pigtails in the renal  pelves and urinary bladder. Severely thickened urinary bladder. No hydronephrosis. Stomach/Bowel: Stomach is within normal limits. Appendix appears normal. No evidence of bowel wall thickening, distention, or inflammatory changes. Vascular/Lymphatic: Scattered aortic atherosclerosis. Multiple enlarged retroperitoneal, bilateral  iliac, and pelvic sidewall lymph nodes, unchanged compared to prior examination. Reproductive: Gross prostatomegaly. Other: Status post interval midline laparotomy. Surgical drain about the low abdomen. There is an air and fluid collection about the surgical drain in the left lower quadrant, measuring approximately 5.2 x 3.1 cm (series 2, image 81). Anasarca, worsened compared to prior examination. Small volume ascites, increased compared to prior examination, loculated appearing areas fluid, particularly about the posterior liver (series 2, image 29) and spleen (series 2, image 26). Musculoskeletal: Multiple sclerotic osseous metastases, unchanged. IMPRESSION: 1. Status post interval midline laparotomy with sigmoid colon resection and left lower quadrant end colostomy. 2. Surgical drain about the low abdomen. There is an air and fluid collection about the surgical drain in the left lower quadrant, measuring approximately 5.2 x 3.1 cm, concerning for abscess. 3. Small volume ascites, increased compared to prior examination, with loculated appearing areas of fluid about the abdomen. 4. Bilateral percutaneous nephrostomy tubes and double-J ureteral stent catheter remain in position with formed pigtails. 5. Prostatomegaly with metastatic lymphadenopathy and osseous metastatic disease, unchanged compared to recent. 6. Anasarca, worsened compared to prior examination. 7. Moderate, right greater than left pleural effusions, worsened compared to prior examination. Aortic Atherosclerosis (ICD10-I70.0). Electronically Signed   By: Eddie Candle M.D.   On: 12/04/2020 13:28   DG CHEST PORT 1 VIEW  Result Date: 12/05/2020 CLINICAL DATA:  70 year old male history of pleural effusion. EXAM: PORTABLE CHEST 1 VIEW COMPARISON:  Chest x-ray 10/27/2020. FINDINGS: Right internal jugular single-lumen power porta cath with tip terminating at the superior cavoatrial junction. Lung volumes are low. There are bibasilar opacities which may  reflect areas of atelectasis and/or consolidation, likely with superimposed small bilateral pleural effusions. No pneumothorax. No evidence of pulmonary edema. Heart size is normal. Upper mediastinal contours are within normal limits. IMPRESSION: 1. Low lung volumes with bibasilar opacities which may reflect areas of atelectasis and/or consolidation, likely with small bilateral pleural effusions. Electronically Signed   By: Vinnie Langton M.D.   On: 12/05/2020 15:03    Anti-infectives: Anti-infectives (From admission, onward)    Start     Dose/Rate Route Frequency Ordered Stop   11/28/20 2230  piperacillin-tazobactam (ZOSYN) IVPB 3.375 g        3.375 g 12.5 mL/hr over 240 Minutes Intravenous Every 8 hours 11/28/20 2121     11/28/20 2215  piperacillin-tazobactam (ZOSYN) IVPB 3.375 g  Status:  Discontinued       Note to Pharmacy: Given Intraoperative   3.375 g 100 mL/hr over 30 Minutes Intravenous Every 8 hours 11/28/20 2117 11/28/20 2121   11/28/20 1730  piperacillin-tazobactam (ZOSYN) IVPB 3.375 g       Note to Pharmacy: Given Intraoperative   3.375 g 100 mL/hr over 30 Minutes Intravenous  Once 11/28/20 1729 11/28/20 1750   11/28/20 1712  piperacillin-tazobactam (ZOSYN) 3.375 GM/50ML IVPB       Note to Pharmacy: Marchia Meiers   : cabinet override      11/28/20 1712 11/28/20 1735   11/28/20 1545  ceFAZolin (ANCEF) IVPB 2g/100 mL premix        2 g 200 mL/hr over 30 Minutes Intravenous  Once 11/28/20 1538 11/28/20 1630   11/28/20 1539  ceFAZolin (ANCEF) 2-4 GM/100ML-% IVPB  Note to Pharmacy: Minor, Anneita   : cabinet override      11/28/20 1539 11/28/20 1654   11/27/20 1700  cephALEXin (KEFLEX) capsule 500 mg  Status:  Discontinued        500 mg Oral 3 times daily 11/27/20 1640 11/28/20 2117        Assessment/Plan Fecal impaction/Severe acute on chronic constipation POD7 s/p ex lap, sigmoid colectomy, end colostomy Dr. Kae Heller 8/25 Findings of large perforation with diffuse  feculent peritonitis and extensive contamination of the peritoneal cavity with stool and pus - CT yesterday shows pelvic collection with surgical drain in good position, drainage is purulent appearing - continue drain and IV abx - WBC 30 and CT findings as above, pt also on steroids which may be contributing to this  - tolerating sips of clears - ok to advance to FLD - encourage PO pain meds over IV - WOC consulted, stoma is necrotic but appears to be viable above the fascia  - Twice daily wet-to-dry dressing changes to midline wound - Added Dakins 9/2. - incentive spirometry, PT/OT - mobilize as able - surgical pathology confirms metastatic prostate cancer with positive LN   FEN: Soft diet, SLIV ID: [id number]. Zosyn 8/25>> VTE: heparin gtt   Metastatic prostate cancer ABL anemia on anemia of chronic disease - hgb 7.6 from 7.8, monitor DVT, b/l LE   LOS: 9 days    Ileana Roup, MD [facility] 12/06/2020, 9:50 AM Please see Amion for pager number during day hours 7:00am-4:30pm

## 2020-12-06 NOTE — Progress Notes (Signed)
PROGRESS NOTE    Andre Glass  LYY:[id number] DOB: Oct 12, 1950 DOA: 11/27/2020 PCP: [name], DO  Brief Narrative:  The patient is a 70 year old African-American male with a past medical history significant for but not limited to metastatic prostate cancer with resultant obstructive uropathy requiring bilateral nephrostomy tube placement with other comorbidities who presented with worsening right-sided abdominal pain that started on the morning of yesterday.  The pain was found to be in the right lower quadrant and was a sharp 10 out of 10 in severity.  Patient dates that he passed gas yesterday and had several loose bowel movements but states nothing made the pain worse or better.  He recently had a CT scan on 11/20/2020 which showed possible malignant stricture was progressive and intra-abdominal metastatic disease and extensive osseous metastatic disease as well as evidence of bilateral lower extremity DVT and pelvic veins being compressed by pelvic adenopathy.  He had right peroneal DVT and acute left femoral DVT extending through the veins of the left lower leg and vascular surgery was consulted at that time and recommended no intervention discharged on anticoagulation with Xarelto.  He is currently on amoxicillin and Trulance without relief.  He was given fleets enema without relief in the ED.  He was also noted to have a hemoglobin of 6.6 down from 7.5 a few days ago.  He was transfused 1 unit PRBCs and general surgery as well as gastroenterology were consulted and they recommended bowel regimen and no surgical intervention unless the patient did not improve however subsequently after his enema today the patien likely perforated secondary to history of colon ulcer and had rigidity and guarding consistent with peritonitis.  General surgery is taking him to the OR for emergent surgery for a laparotomy, partial colectomy and colostomy and he is postoperative day 1 for exploratory laparotomy, sigmoid  colectomy and end colostomy with Dr. Yancey Flemings on 11/28/2020.  Findings intraoperatively showed a large perforation with diffuse feculent peritonitis and extensive contamination of the peritoneal cavity with stool and pus  General surgery feels that he is at very high risk for abscess formation and they are recommending following his labs closely and consider repeating a scan in next few days.  They are recommending continuing NG tube to low intermittent suction and await viable bowel function.  He does have some colostomy output.  Wound care has been consulted and he has been getting wet-to-dry dressings twice daily and pain control with IV acetaminophen.  His electrolytes and labs are been off so we will stop his potassium supplementation and change his fluid to D5W and give him a dose of Lokelma  Today his blood count dropped a little bit and he started having some blood from his nephrostomy in his colostomy so we will hold his heparin drip.  Surgery recommending continuing NG tube to low intermittent suction.  Continued to hold heparin drip given that his hemoglobin continues to drop and now that he has some blood in his nephrostomy in his colostomy.  However his hemoglobin has improved and his nephrostomy tubes had minimal blood in him so heparin drip was resumed.  He does have some blood-tinged urine now on his nephrostomies. Medical oncology evaluated and feel that his overall prognosis from process cancer General guarded with limited life expectancy.  Oncology feels that if he is able to obtain meaningful recovery that they will resume chemotherapy however the odds of this is happening is very small.  He pulled out his NG tube again and  it was not replaced. General Surgery feels that he is still at risk for intra-abdominal abscesses given his intraoperative findings and they are waiting for improvement in his distention before giving liquids or food from a p.o. standpoint.   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 Windle Guard on 11/28/2020 laparotomies/ -Patient presents with a 3-day history of fecal impaction secondary to prostate cancer along with his home Dilaudid -He has tried senna and MiraLAX in the past with out relief -Patient takes Movantik and flecainide and he was given Colace and senna trial of tapwater enemas as well as lactulose enemas -GI was consulted and recommended enemas the way he takes them -He subsequently became extremely hypotensive and had some guarding and rigidity of his abdomen and became diaphoretic -Stat KUB showed possible free air -General surgery was reconsulted and took the patient to the OR emergently -He underwent exploratory laparotomy, sigmoid colectomy and end colostomy. -Wound care has been consulted -Patient's NG tube has been removed and will hold off for now given that he continues to put out nightly -Patient's surgical pathology is back and it showed metastatic prostate cancer in the nodes -General surgery thinks his ostomy is tenuous but is working they are recommending continue IV antibiotics with IV Zosyn and JP drain for now -Palliative care has been consulted for goals of care discussion and appreciate their evaluation. Patient still wants full scope of care.  Metastatic prostate cancer with subsequent pain -Continue with Tylenol, hydrocodone and Dilaudid -General surgery started him on IV acetaminophen -Medical oncology was consulted and they are recommending that if he  has any meaningful recovery that they can resume chemotherapy but they feel this is small and they feel that his prognosis is guarded  Right Leg DVT -Currently on heparin infusion, but held in light of anemia -We will restart when hematuria has resolved and hemoglobin is stable -Restart on Xarelto when cleared by general surgery  AKI Metabolic Acidosis -Creatinine peaked at 1.3, now back down to 0.7 -He has a nongap metabolic acidosis which is mild and likely related to hyperchloremia -He has been receiving hypotonic fluids  Hypokalemia -Replace  Hypophosphatemia -Replace  Acute on chronic anemia likely of chronic disease, trending down further -Hemoglobin had initially declined to 6.6 and he was transfused 2 units PRBC. -Follow-up hemoglobin had improved -Hemoglobin has trended back down to 5.8 today -He is being transfused 1 unit PRBC -Heparin has been discontinued since he has been having hematuria  Hematuria -Patient noted to have hematuria in nephrostomies -We will hold further heparin for now and monitor to see if this clears   Leukocytosis -WBC count currently trending up to 32.3 -CT abdomen repeated yesterday without any new findings -There was mention of evolving fluid collections, but no clear abscesses yet -He is currently on Zosyn and has been afebrile -Blood cultures, culture from drain, culture from nephrostomy repeated  Anasarca -Likely related to low protein state in the setting of IV fluid hydration -Further IV fluids have been discontinued -He does have some pleural effusions and some ascites -Started on  albumin infusions and low-dose Lasix with improvement of edema.   DVT prophylaxis: Anticoagulated with a heparin drip  Code Status: Full code Family Communication: Spoke with his brother Legrand Como at bedside Disposition Plan: Pending further clinical improvement and clearance by specialists  Status is: Inpatient  Remains inpatient appropriate  because:Unsafe d/c plan, IV treatments appropriate due to intensity of illness or inability to take PO, and Inpatient level of care appropriate due to severity of illness  Dispo: The patient is from: Home              Anticipated d/c is to: SNF              Patient currently is not medically stable to d/c.   Difficult to place patient No  Consultants:  General surgery Gastroenterology Palliative care medicine Urology Medical Oncology  Procedures: Emergent surgery: Procedure performed: Exploratory laparotomy, sigmoid colectomy with end colostomy  Antimicrobials: Anti-infectives (From admission, onward)    Start     Dose/Rate Route Frequency Ordered Stop   11/28/20 2230  piperacillin-tazobactam (ZOSYN) IVPB 3.375 g        3.375 g 12.5 mL/hr over 240 Minutes Intravenous Every 8 hours 11/28/20 2121     11/28/20 2215  piperacillin-tazobactam (ZOSYN) IVPB 3.375 g  Status:  Discontinued       Note to Pharmacy: Given Intraoperative   3.375 g 100 mL/hr over 30 Minutes Intravenous Every 8 hours 11/28/20 2117 11/28/20 2121   11/28/20 1730  piperacillin-tazobactam (ZOSYN) IVPB 3.375 g       Note to Pharmacy: Given Intraoperative   3.375 g 100 mL/hr over 30 Minutes Intravenous  Once 11/28/20 1729 11/28/20 1750   11/28/20 1712  piperacillin-tazobactam (ZOSYN) 3.375 GM/50ML IVPB       Note to Pharmacy: Marchia Meiers   : cabinet override      11/28/20 1712 11/28/20 1735   11/28/20 1545  ceFAZolin (ANCEF) IVPB 2g/100 mL premix        2 g 200 mL/hr over 30 Minutes Intravenous  Once 11/28/20 1538 11/28/20 1630   11/28/20 1539  ceFAZolin (ANCEF) 2-4 GM/100ML-% IVPB       Note to Pharmacy: Minor, Anneita   : cabinet override      11/28/20 1539 11/28/20 1654   11/27/20 1700  cephALEXin (KEFLEX) capsule 500 mg  Status:  Discontinued        500 mg Oral 3 times daily 11/27/20 1640 11/28/20 2117        Subjective: Denies any shortness of breath. Has intermittent abdominal pain.  No  vomiting  Objective: Vitals:   12/06/20 1431 12/06/20 1454 12/06/20 1806 12/06/20 1815  BP: 120/67 121/80 116/64 124/67  Pulse: 92 93 84 86  Resp: 18 20 18 17   Temp: 97.8 F (36.6 C) 98.1 F (36.7 C) 98.2 F (36.8 C) 97.8 F (36.6 C)  TempSrc: Oral Oral Oral Oral  SpO2: 99% 98% 98% 99%  Weight:      Height:        Intake/Output Summary (Last 24 hours) at 12/06/2020 1833 Last data filed at 12/06/2020 1800 Gross per 24 hour  Intake 1345.52 ml  Output 975 ml  Net 370.52 ml   Filed Weights   12/02/20 0256 12/03/20 0505 12/04/20 0500  Weight: 92.7 kg 90.4 kg 93 kg   Examination: Physical Exam:  General exam: Alert, awake, oriented x 3 Respiratory system: Clear to auscultation. Respiratory effort normal. Cardiovascular system:RRR. No murmurs, rubs, gallops. Gastrointestinal system: Abdomen is nondistended, soft and  nontender. No organomegaly or masses felt. Normal bowel sounds heard. Hematuria noted in nephrostomies Central nervous system: Alert and oriented. No focal neurological deficits. Extremities: 1+ edema bilaterally Skin: No rashes, lesions or ulcers Psychiatry: Judgement and insight appear normal. Mood & affect appropriate.     Data Reviewed: I have personally reviewed following labs and imaging studies  CBC: Recent Labs  Lab 12/02/20 0353 12/03/20 0309 12/04/20 0327 12/05/20 0325 12/06/20 1039  WBC 21.0* 21.8* 26.6* 31.7* 32.3*  NEUTROABS  --   --  21.7*  --  31.3*  HGB 8.9* 8.6* 7.8* 7.6* 5.8*  HCT 28.3* 26.9* 25.0* 24.0* 18.7*  MCV 93.4 92.4 93.3 94.9 96.9  PLT 181 258 272 378 220   Basic Metabolic Panel: Recent Labs  Lab 11/30/20 0346 11/30/20 1804 12/01/20 0356 12/02/20 0353 12/03/20 0842 12/04/20 0327 12/05/20 0325 12/06/20 1039  NA 147*   < > 141 140 136 142 140 139  K 5.8*   < > 4.2 3.5 2.9* 3.8 3.8 3.3*  CL 120*   < > 116* 113* 108 115* 114* 113*  CO2 19*   < > 19* 23 19* 19* 19* 18*  GLUCOSE 167*   < > 82 73 85 116* 78 98  BUN 28*    < > 28* 27* 22 14 13 21   CREATININE 1.37*   < > 1.16 1.00 0.82 0.81 0.77 0.85  CALCIUM 8.0*   < > 7.9* 7.9* 7.4* 7.6* 7.7* 7.8*  MG 2.2  --  2.1 2.1 2.0 2.0  --   --   PHOS 3.9  --  3.0 2.9 2.4* 2.3* 2.2* 3.4   < > = values in this interval not displayed.   GFR: Estimated Creatinine Clearance: 89.4 mL/min (by C-G formula based on SCr of 0.85 mg/dL). Liver Function Tests: Recent Labs  Lab 11/30/20 1804 12/01/20 0356 12/02/20 0353 12/03/20 0842 12/04/20 0327 12/05/20 0325 12/06/20 1039  AST 26 28 41 36 33  --   --   ALT 10 9 15 18 14   --   --   ALKPHOS 85 87 116 119 119  --   --   BILITOT 0.5 0.5 0.9 1.0 0.9  --   --   PROT 5.1* 4.9* 5.1* 4.8* 5.1*  --   --   ALBUMIN 1.9* 1.7* 1.7* 1.5* 1.4* 1.3* 2.3*   No results for input(s): LIPASE, AMYLASE in the last 168 hours. No results for input(s): AMMONIA in the last 168 hours. Coagulation Profile: No results for input(s): INR, PROTIME in the last 168 hours. Cardiac Enzymes: No results for input(s): CKTOTAL, CKMB, CKMBINDEX, TROPONINI in the last 168 hours. BNP (last 3 results) No results for input(s): PROBNP in the last 8760 hours. HbA1C: No results for input(s): HGBA1C in the last 72 hours. CBG: No results for input(s): GLUCAP in the last 168 hours. Lipid Profile: No results for input(s): CHOL, HDL, LDLCALC, TRIG, CHOLHDL, LDLDIRECT in the last 72 hours. Thyroid Function Tests: No results for input(s): TSH, T4TOTAL, FREET4, T3FREE, THYROIDAB in the last 72 hours. Anemia Panel: No results for input(s): VITAMINB12, FOLATE, FERRITIN, TIBC, IRON, RETICCTPCT in the last 72 hours. Sepsis Labs: No results for input(s): PROCALCITON, LATICACIDVEN in the last 168 hours.  Recent Results (from the past 240 hour(s))  Resp Panel by RT-PCR (Flu A&B, Covid) Nasopharyngeal Swab     Status: None   Collection Time: 11/27/20  1:51 PM   Specimen: Nasopharyngeal Swab; Nasopharyngeal(NP) swabs in vial transport medium  Result  Value Ref Range  Status   SARS Coronavirus 2 by RT PCR NEGATIVE NEGATIVE Final    Comment: (NOTE) SARS-CoV-2 target nucleic acids are NOT DETECTED.  The SARS-CoV-2 RNA is generally detectable in upper respiratory specimens during the acute phase of infection. The lowest concentration of SARS-CoV-2 viral copies this assay can detect is 138 copies/mL. A negative result does not preclude SARS-Cov-2 infection and should not be used as the sole basis for treatment or other patient management decisions. A negative result may occur with  improper specimen collection/handling, submission of specimen other than nasopharyngeal swab, presence of viral mutation(s) within the areas targeted by this assay, and inadequate number of viral copies(<138 copies/mL). A negative result must be combined with clinical observations, patient history, and epidemiological information. The expected result is Negative.  Fact Sheet for Patients:  EntrepreneurPulse.com.au  Fact Sheet for Healthcare Providers:  IncredibleEmployment.be  This test is no t yet approved or cleared by the [place] FDA and  has been authorized for detection and/or diagnosis of SARS-CoV-2 by FDA under an Emergency Use Authorization (EUA). This EUA will remain  in effect (meaning this test can be used) for the duration of the COVID-19 declaration under Section 564(b)(1) of the Act, 21 U.S.C.section 360bbb-3(b)(1), unless the authorization is terminated  or revoked sooner.       Influenza A by PCR NEGATIVE NEGATIVE Final   Influenza B by PCR NEGATIVE NEGATIVE Final    Comment: (NOTE) The Xpert Xpress SARS-CoV-2/FLU/RSV plus assay is intended as an aid in the diagnosis of influenza from Nasopharyngeal swab specimens and should not be used as a sole basis for treatment. Nasal washings and aspirates are unacceptable for Xpert Xpress SARS-CoV-2/FLU/RSV testing.  Fact Sheet for  Patients: EntrepreneurPulse.com.au  Fact Sheet for Healthcare Providers: IncredibleEmployment.be  This test is not yet approved or cleared by the [place] FDA and has been authorized for detection and/or diagnosis of SARS-CoV-2 by FDA under an Emergency Use Authorization (EUA). This EUA will remain in effect (meaning this test can be used) for the duration of the COVID-19 declaration under Section 564(b)(1) of the Act, 21 U.S.C. section 360bbb-3(b)(1), unless the authorization is terminated or revoked.  Performed at [facility], [place] [address] [place], [place] [address]   Urine Culture     Status: None   Collection Time: 11/27/20  3:25 PM   Specimen: Kidney; Urine  Result Value Ref Range Status   Specimen Description   Final    KIDNEY LEFT NEPHROSTOMY Performed at [facility] [address], [place], [place] [address]    Special Requests   Final    NONE Performed at [facility], [place] [address] [place], [place] [address]    Culture   Final    NO GROWTH Performed at [facility], [place] [address], [place], [place] [address]    Report Status 11/29/2020 FINAL  Final  Urine Culture     Status: None   Collection Time: 11/27/20  3:25 PM   Specimen: Kidney; Urine  Result Value Ref Range Status   Specimen Description   Final    KIDNEY RIGHT NEPHROSTOMY Performed at [facility] [address], [place], [place] [address]    Special Requests   Final    NONE Performed at [facility], [place] [address] [place], [place] [address]    Culture   Final    NO GROWTH Performed at [facility],  [address] [place], [place] [address]    Report Status 11/29/2020 FINAL  Final  Culture, blood (routine x 2)     Status: None (Preliminary result)   Collection Time: 12/05/20  1:51 PM   Specimen: BLOOD  Result Value Ref Range Status    Specimen Description   Final    BLOOD RIGHT ANTECUBITAL Performed at [facility] [address], [place], [place] [address]    Special Requests   Final    BOTTLES DRAWN AEROBIC AND ANAEROBIC Blood Culture adequate volume Performed at [facility] [address] [place], [place] [address]    Culture   Final    NO GROWTH < 12 HOURS Performed at [facility] [address] [place], [place] [address]    Report Status PENDING  Incomplete  Culture, blood (routine x 2)     Status: None (Preliminary result)   Collection Time: 12/05/20  1:59 PM   Specimen: BLOOD RIGHT FOREARM  Result Value Ref Range Status   Specimen Description   Final    BLOOD RIGHT FOREARM Performed at [facility] [address] [place], [place] [address]    Special Requests   Final    BOTTLES DRAWN AEROBIC AND ANAEROBIC Blood Culture adequate volume Performed at [facility] [address], [place], [place] [address]    Culture   Final    NO GROWTH < 12 HOURS Performed at [place] [address], [place], [place] [address]    Report Status PENDING  Incomplete  Aerobic Culture w Gram Stain (superficial specimen)     Status: None (Preliminary result)   Collection Time: 12/05/20  4:25 PM   Specimen: JP Drain  Result Value Ref Range Status   Specimen Description   Final    JP DRAINAGE Performed at [facility], [place] [address] [place], [place] [address]    Special Requests   Final    NONE Performed at [facility], [place] [address], [place], [place] [address]    Gram Stain   Final    NO ORGANISMS SEEN SQUAMOUS EPITHELIAL CELLS PRESENT ABUNDANT WBC PRESENT,BOTH PMN AND MONONUCLEAR ABUNDANT GRAM NEGATIVE RODS    Culture   Final    ABUNDANT GRAM NEGATIVE RODS CULTURE REINCUBATED FOR BETTER GROWTH Performed at [facility], [place] [address] [place], [place] [address]     Report Status PENDING  Incomplete      RN Pressure Injury Documentation:     Estimated body mass index is 31.17 kg/m as calculated from the following:   Height as of this encounter: 5\' 8"  (1.727 m).   Weight as of this encounter: 93 kg.  Malnutrition Type:  Nutrition Problem: Moderate Malnutrition Etiology: chronic illness, cancer and cancer related treatments  Malnutrition Characteristics:  Signs/Symptoms: mild fat depletion, mild muscle depletion, moderate muscle depletion  Nutrition Interventions:  Interventions: Prostat, Boost Breeze, MVI   Radiology Studies: DG CHEST PORT 1 VIEW  Result Date: 12/05/2020 CLINICAL DATA:  70 year old male history of pleural effusion. EXAM: PORTABLE CHEST 1 VIEW COMPARISON:  Chest x-ray 10/27/2020. FINDINGS: Right internal jugular single-lumen power porta cath with tip terminating at the superior cavoatrial junction. Lung volumes are low. There are bibasilar opacities which may reflect areas of atelectasis and/or consolidation, likely with superimposed small bilateral pleural effusions. No pneumothorax. No evidence of pulmonary edema. Heart size is normal. Upper mediastinal contours are within normal limits.  IMPRESSION: 1. Low lung volumes with bibasilar opacities which may reflect areas of atelectasis and/or consolidation, likely with small bilateral pleural effusions. Electronically Signed   By: Vinnie Langton M.D.   On: 12/05/2020 15:03    Scheduled Meds:  (feeding supplement) PROSource Plus  30 mL Oral BID BM   acetaminophen (TYLENOL) oral liquid 160 mg/5 mL  650 mg Oral Q6H   vitamin C  1,000 mg Oral Daily   chlorhexidine  15 mL Mouth Rinse BID   Chlorhexidine Gluconate Cloth  6 each Topical Daily   feeding supplement  1 Container Oral Q24H   furosemide  20 mg Intravenous BID   gabapentin  200 mg Oral BID   mouth rinse  15 mL Mouth Rinse q12n4p   methocarbamol  1,000 mg Oral TID   mirtazapine  7.5 mg Oral QHS   multivitamin with  minerals  1 tablet Oral Daily   predniSONE  5 mg Oral Q breakfast   sodium hypochlorite   Irrigation BID   Continuous Infusions:  albumin human     piperacillin-tazobactam (ZOSYN)  IV 3.375 g (12/06/20 1343)   potassium chloride 10 mEq (12/06/20 1829)    LOS: 9 days   Kathie Dike, MD [facility] PAGER is on [place]  If 7PM-7AM, please contact night-coverage [email or web address]

## 2020-12-07 ENCOUNTER — Ambulatory Visit: Payer: Medicare Other

## 2020-12-07 LAB — CBC
HCT: 22.8 % — ABNORMAL LOW (ref 39.0–52.0)
Hemoglobin: 7.2 g/dL — ABNORMAL LOW (ref 13.0–17.0)
MCH: 29.9 pg (ref 26.0–34.0)
MCHC: 31.6 g/dL (ref 30.0–36.0)
MCV: 94.6 fL (ref 80.0–100.0)
Platelets: 422 10*3/uL — ABNORMAL HIGH (ref 150–400)
RBC: 2.41 MIL/uL — ABNORMAL LOW (ref 4.22–5.81)
RDW: 20.1 % — ABNORMAL HIGH (ref 11.5–15.5)
WBC: 25.2 10*3/uL — ABNORMAL HIGH (ref 4.0–10.5)
nRBC: 0.6 % — ABNORMAL HIGH (ref 0.0–0.2)

## 2020-12-07 LAB — URINE CULTURE: Culture: 10000 — AB

## 2020-12-07 LAB — RENAL FUNCTION PANEL
Albumin: 2.2 g/dL — ABNORMAL LOW (ref 3.5–5.0)
Anion gap: 3 — ABNORMAL LOW (ref 5–15)
BUN: 25 mg/dL — ABNORMAL HIGH (ref 8–23)
CO2: 22 mmol/L (ref 22–32)
Calcium: 8.1 mg/dL — ABNORMAL LOW (ref 8.9–10.3)
Chloride: 114 mmol/L — ABNORMAL HIGH (ref 98–111)
Creatinine, Ser: 0.89 mg/dL (ref 0.61–1.24)
GFR, Estimated: >60 mL/min (ref >60)
Glucose, Bld: 95 mg/dL (ref 70–99)
Phosphorus: 2.7 mg/dL (ref 2.5–4.6)
Potassium: 3.4 mmol/L — ABNORMAL LOW (ref 3.5–5.1)
Sodium: 139 mmol/L (ref 135–145)

## 2020-12-07 LAB — PREPARE RBC (CROSSMATCH)

## 2020-12-07 MED ORDER — PANTOPRAZOLE SODIUM 40 MG PO TBEC
40.0000 mg | DELAYED_RELEASE_TABLET | Freq: Every day | ORAL | Status: DC
  Administered 2020-12-07 – 2020-12-20 (×14): 40 mg via ORAL
  Filled 2020-12-07 (×14): qty 1

## 2020-12-07 MED ORDER — POTASSIUM CHLORIDE CRYS ER 20 MEQ PO TBCR
40.0000 meq | EXTENDED_RELEASE_TABLET | ORAL | Status: AC
Start: 2020-12-07 — End: 2020-12-07
  Administered 2020-12-07: 40 meq via ORAL
  Filled 2020-12-07: qty 2

## 2020-12-07 MED ORDER — SODIUM CHLORIDE 0.9% IV SOLUTION: Freq: Once | INTRAVENOUS | Status: AC

## 2020-12-07 NOTE — Progress Notes (Signed)
PROGRESS NOTE    Andre Glass  YPP:[id number] DOB: Dec 03, 1950 DOA: 11/27/2020 PCP: [name], DO  Brief Narrative:  The patient is a 70 year old African-American male with a past medical history significant for but not limited to metastatic prostate cancer with resultant obstructive uropathy requiring bilateral nephrostomy tube placement with other comorbidities who presented with worsening right-sided abdominal pain that started on the morning of yesterday.  The pain was found to be in the right lower quadrant and was a sharp 10 out of 10 in severity.  Patient dates that he passed gas yesterday and had several loose bowel movements but states nothing made the pain worse or better.  He recently had a CT scan on 11/20/2020 which showed possible malignant stricture was progressive and intra-abdominal metastatic disease and extensive osseous metastatic disease as well as evidence of bilateral lower extremity DVT and pelvic veins being compressed by pelvic adenopathy.  He had right peroneal DVT and acute left femoral DVT extending through the veins of the left lower leg and vascular surgery was consulted at that time and recommended no intervention discharged on anticoagulation with Xarelto.  He is currently on amoxicillin and Trulance without relief.  He was given fleets enema without relief in the ED.  He was also noted to have a hemoglobin of 6.6 down from 7.5 a few days ago.  He was transfused 1 unit PRBCs and general surgery as well as gastroenterology were consulted and they recommended bowel regimen and no surgical intervention unless the patient did not improve however subsequently after his enema today the patien likely perforated secondary to history of colon ulcer and had rigidity and guarding consistent with peritonitis.  General surgery is taking him to the OR for emergent surgery for a laparotomy, partial colectomy and colostomy and he is postoperative day 1 for exploratory laparotomy, sigmoid  colectomy and end colostomy with Dr. Yancey Flemings on 11/28/2020.  Findings intraoperatively showed a large perforation with diffuse feculent peritonitis and extensive contamination of the peritoneal cavity with stool and pus  General surgery feels that he is at very high risk for abscess formation and they are recommending following his labs closely and consider repeating a scan in next few days.  They are recommending continuing NG tube to low intermittent suction and await viable bowel function.  He does have some colostomy output.  Wound care has been consulted and he has been getting wet-to-dry dressings twice daily and pain control with IV acetaminophen.  His electrolytes and labs are been off so we will stop his potassium supplementation and change his fluid to D5W and give him a dose of Lokelma  Today his blood count dropped a little bit and he started having some blood from his nephrostomy in his colostomy so we will hold his heparin drip.  Surgery recommending continuing NG tube to low intermittent suction.  Continued to hold heparin drip given that his hemoglobin continues to drop and now that he has some blood in his nephrostomy in his colostomy.  However his hemoglobin has improved and his nephrostomy tubes had minimal blood in him so heparin drip was resumed.  He does have some blood-tinged urine now on his nephrostomies. Medical oncology evaluated and feel that his overall prognosis from process cancer General guarded with limited life expectancy.  Oncology feels that if he is able to obtain meaningful recovery that they will resume chemotherapy however the odds of this is happening is very small.  He pulled out his NG tube again and  it was not replaced. General Surgery feels that he is still at risk for intra-abdominal abscesses given his intraoperative findings and they are waiting for improvement in his distention before giving liquids or food from a p.o. standpoint.   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 Windle Guard on 11/28/2020 laparotomies/ -Patient presents with a 3-day history of fecal impaction secondary to prostate cancer along with his home Dilaudid -He has tried senna and MiraLAX in the past with out relief -Patient takes Movantik and flecainide and he was given Colace and senna trial of tapwater enemas as well as lactulose enemas -GI was consulted and recommended enemas the way he takes them -He subsequently became extremely hypotensive and had some guarding and rigidity of his abdomen and became diaphoretic -Stat KUB showed possible free air -General surgery was reconsulted and took the patient to the OR emergently -He underwent exploratory laparotomy, sigmoid colectomy and end colostomy. -Wound care has been consulted -Patient's NG tube has been removed and will hold off for now given that he continues to put out nightly -Patient's surgical pathology is back and it showed metastatic prostate cancer in the nodes -General surgery thinks his ostomy is tenuous but is working they are recommending continue IV antibiotics with IV Zosyn and JP drain for now -Culture from JP drain growing Pseudomonas -Palliative care has been consulted for goals of care discussion and appreciate their evaluation. Patient still wants full scope of care.  Metastatic prostate cancer with subsequent pain -Continue with Tylenol, hydrocodone and Dilaudid -General surgery started him on IV acetaminophen -Medical oncology was  consulted and they are recommending that if he has any meaningful recovery that they can resume chemotherapy but they feel this is small and they feel that his prognosis is guarded  Right Leg DVT -Currently on heparin infusion, but held in light of anemia -We will restart when hematuria has resolved and hemoglobin is stable -Restart on Xarelto when cleared by general surgery  AKI Metabolic Acidosis -Creatinine peaked at 1.3, now back down to 0.7 -He has a nongap metabolic acidosis which is mild and likely related to hyperchloremia -He has been receiving hypotonic fluids  Hypokalemia -Replace  Hypophosphatemia -Replace  Acute on chronic anemia likely of chronic disease, trending down further -Hemoglobin had initially declined to 6.6 and he was transfused 2 units PRBC. -Follow-up hemoglobin had improved -Subsequently, hemoglobin trended back down to 5.8, possibly from hematuria -He was transfused another 2 unit of PRBC -Continue to hold heparin for now  Hematuria -Patient noted to have hematuria in nephrostomies -We will hold further heparin for now and monitor  -Urine appears to be clearing, continue to monitor   Leukocytosis -WBC count currently trending down to 25.2 -CT abdomen repeated yesterday without any new findings -There was mention of evolving fluid collections, but no clear abscesses yet -He is currently on Zosyn and has been afebrile -Blood cultures have not shown any significant growth -Culture from JP drain shows Pseudomonas -Culture from nephrostomy did not show any significant growth  Anasarca -Likely related to low protein state in  the setting of IV fluid hydration -Further IV fluids have been discontinued -He does have some pleural effusions and some ascites -Started on albumin infusions and low-dose Lasix with improvement of edema.   DVT prophylaxis: Anticoagulated with a heparin drip  Code Status: Full code Family Communication: Spoke with his  brother Legrand Como at bedside Disposition Plan: Pending further clinical improvement and clearance by specialists  Status is: Inpatient  Remains inpatient appropriate because:Unsafe d/c plan, IV treatments appropriate due to intensity of illness or inability to take PO, and Inpatient level of care appropriate due to severity of illness  Dispo: The patient is from: Home              Anticipated d/c is to: SNF              Patient currently is not medically stable to d/c.   Difficult to place patient No  Consultants:  General surgery Gastroenterology Palliative care medicine Urology Medical Oncology  Procedures: Emergent surgery: Procedure performed: Exploratory laparotomy, sigmoid colectomy with end colostomy  Antimicrobials: Anti-infectives (From admission, onward)    Start     Dose/Rate Route Frequency Ordered Stop   11/28/20 2230  piperacillin-tazobactam (ZOSYN) IVPB 3.375 g        3.375 g 12.5 mL/hr over 240 Minutes Intravenous Every 8 hours 11/28/20 2121     11/28/20 2215  piperacillin-tazobactam (ZOSYN) IVPB 3.375 g  Status:  Discontinued       Note to Pharmacy: Given Intraoperative   3.375 g 100 mL/hr over 30 Minutes Intravenous Every 8 hours 11/28/20 2117 11/28/20 2121   11/28/20 1730  piperacillin-tazobactam (ZOSYN) IVPB 3.375 g       Note to Pharmacy: Given Intraoperative   3.375 g 100 mL/hr over 30 Minutes Intravenous  Once 11/28/20 1729 11/28/20 1750   11/28/20 1712  piperacillin-tazobactam (ZOSYN) 3.375 GM/50ML IVPB       Note to Pharmacy: Marchia Meiers   : cabinet override      11/28/20 1712 11/28/20 1735   11/28/20 1545  ceFAZolin (ANCEF) IVPB 2g/100 mL premix        2 g 200 mL/hr over 30 Minutes Intravenous  Once 11/28/20 1538 11/28/20 1630   11/28/20 1539  ceFAZolin (ANCEF) 2-4 GM/100ML-% IVPB       Note to Pharmacy: Minor, Anneita   : cabinet override      11/28/20 1539 11/28/20 1654   11/27/20 1700  cephALEXin (KEFLEX) capsule 500 mg  Status:   Discontinued        500 mg Oral 3 times daily 11/27/20 1640 11/28/20 2117        Subjective: We have not had any nausea or vomiting.  Tolerating p.o. intake.  Voice seems stronger.  Still feels very weak.  Objective: Vitals:   12/07/20 0552 12/07/20 1302 12/07/20 1729 12/07/20 1800  BP: 132/68 130/73 126/64 128/70  Pulse: 90 90 92 95  Resp: 18 18 19  (!) 22  Temp: 98.3 F (36.8 C) 98.4 F (36.9 C) 98.7 F (37.1 C) 98.7 F (37.1 C)  TempSrc: Oral Oral Oral Oral  SpO2: 97% 96% 98% 98%  Weight: 93.2 kg     Height:        Intake/Output Summary (Last 24 hours) at 12/07/2020 1924 Last data filed at 12/07/2020 1753 Gross per 24 hour  Intake 355.65 ml  Output 2950 ml  Net -2594.35 ml   Filed Weights   12/03/20 0505 12/04/20 0500 12/07/20 0552  Weight: 90.4 kg 93 kg 93.2 kg  Examination: Physical Exam:  General exam: Alert, awake, oriented x 3 Respiratory system: Clear to auscultation. Respiratory effort normal. Cardiovascular system:RRR. No murmurs, rubs, gallops. Gastrointestinal system: Abdomen is nondistended, soft and nontender. No organomegaly or masses felt. Normal bowel sounds heard. Hematuria in nephrostomies still present, but seems to be clearing. Colostomy in place Central nervous system: Alert and oriented. No focal neurological deficits. Extremities: No C/C/E, +pedal pulses Skin: No rashes, lesions or ulcers Psychiatry: Judgement and insight appear normal. Mood & affect appropriate.      Data Reviewed: I have personally reviewed following labs and imaging studies  CBC: Recent Labs  Lab 12/04/20 0327 12/05/20 0325 12/06/20 1039 12/06/20 2311 12/07/20 0454  WBC 26.6* 31.7* 32.3* 26.7* 25.2*  NEUTROABS 21.7*  --  31.3*  --   --   HGB 7.8* 7.6* 5.8* 7.2* 7.2*  HCT 25.0* 24.0* 18.7* 22.7* 22.8*  MCV 93.3 94.9 96.9 94.6 94.6  PLT 272 378 398 416* 585*   Basic Metabolic Panel: Recent Labs  Lab 12/01/20 0356 12/02/20 0353 12/03/20 0842 12/04/20 0327  12/05/20 0325 12/06/20 1039 12/07/20 0454  NA 141 140 136 142 140 139 139  K 4.2 3.5 2.9* 3.8 3.8 3.3* 3.4*  CL 116* 113* 108 115* 114* 113* 114*  CO2 19* 23 19* 19* 19* 18* 22  GLUCOSE 82 73 85 116* 78 98 95  BUN 28* 27* 22 14 13 21  25*  CREATININE 1.16 1.00 0.82 0.81 0.77 0.85 0.89  CALCIUM 7.9* 7.9* 7.4* 7.6* 7.7* 7.8* 8.1*  MG 2.1 2.1 2.0 2.0  --   --   --   PHOS 3.0 2.9 2.4* 2.3* 2.2* 3.4 2.7   GFR: Estimated Creatinine Clearance: 85.5 mL/min (by C-G formula based on SCr of 0.89 mg/dL). Liver Function Tests: Recent Labs  Lab 12/01/20 0356 12/02/20 0353 12/03/20 0842 12/04/20 0327 12/05/20 0325 12/06/20 1039 12/07/20 0454  AST 28 41 36 33  --   --   --   ALT 9 15 18 14   --   --   --   ALKPHOS 87 116 119 119  --   --   --   BILITOT 0.5 0.9 1.0 0.9  --   --   --   PROT 4.9* 5.1* 4.8* 5.1*  --   --   --   ALBUMIN 1.7* 1.7* 1.5* 1.4* 1.3* 2.3* 2.2*   No results for input(s): LIPASE, AMYLASE in the last 168 hours. No results for input(s): AMMONIA in the last 168 hours. Coagulation Profile: No results for input(s): INR, PROTIME in the last 168 hours. Cardiac Enzymes: No results for input(s): CKTOTAL, CKMB, CKMBINDEX, TROPONINI in the last 168 hours. BNP (last 3 results) No results for input(s): PROBNP in the last 8760 hours. HbA1C: No results for input(s): HGBA1C in the last 72 hours. CBG: No results for input(s): GLUCAP in the last 168 hours. Lipid Profile: No results for input(s): CHOL, HDL, LDLCALC, TRIG, CHOLHDL, LDLDIRECT in the last 72 hours. Thyroid Function Tests: No results for input(s): TSH, T4TOTAL, FREET4, T3FREE, THYROIDAB in the last 72 hours. Anemia Panel: No results for input(s): VITAMINB12, FOLATE, FERRITIN, TIBC, IRON, RETICCTPCT in the last 72 hours. Sepsis Labs: No results for input(s): PROCALCITON, LATICACIDVEN in the last 168 hours.  Recent Results (from the past 240 hour(s))  Culture, blood (routine x 2)     Status: None (Preliminary  result)   Collection Time: 12/05/20  1:51 PM   Specimen: BLOOD  Result Value Ref Range Status  Specimen Description   Final    BLOOD RIGHT ANTECUBITAL Performed at [facility] [address] [place], [place] [address]    Special Requests   Final    BOTTLES DRAWN AEROBIC AND ANAEROBIC Blood Culture adequate volume Performed at [facility] [address] [place], [place] [address]    Culture   Final    NO GROWTH 2 DAYS Performed at [facility] [address] [place], [place] [address]    Report Status PENDING  Incomplete  Culture, blood (routine x 2)     Status: None (Preliminary result)   Collection Time: 12/05/20  1:59 PM   Specimen: BLOOD RIGHT FOREARM  Result Value Ref Range Status   Specimen Description   Final    BLOOD RIGHT FOREARM Performed at [facility] [address], [place], [place] [address]    Special Requests   Final    BOTTLES DRAWN AEROBIC AND ANAEROBIC Blood Culture adequate volume Performed at [facility] [address], [place], [place] [address]    Culture   Final    NO GROWTH 2 DAYS Performed at [facility] [address], [place], [place] [address]    Report Status PENDING  Incomplete  Urine Culture     Status: Abnormal   Collection Time: 12/05/20  3:52 PM   Specimen: Urine, Random  Result Value Ref Range Status   Specimen Description   Final    URINE, RANDOM NEPHROSTOMY Performed at [facility] [address] [place], [place] [address]    Special Requests   Final    NONE Performed at [facility], [place] [address], [place], [place] [address]    Culture 10,000 COLONIES/mL YEAST (A)  Final   Report Status 12/07/2020 FINAL  Final  Aerobic Culture w Gram Stain (superficial specimen)     Status: None (Preliminary result)   Collection Time: 12/05/20  4:25 PM   Specimen: JP Drain  Result Value Ref Range Status    Specimen Description   Final    JP DRAINAGE Performed at [facility] [address] [place], [place] [address]    Special Requests   Final    NONE Performed at [facility], [place] [address] [place], [place] [address]    Gram Stain   Final    NO SQUAMOUS EPITHELIAL CELLS PRESENT ABUNDANT WBC PRESENT,BOTH PMN AND MONONUCLEAR ABUNDANT GRAM NEGATIVE RODS    Culture   Final    MODERATE PSEUDOMONAS AERUGINOSA CULTURE REINCUBATED FOR BETTER GROWTH Performed at [facility], [place] [address], [place], [place] [address]    Report Status PENDING  Incomplete      RN Pressure Injury Documentation:     Estimated body mass index is 31.24 kg/m as calculated from the following:   Height as of this encounter: 5\' 8"  (1.727 m).   Weight as of this encounter: 93.2 kg.  Malnutrition Type:  Nutrition Problem: Moderate Malnutrition Etiology: chronic illness, cancer and cancer related treatments  Malnutrition Characteristics:  Signs/Symptoms: mild fat depletion, mild muscle depletion, moderate muscle depletion  Nutrition Interventions:  Interventions: Prostat, Boost Breeze, MVI   Radiology Studies: No results found.  Scheduled Meds:  (feeding supplement) PROSource Plus  30 mL Oral BID BM   acetaminophen (TYLENOL) oral liquid 160 mg/5 mL  650 mg Oral Q6H   vitamin C  1,000 mg Oral Daily   chlorhexidine  15 mL Mouth Rinse BID   Chlorhexidine Gluconate Cloth  6 each Topical Daily   feeding supplement  1 Container Oral Q24H   furosemide  20 mg Intravenous BID   gabapentin  200 mg Oral BID   mouth rinse  15 mL Mouth Rinse q12n4p   methocarbamol  1,000 mg Oral TID   mirtazapine  7.5 mg Oral QHS   multivitamin with minerals  1 tablet Oral Daily   potassium chloride  40 mEq Oral Q4H   predniSONE  5 mg Oral Q breakfast   sodium hypochlorite   Irrigation BID   Continuous Infusions:  piperacillin-tazobactam (ZOSYN)  IV 3.375 g (12/07/20  1327)    LOS: 10 days   Kathie Dike, MD [facility] PAGER is on [place]  If 7PM-7AM, please contact night-coverage [email or web address]

## 2020-12-07 NOTE — Progress Notes (Signed)
Assessment & Plan: POD#9 - s/p ex lap, sigmoid colectomy, end colostomy - Dr. Kae Heller - 11/28/2020  Stercoral perforation with diffuse feculent peritonitis and extensive contamination of the peritoneal cavity - continue perc drain and IV abx - WBC 25K - CT findings as above, steroids - tolerating liquids, advance as tolerated - encourage PO pain meds over IV - WOC consulted, stoma is necrotic but appears to be viable above the fascia  - BID wet-to-dry dressing changes to midline wound - added Dakins 9/2 - incentive spirometry, PT/OT - mobilize as able - surgical pathology confirms metastatic prostate cancer with positive LN   FEN: Soft diet, SLIV ID: keflex. Zosyn 8/25>> VTE: heparin gtt   Metastatic prostate cancer ABL anemia on anemia of chronic disease - hgb 7.6 from 7.8, monitor DVT, b/l LE         Armandina Gemma, MD       [facility]       Office: [phone]   Chief Complaint: Metastatic prostate cancer with colon perforation  Subjective: Patient in bed, family at bedside.  Tolerating some oral diet.  Objective: Vital signs in last 24 hours: Temp:  [97.8 F (36.6 C)-98.3 F (36.8 C)] 98.3 F (36.8 C) (09/03 0552) Pulse Rate:  [84-93] 90 (09/03 0552) Resp:  [17-20] 18 (09/03 0552) BP: (113-132)/(61-80) 132/68 (09/03 0552) SpO2:  [97 %-100 %] 97 % (09/03 0552) Weight:  [93.2 kg] 93.2 kg (09/03 0552) Last BM Date: 12/07/20  Intake/Output from previous day: 09/02 0701 - 09/03 0700 In: 830 [I.V.:250; Blood:330; IV Piggyback:250] Out: 2980 [Urine:2350; Drains:5; Stool:625] Intake/Output this shift: No intake/output data recorded.  Physical Exam: HEENT - sclerae clear, mucous membranes moist Neck - soft Abdomen - ostomy LLQ with large liquid stool in bag, leaking; lower dressing dry and intact; JP drain with reddish dark brown output  Lab Results:  Recent Labs    12/06/20 2311 12/07/20 0454  WBC 26.7* 25.2*  HGB 7.2* 7.2*  HCT 22.7*  22.8*  PLT 416* 422*   BMET Recent Labs    12/06/20 1039 12/07/20 0454  NA 139 139  K 3.3* 3.4*  CL 113* 114*  CO2 18* 22  GLUCOSE 98 95  BUN 21 25*  CREATININE 0.85 0.89  CALCIUM 7.8* 8.1*   PT/INR No results for input(s): LABPROT, INR in the last 72 hours. Comprehensive Metabolic Panel:    Component Value Date/Time   NA 139 12/07/2020 0454   NA 139 12/06/2020 1039   NA 143 11/25/2020 1554   K 3.4 (L) 12/07/2020 0454   K 3.3 (L) 12/06/2020 1039   CL 114 (H) 12/07/2020 0454   CL 113 (H) 12/06/2020 1039   CO2 22 12/07/2020 0454   CO2 18 (L) 12/06/2020 1039   BUN 25 (H) 12/07/2020 0454   BUN 21 12/06/2020 1039   BUN 23 11/25/2020 1554   CREATININE 0.89 12/07/2020 0454   CREATININE 0.85 12/06/2020 1039   CREATININE 1.00 11/14/2020 1146   CREATININE 1.44 (H) 09/17/2020 1050   GLUCOSE 95 12/07/2020 0454   GLUCOSE 98 12/06/2020 1039   CALCIUM 8.1 (L) 12/07/2020 0454   CALCIUM 7.8 (L) 12/06/2020 1039   AST 33 12/04/2020 0327   AST 36 12/03/2020 0842   AST 35 11/14/2020 1146   AST 15 09/17/2020 1050   ALT 14 12/04/2020 0327   ALT 18 12/03/2020 0842   ALT 45 (H) 11/14/2020 1146   ALT 7 09/17/2020 1050   ALKPHOS 119 12/04/2020 0327  ALKPHOS 119 12/03/2020 0842   BILITOT 0.9 12/04/2020 0327   BILITOT 1.0 12/03/2020 0842   BILITOT 0.3 11/14/2020 1146   BILITOT 0.2 (L) 09/17/2020 1050   PROT 5.1 (L) 12/04/2020 0327   PROT 4.8 (L) 12/03/2020 0842   ALBUMIN 2.2 (L) 12/07/2020 0454   ALBUMIN 2.3 (L) 12/06/2020 1039    Studies/Results: DG CHEST PORT 1 VIEW  Result Date: 12/05/2020 CLINICAL DATA:  70 year old male history of pleural effusion. EXAM: PORTABLE CHEST 1 VIEW COMPARISON:  Chest x-ray 10/27/2020. FINDINGS: Right internal jugular single-lumen power porta cath with tip terminating at the superior cavoatrial junction. Lung volumes are low. There are bibasilar opacities which may reflect areas of atelectasis and/or consolidation, likely with superimposed small  bilateral pleural effusions. No pneumothorax. No evidence of pulmonary edema. Heart size is normal. Upper mediastinal contours are within normal limits. IMPRESSION: 1. Low lung volumes with bibasilar opacities which may reflect areas of atelectasis and/or consolidation, likely with small bilateral pleural effusions. Electronically Signed   By: Vinnie Langton M.D.   On: 12/05/2020 15:03      Armandina Gemma 12/07/2020   [name], male   DOB: January 23, 1951, 70 y.o.   MRN: [id number]

## 2020-12-08 DIAGNOSIS — K658 Other peritonitis: Secondary | ICD-10-CM

## 2020-12-08 DIAGNOSIS — K631 Perforation of intestine (nontraumatic): Secondary | ICD-10-CM

## 2020-12-08 LAB — BLOOD CULTURE ID PANEL (REFLEXED) - BCID2

## 2020-12-08 LAB — RENAL FUNCTION PANEL
Albumin: 2 g/dL — ABNORMAL LOW (ref 3.5–5.0)
Anion gap: 6 (ref 5–15)
BUN: 24 mg/dL — ABNORMAL HIGH (ref 8–23)
CO2: 21 mmol/L — ABNORMAL LOW (ref 22–32)
Calcium: 8.1 mg/dL — ABNORMAL LOW (ref 8.9–10.3)
Chloride: 111 mmol/L (ref 98–111)
Creatinine, Ser: 0.87 mg/dL (ref 0.61–1.24)
GFR, Estimated: >60 mL/min (ref >60)
Glucose, Bld: 90 mg/dL (ref 70–99)
Phosphorus: 3.3 mg/dL (ref 2.5–4.6)
Potassium: 3.6 mmol/L (ref 3.5–5.1)
Sodium: 138 mmol/L (ref 135–145)

## 2020-12-08 LAB — AEROBIC CULTURE W GRAM STAIN (SUPERFICIAL SPECIMEN)

## 2020-12-08 LAB — CBC
HCT: 26.2 % — ABNORMAL LOW (ref 39.0–52.0)
Hemoglobin: 8.3 g/dL — ABNORMAL LOW (ref 13.0–17.0)
MCH: 29.3 pg (ref 26.0–34.0)
MCHC: 31.7 g/dL (ref 30.0–36.0)
MCV: 92.6 fL (ref 80.0–100.0)
Platelets: 464 10*3/uL — ABNORMAL HIGH (ref 150–400)
RBC: 2.83 MIL/uL — ABNORMAL LOW (ref 4.22–5.81)
RDW: 19.9 % — ABNORMAL HIGH (ref 11.5–15.5)
WBC: 23.4 10*3/uL — ABNORMAL HIGH (ref 4.0–10.5)
nRBC: 0.5 % — ABNORMAL HIGH (ref 0.0–0.2)

## 2020-12-08 LAB — MAGNESIUM: Magnesium: 1.8 mg/dL (ref 1.7–2.4)

## 2020-12-08 MED ORDER — ALBUMIN HUMAN 25 % IV SOLN
25.0000 g | Freq: Four times a day (QID) | INTRAVENOUS | Status: AC
Start: 2020-12-08 — End: 2020-12-09
  Administered 2020-12-08 – 2020-12-09 (×2): 25 g via INTRAVENOUS
  Filled 2020-12-08 (×3): qty 100

## 2020-12-08 NOTE — Progress Notes (Signed)
PHARMACY - PHYSICIAN COMMUNICATION CRITICAL VALUE ALERT - BLOOD CULTURE IDENTIFICATION (BCID)  Andre Glass is an 70 y.o. male who presented to [facility] on 11/27/2020 with a chief complaint of abdominal pain. Surgery and GI consulted for fecal impaction.  Assessment:  +BCID for gram variable rods in 1/4 bottles. No organism identified by [employer] ; likely contaminant  Name of physician (or Provider) Contacted: X. Blount, FNP  Current antibiotics: Zosyn 3.375gm IV q8h (extended infusion)  Changes to prescribed antibiotics recommended:   Patient is on recommended antibiotics - No changes needed  [name], PharmD 12/08/2020  1:30 AM

## 2020-12-08 NOTE — Progress Notes (Signed)
Assessment & Plan: POD#10 - s/p ex lap, sigmoid colectomy, end colostomy - Dr. Kae Heller - 11/28/2020   Stercoral perforation with diffuse feculent peritonitis and extensive contamination of the peritoneal cavity - continue perc drain and IV abx - WBC 23K - slow improvement - dysphagia 3 diet - encourage PO pain meds over IV - WOC consulted, stoma is necrotic but appears to be viable above the fascia - will likely have some slough but hopefully will not need revision - BID wet-to-dry dressing changes to midline wound with Dakins solution - incentive spirometry, PT/OT - mobilize as able - surgical pathology confirms metastatic prostate cancer with positive LN   FEN: Soft diet, SLIV ID: keflex. Zosyn 8/25>> VTE: heparin gtt   Metastatic prostate cancer ABL anemia on anemia of chronic disease - hgb 7.6 from 7.8, monitor DVT, b/l LE         Armandina Gemma, MD       [facility]       Office: [phone]   Chief Complaint: Colonic perforation  Subjective: Patient up in bed drinking supplement, nurse at bedside.  More alert and interactive. No complaints.  Objective: Vital signs in last 24 hours: Temp:  [98 F (36.7 C)-98.7 F (37.1 C)] 98.6 F (37 C) (09/04 0639) Pulse Rate:  [84-95] 84 (09/04 0639) Resp:  [18-22] 20 (09/04 0639) BP: (117-136)/(64-73) 117/64 (09/04 0639) SpO2:  [96 %-99 %] 98 % (09/04 0639) Last BM Date: 12/07/20  Intake/Output from previous day: 09/03 0701 - 09/04 0700 In: 549.6 [P.O.:60; I.V.:5.7; Blood:384; IV Piggyback:99.9] Out: 2710 [Urine:2500; Drains:10; Stool:200] Intake/Output this shift: No intake/output data recorded.  Physical Exam: HEENT - sclerae clear, mucous membranes moist Neck - soft Abdomen - soft; stoma LLQ with superficial slough, liquid stool in bag; drains with thin brownish fluid; lower dressing dry and intact  Lab Results:  Recent Labs    12/07/20 0454 12/08/20 0321  WBC 25.2* 23.4*  HGB 7.2* 8.3*   HCT 22.8* 26.2*  PLT 422* 464*   BMET Recent Labs    12/07/20 0454 12/08/20 0321  NA 139 138  K 3.4* 3.6  CL 114* 111  CO2 22 21*  GLUCOSE 95 90  BUN 25* 24*  CREATININE 0.89 0.87  CALCIUM 8.1* 8.1*   PT/INR No results for input(s): LABPROT, INR in the last 72 hours. Comprehensive Metabolic Panel:    Component Value Date/Time   NA 138 12/08/2020 0321   NA 139 12/07/2020 0454   NA 143 11/25/2020 1554   K 3.6 12/08/2020 0321   K 3.4 (L) 12/07/2020 0454   CL 111 12/08/2020 0321   CL 114 (H) 12/07/2020 0454   CO2 21 (L) 12/08/2020 0321   CO2 22 12/07/2020 0454   BUN 24 (H) 12/08/2020 0321   BUN 25 (H) 12/07/2020 0454   BUN 23 11/25/2020 1554   CREATININE 0.87 12/08/2020 0321   CREATININE 0.89 12/07/2020 0454   CREATININE 1.00 11/14/2020 1146   CREATININE 1.44 (H) 09/17/2020 1050   GLUCOSE 90 12/08/2020 0321   GLUCOSE 95 12/07/2020 0454   CALCIUM 8.1 (L) 12/08/2020 0321   CALCIUM 8.1 (L) 12/07/2020 0454   AST 33 12/04/2020 0327   AST 36 12/03/2020 0842   AST 35 11/14/2020 1146   AST 15 09/17/2020 1050   ALT 14 12/04/2020 0327   ALT 18 12/03/2020 0842   ALT 45 (H) 11/14/2020 1146   ALT 7 09/17/2020 1050   ALKPHOS 119 12/04/2020 0327  ALKPHOS 119 12/03/2020 0842   BILITOT 0.9 12/04/2020 0327   BILITOT 1.0 12/03/2020 0842   BILITOT 0.3 11/14/2020 1146   BILITOT 0.2 (L) 09/17/2020 1050   PROT 5.1 (L) 12/04/2020 0327   PROT 4.8 (L) 12/03/2020 0842   ALBUMIN 2.0 (L) 12/08/2020 0321   ALBUMIN 2.2 (L) 12/07/2020 0454    Studies/Results: No results found.    Armandina Gemma 12/08/2020   Patient ID: Cathrine Muster, male   DOB: 1950-04-10, 70 y.o.   MRN: [id number]

## 2020-12-08 NOTE — Plan of Care (Signed)
  Problem: Health Behavior/Discharge Planning: Goal: Ability to manage health-related needs will improve Outcome: Progressing   Problem: Clinical Measurements: Goal: Ability to maintain clinical measurements within normal limits will improve Outcome: Progressing Goal: Diagnostic test results will improve Outcome: Progressing   

## 2020-12-08 NOTE — Progress Notes (Signed)
PROGRESS NOTE    Andre Glass  SVX:[id number] DOB: 1951-03-04 DOA: 11/27/2020 PCP: [name], DO  Brief Narrative:  The patient is a 70 year old African-American male with a past medical history significant for but not limited to metastatic prostate cancer with resultant obstructive uropathy requiring bilateral nephrostomy tube placement with other comorbidities who presented with worsening right-sided abdominal pain that started on the morning of yesterday.  The pain was found to be in the right lower quadrant and was a sharp 10 out of 10 in severity.  Patient dates that he passed gas yesterday and had several loose bowel movements but states nothing made the pain worse or better.  He recently had a CT scan on 11/20/2020 which showed possible malignant stricture was progressive and intra-abdominal metastatic disease and extensive osseous metastatic disease as well as evidence of bilateral lower extremity DVT and pelvic veins being compressed by pelvic adenopathy.  He had right peroneal DVT and acute left femoral DVT extending through the veins of the left lower leg and vascular surgery was consulted at that time and recommended no intervention discharged on anticoagulation with Xarelto.  He is currently on amoxicillin and Trulance without relief.  He was given fleets enema without relief in the ED.  He was also noted to have a hemoglobin of 6.6 down from 7.5 a few days ago.  He was transfused 1 unit PRBCs and general surgery as well as gastroenterology were consulted and they recommended bowel regimen and no surgical intervention unless the patient did not improve however subsequently after his enema today the patien likely perforated secondary to history of colon ulcer and had rigidity and guarding consistent with peritonitis.  General surgery is taking him to the OR for emergent surgery for a laparotomy, partial colectomy and colostomy and he is postoperative day 1 for exploratory laparotomy, sigmoid  colectomy and end colostomy with Dr. Yancey Flemings on 11/28/2020.  Findings intraoperatively showed a large perforation with diffuse feculent peritonitis and extensive contamination of the peritoneal cavity with stool and pus  General surgery feels that he is at very high risk for abscess formation and they are recommending following his labs closely and consider repeating a scan in next few days.  They are recommending continuing NG tube to low intermittent suction and await viable bowel function.  He does have some colostomy output.  Wound care has been consulted and he has been getting wet-to-dry dressings twice daily and pain control with IV acetaminophen.  His electrolytes and labs are been off so we will stop his potassium supplementation and change his fluid to D5W and give him a dose of Lokelma  Today his blood count dropped a little bit and he started having some blood from his nephrostomy in his colostomy so we will hold his heparin drip.  Surgery recommending continuing NG tube to low intermittent suction.  Continued to hold heparin drip given that his hemoglobin continues to drop and now that he has some blood in his nephrostomy in his colostomy.  However his hemoglobin has improved and his nephrostomy tubes had minimal blood in him so heparin drip was resumed.  He does have some blood-tinged urine now on his nephrostomies. Medical oncology evaluated and feel that his overall prognosis from process cancer General guarded with limited life expectancy.  Oncology feels that if he is able to obtain meaningful recovery that they will resume chemotherapy however the odds of this is happening is very small.  He pulled out his NG tube again and  it was not replaced. General Surgery feels that he is still at risk for intra-abdominal abscesses given his intraoperative findings and they are waiting for improvement in his distention before giving liquids or food from a p.o. standpoint.   Assessment & Plan:    Principal Problem:   Stercoral perforation of colon s/p Hartmann colectomy/colostomy 11/28/2020 Active Problems:   Metastatic castration-resistant adenocarcinoma of prostate (HCC)   BPH (benign prostatic hyperplasia)   Symptomatic anemia   Fecal impaction (HCC)   Hypokalemia   Hypomagnesemia   DVT of deep femoral vein, left (HCC)   Malnutrition of moderate degree   Fecal peritonitis (HCC)  Fecal impaction with acute on chronic constipation and possible stercoral ulcer with perforation resulting in peritonitis status post exploratory laparotomy, sigmoid colectomy and end colostomy by Dr. Windle Guard on 11/28/2020 laparotomies/ -Patient presents with a 3-day history of fecal impaction secondary to prostate cancer along with his home Dilaudid -He has tried senna and MiraLAX in the past with out relief -Patient takes Movantik and flecainide and he was given Colace and senna trial of tapwater enemas as well as lactulose enemas -GI was consulted and recommended enemas the way he takes them -He subsequently became extremely hypotensive and had some guarding and rigidity of his abdomen and became diaphoretic -Stat KUB showed possible free air -General surgery was reconsulted and took the patient to the OR emergently -He underwent exploratory laparotomy, sigmoid colectomy and end colostomy. -Wound care has been consulted -Patient's NG tube has been removed and will hold off for now given that he continues to put out nightly -Patient's surgical pathology is back and it showed metastatic prostate cancer in the nodes -General surgery thinks his ostomy is tenuous but is working they are recommending continue IV antibiotics with IV Zosyn and JP drain for now -Culture from JP drain growing Pseudomonas and E. coli -Palliative care has been consulted for goals of care discussion and appreciate their evaluation. Patient still wants full scope of care.  Metastatic prostate cancer with subsequent pain -Continue  with Tylenol, hydrocodone and Dilaudid -General surgery started him on IV acetaminophen -Medical oncology was consulted and they are recommending that if he has any meaningful recovery that they can resume chemotherapy but they feel this is small and they feel that his prognosis is guarded  Right Leg DVT -He was being treated with heparin infusion, but held in light of anemia -We will restart when hematuria has resolved and hemoglobin is stable, hopefully next 24 hours -Restart on Xarelto when cleared by general surgery   Acute on chronic anemia likely of chronic disease, trending down further -Hemoglobin had initially declined to 6.6 and he was transfused 2 units PRBC. -Follow-up hemoglobin had improved -Subsequently, hemoglobin trended back down to 5.8, possibly from hematuria -He was transfused another 2 unit of PRBC -Follow-up hemoglobin improved. -Continue to hold heparin for now  Hematuria -Patient noted to have hematuria in nephrostomies -We will hold further heparin for now and monitor  -Urine appears to be clearing, continue to monitor   Leukocytosis -WBC count currently trending down to 23.4 -CT abdomen repeated yesterday without any new findings -There was mention of evolving fluid collections, but no clear abscesses yet -He is currently on Zosyn and has been afebrile -Blood cultures have not shown any significant growth -Culture from JP drain shows Pseudomonas and E. coli -Culture from nephrostomy did not show any significant growth  Anasarca -Likely related to low protein state in the setting of IV fluid hydration -Further  IV fluids have been discontinued -He does have some pleural effusions and some ascites -Started on albumin infusions and low-dose Lasix with improvement of edema.   DVT prophylaxis: Anticoagulated with a heparin drip  Code Status: Full code Family Communication: Spoke with his brother Legrand Como at bedside Disposition Plan: Pending further  clinical improvement and clearance by specialists  Status is: Inpatient  Remains inpatient appropriate because:Unsafe d/c plan, IV treatments appropriate due to intensity of illness or inability to take PO, and Inpatient level of care appropriate due to severity of illness  Dispo: The patient is from: Home              Anticipated d/c is to: SNF              Patient currently is not medically stable to d/c.   Difficult to place patient No  Consultants:  General surgery Gastroenterology Palliative care medicine Urology Medical Oncology  Procedures: Emergent surgery: Procedure performed: Exploratory laparotomy, sigmoid colectomy with end colostomy  Antimicrobials: Anti-infectives (From admission, onward)    Start     Dose/Rate Route Frequency Ordered Stop   11/28/20 2230  piperacillin-tazobactam (ZOSYN) IVPB 3.375 g        3.375 g 12.5 mL/hr over 240 Minutes Intravenous Every 8 hours 11/28/20 2121     11/28/20 2215  piperacillin-tazobactam (ZOSYN) IVPB 3.375 g  Status:  Discontinued       Note to Pharmacy: Given Intraoperative   3.375 g 100 mL/hr over 30 Minutes Intravenous Every 8 hours 11/28/20 2117 11/28/20 2121   11/28/20 1730  piperacillin-tazobactam (ZOSYN) IVPB 3.375 g       Note to Pharmacy: Given Intraoperative   3.375 g 100 mL/hr over 30 Minutes Intravenous  Once 11/28/20 1729 11/28/20 1750   11/28/20 1712  piperacillin-tazobactam (ZOSYN) 3.375 GM/50ML IVPB       Note to Pharmacy: Marchia Meiers   : cabinet override      11/28/20 1712 11/28/20 1735   11/28/20 1545  ceFAZolin (ANCEF) IVPB 2g/100 mL premix        2 g 200 mL/hr over 30 Minutes Intravenous  Once 11/28/20 1538 11/28/20 1630   11/28/20 1539  ceFAZolin (ANCEF) 2-4 GM/100ML-% IVPB       Note to Pharmacy: Minor, Anneita   : cabinet override      11/28/20 1539 11/28/20 1654   11/27/20 1700  cephALEXin (KEFLEX) capsule 500 mg  Status:  Discontinued        500 mg Oral 3 times daily 11/27/20 1640 11/28/20  2117        Subjective: Does not have any new complaints today.  Objective: Vitals:   12/07/20 2051 12/07/20 2100 12/08/20 0639 12/08/20 1430  BP: 136/68 134/71 117/64 128/73  Pulse: 92 92 84 85  Resp: 18 19 20 20   Temp: 98 F (36.7 C) 98.3 F (36.8 C) 98.6 F (37 C) 98.2 F (36.8 C)  TempSrc: Oral Oral Oral Oral  SpO2: 98% 99% 98% 99%  Weight:      Height:        Intake/Output Summary (Last 24 hours) at 12/08/2020 1857 Last data filed at 12/08/2020 1845 Gross per 24 hour  Intake 784 ml  Output 1910 ml  Net -1126 ml   Filed Weights   12/03/20 0505 12/04/20 0500 12/07/20 0552  Weight: 90.4 kg 93 kg 93.2 kg   Examination: Physical Exam:  General exam: Alert, awake, oriented x 3 Respiratory system: Clear to auscultation. Respiratory effort normal. Cardiovascular system:RRR.  No murmurs, rubs, gallops. Gastrointestinal system: Abdomen is nondistended, soft and nontender. No organomegaly or masses felt. Normal bowel sounds heard.  Ostomy left lower quadrant.  Bilateral nephrostomies with urine largely clearing of blood Central nervous system: Alert and oriented. No focal neurological deficits. Extremities: Anasarca is present Skin: No rashes, lesions or ulcers Psychiatry: Judgement and insight appear normal. Mood & affect appropriate.   Data Reviewed: I have personally reviewed following labs and imaging studies  CBC: Recent Labs  Lab 12/04/20 0327 12/05/20 0325 12/06/20 1039 12/06/20 2311 12/07/20 0454 12/08/20 0321  WBC 26.6* 31.7* 32.3* 26.7* 25.2* 23.4*  NEUTROABS 21.7*  --  31.3*  --   --   --   HGB 7.8* 7.6* 5.8* 7.2* 7.2* 8.3*  HCT 25.0* 24.0* 18.7* 22.7* 22.8* 26.2*  MCV 93.3 94.9 96.9 94.6 94.6 92.6  PLT 272 378 398 416* 422* 045*   Basic Metabolic Panel: Recent Labs  Lab 12/02/20 0353 12/03/20 0842 12/04/20 0327 12/05/20 0325 12/06/20 1039 12/07/20 0454 12/08/20 0321  NA 140 136 142 140 139 139 138  K 3.5 2.9* 3.8 3.8 3.3* 3.4* 3.6  CL  113* 108 115* 114* 113* 114* 111  CO2 23 19* 19* 19* 18* 22 21*  GLUCOSE 73 85 116* 78 98 95 90  BUN 27* 22 14 13 21  25* 24*  CREATININE 1.00 0.82 0.81 0.77 0.85 0.89 0.87  CALCIUM 7.9* 7.4* 7.6* 7.7* 7.8* 8.1* 8.1*  MG 2.1 2.0 2.0  --   --   --  1.8  PHOS 2.9 2.4* 2.3* 2.2* 3.4 2.7 3.3   GFR: Estimated Creatinine Clearance: 87.5 mL/min (by C-G formula based on SCr of 0.87 mg/dL). Liver Function Tests: Recent Labs  Lab 12/02/20 0353 12/03/20 0842 12/04/20 0327 12/05/20 0325 12/06/20 1039 12/07/20 0454 12/08/20 0321  AST 41 36 33  --   --   --   --   ALT 15 18 14   --   --   --   --   ALKPHOS 116 119 119  --   --   --   --   BILITOT 0.9 1.0 0.9  --   --   --   --   PROT 5.1* 4.8* 5.1*  --   --   --   --   ALBUMIN 1.7* 1.5* 1.4* 1.3* 2.3* 2.2* 2.0*   No results for input(s): LIPASE, AMYLASE in the last 168 hours. No results for input(s): AMMONIA in the last 168 hours. Coagulation Profile: No results for input(s): INR, PROTIME in the last 168 hours. Cardiac Enzymes: No results for input(s): CKTOTAL, CKMB, CKMBINDEX, TROPONINI in the last 168 hours. BNP (last 3 results) No results for input(s): PROBNP in the last 8760 hours. HbA1C: No results for input(s): HGBA1C in the last 72 hours. CBG: No results for input(s): GLUCAP in the last 168 hours. Lipid Profile: No results for input(s): CHOL, HDL, LDLCALC, TRIG, CHOLHDL, LDLDIRECT in the last 72 hours. Thyroid Function Tests: No results for input(s): TSH, T4TOTAL, FREET4, T3FREE, THYROIDAB in the last 72 hours. Anemia Panel: No results for input(s): VITAMINB12, FOLATE, FERRITIN, TIBC, IRON, RETICCTPCT in the last 72 hours. Sepsis Labs: No results for input(s): PROCALCITON, LATICACIDVEN in the last 168 hours.  Recent Results (from the past 240 hour(s))  Culture, blood (routine x 2)     Status: Abnormal (Preliminary result)   Collection Time: 12/05/20  1:51 PM   Specimen: BLOOD  Result Value Ref Range Status   Specimen  Description  Final    BLOOD RIGHT ANTECUBITAL Performed at [facility] [address], [place], [place] [address]    Special Requests   Final    BOTTLES DRAWN AEROBIC AND ANAEROBIC Blood Culture adequate volume Performed at [facility] [address], [place], [place] [address]    Culture  Setup Time (A)  Final    GRAM VARIABLE ROD ANAEROBIC BOTTLE ONLY CRITICAL RESULT CALLED TO, READ BACK BY AND VERIFIED WITH: L POINDEXTER,[email or web address]  12/08/20 [facility]    Culture (A)  Final    GRAM VARIABLE ROD CULTURE REINCUBATED FOR BETTER GROWTH Performed at [facility], [place] [address], [place], [place] [address]    Report Status PENDING  Incomplete  Blood Culture ID Panel (Reflexed)     Status: None   Collection Time: 12/05/20  1:51 PM  Result Value Ref Range Status   Enterococcus faecalis NOT DETECTED NOT DETECTED Final   Enterococcus Faecium NOT DETECTED NOT DETECTED Final   Listeria monocytogenes NOT DETECTED NOT DETECTED Final   Staphylococcus species NOT DETECTED NOT DETECTED Final   Staphylococcus aureus (BCID) NOT DETECTED NOT DETECTED Final   Staphylococcus epidermidis NOT DETECTED NOT DETECTED Final   Staphylococcus lugdunensis NOT DETECTED NOT DETECTED Final   Streptococcus species NOT DETECTED NOT DETECTED Final   Streptococcus agalactiae NOT DETECTED NOT DETECTED Final   Streptococcus pneumoniae NOT DETECTED NOT DETECTED Final   Streptococcus pyogenes NOT DETECTED NOT DETECTED Final   A.calcoaceticus-baumannii NOT DETECTED NOT DETECTED Final   Bacteroides fragilis NOT DETECTED NOT DETECTED Final   Enterobacterales NOT DETECTED NOT DETECTED Final   Enterobacter cloacae complex NOT DETECTED NOT DETECTED Final   Escherichia coli NOT DETECTED NOT DETECTED Final   Klebsiella aerogenes NOT DETECTED NOT DETECTED Final   Klebsiella oxytoca NOT DETECTED NOT DETECTED Final   Klebsiella pneumoniae NOT DETECTED NOT DETECTED Final   Proteus  species NOT DETECTED NOT DETECTED Final   Salmonella species NOT DETECTED NOT DETECTED Final   Serratia marcescens NOT DETECTED NOT DETECTED Final   Haemophilus influenzae NOT DETECTED NOT DETECTED Final   Neisseria meningitidis NOT DETECTED NOT DETECTED Final   Pseudomonas aeruginosa NOT DETECTED NOT DETECTED Final   Stenotrophomonas maltophilia NOT DETECTED NOT DETECTED Final   Candida albicans NOT DETECTED NOT DETECTED Final   Candida auris NOT DETECTED NOT DETECTED Final   Candida glabrata NOT DETECTED NOT DETECTED Final   Candida krusei NOT DETECTED NOT DETECTED Final   Candida parapsilosis NOT DETECTED NOT DETECTED Final   Candida tropicalis NOT DETECTED NOT DETECTED Final   Cryptococcus neoformans/gattii NOT DETECTED NOT DETECTED Final    Comment: Performed at [facility], [address] [place], [place] [address]  Culture, blood (routine x 2)     Status: None (Preliminary result)   Collection Time: 12/05/20  1:59 PM   Specimen: BLOOD RIGHT FOREARM  Result Value Ref Range Status   Specimen Description   Final    BLOOD RIGHT FOREARM Performed at [facility] [address], [place], [place] [address]    Special Requests   Final    BOTTLES DRAWN AEROBIC AND ANAEROBIC Blood Culture adequate volume Performed at [facility] [address] [place], [place] [address]    Culture   Final    NO GROWTH 3 DAYS Performed at [facility], [place] [address], [place], [place] [address]    Report Status PENDING  Incomplete  Urine Culture  Status: Abnormal   Collection Time: 12/05/20  3:52 PM   Specimen: Urine, Random  Result Value Ref Range Status   Specimen Description   Final    URINE, RANDOM NEPHROSTOMY Performed at [facility] [address], [place], [place] [address]    Special Requests   Final    NONE Performed at [facility], [place] [address] [place], [place] [address]    Culture  10,000 COLONIES/mL YEAST (A)  Final   Report Status 12/07/2020 FINAL  Final  Aerobic Culture w Gram Stain (superficial specimen)     Status: None   Collection Time: 12/05/20  4:25 PM   Specimen: JP Drain  Result Value Ref Range Status   Specimen Description   Final    JP DRAINAGE Performed at [facility] [address], [place], [place] [address]    Special Requests   Final    NONE Performed at [facility], [place] [address], [place], [place] [address]    Gram Stain   Final    NO SQUAMOUS EPITHELIAL CELLS PRESENT ABUNDANT WBC PRESENT,BOTH PMN AND MONONUCLEAR ABUNDANT GRAM NEGATIVE RODS Performed at [facility], [place] [address], [place], [place] [address]    Culture   Final    ABUNDANT ESCHERICHIA COLI MODERATE PSEUDOMONAS AERUGINOSA    Report Status 12/08/2020 FINAL  Final   Organism ID, Bacteria ESCHERICHIA COLI  Final   Organism ID, Bacteria PSEUDOMONAS AERUGINOSA  Final      Susceptibility   Escherichia coli - MIC*    AMPICILLIN >=32 RESISTANT Resistant     CEFAZOLIN <=4 SENSITIVE Sensitive     CEFEPIME <=0.12 SENSITIVE Sensitive     CEFTAZIDIME <=1 SENSITIVE Sensitive     CEFTRIAXONE <=0.25 SENSITIVE Sensitive     CIPROFLOXACIN <=0.25 SENSITIVE Sensitive     GENTAMICIN <=1 SENSITIVE Sensitive     IMIPENEM <=0.25 SENSITIVE Sensitive     TRIMETH/SULFA >=320 RESISTANT Resistant     AMPICILLIN/SULBACTAM >=32 RESISTANT Resistant     PIP/TAZO <=4 SENSITIVE Sensitive     * ABUNDANT ESCHERICHIA COLI   Pseudomonas aeruginosa - MIC*    CEFTAZIDIME 4 SENSITIVE Sensitive     CIPROFLOXACIN <=0.25 SENSITIVE Sensitive     GENTAMICIN <=1 SENSITIVE Sensitive     IMIPENEM 2 SENSITIVE Sensitive     PIP/TAZO <=4 SENSITIVE Sensitive     CEFEPIME 2 SENSITIVE Sensitive     * MODERATE PSEUDOMONAS AERUGINOSA      RN Pressure Injury Documentation:     Estimated body mass index is 31.24 kg/m as calculated from the following:   Height as of  this encounter: 5\' 8"  (1.727 m).   Weight as of this encounter: 93.2 kg.  Malnutrition Type:  Nutrition Problem: Moderate Malnutrition Etiology: chronic illness, cancer and cancer related treatments  Malnutrition Characteristics:  Signs/Symptoms: mild fat depletion, mild muscle depletion, moderate muscle depletion  Nutrition Interventions:  Interventions: Prostat, Boost Breeze, MVI   Radiology Studies: No results found.  Scheduled Meds:  (feeding supplement) PROSource Plus  30 mL Oral BID BM   acetaminophen (TYLENOL) oral liquid 160 mg/5 mL  650 mg Oral Q6H   vitamin C  1,000 mg Oral Daily   chlorhexidine  15 mL Mouth Rinse BID   Chlorhexidine Gluconate Cloth  6 each Topical Daily   feeding supplement  1 Container Oral Q24H   furosemide  20 mg Intravenous BID   gabapentin  200 mg Oral BID   mouth  rinse  15 mL Mouth Rinse q12n4p   methocarbamol  1,000 mg Oral TID   mirtazapine  7.5 mg Oral QHS   multivitamin with minerals  1 tablet Oral Daily   pantoprazole  40 mg Oral Daily   predniSONE  5 mg Oral Q breakfast   sodium hypochlorite   Irrigation BID   Continuous Infusions:  piperacillin-tazobactam (ZOSYN)  IV 3.375 g (12/08/20 1449)    LOS: 11 days   Kathie Dike, MD [facility] PAGER is on AMION  If 7PM-7AM, please contact night-coverage [email or web address]

## 2020-12-08 NOTE — Plan of Care (Signed)
  Problem: Clinical Measurements: Goal: Ability to maintain clinical measurements within normal limits will improve Outcome: Progressing Goal: Will remain free from infection Outcome: Progressing   

## 2020-12-09 LAB — RENAL FUNCTION PANEL
Albumin: 2.4 g/dL — ABNORMAL LOW (ref 3.5–5.0)
Anion gap: 6 (ref 5–15)
BUN: 24 mg/dL — ABNORMAL HIGH (ref 8–23)
CO2: 23 mmol/L (ref 22–32)
Calcium: 8.6 mg/dL — ABNORMAL LOW (ref 8.9–10.3)
Chloride: 113 mmol/L — ABNORMAL HIGH (ref 98–111)
Creatinine, Ser: 0.8 mg/dL (ref 0.61–1.24)
GFR, Estimated: >60 mL/min (ref >60)
Glucose, Bld: 98 mg/dL (ref 70–99)
Phosphorus: 2.8 mg/dL (ref 2.5–4.6)
Potassium: 3.2 mmol/L — ABNORMAL LOW (ref 3.5–5.1)
Sodium: 142 mmol/L (ref 135–145)

## 2020-12-09 LAB — CBC
HCT: 24.5 % — ABNORMAL LOW (ref 39.0–52.0)
Hemoglobin: 8 g/dL — ABNORMAL LOW (ref 13.0–17.0)
MCH: 30.3 pg (ref 26.0–34.0)
MCHC: 32.7 g/dL (ref 30.0–36.0)
MCV: 92.8 fL (ref 80.0–100.0)
Platelets: 540 10*3/uL — ABNORMAL HIGH (ref 150–400)
RBC: 2.64 MIL/uL — ABNORMAL LOW (ref 4.22–5.81)
RDW: 20.3 % — ABNORMAL HIGH (ref 11.5–15.5)
WBC: 21.2 10*3/uL — ABNORMAL HIGH (ref 4.0–10.5)
nRBC: 0.2 % (ref 0.0–0.2)

## 2020-12-09 LAB — BPAM RBC
Blood Product Expiration Date: 202209102359
Blood Product Expiration Date: 202209262359
ISSUE DATE / TIME: 202209021434
ISSUE DATE / TIME: 202209031740
Unit Type and Rh: 6200
Unit Type and Rh: 6200

## 2020-12-09 LAB — TYPE AND SCREEN
ABO/RH(D): A POS
Antibody Screen: NEGATIVE
Unit division: 0
Unit division: 0

## 2020-12-09 MED ORDER — POTASSIUM CHLORIDE CRYS ER 20 MEQ PO TBCR
40.0000 meq | EXTENDED_RELEASE_TABLET | ORAL | Status: AC
  Administered 2020-12-09 (×2): 40 meq via ORAL
  Filled 2020-12-09 (×2): qty 2

## 2020-12-09 MED ORDER — HEPARIN (PORCINE) 25000 UT/250ML-% IV SOLN
2250.0000 [IU]/h | INTRAVENOUS | Status: DC
  Administered 2020-12-09: 2000 [IU]/h via INTRAVENOUS
  Administered 2020-12-10: 2250 [IU]/h via INTRAVENOUS
  Filled 2020-12-09 (×2): qty 250

## 2020-12-09 NOTE — Progress Notes (Signed)
ANTICOAGULATION CONSULT NOTE   Pharmacy Consult for heparin Indication: recent bilateral DVT on 8/17 (home xarelto on hold)  Allergies  Allergen Reactions   Ibuprofen Anaphylaxis   Shrimp [Shellfish Allergy] Anaphylaxis   Patient Measurements:  Height: 5\' 8"  (172.7 cm) Weight: 93 kg (205 lb 0.4 oz) IBW/kg (Calculated) : 68.4 Heparin Dosing Weight: 87.8 kg  Vital Signs: Temp: 97.2 F (36.2 C) (09/05 1320) BP: 136/74 (09/05 1320) Pulse Rate: 81 (09/05 1320)  Labs: Recent Labs    12/07/20 0454 12/08/20 0321 12/09/20 0327  HGB 7.2* 8.3* 8.0*  HCT 22.8* 26.2* 24.5*  PLT 422* 464* 540*  CREATININE 0.89 0.87 0.80   Estimated Creatinine Clearance: 95 mL/min (by C-G formula based on SCr of 0.8 mg/dL).  Medications:  - on xarelto dose pack PTA (on initial 15mg  BID regimen - last dose taken on 11/26/20 at 1800)  Assessment: Patient is a 70 y.o M with metastatic prostate cancer currently undergoing chemotherapy treatment and on xarelto PTA for bilateral DVTs that was diagnosed on 11/20/20, presented to the ED on 11/27/20 with c/o abdominal pain. Surgery and GI consulted for fecal impaction. Pharmacy has been consulted to transition patient to heparin drip in case invasive intervention is needed with this admission. Baseline aPTT 34 sec, heparin level is elevated at 0.91--> is elevated d/t effect of xarelto  Patient taken to OR on 8/25 for sigmoid colectomy and end colostomy placement due to concern for large bowel perforation in setting of fecal impaction. Heparin was held and resumed multiple times during admission. Most recently held on 9/2 for hemoglobin of 5.8.    - 8/28 am Heparin held for bleeding noted from nephrostomy tubes and colostomy - 8/31 Heparin held at 0514 am for new midline bleed - 9/1 OK to resume Heparin per CCS, no bolus - 9/2 Heparin held for hgb of 5.8. - 9/5 OK to resume heparin per TRH, no bolus  Today, 12/09/2020: Hemoglobin now stable 8's, Platelets  elevated but stable. Hematuria resolved per MD Heparin levels previously therapeutic (between 0.3-0.4) on 2000 units/hr  Goal of Therapy:  Heparin level 0.3-0.7 units/ml Monitor platelets by anticoagulation protocol: Yes   Plan:  Restart heparin infusion at 2000 units/hr Check 8 hour HL. Must request IV team consult to draw sample from line.  Daily CBC, heparin level  Monitor closely for s/sx of bleeding  F/u ability to transition back to [place], PharmD 12/09/2020 6:08 PM

## 2020-12-09 NOTE — Progress Notes (Signed)
Assessment & Plan: POD#11 - s/p ex lap, sigmoid colectomy, end colostomy - Dr. Kae Heller - 11/28/2020   Stercoral perforation with diffuse feculent peritonitis and extensive contamination of the peritoneal cavity - continue perc drain and IV abx - WBC 21K - slow improvement - dysphagia 3 diet - encourage PO pain meds over IV - stoma with slough but continues to function, stool in ostomy bag - BID wet-to-dry dressing changes to midline wound with Dakins solution - incentive spirometry, PT/OT - mobilize as able - surgical pathology confirms metastatic prostate cancer with positive LN  Dietician recommending tube feedings.  Goals of care need to be addressed by oncology, family, and palliative care.  Surgery will follow.   FEN: Soft diet, SLIV ID: keflex. Zosyn 8/25>> VTE: heparin gtt   Metastatic prostate cancer ABL anemia on anemia of chronic disease - hgb 7.6 from 7.8, monitor DVT, b/l LE         Armandina Gemma, MD       [facility]       Office: [phone]   Chief Complaint: Colonic perforation, metastatic carcinoma  Subjective: Patient somnolent this AM, awakens to stimulation.  No complaints.  Objective: Vital signs in last 24 hours: Temp:  [98.2 F (36.8 C)-99.1 F (37.3 C)] 99.1 F (37.3 C) (09/05 0617) Pulse Rate:  [85-93] 93 (09/05 0617) Resp:  [17-20] 18 (09/05 0617) BP: (121-157)/(73-77) 157/77 (09/05 0617) SpO2:  [99 %-100 %] 100 % (09/05 0617) Weight:  [93 kg] 93 kg (09/05 0500) Last BM Date: 12/07/20  Intake/Output from previous day: 09/04 0701 - 09/05 0700 In: 441.1 [P.O.:240; IV Piggyback:201.1] Out: 1930 [Urine:1675; Drains:5; Stool:250] Intake/Output this shift: No intake/output data recorded.  Physical Exam: HEENT - sclerae clear, mucous membranes moist Neck - soft Abdomen - mild distension; liquid stool in ostomy; some slough at ostomy; midline dressing intact; JP with thin brown fluid; minimal from right flank perc  drain   Lab Results:  Recent Labs    12/08/20 0321 12/09/20 0327  WBC 23.4* 21.2*  HGB 8.3* 8.0*  HCT 26.2* 24.5*  PLT 464* 540*   BMET Recent Labs    12/08/20 0321 12/09/20 0327  NA 138 142  K 3.6 3.2*  CL 111 113*  CO2 21* 23  GLUCOSE 90 98  BUN 24* 24*  CREATININE 0.87 0.80  CALCIUM 8.1* 8.6*   PT/INR No results for input(s): LABPROT, INR in the last 72 hours. Comprehensive Metabolic Panel:    Component Value Date/Time   NA 142 12/09/2020 0327   NA 138 12/08/2020 0321   NA 143 11/25/2020 1554   K 3.2 (L) 12/09/2020 0327   K 3.6 12/08/2020 0321   CL 113 (H) 12/09/2020 0327   CL 111 12/08/2020 0321   CO2 23 12/09/2020 0327   CO2 21 (L) 12/08/2020 0321   BUN 24 (H) 12/09/2020 0327   BUN 24 (H) 12/08/2020 0321   BUN 23 11/25/2020 1554   CREATININE 0.80 12/09/2020 0327   CREATININE 0.87 12/08/2020 0321   CREATININE 1.00 11/14/2020 1146   CREATININE 1.44 (H) 09/17/2020 1050   GLUCOSE 98 12/09/2020 0327   GLUCOSE 90 12/08/2020 0321   CALCIUM 8.6 (L) 12/09/2020 0327   CALCIUM 8.1 (L) 12/08/2020 0321   AST 33 12/04/2020 0327   AST 36 12/03/2020 0842   AST 35 11/14/2020 1146   AST 15 09/17/2020 1050   ALT 14 12/04/2020 0327   ALT 18 12/03/2020 0842   ALT 45 (H)  11/14/2020 1146   ALT 7 09/17/2020 1050   ALKPHOS 119 12/04/2020 0327   ALKPHOS 119 12/03/2020 0842   BILITOT 0.9 12/04/2020 0327   BILITOT 1.0 12/03/2020 0842   BILITOT 0.3 11/14/2020 1146   BILITOT 0.2 (L) 09/17/2020 1050   PROT 5.1 (L) 12/04/2020 0327   PROT 4.8 (L) 12/03/2020 0842   ALBUMIN 2.4 (L) 12/09/2020 0327   ALBUMIN 2.0 (L) 12/08/2020 0321    Studies/Results: No results found.    Armandina Gemma 12/09/2020   [name], male   DOB: 10-03-1950, 70 y.o.   MRN: [id number]

## 2020-12-09 NOTE — Progress Notes (Signed)
Occupational Therapy Treatment Patient Details Name: Andre Glass MRN: [id number] DOB: 09/29/1950 Today's Date: 12/09/2020    History of present illness The pt is a 70 yo male presenting on 8/24 and admitted with possible malignant stricture was progressive and intra-abdominal metastatic disease, extensive osseous metastatic disease, evidence of bilateral lower extremity DVT and pelvic adenopathy. patient was also found to have fecal impaction with GI bleed impacting DVT treament.patient underwent ex lap, sigmoid colectomy,and end colostomy on 8/25.  PMH: reccently admitted for obstructive uropathy with bilateral hydronephrosis, s/p placement of percutaneous nephrostomy tubes bilaterally,UTI metastatic adenocarinoma of prostate, chornic pain, HTN, and CKD.   OT comments  Patient was fatigued today but agreeable to try some movement. Patient's session was dovetailed with PT on this date. Patient was noted to have improved in self feeding tasks and movement of BUE. Patient continues to have decreased functional activity tolerance, decreased endurance, decreased strength, and increased pain with movement impacting ability to participate in ADLs. Patient recommended to use red foam on utensils to increase independence in self feeding. Nurse was educated on using red foam on this date.  Patient's discharge plan remains appropriate at this time. OT will continue to follow acutely.    Follow Up Recommendations  SNF    Equipment Recommendations  Other (comment) (defer to next venue)    Recommendations for Other Services      Precautions / Restrictions Precautions Precautions: Fall Precaution Comments: monitor O2,central line, soft spoken, nephrostomies bilateral, colostomy Restrictions Weight Bearing Restrictions: No       Mobility Bed Mobility Overal bed mobility: Needs Assistance Bed Mobility: Rolling Rolling: +2 for physical assistance;Max assist         General bed mobility comments:  patient was noted to have difficult time with maintaining sidelying without total assistance with patient unable to tolerate. patient was gradually positioned in chair position in bed and tilted for patient to participate in breakfast. patient was educated on importance of sitting up in position after finishing eating to follow SLP recommendations for swallow safety.    Transfers                      Balance                                           ADL either performed or assessed with clinical judgement   ADL     Eating/Feeding Details (indicate cue type and reason): patient was positioned in chair position in bed for self feeding with educaiton to slow down between bites. patient reported difficulty with holding typical spoon with red foam provided to build up utensil. patient was able to scoop and spear with red handle with supervision continued cues to slow down. patient reported increased pain in abdomen with sititng in chair position. Grooming: Dance movement psychotherapist;Bed level;Set up Grooming Details (indicate cue type and reason): with increased time and using BUE to complete task                                     Vision       Perception     Praxis      Cognition Arousal/Alertness: Awake/alert Behavior During Therapy: WFL for tasks assessed/performed Overall Cognitive Status: Difficult to assess  Exercises     Shoulder Instructions       General Comments      Pertinent Vitals/ Pain       Pain Assessment: Faces Faces Pain Scale: Hurts whole lot Pain Location: abdomen while sitting up in chair position in bed Pain Descriptors / Indicators: Discomfort;Grimacing Pain Intervention(s): Monitored during session;Repositioned;Other (comment) (let nurse know)  Home Living                                          Prior Functioning/Environment               Frequency  Min 2X/week        Progress Toward Goals  OT Goals(current goals can now be found in the care plan section)     Acute Rehab OT Goals Patient Stated Goal: to get stronger  Plan Discharge plan remains appropriate    Co-evaluation    PT/OT/SLP Co-Evaluation/Treatment: Yes Reason for Co-Treatment: Complexity of the patient's impairments (multi-system involvement);To address functional/ADL transfers PT goals addressed during session: Mobility/safety with mobility OT goals addressed during session: ADL's and self-care      AM-PAC OT "6 Clicks" Daily Activity     Outcome Measure   Help from another person eating meals?: A Little Help from another person taking care of personal grooming?: A Lot Help from another person toileting, which includes using toliet, bedpan, or urinal?: Total Help from another person bathing (including washing, rinsing, drying)?: Total Help from another person to put on and taking off regular upper body clothing?: A Lot Help from another person to put on and taking off regular lower body clothing?: Total 6 Click Score: 10    End of Session    OT Visit Diagnosis: Muscle weakness (generalized) (M62.81);Pain Pain - part of body:  (abdomen)   Activity Tolerance Patient limited by pain   Patient Left in bed;with call bell/phone within reach   Nurse Communication Other (comment) (educated on patients pain and foam for handles of utensils)        Time: 2774-1287 OT Time Calculation (min): 15 min  Charges: OT General Charges $OT Visit: 1 Visit OT Treatments $Self Care/Home Management : 8-22 mins  Jackelyn Poling OTR/L, [place] [facility] Department Office# [phone] Pager# [phone]    [place] 12/09/2020, 10:34 AM

## 2020-12-09 NOTE — Progress Notes (Signed)
Physical Therapy Treatment Patient Details Name: Andre Glass MRN: [id number] DOB: Dec 30, 1950 Today's Date: 12/09/2020    History of Present Illness The pt is a 70 yo male presenting on 8/24 and admitted with possible malignant stricture was progressive and intra-abdominal metastatic disease, extensive osseous metastatic disease, evidence of bilateral lower extremity DVT and pelvic adenopathy. patient was also found to have fecal impaction with GI bleed impacting DVT treament.patient underwent ex lap, sigmoid colectomy,and end colostomy on 8/25.  PMH: reccently admitted for obstructive uropathy with bilateral hydronephrosis, s/p placement of percutaneous nephrostomy tubes bilaterally,UTI metastatic adenocarinoma of prostate, chornic pain, HTN, and CKD.    PT Comments    Patient  remains extremely weak, d Requires multimodal cuase for rolling, having patient reach for bed rail to roll to right. Unable to assist sitting on bed edge due to weakness. Palced bed in  the bed/chair position, then was able to assist self with breakfast.   Patient did not report pain in the  ankles today when performed AAROM. Patient did report left knee pain when flexed  in prep to roll. Patient's voice almost inaudible but is stronger when he is sitting more upright. Continue mobility and strengthening.  Follow Up Recommendations  SNF     Equipment Recommendations  None recommended by PT    Recommendations for Other Services       Precautions / Restrictions Precautions Precautions: Fall Precaution Comments: monitor O2,central line, soft spoken, nephrostomies bilateral, colostomy    Mobility  Bed Mobility Overal bed mobility: Needs Assistance Bed Mobility: Rolling Rolling: +2 for physical assistance;Max assist         General bed mobility comments: patient was noted to have difficult time with maintaining sidelying without total assistance with patient unable to tolerate. patient was gradually positioned  in chair position in bed and tilted for patient to participate in breakfast. patient was educated on importance of sitting up in position after finishing eating to follow SLP recommendations for swallow safety.    Transfers                 General transfer comment: NT, will need lift  Ambulation/Gait                 Stairs             Wheelchair Mobility    Modified Rankin (Stroke Patients Only)       Balance                                            Cognition Arousal/Alertness: Awake/alert Behavior During Therapy: Flat affect Overall Cognitive Status: Difficult to assess                                 General Comments: Patient continues to have very low voice. Did follow some directions for reaching, feeding self      Exercises      General Comments        Pertinent Vitals/Pain Pain Assessment: Faces Faces Pain Scale: Hurts whole lot Pain Location: abdomen while sitting up in chair position in bed Pain Descriptors / Indicators: Discomfort;Grimacing Pain Intervention(s): Monitored during session;Repositioned;Other (comment)    Home Living  Prior Function            PT Goals (current goals can now be found in the care plan section) Acute Rehab PT Goals Patient Stated Goal: to get stronger Progress towards PT goals: Progressing toward goals    Frequency    Min 2X/week      PT Plan Current plan remains appropriate    Co-evaluation PT/OT/SLP Co-Evaluation/Treatment: Yes Reason for Co-Treatment: For patient/therapist safety;To address functional/ADL transfers PT goals addressed during session: Mobility/safety with mobility OT goals addressed during session: ADL's and self-care      AM-PAC PT "6 Clicks" Mobility   Outcome Measure  Help needed turning from your back to your side while in a flat bed without using bedrails?: Total Help needed moving from lying on  your back to sitting on the side of a flat bed without using bedrails?: Total Help needed moving to and from a bed to a chair (including a wheelchair)?: Total Help needed standing up from a chair using your arms (e.g., wheelchair or bedside chair)?: Total Help needed to walk in hospital room?: Total Help needed climbing 3-5 steps with a railing? : Total 6 Click Score: 6    End of Session Equipment Utilized During Treatment: Oxygen Activity Tolerance: Patient limited by fatigue;Patient limited by pain Patient left: in bed;with call bell/phone within reach;with bed alarm set Nurse Communication: Mobility status;Need for lift equipment PT Visit Diagnosis: Muscle weakness (generalized) (M62.81);Pain     Time: 5361-4431 PT Time Calculation (min) (ACUTE ONLY): 23 min  Charges:  $Therapeutic Activity: 8-22 mins                     Tresa Endo PT Acute Rehabilitation Services Pager [phone] Office [phone]    Claretha Cooper 12/09/2020, 1:59 PM

## 2020-12-09 NOTE — Progress Notes (Signed)
NUTRITION NOTE  Patient seen for full assessment by this RD on 9/1. At that time, recommended to wife, SLP, and MD that small bore NGT be placed and TF initiated. TF recommendations left in note on that date.  Order in place for Calorie Count. Patient on Dysphagia 3, thin liquids diet and ordered Boost Breeze once/day and 30 ml Prosource Plus BID. Most recently documented intake was 50% of lunch yesterday.  Will follow-up 9/6 with results of Calorie Count Day #1.      Jarome Matin, MS, RD, LDN, CNSC Inpatient Clinical Dietitian RD pager # available in [place]  After hours/weekend pager # available in [facility]

## 2020-12-09 NOTE — Progress Notes (Signed)
PROGRESS NOTE    Andre Glass  QZE:[id number] DOB: June 19, 1950 DOA: 11/27/2020 PCP: [name], DO  Brief Narrative:  The patient is a 70 year old African-American male with a past medical history significant for but not limited to metastatic prostate cancer with resultant obstructive uropathy requiring bilateral nephrostomy tube placement with other comorbidities who presented with worsening right-sided abdominal pain that started on the morning of yesterday.  The pain was found to be in the right lower quadrant and was a sharp 10 out of 10 in severity.  Patient dates that he passed gas yesterday and had several loose bowel movements but states nothing made the pain worse or better.  He recently had a CT scan on 11/20/2020 which showed possible malignant stricture was progressive and intra-abdominal metastatic disease and extensive osseous metastatic disease as well as evidence of bilateral lower extremity DVT and pelvic veins being compressed by pelvic adenopathy.  He had right peroneal DVT and acute left femoral DVT extending through the veins of the left lower leg and vascular surgery was consulted at that time and recommended no intervention discharged on anticoagulation with Xarelto.  He is currently on amoxicillin and Trulance without relief.  He was given fleets enema without relief in the ED.  He was also noted to have a hemoglobin of 6.6 down from 7.5 a few days ago.  He was transfused 1 unit PRBCs and general surgery as well as gastroenterology were consulted and they recommended bowel regimen and no surgical intervention unless the patient did not improve however subsequently after his enema today the patien likely perforated secondary to history of colon ulcer and had rigidity and guarding consistent with peritonitis.  General surgery is taking him to the OR for emergent surgery for a laparotomy, partial colectomy and colostomy and he is postoperative day 1 for exploratory laparotomy, sigmoid  colectomy and end colostomy with Dr. Yancey Flemings on 11/28/2020.  Findings intraoperatively showed a large perforation with diffuse feculent peritonitis and extensive contamination of the peritoneal cavity with stool and pus  General surgery feels that he is at very high risk for abscess formation and they are recommending following his labs closely and consider repeating a scan in next few days.  They are recommending continuing NG tube to low intermittent suction and await viable bowel function.  He does have some colostomy output.  Wound care has been consulted and he has been getting wet-to-dry dressings twice daily and pain control with IV acetaminophen.  His electrolytes and labs are been off so we will stop his potassium supplementation and change his fluid to D5W and give him a dose of Lokelma  Today his blood count dropped a little bit and he started having some blood from his nephrostomy in his colostomy so we will hold his heparin drip.  Surgery recommending continuing NG tube to low intermittent suction.  Continued to hold heparin drip given that his hemoglobin continues to drop and now that he has some blood in his nephrostomy in his colostomy.  However his hemoglobin has improved and his nephrostomy tubes had minimal blood in him so heparin drip was resumed.  He does have some blood-tinged urine now on his nephrostomies. Medical oncology evaluated and feel that his overall prognosis from process cancer General guarded with limited life expectancy.  Oncology feels that if he is able to obtain meaningful recovery that they will resume chemotherapy however the odds of this is happening is very small.  He pulled out his NG tube again and  it was not replaced. General Surgery feels that he is still at risk for intra-abdominal abscesses given his intraoperative findings and they are waiting for improvement in his distention before giving liquids or food from a p.o. standpoint.   Assessment & Plan:    Principal Problem:   Stercoral perforation of colon s/p Hartmann colectomy/colostomy 11/28/2020 Active Problems:   Metastatic castration-resistant adenocarcinoma of prostate (HCC)   BPH (benign prostatic hyperplasia)   Symptomatic anemia   Fecal impaction (HCC)   Hypokalemia   Hypomagnesemia   DVT of deep femoral vein, left (HCC)   Malnutrition of moderate degree   Fecal peritonitis (HCC)  Fecal impaction with acute on chronic constipation and possible stercoral ulcer with perforation resulting in peritonitis status post exploratory laparotomy, sigmoid colectomy and end colostomy by Dr. Windle Guard on 11/28/2020 laparotomies/ -Patient presents with a 3-day history of fecal impaction secondary to prostate cancer along with his home Dilaudid -He has tried senna and MiraLAX in the past with out relief -Patient takes Movantik and flecainide and he was given Colace and senna trial of tapwater enemas as well as lactulose enemas -GI was consulted and recommended enemas the way he takes them -He subsequently became extremely hypotensive and had some guarding and rigidity of his abdomen and became diaphoretic -Stat KUB showed possible free air -General surgery was reconsulted and took the patient to the OR emergently -He underwent exploratory laparotomy, sigmoid colectomy and end colostomy. -Wound care has been consulted -Patient's NG tube has been removed and will hold off for now given that he continues to put out nightly -Patient's surgical pathology is back and it showed metastatic prostate cancer in the nodes -General surgery thinks his ostomy is tenuous but is working they are recommending continue IV antibiotics with IV Zosyn and JP drain for now -Culture from JP drain growing Pseudomonas and E. coli -Palliative care has been consulted for goals of care discussion and appreciate their evaluation. Patient still wants full scope of care.  Metastatic prostate cancer with subsequent pain -Continue  with Tylenol, hydrocodone and Dilaudid -General surgery started him on IV acetaminophen -Medical oncology was consulted and they are recommending that if he has any meaningful recovery that they can resume chemotherapy but they feel this is small and they feel that his prognosis is guarded  Right Leg DVT -He was being treated with heparin infusion, but held in light of anemia/hematuria -Now that hemoglobin is stable and hematuria appears to have resolved, will restart IV heparin with no bolus -Restart on Xarelto when cleared by general surgery   Acute on chronic anemia likely of chronic disease, trending down further -Hemoglobin had initially declined to 6.6 and he was transfused 2 units PRBC. -Follow-up hemoglobin had improved -Subsequently, hemoglobin trended back down to 5.8, possibly from hematuria -He was transfused another 2 unit of PRBC -Follow-up hemoglobin improved.   Hematuria -Patient noted to have hematuria in nephrostomies -IV heparin placed on hold and overall urine appears to have cleared -We will try to restart heparin without bolus   Leukocytosis -WBC count currently trending down to 21.2 -CT abdomen repeated yesterday without any new findings -There was mention of evolving fluid collections, but no clear abscesses yet -He is currently on Zosyn ([id number]) and has been afebrile -Blood cultures have not shown any significant growth -Culture from JP drain shows Pseudomonas and E. coli -Culture from nephrostomy did not show any significant growth -We will discuss further antibiotics with infectious disease  Anasarca -Likely related to low protein  state in the setting of IV fluid hydration -Further IV fluids have been discontinued -He does have some pleural effusions and some ascites -Started on albumin infusions and low-dose Lasix with improvement of edema.  Moderate malnutrition -Seen by nutrition with recommendations to place small bore feeding tube until p.o.  intake has improved -Discussed with patient, and he is agreeable to this, but would like to ensure that this is truly indicated -Requested a calorie count for objective data   DVT prophylaxis: Anticoagulated with a heparin drip  Code Status: Full code Family Communication: Spoke with patient at bedside Disposition Plan: Pending further clinical improvement and clearance by specialists  Status is: Inpatient  Remains inpatient appropriate because:Unsafe d/c plan, IV treatments appropriate due to intensity of illness or inability to take PO, and Inpatient level of care appropriate due to severity of illness  Dispo: The patient is from: Home              Anticipated d/c is to: SNF              Patient currently is not medically stable to d/c.   Difficult to place patient No  Consultants:  General surgery Gastroenterology Palliative care medicine Urology Medical Oncology  Procedures: Emergent surgery: Procedure performed: Exploratory laparotomy, sigmoid colectomy with end colostomy  Antimicrobials: Anti-infectives (From admission, onward)    Start     Dose/Rate Route Frequency Ordered Stop   11/28/20 2230  piperacillin-tazobactam (ZOSYN) IVPB 3.375 g        3.375 g 12.5 mL/hr over 240 Minutes Intravenous Every 8 hours 11/28/20 2121     11/28/20 2215  piperacillin-tazobactam (ZOSYN) IVPB 3.375 g  Status:  Discontinued       Note to Pharmacy: Given Intraoperative   3.375 g 100 mL/hr over 30 Minutes Intravenous Every 8 hours 11/28/20 2117 11/28/20 2121   11/28/20 1730  piperacillin-tazobactam (ZOSYN) IVPB 3.375 g       Note to Pharmacy: Given Intraoperative   3.375 g 100 mL/hr over 30 Minutes Intravenous  Once 11/28/20 1729 11/28/20 1750   11/28/20 1712  piperacillin-tazobactam (ZOSYN) 3.375 GM/50ML IVPB       Note to Pharmacy: Marchia Meiers   : cabinet override      11/28/20 1712 11/28/20 1735   11/28/20 1545  ceFAZolin (ANCEF) IVPB 2g/100 mL premix        2 g 200 mL/hr  over 30 Minutes Intravenous  Once 11/28/20 1538 11/28/20 1630   11/28/20 1539  ceFAZolin (ANCEF) 2-4 GM/100ML-% IVPB       Note to Pharmacy: Minor, Anneita   : cabinet override      11/28/20 1539 11/28/20 1654   11/27/20 1700  cephALEXin (KEFLEX) capsule 500 mg  Status:  Discontinued        500 mg Oral 3 times daily 11/27/20 1640 11/28/20 2117        Subjective: Has some more abd pain today. No vomiting.   Objective: Vitals:   12/08/20 2105 12/09/20 0500 12/09/20 0617 12/09/20 1320  BP: 121/74  (!) 157/77 136/74  Pulse: 89  93 81  Resp: 17  18 20   Temp: 98.6 F (37 C)  99.1 F (37.3 C) (!) 97.2 F (36.2 C)  TempSrc:      SpO2: 99%  100% 100%  Weight:  93 kg    Height:        Intake/Output Summary (Last 24 hours) at 12/09/2020 1804 Last data filed at 12/09/2020 1703 Gross per 24 hour  Intake 701.12 ml  Output 3330 ml  Net -2628.88 ml   Filed Weights   12/04/20 0500 12/07/20 0552 12/09/20 0500  Weight: 93 kg 93.2 kg 93 kg   Examination: Physical Exam:  General exam: Alert, awake, oriented x 3 Respiratory system: Clear to auscultation. Respiratory effort normal. Cardiovascular system:RRR. No murmurs, rubs, gallops. Gastrointestinal system: Abdomen is nondistended, soft and nontender. No organomegaly or masses felt. Normal bowel sounds heard.  Ostomy left lower quadrant.  Bilateral nephrostomies with urine largely clearing of blood Central nervous system: Alert and oriented. No focal neurological deficits. Extremities: Anasarca is present Skin: No rashes, lesions or ulcers Psychiatry: Judgement and insight appear normal. Mood & affect appropriate.   Data Reviewed: I have personally reviewed following labs and imaging studies  CBC: Recent Labs  Lab 12/04/20 0327 12/05/20 0325 12/06/20 1039 12/06/20 2311 12/07/20 0454 12/08/20 0321 12/09/20 0327  WBC 26.6*   < > 32.3* 26.7* 25.2* 23.4* 21.2*  NEUTROABS 21.7*  --  31.3*  --   --   --   --   HGB 7.8*   < > 5.8*  7.2* 7.2* 8.3* 8.0*  HCT 25.0*   < > 18.7* 22.7* 22.8* 26.2* 24.5*  MCV 93.3   < > 96.9 94.6 94.6 92.6 92.8  PLT 272   < > 398 416* 422* 464* 540*   < > = values in this interval not displayed.   Basic Metabolic Panel: Recent Labs  Lab 12/03/20 0842 12/04/20 0327 12/05/20 0325 12/06/20 1039 12/07/20 0454 12/08/20 0321 12/09/20 0327  NA 136 142 140 139 139 138 142  K 2.9* 3.8 3.8 3.3* 3.4* 3.6 3.2*  CL 108 115* 114* 113* 114* 111 113*  CO2 19* 19* 19* 18* 22 21* 23  GLUCOSE 85 116* 78 98 95 90 98  BUN 22 14 13 21  25* 24* 24*  CREATININE 0.82 0.81 0.77 0.85 0.89 0.87 0.80  CALCIUM 7.4* 7.6* 7.7* 7.8* 8.1* 8.1* 8.6*  MG 2.0 2.0  --   --   --  1.8  --   PHOS 2.4* 2.3* 2.2* 3.4 2.7 3.3 2.8   GFR: Estimated Creatinine Clearance: 95 mL/min (by C-G formula based on SCr of 0.8 mg/dL). Liver Function Tests: Recent Labs  Lab 12/03/20 0842 12/04/20 0327 12/05/20 0325 12/06/20 1039 12/07/20 0454 12/08/20 0321 12/09/20 0327  AST 36 33  --   --   --   --   --   ALT 18 14  --   --   --   --   --   ALKPHOS 119 119  --   --   --   --   --   BILITOT 1.0 0.9  --   --   --   --   --   PROT 4.8* 5.1*  --   --   --   --   --   ALBUMIN 1.5* 1.4* 1.3* 2.3* 2.2* 2.0* 2.4*   No results for input(s): LIPASE, AMYLASE in the last 168 hours. No results for input(s): AMMONIA in the last 168 hours. Coagulation Profile: No results for input(s): INR, PROTIME in the last 168 hours. Cardiac Enzymes: No results for input(s): CKTOTAL, CKMB, CKMBINDEX, TROPONINI in the last 168 hours. BNP (last 3 results) No results for input(s): PROBNP in the last 8760 hours. HbA1C: No results for input(s): HGBA1C in the last 72 hours. CBG: No results for input(s): GLUCAP in the last 168 hours. Lipid Profile: No results for input(s): CHOL,  HDL, LDLCALC, TRIG, CHOLHDL, LDLDIRECT in the last 72 hours. Thyroid Function Tests: No results for input(s): TSH, T4TOTAL, FREET4, T3FREE, THYROIDAB in the last 72  hours. Anemia Panel: No results for input(s): VITAMINB12, FOLATE, FERRITIN, TIBC, IRON, RETICCTPCT in the last 72 hours. Sepsis Labs: No results for input(s): PROCALCITON, LATICACIDVEN in the last 168 hours.  Recent Results (from the past 240 hour(s))  Culture, blood (routine x 2)     Status: Abnormal (Preliminary result)   Collection Time: 12/05/20  1:51 PM   Specimen: BLOOD  Result Value Ref Range Status   Specimen Description   Final    BLOOD RIGHT ANTECUBITAL Performed at [facility] [address], [place], [place] [address]    Special Requests   Final    BOTTLES DRAWN AEROBIC AND ANAEROBIC Blood Culture adequate volume Performed at [facility] [address] [place], [place] [address]    Culture  Setup Time (A)  Final    GRAM VARIABLE ROD ANAEROBIC BOTTLE ONLY CRITICAL RESULT CALLED TO, READ BACK BY AND VERIFIED WITH: L POINDEXTER,[email or web address]  12/08/20 [place]    Culture (A)  Final    GRAM VARIABLE ROD CULTURE REINCUBATED FOR BETTER GROWTH Performed at [facility], [place] [address], [place], [place] [address]    Report Status PENDING  Incomplete  Blood Culture ID Panel (Reflexed)     Status: None   Collection Time: 12/05/20  1:51 PM  Result Value Ref Range Status   Enterococcus faecalis NOT DETECTED NOT DETECTED Final   Enterococcus Faecium NOT DETECTED NOT DETECTED Final   Listeria monocytogenes NOT DETECTED NOT DETECTED Final   Staphylococcus species NOT DETECTED NOT DETECTED Final   Staphylococcus aureus (BCID) NOT DETECTED NOT DETECTED Final   Staphylococcus epidermidis NOT DETECTED NOT DETECTED Final   Staphylococcus lugdunensis NOT DETECTED NOT DETECTED Final   Streptococcus species NOT DETECTED NOT DETECTED Final   Streptococcus agalactiae NOT DETECTED NOT DETECTED Final   Streptococcus pneumoniae NOT DETECTED NOT DETECTED Final   Streptococcus pyogenes NOT DETECTED NOT DETECTED Final   A.calcoaceticus-baumannii NOT  DETECTED NOT DETECTED Final   Bacteroides fragilis NOT DETECTED NOT DETECTED Final   Enterobacterales NOT DETECTED NOT DETECTED Final   Enterobacter cloacae complex NOT DETECTED NOT DETECTED Final   Escherichia coli NOT DETECTED NOT DETECTED Final   Klebsiella aerogenes NOT DETECTED NOT DETECTED Final   Klebsiella oxytoca NOT DETECTED NOT DETECTED Final   Klebsiella pneumoniae NOT DETECTED NOT DETECTED Final   Proteus species NOT DETECTED NOT DETECTED Final   Salmonella species NOT DETECTED NOT DETECTED Final   Serratia marcescens NOT DETECTED NOT DETECTED Final   Haemophilus influenzae NOT DETECTED NOT DETECTED Final   Neisseria meningitidis NOT DETECTED NOT DETECTED Final   Pseudomonas aeruginosa NOT DETECTED NOT DETECTED Final   Stenotrophomonas maltophilia NOT DETECTED NOT DETECTED Final   Candida albicans NOT DETECTED NOT DETECTED Final   Candida auris NOT DETECTED NOT DETECTED Final   Candida glabrata NOT DETECTED NOT DETECTED Final   Candida krusei NOT DETECTED NOT DETECTED Final   Candida parapsilosis NOT DETECTED NOT DETECTED Final   Candida tropicalis NOT DETECTED NOT DETECTED Final   Cryptococcus neoformans/gattii NOT DETECTED NOT DETECTED Final    Comment: Performed at [facility], [address], [place],  [address]  Culture, blood (routine x 2)     Status: None (Preliminary result)   Collection Time: 12/05/20  1:59 PM   Specimen: BLOOD RIGHT FOREARM  Result  Value Ref Range Status   Specimen Description   Final    BLOOD RIGHT FOREARM Performed at [facility] [address], [place], [place] [address]    Special Requests   Final    BOTTLES DRAWN AEROBIC AND ANAEROBIC Blood Culture adequate volume Performed at [facility] [address] [place], [place] [address]    Culture   Final    NO GROWTH 4 DAYS Performed at [facility], [place] [address] [place], [place] [address]    Report Status PENDING   Incomplete  Urine Culture     Status: Abnormal   Collection Time: 12/05/20  3:52 PM   Specimen: Urine, Random  Result Value Ref Range Status   Specimen Description   Final    URINE, RANDOM NEPHROSTOMY Performed at [facility] [address], [place], [place] [address]    Special Requests   Final    NONE Performed at [facility], [place] [address], [place], [place] [address]    Culture 10,000 COLONIES/mL YEAST (A)  Final   Report Status 12/07/2020 FINAL  Final  Aerobic Culture w Gram Stain (superficial specimen)     Status: None   Collection Time: 12/05/20  4:25 PM   Specimen: JP Drain  Result Value Ref Range Status   Specimen Description   Final    JP DRAINAGE Performed at [facility] [address] [place], [place] [address]    Special Requests   Final    NONE Performed at [facility], [place] [address], [place], [place] [address]    Gram Stain   Final    NO SQUAMOUS EPITHELIAL CELLS PRESENT ABUNDANT WBC PRESENT,BOTH PMN AND MONONUCLEAR ABUNDANT GRAM NEGATIVE RODS Performed at [facility], [place] [address], [place], [place] [address]    Culture   Final    ABUNDANT ESCHERICHIA COLI MODERATE PSEUDOMONAS AERUGINOSA    Report Status 12/08/2020 FINAL  Final   Organism ID, Bacteria ESCHERICHIA COLI  Final   Organism ID, Bacteria PSEUDOMONAS AERUGINOSA  Final      Susceptibility   Escherichia coli - MIC*    AMPICILLIN >=32 RESISTANT Resistant     CEFAZOLIN <=4 SENSITIVE Sensitive     CEFEPIME <=0.12 SENSITIVE Sensitive     CEFTAZIDIME <=1 SENSITIVE Sensitive     CEFTRIAXONE <=0.25 SENSITIVE Sensitive     CIPROFLOXACIN <=0.25 SENSITIVE Sensitive     GENTAMICIN <=1 SENSITIVE Sensitive     IMIPENEM <=0.25 SENSITIVE Sensitive     TRIMETH/SULFA >=320 RESISTANT Resistant     AMPICILLIN/SULBACTAM >=32 RESISTANT Resistant     PIP/TAZO <=4 SENSITIVE Sensitive     * ABUNDANT ESCHERICHIA COLI    Pseudomonas aeruginosa - MIC*    CEFTAZIDIME 4 SENSITIVE Sensitive     CIPROFLOXACIN <=0.25 SENSITIVE Sensitive     GENTAMICIN <=1 SENSITIVE Sensitive     IMIPENEM 2 SENSITIVE Sensitive     PIP/TAZO <=4 SENSITIVE Sensitive     CEFEPIME 2 SENSITIVE Sensitive     * MODERATE PSEUDOMONAS AERUGINOSA      RN Pressure Injury Documentation:     Estimated body mass index is 31.17 kg/m as calculated from the following:   Height as of this encounter: 5\' 8"  (1.727 m).   Weight as of this encounter: 93 kg.  Malnutrition Type:  Nutrition Problem: Moderate Malnutrition Etiology: chronic illness, cancer and cancer related treatments  Malnutrition Characteristics:  Signs/Symptoms: mild fat depletion, mild  muscle depletion, moderate muscle depletion  Nutrition Interventions:  Interventions: Prostat, Boost Breeze, MVI   Radiology Studies: No results found.  Scheduled Meds:  (feeding supplement) PROSource Plus  30 mL Oral BID BM   acetaminophen (TYLENOL) oral liquid 160 mg/5 mL  650 mg Oral Q6H   vitamin C  1,000 mg Oral Daily   chlorhexidine  15 mL Mouth Rinse BID   Chlorhexidine Gluconate Cloth  6 each Topical Daily   feeding supplement  1 Container Oral Q24H   furosemide  20 mg Intravenous BID   gabapentin  200 mg Oral BID   mouth rinse  15 mL Mouth Rinse q12n4p   methocarbamol  1,000 mg Oral TID   mirtazapine  7.5 mg Oral QHS   multivitamin with minerals  1 tablet Oral Daily   pantoprazole  40 mg Oral Daily   predniSONE  5 mg Oral Q breakfast   Continuous Infusions:  piperacillin-tazobactam (ZOSYN)  IV 3.375 g (12/09/20 1402)    LOS: 12 days   Kathie Dike, MD [facility] PAGER is on [place]  If 7PM-7AM, please contact night-coverage [email or web address]

## 2020-12-09 NOTE — Plan of Care (Signed)
  Problem: Clinical Measurements: Goal: Will remain free from infection Outcome: Progressing Goal: Diagnostic test results will improve Outcome: Progressing   

## 2020-12-10 LAB — RENAL FUNCTION PANEL
Albumin: 2.1 g/dL — ABNORMAL LOW (ref 3.5–5.0)
Anion gap: 8 (ref 5–15)
BUN: 25 mg/dL — ABNORMAL HIGH (ref 8–23)
CO2: 22 mmol/L (ref 22–32)
Calcium: 8.5 mg/dL — ABNORMAL LOW (ref 8.9–10.3)
Chloride: 110 mmol/L (ref 98–111)
Creatinine, Ser: 0.78 mg/dL (ref 0.61–1.24)
GFR, Estimated: >60 mL/min (ref >60)
Glucose, Bld: 96 mg/dL (ref 70–99)
Phosphorus: 2.9 mg/dL (ref 2.5–4.6)
Potassium: 3.8 mmol/L (ref 3.5–5.1)
Sodium: 140 mmol/L (ref 135–145)

## 2020-12-10 LAB — MAGNESIUM: Magnesium: 1.8 mg/dL (ref 1.7–2.4)

## 2020-12-10 LAB — CULTURE, BLOOD (ROUTINE X 2)
Culture: NO GROWTH
Special Requests: ADEQUATE

## 2020-12-10 LAB — CBC
HCT: 26.4 % — ABNORMAL LOW (ref 39.0–52.0)
Hemoglobin: 8.2 g/dL — ABNORMAL LOW (ref 13.0–17.0)
MCH: 30 pg (ref 26.0–34.0)
MCHC: 31.1 g/dL (ref 30.0–36.0)
MCV: 96.7 fL (ref 80.0–100.0)
Platelets: 569 10*3/uL — ABNORMAL HIGH (ref 150–400)
RBC: 2.73 MIL/uL — ABNORMAL LOW (ref 4.22–5.81)
RDW: 21.1 % — ABNORMAL HIGH (ref 11.5–15.5)
WBC: 19.6 10*3/uL — ABNORMAL HIGH (ref 4.0–10.5)
nRBC: 0 % (ref 0.0–0.2)

## 2020-12-10 LAB — HEPARIN LEVEL (UNFRACTIONATED)
Heparin Unfractionated: 0.13 IU/mL — ABNORMAL LOW (ref 0.30–0.70)
Heparin Unfractionated: 0.76 IU/mL — ABNORMAL HIGH (ref 0.30–0.70)
Heparin Unfractionated: 0.88 IU/mL — ABNORMAL HIGH (ref 0.30–0.70)

## 2020-12-10 MED ORDER — MAGNESIUM SULFATE 2 GM/50ML IV SOLN
2.0000 g | Freq: Once | INTRAVENOUS | Status: AC
  Administered 2020-12-10: 2 g via INTRAVENOUS
  Filled 2020-12-10: qty 50

## 2020-12-10 MED ORDER — POTASSIUM CHLORIDE CRYS ER 20 MEQ PO TBCR
40.0000 meq | EXTENDED_RELEASE_TABLET | ORAL | Status: AC
Start: 2020-12-10 — End: 2020-12-10
  Administered 2020-12-10 (×2): 40 meq via ORAL
  Filled 2020-12-10 (×2): qty 2

## 2020-12-10 MED ORDER — HYDROMORPHONE HCL 1 MG/ML IJ SOLN
0.5000 mg | INTRAMUSCULAR | Status: DC | PRN
  Administered 2020-12-11: 1 mg via INTRAVENOUS
  Filled 2020-12-10: qty 1

## 2020-12-10 MED ORDER — HEPARIN (PORCINE) 25000 UT/250ML-% IV SOLN
1600.0000 [IU]/h | INTRAVENOUS | Status: DC
  Filled 2020-12-10 (×3): qty 250

## 2020-12-10 NOTE — Progress Notes (Signed)
ANTICOAGULATION CONSULT NOTE   Pharmacy Consult for heparin Indication: recent bilateral DVT on 8/17 (home xarelto on hold)  Allergies  Allergen Reactions   Ibuprofen Anaphylaxis   Shrimp [Shellfish Allergy] Anaphylaxis   Patient Measurements:  Height: 5\' 8"  (172.7 cm) Weight: 93 kg (205 lb 0.4 oz) IBW/kg (Calculated) : 68.4 Heparin Dosing Weight: 87.8 kg  Vital Signs: Temp: 98.3 F (36.8 C) (09/05 2047) Temp Source: Oral (09/05 2047) BP: 125/73 (09/05 2047) Pulse Rate: 80 (09/05 2047)  Labs: Recent Labs    12/08/20 0321 12/09/20 0327 12/10/20 0253  HGB 8.3* 8.0* 8.2*  HCT 26.2* 24.5* 26.4*  PLT 464* 540* 569*  HEPARINUNFRC  --   --  0.13*  CREATININE 0.87 0.80 0.78   Estimated Creatinine Clearance: 95 mL/min (by C-G formula based on SCr of 0.78 mg/dL).  Medications:  - on xarelto dose pack PTA (on initial 15mg  BID regimen - last dose taken on 11/26/20 at 1800)  Assessment: Patient is a 70 y.o M with metastatic prostate cancer currently undergoing chemotherapy treatment and on xarelto PTA for bilateral DVTs that was diagnosed on 11/20/20, presented to the ED on 11/27/20 with c/o abdominal pain. Surgery and GI consulted for fecal impaction. Pharmacy has been consulted to transition patient to heparin drip in case invasive intervention is needed with this admission. Baseline aPTT 34 sec, heparin level is elevated at 0.91--> is elevated d/t effect of xarelto  Patient taken to OR on 8/25 for sigmoid colectomy and end colostomy placement due to concern for large bowel perforation in setting of fecal impaction. Heparin was held and resumed multiple times during admission. Most recently held on 9/2 for hemoglobin of 5.8.    - 8/28 am Heparin held for bleeding noted from nephrostomy tubes and colostomy - 8/31 Heparin held at 0514 am for new midline bleed - 9/1 OK to resume Heparin per CCS, no bolus - 9/2 Heparin held for hgb of 5.8. - 9/5 OK to resume heparin per TRH, no  bolus  Today, 12/10/2020: HL 0.13 subtherapeutic on 2000 units/hr Hemoglobin now stable 8's, Platelets elevated but stable. Hematuria resolved per MD Per RN no line interruptions and no bleeding  Goal of Therapy:  Heparin level 0.3-0.7 units/ml Monitor platelets by anticoagulation protocol: Yes   Plan:  Increase heparin to 2250 units/hr Check 8 hour HL. Must request IV team consult to draw sample from line.  Daily CBC, heparin level  Monitor closely for s/sx of bleeding  F/u ability to transition back to [place] 12/10/2020, 3:48 AM

## 2020-12-10 NOTE — Care Management Important Message (Signed)
Important Message  Patient Details IM Letter given to the Patient. Name: Andre Glass MRN: [id number] Date of Birth: [birth year]   Medicare Important Message Given:  Yes     Kerin Salen 12/10/2020, 1:04 PM

## 2020-12-10 NOTE — Progress Notes (Signed)
PROGRESS NOTE    Andre Glass  QMV:[id number] DOB: [birth year] DOA: 11/27/2020 PCP: [name], DO  Brief Narrative:  70 year old male with a history of metastatic prostate cancer, chronic bilateral nephrostomies due to obstructive uropathy from underlying cancer, was admitted to the hospital with right-sided abdominal pain.  Initial CT scan performed did not show any specific obstructive pathology in the bowel.  He was initially seen by GI and general surgery they recommended continue bowel regimen and continued observation.  CT also showed bilateral DVTs and he was started on anticoagulation.  The patient was receiving enemas and unfortunately developed perforation of colon with peritonitis.  He was emergently taken to the operating room and underwent laparotomy, partial colectomy and colostomy.  Biopsies from surgery confirmed metastatic prostate cancer.  He has a JP drain in place and there is concern for underlying abscess.  He is currently on IV antibiotics.  Postoperative recovery has been slow due to his multiple medical issues.  He was seen by both oncology and palliative care.  Patient wishes to continue with current treatments with hopes for improvement.  Assessment & Plan:   Principal Problem:   Stercoral perforation of colon s/p Hartmann colectomy/colostomy 11/28/2020 Active Problems:   Metastatic castration-resistant adenocarcinoma of prostate (HCC)   BPH (benign prostatic hyperplasia)   Symptomatic anemia   Fecal impaction (HCC)   Hypokalemia   Hypomagnesemia   DVT of deep femoral vein, left (HCC)   Malnutrition of moderate degree   Fecal peritonitis (HCC)  Fecal impaction with acute on chronic constipation and possible stercoral ulcer with perforation resulting in peritonitis status post exploratory laparotomy, sigmoid colectomy and end colostomy by Dr. Windle Guard on 11/28/2020 laparotomies/ -Patient presents with a 3-day history of fecal impaction secondary to prostate cancer  along with his home Dilaudid -He has tried senna and MiraLAX in the past with out relief -Patient takes Movantik and flecainide and he was given Colace and senna trial of tapwater enemas as well as lactulose enemas -GI was consulted and recommended enemas the way he takes them -He subsequently became extremely hypotensive and had some guarding and rigidity of his abdomen and became diaphoretic -Stat KUB showed possible free air -General surgery was reconsulted and took the patient to the OR emergently -He underwent exploratory laparotomy, sigmoid colectomy and end colostomy. -Patient's surgical pathology is back and it showed metastatic prostate cancer in the nodes -General surgery thinks his ostomy is tenuous but is working, they are recommending continue IV antibiotics with IV Zosyn and JP drain for now -Culture from JP drain growing Pseudomonas and E. coli -Palliative care has been consulted for goals of care discussion and appreciate their evaluation. Patient still wants full scope of care.  Metastatic prostate cancer with subsequent pain -Continue with Tylenol, hydrocodone and Dilaudid -General surgery started him on IV acetaminophen -Medical oncology was consulted and they are recommending that if he has any meaningful recovery that they can resume chemotherapy but they feel this is small and they feel that his prognosis is guarded  Right Leg DVT -currently on Iv heparin -Restart on Xarelto when cleared by general surgery   Acute on chronic anemia likely of chronic disease, trending down further -Hemoglobin had initially declined to 6.6 and he was transfused 2 units PRBC. -Follow-up hemoglobin had improved -Subsequently, hemoglobin trended back down to 5.8, possibly from hematuria -He was transfused another 2 unit of PRBC -Follow-up hemoglobin improved.   Hematuria -Patient noted to have hematuria in nephrostomies -IV heparin placed on  hold and overall urine appears to have  cleared -started on Iv heparin -if hgb stable over next 24-48 hours, consider transition to xarelto   Leukocytosis -WBC count currently trending down from 32.3 to 19.6 -CT abdomen repeated on 8/31 without any new findings -There was mention of evolving fluid collections, but no clear abscesses yet -He is currently on Zosyn (IFO27) and has been afebrile -Blood cultures have not shown any significant growth -Culture from JP drain shows Pseudomonas and E. coli -Culture from nephrostomy did not show any significant growth -continue IV antibiotics per surgery -considering repeat CT abd in next few days since drain output has been decreasing  Anasarca -Likely related to low protein state in the setting of IV fluid hydration -Further IV fluids have been discontinued -He does have some pleural effusions and some ascites -Started on albumin infusions and low-dose Lasix with improvement of edema.  Moderate malnutrition -Seen by nutrition with recommendations to place small bore feeding tube until p.o. intake has improved -Discussed with patient, and he is agreeable to this, but would like to ensure that this is truly indicated -Requested a calorie count for objective data -currently on dysphagia 3 diet   DVT prophylaxis: Anticoagulated with a heparin drip  Code Status: Full code Family Communication: Spoke with patient at bedside Disposition Plan: Pending further clinical improvement and clearance by specialists  Status is: Inpatient  Remains inpatient appropriate because:Unsafe d/c plan, IV treatments appropriate due to intensity of illness or inability to take PO, and Inpatient level of care appropriate due to severity of illness  Dispo: The patient is from: Home              Anticipated d/c is to: SNF              Patient currently is not medically stable to d/c.   Difficult to place patient No  Consultants:  General surgery Gastroenterology Palliative care  medicine Urology Medical Oncology  Procedures: Emergent surgery: Procedure performed: Exploratory laparotomy, sigmoid colectomy with end colostomy  Antimicrobials: Anti-infectives (From admission, onward)    Start     Dose/Rate Route Frequency Ordered Stop   11/28/20 2230  piperacillin-tazobactam (ZOSYN) IVPB 3.375 g        3.375 g 12.5 mL/hr over 240 Minutes Intravenous Every 8 hours 11/28/20 2121     11/28/20 2215  piperacillin-tazobactam (ZOSYN) IVPB 3.375 g  Status:  Discontinued       Note to Pharmacy: Given Intraoperative   3.375 g 100 mL/hr over 30 Minutes Intravenous Every 8 hours 11/28/20 2117 11/28/20 2121   11/28/20 1730  piperacillin-tazobactam (ZOSYN) IVPB 3.375 g       Note to Pharmacy: Given Intraoperative   3.375 g 100 mL/hr over 30 Minutes Intravenous  Once 11/28/20 1729 11/28/20 1750   11/28/20 1712  piperacillin-tazobactam (ZOSYN) 3.375 GM/50ML IVPB       Note to Pharmacy: Marchia Meiers   : cabinet override      11/28/20 1712 11/28/20 1735   11/28/20 1545  ceFAZolin (ANCEF) IVPB 2g/100 mL premix        2 g 200 mL/hr over 30 Minutes Intravenous  Once 11/28/20 1538 11/28/20 1630   11/28/20 1539  ceFAZolin (ANCEF) 2-4 GM/100ML-% IVPB       Note to Pharmacy: Minor, Anneita   : cabinet override      11/28/20 1539 11/28/20 1654   11/27/20 1700  cephALEXin (KEFLEX) capsule 500 mg  Status:  Discontinued  500 mg Oral 3 times daily 11/27/20 1640 11/28/20 2117        Subjective: No vomiting. Having output from ostomy. No shortness of breath  Objective: Vitals:   12/10/20 0517 12/10/20 0810 12/10/20 1232 12/10/20 2116  BP: 140/76 137/76 129/69 (!) 151/72  Pulse: 81 83 89 85  Resp: 16 18 16 16   Temp: (!) 97.5 F (36.4 C) 98.1 F (36.7 C) 98.1 F (36.7 C) 98.1 F (36.7 C)  TempSrc: Oral Oral  Oral  SpO2: 100% 100%  97%  Weight:      Height:        Intake/Output Summary (Last 24 hours) at 12/10/2020 2135 Last data filed at 12/10/2020 1900 Gross per 24  hour  Intake 2417 ml  Output 2750 ml  Net -333 ml   Filed Weights   12/07/20 0552 12/09/20 0500 12/10/20 0433  Weight: 93.2 kg 93 kg 86.6 kg   Examination: Physical Exam:  General exam: Alert, awake, oriented x 3 Respiratory system: Clear to auscultation. Respiratory effort normal. Cardiovascular system:RRR. No murmurs, rubs, gallops. Gastrointestinal system: Abdomen is nondistended, soft and nontender. No organomegaly or masses felt. Normal bowel sounds heard. Colostomy in LLQ, drain in RLQ, bilateral nephrostomies with clear urine Central nervous system: Alert and oriented. No focal neurological deficits. Extremities: b/l LE edema Skin: No rashes, lesions or ulcers Psychiatry: Judgement and insight appear normal. Mood & affect appropriate.    Data Reviewed: I have personally reviewed following labs and imaging studies  CBC: Recent Labs  Lab 12/04/20 0327 12/05/20 0325 12/06/20 1039 12/06/20 2311 12/07/20 0454 12/08/20 0321 12/09/20 0327 12/10/20 0253  WBC 26.6*   < > 32.3* 26.7* 25.2* 23.4* 21.2* 19.6*  NEUTROABS 21.7*  --  31.3*  --   --   --   --   --   HGB 7.8*   < > 5.8* 7.2* 7.2* 8.3* 8.0* 8.2*  HCT 25.0*   < > 18.7* 22.7* 22.8* 26.2* 24.5* 26.4*  MCV 93.3   < > 96.9 94.6 94.6 92.6 92.8 96.7  PLT 272   < > 398 416* 422* 464* 540* 569*   < > = values in this interval not displayed.   Basic Metabolic Panel: Recent Labs  Lab 12/04/20 0327 12/05/20 0325 12/06/20 1039 12/07/20 0454 12/08/20 0321 12/09/20 0327 12/10/20 0253  NA 142   < > 139 139 138 142 140  K 3.8   < > 3.3* 3.4* 3.6 3.2* 3.8  CL 115*   < > 113* 114* 111 113* 110  CO2 19*   < > 18* 22 21* 23 22  GLUCOSE 116*   < > 98 95 90 98 96  BUN 14   < > 21 25* 24* 24* 25*  CREATININE 0.81   < > 0.85 0.89 0.87 0.80 0.78  CALCIUM 7.6*   < > 7.8* 8.1* 8.1* 8.6* 8.5*  MG 2.0  --   --   --  1.8  --  1.8  PHOS 2.3*   < > 3.4 2.7 3.3 2.8 2.9   < > = values in this interval not displayed.    GFR: Estimated Creatinine Clearance: 92 mL/min (by C-G formula based on SCr of 0.78 mg/dL). Liver Function Tests: Recent Labs  Lab 12/04/20 0327 12/05/20 0325 12/06/20 1039 12/07/20 0454 12/08/20 0321 12/09/20 0327 12/10/20 0253  AST 33  --   --   --   --   --   --   ALT 14  --   --   --   --   --   --  ALKPHOS 119  --   --   --   --   --   --   BILITOT 0.9  --   --   --   --   --   --   PROT 5.1*  --   --   --   --   --   --   ALBUMIN 1.4*   < > 2.3* 2.2* 2.0* 2.4* 2.1*   < > = values in this interval not displayed.   No results for input(s): LIPASE, AMYLASE in the last 168 hours. No results for input(s): AMMONIA in the last 168 hours. Coagulation Profile: No results for input(s): INR, PROTIME in the last 168 hours. Cardiac Enzymes: No results for input(s): CKTOTAL, CKMB, CKMBINDEX, TROPONINI in the last 168 hours. BNP (last 3 results) No results for input(s): PROBNP in the last 8760 hours. HbA1C: No results for input(s): HGBA1C in the last 72 hours. CBG: No results for input(s): GLUCAP in the last 168 hours. Lipid Profile: No results for input(s): CHOL, HDL, LDLCALC, TRIG, CHOLHDL, LDLDIRECT in the last 72 hours. Thyroid Function Tests: No results for input(s): TSH, T4TOTAL, FREET4, T3FREE, THYROIDAB in the last 72 hours. Anemia Panel: No results for input(s): VITAMINB12, FOLATE, FERRITIN, TIBC, IRON, RETICCTPCT in the last 72 hours. Sepsis Labs: No results for input(s): PROCALCITON, LATICACIDVEN in the last 168 hours.  Recent Results (from the past 240 hour(s))  Culture, blood (routine x 2)     Status: Abnormal (Preliminary result)   Collection Time: 12/05/20  1:51 PM   Specimen: BLOOD  Result Value Ref Range Status   Specimen Description   Final    BLOOD RIGHT ANTECUBITAL Performed at [facility] [address] [place], [place] [address]    Special Requests   Final    BOTTLES DRAWN AEROBIC AND ANAEROBIC Blood Culture adequate  volume Performed at [facility] [address], [place], [place] [address]    Culture  Setup Time (A)  Final    GRAM VARIABLE ROD ANAEROBIC BOTTLE ONLY CRITICAL RESULT CALLED TO, READ BACK BY AND VERIFIED WITH: L POINDEXTER,[email or web address]  12/08/20 [place]    Culture (A)  Final    GRAM VARIABLE ROD CULTURE REINCUBATED FOR BETTER GROWTH Performed at [facility], [place] [address] [place], [place] [address]    Report Status PENDING  Incomplete  Blood Culture ID Panel (Reflexed)     Status: None   Collection Time: 12/05/20  1:51 PM  Result Value Ref Range Status   Enterococcus faecalis NOT DETECTED NOT DETECTED Final   Enterococcus Faecium NOT DETECTED NOT DETECTED Final   Listeria monocytogenes NOT DETECTED NOT DETECTED Final   Staphylococcus species NOT DETECTED NOT DETECTED Final   Staphylococcus aureus (BCID) NOT DETECTED NOT DETECTED Final   Staphylococcus epidermidis NOT DETECTED NOT DETECTED Final   Staphylococcus lugdunensis NOT DETECTED NOT DETECTED Final   Streptococcus species NOT DETECTED NOT DETECTED Final   Streptococcus agalactiae NOT DETECTED NOT DETECTED Final   Streptococcus pneumoniae NOT DETECTED NOT DETECTED Final   Streptococcus pyogenes NOT DETECTED NOT DETECTED Final   A.calcoaceticus-baumannii NOT DETECTED NOT DETECTED Final   Bacteroides fragilis NOT DETECTED NOT DETECTED Final   Enterobacterales NOT DETECTED NOT DETECTED Final   Enterobacter cloacae complex NOT DETECTED NOT DETECTED Final   Escherichia coli NOT DETECTED NOT DETECTED Final   Klebsiella aerogenes NOT DETECTED NOT DETECTED Final   Klebsiella oxytoca NOT DETECTED NOT DETECTED Final   Klebsiella pneumoniae  NOT DETECTED NOT DETECTED Final   Proteus species NOT DETECTED NOT DETECTED Final   Salmonella species NOT DETECTED NOT DETECTED Final   Serratia marcescens NOT DETECTED NOT DETECTED Final   Haemophilus influenzae NOT DETECTED NOT DETECTED Final   Neisseria meningitidis  NOT DETECTED NOT DETECTED Final   Pseudomonas aeruginosa NOT DETECTED NOT DETECTED Final   Stenotrophomonas maltophilia NOT DETECTED NOT DETECTED Final   Candida albicans NOT DETECTED NOT DETECTED Final   Candida auris NOT DETECTED NOT DETECTED Final   Candida glabrata NOT DETECTED NOT DETECTED Final   Candida krusei NOT DETECTED NOT DETECTED Final   Candida parapsilosis NOT DETECTED NOT DETECTED Final   Candida tropicalis NOT DETECTED NOT DETECTED Final   Cryptococcus neoformans/gattii NOT DETECTED NOT DETECTED Final    Comment: Performed at [facility], [place] [address], [place], [place] [address]  Culture, blood (routine x 2)     Status: None   Collection Time: 12/05/20  1:59 PM   Specimen: BLOOD RIGHT FOREARM  Result Value Ref Range Status   Specimen Description   Final    BLOOD RIGHT FOREARM Performed at [facility] [address] [place], [place] [address]    Special Requests   Final    BOTTLES DRAWN AEROBIC AND ANAEROBIC Blood Culture adequate volume Performed at [facility] [address] [place], [place] [address]    Culture   Final    NO GROWTH 5 DAYS Performed at [facility], [place] [address], [place], [place] [address]    Report Status 12/10/2020 FINAL  Final  Urine Culture     Status: Abnormal   Collection Time: 12/05/20  3:52 PM   Specimen: Urine, Random  Result Value Ref Range Status   Specimen Description   Final    URINE, RANDOM NEPHROSTOMY Performed at [facility] [address] [place], [place] [address]    Special Requests   Final    NONE Performed at [facility], [place] [address] [place], [place] [address]    Culture 10,000 COLONIES/mL YEAST (A)  Final   Report Status 12/07/2020 FINAL  Final  Aerobic Culture w Gram Stain (superficial specimen)     Status: None   Collection Time: 12/05/20  4:25 PM   Specimen: JP Drain  Result Value Ref Range Status    Specimen Description   Final    JP DRAINAGE Performed at [facility] [address], [place], [place] [address]    Special Requests   Final    NONE Performed at [facility], [place] [address], [place], [place] [address]    Gram Stain   Final    NO SQUAMOUS EPITHELIAL CELLS PRESENT ABUNDANT WBC PRESENT,BOTH PMN AND MONONUCLEAR ABUNDANT GRAM NEGATIVE RODS Performed at [facility], [place] [address], [place], [place] [address]    Culture   Final    ABUNDANT ESCHERICHIA COLI MODERATE PSEUDOMONAS AERUGINOSA    Report Status 12/08/2020 FINAL  Final   Organism ID, Bacteria ESCHERICHIA COLI  Final   Organism ID, Bacteria PSEUDOMONAS AERUGINOSA  Final      Susceptibility   Escherichia coli - MIC*    AMPICILLIN >=32 RESISTANT Resistant     CEFAZOLIN <=4 SENSITIVE Sensitive     CEFEPIME <=0.12 SENSITIVE Sensitive     CEFTAZIDIME <=1 SENSITIVE Sensitive     CEFTRIAXONE <=0.25 SENSITIVE Sensitive     CIPROFLOXACIN <=0.25 SENSITIVE Sensitive  GENTAMICIN <=1 SENSITIVE Sensitive     IMIPENEM <=0.25 SENSITIVE Sensitive     TRIMETH/SULFA >=320 RESISTANT Resistant     AMPICILLIN/SULBACTAM >=32 RESISTANT Resistant     PIP/TAZO <=4 SENSITIVE Sensitive     * ABUNDANT ESCHERICHIA COLI   Pseudomonas aeruginosa - MIC*    CEFTAZIDIME 4 SENSITIVE Sensitive     CIPROFLOXACIN <=0.25 SENSITIVE Sensitive     GENTAMICIN <=1 SENSITIVE Sensitive     IMIPENEM 2 SENSITIVE Sensitive     PIP/TAZO <=4 SENSITIVE Sensitive     CEFEPIME 2 SENSITIVE Sensitive     * MODERATE PSEUDOMONAS AERUGINOSA      RN Pressure Injury Documentation:     Estimated body mass index is 29.03 kg/m as calculated from the following:   Height as of this encounter: 5\' 8"  (1.727 m).   Weight as of this encounter: 86.6 kg.  Malnutrition Type:  Nutrition Problem: Moderate Malnutrition Etiology: chronic illness, cancer and cancer related treatments  Malnutrition  Characteristics:  Signs/Symptoms: mild fat depletion, mild muscle depletion, moderate muscle depletion  Nutrition Interventions:  Interventions: Prostat, Boost Breeze, MVI   Radiology Studies: No results found.  Scheduled Meds:  (feeding supplement) PROSource Plus  30 mL Oral BID BM   acetaminophen (TYLENOL) oral liquid 160 mg/5 mL  650 mg Oral Q6H   vitamin C  1,000 mg Oral Daily   chlorhexidine  15 mL Mouth Rinse BID   Chlorhexidine Gluconate Cloth  6 each Topical Daily   furosemide  20 mg Intravenous BID   gabapentin  200 mg Oral BID   mouth rinse  15 mL Mouth Rinse q12n4p   methocarbamol  1,000 mg Oral TID   mirtazapine  7.5 mg Oral QHS   multivitamin with minerals  1 tablet Oral Daily   pantoprazole  40 mg Oral Daily   predniSONE  5 mg Oral Q breakfast   Continuous Infusions:  heparin 1,800 Units/hr (12/10/20 1303)   piperacillin-tazobactam (ZOSYN)  IV 3.375 g (12/10/20 2119)    LOS: 13 days   Kathie Dike, MD [facility] PAGER is on [place]  If 7PM-7AM, please contact night-coverage [email or web address]

## 2020-12-10 NOTE — Progress Notes (Signed)
Brief Pharmacy Anti-Coagulation Note:  For full details see note from Arlyn Dunning PharmD  A/P: HL 0.88 supra-therapeutic on 1800 units/hr Per RN no bleeding Decrease heparin to 1600 units/hr Check heparin level in 8 hours  Dolly Rias RPh 12/10/2020, 11:20 PM

## 2020-12-10 NOTE — Progress Notes (Signed)
Calorie Count Note  48 hour calorie count ordered.  Diet: Dysphagia 3, thin liquids Supplements: 30 ml Prosource Plus BID, Boost Breeze once/day   Breakfast (9/5): 189 kcal and 6 grams protein Lunch (9/5): 396 kcal and 13 grams protein Dinner (9/5): skipped Supplements: refusing Boost Breeze; 100% Prosource Plus  Total intake: 785 kcal (36% of minimum estimated needs)  49 protein (44% of minimum estimated needs)  Nutrition Dx:  Moderate Malnutrition related to chronic illness, cancer and cancer related treatments as evidenced by mild fat depletion, mild muscle depletion, moderate muscle depletion  Goal:  Patient will meet greater than or equal to 90% of their needs.  Intervention:  - RD will follow-up 9/7 with day #2 results - d/c Boost Breeze - continue 30 ml Prosource Plus BID. - nurse/tech to document amount of Premier Protein consumed       Jarome Matin, MS, RD, LDN, CNSC Inpatient Clinical Dietitian RD pager # available in [facility]  After hours/weekend pager # available in [facility]

## 2020-12-10 NOTE — Plan of Care (Signed)
  Problem: Clinical Measurements: Goal: Will remain free from infection Outcome: Progressing Goal: Diagnostic test results will improve Outcome: Progressing   

## 2020-12-10 NOTE — Progress Notes (Signed)
Progress Note  12 Days Post-Op  Subjective: Patient tolerating diet and having bowel function. Calorie count initiated yesterday. Patient reports abdominal pain is stable. His brother was at the bedside today.   Objective: Vital signs in last 24 hours: Temp:  [97.2 F (36.2 C)-98.3 F (36.8 C)] 98.1 F (36.7 C) (09/06 1232) Pulse Rate:  [80-89] 89 (09/06 1232) Resp:  [16-21] 16 (09/06 1232) BP: (125-140)/(69-76) 129/69 (09/06 1232) SpO2:  [100 %] 100 % (09/06 0810) Weight:  [86.6 kg] 86.6 kg (09/06 0433) Last BM Date: 12/10/20  Intake/Output from previous day: 09/05 0701 - 09/06 0700 In: 932 [P.O.:660; I.V.:173.1; IV Piggyback:98.9] Out: 3125 [Urine:2745; Drains:5; Stool:375] Intake/Output this shift: Total I/O In: 240 [P.O.:240] Out: 350 [Urine:150; Stool:200]  PE: General: pleasant, WD, chronically ill appearing male who is laying in bed in NAD Heart: regular, rate, and rhythm.   Lungs: CTAB, no wheezes, rhonchi, or rales noted.  Respiratory effort nonlabored Abd: soft, appropriately ttp, ND, midline wound with fibrinous exudate in base but improvement in drainage from last week; drain in RLQ with purulent appearing fluid, stoma necrotic but with some pinker tissue centrally, functioning    Lab Results:  Recent Labs    12/09/20 0327 12/10/20 0253  WBC 21.2* 19.6*  HGB 8.0* 8.2*  HCT 24.5* 26.4*  PLT 540* 569*   BMET Recent Labs    12/09/20 0327 12/10/20 0253  NA 142 140  K 3.2* 3.8  CL 113* 110  CO2 23 22  GLUCOSE 98 96  BUN 24* 25*  CREATININE 0.80 0.78  CALCIUM 8.6* 8.5*   PT/INR No results for input(s): LABPROT, INR in the last 72 hours. CMP     Component Value Date/Time   NA 140 12/10/2020 0253   NA 143 11/25/2020 1554   K 3.8 12/10/2020 0253   CL 110 12/10/2020 0253   CO2 22 12/10/2020 0253   GLUCOSE 96 12/10/2020 0253   BUN 25 (H) 12/10/2020 0253   BUN 23 11/25/2020 1554   CREATININE 0.78 12/10/2020 0253   CREATININE 1.00 11/14/2020  1146   CALCIUM 8.5 (L) 12/10/2020 0253   PROT 5.1 (L) 12/04/2020 0327   ALBUMIN 2.1 (L) 12/10/2020 0253   AST 33 12/04/2020 0327   AST 35 11/14/2020 1146   ALT 14 12/04/2020 0327   ALT 45 (H) 11/14/2020 1146   ALKPHOS 119 12/04/2020 0327   BILITOT 0.9 12/04/2020 0327   BILITOT 0.3 11/14/2020 1146   GFRNONAA >60 12/10/2020 0253   GFRNONAA >60 11/14/2020 1146   GFRAA >60 11/29/2019 1306   Lipase     Component Value Date/Time   LIPASE 22 11/27/2020 1035       Studies/Results: No results found.  Anti-infectives: Anti-infectives (From admission, onward)    Start     Dose/Rate Route Frequency Ordered Stop   11/28/20 2230  piperacillin-tazobactam (ZOSYN) IVPB 3.375 g        3.375 g 12.5 mL/hr over 240 Minutes Intravenous Every 8 hours 11/28/20 2121     11/28/20 2215  piperacillin-tazobactam (ZOSYN) IVPB 3.375 g  Status:  Discontinued       Note to Pharmacy: Given Intraoperative   3.375 g 100 mL/hr over 30 Minutes Intravenous Every 8 hours 11/28/20 2117 11/28/20 2121   11/28/20 1730  piperacillin-tazobactam (ZOSYN) IVPB 3.375 g       Note to Pharmacy: Given Intraoperative   3.375 g 100 mL/hr over 30 Minutes Intravenous  Once 11/28/20 1729 11/28/20 1750   11/28/20 1712  piperacillin-tazobactam (ZOSYN) 3.375 GM/50ML IVPB       Note to Pharmacy: Marchia Meiers   : cabinet override      11/28/20 1712 11/28/20 1735   11/28/20 1545  ceFAZolin (ANCEF) IVPB 2g/100 mL premix        2 g 200 mL/hr over 30 Minutes Intravenous  Once 11/28/20 1538 11/28/20 1630   11/28/20 1539  ceFAZolin (ANCEF) 2-4 GM/100ML-% IVPB       Note to Pharmacy: Minor, Anneita   : cabinet override      11/28/20 1539 11/28/20 1654   11/27/20 1700  cephALEXin (KEFLEX) capsule 500 mg  Status:  Discontinued        500 mg Oral 3 times daily 11/27/20 1640 11/28/20 2117        Assessment/Plan Fecal impaction/Severe acute on chronic constipation POD12 s/p ex lap, sigmoid colectomy, end colostomy Dr. Kae Heller  8/25 Findings of large perforation with diffuse feculent peritonitis and extensive contamination of the peritoneal cavity with stool and pus - CT 8/31 shows pelvic collection with surgical drain in good position, drainage is purulent appearing - continue drain and IV abx - WBC 19 from 30 last week, consider repeat CT in the next few days with drain output decreasing  - tolerating Dys 3 diet and calorie count in progress, discussed with RN to make sure premier protein shakes from home are being recorded as well - encourage PO pain meds over IV - WOC consulted, stoma is necrotic but appears to be viable above the fascia  - TID wet-to-dry dressing changes to midline wound, stop Dakin's today  - incentive spirometry, PT/OT - mobilize as able - surgical pathology confirms metastatic prostate cancer with positive LN   FEN: DYS 3 diet, calorie count ID: keflex. Zosyn 8/25>> VTE: heparin gtt   Metastatic prostate cancer ABL anemia on anemia of chronic disease - hgb 8.2, stable DVT, b/l LE   LOS: 13 days    Norm Parcel, [facility] Surgery 12/10/2020, 12:48 PM Please see Amion for pager number during day hours 7:00am-4:30pm

## 2020-12-10 NOTE — Progress Notes (Signed)
ANTICOAGULATION CONSULT NOTE   Pharmacy Consult for heparin Indication: recent bilateral DVT on 8/17 (home xarelto on hold)  Allergies  Allergen Reactions   Ibuprofen Anaphylaxis   Shrimp [Shellfish Allergy] Anaphylaxis   Patient Measurements:  Height: 5\' 8"  (172.7 cm) Weight: 86.6 kg (190 lb 14.7 oz) IBW/kg (Calculated) : 68.4 Heparin Dosing Weight: 87.8 kg  Vital Signs: Temp: 98.1 F (36.7 C) (09/06 0810) Temp Source: Oral (09/06 0810) BP: 137/76 (09/06 0810) Pulse Rate: 83 (09/06 0810)  Labs: Recent Labs    12/08/20 0321 12/09/20 0327 12/10/20 0253 12/10/20 1146  HGB 8.3* 8.0* 8.2*  --   HCT 26.2* 24.5* 26.4*  --   PLT 464* 540* 569*  --   HEPARINUNFRC  --   --  0.13* 0.76*  CREATININE 0.87 0.80 0.78  --    Estimated Creatinine Clearance: 92 mL/min (by C-G formula based on SCr of 0.78 mg/dL).  Medications:  - on xarelto dose pack PTA (on initial 15mg  BID regimen - last dose taken on 11/26/20 at 1800)  Assessment: Patient is a 70 y.o M with metastatic prostate cancer currently undergoing chemotherapy treatment and on xarelto PTA for bilateral DVTs that was diagnosed on 11/20/20, presented to the ED on 11/27/20 with c/o abdominal pain. Surgery and GI consulted for fecal impaction. Pharmacy has been consulted to transition patient to heparin drip in case invasive intervention is needed with this admission. Baseline aPTT 34 sec, heparin level is elevated at 0.91--> is elevated d/t effect of xarelto  Patient taken to OR on 8/25 for sigmoid colectomy and end colostomy placement due to concern for large bowel perforation in setting of fecal impaction. Heparin was held and resumed multiple times during admission. Most recently held on 9/2 for hemoglobin of 5.8.    - 8/28 am Heparin held for bleeding noted from nephrostomy tubes and colostomy - 8/31 Heparin held at 0514 am for new midline bleed - 9/1 OK to resume Heparin per CCS, no bolus - 9/2 Heparin held for hgb of 5.8. -  9/5 OK to resume heparin per TRH, no bolus  Today, 12/10/2020: HL now SUPRAtherapeutic on rate of IV heparin of 2250 units/hr despite level being only 0.13 early this AM Hemoglobin now stable 8's, Platelets elevated but stable. Hematuria resolved per MD Per RN no line interruptions and no bleeding  Goal of Therapy:  Heparin level 0.3-0.7 units/ml Monitor platelets by anticoagulation protocol: Yes   Plan:  Decrease IV heparin from rate 2250 units/hr to 1800 units/hr Recheck heparin level 8 hours after rate decreased. Must request IV team consult to draw sample from line.  Daily CBC, heparin level  Monitor closely for s/sx of bleeding  F/u ability to transition back to [place], PharmD, BCPS Secure Chat if ?s 12/10/2020 12:26 PM

## 2020-12-11 DIAGNOSIS — K631 Perforation of intestine (nontraumatic): Principal | ICD-10-CM

## 2020-12-11 DIAGNOSIS — E440 Moderate protein-calorie malnutrition: Secondary | ICD-10-CM

## 2020-12-11 LAB — CBC
HCT: 26.2 % — ABNORMAL LOW (ref 39.0–52.0)
Hemoglobin: 8.2 g/dL — ABNORMAL LOW (ref 13.0–17.0)
MCH: 30.1 pg (ref 26.0–34.0)
MCHC: 31.3 g/dL (ref 30.0–36.0)
MCV: 96.3 fL (ref 80.0–100.0)
Platelets: 606 10*3/uL — ABNORMAL HIGH (ref 150–400)
RBC: 2.72 MIL/uL — ABNORMAL LOW (ref 4.22–5.81)
RDW: 20.9 % — ABNORMAL HIGH (ref 11.5–15.5)
WBC: 20.2 10*3/uL — ABNORMAL HIGH (ref 4.0–10.5)
nRBC: 0 % (ref 0.0–0.2)

## 2020-12-11 LAB — BASIC METABOLIC PANEL
Anion gap: 5 (ref 5–15)
BUN: 18 mg/dL (ref 8–23)
CO2: 23 mmol/L (ref 22–32)
Calcium: 8.7 mg/dL — ABNORMAL LOW (ref 8.9–10.3)
Chloride: 113 mmol/L — ABNORMAL HIGH (ref 98–111)
Creatinine, Ser: 0.8 mg/dL (ref 0.61–1.24)
GFR, Estimated: >60 mL/min (ref >60)
Glucose, Bld: 96 mg/dL (ref 70–99)
Potassium: 4.4 mmol/L (ref 3.5–5.1)
Sodium: 141 mmol/L (ref 135–145)

## 2020-12-11 LAB — MAGNESIUM: Magnesium: 2 mg/dL (ref 1.7–2.4)

## 2020-12-11 MED ORDER — HEPARIN (PORCINE) 25000 UT/250ML-% IV SOLN
1600.0000 [IU]/h | INTRAVENOUS | Status: DC
  Administered 2020-12-12: 1600 [IU]/h via INTRAVENOUS
  Filled 2020-12-11 (×2): qty 250

## 2020-12-11 MED ORDER — PREMIER PROTEIN SHAKE
11.0000 [oz_av] | Freq: Three times a day (TID) | ORAL | Status: DC
  Administered 2020-12-11 – 2020-12-20 (×19): 11 [oz_av] via ORAL

## 2020-12-11 MED ORDER — NON FORMULARY: 11.0000 [oz_av] | Freq: Three times a day (TID) | Status: DC

## 2020-12-11 MED ORDER — ENSURE MAX PROTEIN PO LIQD
11.0000 [oz_av] | Freq: Two times a day (BID) | ORAL | Status: DC
  Administered 2020-12-11 – 2020-12-12 (×2): 11 [oz_av] via ORAL
  Filled 2020-12-11 (×3): qty 330

## 2020-12-11 MED ORDER — CARVEDILOL 3.125 MG PO TABS
3.1250 mg | ORAL_TABLET | Freq: Two times a day (BID) | ORAL | Status: DC
  Administered 2020-12-11 – 2020-12-20 (×18): 3.125 mg via ORAL
  Filled 2020-12-11 (×18): qty 1

## 2020-12-11 MED ORDER — HYDROMORPHONE HCL 1 MG/ML IJ SOLN
0.5000 mg | INTRAMUSCULAR | Status: DC | PRN
  Administered 2020-12-11 – 2020-12-18 (×36): 1 mg via INTRAVENOUS
  Administered 2020-12-19: 0.5 mg via INTRAVENOUS
  Administered 2020-12-19 – 2020-12-20 (×4): 1 mg via INTRAVENOUS
  Filled 2020-12-11 (×42): qty 1

## 2020-12-11 MED ORDER — GABAPENTIN 300 MG PO CAPS
300.0000 mg | ORAL_CAPSULE | Freq: Two times a day (BID) | ORAL | Status: DC
  Administered 2020-12-11 – 2020-12-18 (×14): 300 mg via ORAL
  Filled 2020-12-11 (×14): qty 1

## 2020-12-11 NOTE — Progress Notes (Signed)
CCMD informed this RN of pt with 10 beats Vtach. This RN present in room with patient during, pt asymptomatic. MD Rodena Piety made aware. Will continue to monitor patient.

## 2020-12-11 NOTE — Progress Notes (Signed)
Blood noted to be in both nephrostomy tubes, also new BRB visible through midline dressing. Heparin stopped, MD made aware. Will continue to monitor patient closely.

## 2020-12-11 NOTE — Progress Notes (Addendum)
Nutrition Follow-up  DOCUMENTATION CODES:   Non-severe (moderate) malnutrition in context of chronic illness  INTERVENTION:  - continue 30 ml Prosource Plus BID. - continue to provide Premier Protein from home.  - if patient to remain Full Code, recommend small bore NGT vs PEG. - TF recommendations: Osmolite 1.5 @ 20 ml/hr to advance by 10 ml every 12 hours to reach goal rate of 60 ml/hr with 45 ml Prosource TF TID and 100 ml free water every 4 hours.  - this regimen will provide 2280 kcal, 123 grams protein, and 1697 ml free water.    NUTRITION DIAGNOSIS:   Moderate Malnutrition related to chronic illness, cancer and cancer related treatments as evidenced by mild fat depletion, mild muscle depletion, moderate muscle depletion. -ongoing  GOAL:   Patient will meet greater than or equal to 90% of their needs -  MONITOR:   PO intake, Supplement acceptance, Labs, Weight trends, Other (Comment) (PEG placement and initiation of TF)  ASSESSMENT:   70 year old male with a history of metastatic prostate cancer, and obstructive uropathy requiring bilateral nephrostomy tubes, presented to ED from home with worsening right-sided abdominal pain.  Patient sleeping at the time of visit. Church member at bedside reports patient was recently given pain medication. She was present for lunch meal and reports that patient ate a total of 2 full spoonfuls of the meal. She states that patient reported that all food has no taste and that some things feel like they get stuck in his throat and he has a hard time getting them down.   Yesterday he ate 25% of breakfast and 20% of lunch--provided a total of 347 kcal and 14.5 grams protein. He accepted 30 ml Prosource TID yesterday which provided an additional 300 kcal and 45 grams of protein to bring total intake to 647 kcal and 60 grams protein.   He is meeting 29% kcal need and 54% protein need.  Intake, if any, of Premier Protein yesterday not documented.    Notes outline that patient has been very weak, very minimally out of bed d/t weakness, and that he speaks in a soft voice. Patient with metastatic prostate cancer and notes indicate that Oncology has felt his prognosis is guarded.   Weight has been fluctuating since admission on 8/24. Mild pitting edema to LLE documented in the edema section of flow sheet. Yesterday he was documented to have moderate pitting edema RLE and deep pitting edema to LLE.    Labs reviewed; Cl: 113 mmol/l, Ca: 8.7 mg/dl. Medications reviewed; 1000 mg ascorbic acid/day, 20 mg IV lasix BID, 2 g IV Mg sulfate x1 run 9/6, 1 tablet multivitamin with minerals/day, 40 mg oral protonix/day, 40 mEq Klor-Con x2 runs 9/6, 5 mg deltasone/day.    Diet Order:   Diet Order             DIET SOFT Room service appropriate? Yes; Fluid consistency: Thin  Diet effective now                   EDUCATION NEEDS:   Education needs have been addressed  Skin:  Skin Assessment: Skin Integrity Issues: Skin Integrity Issues:: Incisions Incisions: abdomen (8/25)  Last BM:  9/1 (type 6 x2)  Height:   Ht Readings from Last 1 Encounters:  11/27/20 5\' 8"  (1.727 m)    Weight:   Wt Readings from Last 1 Encounters:  12/11/20 89 kg      Estimated Nutritional Needs:  Kcal:  2200-2400 kcal/d Protein:  110-125 g/d Fluid:  2.2-2.4 L/d      Jarome Matin, MS, RD, LDN, CNSC Inpatient Clinical Dietitian RD pager # available in AMION  After hours/weekend pager # available in [facility]

## 2020-12-11 NOTE — Progress Notes (Addendum)
PROGRESS NOTE    Andre Glass  JSE:[id number] DOB: [birth year] DOA: 11/27/2020 PCP: [name], DO   Brief Narrative: 70 year old male with a history of metastatic prostate cancer, chronic bilateral nephrostomies due to obstructive uropathy from underlying cancer, was admitted to the hospital with right-sided abdominal pain.  Initial CT scan performed did not show any specific obstructive pathology in the bowel.  He was initially seen by GI and general surgery they recommended continue bowel regimen and continued observation.  CT also showed bilateral DVTs and he was started on anticoagulation.  The patient was receiving enemas and unfortunately developed perforation of colon with peritonitis.  He was emergently taken to the operating room and underwent laparotomy, partial colectomy and colostomy.  Biopsies from surgery confirmed metastatic prostate cancer.  He has a JP drain in place and there is concern for underlying abscess.  He is currently on IV antibiotics.  Postoperative recovery has been slow due to his multiple medical issues.  He was seen by both oncology and palliative care.  Patient wishes to continue with current treatments with hopes for improvement.  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 Windle Guard.  He presented with a 3-day history of fecal impaction in the setting of prostate cancer and narcotics at home.   He did not respond to stool softeners and laxatives. GI consulted recommended enemas Patient subsequently became hypotensive with some guarding and rigidity of his abdomen with diaphoresis.  Stat KUB showed possible free air.  General surgery was reconsulted who took him to the OR on an emergent basis.  He underwent exploratory laparotomy sigmoid colectomy and end colostomy.  Surgical pathology showed metastatic prostate cancer. Palliative care care has been consulted.  #2 right lower extremity DVT -Heparin was on hold for few hours today restarted again since hematuria resolved.   #3 metastatic prostate cancer oncology has been consulted with no  new recommendations currently.  #4 acute on chronic anemia he received 4 units of packed RBCs during his hospital stay.  We will need to watch closely on heparin with hematuria.  #5 bilateral nephrostomy tubes with hematuria monitor.  Daily.  #6 moderate malnutrition-his p.o. intake has been poor on dysphagia 3 diet   Nutrition Problem: Moderate Malnutrition Etiology: chronic illness, cancer and cancer related treatments     Signs/Symptoms: mild fat depletion, mild muscle depletion, moderate muscle depletion    Interventions: Prostat, Premier Protein, MVI  Estimated body mass index is 29.83 kg/m as calculated from the following:   Height as of this encounter: 5\' 8"  (1.727 m).   Weight as of this encounter: 89 kg.  DVT prophylaxis: Heparin drip on hold this morning due to hematuria  code Status: Full code  family Communication: None at bedside  Disposition Plan:  Status is: Inpatient  Remains inpatient appropriate because:Hemodynamically unstable   Dispo: The patient is from: Home              Anticipated d/c is to:  SNF              Patient currently is not medically stable to d/c.   Difficult to place patient Yes       Consultants:  General surgery, GI, urology, oncology, palliative care  Procedures: Exploratory  laparotomy sigmoid colectomy and end colostomy as an emergent procedure Antimicrobials:  Anti-infectives (From admission, onward)    Start     Dose/Rate Route Frequency Ordered Stop   11/28/20 2230  piperacillin-tazobactam (ZOSYN) IVPB 3.375 g        3.375 g 12.5 mL/hr over 240 Minutes Intravenous Every 8 hours 11/28/20 2121     11/28/20 2215  piperacillin-tazobactam (ZOSYN) IVPB 3.375 g  Status:  Discontinued       Note to Pharmacy: Given Intraoperative   3.375 g 100 mL/hr over 30 Minutes Intravenous Every 8 hours 11/28/20 2117 11/28/20 2121   11/28/20 1730  piperacillin-tazobactam (ZOSYN) IVPB 3.375 g       Note to Pharmacy: Given Intraoperative   3.375 g 100 mL/hr over 30 Minutes Intravenous  Once 11/28/20 1729 11/28/20 1750   11/28/20 1712  piperacillin-tazobactam (ZOSYN) 3.375 GM/50ML IVPB       Note to Pharmacy: Marchia Meiers   : cabinet override      11/28/20 1712 11/28/20 1735   11/28/20 1545  ceFAZolin (ANCEF) IVPB 2g/100 mL premix        2 g 200 mL/hr over 30 Minutes Intravenous  Once 11/28/20 1538 11/28/20 1630   11/28/20 1539  ceFAZolin (ANCEF) 2-4 GM/100ML-% IVPB       Note to Pharmacy: Minor, Anneita   : cabinet override      11/28/20 1539 11/28/20 1654   11/27/20 1700  cephALEXin (KEFLEX) capsule 500 mg  Status:  Discontinued        500 mg Oral 3 times daily 11/27/20 1640 11/28/20 2117        Subjective:  He is resting in bed he appears extremely weak and ill He is trying to hold the banana in his left hand and is taking small bites very slowly Hematuria noted with some fresh bleeding noted through his dressings on the abdomen Objective: Vitals:   12/10/20 2116 12/11/20 0428 12/11/20 0548 12/11/20 1238  BP: (!) 151/72 138/73  135/70  Pulse: 85 89  93  Resp: 16 16  (!) 28  Temp: 98.1 F (36.7 C) 98.5 F (36.9 C)  98.4 F (36.9 C)  TempSrc: Oral Oral  Oral  SpO2: 97% 100%  100%  Weight:   89 kg   Height:        Intake/Output Summary (Last 24 hours)  at 12/11/2020 1423 Last data filed at 12/11/2020 1329 Gross per 24 hour  Intake 2270.2 ml  Output 3750 ml  Net -1479.8 ml   Filed Weights   12/09/20 0500 12/10/20 0433 12/11/20 0548  Weight: 93 kg 86.6 kg 89 kg    Examination:  General exam: Appears calm and comfortable  Respiratory system: Clear to auscultation. Respiratory effort normal. Cardiovascular system: S1 & S2 heard, RRR. No JVD, murmurs, rubs, gallops or clicks. No pedal edema. Gastrointestinal system: Abdomen is nondistended, soft and nontender. No organomegaly or masses felt. Normal bowel sounds heard. Central nervous system: Alert and oriented. No focal neurological deficits. Extremities: 2+ edema Skin: No rashes, lesions or ulcers Psychiatry: Judgement and insight appear normal. Mood & affect appropriate.     Data Reviewed: I have personally reviewed following labs and imaging studies  CBC: Recent Labs  Lab 12/06/20 1039 12/06/20 2311 12/07/20 0454 12/08/20 0321 12/09/20 0327 12/10/20 0253 12/11/20 0438  WBC 32.3*   < > 25.2* 23.4* 21.2* 19.6* 20.2*  NEUTROABS 31.3*  --   --   --   --   --   --   HGB 5.8*   < > 7.2* 8.3* 8.0* 8.2* 8.2*  HCT 18.7*   < > 22.8* 26.2* 24.5* 26.4* 26.2*  MCV 96.9   < > 94.6 92.6 92.8 96.7 96.3  PLT 398   < > 422* 464* 540* 569* 606*   < > = values in this interval not displayed.   Basic Metabolic Panel: Recent Labs  Lab 12/06/20 1039 12/07/20 0454 12/08/20 0321 12/09/20 0327 12/10/20 0253 12/11/20 0438  NA 139 139 138 142 140 141  K 3.3* 3.4* 3.6 3.2* 3.8 4.4  CL 113* 114* 111 113* 110 113*  CO2 18* 22 21* 23 22 23   GLUCOSE 98 95 90 98 96 96  BUN 21 25* 24* 24* 25* 18  CREATININE 0.85 0.89 0.87 0.80 0.78 0.80  CALCIUM 7.8* 8.1* 8.1* 8.6* 8.5* 8.7*  MG  --   --  1.8  --  1.8 2.0  PHOS 3.4 2.7 3.3 2.8 2.9  --    GFR: Estimated Creatinine Clearance: 93.1 mL/min (by C-G formula based on SCr of 0.8 mg/dL). Liver Function Tests: Recent Labs  Lab 12/06/20 1039  12/07/20 0454 12/08/20 0321 12/09/20 0327 12/10/20 0253  ALBUMIN 2.3* 2.2* 2.0* 2.4* 2.1*   No results for input(s): LIPASE, AMYLASE in the last 168 hours. No results for input(s): AMMONIA in the last 168 hours. Coagulation Profile: No results for input(s): INR, PROTIME in the last 168 hours. Cardiac Enzymes: No results for input(s): CKTOTAL, CKMB, CKMBINDEX, TROPONINI in the last 168 hours. BNP (last 3 results) No results for input(s): PROBNP in the last 8760 hours. HbA1C: No results for input(s): HGBA1C in the last 72 hours. CBG: No results for input(s): GLUCAP in the last 168 hours. Lipid Profile: No results for input(s): CHOL, HDL, LDLCALC, TRIG, CHOLHDL, LDLDIRECT in the last 72 hours. Thyroid Function Tests: No results for input(s): TSH, T4TOTAL, FREET4, T3FREE, THYROIDAB in the last 72 hours. Anemia Panel: No results for input(s): VITAMINB12, FOLATE, FERRITIN, TIBC, IRON, RETICCTPCT in the last 72 hours. Sepsis Labs: No results for input(s): PROCALCITON, LATICACIDVEN in the last 168 hours.  Recent Results (from the past 240 hour(s))  Culture, blood (routine x 2)     Status: Abnormal (Preliminary result)   Collection Time: 12/05/20  1:51 PM   Specimen: BLOOD  Result Value Ref Range Status   Specimen Description   Final    BLOOD RIGHT ANTECUBITAL Performed at [facility] [address] [place], [place] [address]    Special Requests   Final    BOTTLES DRAWN AEROBIC AND ANAEROBIC Blood Culture adequate volume Performed at [facility] [address], [place], [place] [address]    Culture  Setup Time (A)  Final    GRAM VARIABLE ROD ANAEROBIC BOTTLE ONLY CRITICAL RESULT CALLED TO, READ BACK BY AND VERIFIED WITH: L POINDEXTER,[email or web address]  12/08/20 [place]    Culture (A)  Final    GRAM VARIABLE ROD CULTURE REINCUBATED FOR BETTER GROWTH Performed at [facility], [place] [address] [place], [place] [address]    Report Status  PENDING  Incomplete  Blood Culture ID Panel (Reflexed)     Status: None   Collection Time: 12/05/20  1:51  PM  Result Value Ref Range Status   Enterococcus faecalis NOT DETECTED NOT DETECTED Final   Enterococcus Faecium NOT DETECTED NOT DETECTED Final   Listeria monocytogenes NOT DETECTED NOT DETECTED Final   Staphylococcus species NOT DETECTED NOT DETECTED Final   Staphylococcus aureus (BCID) NOT DETECTED NOT DETECTED Final   Staphylococcus epidermidis NOT DETECTED NOT DETECTED Final   Staphylococcus lugdunensis NOT DETECTED NOT DETECTED Final   Streptococcus species NOT DETECTED NOT DETECTED Final   Streptococcus agalactiae NOT DETECTED NOT DETECTED Final   Streptococcus pneumoniae NOT DETECTED NOT DETECTED Final   Streptococcus pyogenes NOT DETECTED NOT DETECTED Final   A.calcoaceticus-baumannii NOT DETECTED NOT DETECTED Final   Bacteroides fragilis NOT DETECTED NOT DETECTED Final   Enterobacterales NOT DETECTED NOT DETECTED Final   Enterobacter cloacae complex NOT DETECTED NOT DETECTED Final   Escherichia coli NOT DETECTED NOT DETECTED Final   Klebsiella aerogenes NOT DETECTED NOT DETECTED Final   Klebsiella oxytoca NOT DETECTED NOT DETECTED Final   Klebsiella pneumoniae NOT DETECTED NOT DETECTED Final   Proteus species NOT DETECTED NOT DETECTED Final   Salmonella species NOT DETECTED NOT DETECTED Final   Serratia marcescens NOT DETECTED NOT DETECTED Final   Haemophilus influenzae NOT DETECTED NOT DETECTED Final   Neisseria meningitidis NOT DETECTED NOT DETECTED Final   Pseudomonas aeruginosa NOT DETECTED NOT DETECTED Final   Stenotrophomonas maltophilia NOT DETECTED NOT DETECTED Final   Candida albicans NOT DETECTED NOT DETECTED Final   Candida auris NOT DETECTED NOT DETECTED Final   Candida glabrata NOT DETECTED NOT DETECTED Final   Candida krusei NOT DETECTED NOT DETECTED Final   Candida parapsilosis NOT DETECTED NOT DETECTED Final   Candida tropicalis NOT DETECTED NOT  DETECTED Final   Cryptococcus neoformans/gattii NOT DETECTED NOT DETECTED Final    Comment: Performed at [facility], [address], [place], [place] [address]  Culture, blood (routine x 2)     Status: None   Collection Time: 12/05/20  1:59 PM   Specimen: BLOOD RIGHT FOREARM  Result Value Ref Range Status   Specimen Description   Final    BLOOD RIGHT FOREARM Performed at [facility] [address] [place], [place] [address]    Special Requests   Final    BOTTLES DRAWN AEROBIC AND ANAEROBIC Blood Culture adequate volume Performed at [facility] [address] [place], [place] [address]    Culture   Final    NO GROWTH 5 DAYS Performed at [facility], [place] [address] [place], [place] [address]    Report Status 12/10/2020 FINAL  Final  Urine Culture     Status: Abnormal   Collection Time: 12/05/20  3:52 PM   Specimen: Urine, Random  Result Value Ref Range Status   Specimen Description   Final    URINE, RANDOM NEPHROSTOMY Performed at [facility] [address] [place], [place] [address]    Special Requests   Final    NONE Performed at [facility], [place] [address] [place], [place] [address]    Culture 10,000 COLONIES/mL YEAST (A)  Final   Report Status 12/07/2020 FINAL  Final  Aerobic Culture w Gram Stain (superficial specimen)     Status: None   Collection Time: 12/05/20  4:25 PM   Specimen: JP Drain  Result Value Ref Range Status   Specimen Description   Final    JP DRAINAGE Performed at [facility]  [address] [place], [place] [address]    Special Requests   Final    NONE Performed at [facility], [place] [address], [place], [place] [address]    Gram Stain   Final    NO SQUAMOUS EPITHELIAL CELLS PRESENT ABUNDANT WBC PRESENT,BOTH PMN AND MONONUCLEAR ABUNDANT GRAM NEGATIVE RODS Performed at [facility], [place] [address], [place], [place] [address]    Culture   Final    ABUNDANT ESCHERICHIA COLI MODERATE PSEUDOMONAS AERUGINOSA    Report Status 12/08/2020 FINAL  Final   Organism ID, Bacteria ESCHERICHIA COLI  Final   Organism ID, Bacteria PSEUDOMONAS AERUGINOSA  Final      Susceptibility   Escherichia coli - MIC*    AMPICILLIN >=32 RESISTANT Resistant     CEFAZOLIN <=4 SENSITIVE Sensitive     CEFEPIME <=0.12 SENSITIVE Sensitive     CEFTAZIDIME <=1 SENSITIVE Sensitive     CEFTRIAXONE <=0.25 SENSITIVE Sensitive     CIPROFLOXACIN <=0.25 SENSITIVE Sensitive     GENTAMICIN <=1 SENSITIVE Sensitive     IMIPENEM <=0.25 SENSITIVE Sensitive     TRIMETH/SULFA >=320 RESISTANT Resistant     AMPICILLIN/SULBACTAM >=32 RESISTANT Resistant     PIP/TAZO <=4 SENSITIVE Sensitive     * ABUNDANT ESCHERICHIA COLI   Pseudomonas aeruginosa - MIC*    CEFTAZIDIME 4 SENSITIVE Sensitive     CIPROFLOXACIN <=0.25 SENSITIVE Sensitive     GENTAMICIN <=1 SENSITIVE Sensitive     IMIPENEM 2 SENSITIVE Sensitive     PIP/TAZO <=4 SENSITIVE Sensitive     CEFEPIME 2 SENSITIVE Sensitive     * MODERATE PSEUDOMONAS AERUGINOSA         Radiology Studies: No results found.      Scheduled Meds:  (feeding supplement) PROSource Plus  30 mL Oral BID BM   acetaminophen (TYLENOL) oral liquid 160 mg/5 mL  650 mg Oral Q6H   vitamin C  1,000 mg Oral Daily   carvedilol  3.125 mg Oral BID WC   chlorhexidine  15 mL Mouth Rinse BID   Chlorhexidine Gluconate Cloth  6 each Topical Daily   furosemide  20 mg Intravenous BID   gabapentin  300 mg Oral BID   mouth rinse  15 mL Mouth Rinse q12n4p   methocarbamol  1,000 mg Oral TID   mirtazapine  7.5 mg Oral QHS   multivitamin with minerals  1 tablet Oral Daily   pantoprazole  40 mg Oral Daily   predniSONE  5 mg Oral Q breakfast   Ensure Max Protein  11 oz Oral BID   protein supplement shake  11 oz Oral TID   Continuous Infusions:  piperacillin-tazobactam (ZOSYN)  IV 3.375 g (12/11/20 0550)      LOS: 14 days    Time spent: 39 min   Georgette Shell, MD 12/11/2020, 2:23 PM

## 2020-12-11 NOTE — Progress Notes (Addendum)
Per MD, OK to resume heparin this evening Resume at previous rate of 1600 units/hr Check heparin level in 8 hr Watch for any new bleeding (ie blood in nephrostomy tubes)  Reuel Boom, PharmD, BCPS [phone] 12/11/2020, 6:00 PM

## 2020-12-11 NOTE — Progress Notes (Signed)
13 Days Post-Op    CC: Abdominal pain  Subjective: He is extremely weak, he says he is not out of bed much because he is too weak.  His voice is very soft and diminished.  He has stool is in his ostomy.  The JP drain has some feculent appearing drainage in the bulb but not a great deal.  I did strip it and did not give any more fluid.  His midline incision is open and clean the sutures feel loose at the base.  Objective: Vital signs in last 24 hours: Temp:  [98.1 F (36.7 C)-98.5 F (36.9 C)] 98.5 F (36.9 C) (09/07 0428) Pulse Rate:  [85-89] 89 (09/07 0428) Resp:  [16] 16 (09/07 0428) BP: (129-151)/(69-73) 138/73 (09/07 0428) SpO2:  [97 %-100 %] 100 % (09/07 0428) Weight:  [89 kg] 89 kg (09/07 0548) Last BM Date: 12/10/20 2300 IV 360 p.o. 2250 urine Drain 0 Stool 200 Afebrile vital signs are stable Sodium 141, potassium 4.4, creatinine 0.8 mag 2.0 WBC stable 20.2 Transfuse 12/09/2020 -2 units packed RBCs CT scan 12/04/2020 status post interval laparotomy with sigmoid colon resection and left lower quadrant end colostomy: Surgical drain was present in a 5.2 x 3.1 cm fluid collection concerning for abscess. Bilateral percutaneous nephrostomy tubes and double-J ureteral stent catheter remain in position with formed pigtails. Prostatomegaly with metastatic lymphadenopathy and osseous metastatic disease, unchanged compared to recent. Anasarca, worsened compared to prior examination.  Moderate, right greater than left pleural effusions, worsened compared to prior examination.  Intake/Output from previous day: 09/06 0701 - 09/07 0700 In: 2390.2 [P.O.:360; I.V.:174.4; IV Piggyback:130.8] Out: 2450 [Urine:2250; Stool:200] Intake/Output this shift: Total I/O In: 120 [P.O.:120] Out: 1025 [Urine:950; Stool:75]  General appearance: alert, cooperative, no distress, and appears very weakened Resp: He is clear anterior but his inspiratory effort is extremely poor.  He did not have an  incentive spirometer in the room I got a 1 and he is not even pulling 200 so far GI: His ostomy is working well or stool in the ostomy bag along with gas.  Midline incision is pictured below.  It is pink but there was some green drainage at the very base of the dressing.  The sutures feel loose at the base.  JP drain has some feculent appearing dark brown fluid within the bulb.   Lab Results:  Recent Labs    12/10/20 0253 12/11/20 0438  WBC 19.6* 20.2*  HGB 8.2* 8.2*  HCT 26.4* 26.2*  PLT 569* 606*    BMET Recent Labs    12/10/20 0253 12/11/20 0438  NA 140 141  K 3.8 4.4  CL 110 113*  CO2 22 23  GLUCOSE 96 96  BUN 25* 18  CREATININE 0.78 0.80  CALCIUM 8.5* 8.7*   PT/INR No results for input(s): LABPROT, INR in the last 72 hours.  Recent Labs  Lab 12/06/20 1039 12/07/20 0454 12/08/20 0321 12/09/20 0327 12/10/20 0253  ALBUMIN 2.3* 2.2* 2.0* 2.4* 2.1*     Lipase     Component Value Date/Time   LIPASE 22 11/27/2020 1035     Medications:  (feeding supplement) PROSource Plus  30 mL Oral BID BM   acetaminophen (TYLENOL) oral liquid 160 mg/5 mL  650 mg Oral Q6H   vitamin C  1,000 mg Oral Daily   carvedilol  3.125 mg Oral BID WC   chlorhexidine  15 mL Mouth Rinse BID   Chlorhexidine Gluconate Cloth  6 each Topical Daily   furosemide  20 mg Intravenous BID   gabapentin  200 mg Oral BID   mouth rinse  15 mL Mouth Rinse q12n4p   methocarbamol  1,000 mg Oral TID   mirtazapine  7.5 mg Oral QHS   multivitamin with minerals  1 tablet Oral Daily   pantoprazole  40 mg Oral Daily   predniSONE  5 mg Oral Q breakfast    Assessment/Plan Fecal impaction/Severe acute on chronic constipation POD#13 s/p ex lap, sigmoid colectomy, end colostomy Dr. Kae Heller 8/25 Findings of large perforation with diffuse feculent peritonitis and extensive contamination of the peritoneal cavity with stool and pus - CT 8/31 shows pelvic collection with surgical drain in good position, drainage  is purulent appearing - continue drain and IV abx - WBC 32.2(9/2)>>25.2>>23.4>>19.6>>20.2(9/7) - tolerating Dys 3 diet and calorie count in progress, discussed with RN to make sure premier protein shakes from home are being recorded as well - encourage PO pain meds over IV - WOC consulted, stoma is necrotic but appears to be viable above the fascia  - TID wet-to-dry dressing changes to midline wound, stop Dakin's today  - incentive spirometry, PT/OT - mobilize as able - surgical pathology confirms metastatic prostate cancer with positive LN   FEN: DYS 3 diet, calorie count >> soft diet ID: keflex. Zosyn 8/25>> day 13 VTE: heparin gtt Follow up:  Dr. Kae Heller Pain: Tylenol 650 x 5, Neurontin 200 IV BI, dilaudid 1 mg x 5, Robaxin 1 g 3 times daily, oxycodone 10 mg x 2 yesterday   Metastatic prostate cancer ABL anemia on anemia of chronic disease - hgb 8.2, stable DVT, b/l LE  Wide complex tachycardia 12/11/20  - K+ 4.4, Mag 2.0       LOS: 14 days    Lorin Hauck 12/11/2020 Please see Amion

## 2020-12-12 ENCOUNTER — Inpatient Hospital Stay (HOSPITAL_COMMUNITY): Payer: Medicare Other

## 2020-12-12 DIAGNOSIS — R109 Unspecified abdominal pain: Secondary | ICD-10-CM

## 2020-12-12 DIAGNOSIS — K658 Other peritonitis: Secondary | ICD-10-CM

## 2020-12-12 LAB — CBC
HCT: 25.2 % — ABNORMAL LOW (ref 39.0–52.0)
Hemoglobin: 7.9 g/dL — ABNORMAL LOW (ref 13.0–17.0)
MCH: 30.3 pg (ref 26.0–34.0)
MCHC: 31.3 g/dL (ref 30.0–36.0)
MCV: 96.6 fL (ref 80.0–100.0)
Platelets: 831 10*3/uL — ABNORMAL HIGH (ref 150–400)
RBC: 2.61 MIL/uL — ABNORMAL LOW (ref 4.22–5.81)
RDW: 20.3 % — ABNORMAL HIGH (ref 11.5–15.5)
WBC: 21.1 10*3/uL — ABNORMAL HIGH (ref 4.0–10.5)
nRBC: 0 % (ref 0.0–0.2)

## 2020-12-12 LAB — HEPARIN LEVEL (UNFRACTIONATED)
Heparin Unfractionated: 0.4 IU/mL (ref 0.30–0.70)
Heparin Unfractionated: 0.24 IU/mL — ABNORMAL LOW (ref 0.30–0.70)

## 2020-12-12 MED ORDER — IOHEXOL 350 MG/ML SOLN
80.0000 mL | Freq: Once | INTRAVENOUS | Status: AC | PRN
  Administered 2020-12-12: 80 mL via INTRAVENOUS

## 2020-12-12 MED ORDER — IOHEXOL 9 MG/ML PO SOLN
500.0000 mL | ORAL | Status: AC
  Administered 2020-12-12 (×2): 500 mL via ORAL

## 2020-12-12 MED ORDER — IOHEXOL 9 MG/ML PO SOLN
ORAL | Status: AC
  Filled 2020-12-12: qty 1000

## 2020-12-12 NOTE — Progress Notes (Signed)
Upon assessment of patient, patient requesting pain medication for severe pain. Pt reports tylenol and oxycodone are ineffective in management of pain. Pain education provided regarding medication combinations to help with prolonged relief of pain to prevent severe pain symptoms from reoccurring. Pt continues to refuse tylenol and oxycodone. Only agreeable to dilaudid administration at this time. Will continue to monitor patient and work towards goal of pain management. Pt repositioned for comfort, warm blanket applied. Dilaudid given iv for relief of severe pain.

## 2020-12-12 NOTE — Progress Notes (Signed)
14 Days Post-Op    CC: Abdominal pain  Subjective: No real significant change.  He looks very weak and tired.  Therapy is just got done working with him so that might be part of it.  He was mildly tachycardic earlier this AM.  He and his brother both say he is eating better with the upgrade to a soft diet.  His ostomy is working.  Objective: Vital signs in last 24 hours: Temp:  [97.1 F (36.2 C)-99.3 F (37.4 C)] 99.3 F (37.4 C) (09/08 0523) Pulse Rate:  [90-93] 90 (09/08 0523) Resp:  [19-22] 20 (09/08 0523) BP: (121-141)/(70-79) 121/70 (09/08 0523) SpO2:  [98 %-100 %] 98 % (09/08 0523) Last BM Date: 12/10/20 120 p.o. 205 IV 3200 urine Drain 5 cc Stool 325 cc. T-max 99.3 heart rates up this a.m. 113, respiratory rate was 3007 100. WBC continuing to rise 21.7 today. Last CT 8/31 Intake/Output from previous day: 09/07 0701 - 09/08 0700 In: 325.6 [P.O.:120; I.V.:205.6] Out: 3530 [Urine:3200; Drains:5; Stool:325] Intake/Output this shift: Total I/O In: 240 [P.O.:240] Out: -   General appearance: alert, cooperative, no distress, and appears frail and tired. Resp: Clear anterior GI: Midline wounds not significantly changed.  Drainage in the JP is unchanged brownish colored fluid.  Ostomy is working.  Lab Results:  Recent Labs    12/11/20 0438 12/12/20 0231  WBC 20.2* 21.1*  HGB 8.2* 7.9*  HCT 26.2* 25.2*  PLT 606* 831*    BMET Recent Labs    12/10/20 0253 12/11/20 0438  NA 140 141  K 3.8 4.4  CL 110 113*  CO2 22 23  GLUCOSE 96 96  BUN 25* 18  CREATININE 0.78 0.80  CALCIUM 8.5* 8.7*   PT/INR No results for input(s): LABPROT, INR in the last 72 hours.  Recent Labs  Lab 12/06/20 1039 12/07/20 0454 12/08/20 0321 12/09/20 0327 12/10/20 0253  ALBUMIN 2.3* 2.2* 2.0* 2.4* 2.1*     Lipase     Component Value Date/Time   LIPASE 22 11/27/2020 1035     Medications:  (feeding supplement) PROSource Plus  30 mL Oral BID BM   acetaminophen (TYLENOL)  oral liquid 160 mg/5 mL  650 mg Oral Q6H   vitamin C  1,000 mg Oral Daily   carvedilol  3.125 mg Oral BID WC   chlorhexidine  15 mL Mouth Rinse BID   Chlorhexidine Gluconate Cloth  6 each Topical Daily   furosemide  20 mg Intravenous BID   gabapentin  300 mg Oral BID   mouth rinse  15 mL Mouth Rinse q12n4p   methocarbamol  1,000 mg Oral TID   mirtazapine  7.5 mg Oral QHS   multivitamin with minerals  1 tablet Oral Daily   pantoprazole  40 mg Oral Daily   predniSONE  5 mg Oral Q breakfast   Ensure Max Protein  11 oz Oral BID   protein supplement shake  11 oz Oral TID    Assessment/Plan Fecal impaction/Severe acute on chronic constipation/acute perforation after enema POD#13 s/p ex lap, sigmoid colectomy, end colostomy Dr. Kae Heller 8/25 Findings of large perforation with diffuse feculent peritonitis and extensive contamination of the peritoneal cavity with stool and pus - CT 8/31 shows pelvic collection with surgical drain in good position, drainage is purulent appearing - continue drain and IV abx - WBC 32.2(9/2)>>25.2>>23.4>>19.6>>20.2>>21.7(9/8) - tolerating Dys 3 diet and calorie count in progress, discussed with RN to make sure premier protein shakes from home are being recorded as well -  encourage PO pain meds over IV - WOC consulted, stoma is necrotic but appears to be viable above the fascia  - TID wet-to-dry dressing changes to midline wound, stop Dakin's today  - incentive spirometry, PT/OT - mobilize as able - surgical pathology confirms metastatic prostate cancer with positive LN  -Repeat CT scan today/labs tomorrow.   FEN: DYS 3 diet, calorie count >> soft diet ID: keflex. Zosyn 8/25>> day 13 VTE: heparin gtt Follow up:  Dr. Kae Heller Pain: Tylenol 650 x 3, Neurontin 200 x 2, dilaudid 1 mg x 2, Robaxin 1 g x 3, oxycodone 10 mg x 2 yesterday   Metastatic prostate cancer ABL anemia on anemia of chronic disease - hgb 8.2, stable DVT, b/l LE  Wide complex tachycardia  12/11/20  - K+ 4.4, Mag 2.0 Severe malnutrition  - meeting 29% kcal need and 54% protein need 11/10/20  - prealbumin 8/9  - Nutrition recommending TF  -Discussed core track and possibly supplementing his diet with p.m. feedings.  He is currently eating more with a soft diet and would like to continue with a soft diet for now.           LOS: 15 days    Hertha Gergen 12/12/2020 Please see Amion

## 2020-12-12 NOTE — Progress Notes (Signed)
Daily Progress Note   Patient Name: Andre Glass       Date: 12/12/2020 DOB: [birth year]  Age: 70 y.o. MRN#: [id number] Attending Physician: Georgette Shell, MD Primary Care Physician: [name], DO Admit Date: 11/27/2020  Reason for Consultation/Follow-up: Establishing goals of care  Subjective: I saw and examined Andre Glass today.    His brother, Andre Glass, was at the bedside as well.  RN was working on dressing changes at time of encounter.  We reviewed nutrition and RN reports that he had 100% of breakfast.  He had contrast for ordered CT so he did not have lunch.  Length of Stay: 15  Current Medications: Scheduled Meds:   (feeding supplement) PROSource Plus  30 mL Oral BID BM   acetaminophen (TYLENOL) oral liquid 160 mg/5 mL  650 mg Oral Q6H   vitamin C  1,000 mg Oral Daily   carvedilol  3.125 mg Oral BID WC   chlorhexidine  15 mL Mouth Rinse BID   Chlorhexidine Gluconate Cloth  6 each Topical Daily   furosemide  20 mg Intravenous BID   gabapentin  300 mg Oral BID   iohexol       mouth rinse  15 mL Mouth Rinse q12n4p   methocarbamol  1,000 mg Oral TID   mirtazapine  7.5 mg Oral QHS   multivitamin with minerals  1 tablet Oral Daily   pantoprazole  40 mg Oral Daily   predniSONE  5 mg Oral Q breakfast   protein supplement shake  11 oz Oral TID    Continuous Infusions:  piperacillin-tazobactam (ZOSYN)  IV 3.375 g (12/12/20 2238)    PRN Meds: alum & mag hydroxide-simeth, HYDROmorphone (DILAUDID) injection, ondansetron **OR** ondansetron (ZOFRAN) IV, oxyCODONE, prochlorperazine, sodium chloride flush, tamsulosin  Physical Exam    General: Awake and alert.  He is still very weak, but his voice is stronger than it was when I saw him a week ago. HEENT: No bruits, no goiter,  no JVD Heart: Regular rate and rhythm. No murmur appreciated. Lungs: Good air movement, clear Abdomen: Soft, globally tender, multiple drains  Ext: No significant edema Skin: Warm and dry       Vital Signs: BP 123/68 (BP Location: Right Arm)   Pulse 88   Temp 98.6 F (37 C) (Oral)   Resp  18   Ht _0  (1.727 m)   Wt 89 kg   SpO2 98%   BMI 29.83 kg/m  SpO2: SpO2: 98 % O2 Device: O2 Device: Room Air O2 Flow Rate: O2 Flow Rate (L/min): 0 L/min  Intake/output summary:  Intake/Output Summary (Last 24 hours) at 12/12/2020 2247 Last data filed at 12/12/2020 1900 Gross per 24 hour  Intake 861.95 ml  Output 2380 ml  Net -1518.05 ml    LBM: Last BM Date: 12/10/20 Baseline Weight: Weight: 83.5 kg Most recent weight: Weight: 89 kg       Palliative Assessment/Data:    Flowsheet Rows    Flowsheet Row Most Recent Value  Intake Tab   Referral Department Hospitalist  Unit at Time of Referral Med/Surg Unit  Palliative Care Primary Diagnosis Sepsis/Infectious Disease  Date Notified 11/27/20  Palliative Care Type New Palliative care  Reason for referral Clarify Goals of Care  Date of Admission 11/27/20  # of days IP prior to Palliative referral 0  Clinical Assessment   Psychosocial & Spiritual Assessment   Palliative Care Outcomes        Patient Active Problem List   Diagnosis Date Noted   Stercoral perforation of colon s/p Hartmann colectomy/colostomy 11/28/2020 12/08/2020   Fecal peritonitis ([place]) 12/08/2020   Malnutrition of moderate degree 12/05/2020   Fecal impaction ([place]) 11/27/2020   Hypokalemia 11/27/2020   Hypomagnesemia 11/27/2020   DVT of deep femoral vein, left (H[facility]) 11/27/2020   Abdominal pain    Severe protein-calorie malnutrition ([place]) 10/21/2020   Acute cystitis without hematuria    Anemia    Bacteremia due to Klebsiella pneumoniae    Altered mental status 10/18/2020   Myoclonus    Symptomatic anemia 10/17/2020   AKI (acute kidney injury) ([place])  09/28/2020   Hydronephrosis 09/28/2020   Obstructive uropathy 09/28/2020   Intractable pain 01/18/2020   BPH (benign prostatic hyperplasia) 11/08/2019   Catheter-associated urinary tract infection ([place]) 09/14/2019   Drug induced constipation 07/18/2019   Goals of care, counseling/discussion 06/26/2019   Metastatic castration-resistant adenocarcinoma of prostate ([place]) 04/19/2019    Palliative Care Assessment & Plan   Patient Profile: 70 y.o. male  with past medical history of metastatic prostate cancer with obstructive uropathy requiring nephrostomy tube placement, recent CT with concern for malignant stricture and intra-abdominal metastatic disease, bilateral lower extremity DVT admitted on 11/27/2020 with abdominal pain with subsequent worsening and ex lap/ostomy performed urgently yesterday.  Palliative consulted for goals of care.  Recommendations/Plan: Full code/full scope Met with [name] in conjunction with his brother/HCPOA, Andre Glass.  He remains invested for continuation of aggressive interventions. Chart review reveals consideration for feeding tube if he is not able to maintain nutrition via his current intake.  He is reported to have been able to increase intake this morning and will continue to follow this closely.  Goals of Care and Additional Recommendations: Limitations on Scope of Treatment: Full Scope Treatment  Code Status:    Code Status Orders  (From admission, onward)           Start     Ordered   11/27/20 1638  Full code  Continuous        11/27/20 1640           Code Status History     Date Active Date Inactive Code Status Order ID Comments User Context   10/18/2020 0221 10/30/2020 2104 Full Code [id number]  Eppie Gibson, MD Inpatient   09/28/2020 2319 10/05/2020 1653 Full  Code [id number]  Elwyn Reach, MD ED   01/18/2020 1135 01/19/2020 2316 Full Code [id number]  Jonnie Finner, DO Inpatient   11/08/2019 1331 11/09/2019 1732 Full Code [id number]  [name], MD Inpatient      Advance Directive Documentation    Flowsheet Row Most Recent Value  Type of Advance Directive Healthcare Power of Attorney  Pre-existing out of facility DNR order (yellow form or pink MOST form) --  "MOST" Form in Place? --       Prognosis:  Guarded  Discharge Planning: To Be Determined  Care plan was discussed with patient, RN, Dr. Rodena Piety, brother  Thank you for allowing the Palliative Medicine Team to assist in the care of this patient.   Total Time 20 Prolonged Time Billed No  Greater than 50%  of this time was spent counseling and coordinating care related to the above assessment and plan.    Please contact Palliative Medicine Team phone at [phone] for questions and concerns.

## 2020-12-12 NOTE — Progress Notes (Signed)
Daily Progress Note   Patient Name: Andre Glass       Date: 12/12/2020 DOB: [birth year]  Age: 70 y.o. MRN#: [id number] Attending Physician: Georgette Shell, MD Primary Care Physician: [name], DO Admit Date: 11/27/2020  Reason for Consultation/Follow-up: Establishing goals of care  Subjective: I saw and examined Andre Glass today.    His brother, Andre Glass, was at the bedside as well.  Andre Glass was taking a few bites of dinner that his brother had brought in for him.  We discussed his clinical course since I last saw him and continued concerns about his nutrition and functional status.  He relays that he understands he has not been eating much, but he stated that food at the hospital is not to his liking.  He did have a few bites gone from the meal that was in front of him and states that he is still, "working on it."  He believes that his appetite will improve over the next day or 2 if he is allowed food that he enjoys rather than being limited to the hospital's menu.  We discussed concern that if he is not able to maintain his nutrition, recommendation if he continues to desire all aggressive interventions would be for a feeding tube.  We had a very limited discussion about burdens and benefits associated with this.  He would like to see how he does over the next day or so prior to further consideration.  Length of Stay: 15  Current Medications: Scheduled Meds:   (feeding supplement) PROSource Plus  30 mL Oral BID BM   acetaminophen (TYLENOL) oral liquid 160 mg/5 mL  650 mg Oral Q6H   vitamin C  1,000 mg Oral Daily   carvedilol  3.125 mg Oral BID WC   chlorhexidine  15 mL Mouth Rinse BID   Chlorhexidine Gluconate Cloth  6 each Topical Daily   furosemide  20 mg Intravenous BID    gabapentin  300 mg Oral BID   mouth rinse  15 mL Mouth Rinse q12n4p   methocarbamol  1,000 mg Oral TID   mirtazapine  7.5 mg Oral QHS   multivitamin with minerals  1 tablet Oral Daily   pantoprazole  40 mg Oral Daily   predniSONE  5 mg Oral Q breakfast   Ensure Max Protein  11 oz Oral BID   protein supplement shake  11 oz Oral TID    Continuous Infusions:  heparin 1,600 Units/hr (12/12/20 0055)   piperacillin-tazobactam (ZOSYN)  IV 3.375 g (12/12/20 0604)    PRN Meds: alum & mag hydroxide-simeth, HYDROmorphone (DILAUDID) injection, ondansetron **OR** ondansetron (ZOFRAN) IV, oxyCODONE, prochlorperazine, sodium chloride flush, tamsulosin  Physical Exam    General: Awake and alert.  He is still very weak, but his voice is stronger than it was when I saw him a week ago. HEENT: No bruits, no goiter, no JVD Heart: Regular rate and rhythm. No murmur appreciated. Lungs: Good air movement, clear Abdomen: Soft, globally tender, multiple drains  Ext: No significant edema Skin: Warm and dry       Vital Signs: BP 121/70 (BP Location: Left Arm)   Pulse 90   Temp 99.3 F (37.4 C) (Oral)   Resp 20   Ht 5\' 8"  (1.727 m)   Wt 89 kg   SpO2 98%   BMI 29.83 kg/m  SpO2: SpO2: 98 % O2 Device: O2 Device: Room Air O2 Flow Rate: O2 Flow Rate (L/min): 0 L/min  Intake/output summary:  Intake/Output Summary (Last 24 hours) at 12/12/2020 0836 Last data filed at 12/12/2020 0615 Gross per 24 hour  Intake 325.61 ml  Output 3530 ml  Net -3204.39 ml    LBM: Last BM Date: 12/10/20 Baseline Weight: Weight: 83.5 kg Most recent weight: Weight: 89 kg       Palliative Assessment/Data:    Flowsheet Rows    Flowsheet Row Most Recent Value  Intake Tab   Referral Department Hospitalist  Unit at Time of Referral Med/Surg Unit  Palliative Care Primary Diagnosis Sepsis/Infectious Disease  Date Notified 11/27/20  Palliative Care Type New Palliative care  Reason for referral Clarify Goals of Care   Date of Admission 11/27/20  # of days IP prior to Palliative referral 0  Clinical Assessment   Psychosocial & Spiritual Assessment   Palliative Care Outcomes        Patient Active Problem List   Diagnosis Date Noted   Stercoral perforation of colon s/p Hartmann colectomy/colostomy 11/28/2020 12/08/2020   Fecal peritonitis ([place]) 12/08/2020   Malnutrition of moderate degree 12/05/2020   Fecal impaction ([facility]) 11/27/2020   Hypokalemia 11/27/2020   Hypomagnesemia 11/27/2020   DVT of deep femoral vein, left (HCC) 11/27/2020   Abdominal pain    Severe protein-calorie malnutrition ([place]) 10/21/2020   Acute cystitis without hematuria    Anemia    Bacteremia due to Klebsiella pneumoniae    Altered mental status 10/18/2020   Myoclonus    Symptomatic anemia 10/17/2020   AKI (acute kidney injury) ([place]) 09/28/2020   Hydronephrosis 09/28/2020   Obstructive uropathy 09/28/2020   Intractable pain 01/18/2020   BPH (benign prostatic hyperplasia) 11/08/2019   Catheter-associated urinary tract infection ([place]) 09/14/2019   Drug induced constipation 07/18/2019   Goals of care, counseling/discussion 06/26/2019   Metastatic castration-resistant adenocarcinoma of prostate ([place]) 04/19/2019    Palliative Care Assessment & Plan   Patient Profile: 70 y.o. male  with past medical history of metastatic prostate cancer with obstructive uropathy requiring nephrostomy tube placement, recent CT with concern for malignant stricture and intra-abdominal metastatic disease, bilateral lower extremity DVT admitted on 11/27/2020 with abdominal pain with subsequent worsening and ex lap/ostomy performed urgently yesterday.  Palliative consulted for goals of care.  Recommendations/Plan: Full code/full scope I was able to speak today with Andre Glass in conjunction with his brother/HCPOA, Andre Glass.  He  remains invested for continuation of aggressive interventions. Chart review reveals consideration for feeding tube if he is  not able to maintain nutrition via his current intake.  We will continue to follow and discuss based upon his clinical course.  Goals of Care and Additional Recommendations: Limitations on Scope of Treatment: Full Scope Treatment  Code Status:    Code Status Orders  (From admission, onward)           Start     Ordered   11/27/20 1638  Full code  Continuous        11/27/20 1640           Code Status History     Date Active Date Inactive Code Status Order ID Comments User Context   10/18/2020 0221 10/30/2020 2104 Full Code [id number]  Eppie Gibson, MD Inpatient   09/28/2020 2319 10/05/2020 1653 Full Code [id number]  Elwyn Reach, MD ED   01/18/2020 1135 01/19/2020 2316 Full Code [id number]  Jonnie Finner, DO Inpatient   11/08/2019 1331 11/09/2019 1732 Full Code [id number]  [name], MD Inpatient      Advance Directive Documentation    Flowsheet Row Most Recent Value  Type of Advance Directive Healthcare Power of Attorney  Pre-existing out of facility DNR order (yellow form or pink MOST form) --  "MOST" Form in Place? --       Prognosis:  Guarded  Discharge Planning: To Be Determined  Care plan was discussed with patient, RN, Dr. Rodena Piety, brother  Thank you for allowing the Palliative Medicine Team to assist in the care of this patient.   Total Time 50 Prolonged Time Billed No   Greater than 50%  of this time was spent counseling and coordinating care related to the above assessment and plan.   Please contact Palliative Medicine Team phone at [phone] for questions and concerns.

## 2020-12-12 NOTE — Progress Notes (Signed)
Physical Therapy Treatment Patient Details Name: Andre Glass MRN: [id number] DOB: [birth year] Today's Date: 12/12/2020    History of Present Illness The pt is a 70 yo male presenting on 8/24 and admitted with possible malignant stricture was progressive and intra-abdominal metastatic disease, extensive osseous metastatic disease, evidence of bilateral lower extremity DVT and pelvic adenopathy. patient was also found to have fecal impaction with GI bleed impacting DVT treament.patient underwent ex lap, sigmoid colectomy,and end colostomy on 8/25.  PMH: reccently admitted for obstructive uropathy with bilateral hydronephrosis, s/p placement of percutaneous nephrostomy tubes bilaterally,UTI metastatic adenocarinoma of prostate, chornic pain, HTN, and CKD.    PT Comments    The patient is more alert and able to participate in bed mobility,Required max assist of 2 and use of bed pad with HOB raised to move to sitting on side of bed.  Once sitting , able to maintain balance, sitting for ~ 5 minutes.   Pulling up on recliner placed in front, Patient attempted to stand x 4, scooting along bed towards HOB. Patient complains of abdominal pain but not in the legs during mobility.  Patient making progress today. Will attempt standing/transfer to recliner next visit.   Follow Up Recommendations  SNF     Equipment Recommendations  None recommended by PT    Recommendations for Other Services       Precautions / Restrictions Precautions Precaution Comments: monitor O2,central line, soft spoken, nephrostomies bilateral, colostomy, painful right leg    Mobility  Bed Mobility               General bed mobility comments: Patient min assist to roll left and right with use of bed rails for linen change. Max assist x 1 for supine to sit with HOB near 90 degrees with patinet assisting with LE movement and trunk lift off. Max x 2 to return to supine    Transfers Overall transfer level: Needs  assistance Equipment used: None Transfers: Sit to/from Stand Sit to Stand: Mod assist;+2 physical assistance;+2 safety/equipment;From elevated surface         General transfer comment: Mod x 2 for four stands from elevated surface with patinet holding on to the back of the recliner to assist with pulling up. Patient unable to get into full erect standing. With stands made way to the head of the bed with mod x 2 and use of bed pad.  Ambulation/Gait                 Stairs             Wheelchair Mobility    Modified Rankin (Stroke Patients Only)       Balance Overall balance assessment: Needs assistance Sitting-balance support: Bilateral upper extremity supported   Sitting balance - Comments: Propped with UEs at eob   Standing balance support: During functional activity Standing balance-Leahy Scale: Poor                              Cognition Arousal/Alertness: Awake/alert Behavior During Therapy: WFL for tasks assessed/performed Overall Cognitive Status: Within Functional Limits for tasks assessed                                 General Comments: Patient continues to have very low voice. much more interactive.      Exercises Other Exercises Other Exercises: encouragement for UE use  during functional mobility, holding on to recliner to assis himeself, reaching and shoulder exercsies while in bed.    General Comments        Pertinent Vitals/Pain Pain Assessment: Faces Faces Pain Scale: Hurts little more Pain Location: abdomen, right nephrostomy tube site Pain Descriptors / Indicators: Discomfort;Grimacing;Sore Pain Intervention(s): Monitored during session;Premedicated before session;Repositioned    Home Living                      Prior Function            PT Goals (current goals can now be found in the care plan section) Acute Rehab PT Goals Patient Stated Goal: to stand and walk Progress towards PT goals:  Progressing toward goals    Frequency    Min 2X/week      PT Plan Current plan remains appropriate    Co-evaluation PT/OT/SLP Co-Evaluation/Treatment: Yes Reason for Co-Treatment: Complexity of the patient's impairments (multi-system involvement);For patient/therapist safety PT goals addressed during session: Mobility/safety with mobility OT goals addressed during session: ADL's and self-care      AM-PAC PT "6 Clicks" Mobility   Outcome Measure  Help needed turning from your back to your side while in a flat bed without using bedrails?: A Little Help needed moving from lying on your back to sitting on the side of a flat bed without using bedrails?: A Little Help needed moving to and from a bed to a chair (including a wheelchair)?: A Lot Help needed standing up from a chair using your arms (e.g., wheelchair or bedside chair)?: A Lot Help needed to walk in hospital room?: Total Help needed climbing 3-5 steps with a railing? : Total 6 Click Score: 12    End of Session   Activity Tolerance: Patient tolerated treatment well Patient left: in bed;with call bell/phone within reach;with bed alarm set;with family/visitor present Nurse Communication: Mobility status;Need for lift equipment PT Visit Diagnosis: Muscle weakness (generalized) (M62.81);Pain     Time: 1010-1036 PT Time Calculation (min) (ACUTE ONLY): 26 min  Charges:  $Therapeutic Activity: 8-22 mins                     Tresa Endo PT Acute Rehabilitation Services Pager [phone] Office [phone]    Claretha Cooper 12/12/2020, 2:48 PM

## 2020-12-12 NOTE — Progress Notes (Addendum)
Pt heparin level therapeutic, no signs of active bleeding @ 0100 when dressing change was done, @ 0300 specifically checked nephrostomy tubes for signs of active bleeding, Rt side tea color urine in cup vs pinkish color in toilet, hard to tell if old or new due to residuals in bag.  Clear yellow urine from left side.  No s/s of bleeding at incision site.  Will continue to monitor closely   0530 rt side nephrostomy appears to be more red than when checked at 0300, left side still yellow and clear, no other s/s of active bleeding.  Will hold off any actions for after hep gtt @ 1000 and will continue to monitor closely

## 2020-12-12 NOTE — TOC Progression Note (Signed)
Transition of Care [facility]) - Progression Note    Patient Details  Name: Andre Glass MRN: [id number] Date of Birth: [birth year]  Transition of Care [facility]) CM/SW Contact  Breana Litts, Juliann Pulse, RN Phone Number: 12/12/2020, 12:13 PM  Clinical Narrative: Noted recc for SNF-patient in agreement-will fax out for SNF once more medically stable-currently:JP drain,colostomy stoma site-necrotic-wound care,IV lasix,hep gtt,iv abx.      Expected Discharge Plan: Skilled Nursing Facility Barriers to Discharge: Continued Medical Work up  Expected Discharge Plan and Services Expected Discharge Plan: [place]   Discharge Planning Services: CM Consult   Living arrangements for the past 2 months: Single Family Home                                       Social Determinants of Health (SDOH) Interventions    Readmission Risk Interventions Readmission Risk Prevention Plan 10/02/2020  Transportation Screening Complete  PCP or Specialist Appt within 5-7 Days Complete  Home Care Screening Complete  Medication Review (RN CM) Complete  Some recent data might be hidden

## 2020-12-12 NOTE — Progress Notes (Addendum)
ANTICOAGULATION CONSULT NOTE   Pharmacy Consult for heparin Indication: recent bilateral DVT on 8/17 (home xarelto on hold)  Allergies  Allergen Reactions   Ibuprofen Anaphylaxis   Shrimp [Shellfish Allergy] Anaphylaxis   Patient Measurements:  Height: 5\' 8"  (172.7 cm) Weight: 89 kg (196 lb 3.4 oz) IBW/kg (Calculated) : 68.4 Heparin Dosing Weight: 87.8 kg  Vital Signs: Temp: 97.1 F (36.2 C) (09/07 2131) BP: 141/79 (09/07 2131) Pulse Rate: 90 (09/07 2131)  Labs: Recent Labs    12/09/20 0327 12/09/20 0327 12/10/20 0253 12/10/20 1146 12/10/20 2143 12/11/20 0438 12/12/20 0231  HGB 8.0*  --  8.2*  --   --  8.2* 7.9*  HCT 24.5*  --  26.4*  --   --  26.2* 25.2*  PLT 540*  --  569*  --   --  606* 831*  HEPARINUNFRC  --    < > 0.13* 0.76* 0.88*  --  0.40  CREATININE 0.80  --  0.78  --   --  0.80  --    < > = values in this interval not displayed.   Estimated Creatinine Clearance: 93.1 mL/min (by C-G formula based on SCr of 0.8 mg/dL).  Medications:  - on xarelto dose pack PTA (on initial 15mg  BID regimen - last dose taken on 11/26/20 at 1800)  Assessment: Patient is a 70 y.o M with metastatic prostate cancer currently undergoing chemotherapy treatment and on xarelto PTA for bilateral DVTs that was diagnosed on 11/20/20, presented to the ED on 11/27/20 with c/o abdominal pain. Surgery and GI consulted for fecal impaction. Pharmacy has been consulted to transition patient to heparin drip in case invasive intervention is needed with this admission. Baseline aPTT 34 sec, heparin level is elevated at 0.91--> is elevated d/t effect of xarelto  Patient taken to OR on 8/25 for sigmoid colectomy and end colostomy placement due to concern for large bowel perforation in setting of fecal impaction. Heparin was held and resumed multiple times during admission. Most recently held on 9/2 for hemoglobin of 5.8.    - 8/28 am Heparin held for bleeding noted from nephrostomy tubes and colostomy -  8/31 Heparin held at 0514 am for new midline bleed - 9/1 OK to resume Heparin per CCS, no bolus - 9/2 Heparin held for hgb of 5.8. - 9/5 OK to resume heparin per TRH, no bolus - 9/7 ok to resume heparin  Today, 12/12/2020: HL 0.4, therapeutic on 1600 units/hr Hgb 7.9, plts elevated Per RN tea colored urine from rt nephrostomy but normal from left. Hard to tell if right side is old blood or new blood as there is residual in the bag  Goal of Therapy:  Heparin level 0.3-0.7 units/ml Monitor platelets by anticoagulation protocol: Yes   Plan:  Continue heparin drip at 1600 units/hr Recheck heparin level 8 hours . Must request IV team consult to draw sample from line.  Daily CBC, heparin level  Monitor closely for s/sx of bleeding  F/u ability to transition back to [place] 12/12/2020, 2:56 AM

## 2020-12-12 NOTE — Progress Notes (Signed)
Occupational Therapy Treatment Patient Details Name: Andre Glass MRN: [id number] DOB: [birth year] Today's Date: 12/12/2020    History of present illness The pt is a 70 yo male presenting on 8/24 and admitted with possible malignant stricture was progressive and intra-abdominal metastatic disease, extensive osseous metastatic disease, evidence of bilateral lower extremity DVT and pelvic adenopathy. patient was also found to have fecal impaction with GI bleed impacting DVT treament.patient underwent ex lap, sigmoid colectomy,and end colostomy on 8/25.  PMH: reccently admitted for obstructive uropathy with bilateral hydronephrosis, s/p placement of percutaneous nephrostomy tubes bilaterally,UTI metastatic adenocarinoma of prostate, chornic pain, HTN, and CKD.   OT comments  Treatment focused on functional mobility and strengthening. Patient max assist to transfer to side of bed with HOB near 90 degrees. Patient mod x 2 to squat/stand x 4 with use of back of recliner. Unable to fully extend into erect standing. Patient performed reaching and functional use of upper extremities with propping and holding onto recliner. Encouraged and shown exercises for shoulders in supine. Patient making progress. Wants to work on standing and walking.    Follow Up Recommendations  SNF    Equipment Recommendations  Other (comment) (TBD)    Recommendations for Other Services      Precautions / Restrictions Precautions Precautions: Fall Precaution Comments: monitor O2,central line, soft spoken, nephrostomies bilateral, colostomy, painful right leg Restrictions Weight Bearing Restrictions: No       Mobility Bed Mobility Overal bed mobility: Needs Assistance Bed Mobility: Supine to Sit;Sit to Supine;Rolling Rolling: Min assist   Supine to sit: Max assist;HOB elevated Sit to supine: Max assist;+2 for physical assistance;+2 for safety/equipment   General bed mobility comments: Patient min assist to roll left  and right with use of bed rails for linen change. Max assist x 1 for supine to sit with HOB near 90 degrees with patinet assisting with LE movement and trunk lift off. Max x 2 to return to supine    Transfers Overall transfer level: Needs assistance Equipment used: None Transfers: Sit to/from Stand Sit to Stand: Mod assist;+2 physical assistance;+2 safety/equipment;From elevated surface         General transfer comment: Mod x 2 for four stands from elevated surface with patinet holding on to the back of the recliner to assist with pulling up. Patient unable to get into full erect standing. With stands made way to the head of the bed with mod x 2 and use of bed pad.    Balance Overall balance assessment: Needs assistance Sitting-balance support: Bilateral upper extremity supported   Sitting balance - Comments: Propped with UEs at eob   Standing balance support: During functional activity Standing balance-Leahy Scale: Poor                             ADL either performed or assessed with clinical judgement   ADL                                               Vision Patient Visual Report: No change from baseline     Perception     Praxis      Cognition Arousal/Alertness: Awake/alert Behavior During Therapy: WFL for tasks assessed/performed Overall Cognitive Status: Within Functional Limits for tasks assessed  Exercises Other Exercises Other Exercises: encouragement for UE use during functional mobility, holding on to recliner to assis himeself, reaching and shoulder exercsies while in bed.   Shoulder Instructions       General Comments      Pertinent Vitals/ Pain       Pain Assessment: Faces Faces Pain Scale: Hurts little more Pain Location: abdomen, right nephrostomy tube site Pain Descriptors / Indicators: Discomfort;Grimacing;Sore Pain Intervention(s): Limited activity  within patient's tolerance;Monitored during session;Premedicated before session  Home Living                                          Prior Functioning/Environment              Frequency  Min 2X/week        Progress Toward Goals  OT Goals(current goals can now be found in the care plan section)  Progress towards OT goals: Progressing toward goals  Acute Rehab OT Goals Patient Stated Goal: to stand and walk OT Goal Formulation: With patient Time For Goal Achievement: 12/18/20 Potential to Achieve Goals: [place] Discharge plan remains appropriate    Co-evaluation    PT/OT/SLP Co-Evaluation/Treatment: Yes Reason for Co-Treatment: Complexity of the patient's impairments (multi-system involvement);For patient/therapist safety;To address functional/ADL transfers   OT goals addressed during session: Strengthening/ROM      AM-PAC OT "6 Clicks" Daily Activity     Outcome Measure   Help from another person eating meals?: A Little Help from another person taking care of personal grooming?: A Lot Help from another person toileting, which includes using toliet, bedpan, or urinal?: Total Help from another person bathing (including washing, rinsing, drying)?: Total Help from another person to put on and taking off regular upper body clothing?: A Lot Help from another person to put on and taking off regular lower body clothing?: Total 6 Click Score: 10    End of Session Equipment Utilized During Treatment: Other (comment)  OT Visit Diagnosis: Muscle weakness (generalized) (M62.81);Pain   Activity Tolerance Patient tolerated treatment well   Patient Left in bed;with call bell/phone within reach;with family/visitor present;with bed alarm set   Nurse Communication Mobility status        Time: 1010-1039 OT Time Calculation (min): 29 min  Charges: OT General Charges $OT Visit: 1 Visit OT Treatments $Therapeutic Exercise: 8-22 mins  Derl Barrow,  OTR/L [place]  Office [phone] Pager: [place] 12/12/2020, 10:58 AM

## 2020-12-12 NOTE — Progress Notes (Signed)
PROGRESS NOTE    Andre Glass  GMW:[id number] DOB: Nov 04, 1950 DOA: 11/27/2020 PCP: [name], DO   Brief Narrative: 70 year old male with a history of metastatic prostate cancer, chronic bilateral nephrostomies due to obstructive uropathy from underlying cancer, was admitted to the hospital with right-sided abdominal pain.  Initial CT scan performed did not show any specific obstructive pathology in the bowel.  He was initially seen by GI and general surgery they recommended continue bowel regimen and continued observation.  CT also showed bilateral DVTs and he was started on anticoagulation.  The patient was receiving enemas and unfortunately developed perforation of colon with peritonitis.  He was emergently taken to the operating room and underwent laparotomy, partial colectomy and colostomy.  Biopsies from surgery confirmed metastatic prostate cancer.  He has a JP drain in place and there is concern for underlying abscess.  He is currently on IV antibiotics.  Postoperative recovery has been slow due to his multiple medical issues.  He was seen by both oncology and palliative care.  Patient wishes to continue with current treatments with hopes for improvement.  Assessment & Plan:   Principal Problem:   Stercoral perforation of colon s/p Hartmann colectomy/colostomy 11/28/2020 Active Problems:   Metastatic castration-resistant adenocarcinoma of prostate (HCC)   BPH (benign prostatic hyperplasia)   Symptomatic anemia   Fecal impaction (HCC)   Hypokalemia   Hypomagnesemia   DVT of deep femoral vein, left (HCC)   Malnutrition of moderate degree   Fecal peritonitis ([place])   #1 fecal impaction with acute on chronic constipation and stercoral ulcer with perforation resulting in peritonitis status post exploratory laparotomy colectomy and end colostomy on 11/28/2020 with by Dr. Windle Guard.  He presented with a 3-day history of fecal impaction in the setting of prostate cancer and narcotics at home.   He did not respond to stool softeners and laxatives. GI consulted recommended enemas Patient subsequently became hypotensive with some guarding and rigidity of his abdomen with diaphoresis.  Stat KUB showed possible free air.  General surgery was reconsulted who took him to the OR on an emergent basis.  He underwent exploratory laparotomy sigmoid colectomy and end colostomy.  Surgical pathology showed metastatic prostate cancer. Palliative care care has been consulted.  #2 Acute b/l lower extremity DVT -patient continues to have active bleeding and right nephrostomy tubes with hematuria.  With active bleeding and poor prognosis will DC heparin.    Discussed with Dr. [name].  #3 metastatic prostate cancer oncology has been consulted with no  new recommendations currently.  #4 acute on chronic anemia he received 4 units of packed RBCs during his hospital stay.  We will need to watch closely on heparin with hematuria.  #5 bilateral nephrostomy tubes with hematuria monitor.  Daily.  #6 moderate malnutrition-his p.o. intake has been poor on dysphagia 3 diet.  Staff reports that patient had 100% breakfast this morning.   Nutrition Problem: Moderate Malnutrition Etiology: chronic illness, cancer and cancer related treatments     Signs/Symptoms: mild fat depletion, mild muscle depletion, moderate muscle depletion    Interventions: Prostat, Premier Protein, MVI  Estimated body mass index is 29.83 kg/m as calculated from the following:   Height as of this encounter: 5\' 8"  (1.727 m).   Weight as of this encounter: 89 kg.  DVT prophylaxis: None due to ongoing bleeding requiring blood transfusion code Status: Full code  family Communication: None at bedside  Disposition Plan:  Status is: Inpatient  Remains inpatient appropriate because:Hemodynamically unstable  Dispo: The patient is from: Home              Anticipated d/c is to: SNF              Patient currently is not medically  stable to d/c.   Difficult to place patient Yes       Consultants:  General surgery, GI, urology, oncology, palliative care  Procedures: Exploratory laparotomy sigmoid colectomy and end colostomy as an emergent procedure Antimicrobials:  Anti-infectives (From admission, onward)    Start     Dose/Rate Route Frequency Ordered Stop   11/28/20 2230  piperacillin-tazobactam (ZOSYN) IVPB 3.375 g        3.375 g 12.5 mL/hr over 240 Minutes Intravenous Every 8 hours 11/28/20 2121     11/28/20 2215  piperacillin-tazobactam (ZOSYN) IVPB 3.375 g  Status:  Discontinued       Note to Pharmacy: Given Intraoperative   3.375 g 100 mL/hr over 30 Minutes Intravenous Every 8 hours 11/28/20 2117 11/28/20 2121   11/28/20 1730  piperacillin-tazobactam (ZOSYN) IVPB 3.375 g       Note to Pharmacy: Given Intraoperative   3.375 g 100 mL/hr over 30 Minutes Intravenous  Once 11/28/20 1729 11/28/20 1750   11/28/20 1712  piperacillin-tazobactam (ZOSYN) 3.375 GM/50ML IVPB       Note to Pharmacy: Marchia Meiers   : cabinet override      11/28/20 1712 11/28/20 1735   11/28/20 1545  ceFAZolin (ANCEF) IVPB 2g/100 mL premix        2 g 200 mL/hr over 30 Minutes Intravenous  Once 11/28/20 1538 11/28/20 1630   11/28/20 1539  ceFAZolin (ANCEF) 2-4 GM/100ML-% IVPB       Note to Pharmacy: Minor, Anneita   : cabinet override      11/28/20 1539 11/28/20 1654   11/27/20 1700  cephALEXin (KEFLEX) capsule 500 mg  Status:  Discontinued        500 mg Oral 3 times daily 11/27/20 1640 11/28/20 2117        Subjective:  Patient resting in bed a little bit more awake than yesterday  Staff reported that he ate 100% of his breakfast Core track tube placement is being held to see if he can improve his p.o. intake Continues to have hematuria from the right nephrostomy tube with fresh bleeding on heparin drip  objective: Vitals:   12/11/20 1238 12/11/20 2131 12/12/20 0523 12/12/20 1206  BP: 135/70 (!) 141/79 121/70 126/64   Pulse: 93 90 90 84  Resp: (!) 22 19 20    Temp: 98.4 F (36.9 C) (!) 97.1 F (36.2 C) 99.3 F (37.4 C) 98.2 F (36.8 C)  TempSrc: Oral  Oral Oral  SpO2: 100% 99% 98% 100%  Weight:      Height:        Intake/Output Summary (Last 24 hours) at 12/12/2020 1307 Last data filed at 12/12/2020 0925 Gross per 24 hour  Intake 445.61 ml  Output 2130 ml  Net -1684.39 ml    Filed Weights   12/09/20 0500 12/10/20 0433 12/11/20 0548  Weight: 93 kg 86.6 kg 89 kg    Examination:  General exam: Appears calm and comfortable  Respiratory system: Clear to auscultation. Respiratory effort normal. Cardiovascular system: S1 & S2 heard, RRR. No JVD, murmurs, rubs, gallops or clicks. No pedal edema. Gastrointestinal system: Abdomen is nondistended, soft and nontender. No organomegaly or masses felt. Normal bowel sounds heard. Central nervous system: Alert and oriented. No focal neurological deficits.  Extremities: 2+ edema Skin: No rashes, lesions or ulcers Psychiatry: Judgement and insight appear normal. Mood & affect appropriate.     Data Reviewed: I have personally reviewed following labs and imaging studies  CBC: Recent Labs  Lab 12/06/20 1039 12/06/20 2311 12/08/20 0321 12/09/20 0327 12/10/20 0253 12/11/20 0438 12/12/20 0231  WBC 32.3*   < > 23.4* 21.2* 19.6* 20.2* 21.1*  NEUTROABS 31.3*  --   --   --   --   --   --   HGB 5.8*   < > 8.3* 8.0* 8.2* 8.2* 7.9*  HCT 18.7*   < > 26.2* 24.5* 26.4* 26.2* 25.2*  MCV 96.9   < > 92.6 92.8 96.7 96.3 96.6  PLT 398   < > 464* 540* 569* 606* 831*   < > = values in this interval not displayed.    Basic Metabolic Panel: Recent Labs  Lab 12/06/20 1039 12/07/20 0454 12/08/20 0321 12/09/20 0327 12/10/20 0253 12/11/20 0438  NA 139 139 138 142 140 141  K 3.3* 3.4* 3.6 3.2* 3.8 4.4  CL 113* 114* 111 113* 110 113*  CO2 18* 22 21* 23 22 23   GLUCOSE 98 95 90 98 96 96  BUN 21 25* 24* 24* 25* 18  CREATININE 0.85 0.89 0.87 0.80 0.78 0.80   CALCIUM 7.8* 8.1* 8.1* 8.6* 8.5* 8.7*  MG  --   --  1.8  --  1.8 2.0  PHOS 3.4 2.7 3.3 2.8 2.9  --     GFR: Estimated Creatinine Clearance: 93.1 mL/min (by C-G formula based on SCr of 0.8 mg/dL). Liver Function Tests: Recent Labs  Lab 12/06/20 1039 12/07/20 0454 12/08/20 0321 12/09/20 0327 12/10/20 0253  ALBUMIN 2.3* 2.2* 2.0* 2.4* 2.1*    No results for input(s): LIPASE, AMYLASE in the last 168 hours. No results for input(s): AMMONIA in the last 168 hours. Coagulation Profile: No results for input(s): INR, PROTIME in the last 168 hours. Cardiac Enzymes: No results for input(s): CKTOTAL, CKMB, CKMBINDEX, TROPONINI in the last 168 hours. BNP (last 3 results) No results for input(s): PROBNP in the last 8760 hours. HbA1C: No results for input(s): HGBA1C in the last 72 hours. CBG: No results for input(s): GLUCAP in the last 168 hours. Lipid Profile: No results for input(s): CHOL, HDL, LDLCALC, TRIG, CHOLHDL, LDLDIRECT in the last 72 hours. Thyroid Function Tests: No results for input(s): TSH, T4TOTAL, FREET4, T3FREE, THYROIDAB in the last 72 hours. Anemia Panel: No results for input(s): VITAMINB12, FOLATE, FERRITIN, TIBC, IRON, RETICCTPCT in the last 72 hours. Sepsis Labs: No results for input(s): PROCALCITON, LATICACIDVEN in the last 168 hours.  Recent Results (from the past 240 hour(s))  Culture, blood (routine x 2)     Status: Abnormal (Preliminary result)   Collection Time: 12/05/20  1:51 PM   Specimen: BLOOD  Result Value Ref Range Status   Specimen Description   Final    BLOOD RIGHT ANTECUBITAL Performed at [facility] [address] [place], [place] [address]    Special Requests   Final    BOTTLES DRAWN AEROBIC AND ANAEROBIC Blood Culture adequate volume Performed at [facility] [address] [place], [place] [address]    Culture  Setup Time (A)  Final    GRAM VARIABLE ROD ANAEROBIC BOTTLE ONLY CRITICAL RESULT  CALLED TO, READ BACK BY AND VERIFIED WITH: L POINDEXTER,[email or web address]  12/08/20 [place]    Culture (A)  Final    GRAM VARIABLE ROD CULTURE REINCUBATED FOR  BETTER GROWTH Performed at [facility], [place] [address], [place], [place] [address]    Report Status PENDING  Incomplete  Blood Culture ID Panel (Reflexed)     Status: None   Collection Time: 12/05/20  1:51 PM  Result Value Ref Range Status   Enterococcus faecalis NOT DETECTED NOT DETECTED Final   Enterococcus Faecium NOT DETECTED NOT DETECTED Final   Listeria monocytogenes NOT DETECTED NOT DETECTED Final   Staphylococcus species NOT DETECTED NOT DETECTED Final   Staphylococcus aureus (BCID) NOT DETECTED NOT DETECTED Final   Staphylococcus epidermidis NOT DETECTED NOT DETECTED Final   Staphylococcus lugdunensis NOT DETECTED NOT DETECTED Final   Streptococcus species NOT DETECTED NOT DETECTED Final   Streptococcus agalactiae NOT DETECTED NOT DETECTED Final   Streptococcus pneumoniae NOT DETECTED NOT DETECTED Final   Streptococcus pyogenes NOT DETECTED NOT DETECTED Final   A.calcoaceticus-baumannii NOT DETECTED NOT DETECTED Final   Bacteroides fragilis NOT DETECTED NOT DETECTED Final   Enterobacterales NOT DETECTED NOT DETECTED Final   Enterobacter cloacae complex NOT DETECTED NOT DETECTED Final   Escherichia coli NOT DETECTED NOT DETECTED Final   Klebsiella aerogenes NOT DETECTED NOT DETECTED Final   Klebsiella oxytoca NOT DETECTED NOT DETECTED Final   Klebsiella pneumoniae NOT DETECTED NOT DETECTED Final   Proteus species NOT DETECTED NOT DETECTED Final   Salmonella species NOT DETECTED NOT DETECTED Final   Serratia marcescens NOT DETECTED NOT DETECTED Final   Haemophilus influenzae NOT DETECTED NOT DETECTED Final   Neisseria meningitidis NOT DETECTED NOT DETECTED Final   Pseudomonas aeruginosa NOT DETECTED NOT DETECTED Final   Stenotrophomonas maltophilia NOT DETECTED NOT DETECTED Final   Candida albicans NOT DETECTED NOT  DETECTED Final   Candida auris NOT DETECTED NOT DETECTED Final   Candida glabrata NOT DETECTED NOT DETECTED Final   Candida krusei NOT DETECTED NOT DETECTED Final   Candida parapsilosis NOT DETECTED NOT DETECTED Final   Candida tropicalis NOT DETECTED NOT DETECTED Final   Cryptococcus neoformans/gattii NOT DETECTED NOT DETECTED Final    Comment: Performed at [facility], [address], [place], [place] [address]  Culture, blood (routine x 2)     Status: None   Collection Time: 12/05/20  1:59 PM   Specimen: BLOOD RIGHT FOREARM  Result Value Ref Range Status   Specimen Description   Final    BLOOD RIGHT FOREARM Performed at [facility] [address] [place], [place] [address]    Special Requests   Final    BOTTLES DRAWN AEROBIC AND ANAEROBIC Blood Culture adequate volume Performed at [facility] [address], [place], [place] [address]    Culture   Final    NO GROWTH 5 DAYS Performed at [facility], [place] [address], [place], [place] [address]    Report Status 12/10/2020 FINAL  Final  Urine Culture     Status: Abnormal   Collection Time: 12/05/20  3:52 PM   Specimen: Urine, Random  Result Value Ref Range Status   Specimen Description   Final    URINE, RANDOM NEPHROSTOMY Performed at [facility] [address], [place], [place] [address]    Special Requests   Final    NONE Performed at [facility], [place] [address] [place], [place] [address]    Culture 10,000 COLONIES/mL YEAST (A)  Final   Report Status 12/07/2020 FINAL  Final  Aerobic Culture w Gram Stain (superficial specimen)  Status: None   Collection Time: 12/05/20  4:25 PM   Specimen: JP Drain  Result Value Ref Range Status   Specimen Description   Final    JP DRAINAGE Performed at [facility] [address], [place], [place] [address]    Special Requests   Final    NONE Performed at [facility], [place] [address] [place], [place] [address]    Gram Stain   Final    NO SQUAMOUS EPITHELIAL CELLS PRESENT ABUNDANT WBC PRESENT,BOTH PMN AND MONONUCLEAR ABUNDANT GRAM NEGATIVE RODS Performed at [facility], [place] [address], [place], [place] [address]    Culture   Final    ABUNDANT ESCHERICHIA COLI MODERATE PSEUDOMONAS AERUGINOSA    Report Status 12/08/2020 FINAL  Final   Organism ID, Bacteria ESCHERICHIA COLI  Final   Organism ID, Bacteria PSEUDOMONAS AERUGINOSA  Final      Susceptibility   Escherichia coli - MIC*    AMPICILLIN >=32 RESISTANT Resistant     CEFAZOLIN <=4 SENSITIVE Sensitive     CEFEPIME <=0.12 SENSITIVE Sensitive     CEFTAZIDIME <=1 SENSITIVE Sensitive     CEFTRIAXONE <=0.25 SENSITIVE Sensitive     CIPROFLOXACIN <=0.25 SENSITIVE Sensitive     GENTAMICIN <=1 SENSITIVE Sensitive     IMIPENEM <=0.25 SENSITIVE Sensitive     TRIMETH/SULFA >=320 RESISTANT Resistant     AMPICILLIN/SULBACTAM >=32 RESISTANT Resistant     PIP/TAZO <=4 SENSITIVE Sensitive     * ABUNDANT ESCHERICHIA COLI   Pseudomonas aeruginosa - MIC*    CEFTAZIDIME 4 SENSITIVE Sensitive     CIPROFLOXACIN <=0.25 SENSITIVE Sensitive     GENTAMICIN <=1 SENSITIVE Sensitive     IMIPENEM 2 SENSITIVE Sensitive     PIP/TAZO <=4 SENSITIVE Sensitive     CEFEPIME 2 SENSITIVE Sensitive     * MODERATE PSEUDOMONAS AERUGINOSA          Radiology Studies: No results found.      Scheduled Meds:  (feeding supplement) PROSource Plus  30 mL Oral BID BM   acetaminophen (TYLENOL) oral liquid 160 mg/5 mL  650 mg Oral Q6H   vitamin C  1,000 mg Oral Daily   carvedilol  3.125 mg Oral BID WC   chlorhexidine  15 mL Mouth Rinse BID   Chlorhexidine Gluconate Cloth  6 each Topical Daily   furosemide  20 mg Intravenous BID   gabapentin  300 mg Oral BID   iohexol  500 mL Oral Q1H   iohexol       mouth rinse  15 mL Mouth Rinse q12n4p   methocarbamol  1,000 mg Oral TID   mirtazapine   7.5 mg Oral QHS   multivitamin with minerals  1 tablet Oral Daily   pantoprazole  40 mg Oral Daily   predniSONE  5 mg Oral Q breakfast   Ensure Max Protein  11 oz Oral BID   protein supplement shake  11 oz Oral TID   Continuous Infusions:  heparin 1,600 Units/hr (12/12/20 0055)   piperacillin-tazobactam (ZOSYN)  IV 3.375 g (12/12/20 0604)     LOS: 15 days    Time spent: 43 min   Georgette Shell, MD 12/12/2020, 1:07 PM

## 2020-12-13 ENCOUNTER — Encounter (HOSPITAL_COMMUNITY): Payer: Self-pay | Admitting: Family Medicine

## 2020-12-13 ENCOUNTER — Inpatient Hospital Stay (HOSPITAL_COMMUNITY): Payer: Medicare Other

## 2020-12-13 LAB — COMPREHENSIVE METABOLIC PANEL
ALT: 12 U/L (ref 0–44)
AST: 27 U/L (ref 15–41)
Albumin: 1.9 g/dL — ABNORMAL LOW (ref 3.5–5.0)
Alkaline Phosphatase: 196 U/L — ABNORMAL HIGH (ref 38–126)
Anion gap: 9 (ref 5–15)
BUN: 19 mg/dL (ref 8–23)
CO2: 24 mmol/L (ref 22–32)
Calcium: 8.8 mg/dL — ABNORMAL LOW (ref 8.9–10.3)
Chloride: 105 mmol/L (ref 98–111)
Creatinine, Ser: 0.84 mg/dL (ref 0.61–1.24)
GFR, Estimated: >60 mL/min (ref >60)
Glucose, Bld: 85 mg/dL (ref 70–99)
Potassium: 3.8 mmol/L (ref 3.5–5.1)
Sodium: 138 mmol/L (ref 135–145)
Total Bilirubin: 0.6 mg/dL (ref 0.3–1.2)
Total Protein: 6.9 g/dL (ref 6.5–8.1)

## 2020-12-13 LAB — CBC
HCT: 25.5 % — ABNORMAL LOW (ref 39.0–52.0)
Hemoglobin: 8 g/dL — ABNORMAL LOW (ref 13.0–17.0)
MCH: 30.2 pg (ref 26.0–34.0)
MCHC: 31.4 g/dL (ref 30.0–36.0)
MCV: 96.2 fL (ref 80.0–100.0)
Platelets: 576 10*3/uL — ABNORMAL HIGH (ref 150–400)
RBC: 2.65 MIL/uL — ABNORMAL LOW (ref 4.22–5.81)
RDW: 19.8 % — ABNORMAL HIGH (ref 11.5–15.5)
WBC: 20.6 10*3/uL — ABNORMAL HIGH (ref 4.0–10.5)
nRBC: 0 % (ref 0.0–0.2)

## 2020-12-13 LAB — PREALBUMIN: Prealbumin: 9.9 mg/dL — ABNORMAL LOW (ref 18–38)

## 2020-12-13 LAB — PROTIME-INR
INR: 1.3 — ABNORMAL HIGH (ref 0.8–1.2)
Prothrombin Time: 16.1 seconds — ABNORMAL HIGH (ref 11.4–15.2)

## 2020-12-13 MED ORDER — MIDAZOLAM HCL 2 MG/2ML IJ SOLN
INTRAMUSCULAR | Status: AC
  Filled 2020-12-13: qty 4

## 2020-12-13 MED ORDER — FENTANYL CITRATE (PF) 100 MCG/2ML IJ SOLN
INTRAMUSCULAR | Status: AC
  Filled 2020-12-13: qty 4

## 2020-12-13 MED ORDER — SODIUM CHLORIDE 0.9 % IV SOLN
INTRAVENOUS | Status: AC
  Filled 2020-12-13: qty 250

## 2020-12-13 MED ORDER — MIDAZOLAM HCL 2 MG/2ML IJ SOLN
INTRAMUSCULAR | Status: DC | PRN
  Administered 2020-12-13: 1 mg via INTRAVENOUS

## 2020-12-13 MED ORDER — LIDOCAINE HCL (PF) 1 % IJ SOLN
INTRAMUSCULAR | Status: DC | PRN
  Administered 2020-12-13: 10 mL via INTRADERMAL

## 2020-12-13 MED ORDER — FENTANYL CITRATE (PF) 100 MCG/2ML IJ SOLN
INTRAMUSCULAR | Status: DC | PRN
  Administered 2020-12-13: 50 ug via INTRAVENOUS

## 2020-12-13 MED ORDER — FLUMAZENIL 0.5 MG/5ML IV SOLN
INTRAVENOUS | Status: AC
  Filled 2020-12-13: qty 5

## 2020-12-13 MED ORDER — SODIUM CHLORIDE 0.9% FLUSH
5.0000 mL | Freq: Three times a day (TID) | INTRAVENOUS | Status: DC
  Administered 2020-12-13 – 2020-12-19 (×17): 5 mL

## 2020-12-13 MED ORDER — NALOXONE HCL 0.4 MG/ML IJ SOLN
INTRAMUSCULAR | Status: AC
  Filled 2020-12-13: qty 1

## 2020-12-13 MED ORDER — DAKINS (1/4 STRENGTH) 0.125 % EX SOLN
Freq: Three times a day (TID) | CUTANEOUS | Status: AC
  Administered 2020-12-16: 1
  Filled 2020-12-13 (×2): qty 473

## 2020-12-13 NOTE — Progress Notes (Signed)
PROGRESS NOTE    Andre Glass  PYK:[id number] DOB: Aug 27, 1950 DOA: 11/27/2020 PCP: [name], DO   Brief Narrative: 70 year old male with a history of metastatic prostate cancer, chronic bilateral nephrostomies due to obstructive uropathy from underlying cancer, was admitted to the hospital with right-sided abdominal pain.  Initial CT scan performed did not show any specific obstructive pathology in the bowel.  He was initially seen by GI and general surgery they recommended continue bowel regimen and continued observation.  CT also showed bilateral DVTs and he was started on anticoagulation.  The patient was receiving enemas and unfortunately developed perforation of colon with peritonitis.  He was emergently taken to the operating room and underwent laparotomy, partial colectomy and colostomy.  Biopsies from surgery confirmed metastatic prostate cancer.  He has a JP drain in place and there is concern for underlying abscess.  He is currently on IV antibiotics.  Postoperative recovery has been slow due to his multiple medical issues.  He was seen by both oncology and palliative care.  Patient wishes to continue with current treatments with hopes for improvement.  Assessment & Plan:   Principal Problem:   Stercoral perforation of colon s/p Hartmann colectomy/colostomy 11/28/2020 Active Problems:   Metastatic castration-resistant adenocarcinoma of prostate (HCC)   BPH (benign prostatic hyperplasia)   Symptomatic anemia   Fecal impaction (HCC)   Hypokalemia   Hypomagnesemia   DVT of deep femoral vein, left (HCC)   Malnutrition of moderate degree   Fecal peritonitis ([place])   #1 fecal impaction with acute on chronic constipation and stercoral ulcer with perforation resulting in peritonitis status post exploratory laparotomy colectomy and end colostomy on 11/28/2020 with by Dr. Windle Guard.  He presented with a 3-day history of fecal impaction in the setting of prostate cancer and narcotics at home.   He did not respond to stool softeners and laxatives. GI consulted recommended enemas Patient subsequently became hypotensive with some guarding and rigidity of his abdomen with diaphoresis.  Stat KUB showed possible free air.  General surgery was reconsulted who took him to the OR on an emergent basis.  He underwent exploratory laparotomy sigmoid colectomy and end colostomy.  Surgical pathology showed metastatic prostate cancer. Palliative care care has been consulted patient remains full code and all aggressive measures to be taken.  #2 Acute b/l lower extremity DVT -patient continues to have active bleeding and right nephrostomy tubes with hematuria.  With active bleeding and poor prognosis will DC heparin.    Discussed with Dr. [name].  #3 metastatic prostate cancer oncology has been consulted with no  new recommendations currently.  #4 acute on chronic anemia he received 4 units of packed RBCs during his hospital stay.  We will need to watch closely on heparin with hematuria.  #5 bilateral nephrostomy tubes with hematuria monitor.  Daily.  #6 moderate malnutrition-his p.o. intake has been poor on dysphagia 3 diet.  Staff reports that patient had 100% breakfast this morning.   Nutrition Problem: Moderate Malnutrition Etiology: chronic illness, cancer and cancer related treatments     Signs/Symptoms: mild fat depletion, mild muscle depletion, moderate muscle depletion    Interventions: Prostat, Premier Protein, MVI  Estimated body mass index is 27.29 kg/m as calculated from the following:   Height as of this encounter: 5\' 8"  (1.727 m).   Weight as of this encounter: 81.4 kg.  DVT prophylaxis: None due to ongoing bleeding requiring blood transfusion code Status: Full code  family Communication: None at bedside  Disposition Plan:  Status is: Inpatient  Remains inpatient appropriate because:Hemodynamically unstable   Dispo: The patient is from: Home              Anticipated  d/c is to: SNF              Patient currently is not medically stable to d/c.   Difficult to place patient Yes       Consultants:  General surgery, GI, urology, oncology, palliative care  Procedures: Exploratory laparotomy sigmoid colectomy and end colostomy as an emergent procedure Antimicrobials:  Anti-infectives (From admission, onward)    Start     Dose/Rate Route Frequency Ordered Stop   11/28/20 2230  piperacillin-tazobactam (ZOSYN) IVPB 3.375 g        3.375 g 12.5 mL/hr over 240 Minutes Intravenous Every 8 hours 11/28/20 2121     11/28/20 2215  piperacillin-tazobactam (ZOSYN) IVPB 3.375 g  Status:  Discontinued       Note to Pharmacy: Given Intraoperative   3.375 g 100 mL/hr over 30 Minutes Intravenous Every 8 hours 11/28/20 2117 11/28/20 2121   11/28/20 1730  piperacillin-tazobactam (ZOSYN) IVPB 3.375 g       Note to Pharmacy: Given Intraoperative   3.375 g 100 mL/hr over 30 Minutes Intravenous  Once 11/28/20 1729 11/28/20 1750   11/28/20 1712  piperacillin-tazobactam (ZOSYN) 3.375 GM/50ML IVPB       Note to Pharmacy: Marchia Meiers   : cabinet override      11/28/20 1712 11/28/20 1735   11/28/20 1545  ceFAZolin (ANCEF) IVPB 2g/100 mL premix        2 g 200 mL/hr over 30 Minutes Intravenous  Once 11/28/20 1538 11/28/20 1630   11/28/20 1539  ceFAZolin (ANCEF) 2-4 GM/100ML-% IVPB       Note to Pharmacy: Minor, Anneita   : cabinet override      11/28/20 1539 11/28/20 1654   11/27/20 1700  cephALEXin (KEFLEX) capsule 500 mg  Status:  Discontinued        500 mg Oral 3 times daily 11/27/20 1640 11/28/20 2117        Subjective: Patient resting in bed appears very weak and tired and exhausted.  Status post CT-guided placement of drainage catheter into the left lower quadrant intra-abdominal peristomal abscess he reports he is eating better Heparin was stopped 12/12/2020 hematuria has cleared up no further bleeding noted from the abdominal dressings CT abdomen 12/12/2020 with  new abdominal pelvic gas and fluid collection located in the left anterior abdomen demonstrating a continue to with the pelvic fluid collection most consistent with an abscess.  Multiple walled off fluid collections throughout the abdomen and pelvis.  Bilateral nephrostomy tubes in position with pigtails in the renal pelvis and urinary bladder with bilateral double-J ureteral stents remain in position no hydronephrosis  objective: Vitals:   12/13/20 1045 12/13/20 1050 12/13/20 1055 12/13/20 1318  BP: 118/61 124/61 122/66 115/67  Pulse: 98 92 95 96  Resp: 20 20 20    Temp:      TempSrc:      SpO2: 100% 100% 100% 98%  Weight:      Height:        Intake/Output Summary (Last 24 hours) at 12/13/2020 1349 Last data filed at 12/13/2020 1156 Gross per 24 hour  Intake 463.07 ml  Output 2691 ml  Net -2227.93 ml    Filed Weights   12/10/20 0433 12/11/20 0548 12/13/20 0423  Weight: 86.6 kg 89 kg 81.4 kg    Examination:  General exam: Appears calm and comfortable  Respiratory system: Clear to auscultation. Respiratory effort normal. Cardiovascular system: S1 & S2 heard, RRR. No JVD, murmurs, rubs, gallops or clicks. No pedal edema. Gastrointestinal system: Abdomen is nondistended, soft and nontender. No organomegaly or masses felt. Normal bowel sounds heard.  And covered with dressing.  Bilateral nephrostomy tubes, colostomy with liquid stool and gas, JP drain in place Central nervous system: Alert and oriented. No focal neurological deficits. Extremities: 2+ edema Skin: No rashes, lesions or ulcers Psychiatry: Judgement and insight appear normal. Mood & affect appropriate.     Data Reviewed: I have personally reviewed following labs and imaging studies  CBC: Recent Labs  Lab 12/09/20 0327 12/10/20 0253 12/11/20 0438 12/12/20 0231 12/13/20 0403  WBC 21.2* 19.6* 20.2* 21.1* 20.6*  HGB 8.0* 8.2* 8.2* 7.9* 8.0*  HCT 24.5* 26.4* 26.2* 25.2* 25.5*  MCV 92.8 96.7 96.3 96.6 96.2  PLT  540* 569* 606* 831* 576*    Basic Metabolic Panel: Recent Labs  Lab 12/07/20 0454 12/08/20 0321 12/09/20 0327 12/10/20 0253 12/11/20 0438 12/13/20 0403  NA 139 138 142 140 141 138  K 3.4* 3.6 3.2* 3.8 4.4 3.8  CL 114* 111 113* 110 113* 105  CO2 22 21* 23 22 23 24   GLUCOSE 95 90 98 96 96 85  BUN 25* 24* 24* 25* 18 19  CREATININE 0.89 0.87 0.80 0.78 0.80 0.84  CALCIUM 8.1* 8.1* 8.6* 8.5* 8.7* 8.8*  MG  --  1.8  --  1.8 2.0  --   PHOS 2.7 3.3 2.8 2.9  --   --     GFR: Estimated Creatinine Clearance: 79.2 mL/min (by C-G formula based on SCr of 0.84 mg/dL). Liver Function Tests: Recent Labs  Lab 12/07/20 0454 12/08/20 0321 12/09/20 0327 12/10/20 0253 12/13/20 0403  AST  --   --   --   --  27  ALT  --   --   --   --  12  ALKPHOS  --   --   --   --  196*  BILITOT  --   --   --   --  0.6  PROT  --   --   --   --  6.9  ALBUMIN 2.2* 2.0* 2.4* 2.1* 1.9*    No results for input(s): LIPASE, AMYLASE in the last 168 hours. No results for input(s): AMMONIA in the last 168 hours. Coagulation Profile: Recent Labs  Lab 12/13/20 0403  INR 1.3*   Cardiac Enzymes: No results for input(s): CKTOTAL, CKMB, CKMBINDEX, TROPONINI in the last 168 hours. BNP (last 3 results) No results for input(s): PROBNP in the last 8760 hours. HbA1C: No results for input(s): HGBA1C in the last 72 hours. CBG: No results for input(s): GLUCAP in the last 168 hours. Lipid Profile: No results for input(s): CHOL, HDL, LDLCALC, TRIG, CHOLHDL, LDLDIRECT in the last 72 hours. Thyroid Function Tests: No results for input(s): TSH, T4TOTAL, FREET4, T3FREE, THYROIDAB in the last 72 hours. Anemia Panel: No results for input(s): VITAMINB12, FOLATE, FERRITIN, TIBC, IRON, RETICCTPCT in the last 72 hours. Sepsis Labs: No results for input(s): PROCALCITON, LATICACIDVEN in the last 168 hours.  Recent Results (from the past 240 hour(s))  Culture, blood (routine x 2)     Status: Abnormal (Preliminary result)    Collection Time: 12/05/20  1:51 PM   Specimen: BLOOD  Result Value Ref Range Status   Specimen Description   Final    BLOOD RIGHT ANTECUBITAL Performed  at [facility], [place] [address] [place], [place] [address]    Special Requests   Final    BOTTLES DRAWN AEROBIC AND ANAEROBIC Blood Culture adequate volume Performed at [facility] [address], [place], [place] [address]    Culture  Setup Time (A)  Final    GRAM VARIABLE ROD ANAEROBIC BOTTLE ONLY CRITICAL RESULT CALLED TO, READ BACK BY AND VERIFIED WITH: L POINDEXTER,[email or web address]  12/08/20 [place]    Culture (A)  Final    GRAM VARIABLE ROD CULTURE REINCUBATED FOR BETTER GROWTH Performed at [facility], [place] [address], [place], [place] [address]    Report Status PENDING  Incomplete  Blood Culture ID Panel (Reflexed)     Status: None   Collection Time: 12/05/20  1:51 PM  Result Value Ref Range Status   Enterococcus faecalis NOT DETECTED NOT DETECTED Final   Enterococcus Faecium NOT DETECTED NOT DETECTED Final   Listeria monocytogenes NOT DETECTED NOT DETECTED Final   Staphylococcus species NOT DETECTED NOT DETECTED Final   Staphylococcus aureus (BCID) NOT DETECTED NOT DETECTED Final   Staphylococcus epidermidis NOT DETECTED NOT DETECTED Final   Staphylococcus lugdunensis NOT DETECTED NOT DETECTED Final   Streptococcus species NOT DETECTED NOT DETECTED Final   Streptococcus agalactiae NOT DETECTED NOT DETECTED Final   Streptococcus pneumoniae NOT DETECTED NOT DETECTED Final   Streptococcus pyogenes NOT DETECTED NOT DETECTED Final   A.calcoaceticus-baumannii NOT DETECTED NOT DETECTED Final   Bacteroides fragilis NOT DETECTED NOT DETECTED Final   Enterobacterales NOT DETECTED NOT DETECTED Final   Enterobacter cloacae complex NOT DETECTED NOT DETECTED Final   Escherichia coli NOT DETECTED NOT DETECTED Final   Klebsiella aerogenes NOT DETECTED NOT DETECTED Final   Klebsiella oxytoca NOT  DETECTED NOT DETECTED Final   Klebsiella pneumoniae NOT DETECTED NOT DETECTED Final   Proteus species NOT DETECTED NOT DETECTED Final   Salmonella species NOT DETECTED NOT DETECTED Final   Serratia marcescens NOT DETECTED NOT DETECTED Final   Haemophilus influenzae NOT DETECTED NOT DETECTED Final   Neisseria meningitidis NOT DETECTED NOT DETECTED Final   Pseudomonas aeruginosa NOT DETECTED NOT DETECTED Final   Stenotrophomonas maltophilia NOT DETECTED NOT DETECTED Final   Candida albicans NOT DETECTED NOT DETECTED Final   Candida auris NOT DETECTED NOT DETECTED Final   Candida glabrata NOT DETECTED NOT DETECTED Final   Candida krusei NOT DETECTED NOT DETECTED Final   Candida parapsilosis NOT DETECTED NOT DETECTED Final   Candida tropicalis NOT DETECTED NOT DETECTED Final   Cryptococcus neoformans/gattii NOT DETECTED NOT DETECTED Final    Comment: Performed at [facility], [address], [place], [place] [address]  Culture, blood (routine x 2)     Status: None   Collection Time: 12/05/20  1:59 PM   Specimen: BLOOD RIGHT FOREARM  Result Value Ref Range Status   Specimen Description   Final    BLOOD RIGHT FOREARM Performed at [facility] [address], [place], [place] [address]    Special Requests   Final    BOTTLES DRAWN AEROBIC AND ANAEROBIC Blood Culture adequate volume Performed at [facility] [address], [place], [place] [address]    Culture   Final    NO GROWTH 5 DAYS Performed at [facility], [place] [address], [place], [place] [address]    Report Status 12/10/2020 FINAL  Final  Urine Culture     Status: Abnormal   Collection Time: 12/05/20  3:52 PM   Specimen: Urine, Random  Result Value Ref Range Status   Specimen Description   Final    URINE, RANDOM NEPHROSTOMY Performed at [place] [address] [place], [place] [address]    Special Requests   Final    NONE Performed at [facility], [place] [address] [place], [place] [address]    Culture 10,000 COLONIES/mL YEAST (A)  Final   Report Status 12/07/2020 FINAL  Final  Aerobic Culture w Gram Stain (superficial specimen)     Status: None   Collection Time: 12/05/20  4:25 PM   Specimen: JP Drain  Result Value Ref Range Status   Specimen Description   Final    JP DRAINAGE Performed at [facility] [address], [place], [place] [address]    Special Requests   Final    NONE Performed at [facility], [place] [address] [place], [place] [address]    Gram Stain   Final    NO SQUAMOUS EPITHELIAL CELLS PRESENT ABUNDANT WBC PRESENT,BOTH PMN AND MONONUCLEAR ABUNDANT GRAM NEGATIVE RODS Performed at [facility], [place] [address], [place], [place] [address]    Culture   Final    ABUNDANT ESCHERICHIA COLI MODERATE PSEUDOMONAS AERUGINOSA    Report Status 12/08/2020 FINAL  Final   Organism ID, Bacteria ESCHERICHIA COLI  Final   Organism ID, Bacteria PSEUDOMONAS AERUGINOSA  Final      Susceptibility   Escherichia coli - MIC*    AMPICILLIN >=32 RESISTANT Resistant     CEFAZOLIN <=4 SENSITIVE Sensitive     CEFEPIME <=0.12 SENSITIVE Sensitive     CEFTAZIDIME <=1 SENSITIVE Sensitive     CEFTRIAXONE <=0.25 SENSITIVE Sensitive     CIPROFLOXACIN <=0.25 SENSITIVE Sensitive     GENTAMICIN <=1 SENSITIVE Sensitive     IMIPENEM <=0.25 SENSITIVE Sensitive     TRIMETH/SULFA >=320 RESISTANT Resistant     AMPICILLIN/SULBACTAM >=32 RESISTANT Resistant     PIP/TAZO <=4 SENSITIVE Sensitive     * ABUNDANT ESCHERICHIA COLI   Pseudomonas aeruginosa - MIC*    CEFTAZIDIME 4 SENSITIVE Sensitive     CIPROFLOXACIN <=0.25 SENSITIVE Sensitive     GENTAMICIN <=1 SENSITIVE Sensitive     IMIPENEM 2 SENSITIVE Sensitive     PIP/TAZO <=4 SENSITIVE Sensitive     CEFEPIME 2 SENSITIVE Sensitive     * MODERATE PSEUDOMONAS AERUGINOSA  Aerobic/Anaerobic Culture w Gram Stain (surgical/deep  wound)     Status: None (Preliminary result)   Collection Time: 12/13/20 11:05 AM   Specimen: Abscess  Result Value Ref Range Status   Specimen Description ABSCESS ABDOMEN  Final   Special Requests   Final    NONE Performed at [facility], [address] [place], [place] [address]    Gram Stain PENDING  Incomplete   Culture PENDING  Incomplete   Report Status PENDING  Incomplete          Radiology Studies: CT ABDOMEN PELVIS W CONTRAST  Result Date: 12/12/2020 CLINICAL DATA:  Abdominal abscess/infection suspected. History of stercoral or suppuration and perforation of sigmoid colon status post Hartmann procedure with sigmoid colonic resection and colostomy. History of metastatic prostate cancer with bilateral percutaneous nephrostomy tubes EXAM: CT ABDOMEN AND PELVIS WITH CONTRAST TECHNIQUE: Multidetector CT imaging of the abdomen and pelvis was performed using the standard protocol following bolus administration of intravenous contrast. CONTRAST:  102mL OMNIPAQUE IOHEXOL 350 MG/ML SOLN COMPARISON:  Multiple priors including  most recent CT abdomen pelvis December 04, 2020 FINDINGS: Lower chest: Decreased now small right greater than left pleural effusions with adjacent atelectasis versus consolidations. Hepatobiliary: Subcapsular fluid collections further described below. Gallbladder is unremarkable. No biliary ductal dilation. Pancreas: Within normal limits. Spleen: No splenomegaly. Adrenals/Urinary Tract: Bilateral adrenal glands are unremarkable. No hydronephrosis. Bilateral percutaneous nephrostomy tubes remain in position with pigtails in the renal pelves. Bilateral double-J ureteral stents remain in position with pigtails in the renal pelves and urinary bladder. Severely thickened urinary bladder, similar prior. Stomach/Bowel: Stomach is within normal limits. No pathologic dilation of small or large bowel. Postsurgical changes of sigmoidectomy with Hartmann's pouch formation and left  anterior abdominal wall colostomy formation. Vascular/Lymphatic: Aortic atherosclerosis without aneurysmal dilation. Unchanged appearance of the multiple enlarged retroperitoneal, bilateral iliac and pelvic sidewall lymph nodes. Reproductive: Nodular prostatomegaly, similar prior reflecting known prostate neoplasm. Other: Open midline laparotomy incision. Small volume of free abdominopelvic fluid with peritoneal thickening consistent with peritonitis. Multiple walled off fluid collections throughout the abdomen and pelvis seen in the abdomen. Including: -New left anterior abdominal gas and fluid collection measures 10.6 x 5.4 x 15 cm on image 56/2 and 43/5, with this collection extending to the anterior left abdominal wall colostomy site. This fluid collection has a thin connection on image 53/5 with the fluid in the pelvis which was measured on prior study and contains a portion of the anterior pelvic drainage catheter. -Subdiaphragmatic perisplenic fluid collection which measures approximately 7.5 x 9.3 x 8.3 cm on image 77/5 and 21/2, similar to prior similar prior. -subcapsular hepatic collection measuring 5 x 2.1 cm on image 19/2, additional subcapsular hepatic collection measuring 1.8 cm on image 26/2 and an additional bilobar collections along the inferior aspect of the right hila hepatic lobe measuring approximately 6.5 x 5.6 cm on image 82/5, . -Small fluid collections in the bilateral pericolic gutters measuring 2.4 and 1.7 cm on the right and left respectively on image 60/2, similar to prior. Musculoskeletal: Anasarca. Similar multifocal sclerotic osseous metastases. IMPRESSION: 1. New abdominopelvic gas and fluid collection located in the left anterior abdomen demonstrating continuity with the pelvic fluid collection measured on prior study, most consistent with an abscess. The right anterior approach drainage catheter does extend through the pelvic portion of the collection however the tip is outside of  the collection. 2. Peritonitis with multiple walled off fluid collections throughout the abdomen and pelvis, overall similar to prior. 3. Decreased now small right greater than left pleural effusions with adjacent atelectasis versus consolidations. 4. Open ventral incision with postsurgical changes of sigmoidectomy and Hartmann's pouch formation with left anterior abdominal wall colostomy formation. 5. Bilateral percutaneous nephrostomy tubes remain in position with pigtails in the renal pelves and urinary bladder. Bilateral double-J ureteral stents remain in position. No hydronephrosis. 6. Prostatomegaly with similar metastatic abdominopelvic lymph nodes and osseous metastases. 7.  Aortic Atherosclerosis (ICD10-I70.0). Electronically Signed   By: Dahlia Bailiff M.D.   On: 12/12/2020 17:06   CT IMAGE GUIDED FLUID DRAIN BY CATHETER  Result Date: 12/13/2020 INDICATION: Intra-abdominal abscess EXAM: CT-guided drain placement into intra-abdominal abscess MEDICATIONS: The patient is currently admitted to the hospital and receiving intravenous antibiotics. The antibiotics were administered within an appropriate time frame prior to the initiation of the procedure. ANESTHESIA/SEDATION: Fentanyl 2 mcg IV; Versed 50 mg IV Moderate Sedation Time:  14 The patient was continuously monitored during the procedure by the interventional radiology nurse under my direct supervision. COMPLICATIONS: None immediate. PROCEDURE: Informed written consent was obtained  from the patient after a thorough discussion of the procedural risks, benefits and alternatives. All questions were addressed. Maximal Sterile Barrier Technique was utilized including caps, mask, sterile gowns, sterile gloves, sterile drape, hand hygiene and skin antiseptic. A timeout was performed prior to the initiation of the procedure. The patient was placed supine on the CT exam table. Limited CT of the abdomen was performed for planning purposes. This again  demonstrated a large air fluid collection in the left lower quadrant around the ostomy, consistent with abscess. Skin entry site was marked, the overlying skin was prepped and draped in the standard sterile fashion. Local analgesia was obtained with 1% lidocaine. Using CT guidance, a 12 [place] multipurpose drainage catheter was advanced directly into the fluid collection using trocar technique. There is immediate return of purulent material. Approximate 15-20 mL was aspirated and sent to the lab sample for further analysis. The catheter was locked, and location was confirmed with CT. The catheter was secured to the skin using silk suture and a dressing. It was attached to bulb suction. The patient tolerated the procedure well without complication. IMPRESSION: Successful CT-guided placement of a 12 French locking drainage catheter into the intra-abdominal peristomal abscess. Electronically Signed   By: Albin Felling M.D.   On: 12/13/2020 12:37        Scheduled Meds:  (feeding supplement) PROSource Plus  30 mL Oral BID BM   acetaminophen (TYLENOL) oral liquid 160 mg/5 mL  650 mg Oral Q6H   vitamin C  1,000 mg Oral Daily   carvedilol  3.125 mg Oral BID WC   chlorhexidine  15 mL Mouth Rinse BID   Chlorhexidine Gluconate Cloth  6 each Topical Daily   fentaNYL       furosemide  20 mg Intravenous BID   gabapentin  300 mg Oral BID   mouth rinse  15 mL Mouth Rinse q12n4p   methocarbamol  1,000 mg Oral TID   midazolam       mirtazapine  7.5 mg Oral QHS   multivitamin with minerals  1 tablet Oral Daily   pantoprazole  40 mg Oral Daily   predniSONE  5 mg Oral Q breakfast   protein supplement shake  11 oz Oral TID   sodium chloride flush  5 mL Intracatheter Q8H   sodium hypochlorite   Irrigation TID   Continuous Infusions:  piperacillin-tazobactam (ZOSYN)  IV 3.375 g (12/13/20 0536)   sodium chloride       LOS: 16 days    Time spent: 39 min   Georgette Shell, MD 12/13/2020, 1:49 PM

## 2020-12-13 NOTE — Care Management Important Message (Signed)
Important Message  Patient Details IM Letter given to the Patient. Name: Andre Glass MRN: [id number] Date of Birth: [birth year]   Medicare Important Message Given:  Yes     Kerin Salen 12/13/2020, 11:18 AM

## 2020-12-13 NOTE — Progress Notes (Signed)
Chief Complaint: Patient was seen in consultation today for abdominal abscess drain   Referring Physician(s): Barkley Boards PA-C  Supervising Physician: Juliet Rude  Patient Status: [facility] - In-pt  History of Present Illness: Andre Glass is a 70 y.o. male known to this IR group with chronic bilateral PCN. He's been admitted with abd pain and found to have LLQ intra-abdominal fluid collection concerning for abscess. IR is asked to eval for perc drain. PMHx, meds, labs, imaging, allergies reviewed. Has been NPO today as directed.   Past Medical History:  Diagnosis Date  . Anemia   . History of kidney stones   . Prostate cancer ([place])   . UTI (urinary tract infection)     Past Surgical History:  Procedure Laterality Date  . COLOSTOMY N/A 11/28/2020   Procedure: COLOSTOMY;  Surgeon: Clovis Riley, MD;  Location: [facility]S;  Service: General;  Laterality: N/A;  . IR IMAGING GUIDED PORT INSERTION  01/31/2020  . IR NEPHROSTOGRAM LEFT THRU EXISTING ACCESS  11/20/2020  . IR NEPHROSTOGRAM RIGHT THRU EXISTING ACCESS  11/20/2020  . IR NEPHROSTOMY EXCHANGE RIGHT  10/25/2020  . IR NEPHROSTOMY PLACEMENT LEFT  09/29/2020  . IR NEPHROSTOMY PLACEMENT LEFT  11/02/2020  . IR NEPHROSTOMY PLACEMENT RIGHT  09/29/2020  . IR NEPHROSTOMY PLACEMENT RIGHT  11/02/2020  . IR URETERAL STENT PERC [place] MOD SED  11/19/2020  . IR URETERAL STENT PERC [place] MOD SED  11/19/2020  . LAPAROTOMY N/A 11/28/2020   Procedure: EXPLORATORY LAPAROTOMY/ SIGMOID COLECTOMY;  Surgeon: Clovis Riley, MD;  Location: [facility];  Service: General;  Laterality: N/A;  . TRANSURETHRAL RESECTION OF PROSTATE N/A 11/08/2019   Procedure: TRANSURETHRAL RESECTION OF THE PROSTATE (TURP);  Surgeon: [name], MD;  Location: [facility];  Service: Urology;  Laterality: N/A;    Allergies: Ibuprofen and Shrimp [shellfish allergy]  Medications: Prior to Admission medications   Medication Sig Start Date End Date Taking? Authorizing  Provider  acetaminophen (TYLENOL) 325 MG tablet Take 2 tablets (650 mg total) by mouth every 6 (six) hours as needed for headache. 10/30/20  Yes Ezequiel Essex, MD  amLODipine (NORVASC) 10 MG tablet Take 1 tablet (10 mg total) by mouth daily. 10/05/20  Yes Rai, Ripudeep K, MD  feeding supplement (ENSURE ENLIVE / ENSURE PLUS) LIQD Take 237 mLs by mouth 3 (three) times daily between meals. 10/30/20  Yes Ezequiel Essex, MD  Iron-Vitamins (GERITOL COMPLETE) TABS Take 1 tablet by mouth daily.   Yes [provider]  lidocaine-prilocaine (EMLA) cream Apply 1 application topically as needed. 01/15/20  Yes [name], MD  mirtazapine (REMERON) 7.5 MG tablet TAKE 1 TABLET BY MOUTH AT BEDTIME. 11/21/20  Yes Gifford Shave, MD  Multiple Vitamin (MULTIVITAMIN WITH MINERALS) TABS tablet Take 1 tablet by mouth daily.   Yes [provider]  naloxegol oxalate (MOVANTIK) 12.5 MG TABS tablet Take 1 tablet (12.5 mg total) by mouth daily. Take it in the morning 1 hour before breakfast daily for constipation. 10/05/20  Yes Rai, Ripudeep K, MD  ondansetron (ZOFRAN) 4 MG tablet Take 1 tablet (4 mg total) by mouth 3 (three) times daily as needed for nausea or vomiting. 09/24/20  Yes Shadad, Mathis Dad, MD  Plecanatide (TRULANCE) 3 MG TABS Take 3 mg by mouth daily. 08/08/20  Yes [name], [name], DO  polyethylene glycol (MIRALAX) 17 g packet Take 17 g by mouth 2 (two) times daily. Patient taking differently: Take 17 g by mouth daily as needed for  mild constipation. 11/06/20  Yes Alen Bleacher, MD  predniSONE (DELTASONE) 5 MG tablet TAKE 1 TABLET BY MOUTH EVERY DAY WITH BREAKFAST Patient taking differently: Take 5 mg by mouth daily with breakfast. 10/18/20  Yes Shadad, Mathis Dad, MD  pregabalin (LYRICA) 50 MG capsule Take 1 capsule (50 mg total) by mouth 2 (two) times daily. 10/30/20 12/29/20 Yes Ezequiel Essex, MD  prochlorperazine (COMPAZINE) 10 MG tablet Take 1 tablet (10 mg total) by mouth every 6 (six) hours as  needed for nausea or vomiting. 09/24/20  Yes Shadad, Mathis Dad, MD  RIVAROXABAN Alveda Reasons) VTE STARTER PACK (15 & 20 MG) Follow package directions: Take one 15mg  tablet by mouth twice a day. On day 22, switch to one 20mg  tablet once a day. Take with food. Patient taking differently: Take 15-20 mg by mouth See admin instructions. Follow package directions: Take one 15mg  tablet by mouth twice a day. On day 22, switch to one 20mg  tablet once a day. Take with food. 11/20/20  Yes Lorin Glass, PA-C  tamsulosin (FLOMAX) 0.4 MG CAPS capsule TAKE 1 CAPSULE (0.4 MG TOTAL) BY MOUTH DAILY AS NEEDED (TO HELP WITH URINATION). 11/21/20  Yes Gifford Shave, MD  vitamin C (ASCORBIC ACID) 500 MG tablet Take 1,000 mg by mouth daily.   Yes [provider]  linaclotide (LINZESS) 290 MCG CAPS capsule Take 1 capsule (290 mcg total) by mouth daily as needed (constipation, take before breakfast). Patient not taking: No sig reported 10/05/20   Mendel Corning, MD     Family History  Problem Relation Age of Onset  . Stomach cancer Mother   . Pancreatic cancer Sister   . Breast cancer Neg Hx   . Prostate cancer Neg Hx   . Colon cancer Neg Hx     Social History   Socioeconomic History  . Marital status: Single    Spouse name: Not on file  . Number of children: 0  . Years of education: Not on file  . Highest education level: Not on file  Occupational History  . Not on file  Tobacco Use  . Smoking status: Never  . Smokeless tobacco: Never  Vaping Use  . Vaping Use: Never used  Substance and Sexual Activity  . Alcohol use: Never  . Drug use: Never  . Sexual activity: Not on file  Other Topics Concern  . Not on file  Social History Narrative  . Not on file   Social Determinants of Health   Financial Resource Strain: Not on file  Food Insecurity: Not on file  Transportation Needs: Not on file  Physical Activity: Not on file  Stress: Not on file  Social Connections: Not on file      Review of Systems: A 12 point ROS discussed and pertinent positives are indicated in the HPI above.  All other systems are negative.  Review of Systems  Vital Signs: BP 130/69 (BP Location: Right Arm)   Pulse 93   Temp 98.5 F (36.9 C) (Oral)   Resp 16   Ht 5\' 8"  (1.727 m)   Wt 81.4 kg   SpO2 97%   BMI 27.29 kg/m   Physical Exam Constitutional:      General: He is not in acute distress.    Appearance: He is ill-appearing.  HENT:     Mouth/Throat:     Mouth: Mucous membranes are moist.     Pharynx: Oropharynx is clear.  Cardiovascular:     Rate and Rhythm: Normal rate and  regular rhythm.     Heart sounds: Normal heart sounds.  Pulmonary:     Effort: Pulmonary effort is normal. No respiratory distress.     Breath sounds: Normal breath sounds.  Abdominal:     Palpations: Abdomen is soft.     Tenderness: There is abdominal tenderness.     Comments: LLQ tenderness  Skin:    General: Skin is warm and dry.  Neurological:     General: No focal deficit present.     Mental Status: He is alert and oriented to person, place, and time.    Imaging: DG Abd 1 View  Result Date: 12/01/2020 CLINICAL DATA:  NG tube placement EXAM: ABDOMEN - 1 VIEW COMPARISON:  11/29/2020 FINDINGS: Enteric tube terminates in the distal gastric antrum/pyloric region. Bilateral percutaneous nephrostomy catheters. Additional catheter overlying the pelvis. Nonobstructive bowel gas pattern. IMPRESSION: Enteric tube terminates in the distal gastric antrum/pyloric region. Electronically Signed   By: Julian Hy M.D.   On: 12/01/2020 02:56   DG Abd 1 View  Result Date: 11/29/2020 CLINICAL DATA:  Check gastric catheter placement EXAM: ABDOMEN - 1 VIEW COMPARISON:  Film from earlier in the same day. FINDINGS: Nephroureteral stents are noted bilaterally. Gastric catheter is now noted within the stomach. No obstructive changes are seen. No free air is noted. IMPRESSION: Gastric catheter within the  stomach. Electronically Signed   By: Inez Catalina M.D.   On: 11/29/2020 03:54   DG Abd 1 View  Result Date: 11/28/2020 CLINICAL DATA:  Abdominal pain, worsening acutely today. EXAM: ABDOMEN - 1 VIEW COMPARISON:  Comparison made with abdominal radiograph from November 28, 2020. FINDINGS: Lucency above the area of the distended sigmoid colon on the supine portable radiograph. Added density with the configuration of the sigmoid colon in the central abdomen compatible with large amount of stool. Bilateral percutaneous nephroureteral stents are in place. Some increasing distension of small bowel loops in the LEFT abdomen are suggested. On limited assessment there is no acute skeletal process. IMPRESSION: Large amount of stool in the distal colon as described in this patient with known tumor in the pelvis that appears to partially obstruct the colon. Increased lucency over lying the upper margin of stool filled sigmoid colon more likely reflects gas-filled transverse colon. Given findings on prior imaging of colonic obstruction with dilation of the sigmoid would suggest upright and supine abdominal radiographs to exclude the possibility of free air. Slight increase in small bowel distension since previous CT imaging is suggested. This may reflect developing ileus. These results were called by telephone at the time of interpretation on 11/28/2020 at 3:07 pm to provider [facility] , who verbally acknowledged these results. Electronically Signed   By: Zetta Bills M.D.   On: 11/28/2020 15:00   DG Abd 1 View  Result Date: 11/28/2020 CLINICAL DATA:  Constipation EXAM: ABDOMEN - 1 VIEW COMPARISON:  Portable exam 0505 hours compared to 11/10/2020 FINDINGS: Interval clearance of contrast. Significantly increased stool in colon especially sigmoid colon and rectum. Slight gaseous distention of sigmoid loop. Small bowel gas pattern normal. BILATERAL ureteral stent/nephrostomy tubes unchanged. Bones demineralized.  IMPRESSION: Significantly increased stool burden. Electronically Signed   By: Lavonia Dana M.D.   On: 11/28/2020 08:20   CT ABDOMEN PELVIS W CONTRAST  Result Date: 12/12/2020 CLINICAL DATA:  Abdominal abscess/infection suspected. History of stercoral or suppuration and perforation of sigmoid colon status post Hartmann procedure with sigmoid colonic resection and colostomy. History of metastatic prostate cancer with bilateral percutaneous nephrostomy  tubes EXAM: CT ABDOMEN AND PELVIS WITH CONTRAST TECHNIQUE: Multidetector CT imaging of the abdomen and pelvis was performed using the standard protocol following bolus administration of intravenous contrast. CONTRAST:  53mL OMNIPAQUE IOHEXOL 350 MG/ML SOLN COMPARISON:  Multiple priors including most recent CT abdomen pelvis December 04, 2020 FINDINGS: Lower chest: Decreased now small right greater than left pleural effusions with adjacent atelectasis versus consolidations. Hepatobiliary: Subcapsular fluid collections further described below. Gallbladder is unremarkable. No biliary ductal dilation. Pancreas: Within normal limits. Spleen: No splenomegaly. Adrenals/Urinary Tract: Bilateral adrenal glands are unremarkable. No hydronephrosis. Bilateral percutaneous nephrostomy tubes remain in position with pigtails in the renal pelves. Bilateral double-J ureteral stents remain in position with pigtails in the renal pelves and urinary bladder. Severely thickened urinary bladder, similar prior. Stomach/Bowel: Stomach is within normal limits. No pathologic dilation of small or large bowel. Postsurgical changes of sigmoidectomy with Hartmann's pouch formation and left anterior abdominal wall colostomy formation. Vascular/Lymphatic: Aortic atherosclerosis without aneurysmal dilation. Unchanged appearance of the multiple enlarged retroperitoneal, bilateral iliac and pelvic sidewall lymph nodes. Reproductive: Nodular prostatomegaly, similar prior reflecting known prostate neoplasm.  Other: Open midline laparotomy incision. Small volume of free abdominopelvic fluid with peritoneal thickening consistent with peritonitis. Multiple walled off fluid collections throughout the abdomen and pelvis seen in the abdomen. Including: -New left anterior abdominal gas and fluid collection measures 10.6 x 5.4 x 15 cm on image 56/2 and 43/5, with this collection extending to the anterior left abdominal wall colostomy site. This fluid collection has a thin connection on image 53/5 with the fluid in the pelvis which was measured on prior study and contains a portion of the anterior pelvic drainage catheter. -Subdiaphragmatic perisplenic fluid collection which measures approximately 7.5 x 9.3 x 8.3 cm on image 77/5 and 21/2, similar to prior similar prior. -subcapsular hepatic collection measuring 5 x 2.1 cm on image 19/2, additional subcapsular hepatic collection measuring 1.8 cm on image 26/2 and an additional bilobar collections along the inferior aspect of the right hila hepatic lobe measuring approximately 6.5 x 5.6 cm on image 82/5, . -Small fluid collections in the bilateral pericolic gutters measuring 2.4 and 1.7 cm on the right and left respectively on image 60/2, similar to prior. Musculoskeletal: Anasarca. Similar multifocal sclerotic osseous metastases. IMPRESSION: 1. New abdominopelvic gas and fluid collection located in the left anterior abdomen demonstrating continuity with the pelvic fluid collection measured on prior study, most consistent with an abscess. The right anterior approach drainage catheter does extend through the pelvic portion of the collection however the tip is outside of the collection. 2. Peritonitis with multiple walled off fluid collections throughout the abdomen and pelvis, overall similar to prior. 3. Decreased now small right greater than left pleural effusions with adjacent atelectasis versus consolidations. 4. Open ventral incision with postsurgical changes of sigmoidectomy  and Hartmann's pouch formation with left anterior abdominal wall colostomy formation. 5. Bilateral percutaneous nephrostomy tubes remain in position with pigtails in the renal pelves and urinary bladder. Bilateral double-J ureteral stents remain in position. No hydronephrosis. 6. Prostatomegaly with similar metastatic abdominopelvic lymph nodes and osseous metastases. 7.  Aortic Atherosclerosis (ICD10-I70.0). Electronically Signed   By: Dahlia Bailiff M.D.   On: 12/12/2020 17:06   CT ABDOMEN PELVIS W CONTRAST  Result Date: 12/04/2020 CLINICAL DATA:  Intra-abdominal abscess, history of stercoral ulceration and perforation of the sigmoid colon, status post Hartmann procedure sigmoid colon resection and colostomy, metastatic prostate cancer EXAM: CT ABDOMEN AND PELVIS WITH CONTRAST TECHNIQUE: Multidetector CT imaging of  the abdomen and pelvis was performed using the standard protocol following bolus administration of intravenous contrast. CONTRAST:  57mL OMNIPAQUE IOHEXOL 350 MG/ML SOLN, additional oral enteric contrast COMPARISON:  11/27/2020 FINDINGS: Lower chest: Moderate, right greater than left pleural effusions associated atelectasis or consolidation. Hepatobiliary: No solid liver abnormality is seen. No gallstones, gallbladder wall thickening, or biliary dilatation. Pancreas: Unremarkable. No pancreatic ductal dilatation or surrounding inflammatory changes. Spleen: Normal in size without significant abnormality. Adrenals/Urinary Tract: Adrenal glands are unremarkable. Bilateral percutaneous nephrostomy tubes remain in position formed pigtails in the renal pelves. Bilateral double-J ureteral stent catheter remain in position with formed pigtails in the renal pelves and urinary bladder. Severely thickened urinary bladder. No hydronephrosis. Stomach/Bowel: Stomach is within normal limits. Appendix appears normal. No evidence of bowel wall thickening, distention, or inflammatory changes. Vascular/Lymphatic:  Scattered aortic atherosclerosis. Multiple enlarged retroperitoneal, bilateral iliac, and pelvic sidewall lymph nodes, unchanged compared to prior examination. Reproductive: Gross prostatomegaly. Other: Status post interval midline laparotomy. Surgical drain about the low abdomen. There is an air and fluid collection about the surgical drain in the left lower quadrant, measuring approximately 5.2 x 3.1 cm (series 2, image 81). Anasarca, worsened compared to prior examination. Small volume ascites, increased compared to prior examination, loculated appearing areas fluid, particularly about the posterior liver (series 2, image 29) and spleen (series 2, image 26). Musculoskeletal: Multiple sclerotic osseous metastases, unchanged. IMPRESSION: 1. Status post interval midline laparotomy with sigmoid colon resection and left lower quadrant end colostomy. 2. Surgical drain about the low abdomen. There is an air and fluid collection about the surgical drain in the left lower quadrant, measuring approximately 5.2 x 3.1 cm, concerning for abscess. 3. Small volume ascites, increased compared to prior examination, with loculated appearing areas of fluid about the abdomen. 4. Bilateral percutaneous nephrostomy tubes and double-J ureteral stent catheter remain in position with formed pigtails. 5. Prostatomegaly with metastatic lymphadenopathy and osseous metastatic disease, unchanged compared to recent. 6. Anasarca, worsened compared to prior examination. 7. Moderate, right greater than left pleural effusions, worsened compared to prior examination. Aortic Atherosclerosis (ICD10-I70.0). Electronically Signed   By: Eddie Candle M.D.   On: 12/04/2020 13:28   CT ABDOMEN PELVIS W CONTRAST  Result Date: 11/27/2020 CLINICAL DATA:  Bowel obstruction suspected, history of prostate cancer, ongoing chemotherapy EXAM: CT ABDOMEN AND PELVIS WITH CONTRAST TECHNIQUE: Multidetector CT imaging of the abdomen and pelvis was performed using the  standard protocol following bolus administration of intravenous contrast. CONTRAST:  80mL OMNIPAQUE IOHEXOL 350 MG/ML SOLN, additional oral enteric contrast COMPARISON:  CT abdomen pelvis, 11/20/2020 FINDINGS: Lower chest: Trace bilateral pleural effusions and associated atelectasis or consolidation. Hepatobiliary: No solid liver abnormality is seen. No gallstones, gallbladder wall thickening, or biliary dilatation. Pancreas: Unremarkable. No pancreatic ductal dilatation or surrounding inflammatory changes. Spleen: Normal in size without significant abnormality. Adrenals/Urinary Tract: Adrenal glands are unremarkable. Bilateral percutaneous nephrostomy tubes with formed pigtails in the renal pelves. Additional bilateral double-J ureteral stent catheters with formed pigtails in the renal pelves and urinary bladder. The urinary bladder is again severely thickened. Stomach/Bowel: Stomach is within normal limits. Appendix appears normal. There is again noted a large burden of stool throughout the colon and rectum, with very large stool balls in the distended sigmoid colon. The caliber of the colon at this level is 9.9 cm, previously no greater than 6.5 cm (series 4, image 57). Vascular/Lymphatic: Aortic atherosclerosis. Multiple enlarged retroperitoneal, bilateral iliac, and pelvic sidewall lymph nodes are unchanged from recent CT. Reproductive: Johney Maine  prostatomegaly. Other: Mild anasarca. Trace ascites. Right eccentric anterior pelvic mass is unchanged compared to recent prior CT (series 2, image 88). Musculoskeletal: Multiple sclerotic osseous metastases, unchanged. IMPRESSION: 1. There is again noted a large burden of stool throughout the colon and rectum, with very large stool balls in the distended sigmoid colon. The caliber of the colon at this level is 9.9 cm, previously no greater than 6.5 cm. Findings are concerning for fecal impaction, with abnormal transit due to adjacent prostate malignancy. 2. Bilateral  percutaneous nephrostomy tubes with formed pigtails in the renal pelves and urinary bladder. Additional bilateral double-J ureteral stent catheters with formed pigtails in the renal pelves and urinary bladder. No hydronephrosis. 3. Findings of advanced metastatic prostate malignancy not significantly changed compared to recent CT. 4. The urinary bladder is again severely thickened, likely related to chronic outlet obstruction, possibly with superimposed infectious or inflammatory cystitis. 5. Trace bilateral pleural effusions and associated atelectasis or consolidation. 6. Trace ascites and anasarca. Aortic Atherosclerosis (ICD10-I70.0). Electronically Signed   By: Eddie Candle M.D.   On: 11/27/2020 13:58   CT ABDOMEN PELVIS W CONTRAST  Result Date: 11/20/2020 CLINICAL DATA:  Diverticulitis suspected. Bilateral percutaneous nephrostomy tube EXAM: CT ABDOMEN AND PELVIS WITH CONTRAST TECHNIQUE: Multidetector CT imaging of the abdomen and pelvis was performed using the standard protocol following bolus administration of intravenous contrast. CONTRAST:  22mL OMNIPAQUE IOHEXOL 350 MG/ML SOLN COMPARISON:  10/25/2020 FINDINGS: Lower chest:  No contributory findings. Hepatobiliary: No focal liver abnormality.No evidence of biliary obstruction or stone. Pancreas: Unremarkable. Spleen: Unremarkable. Adrenals/Urinary Tract: Negative adrenals. Bilateral nephroureteral stents in unremarkable position. Bilateral pelviectasis with no hydronephrosis or asymmetric renal enhancement. No perinephric collection. Thick walled bladder with scratch the history of prostate cancer with bladder base invasion. Contrast in the urinary bladder from recent stent. Stomach/Bowel: Adenopathy/tumor encompasses the distal rectum which is collapsed relative to the remaining colon which is diffusely distended by stool. No noted bowel inflammation. Vascular/Lymphatic: Central nonenhancing appearance of the bilateral common femoral, superficial  femoral, and deep femoral veins with greater diameter on the left. The iliac vessels are compressed by adenopathy in the pelvis. Diffuse retroperitoneal adenopathy. A preaortic node measures 2 cm in short axis, only a few mm larger than before. An index left periaortic node measures 18 mm in diameter on 2:42, also mildly progressed. Mass in the ventral right pelvis is in an uncertain location relative to the peritoneum and has enlarged to 4.7 cm as compared to 2.7 cm previously Reproductive:Diffuse heterogeneous enhancement of the prostate from tumor. Other: No ascites or pneumoperitoneum. Musculoskeletal: Diffuse sclerotic metastatic disease greatest in the sacrum where there is likely tumor infiltrating the canal and definitely infiltrating the presacral space and right sacral foramina. These results were called by telephone at the time of interpretation on 11/20/2020 at 11:41 am to provider [facility] , who verbally acknowledged these results. IMPRESSION: 1. Unremarkable bilateral nephroureteral stents. Contrast reaches the urinary bladder which is small capacity and very thick walled due to extensive pelvic tumor, urethral patency is questionable. 2. Evidence of bilateral lower extremity DVT. Pelvic veins are compressed by pelvic adenopathy, recommend Doppler assessment. 3. Stool distended colon above the rectum which is encompassed by tumor, possible malignant stricture. 4. Progressive intra-abdominal metastatic disease compared to July 2022. 5. Extensive osseous metastatic disease with chronic canal and foraminal infiltration at the sacrum. Electronically Signed   By: Monte Fantasia M.D.   On: 11/20/2020 11:45   DG SWALLOW FUNC OP MEDICARE SPEECH PATH  Result Date: 11/18/2020 Table formatting from the original result was not included. Objective Swallowing Evaluation: Type of Study: MBS-Modified Barium Swallow Study  Patient Details Name: MARKO SKALSKI MRN: [id number] Date of Birth: 23-May-1950 Today's  Date: 11/18/2020 Time: SLP Start Time (ACUTE ONLY): 1114 -SLP Stop Time (ACUTE ONLY): 1143 SLP Time Calculation (min) (ACUTE ONLY): 29 min Past Medical History: Past Medical History: Diagnosis Date . Anemia  . History of kidney stones  . Prostate cancer ([place])  . UTI (urinary tract infection)  Past Surgical History: Past Surgical History: Procedure Laterality Date . IR IMAGING GUIDED PORT INSERTION  01/31/2020 . IR NEPHROSTOMY EXCHANGE RIGHT  10/25/2020 . IR NEPHROSTOMY PLACEMENT LEFT  09/29/2020 . IR NEPHROSTOMY PLACEMENT LEFT  11/02/2020 . IR NEPHROSTOMY PLACEMENT RIGHT  09/29/2020 . IR NEPHROSTOMY PLACEMENT RIGHT  11/02/2020 . TRANSURETHRAL RESECTION OF PROSTATE N/A 11/08/2019  Procedure: TRANSURETHRAL RESECTION OF THE PROSTATE (TURP);  Surgeon: [name], MD;  Location: [facility];  Service: Urology;  Laterality: N/A; HPI: 70 yr old seen for outpatient MBS stating he has difficulty masticating his food and tries to swallow but is unable. Also reports low vocal intensity. Seen 10/2020 with recommendation for regular texture, thin liquids. MRI at that time was negative. Oral-motor ROM and strength appeared functional. PMH: metastatic protstate cancer, recurrentl UTI, HTN, obstructive uropathy with bil hydronephrosis and placement of bil nephrostomy tubes.  No data recorded Assessment / Plan / Recommendation CHL IP CLINICAL IMPRESSIONS 11/18/2020 Clinical Impression Pharyngeal swallow was within normal limits. Pt states he is unable to masticate effectively and propel food to initiate a swallow. He was able to manipulate puree, masticate soft ceral bar texture and barium coated graham cracker with additional time and obvious effort. Under fluoroscopy he achieved lateral mastication pattern and intermittent lingual propulsion/tongu to hard palate attempts to propel textures. Min-mild residue anterior tongue masticated with graham cracker to a puree texture. WIth sips of thin barium was able to clear oral cavity. Etiology  of oral deficits unclear. His lingual ROM and strength appear appropriate during oral-motor assessment. He is receiving chemo although not to oral mucousa. Possible xerostomia leading to poor lubrication of bolus although lingual surface did not appear dry on observation. Behavioral versus neuro involvement (?). MRI last month was negative. Recommended pt possible follow up with neurology if does not improve. Continue with [facility] Speech Pathologist. Recommend textures with small amount of mastication and moving up to softer solids (graham cracker softened in applesauce, cut up banana, soft pasta, meatloaf). Thin liquids, pills with thin (surprisingly he transited pill with thin barium without difficulty). Pt and friend eduated verbally and written instructions to the above. SLP Visit Diagnosis Dysphagia, oral phase (R13.11) Attention and concentration deficit following -- Frontal lobe and executive function deficit following -- Impact on safety and function Mild aspiration risk   CHL IP TREATMENT RECOMMENDATION 11/18/2020 Treatment Recommendations Defer treatment plan to f/u with SLP   Prognosis 10/18/2020 Prognosis for Safe Diet Advancement Good Barriers to Reach Goals -- Barriers/Prognosis Comment -- CHL IP DIET RECOMMENDATION 11/18/2020 SLP Diet Recommendations Thin liquid;Dysphagia 2 (Fine chop) solids Liquid Administration via Cup;Straw Medication Administration Whole meds with liquid Compensations -- Postural Changes --   CHL IP OTHER RECOMMENDATIONS 11/18/2020 Recommended Consults -- Oral Care Recommendations Oral care BID Other Recommendations --   CHL IP FOLLOW UP RECOMMENDATIONS 11/18/2020 Follow up Recommendations Home health SLP   CHL IP FREQUENCY AND DURATION 10/18/2020 Speech Therapy Frequency (ACUTE ONLY) min 1 x/week Treatment Duration 1 week  CHL IP ORAL PHASE 11/18/2020 Oral Phase Impaired Oral - Pudding Teaspoon -- Oral - Pudding Cup -- Oral - Honey Teaspoon -- Oral - Honey Cup -- Oral - Nectar Teaspoon  -- Oral - Nectar Cup -- Oral - Nectar Straw -- Oral - Thin Teaspoon -- Oral - Thin Cup Lingual/palatal residue Oral - Thin Straw WFL Oral - Puree Delayed oral transit;Lingual/palatal residue Oral - Mech Soft Delayed oral transit;Lingual/palatal residue Oral - Regular Delayed oral transit;Lingual/palatal residue Oral - Multi-Consistency -- Oral - Pill -- Oral Phase - Comment --  CHL IP PHARYNGEAL PHASE 11/18/2020 Pharyngeal Phase WFL Pharyngeal- Pudding Teaspoon -- Pharyngeal -- Pharyngeal- Pudding Cup -- Pharyngeal -- Pharyngeal- Honey Teaspoon -- Pharyngeal -- Pharyngeal- Honey Cup -- Pharyngeal -- Pharyngeal- Nectar Teaspoon -- Pharyngeal -- Pharyngeal- Nectar Cup -- Pharyngeal -- Pharyngeal- Nectar Straw -- Pharyngeal -- Pharyngeal- Thin Teaspoon -- Pharyngeal -- Pharyngeal- Thin Cup -- Pharyngeal -- Pharyngeal- Thin Straw -- Pharyngeal -- Pharyngeal- Puree -- Pharyngeal -- Pharyngeal- Mechanical Soft -- Pharyngeal -- Pharyngeal- Regular -- Pharyngeal -- Pharyngeal- Multi-consistency -- Pharyngeal -- Pharyngeal- Pill -- Pharyngeal -- Pharyngeal Comment --  CHL IP CERVICAL ESOPHAGEAL PHASE 11/18/2020 Cervical Esophageal Phase WFL Pudding Teaspoon -- Pudding Cup -- Honey Teaspoon -- Honey Cup -- Nectar Teaspoon -- Nectar Cup -- Nectar Straw -- Thin Teaspoon -- Thin Cup -- Thin Straw -- Puree -- Mechanical Soft -- Regular -- Multi-consistency -- Pill -- Cervical Esophageal Comment -- [place] Siren 11/18/2020, 5:29 PM    Orbie Pyo Litaker M.Ed Actor Pager [phone] Office [phone]         CLINICAL DATA:  Dysphagia. EXAM: MODIFIED BARIUM SWALLOW TECHNIQUE: Different consistencies of barium were administered orally to the patient by the Speech Pathologist. Imaging of the pharynx was performed in the lateral projection. The radiologist was present in the fluoroscopy room for this study, providing personal supervision. FLUOROSCOPY TIME:  Fluoroscopy Time:  1 minutes, 58 seconds  Radiation Exposure Index (if provided by the fluoroscopic device): 12.59 mGy Number of Acquired Spot Images: 0 COMPARISON:  None. FINDINGS: Thin liquid swallows: Generally normal although at least 1 swallow had some vallecular retention. Puree consistency: Unremarkable Puree with cracker: Somewhat prolonged chewing and question of delayed movement to the back of the throat, but otherwise unremarkable IMPRESSION: 1. Somewhat prolonged chewing with the puree with cracker consistency as noted above. Mild vallecular retention with 1 of several thin liquid swallows. Otherwise unremarkable. Please refer to the Speech Pathologists report for complete details and recommendations. Electronically Signed   By: Van Clines M.D.   On: 11/18/2020 11:44   IR URETERAL STENT PERC EXCHG MOD SED  Result Date: 11/20/2020 INDICATION: 70 year old gentleman with history of prostate malignancy invading and causing obstruction of the ureteral vesicular junctions. Bilateral nephrostomy drains initially placed on 09/29/2020. Since placement, patient has had multiple episodes of drain retraction/dislodgement. Most recently on 11/02/2020, the left drain had entirely dislodged and the right drain was significantly retracted. The drain tracts could not be accessed and new nephrostomy drains had to be placed. Patient was referred by Dr. [name] for ureteral stent placement. Given the tumor burden within the bladder, I believe there is a high probability of ureteral stents clogging. I also believe cystoscopy for this patient would be difficult given the prostate/bladder mass. I discussed with Dr. [name], that I recommended the patient have bilateral nephroureteral drains placed as these would be significantly more stable than nephrostomy tubes alone, and they could be flushed which would limit  stent clogging. Furthermore, these drains could be capped as long as the patient remained asymptomatic. Dr. [name] was agreeable with this plan. EXAM:  Fluoroscopy guided conversion of bilateral nephrostomy drains to nephroureteral drains. COMPARISON:  None. MEDICATIONS: Rocephin 2 gm IV; The antibiotic was administered in an appropriate time frame prior to skin puncture. ANESTHESIA/SEDATION: Fentanyl 50 mcg IV; Versed 1 mg IV Moderate Sedation Time:  47 minutes The patient was continuously monitored during the procedure by the interventional radiology nurse under my direct supervision. CONTRAST:  20 mL of Omnipaque 300-administered into the collecting system(s) FLUOROSCOPY TIME:  Fluoroscopy Time: 4 minutes 42 seconds (16 mGy). COMPLICATIONS: None immediate. PROCEDURE: Informed written consent was obtained from the patient after a thorough discussion of the procedural risks, benefits and alternatives. All questions were addressed. Maximal Sterile Barrier Technique was utilized including caps, mask, sterile gowns, sterile gloves, sterile drape, hand hygiene and skin antiseptic. A timeout was performed prior to the initiation of the procedure. Patient position prone on the procedure table. The external segment of bilateral nephrostomy drain and surrounding skin prepped and draped in usual fashion. Scout images demonstrated the nephrostomy drains in appropriate position. The left drain was cut and removed over 0.035 inch glidewire. Kumpe catheter and glidewire were advanced to the bladder. Contrast administered through the Kumpe the catheter under fluoroscopy confirmed appropriate access to the bladder lumen. Glidewire exchanged for Amplatz wire and 10.2 French 24 cm nephroureteral drain was inserted. Contrast administered through the drain confirmed appropriate positioning within the renal pelvis and bladder. The right drain was cut and removed over 0.035 inch glidewire. Kumpe catheter and glidewire were advanced to the bladder. Contrast administered through the Kumpe the catheter under fluoroscopy confirmed appropriate access to the bladder lumen. Glidewire  exchanged for Amplatz wire and 10.2 French 24 cm nephroureteral drain was inserted. Contrast administered through the drain confirmed appropriate positioning within the renal pelvis and bladder. Both drains were flushed and capped. IMPRESSION: Bilateral nephrostomy drains exchanged for 10.2 French 24 cm nephroureteral drains. PLAN: The drains were capped. The patient was provided with bags and instructed to reattached the bags if he develops any symptoms of urinary obstruction such as pain, fever, or chills. The patient should return in 12 weeks for routine exchange. Electronically Signed   By: Miachel Roux M.D.   On: 11/20/2020 07:30   IR URETERAL STENT PERC EXCHG MOD SED  Result Date: 11/20/2020 INDICATION: 70 year old gentleman with history of prostate malignancy invading and causing obstruction of the ureteral vesicular junctions. Bilateral nephrostomy drains initially placed on 09/29/2020. Since placement, patient has had multiple episodes of drain retraction/dislodgement. Most recently on 11/02/2020, the left drain had entirely dislodged and the right drain was significantly retracted. The drain tracts could not be accessed and new nephrostomy drains had to be placed. Patient was referred by Dr. [name] for ureteral stent placement. Given the tumor burden within the bladder, I believe there is a high probability of ureteral stents clogging. I also believe cystoscopy for this patient would be difficult given the prostate/bladder mass. I discussed with Dr. [name], that I recommended the patient have bilateral nephroureteral drains placed as these would be significantly more stable than nephrostomy tubes alone, and they could be flushed which would limit stent clogging. Furthermore, these drains could be capped as long as the patient remained asymptomatic. Dr. [name] was agreeable with this plan. EXAM: Fluoroscopy guided conversion of bilateral nephrostomy drains to nephroureteral drains. COMPARISON:  None.  MEDICATIONS: Rocephin 2 gm IV; The antibiotic  was administered in an appropriate time frame prior to skin puncture. ANESTHESIA/SEDATION: Fentanyl 50 mcg IV; Versed 1 mg IV Moderate Sedation Time:  47 minutes The patient was continuously monitored during the procedure by the interventional radiology nurse under my direct supervision. CONTRAST:  20 mL of Omnipaque 300-administered into the collecting system(s) FLUOROSCOPY TIME:  Fluoroscopy Time: 4 minutes 42 seconds (16 mGy). COMPLICATIONS: None immediate. PROCEDURE: Informed written consent was obtained from the patient after a thorough discussion of the procedural risks, benefits and alternatives. All questions were addressed. Maximal Sterile Barrier Technique was utilized including caps, mask, sterile gowns, sterile gloves, sterile drape, hand hygiene and skin antiseptic. A timeout was performed prior to the initiation of the procedure. Patient position prone on the procedure table. The external segment of bilateral nephrostomy drain and surrounding skin prepped and draped in usual fashion. Scout images demonstrated the nephrostomy drains in appropriate position. The left drain was cut and removed over 0.035 inch glidewire. Kumpe catheter and glidewire were advanced to the bladder. Contrast administered through the Kumpe the catheter under fluoroscopy confirmed appropriate access to the bladder lumen. Glidewire exchanged for Amplatz wire and 10.2 French 24 cm nephroureteral drain was inserted. Contrast administered through the drain confirmed appropriate positioning within the renal pelvis and bladder. The right drain was cut and removed over 0.035 inch glidewire. Kumpe catheter and glidewire were advanced to the bladder. Contrast administered through the Kumpe the catheter under fluoroscopy confirmed appropriate access to the bladder lumen. Glidewire exchanged for Amplatz wire and 10.2 French 24 cm nephroureteral drain was inserted. Contrast administered through  the drain confirmed appropriate positioning within the renal pelvis and bladder. Both drains were flushed and capped. IMPRESSION: Bilateral nephrostomy drains exchanged for 10.2 French 24 cm nephroureteral drains. PLAN: The drains were capped. The patient was provided with bags and instructed to reattached the bags if he develops any symptoms of urinary obstruction such as pain, fever, or chills. The patient should return in 12 weeks for routine exchange. Electronically Signed   By: Miachel Roux M.D.   On: 11/20/2020 07:30   DG CHEST PORT 1 VIEW  Result Date: 12/05/2020 CLINICAL DATA:  70 year old male history of pleural effusion. EXAM: PORTABLE CHEST 1 VIEW COMPARISON:  Chest x-ray 10/27/2020. FINDINGS: Right internal jugular single-lumen power porta cath with tip terminating at the superior cavoatrial junction. Lung volumes are low. There are bibasilar opacities which may reflect areas of atelectasis and/or consolidation, likely with superimposed small bilateral pleural effusions. No pneumothorax. No evidence of pulmonary edema. Heart size is normal. Upper mediastinal contours are within normal limits. IMPRESSION: 1. Low lung volumes with bibasilar opacities which may reflect areas of atelectasis and/or consolidation, likely with small bilateral pleural effusions. Electronically Signed   By: Vinnie Langton M.D.   On: 12/05/2020 15:03   DG Abd 2 Views  Result Date: 11/20/2020 CLINICAL DATA:  Unable to urinate status post conversion to nephroureteral catheters. EXAM: ABDOMEN - 2 VIEW COMPARISON:  November 02, 2020 FINDINGS: The bowel gas pattern is normal. A very large amount of stool is seen throughout the large bowel, with a moderate amount of retained contrast seen throughout the transverse colon. Bilateral percutaneous nephroureteral catheters are seen. There is no evidence of free air. No radio-opaque calculi or other significant radiographic abnormality is seen. IMPRESSION: 1. Very large stool burden  without evidence of bowel obstruction. 2. Bilateral percutaneous nephroureteral catheters. Electronically Signed   By: Virgina Norfolk M.D.   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 Nephrostomy drains initially placed on 09/29/2020. Multiple episodes of retraction/drain dislodgement has occurred since that time. On 11/02/2020, fresh nephrostomy drains were placed through new access as previous drains were either dislodged or too far retracted. On 11/20/2020, bilateral nephrostomy drains were converted to nephroureteral drains and capped. Patient returned to the ED later the same night with leaking around drain entry sites. EXAM: Fluoroscopic evaluation of bilateral nephroureteral drains. COMPARISON:  None. MEDICATIONS: None ANESTHESIA/SEDATION: None CONTRAST:  20 mL of Omnipaque 300-administered into the collecting system(s) FLUOROSCOPY TIME:  Fluoroscopy Time: 0 minutes 24 seconds (8 mGy). COMPLICATIONS: None immediate. PROCEDURE: Informed written consent was obtained from the patient after a thorough discussion of the procedural risks, benefits and alternatives. All questions were addressed. Patient positioned prone on the procedure table. Scout images demonstrated appropriate positioning of bilateral nephroureteral drains. This was confirmed by administering contrast through both drains under fluoroscopy. Drains were flushed, attached to bags, in secured to skin with silk suture. IMPRESSION: Bilateral nephroureteral drains in appropriate position. PLAN: Given that the patient had leaking around the drain skin entry sites within 24 hours of capping, the drains were reattached to bags and should be maintained open to bag for the foreseeable future. Patient should return in 12 weeks for routine exchange. I would  still recommend maintaining the nephroureteral drains as these provide greater stability, particularly given multiple prior episodes of retraction/dislodgement. Electronically Signed   By: Miachel Roux M.D.   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 Nephrostomy drains initially placed on 09/29/2020. Multiple episodes of retraction/drain dislodgement has occurred since that time. On 11/02/2020, fresh nephrostomy drains were placed through new access as previous drains were either dislodged or too far retracted. On 11/20/2020, bilateral nephrostomy drains were converted to nephroureteral drains and capped. Patient returned to the ED later the same night with leaking around drain entry sites. EXAM: Fluoroscopic evaluation of bilateral nephroureteral drains. COMPARISON:  None. MEDICATIONS: None ANESTHESIA/SEDATION: None CONTRAST:  20 mL of Omnipaque 300-administered into the collecting system(s) FLUOROSCOPY TIME:  Fluoroscopy Time: 0 minutes 24 seconds (8 mGy). COMPLICATIONS: None immediate. PROCEDURE: Informed written consent was obtained from the patient after a thorough discussion of the procedural risks, benefits and alternatives. All questions were addressed. Patient positioned prone on the procedure table. Scout images demonstrated appropriate positioning of bilateral nephroureteral drains. This was confirmed by administering contrast through both drains under fluoroscopy. Drains were flushed, attached to bags, in secured to skin with silk suture. IMPRESSION: Bilateral nephroureteral drains in appropriate position. PLAN: Given that the patient had leaking around the drain skin entry sites within 24 hours of capping, the drains were reattached to bags and should be maintained open to bag for the foreseeable future.  Patient should return in 12 weeks for routine exchange. I would still recommend maintaining the nephroureteral drains as these provide greater stability, particularly given multiple prior episodes of retraction/dislodgement. Electronically Signed   By: Miachel Roux M.D.   On: 11/20/2020 16:20   VAS [place] LOWER EXTREMITY VENOUS (DVT) (ONLY [facility] & [facility])  Result Date: 11/20/2020  Lower Venous DVT Study Patient Name:  Admiral R Amoroso  Date of Exam:   11/20/2020 Medical Rec #: [id number]  Accession #:    [id number] Date of Birth: February 20, 1951    Patient Gender: M Patient Age:   79 years Exam Location:  [facility] Procedure:      VAS [place] LOWER EXTREMITY VENOUS (DVT) Referring Phys: Gareth Morgan --------------------------------------------------------------------------------  Indications: Abnormal CT of abdomen/pelvis.  Risk Factors: Chemotherapy Cancer Prostate. Performing Technologist: Rogelia Rohrer RVT, RDMS  Examination Guidelines: A complete evaluation includes B-mode imaging, spectral Doppler, color Doppler, and power Doppler as needed of all accessible portions of each vessel. Bilateral testing is considered an integral part of a complete examination. Limited examinations for reoccurring indications may be performed as noted. The reflux portion of the exam is performed with the patient in reverse Trendelenburg.  +---------+---------------+---------+-----------+----------+--------------+ RIGHT    CompressibilityPhasicitySpontaneityPropertiesThrombus Aging +---------+---------------+---------+-----------+----------+--------------+ CFV      Full           Yes      Yes                                 +---------+---------------+---------+-----------+----------+--------------+ SFJ      Full                                                        +---------+---------------+---------+-----------+----------+--------------+ FV Prox  Full           Yes      Yes                                  +---------+---------------+---------+-----------+----------+--------------+ FV Mid   Full           Yes      Yes                                 +---------+---------------+---------+-----------+----------+--------------+ FV DistalFull           Yes      Yes                                 +---------+---------------+---------+-----------+----------+--------------+ PFV      Full                                                        +---------+---------------+---------+-----------+----------+--------------+ POP      Full           Yes      Yes                                 +---------+---------------+---------+-----------+----------+--------------+ PTV      Full                                                        +---------+---------------+---------+-----------+----------+--------------+ PERO  None           No       No                   Acute          +---------+---------------+---------+-----------+----------+--------------+   +---------+---------------+---------+-----------+----------+--------------+ LEFT     CompressibilityPhasicitySpontaneityPropertiesThrombus Aging +---------+---------------+---------+-----------+----------+--------------+ CFV      None           No       No                   Acute          +---------+---------------+---------+-----------+----------+--------------+ SFJ      None                                         Acute          +---------+---------------+---------+-----------+----------+--------------+ FV Prox  None           No       No                   Acute          +---------+---------------+---------+-----------+----------+--------------+ FV Mid   None           No       No                   Acute          +---------+---------------+---------+-----------+----------+--------------+ FV DistalNone           No       No                   Acute           +---------+---------------+---------+-----------+----------+--------------+ PFV      None           No       No                   Acute          +---------+---------------+---------+-----------+----------+--------------+ POP      Partial        Yes      Yes                  Acute          +---------+---------------+---------+-----------+----------+--------------+ PTV      None           No       No                   Acute          +---------+---------------+---------+-----------+----------+--------------+ PERO     None           No       No                   Acute          +---------+---------------+---------+-----------+----------+--------------+     Summary: BILATERAL: -No evidence of popliteal cyst, bilaterally. RIGHT: - Findings consistent with acute deep vein thrombosis involving the right peroneal veins.  LEFT: - Findings consistent with acute deep vein thrombosis involving the left common femoral vein, SF junction, left femoral vein, left proximal profunda vein, left popliteal vein, left posterior tibial veins, and left peroneal veins.  *See table(s) above  for measurements and observations. Electronically signed by Harold Barban MD on 11/20/2020 at 10:41:44 PM.    Final    VAS US AORTA/IVC/ILIACS  Result Date: 11/20/2020 IVC/ILIAC STUDY Patient Name:  BRAY VICKERMAN  Date of Exam:   11/20/2020 Medical Rec #: [id number]    Accession #:    [id number] Date of Birth: [birth year]    Patient Gender: M Patient Age:   34 years Exam Location:  [facility] Procedure:      VAS US AORTA/IVC/ILIACS Referring Phys: Gareth Morgan --------------------------------------------------------------------------------  Indications: LLE DVT Other Factors: CA patient on chemotherapy. Limitations: Air/bowel gas.  Comparison Study: No previous exams Performing Technologist: Jody Hill RVT, RDMS  Examination Guidelines: A complete evaluation includes B-mode imaging, spectral Doppler, color Doppler, and  power Doppler as needed of all accessible portions of each vessel. Bilateral testing is considered an integral part of a complete examination. Limited examinations for reoccurring indications may be performed as noted.  IVC/Iliac Findings: +----------+------+--------+--------------+    IVC    PatentThrombus   Comments    +----------+------+--------+--------------+ IVC Mid   patent                       +----------+------+--------+--------------+ IVC Distal              Not visualized +----------+------+--------+--------------+  +-------------------+---------+-----------+---------+-----------+--------------+         CIV        RT-PatentRT-ThrombusLT-PatentLT-Thrombus   Comments    +-------------------+---------+-----------+---------+-----------+--------------+ Common Iliac Prox                                          Not visualized +-------------------+---------+-----------+---------+-----------+--------------+ Common Iliac Mid                                           Not visualized +-------------------+---------+-----------+---------+-----------+--------------+ Common Iliac Distal                                        Not visualized +-------------------+---------+-----------+---------+-----------+--------------+ Not seen due to over lying bowel gas - patient not NPO +-------------------------+---------+-----------+---------+-----------+--------+            EIV           RT-PatentRT-ThrombusLT-PatentLT-ThrombusComments +-------------------------+---------+-----------+---------+-----------+--------+ External Iliac Vein Prox  patent                         acute            +-------------------------+---------+-----------+---------+-----------+--------+ External Iliac Vein Mid   patent                         acute            +-------------------------+---------+-----------+---------+-----------+--------+ External Iliac Vein       patent                          acute            Distal                                                                    +-------------------------+---------+-----------+---------+-----------+--------+  Summary: IVC/Iliac: There is no evidence of thrombus involving the IVC in visualized areas. There is no evidence of thrombus involving the right external iliac vein. There is evidence of acute thrombus involving the left external iliac vein. Visualization of distal Inferior Vena Cava, proximal common Iliac, mid common Iliac and distal common Iliac was limited.  *See table(s) above for measurements and observations.  Electronically signed by Harold Barban MD on 11/20/2020 at 10:41:23 PM.    Final     Labs:  CBC: Recent Labs    12/10/20 0253 12/11/20 0438 12/12/20 0231 12/13/20 0403  WBC 19.6* 20.2* 21.1* 20.6*  HGB 8.2* 8.2* 7.9* 8.0*  HCT 26.4* 26.2* 25.2* 25.5*  PLT 569* 606* 831* 576*    COAGS: Recent Labs    11/27/20 1701 11/28/20 0044 11/28/20 0806 11/29/20 1020 12/13/20 0403  INR  --   --   --   --  1.3*  APTT 34 59* 51* 27  --     BMP: Recent Labs    12/09/20 0327 12/10/20 0253 12/11/20 0438 12/13/20 0403  NA 142 140 141 138  K 3.2* 3.8 4.4 3.8  CL 113* 110 113* 105  CO2 23 22 23 24   GLUCOSE 98 96 96 85  BUN 24* 25* 18 19  CALCIUM 8.6* 8.5* 8.7* 8.8*  CREATININE 0.80 0.78 0.80 0.84  GFRNONAA >60 >60 >60 >60    LIVER FUNCTION TESTS: Recent Labs    12/02/20 0353 12/03/20 0842 12/04/20 0327 12/05/20 0325 12/08/20 0321 12/09/20 0327 12/10/20 0253 12/13/20 0403  BILITOT 0.9 1.0 0.9  --   --   --   --  0.6  AST 41 36 33  --   --   --   --  27  ALT 15 18 14   --   --   --   --  12  ALKPHOS 116 119 119  --   --   --   --  196*  PROT 5.1* 4.8* 5.1*  --   --   --   --  6.9  ALBUMIN 1.7* 1.5* 1.4*   < > 2.0* 2.4* 2.1* 1.9*   < > = values in this interval not displayed.    TUMOR MARKERS: No results for input(s): AFPTM, CEA, CA199, CHROMGRNA in the last 8760  hours.  Assessment and Plan: LLQ intra-abdominal abscess CT reviewed, amenable to perc drainage Labs reviewed. Risks and benefits discussed with the patient including bleeding, infection, damage to adjacent structures, bowel perforation/fistula connection, and sepsis.  All of the patient's questions were answered, patient is agreeable to proceed. Consent signed and in chart.   Thank you for this interesting consult.  I greatly enjoyed meeting Andre Glass and look forward to participating in their care.  A copy of this report was sent to the requesting provider on this date.  Electronically Signed: Ascencion Dike, PA-C 12/13/2020, 10:22 AM   I spent a total of 20 minutes in face to face in clinical consultation, greater than 50% of which was counseling/coordinating care for abscess drain

## 2020-12-13 NOTE — Progress Notes (Signed)
Palliative Care Brief Note  I stopped by to check in on [name] today.  He was off the floor for procedure.  Overall, goals remain for aggressive care.  Palliative will plan to check in sometime this weekend.    Please call if there are palliative specific needs with which we can be of assistance in the interim.  Micheline Rough, MD [place] Team [phone]  No charge note

## 2020-12-13 NOTE — Progress Notes (Signed)
SLP Cancellation Note  Patient Details Name: Andre Glass MRN: [id number] DOB: [birth year]   Cancelled treatment:       Reason Eval/Treat Not Completed: Medical issues which prohibited therapy;Other (comment) (pt now npo) Kathleen Lime, MS [place] Office [phone] Pager [phone]   Macario Golds 12/13/2020, 8:32 AM

## 2020-12-13 NOTE — Procedures (Signed)
Interventional Radiology Procedure Note  Date of Procedure: 12/13/2020  Procedure: CT guided drain placement   Findings:  1. Successful CT guided placement of a 12 Fr locking drainage catheter into LLQ intraabdominal peristomal abscess    Complications: No immediate complications noted.   Estimated Blood Loss: minimal  Follow-up and Recommendations: 1. F/u culture results  2. Flush x2 daily    Albin Felling, MD  Vascular & Interventional Radiology  12/13/2020 11:01 AM

## 2020-12-13 NOTE — Progress Notes (Signed)
Progress Note  15 Days Post-Op  Subjective: Patient reports abdominal pain is still difficult to control with PO meds. Tolerating diet.   Objective: Vital signs in last 24 hours: Temp:  [98.1 F (36.7 C)-98.6 F (37 C)] 98.5 F (36.9 C) (09/09 0757) Pulse Rate:  [84-98] 95 (09/09 1055) Resp:  [16-20] 20 (09/09 1055) BP: (116-137)/(59-74) 122/66 (09/09 1055) SpO2:  [97 %-100 %] 100 % (09/09 1055) Weight:  [81.4 kg] 81.4 kg (09/09 0423) Last BM Date: 12/10/20  Intake/Output from previous day: 09/08 0701 - 09/09 0700 In: 703.1 [P.O.:240; I.V.:147.2; IV Piggyback:315.9] Out: 2030 [Urine:1950; Stool:80] Intake/Output this shift: Total I/O In: -  Out: 771 [Urine:700; Drains:71]  PE: General appearance: alert, cooperative, no distress, and appears frail and tired. Resp: normal effort  GI: Midline wound with greenish drainage on dressing and musty odor.  Drainage in the JP is unchanged brownish colored fluid. Stoma viable and putting out stool      Lab Results:  Recent Labs    12/12/20 0231 12/13/20 0403  WBC 21.1* 20.6*  HGB 7.9* 8.0*  HCT 25.2* 25.5*  PLT 831* 576*   BMET Recent Labs    12/11/20 0438 12/13/20 0403  NA 141 138  K 4.4 3.8  CL 113* 105  CO2 23 24  GLUCOSE 96 85  BUN 18 19  CREATININE 0.80 0.84  CALCIUM 8.7* 8.8*   PT/INR Recent Labs    12/13/20 0403  LABPROT 16.1*  INR 1.3*   CMP     Component Value Date/Time   NA 138 12/13/2020 0403   NA 143 11/25/2020 1554   K 3.8 12/13/2020 0403   CL 105 12/13/2020 0403   CO2 24 12/13/2020 0403   GLUCOSE 85 12/13/2020 0403   BUN 19 12/13/2020 0403   BUN 23 11/25/2020 1554   CREATININE 0.84 12/13/2020 0403   CREATININE 1.00 11/14/2020 1146   CALCIUM 8.8 (L) 12/13/2020 0403   PROT 6.9 12/13/2020 0403   ALBUMIN 1.9 (L) 12/13/2020 0403   AST 27 12/13/2020 0403   AST 35 11/14/2020 1146   ALT 12 12/13/2020 0403   ALT 45 (H) 11/14/2020 1146   ALKPHOS 196 (H) 12/13/2020 0403   BILITOT 0.6  12/13/2020 0403   BILITOT 0.3 11/14/2020 1146   GFRNONAA >60 12/13/2020 0403   GFRNONAA >60 11/14/2020 1146   GFRAA >60 11/29/2019 1306   Lipase     Component Value Date/Time   LIPASE 22 11/27/2020 1035       Studies/Results: CT ABDOMEN PELVIS W CONTRAST  Result Date: 12/12/2020 CLINICAL DATA:  Abdominal abscess/infection suspected. History of stercoral or suppuration and perforation of sigmoid colon status post Hartmann procedure with sigmoid colonic resection and colostomy. History of metastatic prostate cancer with bilateral percutaneous nephrostomy tubes EXAM: CT ABDOMEN AND PELVIS WITH CONTRAST TECHNIQUE: Multidetector CT imaging of the abdomen and pelvis was performed using the standard protocol following bolus administration of intravenous contrast. CONTRAST:  25mL OMNIPAQUE IOHEXOL 350 MG/ML SOLN COMPARISON:  Multiple priors including most recent CT abdomen pelvis December 04, 2020 FINDINGS: Lower chest: Decreased now small right greater than left pleural effusions with adjacent atelectasis versus consolidations. Hepatobiliary: Subcapsular fluid collections further described below. Gallbladder is unremarkable. No biliary ductal dilation. Pancreas: Within normal limits. Spleen: No splenomegaly. Adrenals/Urinary Tract: Bilateral adrenal glands are unremarkable. No hydronephrosis. Bilateral percutaneous nephrostomy tubes remain in position with pigtails in the renal pelves. Bilateral double-J ureteral stents remain in position with pigtails in the renal pelves and urinary  bladder. Severely thickened urinary bladder, similar prior. Stomach/Bowel: Stomach is within normal limits. No pathologic dilation of small or large bowel. Postsurgical changes of sigmoidectomy with Hartmann's pouch formation and left anterior abdominal wall colostomy formation. Vascular/Lymphatic: Aortic atherosclerosis without aneurysmal dilation. Unchanged appearance of the multiple enlarged retroperitoneal, bilateral iliac  and pelvic sidewall lymph nodes. Reproductive: Nodular prostatomegaly, similar prior reflecting known prostate neoplasm. Other: Open midline laparotomy incision. Small volume of free abdominopelvic fluid with peritoneal thickening consistent with peritonitis. Multiple walled off fluid collections throughout the abdomen and pelvis seen in the abdomen. Including: -New left anterior abdominal gas and fluid collection measures 10.6 x 5.4 x 15 cm on image 56/2 and 43/5, with this collection extending to the anterior left abdominal wall colostomy site. This fluid collection has a thin connection on image 53/5 with the fluid in the pelvis which was measured on prior study and contains a portion of the anterior pelvic drainage catheter. -Subdiaphragmatic perisplenic fluid collection which measures approximately 7.5 x 9.3 x 8.3 cm on image 77/5 and 21/2, similar to prior similar prior. -subcapsular hepatic collection measuring 5 x 2.1 cm on image 19/2, additional subcapsular hepatic collection measuring 1.8 cm on image 26/2 and an additional bilobar collections along the inferior aspect of the right hila hepatic lobe measuring approximately 6.5 x 5.6 cm on image 82/5, . -Small fluid collections in the bilateral pericolic gutters measuring 2.4 and 1.7 cm on the right and left respectively on image 60/2, similar to prior. Musculoskeletal: Anasarca. Similar multifocal sclerotic osseous metastases. IMPRESSION: 1. New abdominopelvic gas and fluid collection located in the left anterior abdomen demonstrating continuity with the pelvic fluid collection measured on prior study, most consistent with an abscess. The right anterior approach drainage catheter does extend through the pelvic portion of the collection however the tip is outside of the collection. 2. Peritonitis with multiple walled off fluid collections throughout the abdomen and pelvis, overall similar to prior. 3. Decreased now small right greater than left pleural  effusions with adjacent atelectasis versus consolidations. 4. Open ventral incision with postsurgical changes of sigmoidectomy and Hartmann's pouch formation with left anterior abdominal wall colostomy formation. 5. Bilateral percutaneous nephrostomy tubes remain in position with pigtails in the renal pelves and urinary bladder. Bilateral double-J ureteral stents remain in position. No hydronephrosis. 6. Prostatomegaly with similar metastatic abdominopelvic lymph nodes and osseous metastases. 7.  Aortic Atherosclerosis (ICD10-I70.0). Electronically Signed   By: Dahlia Bailiff M.D.   On: 12/12/2020 17:06    Anti-infectives: Anti-infectives (From admission, onward)    Start     Dose/Rate Route Frequency Ordered Stop   11/28/20 2230  piperacillin-tazobactam (ZOSYN) IVPB 3.375 g        3.375 g 12.5 mL/hr over 240 Minutes Intravenous Every 8 hours 11/28/20 2121     11/28/20 2215  piperacillin-tazobactam (ZOSYN) IVPB 3.375 g  Status:  Discontinued       Note to Pharmacy: Given Intraoperative   3.375 g 100 mL/hr over 30 Minutes Intravenous Every 8 hours 11/28/20 2117 11/28/20 2121   11/28/20 1730  piperacillin-tazobactam (ZOSYN) IVPB 3.375 g       Note to Pharmacy: Given Intraoperative   3.375 g 100 mL/hr over 30 Minutes Intravenous  Once 11/28/20 1729 11/28/20 1750   11/28/20 1712  piperacillin-tazobactam (ZOSYN) 3.375 GM/50ML IVPB       Note to Pharmacy: Marchia Meiers   : cabinet override      11/28/20 1712 11/28/20 1735   11/28/20 1545  ceFAZolin (ANCEF) IVPB  2g/100 mL premix        2 g 200 mL/hr over 30 Minutes Intravenous  Once 11/28/20 1538 11/28/20 1630   11/28/20 1539  ceFAZolin (ANCEF) 2-4 GM/100ML-% IVPB       Note to Pharmacy: Minor, Anneita   : cabinet override      11/28/20 1539 11/28/20 1654   11/27/20 1700  cephALEXin (KEFLEX) capsule 500 mg  Status:  Discontinued        500 mg Oral 3 times daily 11/27/20 1640 11/28/20 2117        Assessment/Plan Fecal impaction/Severe  acute on chronic constipation/acute perforation after enema POD#14 s/p ex lap, sigmoid colectomy, end colostomy Dr. Kae Heller 8/25 Findings of large perforation with diffuse feculent peritonitis and extensive contamination of the peritoneal cavity with stool and pus - CT 8/31 shows pelvic collection with surgical drain in good position, drainage is purulent appearing - continue drain and IV abx - CT yesterday with collection in L abdomen around stoma - IR consulted for drainage of this, pt remains on Zosyn  - tolerating Dys 3 diet and calorie count in progress, discussed with RN to make sure premier protein shakes from home are being recorded as well - patient reports difficulty controlling pain with PO meds - maybe palliative could weigh in on pain control at this time - WOC following, stoma looking more viable and functioning  - TID wet-to-dry dressing changes to midline wound, restart Dakin's x5d for pseudomonal colonization  - incentive spirometry, PT/OT - mobilize as able - surgical pathology confirms metastatic prostate cancer with positive LN   FEN: DYS 3 diet, calorie count >> soft diet ID: keflex. Zosyn 8/25>>  VTE: heparin gtt Follow up:  Dr. Kae Heller   Metastatic prostate cancer ABL anemia on anemia of chronic disease - hgb 8.0, stable DVT, b/l LE  Wide complex tachycardia 12/11/20 - K+ 4.4, Mag 2.0 Severe malnutrition - meeting 29% kcal need and 54% protein need 11/10/20 - prealbumin 9.9 - Nutrition recommending TF - Discussed core track and possibly supplementing his diet with p.m. feedings.  He is currently eating more with a soft diet and would like to continue with a soft diet for now.  LOS: 16 days    Norm Parcel, [facility] 12/13/2020, 11:40 AM Please see Amion for pager number during day hours 7:00am-4:30pm

## 2020-12-14 NOTE — Progress Notes (Signed)
Andre NOTE    STRYKER VEASEY  WPV:[id number] DOB: 1951-04-01 DOA: 11/27/2020 PCP: [name], Andre Glass   Brief Narrative: 70 year old male with a history of metastatic prostate cancer, chronic bilateral nephrostomies due to obstructive uropathy from underlying cancer, was admitted to the hospital with right-sided abdominal pain.  Initial CT scan performed did not show any specific obstructive pathology in the bowel.  He was initially seen by GI and general surgery they recommended continue bowel regimen and continued observation.  CT also showed bilateral DVTs and he was started on anticoagulation.  The patient was receiving enemas and unfortunately developed perforation of colon with peritonitis.  He was emergently taken to the operating room and underwent laparotomy, partial colectomy and colostomy.  Biopsies from surgery confirmed metastatic prostate cancer.  He has a JP drain in place and there is concern for underlying abscess.  He is currently on IV antibiotics.  Postoperative recovery has been slow due to his multiple medical issues.  He was seen by both oncology and palliative care.  Patient wishes to continue with current treatments with hopes for improvement.  Assessment & Plan:   Principal Problem:   Stercoral perforation of colon s/p Hartmann colectomy/colostomy 11/28/2020 Active Problems:   Metastatic castration-resistant adenocarcinoma of prostate (HCC)   BPH (benign prostatic hyperplasia)   Symptomatic anemia   Fecal impaction (HCC)   Hypokalemia   Hypomagnesemia   DVT of deep femoral vein, left (HCC)   Malnutrition of moderate degree   Fecal peritonitis ([place])   #1 fecal impaction with acute on chronic constipation and stercoral ulcer with perforation resulting in peritonitis status post exploratory laparotomy colectomy and end colostomy on 11/28/2020 with by Dr. Windle Guard.  He presented with a 3-day history of fecal impaction in the setting of prostate cancer and narcotics at home.   He did not respond to stool softeners and laxatives. GI consulted recommended enemas Patient subsequently became hypotensive with some guarding and rigidity of his abdomen with diaphoresis.  Stat KUB showed possible free air.  General surgery was reconsulted who took him to the OR on an emergent basis.  He underwent exploratory laparotomy sigmoid colectomy and end colostomy.  Surgical pathology showed metastatic prostate cancer. Palliative care care has been consulted patient remains full code and all aggressive measures to be taken.  #2 Acute b/l lower extremity DVT -patient continues to have active bleeding and right nephrostomy tubes with hematuria.  With active bleeding and poor prognosis will DC heparin.    Discussed with Dr. [name].  #3 metastatic prostate cancer oncology has been consulted with no  new recommendations currently.  #4 acute on chronic anemia he received 4 units of packed RBCs during his hospital stay.  We will need to watch closely on heparin with hematuria.  #5 bilateral nephrostomy tubes with hematuria monitor.  Daily.  #6 moderate malnutrition-his p.o. intake has been poor on dysphagia 3 diet.  Staff reports that patient had 100% breakfast this morning.  #7 goals of care patient with extreme poor prognosis due to metastatic prostate cancer and bowel perforation with peritonitis, patient remains a full code after multiple discussions with patient and his brother.  Palliative care following.   Nutrition Problem: Moderate Malnutrition Etiology: chronic illness, cancer and cancer related treatments     Signs/Symptoms: mild fat depletion, mild muscle depletion, moderate muscle depletion    Interventions: Prostat, Premier Protein, MVI  Estimated body mass index is 27.52 kg/m as calculated from the following:   Height as of this encounter:  5\' 8"  (1.727 m).   Weight as of this encounter: 82.1 kg.  DVT prophylaxis: None due to ongoing bleeding requiring blood  transfusion code Status: Full code  family Communication: None at bedside  Disposition Plan:  Status is: Inpatient  Remains inpatient appropriate because:Hemodynamically unstable   Dispo: The patient is from: Home              Anticipated d/c is to: SNF              Patient currently is not medically stable to d/c.   Difficult to place patient Yes       Consultants:  General surgery, GI, urology, oncology, palliative care  Procedures: Exploratory laparotomy sigmoid colectomy and end colostomy as an emergent procedure CT-guided left lower quadrant drain placed on 12/12/2020 for intra-abdominal abscess Antimicrobials:  Anti-infectives (From admission, onward)    Start     Dose/Rate Route Frequency Ordered Stop   11/28/20 2230  piperacillin-tazobactam (ZOSYN) IVPB 3.375 g        3.375 g 12.5 mL/hr over 240 Minutes Intravenous Every 8 hours 11/28/20 2121     11/28/20 2215  piperacillin-tazobactam (ZOSYN) IVPB 3.375 g  Status:  Discontinued       Note to Pharmacy: Given Intraoperative   3.375 g 100 mL/hr over 30 Minutes Intravenous Every 8 hours 11/28/20 2117 11/28/20 2121   11/28/20 1730  piperacillin-tazobactam (ZOSYN) IVPB 3.375 g       Note to Pharmacy: Given Intraoperative   3.375 g 100 mL/hr over 30 Minutes Intravenous  Once 11/28/20 1729 11/28/20 1750   11/28/20 1712  piperacillin-tazobactam (ZOSYN) 3.375 GM/50ML IVPB       Note to Pharmacy: Marchia Meiers   : cabinet override      11/28/20 1712 11/28/20 1735   11/28/20 1545  ceFAZolin (ANCEF) IVPB 2g/100 mL premix        2 g 200 mL/hr over 30 Minutes Intravenous  Once 11/28/20 1538 11/28/20 1630   11/28/20 1539  ceFAZolin (ANCEF) 2-4 GM/100ML-% IVPB       Note to Pharmacy: Minor, Anneita   : cabinet override      11/28/20 1539 11/28/20 1654   11/27/20 1700  cephALEXin (KEFLEX) capsule 500 mg  Status:  Discontinued        500 mg Oral 3 times daily 11/27/20 1640 11/28/20 2117        Subjective: Resting in bed  weak tired exhausted appears ill no events overnight had CT-guided placement of drainage catheter in the left lower quadrant intra abdominal peristomal abscess on 12/12/2020. Heparin was stopped 12/12/2020 hematuria has cleared up no further bleeding noted from the abdominal dressings CT abdomen 12/12/2020 with new abdominal pelvic gas and fluid collection located in the left anterior abdomen demonstrating a continue to with the pelvic fluid collection most consistent with an abscess.  Multiple walled off fluid collections throughout the abdomen and pelvis.  Bilateral nephrostomy tubes in position with pigtails in the renal pelvis and urinary bladder with bilateral double-J ureteral stents remain in position no hydronephrosis  objective: Vitals:   12/14/20 0400 12/14/20 0405 12/14/20 0456 12/14/20 1342  BP: 132/64  124/61 (!) 109/53  Pulse: 85  92 83  Resp: 18  17 18   Temp: 98.3 F (36.8 C)  99.1 F (37.3 C) 98.7 F (37.1 C)  TempSrc: Oral  Oral Oral  SpO2: 97%  93% 96%  Weight:  82.1 kg    Height:        Intake/Output  Summary (Last 24 hours) at 12/14/2020 1401 Last data filed at 12/14/2020 0543 Gross per 24 hour  Intake 200.15 ml  Output 1279 ml  Net -1078.85 ml    Filed Weights   12/11/20 0548 12/13/20 0423 12/14/20 0405  Weight: 89 kg 81.4 kg 82.1 kg    Examination:  General exam: Appears calm and comfortable  Respiratory system: Clear to auscultation. Respiratory effort normal. Cardiovascular system: S1 & S2 heard, RRR. No JVD, murmurs, rubs, gallops or clicks. No pedal edema. Gastrointestinal system: Abdomen is nondistended, soft and nontender. No organomegaly or masses felt. Normal bowel sounds heard.  And covered with dressing.  Bilateral nephrostomy tubes, colostomy with liquid stool and gas, JP drain in place Central nervous system: Alert and oriented. No focal neurological deficits. Extremities: 2+ edema Skin: No rashes, lesions or ulcers Psychiatry: Judgement and insight  appear normal. Mood & affect appropriate.     Data Reviewed: I have personally reviewed following labs and imaging studies  CBC: Recent Labs  Lab 12/09/20 0327 12/10/20 0253 12/11/20 0438 12/12/20 0231 12/13/20 0403  WBC 21.2* 19.6* 20.2* 21.1* 20.6*  HGB 8.0* 8.2* 8.2* 7.9* 8.0*  HCT 24.5* 26.4* 26.2* 25.2* 25.5*  MCV 92.8 96.7 96.3 96.6 96.2  PLT 540* 569* 606* 831* 576*    Basic Metabolic Panel: Recent Labs  Lab 12/08/20 0321 12/09/20 0327 12/10/20 0253 12/11/20 0438 12/13/20 0403  NA 138 142 140 141 138  K 3.6 3.2* 3.8 4.4 3.8  CL 111 113* 110 113* 105  CO2 21* 23 22 23 24   GLUCOSE 90 98 96 96 85  BUN 24* 24* 25* 18 19  CREATININE 0.87 0.80 0.78 0.80 0.84  CALCIUM 8.1* 8.6* 8.5* 8.7* 8.8*  MG 1.8  --  1.8 2.0  --   PHOS 3.3 2.8 2.9  --   --     GFR: Estimated Creatinine Clearance: 85.5 mL/min (by C-G formula based on SCr of 0.84 mg/dL). Liver Function Tests: Recent Labs  Lab 12/08/20 0321 12/09/20 0327 12/10/20 0253 12/13/20 0403  AST  --   --   --  27  ALT  --   --   --  12  ALKPHOS  --   --   --  196*  BILITOT  --   --   --  0.6  PROT  --   --   --  6.9  ALBUMIN 2.0* 2.4* 2.1* 1.9*    No results for input(s): LIPASE, AMYLASE in the last 168 hours. No results for input(s): AMMONIA in the last 168 hours. Coagulation Profile: Recent Labs  Lab 12/13/20 0403  INR 1.3*    Cardiac Enzymes: No results for input(s): CKTOTAL, CKMB, CKMBINDEX, TROPONINI in the last 168 hours. BNP (last 3 results) No results for input(s): PROBNP in the last 8760 hours. HbA1C: No results for input(s): HGBA1C in the last 72 hours. CBG: No results for input(s): GLUCAP in the last 168 hours. Lipid Profile: No results for input(s): CHOL, HDL, LDLCALC, TRIG, CHOLHDL, LDLDIRECT in the last 72 hours. Thyroid Function Tests: No results for input(s): TSH, T4TOTAL, FREET4, T3FREE, THYROIDAB in the last 72 hours. Anemia Panel: No results for input(s): VITAMINB12, FOLATE,  FERRITIN, TIBC, IRON, RETICCTPCT in the last 72 hours. Sepsis Labs: No results for input(s): PROCALCITON, LATICACIDVEN in the last 168 hours.  Recent Results (from the past 240 hour(s))  Culture, blood (routine x 2)     Status: Abnormal (Preliminary result)   Collection Time: 12/05/20  1:51 PM  Specimen: BLOOD  Result Value Ref Range Status   Specimen Description   Final    BLOOD RIGHT ANTECUBITAL Performed at [facility] [address] [place], [place] [address]    Special Requests   Final    BOTTLES DRAWN AEROBIC AND ANAEROBIC Blood Culture adequate volume Performed at [facility] [address] [place], [place] [address]    Culture  Setup Time (A)  Final    GRAM VARIABLE ROD ANAEROBIC BOTTLE ONLY CRITICAL RESULT CALLED TO, READ BACK BY AND VERIFIED WITH: L POINDEXTER,[email or web address]  12/08/20 [place]    Culture (A)  Final    GRAM VARIABLE ROD CULTURE REINCUBATED FOR BETTER GROWTH Performed at [facility], [place] [address] [place], [place] [address]    Report Status PENDING  Incomplete  Blood Culture ID Panel (Reflexed)     Status: None   Collection Time: 12/05/20  1:51 PM  Result Value Ref Range Status   Enterococcus faecalis NOT DETECTED NOT DETECTED Final   Enterococcus Faecium NOT DETECTED NOT DETECTED Final   Listeria monocytogenes NOT DETECTED NOT DETECTED Final   Staphylococcus species NOT DETECTED NOT DETECTED Final   Staphylococcus aureus (BCID) NOT DETECTED NOT DETECTED Final   Staphylococcus epidermidis NOT DETECTED NOT DETECTED Final   Staphylococcus lugdunensis NOT DETECTED NOT DETECTED Final   Streptococcus species NOT DETECTED NOT DETECTED Final   Streptococcus agalactiae NOT DETECTED NOT DETECTED Final   Streptococcus pneumoniae NOT DETECTED NOT DETECTED Final   Streptococcus pyogenes NOT DETECTED NOT DETECTED Final   A.calcoaceticus-baumannii NOT DETECTED NOT DETECTED Final   Bacteroides fragilis NOT DETECTED NOT  DETECTED Final   Enterobacterales NOT DETECTED NOT DETECTED Final   Enterobacter cloacae complex NOT DETECTED NOT DETECTED Final   Escherichia coli NOT DETECTED NOT DETECTED Final   Klebsiella aerogenes NOT DETECTED NOT DETECTED Final   Klebsiella oxytoca NOT DETECTED NOT DETECTED Final   Klebsiella pneumoniae NOT DETECTED NOT DETECTED Final   Proteus species NOT DETECTED NOT DETECTED Final   Salmonella species NOT DETECTED NOT DETECTED Final   Serratia marcescens NOT DETECTED NOT DETECTED Final   Haemophilus influenzae NOT DETECTED NOT DETECTED Final   Neisseria meningitidis NOT DETECTED NOT DETECTED Final   Pseudomonas aeruginosa NOT DETECTED NOT DETECTED Final   Stenotrophomonas maltophilia NOT DETECTED NOT DETECTED Final   Candida albicans NOT DETECTED NOT DETECTED Final   Candida auris NOT DETECTED NOT DETECTED Final   Candida glabrata NOT DETECTED NOT DETECTED Final   Candida krusei NOT DETECTED NOT DETECTED Final   Candida parapsilosis NOT DETECTED NOT DETECTED Final   Candida tropicalis NOT DETECTED NOT DETECTED Final   Cryptococcus neoformans/gattii NOT DETECTED NOT DETECTED Final    Comment: Performed at [facility], [address], [place], [place] [address]  Culture, blood (routine x 2)     Status: None   Collection Time: 12/05/20  1:59 PM   Specimen: BLOOD RIGHT FOREARM  Result Value Ref Range Status   Specimen Description   Final    BLOOD RIGHT FOREARM Performed at [facility] [address], [place], [place] [address]    Special Requests   Final    BOTTLES DRAWN AEROBIC AND ANAEROBIC Blood Culture adequate volume Performed at [facility] [address], [place], [place] [address]    Culture   Final    NO GROWTH 5 DAYS Performed at [facility], [place] [address], [place], [place] [address]  Report Status 12/10/2020 FINAL  Final  Urine Culture     Status: Abnormal   Collection Time: 12/05/20  3:52 PM    Specimen: Urine, Random  Result Value Ref Range Status   Specimen Description   Final    URINE, RANDOM NEPHROSTOMY Performed at [facility] [address] [place], [place] [address]    Special Requests   Final    NONE Performed at [facility], [place] [address] [place], [place], [place] [address]    Culture 10,000 COLONIES/mL YEAST (A)  Final   Report Status 12/07/2020 FINAL  Final  Aerobic Culture w Gram Stain (superficial specimen)     Status: None   Collection Time: 12/05/20  4:25 PM   Specimen: JP Drain  Result Value Ref Range Status   Specimen Description   Final    JP DRAINAGE Performed at [facility] [address] [place], [place] [address]    Special Requests   Final    NONE Performed at [facility], [place] [address], [place], [place] [address]    Gram Stain   Final    NO SQUAMOUS EPITHELIAL CELLS PRESENT ABUNDANT WBC PRESENT,BOTH PMN AND MONONUCLEAR ABUNDANT GRAM NEGATIVE RODS Performed at [facility], [place] [address] [place], [place] [address]    Culture   Final    ABUNDANT ESCHERICHIA COLI MODERATE PSEUDOMONAS AERUGINOSA    Report Status 12/08/2020 FINAL  Final   Organism ID, Bacteria ESCHERICHIA COLI  Final   Organism ID, Bacteria PSEUDOMONAS AERUGINOSA  Final      Susceptibility   Escherichia coli - MIC*    AMPICILLIN >=32 RESISTANT Resistant     CEFAZOLIN <=4 SENSITIVE Sensitive     CEFEPIME <=0.12 SENSITIVE Sensitive     CEFTAZIDIME <=1 SENSITIVE Sensitive     CEFTRIAXONE <=0.25 SENSITIVE Sensitive     CIPROFLOXACIN <=0.25 SENSITIVE Sensitive     GENTAMICIN <=1 SENSITIVE Sensitive     IMIPENEM <=0.25 SENSITIVE Sensitive     TRIMETH/SULFA >=320 RESISTANT Resistant     AMPICILLIN/SULBACTAM >=32 RESISTANT Resistant     PIP/TAZO <=4 SENSITIVE Sensitive     * ABUNDANT ESCHERICHIA COLI   Pseudomonas aeruginosa - MIC*    CEFTAZIDIME 4 SENSITIVE Sensitive     CIPROFLOXACIN <=0.25  SENSITIVE Sensitive     GENTAMICIN <=1 SENSITIVE Sensitive     IMIPENEM 2 SENSITIVE Sensitive     PIP/TAZO <=4 SENSITIVE Sensitive     CEFEPIME 2 SENSITIVE Sensitive     * MODERATE PSEUDOMONAS AERUGINOSA  Aerobic/Anaerobic Culture w Gram Stain (surgical/deep wound)     Status: None (Preliminary result)   Collection Time: 12/13/20 11:05 AM   Specimen: Abscess  Result Value Ref Range Status   Specimen Description ABSCESS ABDOMEN  Final   Special Requests NONE  Final   Gram Stain   Final    MODERATE WBC PRESENT,BOTH PMN AND MONONUCLEAR ABUNDANT GRAM POSITIVE COCCI IN CLUSTERS FEW GRAM POSITIVE COCCI IN CHAINS MODERATE GRAM NEGATIVE RODS    Culture   Final    CULTURE REINCUBATED FOR BETTER GROWTH Performed at [facility], [address], [place], [place] [address]    Report Status PENDING  Incomplete          Radiology Studies: CT ABDOMEN PELVIS W CONTRAST  Result Date: 12/12/2020 CLINICAL DATA:  Abdominal abscess/infection suspected. History of stercoral or suppuration and perforation of sigmoid colon status post Hartmann procedure with sigmoid colonic resection and colostomy.  History of metastatic prostate cancer with bilateral percutaneous nephrostomy tubes EXAM: CT ABDOMEN AND PELVIS WITH CONTRAST TECHNIQUE: Multidetector CT imaging of the abdomen and pelvis was performed using the standard protocol following bolus administration of intravenous contrast. CONTRAST:  1mL OMNIPAQUE IOHEXOL 350 MG/ML SOLN COMPARISON:  Multiple priors including most recent CT abdomen pelvis December 04, 2020 FINDINGS: Lower chest: Decreased now small right greater than left pleural effusions with adjacent atelectasis versus consolidations. Hepatobiliary: Subcapsular fluid collections further described below. Gallbladder is unremarkable. No biliary ductal dilation. Pancreas: Within normal limits. Spleen: No splenomegaly. Adrenals/Urinary Tract: Bilateral adrenal glands are unremarkable. No  hydronephrosis. Bilateral percutaneous nephrostomy tubes remain in position with pigtails in the renal pelves. Bilateral double-J ureteral stents remain in position with pigtails in the renal pelves and urinary bladder. Severely thickened urinary bladder, similar prior. Stomach/Bowel: Stomach is within normal limits. No pathologic dilation of small or large bowel. Postsurgical changes of sigmoidectomy with Hartmann's pouch formation and left anterior abdominal wall colostomy formation. Vascular/Lymphatic: Aortic atherosclerosis without aneurysmal dilation. Unchanged appearance of the multiple enlarged retroperitoneal, bilateral iliac and pelvic sidewall lymph nodes. Reproductive: Nodular prostatomegaly, similar prior reflecting known prostate neoplasm. Other: Open midline laparotomy incision. Small volume of free abdominopelvic fluid with peritoneal thickening consistent with peritonitis. Multiple walled off fluid collections throughout the abdomen and pelvis seen in the abdomen. Including: -New left anterior abdominal gas and fluid collection measures 10.6 x 5.4 x 15 cm on image 56/2 and 43/5, with this collection extending to the anterior left abdominal wall colostomy site. This fluid collection has a thin connection on image 53/5 with the fluid in the pelvis which was measured on prior study and contains a portion of the anterior pelvic drainage catheter. -Subdiaphragmatic perisplenic fluid collection which measures approximately 7.5 x 9.3 x 8.3 cm on image 77/5 and 21/2, similar to prior similar prior. -subcapsular hepatic collection measuring 5 x 2.1 cm on image 19/2, additional subcapsular hepatic collection measuring 1.8 cm on image 26/2 and an additional bilobar collections along the inferior aspect of the right hila hepatic lobe measuring approximately 6.5 x 5.6 cm on image 82/5, . -Small fluid collections in the bilateral pericolic gutters measuring 2.4 and 1.7 cm on the right and left respectively on  image 60/2, similar to prior. Musculoskeletal: Anasarca. Similar multifocal sclerotic osseous metastases. IMPRESSION: 1. New abdominopelvic gas and fluid collection located in the left anterior abdomen demonstrating continuity with the pelvic fluid collection measured on prior study, most consistent with an abscess. The right anterior approach drainage catheter does extend through the pelvic portion of the collection however the tip is outside of the collection. 2. Peritonitis with multiple walled off fluid collections throughout the abdomen and pelvis, overall similar to prior. 3. Decreased now small right greater than left pleural effusions with adjacent atelectasis versus consolidations. 4. Open ventral incision with postsurgical changes of sigmoidectomy and Hartmann's pouch formation with left anterior abdominal wall colostomy formation. 5. Bilateral percutaneous nephrostomy tubes remain in position with pigtails in the renal pelves and urinary bladder. Bilateral double-J ureteral stents remain in position. No hydronephrosis. 6. Prostatomegaly with similar metastatic abdominopelvic lymph nodes and osseous metastases. 7.  Aortic Atherosclerosis (ICD10-I70.0). Electronically Signed   By: Dahlia Bailiff M.D.   On: 12/12/2020 17:06   CT IMAGE GUIDED FLUID DRAIN BY CATHETER  Result Date: 12/13/2020 INDICATION: Intra-abdominal abscess EXAM: CT-guided drain placement into intra-abdominal abscess MEDICATIONS: The patient is currently admitted to the hospital and receiving intravenous antibiotics. The antibiotics were administered within  an appropriate time frame prior to the initiation of the procedure. ANESTHESIA/SEDATION: Fentanyl 2 mcg IV; Versed 50 mg IV Moderate Sedation Time:  14 The patient was continuously monitored during the procedure by the interventional radiology nurse under my direct supervision. COMPLICATIONS: None immediate. PROCEDURE: Informed written consent was obtained from the patient after a  thorough discussion of the procedural risks, benefits and alternatives. All questions were addressed. Maximal Sterile Barrier Technique was utilized including caps, mask, sterile gowns, sterile gloves, sterile drape, hand hygiene and skin antiseptic. A timeout was performed prior to the initiation of the procedure. The patient was placed supine on the CT exam table. Limited CT of the abdomen was performed for planning purposes. This again demonstrated a large air fluid collection in the left lower quadrant around the ostomy, consistent with abscess. Skin entry site was marked, the overlying skin was prepped and draped in the standard sterile fashion. Local analgesia was obtained with 1% lidocaine. Using CT guidance, a 12 [place] multipurpose drainage catheter was advanced directly into the fluid collection using trocar technique. There is immediate return of purulent material. Approximate 15-20 mL was aspirated and sent to the lab sample for further analysis. The catheter was locked, and location was confirmed with CT. The catheter was secured to the skin using silk suture and a dressing. It was attached to bulb suction. The patient tolerated the procedure well without complication. IMPRESSION: Successful CT-guided placement of a 12 French locking drainage catheter into the intra-abdominal peristomal abscess. Electronically Signed   By: Albin Felling M.D.   On: 12/13/2020 12:37        Scheduled Meds:  (feeding supplement) PROSource Plus  30 mL Oral BID BM   acetaminophen (TYLENOL) oral liquid 160 mg/5 mL  650 mg Oral Q6H   vitamin C  1,000 mg Oral Daily   carvedilol  3.125 mg Oral BID WC   chlorhexidine  15 mL Mouth Rinse BID   Chlorhexidine Gluconate Cloth  6 each Topical Daily   furosemide  20 mg Intravenous BID   gabapentin  300 mg Oral BID   mouth rinse  15 mL Mouth Rinse q12n4p   methocarbamol  1,000 mg Oral TID   mirtazapine  7.5 mg Oral QHS   multivitamin with minerals  1 tablet Oral Daily    pantoprazole  40 mg Oral Daily   predniSONE  5 mg Oral Q breakfast   protein supplement shake  11 oz Oral TID   sodium chloride flush  5 mL Intracatheter Q8H   sodium hypochlorite   Irrigation TID   Continuous Infusions:  piperacillin-tazobactam (ZOSYN)  IV 3.375 g (12/14/20 0543)     LOS: 17 days    Time spent: 39 min   Georgette Shell, MD 12/14/2020, 2:01 PM

## 2020-12-14 NOTE — Progress Notes (Signed)
Referring Physician(s): Dr Shann Medal  Supervising Physician: Jacqulynn Cadet  Patient Status:  [facility] - In-pt  Chief Complaint:  Fecal impaction/Severe acute on chronic constipation/acute perforation after enema POD#15 s/p ex lap, sigmoid colectomy, end colostomy Dr. Kae Heller 8/25 Findings of large perforation with diffuse feculent peritonitis and extensive contamination of the peritoneal cavity with stool and pus  Subjective:  IR procedure yesterday Successful CT-guided placement of a 12 French locking drainage catheter into the intra-abdominal peristomal abscess.  Up in bed Eating some liquids Feels some better OP of IR drain is cloudy yellow   Allergies: Ibuprofen and Shrimp [shellfish allergy]  Medications: Prior to Admission medications   Medication Sig Start Date End Date Taking? Authorizing Provider  acetaminophen (TYLENOL) 325 MG tablet Take 2 tablets (650 mg total) by mouth every 6 (six) hours as needed for headache. 10/30/20  Yes Ezequiel Essex, MD  amLODipine (NORVASC) 10 MG tablet Take 1 tablet (10 mg total) by mouth daily. 10/05/20  Yes Rai, Ripudeep K, MD  feeding supplement (ENSURE ENLIVE / ENSURE PLUS) LIQD Take 237 mLs by mouth 3 (three) times daily between meals. 10/30/20  Yes Ezequiel Essex, MD  Iron-Vitamins (GERITOL COMPLETE) TABS Take 1 tablet by mouth daily.   Yes [provider]  lidocaine-prilocaine (EMLA) cream Apply 1 application topically as needed. 01/15/20  Yes [name], MD  mirtazapine (REMERON) 7.5 MG tablet TAKE 1 TABLET BY MOUTH AT BEDTIME. 11/21/20  Yes Gifford Shave, MD  Multiple Vitamin (MULTIVITAMIN WITH MINERALS) TABS tablet Take 1 tablet by mouth daily.   Yes [provider]  naloxegol oxalate (MOVANTIK) 12.5 MG TABS tablet Take 1 tablet (12.5 mg total) by mouth daily. Take it in the morning 1 hour before breakfast daily for constipation. 10/05/20  Yes Rai, Ripudeep K, MD  ondansetron (ZOFRAN) 4 MG tablet Take 1  tablet (4 mg total) by mouth 3 (three) times daily as needed for nausea or vomiting. 09/24/20  Yes Shadad, Mathis Dad, MD  Plecanatide (TRULANCE) 3 MG TABS Take 3 mg by mouth daily. 08/08/20  Yes [name], [name], DO  polyethylene glycol (MIRALAX) 17 g packet Take 17 g by mouth 2 (two) times daily. Patient taking differently: Take 17 g by mouth daily as needed for mild constipation. 11/06/20  Yes Alen Bleacher, MD  predniSONE (DELTASONE) 5 MG tablet TAKE 1 TABLET BY MOUTH EVERY DAY WITH BREAKFAST Patient taking differently: Take 5 mg by mouth daily with breakfast. 10/18/20  Yes Shadad, Mathis Dad, MD  pregabalin (LYRICA) 50 MG capsule Take 1 capsule (50 mg total) by mouth 2 (two) times daily. 10/30/20 12/29/20 Yes Ezequiel Essex, MD  prochlorperazine (COMPAZINE) 10 MG tablet Take 1 tablet (10 mg total) by mouth every 6 (six) hours as needed for nausea or vomiting. 09/24/20  Yes Shadad, Mathis Dad, MD  RIVAROXABAN Alveda Reasons) VTE STARTER PACK (15 & 20 MG) Follow package directions: Take one 15mg  tablet by mouth twice a day. On day 22, switch to one 20mg  tablet once a day. Take with food. Patient taking differently: Take 15-20 mg by mouth See admin instructions. Follow package directions: Take one 15mg  tablet by mouth twice a day. On day 22, switch to one 20mg  tablet once a day. Take with food. 11/20/20  Yes Lorin Glass, PA-C  tamsulosin (FLOMAX) 0.4 MG CAPS capsule TAKE 1 CAPSULE (0.4 MG TOTAL) BY MOUTH DAILY AS NEEDED (TO HELP WITH URINATION). 11/21/20  Yes Gifford Shave, MD  vitamin C (ASCORBIC ACID) 500 MG tablet  Take 1,000 mg by mouth daily.   Yes [provider]  linaclotide (LINZESS) 290 MCG CAPS capsule Take 1 capsule (290 mcg total) by mouth daily as needed (constipation, take before breakfast). Patient not taking: No sig reported 10/05/20   Rai, Vernelle Emerald, MD     Vital Signs: BP 124/61 (BP Location: Right Arm)   Pulse 92   Temp 99.1 F (37.3 C) (Oral)   Resp 17   Ht 5\' 8"  (1.727 m)   Wt  181 lb (82.1 kg)   SpO2 93%   BMI 27.52 kg/m   Physical Exam Skin:    General: Skin is warm.     Comments: Site of drain is c/d/I NT no bleeding OP is cloudy yellow Flushing well    Imaging: CT ABDOMEN PELVIS W CONTRAST  Result Date: 12/12/2020 CLINICAL DATA:  Abdominal abscess/infection suspected. History of stercoral or suppuration and perforation of sigmoid colon status post Hartmann procedure with sigmoid colonic resection and colostomy. History of metastatic prostate cancer with bilateral percutaneous nephrostomy tubes EXAM: CT ABDOMEN AND PELVIS WITH CONTRAST TECHNIQUE: Multidetector CT imaging of the abdomen and pelvis was performed using the standard protocol following bolus administration of intravenous contrast. CONTRAST:  41mL OMNIPAQUE IOHEXOL 350 MG/ML SOLN COMPARISON:  Multiple priors including most recent CT abdomen pelvis December 04, 2020 FINDINGS: Lower chest: Decreased now small right greater than left pleural effusions with adjacent atelectasis versus consolidations. Hepatobiliary: Subcapsular fluid collections further described below. Gallbladder is unremarkable. No biliary ductal dilation. Pancreas: Within normal limits. Spleen: No splenomegaly. Adrenals/Urinary Tract: Bilateral adrenal glands are unremarkable. No hydronephrosis. Bilateral percutaneous nephrostomy tubes remain in position with pigtails in the renal pelves. Bilateral double-J ureteral stents remain in position with pigtails in the renal pelves and urinary bladder. Severely thickened urinary bladder, similar prior. Stomach/Bowel: Stomach is within normal limits. No pathologic dilation of small or large bowel. Postsurgical changes of sigmoidectomy with Hartmann's pouch formation and left anterior abdominal wall colostomy formation. Vascular/Lymphatic: Aortic atherosclerosis without aneurysmal dilation. Unchanged appearance of the multiple enlarged retroperitoneal, bilateral iliac and pelvic sidewall lymph nodes.  Reproductive: Nodular prostatomegaly, similar prior reflecting known prostate neoplasm. Other: Open midline laparotomy incision. Small volume of free abdominopelvic fluid with peritoneal thickening consistent with peritonitis. Multiple walled off fluid collections throughout the abdomen and pelvis seen in the abdomen. Including: -New left anterior abdominal gas and fluid collection measures 10.6 x 5.4 x 15 cm on image 56/2 and 43/5, with this collection extending to the anterior left abdominal wall colostomy site. This fluid collection has a thin connection on image 53/5 with the fluid in the pelvis which was measured on prior study and contains a portion of the anterior pelvic drainage catheter. -Subdiaphragmatic perisplenic fluid collection which measures approximately 7.5 x 9.3 x 8.3 cm on image 77/5 and 21/2, similar to prior similar prior. -subcapsular hepatic collection measuring 5 x 2.1 cm on image 19/2, additional subcapsular hepatic collection measuring 1.8 cm on image 26/2 and an additional bilobar collections along the inferior aspect of the right hila hepatic lobe measuring approximately 6.5 x 5.6 cm on image 82/5, . -Small fluid collections in the bilateral pericolic gutters measuring 2.4 and 1.7 cm on the right and left respectively on image 60/2, similar to prior. Musculoskeletal: Anasarca. Similar multifocal sclerotic osseous metastases. IMPRESSION: 1. New abdominopelvic gas and fluid collection located in the left anterior abdomen demonstrating continuity with the pelvic fluid collection measured on prior study, most consistent with an abscess. The right anterior approach  drainage catheter does extend through the pelvic portion of the collection however the tip is outside of the collection. 2. Peritonitis with multiple walled off fluid collections throughout the abdomen and pelvis, overall similar to prior. 3. Decreased now small right greater than left pleural effusions with adjacent atelectasis  versus consolidations. 4. Open ventral incision with postsurgical changes of sigmoidectomy and Hartmann's pouch formation with left anterior abdominal wall colostomy formation. 5. Bilateral percutaneous nephrostomy tubes remain in position with pigtails in the renal pelves and urinary bladder. Bilateral double-J ureteral stents remain in position. No hydronephrosis. 6. Prostatomegaly with similar metastatic abdominopelvic lymph nodes and osseous metastases. 7.  Aortic Atherosclerosis (ICD10-I70.0). Electronically Signed   By: Dahlia Bailiff M.D.   On: 12/12/2020 17:06   CT IMAGE GUIDED FLUID DRAIN BY CATHETER  Result Date: 12/13/2020 INDICATION: Intra-abdominal abscess EXAM: CT-guided drain placement into intra-abdominal abscess MEDICATIONS: The patient is currently admitted to the hospital and receiving intravenous antibiotics. The antibiotics were administered within an appropriate time frame prior to the initiation of the procedure. ANESTHESIA/SEDATION: Fentanyl 2 mcg IV; Versed 50 mg IV Moderate Sedation Time:  14 The patient was continuously monitored during the procedure by the interventional radiology nurse under my direct supervision. COMPLICATIONS: None immediate. PROCEDURE: Informed written consent was obtained from the patient after a thorough discussion of the procedural risks, benefits and alternatives. All questions were addressed. Maximal Sterile Barrier Technique was utilized including caps, mask, sterile gowns, sterile gloves, sterile drape, hand hygiene and skin antiseptic. A timeout was performed prior to the initiation of the procedure. The patient was placed supine on the CT exam table. Limited CT of the abdomen was performed for planning purposes. This again demonstrated a large air fluid collection in the left lower quadrant around the ostomy, consistent with abscess. Skin entry site was marked, the overlying skin was prepped and draped in the standard sterile fashion. Local analgesia was  obtained with 1% lidocaine. Using CT guidance, a 12 [place] multipurpose drainage catheter was advanced directly into the fluid collection using trocar technique. There is immediate return of purulent material. Approximate 15-20 mL was aspirated and sent to the lab sample for further analysis. The catheter was locked, and location was confirmed with CT. The catheter was secured to the skin using silk suture and a dressing. It was attached to bulb suction. The patient tolerated the procedure well without complication. IMPRESSION: Successful CT-guided placement of a 12 French locking drainage catheter into the intra-abdominal peristomal abscess. Electronically Signed   By: Albin Felling M.D.   On: 12/13/2020 12:37    Labs:  CBC: Recent Labs    12/10/20 0253 12/11/20 0438 12/12/20 0231 12/13/20 0403  WBC 19.6* 20.2* 21.1* 20.6*  HGB 8.2* 8.2* 7.9* 8.0*  HCT 26.4* 26.2* 25.2* 25.5*  PLT 569* 606* 831* 576*    COAGS: Recent Labs    11/27/20 1701 11/28/20 0044 11/28/20 0806 11/29/20 1020 12/13/20 0403  INR  --   --   --   --  1.3*  APTT 34 59* 51* 27  --     BMP: Recent Labs    12/09/20 0327 12/10/20 0253 12/11/20 0438 12/13/20 0403  NA 142 140 141 138  K 3.2* 3.8 4.4 3.8  CL 113* 110 113* 105  CO2 23 22 23 24   GLUCOSE 98 96 96 85  BUN 24* 25* 18 19  CALCIUM 8.6* 8.5* 8.7* 8.8*  CREATININE 0.80 0.78 0.80 0.84  GFRNONAA >60 >60 >60 >60  LIVER FUNCTION TESTS: Recent Labs    12/02/20 0353 12/03/20 0842 12/04/20 0327 12/05/20 0325 12/08/20 0321 12/09/20 0327 12/10/20 0253 12/13/20 0403  BILITOT 0.9 1.0 0.9  --   --   --   --  0.6  AST 41 36 33  --   --   --   --  27  ALT 15 18 14   --   --   --   --  12  ALKPHOS 116 119 119  --   --   --   --  196*  PROT 5.1* 4.8* 5.1*  --   --   --   --  6.9  ALBUMIN 1.7* 1.5* 1.4*   < > 2.0* 2.4* 2.1* 1.9*   < > = values in this interval not displayed.    Assessment and Plan:  LLQ abscess drain placed in IR 9/9--  intact Pt feeling some better today Will follow  Electronically Signed: Lavonia Drafts, PA-C 12/14/2020, 1:16 PM   I spent a total of 15 Minutes at the the patient's bedside AND on the patient's hospital floor or unit, greater than 50% of which was counseling/coordinating care for LLQ absc drain

## 2020-12-14 NOTE — Progress Notes (Signed)
16 Days Post-Op   Subjective/Chief Complaint: Pt sleepy but arousable  no distress     Objective: Vital signs in last 24 hours: Temp:  [98.2 F (36.8 C)-99.1 F (37.3 C)] 99.1 F (37.3 C) (09/10 0456) Pulse Rate:  [74-98] 92 (09/10 0456) Resp:  [14-20] 17 (09/10 0456) BP: (104-132)/(59-67) 124/61 (09/10 0456) SpO2:  [93 %-100 %] 93 % (09/10 0456) Weight:  [82.1 kg] 82.1 kg (09/10 0405) Last BM Date: 12/10/20  Intake/Output from previous day: 09/09 0701 - 09/10 0700 In: 200.2 [IV Piggyback:185.2] Out: 2290 [Urine:2000; Drains:270; Stool:20] Intake/Output this shift: No intake/output data recorded.   General appearance: sleepy but arousable  cooperative, no distress, and appears frail and tired. Resp: normal effort  GI: Midline wound with greenish drainage on dressing  Drainage in the JP is unchanged brownish colored fluid. Stoma viable and putting out stool  Lab Results:  Recent Labs    12/12/20 0231 12/13/20 0403  WBC 21.1* 20.6*  HGB 7.9* 8.0*  HCT 25.2* 25.5*  PLT 831* 576*   BMET Recent Labs    12/13/20 0403  NA 138  K 3.8  CL 105  CO2 24  GLUCOSE 85  BUN 19  CREATININE 0.84  CALCIUM 8.8*   PT/INR Recent Labs    12/13/20 0403  LABPROT 16.1*  INR 1.3*   ABG No results for input(s): PHART, HCO3 in the last 72 hours.  Invalid input(s): PCO2, PO2  Studies/Results: CT ABDOMEN PELVIS W CONTRAST  Result Date: 12/12/2020 CLINICAL DATA:  Abdominal abscess/infection suspected. History of stercoral or suppuration and perforation of sigmoid colon status post Hartmann procedure with sigmoid colonic resection and colostomy. History of metastatic prostate cancer with bilateral percutaneous nephrostomy tubes EXAM: CT ABDOMEN AND PELVIS WITH CONTRAST TECHNIQUE: Multidetector CT imaging of the abdomen and pelvis was performed using the standard protocol following bolus administration of intravenous contrast. CONTRAST:  45mL OMNIPAQUE IOHEXOL 350 MG/ML SOLN  COMPARISON:  Multiple priors including most recent CT abdomen pelvis December 04, 2020 FINDINGS: Lower chest: Decreased now small right greater than left pleural effusions with adjacent atelectasis versus consolidations. Hepatobiliary: Subcapsular fluid collections further described below. Gallbladder is unremarkable. No biliary ductal dilation. Pancreas: Within normal limits. Spleen: No splenomegaly. Adrenals/Urinary Tract: Bilateral adrenal glands are unremarkable. No hydronephrosis. Bilateral percutaneous nephrostomy tubes remain in position with pigtails in the renal pelves. Bilateral double-J ureteral stents remain in position with pigtails in the renal pelves and urinary bladder. Severely thickened urinary bladder, similar prior. Stomach/Bowel: Stomach is within normal limits. No pathologic dilation of small or large bowel. Postsurgical changes of sigmoidectomy with Hartmann's pouch formation and left anterior abdominal wall colostomy formation. Vascular/Lymphatic: Aortic atherosclerosis without aneurysmal dilation. Unchanged appearance of the multiple enlarged retroperitoneal, bilateral iliac and pelvic sidewall lymph nodes. Reproductive: Nodular prostatomegaly, similar prior reflecting known prostate neoplasm. Other: Open midline laparotomy incision. Small volume of free abdominopelvic fluid with peritoneal thickening consistent with peritonitis. Multiple walled off fluid collections throughout the abdomen and pelvis seen in the abdomen. Including: -New left anterior abdominal gas and fluid collection measures 10.6 x 5.4 x 15 cm on image 56/2 and 43/5, with this collection extending to the anterior left abdominal wall colostomy site. This fluid collection has a thin connection on image 53/5 with the fluid in the pelvis which was measured on prior study and contains a portion of the anterior pelvic drainage catheter. -Subdiaphragmatic perisplenic fluid collection which measures approximately 7.5 x 9.3 x 8.3 cm  on image 77/5 and 21/2, similar  to prior similar prior. -subcapsular hepatic collection measuring 5 x 2.1 cm on image 19/2, additional subcapsular hepatic collection measuring 1.8 cm on image 26/2 and an additional bilobar collections along the inferior aspect of the right hila hepatic lobe measuring approximately 6.5 x 5.6 cm on image 82/5, . -Small fluid collections in the bilateral pericolic gutters measuring 2.4 and 1.7 cm on the right and left respectively on image 60/2, similar to prior. Musculoskeletal: Anasarca. Similar multifocal sclerotic osseous metastases. IMPRESSION: 1. New abdominopelvic gas and fluid collection located in the left anterior abdomen demonstrating continuity with the pelvic fluid collection measured on prior study, most consistent with an abscess. The right anterior approach drainage catheter does extend through the pelvic portion of the collection however the tip is outside of the collection. 2. Peritonitis with multiple walled off fluid collections throughout the abdomen and pelvis, overall similar to prior. 3. Decreased now small right greater than left pleural effusions with adjacent atelectasis versus consolidations. 4. Open ventral incision with postsurgical changes of sigmoidectomy and Hartmann's pouch formation with left anterior abdominal wall colostomy formation. 5. Bilateral percutaneous nephrostomy tubes remain in position with pigtails in the renal pelves and urinary bladder. Bilateral double-J ureteral stents remain in position. No hydronephrosis. 6. Prostatomegaly with similar metastatic abdominopelvic lymph nodes and osseous metastases. 7.  Aortic Atherosclerosis (ICD10-I70.0). Electronically Signed   By: Dahlia Bailiff M.D.   On: 12/12/2020 17:06   CT IMAGE GUIDED FLUID DRAIN BY CATHETER  Result Date: 12/13/2020 INDICATION: Intra-abdominal abscess EXAM: CT-guided drain placement into intra-abdominal abscess MEDICATIONS: The patient is currently admitted to the  hospital and receiving intravenous antibiotics. The antibiotics were administered within an appropriate time frame prior to the initiation of the procedure. ANESTHESIA/SEDATION: Fentanyl 2 mcg IV; Versed 50 mg IV Moderate Sedation Time:  14 The patient was continuously monitored during the procedure by the interventional radiology nurse under my direct supervision. COMPLICATIONS: None immediate. PROCEDURE: Informed written consent was obtained from the patient after a thorough discussion of the procedural risks, benefits and alternatives. All questions were addressed. Maximal Sterile Barrier Technique was utilized including caps, mask, sterile gowns, sterile gloves, sterile drape, hand hygiene and skin antiseptic. A timeout was performed prior to the initiation of the procedure. The patient was placed supine on the CT exam table. Limited CT of the abdomen was performed for planning purposes. This again demonstrated a large air fluid collection in the left lower quadrant around the ostomy, consistent with abscess. Skin entry site was marked, the overlying skin was prepped and draped in the standard sterile fashion. Local analgesia was obtained with 1% lidocaine. Using CT guidance, a 12 [place] multipurpose drainage catheter was advanced directly into the fluid collection using trocar technique. There is immediate return of purulent material. Approximate 15-20 mL was aspirated and sent to the lab sample for further analysis. The catheter was locked, and location was confirmed with CT. The catheter was secured to the skin using silk suture and a dressing. It was attached to bulb suction. The patient tolerated the procedure well without complication. IMPRESSION: Successful CT-guided placement of a 12 French locking drainage catheter into the intra-abdominal peristomal abscess. Electronically Signed   By: Albin Felling M.D.   On: 12/13/2020 12:37    Anti-infectives: Anti-infectives (From admission, onward)    Start      Dose/Rate Route Frequency Ordered Stop   11/28/20 2230  piperacillin-tazobactam (ZOSYN) IVPB 3.375 g        3.375 g 12.5 mL/hr over 240  Minutes Intravenous Every 8 hours 11/28/20 2121     11/28/20 2215  piperacillin-tazobactam (ZOSYN) IVPB 3.375 g  Status:  Discontinued       Note to Pharmacy: Given Intraoperative   3.375 g 100 mL/hr over 30 Minutes Intravenous Every 8 hours 11/28/20 2117 11/28/20 2121   11/28/20 1730  piperacillin-tazobactam (ZOSYN) IVPB 3.375 g       Note to Pharmacy: Given Intraoperative   3.375 g 100 mL/hr over 30 Minutes Intravenous  Once 11/28/20 1729 11/28/20 1750   11/28/20 1712  piperacillin-tazobactam (ZOSYN) 3.375 GM/50ML IVPB       Note to Pharmacy: Marchia Meiers   : cabinet override      11/28/20 1712 11/28/20 1735   11/28/20 1545  ceFAZolin (ANCEF) IVPB 2g/100 mL premix        2 g 200 mL/hr over 30 Minutes Intravenous  Once 11/28/20 1538 11/28/20 1630   11/28/20 1539  ceFAZolin (ANCEF) 2-4 GM/100ML-% IVPB       Note to Pharmacy: Minor, Anneita   : cabinet override      11/28/20 1539 11/28/20 1654   11/27/20 1700  cephALEXin (KEFLEX) capsule 500 mg  Status:  Discontinued        500 mg Oral 3 times daily 11/27/20 1640 11/28/20 2117       Assessment/Plan: Fecal impaction/Severe acute on chronic constipation/acute perforation after enema POD#15 s/p ex lap, sigmoid colectomy, end colostomy Dr. Kae Heller 8/25 Findings of large perforation with diffuse feculent peritonitis and extensive contamination of the peritoneal cavity with stool and pus - CT 8/31 shows pelvic collection with surgical drain in good position, drainage is purulent appearing - continue drain and IV abx - CT 9/8  with collection in L abdomen around stoma - IR consulted for drainage of this, pt remains on Zosyn  - tolerating Dys 3 diet and calorie count in progress, discussed with RN to make sure premier protein shakes from home are being recorded as well - pain control seems better today   - WOC following, stoma looking more viable and functioning  - TID wet-to-dry dressing changes to midline wound, restart Dakin's x5d for pseudomonal colonization  - incentive spirometry, PT/OT - mobilize as able - surgical pathology confirms metastatic prostate cancer with positive LN   FEN: DYS 3 diet, calorie count >> soft diet ID: keflex. Zosyn 8/25>>  VTE: heparin gtt Follow up:  Dr. Kae Heller   Metastatic prostate cancer ABL anemia on anemia of chronic disease - hgb 8.0, stable DVT, b/l LE  Wide complex tachycardia 12/11/20 - K+ 4.4, Mag 2.0 Severe malnutrition - meeting 29% kcal need and 54% protein need 11/10/20 - prealbumin 9.9 - Nutrition recommending TF Soft diet for now  -  LOS: 17 days    Turner Daniels MD  12/14/2020

## 2020-12-15 LAB — CBC
HCT: 24.8 % — ABNORMAL LOW (ref 39.0–52.0)
Hemoglobin: 7.7 g/dL — ABNORMAL LOW (ref 13.0–17.0)
MCH: 30.3 pg (ref 26.0–34.0)
MCHC: 31 g/dL (ref 30.0–36.0)
MCV: 97.6 fL (ref 80.0–100.0)
Platelets: 555 10*3/uL — ABNORMAL HIGH (ref 150–400)
RBC: 2.54 MIL/uL — ABNORMAL LOW (ref 4.22–5.81)
RDW: 19.1 % — ABNORMAL HIGH (ref 11.5–15.5)
WBC: 19.4 10*3/uL — ABNORMAL HIGH (ref 4.0–10.5)
nRBC: 0 % (ref 0.0–0.2)

## 2020-12-15 LAB — COMPREHENSIVE METABOLIC PANEL
ALT: 14 U/L (ref 0–44)
AST: 28 U/L (ref 15–41)
Albumin: 1.8 g/dL — ABNORMAL LOW (ref 3.5–5.0)
Alkaline Phosphatase: 155 U/L — ABNORMAL HIGH (ref 38–126)
Anion gap: 8 (ref 5–15)
BUN: 20 mg/dL (ref 8–23)
CO2: 25 mmol/L (ref 22–32)
Calcium: 8.9 mg/dL (ref 8.9–10.3)
Chloride: 108 mmol/L (ref 98–111)
Creatinine, Ser: 0.83 mg/dL (ref 0.61–1.24)
GFR, Estimated: >60 mL/min (ref >60)
Glucose, Bld: 105 mg/dL — ABNORMAL HIGH (ref 70–99)
Potassium: 3.6 mmol/L (ref 3.5–5.1)
Sodium: 141 mmol/L (ref 135–145)
Total Bilirubin: 0.4 mg/dL (ref 0.3–1.2)
Total Protein: 7.1 g/dL (ref 6.5–8.1)

## 2020-12-15 NOTE — Progress Notes (Signed)
PROGRESS NOTE    Andre Glass  LNL:[id number] DOB: 04-22-1950 DOA: 11/27/2020 PCP: [name], DO   Brief Narrative: 70 year old male with a history of metastatic prostate cancer, chronic bilateral nephrostomies due to obstructive uropathy from underlying cancer, was admitted to the hospital with right-sided abdominal pain.  Initial CT scan performed did not show any specific obstructive pathology in the bowel.  He was initially seen by GI and general surgery they recommended continue bowel regimen and continued observation.  CT also showed bilateral DVTs and he was started on anticoagulation.  The patient was receiving enemas and unfortunately developed perforation of colon with peritonitis.  He was emergently taken to the operating room and underwent laparotomy, partial colectomy and colostomy.  Biopsies from surgery confirmed metastatic prostate cancer.  He has a JP drain in place and there is concern for underlying abscess.  He is currently on IV antibiotics.  Postoperative recovery has been slow due to his multiple medical issues.  He was seen by both oncology and palliative care.  Patient wishes to continue with current treatments with hopes for improvement.  Assessment & Plan:   Principal Problem:   Stercoral perforation of colon s/p Hartmann colectomy/colostomy 11/28/2020 Active Problems:   Metastatic castration-resistant adenocarcinoma of prostate (HCC)   BPH (benign prostatic hyperplasia)   Symptomatic anemia   Fecal impaction (HCC)   Hypokalemia   Hypomagnesemia   DVT of deep femoral vein, left (HCC)   Malnutrition of moderate degree   Fecal peritonitis ([place])   #1 fecal impaction with acute on chronic constipation and stercoral ulcer with perforation resulting in peritonitis status post exploratory laparotomy colectomy and end colostomy on 11/28/2020 with by Dr. Windle Guard.  He presented with a 3-day history of fecal impaction in the setting of prostate cancer and narcotics at home.   He did not respond to stool softeners and laxatives. GI consulted recommended enemas Patient subsequently became hypotensive with some guarding and rigidity of his abdomen with diaphoresis.  Stat KUB showed possible free air.  General surgery was reconsulted who took him to the OR on an emergent basis.  He underwent exploratory laparotomy sigmoid colectomy and end colostomy.  Surgical pathology showed metastatic prostate cancer. Palliative care care has been consulted patient remains full code and all aggressive measures to be taken.  #2 Acute b/l lower extremity DVT -patient continues to have active bleeding and right nephrostomy tubes with hematuria.  With active bleeding and poor prognosis will DC heparin.    Discussed with Dr. [name].  #3 metastatic prostate cancer oncology has been consulted with no  new recommendations currently.  #4 acute on chronic anemia he received 4 units of packed RBCs during his hospital stay.   #5 bilateral nephrostomy tubes with hematuria resolved once he was off the heparin drip.  #6 moderate malnutrition-his p.o. intake has been poor on dysphagia 3 diet.  He reports improvement in his appetite and eating better.  #7 goals of care patient with extreme poor prognosis due to metastatic prostate cancer and bowel perforation with peritonitis, patient remains a full code after multiple discussions with patient and his brother.  Palliative care following.   Nutrition Problem: Moderate Malnutrition Etiology: chronic illness, cancer and cancer related treatments     Signs/Symptoms: mild fat depletion, mild muscle depletion, moderate muscle depletion    Interventions: Prostat, Premier Protein, MVI  Estimated body mass index is 27.49 kg/m as calculated from the following:   Height as of this encounter: 5\' 8"  (1.727 m).  Weight as of this encounter: 82 kg.  DVT prophylaxis: None due to ongoing bleeding requiring blood transfusion code Status: Full code   family Communication: None at bedside  Disposition Plan:  Status is: Inpatient  Remains inpatient appropriate because:Hemodynamically unstable   Dispo: The patient is from: Home              Anticipated d/c is to: SNF              Patient currently is not medically stable to d/c.   Difficult to place patient Yes       Consultants:  General surgery, GI, urology, oncology, palliative care  Procedures: Exploratory laparotomy sigmoid colectomy and end colostomy as an emergent procedure CT-guided left lower quadrant drain placed on 12/12/2020 for intra-abdominal abscess Antimicrobials:  Anti-infectives (From admission, onward)    Start     Dose/Rate Route Frequency Ordered Stop   11/28/20 2230  piperacillin-tazobactam (ZOSYN) IVPB 3.375 g        3.375 g 12.5 mL/hr over 240 Minutes Intravenous Every 8 hours 11/28/20 2121     11/28/20 2215  piperacillin-tazobactam (ZOSYN) IVPB 3.375 g  Status:  Discontinued       Note to Pharmacy: Given Intraoperative   3.375 g 100 mL/hr over 30 Minutes Intravenous Every 8 hours 11/28/20 2117 11/28/20 2121   11/28/20 1730  piperacillin-tazobactam (ZOSYN) IVPB 3.375 g       Note to Pharmacy: Given Intraoperative   3.375 g 100 mL/hr over 30 Minutes Intravenous  Once 11/28/20 1729 11/28/20 1750   11/28/20 1712  piperacillin-tazobactam (ZOSYN) 3.375 GM/50ML IVPB       Note to Pharmacy: Marchia Meiers   : cabinet override      11/28/20 1712 11/28/20 1735   11/28/20 1545  ceFAZolin (ANCEF) IVPB 2g/100 mL premix        2 g 200 mL/hr over 30 Minutes Intravenous  Once 11/28/20 1538 11/28/20 1630   11/28/20 1539  ceFAZolin (ANCEF) 2-4 GM/100ML-% IVPB       Note to Pharmacy: Minor, Anneita   : cabinet override      11/28/20 1539 11/28/20 1654   11/27/20 1700  cephALEXin (KEFLEX) capsule 500 mg  Status:  Discontinued        500 mg Oral 3 times daily 11/27/20 1640 11/28/20 2117        Subjective: Patient is resting in bed He looks a bit better than  yesterday but still very weak he has not gotten out of bed or sat in the recliner yet he thinks he is too weak to do that   CT-guided placement of drainage catheter in the left lower quadrant intra abdominal peristomal abscess on 12/12/2020.  Heparin was stopped 12/12/2020 hematuria has cleared up no further bleeding noted from the abdominal dressings  CT abdomen 12/12/2020 with new abdominal pelvic gas and fluid collection located in the left anterior abdomen demonstrating a continue to with the pelvic fluid collection most consistent with an abscess.  Multiple walled off fluid collections throughout the abdomen and pelvis.  Bilateral nephrostomy tubes in position with pigtails in the renal pelvis and urinary bladder with bilateral double-J ureteral stents remain in position no hydronephrosis  objective: Vitals:   12/15/20 0100 12/15/20 0500 12/15/20 0620 12/15/20 1318  BP:   117/64 104/62  Pulse:   90 76  Resp:   18 18  Temp: 98.1 F (36.7 C)  99 F (37.2 C) 98.6 F (37 C)  TempSrc: Oral  Oral Oral  SpO2:   98% 99%  Weight:  82 kg    Height:        Intake/Output Summary (Last 24 hours) at 12/15/2020 1352 Last data filed at 12/15/2020 1138 Gross per 24 hour  Intake 950 ml  Output 3235 ml  Net -2285 ml    Filed Weights   12/13/20 0423 12/14/20 0405 12/15/20 0500  Weight: 81.4 kg 82.1 kg 82 kg    Examination:  General exam: Appears calm and comfortable  Respiratory system: Clear to auscultation. Respiratory effort normal. Cardiovascular system: S1 & S2 heard, RRR. No JVD, murmurs, rubs, gallops or clicks. No pedal edema. Gastrointestinal system: Abdomen is nondistended, soft and nontender. No organomegaly or masses felt. Normal bowel sounds heard.  And covered with dressing.  Bilateral nephrostomy tubes, colostomy with liquid stool and gas, JP drain in place Central nervous system: Alert and oriented. No focal neurological deficits. Extremities: 2+ edema Skin: No rashes, lesions  or ulcers Psychiatry: Judgement and insight appear normal. Mood & affect appropriate.     Data Reviewed: I have personally reviewed following labs and imaging studies  CBC: Recent Labs  Lab 12/10/20 0253 12/11/20 0438 12/12/20 0231 12/13/20 0403 12/15/20 0858  WBC 19.6* 20.2* 21.1* 20.6* 19.4*  HGB 8.2* 8.2* 7.9* 8.0* 7.7*  HCT 26.4* 26.2* 25.2* 25.5* 24.8*  MCV 96.7 96.3 96.6 96.2 97.6  PLT 569* 606* 831* 576* 555*    Basic Metabolic Panel: Recent Labs  Lab 12/09/20 0327 12/10/20 0253 12/11/20 0438 12/13/20 0403 12/15/20 0858  NA 142 140 141 138 141  K 3.2* 3.8 4.4 3.8 3.6  CL 113* 110 113* 105 108  CO2 23 22 23 24 25   GLUCOSE 98 96 96 85 105*  BUN 24* 25* 18 19 20   CREATININE 0.80 0.78 0.80 0.84 0.83  CALCIUM 8.6* 8.5* 8.7* 8.8* 8.9  MG  --  1.8 2.0  --   --   PHOS 2.8 2.9  --   --   --     GFR: Estimated Creatinine Clearance: 80.1 mL/min (by C-G formula based on SCr of 0.83 mg/dL). Liver Function Tests: Recent Labs  Lab 12/09/20 0327 12/10/20 0253 12/13/20 0403 12/15/20 0858  AST  --   --  27 28  ALT  --   --  12 14  ALKPHOS  --   --  196* 155*  BILITOT  --   --  0.6 0.4  PROT  --   --  6.9 7.1  ALBUMIN 2.4* 2.1* 1.9* 1.8*    No results for input(s): LIPASE, AMYLASE in the last 168 hours. No results for input(s): AMMONIA in the last 168 hours. Coagulation Profile: Recent Labs  Lab 12/13/20 0403  INR 1.3*    Cardiac Enzymes: No results for input(s): CKTOTAL, CKMB, CKMBINDEX, TROPONINI in the last 168 hours. BNP (last 3 results) No results for input(s): PROBNP in the last 8760 hours. HbA1C: No results for input(s): HGBA1C in the last 72 hours. CBG: No results for input(s): GLUCAP in the last 168 hours. Lipid Profile: No results for input(s): CHOL, HDL, LDLCALC, TRIG, CHOLHDL, LDLDIRECT in the last 72 hours. Thyroid Function Tests: No results for input(s): TSH, T4TOTAL, FREET4, T3FREE, THYROIDAB in the last 72 hours. Anemia Panel: No  results for input(s): VITAMINB12, FOLATE, FERRITIN, TIBC, IRON, RETICCTPCT in the last 72 hours. Sepsis Labs: No results for input(s): PROCALCITON, LATICACIDVEN in the last 168 hours.  Recent Results (from the past 240 hour(s))  Culture, blood (routine x 2)  Status: None   Collection Time: 12/05/20  1:59 PM   Specimen: BLOOD RIGHT FOREARM  Result Value Ref Range Status   Specimen Description   Final    BLOOD RIGHT FOREARM Performed at [facility] [address] [place], [place] [address]    Special Requests   Final    BOTTLES DRAWN AEROBIC AND ANAEROBIC Blood Culture adequate volume Performed at [facility] [address] [place], [place] [address]    Culture   Final    NO GROWTH 5 DAYS Performed at [facility], [place] [address], [place], [place] [address]    Report Status 12/10/2020 FINAL  Final  Urine Culture     Status: Abnormal   Collection Time: 12/05/20  3:52 PM   Specimen: Urine, Random  Result Value Ref Range Status   Specimen Description   Final    URINE, RANDOM NEPHROSTOMY Performed at [facility] [address], [place], [place] [address]    Special Requests   Final    NONE Performed at [facility], [place] [address] [place], [place] [address]    Culture 10,000 COLONIES/mL YEAST (A)  Final   Report Status 12/07/2020 FINAL  Final  Aerobic Culture w Gram Stain (superficial specimen)     Status: None   Collection Time: 12/05/20  4:25 PM   Specimen: JP Drain  Result Value Ref Range Status   Specimen Description   Final    JP DRAINAGE Performed at [facility] [address] [place], [place] [address]    Special Requests   Final    NONE Performed at [facility], [place] [address], [place], [place] [address]    Gram Stain   Final    NO SQUAMOUS EPITHELIAL CELLS PRESENT ABUNDANT WBC PRESENT,BOTH PMN AND MONONUCLEAR ABUNDANT GRAM  NEGATIVE RODS Performed at [facility], [place] [address], [place], [place] [address]    Culture   Final    ABUNDANT ESCHERICHIA COLI MODERATE PSEUDOMONAS AERUGINOSA    Report Status 12/08/2020 FINAL  Final   Organism ID, Bacteria ESCHERICHIA COLI  Final   Organism ID, Bacteria PSEUDOMONAS AERUGINOSA  Final      Susceptibility   Escherichia coli - MIC*    AMPICILLIN >=32 RESISTANT Resistant     CEFAZOLIN <=4 SENSITIVE Sensitive     CEFEPIME <=0.12 SENSITIVE Sensitive     CEFTAZIDIME <=1 SENSITIVE Sensitive     CEFTRIAXONE <=0.25 SENSITIVE Sensitive     CIPROFLOXACIN <=0.25 SENSITIVE Sensitive     GENTAMICIN <=1 SENSITIVE Sensitive     IMIPENEM <=0.25 SENSITIVE Sensitive     TRIMETH/SULFA >=320 RESISTANT Resistant     AMPICILLIN/SULBACTAM >=32 RESISTANT Resistant     PIP/TAZO <=4 SENSITIVE Sensitive     * ABUNDANT ESCHERICHIA COLI   Pseudomonas aeruginosa - MIC*    CEFTAZIDIME 4 SENSITIVE Sensitive     CIPROFLOXACIN <=0.25 SENSITIVE Sensitive     GENTAMICIN <=1 SENSITIVE Sensitive     IMIPENEM 2 SENSITIVE Sensitive     PIP/TAZO <=4 SENSITIVE Sensitive     CEFEPIME 2 SENSITIVE Sensitive     * MODERATE PSEUDOMONAS AERUGINOSA  Aerobic/Anaerobic Culture w Gram Stain (surgical/deep wound)     Status: None (Preliminary result)   Collection Time: 12/13/20 11:05 AM   Specimen: Abscess  Result Value Ref Range Status   Specimen Description ABSCESS ABDOMEN  Final   Special Requests NONE  Final   Gram Stain  Final    MODERATE WBC PRESENT,BOTH PMN AND MONONUCLEAR ABUNDANT GRAM POSITIVE COCCI IN CLUSTERS FEW GRAM POSITIVE COCCI IN CHAINS MODERATE GRAM NEGATIVE RODS    Culture   Final    CULTURE REINCUBATED FOR BETTER GROWTH Performed at [facility], [place] [address] [place],  [address]    Report Status PENDING  Incomplete          Radiology Studies: No results found.      Scheduled Meds:  (feeding supplement) PROSource Plus  30 mL Oral BID BM    acetaminophen (TYLENOL) oral liquid 160 mg/5 mL  650 mg Oral Q6H   vitamin C  1,000 mg Oral Daily   carvedilol  3.125 mg Oral BID WC   chlorhexidine  15 mL Mouth Rinse BID   Chlorhexidine Gluconate Cloth  6 each Topical Daily   furosemide  20 mg Intravenous BID   gabapentin  300 mg Oral BID   mouth rinse  15 mL Mouth Rinse q12n4p   methocarbamol  1,000 mg Oral TID   mirtazapine  7.5 mg Oral QHS   multivitamin with minerals  1 tablet Oral Daily   pantoprazole  40 mg Oral Daily   predniSONE  5 mg Oral Q breakfast   protein supplement shake  11 oz Oral TID   sodium chloride flush  5 mL Intracatheter Q8H   sodium hypochlorite   Irrigation TID   Continuous Infusions:  piperacillin-tazobactam (ZOSYN)  IV 3.375 g (12/15/20 0556)     LOS: 18 days    Time spent: 39 min   Georgette Shell, MD 12/15/2020, 1:52 PM

## 2020-12-15 NOTE — Progress Notes (Signed)
Daily Progress Note   Patient Name: Andre Glass       Date: 12/15/2020 DOB: 07/27/1950  Age: 70 y.o. MRN#: [id number] Attending Physician: Georgette Shell, MD Primary Care Physician: [name], DO Admit Date: 11/27/2020  Reason for Consultation/Follow-up: Establishing goals of care  Subjective: I saw and examined Andre Glass today.  He was watching football with another family member.  He tells me that currently his pain is controlled and he is not feeling nauseous.  States that he has had increased intake today. Length of Stay: 18  Current Medications: Scheduled Meds:   (feeding supplement) PROSource Plus  30 mL Oral BID BM   acetaminophen (TYLENOL) oral liquid 160 mg/5 mL  650 mg Oral Q6H   vitamin C  1,000 mg Oral Daily   carvedilol  3.125 mg Oral BID WC   chlorhexidine  15 mL Mouth Rinse BID   Chlorhexidine Gluconate Cloth  6 each Topical Daily   furosemide  20 mg Intravenous BID   gabapentin  300 mg Oral BID   mouth rinse  15 mL Mouth Rinse q12n4p   methocarbamol  1,000 mg Oral TID   mirtazapine  7.5 mg Oral QHS   multivitamin with minerals  1 tablet Oral Daily   pantoprazole  40 mg Oral Daily   predniSONE  5 mg Oral Q breakfast   protein supplement shake  11 oz Oral TID   sodium chloride flush  5 mL Intracatheter Q8H   sodium hypochlorite   Irrigation TID    Continuous Infusions:  piperacillin-tazobactam (ZOSYN)  IV 3.375 g (12/15/20 0556)    PRN Meds: alum & mag hydroxide-simeth, fentaNYL, HYDROmorphone (DILAUDID) injection, lidocaine (PF), lidocaine (PF), midazolam, midazolam, ondansetron **OR** ondansetron (ZOFRAN) IV, oxyCODONE, prochlorperazine, sodium chloride flush, tamsulosin  Physical Exam    General: Awake and alert.  He is still very weak overall,  though. HEENT: No bruits, no goiter, no JVD Heart: Regular rate and rhythm. No murmur appreciated. Lungs: Good air movement, clear Abdomen: Soft, globally tender, multiple drains  Ext: No significant edema Skin: Warm and dry       Vital Signs: BP 117/64   Pulse 90   Temp 99 F (37.2 C) (Oral)   Resp 18   Ht 5\' 8"  (1.727 m)   Wt 82 kg  SpO2 98%   BMI 27.49 kg/m  SpO2: SpO2: 98 % O2 Device: O2 Device: Room Air O2 Flow Rate: O2 Flow Rate (L/min): 2 L/min  Intake/output summary:  Intake/Output Summary (Last 24 hours) at 12/15/2020 0811 Last data filed at 12/15/2020 0600 Gross per 24 hour  Intake --  Output 3235 ml  Net -3235 ml    LBM: Last BM Date: 12/10/20 Baseline Weight: Weight: 83.5 kg Most recent weight: Weight: 82 kg       Palliative Assessment/Data:    Flowsheet Rows    Flowsheet Row Most Recent Value  Intake Tab   Referral Department Hospitalist  Unit at Time of Referral Med/Surg Unit  Palliative Care Primary Diagnosis Sepsis/Infectious Disease  Date Notified 11/27/20  Palliative Care Type New Palliative care  Reason for referral Clarify Goals of Care  Date of Admission 11/27/20  # of days IP prior to Palliative referral 0  Clinical Assessment   Psychosocial & Spiritual Assessment   Palliative Care Outcomes        Patient Active Problem List   Diagnosis Date Noted   Stercoral perforation of colon s/p Hartmann colectomy/colostomy 11/28/2020 12/08/2020   Fecal peritonitis ([place]) 12/08/2020   Malnutrition of moderate degree 12/05/2020   Fecal impaction ([place]) 11/27/2020   Hypokalemia 11/27/2020   Hypomagnesemia 11/27/2020   DVT of deep femoral vein, left (HCC) 11/27/2020   Abdominal pain    Severe protein-calorie malnutrition ([place]) 10/21/2020   Acute cystitis without hematuria    Anemia    Bacteremia due to Klebsiella pneumoniae    Altered mental status 10/18/2020   Myoclonus    Symptomatic anemia 10/17/2020   AKI (acute kidney injury) ([place])  09/28/2020   Hydronephrosis 09/28/2020   Obstructive uropathy 09/28/2020   Intractable pain 01/18/2020   BPH (benign prostatic hyperplasia) 11/08/2019   Catheter-associated urinary tract infection ([place]) 09/14/2019   Drug induced constipation 07/18/2019   Goals of care, counseling/discussion 06/26/2019   Metastatic castration-resistant adenocarcinoma of prostate ([place]) 04/19/2019    Palliative Care Assessment & Plan   Patient Profile: 70 y.o. male  with past medical history of metastatic prostate cancer with obstructive uropathy requiring nephrostomy tube placement, recent CT with concern for malignant stricture and intra-abdominal metastatic disease, bilateral lower extremity DVT admitted on 11/27/2020 with abdominal pain with subsequent worsening and ex lap/ostomy performed urgently yesterday.  Palliative consulted for goals of care.  Recommendations/Plan: Full code/full scope He feels his intake is improving.  Discussed again concerns about long-term nutrition and his hope that consideration for a feeding tube can be avoided.  Goals of Care and Additional Recommendations: Limitations on Scope of Treatment: Full Scope Treatment  Code Status:    Code Status Orders  (From admission, onward)           Start     Ordered   11/27/20 1638  Full code  Continuous        11/27/20 1640           Code Status History     Date Active Date Inactive Code Status Order ID Comments User Context   10/18/2020 0221 10/30/2020 2104 Full Code [id number]  Eppie Gibson, MD Inpatient   09/28/2020 2319 10/05/2020 1653 Full Code [id number]  Elwyn Reach, MD ED   01/18/2020 1135 01/19/2020 2316 Full Code [id number]  Jonnie Finner, DO Inpatient   11/08/2019 1331 11/09/2019 1732 Full Code [id number]  [name], MD Inpatient      Advance Directive Documentation  Flowsheet Row Most Recent Value  Type of Advance Directive Healthcare Power of Attorney  Pre-existing out of facility DNR  order (yellow form or pink MOST form) --  "MOST" Form in Place? --       Prognosis:  Guarded  Discharge Planning: To Be Determined  Care plan was discussed with patient  Thank you for allowing the Palliative Medicine Team to assist in the care of this patient.   Total Time 20 Prolonged Time Billed No    Greater than 50%  of this time was spent counseling and coordinating care related to the above assessment and plan.   Please contact Palliative Medicine Team phone at [phone] for questions and concerns.

## 2020-12-15 NOTE — Progress Notes (Signed)
17 Days Post-Op   Subjective/Chief Complaint: None Patient awake and alert.  Eating better.  He had a partial [employer] biscuit today and seemed to enjoy that.   Objective: Vital signs in last 24 hours: Temp:  [97.5 F (36.4 C)-99 F (37.2 C)] 99 F (37.2 C) (09/11 0620) Pulse Rate:  [71-90] 90 (09/11 0620) Resp:  [18] 18 (09/11 0620) BP: (106-117)/(53-71) 117/64 (09/11 0620) SpO2:  [96 %-98 %] 98 % (09/11 0620) Weight:  [82 kg] 82 kg (09/11 0500) Last BM Date: 12/10/20  Intake/Output from previous day: 09/10 0701 - 09/11 0700 In: -  Out: [place] [Urine:2250; Drains:35; Stool:950] Intake/Output this shift: No intake/output data recorded.   General appearance: sleepy but arousable  cooperative, no distress, and appears frail and tired. Resp: normal effort  GI: Midline wound with greenish drainage on dressing  Drainage in the JP is unchanged brownish colored fluid. Stoma viable and putting out stool  Lab Results:  Recent Labs    12/13/20 0403 12/15/20 0858  WBC 20.6* 19.4*  HGB 8.0* 7.7*  HCT 25.5* 24.8*  PLT 576* 555*   BMET Recent Labs    12/13/20 0403 12/15/20 0858  NA 138 141  K 3.8 3.6  CL 105 108  CO2 24 25  GLUCOSE 85 105*  BUN 19 20  CREATININE 0.84 0.83  CALCIUM 8.8* 8.9   PT/INR Recent Labs    12/13/20 0403  LABPROT 16.1*  INR 1.3*   ABG No results for input(s): PHART, HCO3 in the last 72 hours.  Invalid input(s): PCO2, PO2  Studies/Results: CT IMAGE GUIDED FLUID DRAIN BY CATHETER  Result Date: 12/13/2020 INDICATION: Intra-abdominal abscess EXAM: CT-guided drain placement into intra-abdominal abscess MEDICATIONS: The patient is currently admitted to the hospital and receiving intravenous antibiotics. The antibiotics were administered within an appropriate time frame prior to the initiation of the procedure. ANESTHESIA/SEDATION: Fentanyl 2 mcg IV; Versed 50 mg IV Moderate Sedation Time:  14 The patient was continuously monitored during the  procedure by the interventional radiology nurse under my direct supervision. COMPLICATIONS: None immediate. PROCEDURE: Informed written consent was obtained from the patient after a thorough discussion of the procedural risks, benefits and alternatives. All questions were addressed. Maximal Sterile Barrier Technique was utilized including caps, mask, sterile gowns, sterile gloves, sterile drape, hand hygiene and skin antiseptic. A timeout was performed prior to the initiation of the procedure. The patient was placed supine on the CT exam table. Limited CT of the abdomen was performed for planning purposes. This again demonstrated a large air fluid collection in the left lower quadrant around the ostomy, consistent with abscess. Skin entry site was marked, the overlying skin was prepped and draped in the standard sterile fashion. Local analgesia was obtained with 1% lidocaine. Using CT guidance, a 12 [place] multipurpose drainage catheter was advanced directly into the fluid collection using trocar technique. There is immediate return of purulent material. Approximate 15-20 mL was aspirated and sent to the lab sample for further analysis. The catheter was locked, and location was confirmed with CT. The catheter was secured to the skin using silk suture and a dressing. It was attached to bulb suction. The patient tolerated the procedure well without complication. IMPRESSION: Successful CT-guided placement of a 12 French locking drainage catheter into the intra-abdominal peristomal abscess. Electronically Signed   By: Albin Felling M.D.   On: 12/13/2020 12:37    Anti-infectives: Anti-infectives (From admission, onward)    Start     Dose/Rate Route Frequency Ordered  Stop   11/28/20 2230  piperacillin-tazobactam (ZOSYN) IVPB 3.375 g        3.375 g 12.5 mL/hr over 240 Minutes Intravenous Every 8 hours 11/28/20 2121     11/28/20 2215  piperacillin-tazobactam (ZOSYN) IVPB 3.375 g  Status:  Discontinued       Note  to Pharmacy: Given Intraoperative   3.375 g 100 mL/hr over 30 Minutes Intravenous Every 8 hours 11/28/20 2117 11/28/20 2121   11/28/20 1730  piperacillin-tazobactam (ZOSYN) IVPB 3.375 g       Note to Pharmacy: Given Intraoperative   3.375 g 100 mL/hr over 30 Minutes Intravenous  Once 11/28/20 1729 11/28/20 1750   11/28/20 1712  piperacillin-tazobactam (ZOSYN) 3.375 GM/50ML IVPB       Note to Pharmacy: Marchia Meiers   : cabinet override      11/28/20 1712 11/28/20 1735   11/28/20 1545  ceFAZolin (ANCEF) IVPB 2g/100 mL premix        2 g 200 mL/hr over 30 Minutes Intravenous  Once 11/28/20 1538 11/28/20 1630   11/28/20 1539  ceFAZolin (ANCEF) 2-4 GM/100ML-% IVPB       Note to Pharmacy: Minor, Anneita   : cabinet override      11/28/20 1539 11/28/20 1654   11/27/20 1700  cephALEXin (KEFLEX) capsule 500 mg  Status:  Discontinued        500 mg Oral 3 times daily 11/27/20 1640 11/28/20 2117       Assessment/Plan: s/p Procedure(s): EXPLORATORY LAPAROTOMY/ SIGMOID COLECTOMY (N/A) COLOSTOMY (N/A) Fecal impaction/Severe acute on chronic constipation/acute perforation after enema POD#16 s/p ex lap, sigmoid colectomy, end colostomy Dr. Kae Heller 8/25 Findings of large perforation with diffuse feculent peritonitis and extensive contamination of the peritoneal cavity with stool and pus - CT 8/31 shows pelvic collection with surgical drain in good position, drainage is purulent appearing - continue drain and IV abx - CT 9/8  with collection in L abdomen around stoma - IR consulted for drainage of this, pt remains on Zosyn  - tolerating Dys 3 diet and calorie count in progress, discussed with RN to make sure premier protein shakes from home are being recorded as well - pain control seems better today  - WOC following, stoma looking more viable and functioning  - TID wet-to-dry dressing changes to midline wound, restart Dakin's x5d for pseudomonal colonization  - incentive spirometry, PT/OT -  mobilize as able - surgical pathology confirms metastatic prostate cancer with positive LN   FEN: DYS 3 diet, calorie count >> soft diet ID: keflex. Zosyn 8/25>>  VTE: heparin gtt Follow up:  Dr. Kae Heller   Metastatic prostate cancer ABL anemia on anemia of chronic disease - hgb 8.0, stable DVT, b/l LE  Wide complex tachycardia 12/11/20 - K+ 4.4, Mag 2.0 Severe malnutrition - meeting 29% kcal need and 54% protein need 11/10/20 - prealbumin 9.9 - Nutrition recommending TF patient refusing.  He seems to be getting better.  May need calorie count to sort out. Soft diet for now   LOS: 18 days    Turner Daniels MD 12/15/2020

## 2020-12-16 ENCOUNTER — Encounter: Payer: Medicare Other | Admitting: Surgery

## 2020-12-16 ENCOUNTER — Other Ambulatory Visit: Payer: Self-pay | Admitting: Family Medicine

## 2020-12-16 DIAGNOSIS — L8990 Pressure ulcer of unspecified site, unspecified stage: Secondary | ICD-10-CM | POA: Insufficient documentation

## 2020-12-16 LAB — CULTURE, BLOOD (ROUTINE X 2): Special Requests: ADEQUATE

## 2020-12-16 LAB — AEROBIC/ANAEROBIC CULTURE W GRAM STAIN (SURGICAL/DEEP WOUND)

## 2020-12-16 MED ORDER — TAMSULOSIN HCL 0.4 MG PO CAPS
0.4000 mg | ORAL_CAPSULE | Freq: Every day | ORAL | Status: DC
  Administered 2020-12-16 – 2020-12-17 (×2): 0.4 mg via ORAL
  Filled 2020-12-16 (×2): qty 1

## 2020-12-16 NOTE — Progress Notes (Signed)
Physical Therapy Treatment Patient Details Name: Andre Glass MRN: [id number] DOB: [birth year] Today's Date: 12/16/2020   History of Present Illness 70 year old male with a history of metastatic prostate cancer, chronic bilateral nephrostomies due to obstructive uropathy from underlying cancer, was admitted to the hospital with right-sided abdominal pain.  Initial CT scan performed did not show any specific obstructive pathology in the bowel.  He was initially seen by GI and general surgery they recommended continue bowel regimen and continued observation.  CT also showed bilateral DVTs and he was started on anticoagulation.  The patient was receiving enemas and unfortunately developed perforation of colon with peritonitis.  He was emergently taken to the operating room and underwent laparotomy, partial colectomy and colostomy.  Biopsies from surgery confirmed metastatic prostate cancer.  He has a JP drain in place and there is concern for underlying abscess.  He is currently on IV antibiotics.  Postoperative recovery has been slow due to his multiple medical issues.  He was seen by both oncology and palliative care.  Patient wishes to continue with current treatments with hopes for improvement.    PT Comments    POD # 18  Assisted pt OOB to recliner required Total Assist. General bed mobility comments: Pt required Total Assist + 2 this session to transfre from supine to EOB.  VERY weak.  Unable to sit unsupported.  Only tolerated EOB x 3 min with Mod Assist/support.  Mod c/o dizziness.  BP was 137/87, HR 91 and RA 100% General transfer comment: first attempted sit to stand + 2 side by side assist however pt was unable to off load no more that 10% effort.  Profoundly weak.  Visibly fagile.  Assisted OOB to recliner via "Bear Hug" 1/4 pivot from elevated bed to recliner.  B LE's buckling under his weight.  + 2 side by side assist to scoot to back of recliner. Positioned to comfort multiple pillows.    Perfomred some B LE TE's AROM LAQ's and Hip Flex (marching) B LE strength very limited 1/4.  Maxi Move Pad placed under pt for return to bed via LIFT. RN aware as she was in room.   Recommendations for follow up therapy are one component of a multi-disciplinary discharge planning process, led by the attending physician.  Recommendations may be updated based on patient status, additional functional criteria and insurance authorization.  Follow Up Recommendations  SNF     Equipment Recommendations  None recommended by PT    Recommendations for Other Services       Precautions / Restrictions Precautions Precautions: Fall Precaution Comments: monitor O2,central line, soft spoken, B nephrostomies + JP drains, colostomy,     Mobility  Bed Mobility Overal bed mobility: Needs Assistance Bed Mobility: Supine to Sit     Supine to sit: Total assist;+2 for physical assistance;+2 for safety/equipment     General bed mobility comments: Pt required Total Assist + 2 this session to transfre from supine to EOB.  VERY weak.  Unable to sit unsupported.  Only tolerated EOB x 3 min with Mod Assist/support.  Mod c/o dizziness.  BP was 137/87, HR 91 and RA 100%    Transfers Overall transfer level: Needs assistance Equipment used: None Transfers: Sit to/from [employer] transfer comment: first attempted sit to stand + 2 side by side assist however pt was unable to off load no more that 10% effort.  Profoundly weak.  Visibly fagile.  Assisted OOB to recliner via "Bear Hug" 1/4 pivot from elevated bed to recliner.  B LE's buckling under his weight.  + 2 side by side assist to scoot to back of recliner.  Ambulation/Gait             General Gait Details: pt unable to support self in sitting EOB   Stairs             Wheelchair Mobility    Modified Rankin (Stroke Patients Only)       Balance                                             Cognition Arousal/Alertness: Awake/alert Behavior During Therapy: WFL for tasks assessed/performed Overall Cognitive Status: Within Functional Limits for tasks assessed                                 General Comments: Patient continues to have very low voice. much more interactive.      Exercises      General Comments        Pertinent Vitals/Pain Pain Assessment: Faces Faces Pain Scale: Hurts even more Pain Location: R Upper ABD (Lateral/flank) Pain Descriptors / Indicators: Discomfort;Grimacing;Sore Pain Intervention(s): Monitored during session;Repositioned    Home Living                      Prior Function            PT Goals (current goals can now be found in the care plan section)      Frequency    Min 2X/week      PT Plan Current plan remains appropriate    Co-evaluation              AM-PAC PT "6 Clicks" Mobility   Outcome Measure  Help needed turning from your back to your side while in a flat bed without using bedrails?: Total Help needed moving from lying on your back to sitting on the side of a flat bed without using bedrails?: Total Help needed moving to and from a bed to a chair (including a wheelchair)?: Total Help needed standing up from a chair using your arms (e.g., wheelchair or bedside chair)?: Total Help needed to walk in hospital room?: Total Help needed climbing 3-5 steps with a railing? : Total 6 Click Score: 6    End of Session Equipment Utilized During Treatment: Gait belt Activity Tolerance: Patient limited by fatigue Patient left: in chair;with call bell/phone within reach Nurse Communication: Mobility status;Need for lift equipment PT Visit Diagnosis: Muscle weakness (generalized) (M62.81);Pain Pain - Right/Left: Right Pain - part of body:  (lateral ABD)     Time: 1450-1520 PT Time Calculation (min) (ACUTE ONLY): 30 min  Charges:  $Therapeutic Activity: 23-37 mins                      {Laruen Risser  PTA Acute  Rehabilitation Services Pager      [phone] Office      [phone]

## 2020-12-16 NOTE — Progress Notes (Signed)
Staff PROGRESS NOTE    Andre Glass  PPJ:[id number] DOB: 04/30/1950 DOA: 11/27/2020 PCP: [name], DO   Brief Narrative: 70 year old male with a history of metastatic prostate cancer, chronic bilateral nephrostomies due to obstructive uropathy from underlying cancer, was admitted to the hospital with right-sided abdominal pain.  Initial CT scan performed did not show any specific obstructive pathology in the bowel.  He was initially seen by GI and general surgery they recommended continue bowel regimen and continued observation.  CT also showed bilateral DVTs and he was started on anticoagulation.  The patient was receiving enemas and unfortunately developed perforation of colon with peritonitis.  He was emergently taken to the operating room and underwent laparotomy, partial colectomy and colostomy.  Biopsies from surgery confirmed metastatic prostate cancer.  He has a JP drain in place and there is concern for underlying abscess.  He is currently on IV antibiotics.  Postoperative recovery has been slow due to his multiple medical issues.  He was seen by both oncology and palliative care.  Patient wishes to continue with current treatments with hopes for improvement.  Assessment & Plan:   Principal Problem:   Stercoral perforation of colon s/p Hartmann colectomy/colostomy 11/28/2020 Active Problems:   Metastatic castration-resistant adenocarcinoma of prostate (HCC)   BPH (benign prostatic hyperplasia)   Symptomatic anemia   Fecal impaction (HCC)   Hypokalemia   Hypomagnesemia   DVT of deep femoral vein, left (HCC)   Malnutrition of moderate degree   Fecal peritonitis (HCC)   Pressure injury of skin   #1 fecal impaction with acute on chronic constipation and stercoral ulcer with perforation resulting in peritonitis status post exploratory laparotomy colectomy and end colostomy on 11/28/2020 with by Dr. Windle Guard.  He presented with a 3-day history of fecal impaction in the setting of prostate  cancer and narcotics at home.  He did not respond to stool softeners and laxatives. GI consulted recommended enemas Patient subsequently became hypotensive with some guarding and rigidity of his abdomen with diaphoresis.  Stat KUB showed possible free air.  General surgery was reconsulted who took him to the OR on an emergent basis.  He underwent exploratory laparotomy sigmoid colectomy and end colostomy.  Surgical pathology showed metastatic prostate cancer. Palliative care care has been consulted patient remains full code and all aggressive measures to be taken. Will consult physical therapy.  #2 Acute b/l lower extremity DVT -patient continues to have active bleeding and right nephrostomy tubes with hematuria.  With active bleeding and poor prognosis will DC heparin.    Discussed with Dr. [name].  #3 metastatic prostate cancer oncology has been consulted with no  new recommendations currently.  #4 acute on chronic anemia he received 4 units of packed RBCs during his hospital stay.   #5 bilateral nephrostomy tubes with hematuria resolved once he was off the heparin drip.  #6 moderate malnutrition-his p.o. intake has been poor on dysphagia 3 diet.  He reports improvement in his appetite and eating better.  #7 goals of care patient with extreme poor prognosis due to metastatic prostate cancer and bowel perforation with peritonitis, patient remains a full code after multiple discussions with patient and his brother.  Palliative care following.   Nutrition Problem: Moderate Malnutrition Etiology: chronic illness, cancer and cancer related treatments     Signs/Symptoms: mild fat depletion, mild muscle depletion, moderate muscle depletion    Interventions: Prostat, Premier Protein, MVI  Estimated body mass index is 27.12 kg/m as calculated from the following:  Height as of this encounter: 5\' 8"  (1.727 m).   Weight as of this encounter: 80.9 kg.  DVT prophylaxis: None due to ongoing  bleeding requiring blood transfusion code Status: Full code  family Communication: None at bedside  Disposition Plan:  Status is: Inpatient  Remains inpatient appropriate because:Hemodynamically unstable   Dispo: The patient is from: Home              Anticipated d/c is to: SNF              Patient currently is not medically stable to d/c.   Difficult to place patient Yes       Consultants:  General surgery, GI, urology, oncology, palliative care  Procedures: Exploratory laparotomy sigmoid colectomy and end colostomy as an emergent procedure CT-guided left lower quadrant drain placed on 12/12/2020 for intra-abdominal abscess Antimicrobials:  Anti-infectives (From admission, onward)    Start     Dose/Rate Route Frequency Ordered Stop   11/28/20 2230  piperacillin-tazobactam (ZOSYN) IVPB 3.375 g        3.375 g 12.5 mL/hr over 240 Minutes Intravenous Every 8 hours 11/28/20 2121     11/28/20 2215  piperacillin-tazobactam (ZOSYN) IVPB 3.375 g  Status:  Discontinued       Note to Pharmacy: Given Intraoperative   3.375 g 100 mL/hr over 30 Minutes Intravenous Every 8 hours 11/28/20 2117 11/28/20 2121   11/28/20 1730  piperacillin-tazobactam (ZOSYN) IVPB 3.375 g       Note to Pharmacy: Given Intraoperative   3.375 g 100 mL/hr over 30 Minutes Intravenous  Once 11/28/20 1729 11/28/20 1750   11/28/20 1712  piperacillin-tazobactam (ZOSYN) 3.375 GM/50ML IVPB       Note to Pharmacy: Marchia Meiers   : cabinet override      11/28/20 1712 11/28/20 1735   11/28/20 1545  ceFAZolin (ANCEF) IVPB 2g/100 mL premix        2 g 200 mL/hr over 30 Minutes Intravenous  Once 11/28/20 1538 11/28/20 1630   11/28/20 1539  ceFAZolin (ANCEF) 2-4 GM/100ML-% IVPB       Note to Pharmacy: Minor, Anneita   : cabinet override      11/28/20 1539 11/28/20 1654   11/27/20 1700  cephALEXin (KEFLEX) capsule 500 mg  Status:  Discontinued        500 mg Oral 3 times daily 11/27/20 1640 11/28/20 2117         Subjective: Patient resting in bed he reports he had a uneventful night he feels he is a little bit stronger every day.  However he has not gotten out of bed in the last 90 days.  He would like to start sitting up sleep on his sides instead of being on his back all day.  Reported a stage II pressure ulcer in the sacral region.    CT-guided placement of drainage catheter in the left lower quadrant intra abdominal peristomal abscess on 12/12/2020.  Heparin was stopped 12/12/2020 hematuria has cleared up no further bleeding noted from the abdominal dressings  CT abdomen 12/12/2020 with new abdominal pelvic gas and fluid collection located in the left anterior abdomen demonstrating a continue to with the pelvic fluid collection most consistent with an abscess.  Multiple walled off fluid collections throughout the abdomen and pelvis.  Bilateral nephrostomy tubes in position with pigtails in the renal pelvis and urinary bladder with bilateral double-J ureteral stents remain in position no hydronephrosis  objective: Vitals:   12/15/20 2054 12/16/20 0500 12/16/20 0517 12/16/20  1116  BP: 110/60  131/82 120/67  Pulse: 73  75 85  Resp:    16  Temp: 98.2 F (36.8 C)  98 F (36.7 C)   TempSrc: Oral  Oral   SpO2: 99%  100% 99%  Weight:  80.9 kg    Height:        Intake/Output Summary (Last 24 hours) at 12/16/2020 1354 Last data filed at 12/16/2020 1200 Gross per 24 hour  Intake 1123.14 ml  Output 1241 ml  Net -117.86 ml    Filed Weights   12/14/20 0405 12/15/20 0500 12/16/20 0500  Weight: 82.1 kg 82 kg 80.9 kg    Examination:  General exam: Appears calm and comfortable  Respiratory system: Clear to auscultation. Respiratory effort normal. Cardiovascular system: S1 & S2 heard, RRR. No JVD, murmurs, rubs, gallops or clicks. No pedal edema. Gastrointestinal system: Abdomen is nondistended, soft and nontender. No organomegaly or masses felt. Normal bowel sounds heard.  And covered with  dressing.  Bilateral nephrostomy tubes, colostomy with liquid stool and gas, JP drain in place.  He has multiple tubes and drains on his abdomen.  Right nephrostomy tube and right JP drain with left nephrostomy tube and left JP drain and colostomy bag with midline incisions.  Central nervous system: Alert and oriented. No focal neurological deficits. Extremities: 2+ edema Skin: No rashes, lesions or ulcers Psychiatry: Judgement and insight appear normal. Mood & affect appropriate.     Data Reviewed: I have personally reviewed following labs and imaging studies  CBC: Recent Labs  Lab 12/10/20 0253 12/11/20 0438 12/12/20 0231 12/13/20 0403 12/15/20 0858  WBC 19.6* 20.2* 21.1* 20.6* 19.4*  HGB 8.2* 8.2* 7.9* 8.0* 7.7*  HCT 26.4* 26.2* 25.2* 25.5* 24.8*  MCV 96.7 96.3 96.6 96.2 97.6  PLT 569* 606* 831* 576* 555*    Basic Metabolic Panel: Recent Labs  Lab 12/10/20 0253 12/11/20 0438 12/13/20 0403 12/15/20 0858  NA 140 141 138 141  K 3.8 4.4 3.8 3.6  CL 110 113* 105 108  CO2 22 23 24 25   GLUCOSE 96 96 85 105*  BUN 25* 18 19 20   CREATININE 0.78 0.80 0.84 0.83  CALCIUM 8.5* 8.7* 8.8* 8.9  MG 1.8 2.0  --   --   PHOS 2.9  --   --   --     GFR: Estimated Creatinine Clearance: 80.1 mL/min (by C-G formula based on SCr of 0.83 mg/dL). Liver Function Tests: Recent Labs  Lab 12/10/20 0253 12/13/20 0403 12/15/20 0858  AST  --  27 28  ALT  --  12 14  ALKPHOS  --  196* 155*  BILITOT  --  0.6 0.4  PROT  --  6.9 7.1  ALBUMIN 2.1* 1.9* 1.8*    No results for input(s): LIPASE, AMYLASE in the last 168 hours. No results for input(s): AMMONIA in the last 168 hours. Coagulation Profile: Recent Labs  Lab 12/13/20 0403  INR 1.3*    Cardiac Enzymes: No results for input(s): CKTOTAL, CKMB, CKMBINDEX, TROPONINI in the last 168 hours. BNP (last 3 results) No results for input(s): PROBNP in the last 8760 hours. HbA1C: No results for input(s): HGBA1C in the last 72  hours. CBG: No results for input(s): GLUCAP in the last 168 hours. Lipid Profile: No results for input(s): CHOL, HDL, LDLCALC, TRIG, CHOLHDL, LDLDIRECT in the last 72 hours. Thyroid Function Tests: No results for input(s): TSH, T4TOTAL, FREET4, T3FREE, THYROIDAB in the last 72 hours. Anemia Panel: No results for  input(s): VITAMINB12, FOLATE, FERRITIN, TIBC, IRON, RETICCTPCT in the last 72 hours. Sepsis Labs: No results for input(s): PROCALCITON, LATICACIDVEN in the last 168 hours.  Recent Results (from the past 240 hour(s))  Aerobic/Anaerobic Culture w Gram Stain (surgical/deep wound)     Status: None (Preliminary result)   Collection Time: 12/13/20 11:05 AM   Specimen: Abscess  Result Value Ref Range Status   Specimen Description ABSCESS ABDOMEN  Final   Special Requests NONE  Final   Gram Stain   Final    MODERATE WBC PRESENT,BOTH PMN AND MONONUCLEAR ABUNDANT GRAM POSITIVE COCCI IN CLUSTERS FEW GRAM POSITIVE COCCI IN CHAINS MODERATE GRAM NEGATIVE RODS    Culture   Final    ABUNDANT ESCHERICHIA COLI CULTURE REINCUBATED FOR BETTER GROWTH HOLDING FOR POSSIBLE ANAEROBE Performed at [facility], [place] [address] [place], [place] [address]    Report Status PENDING  Incomplete          Radiology Studies: No results found.      Scheduled Meds:  (feeding supplement) PROSource Plus  30 mL Oral BID BM   acetaminophen (TYLENOL) oral liquid 160 mg/5 mL  650 mg Oral Q6H   vitamin C  1,000 mg Oral Daily   carvedilol  3.125 mg Oral BID WC   chlorhexidine  15 mL Mouth Rinse BID   Chlorhexidine Gluconate Cloth  6 each Topical Daily   furosemide  20 mg Intravenous BID   gabapentin  300 mg Oral BID   mouth rinse  15 mL Mouth Rinse q12n4p   methocarbamol  1,000 mg Oral TID   mirtazapine  7.5 mg Oral QHS   multivitamin with minerals  1 tablet Oral Daily   pantoprazole  40 mg Oral Daily   predniSONE  5 mg Oral Q breakfast   protein supplement shake  11 oz Oral TID    sodium chloride flush  5 mL Intracatheter Q8H   sodium hypochlorite   Irrigation TID   Continuous Infusions:  piperacillin-tazobactam (ZOSYN)  IV 3.375 g (12/16/20 1351)     LOS: 19 days    Time spent: 39 min   Georgette Shell, MD 12/16/2020, 1:54 PM

## 2020-12-16 NOTE — Progress Notes (Signed)
Received call from Raquel Sarna at [facility] requesting Revenue codes on the patient for possible admission to [facility].

## 2020-12-16 NOTE — Progress Notes (Signed)
Progress Note  18 Days Post-Op  Subjective: Patient reports pain control was improved over the weekend. He is tolerating diet and drinking a protein shake at time of my visit. He denies nausea at this time. He has not been out of bed in the last week it sounds like.   Objective: Vital signs in last 24 hours: Temp:  [98 F (36.7 C)-98.6 F (37 C)] 98 F (36.7 C) (09/12 0517) Pulse Rate:  [73-85] 85 (09/12 1116) Resp:  [16-18] 16 (09/12 1116) BP: (104-131)/(60-82) 120/67 (09/12 1116) SpO2:  [99 %-100 %] 99 % (09/12 1116) Weight:  [80.9 kg] 80.9 kg (09/12 0500) Last BM Date: 12/16/20  Intake/Output from previous day: 09/11 0701 - 09/12 0700 In: 1490.8 [P.O.:950; IV Piggyback:540.8] Out: 830 [Urine:775; Drains:55] Intake/Output this shift: Total I/O In: 577.3 [P.O.:525; IV Piggyback:52.3] Out: 411 [Urine:400; Drains:11]  PE: General appearance: alert, cooperative, no distress, and appears frail and tired. Resp: normal effort  GI: Midline wound with improvement in greenish drainage on dressing, musty odor still present.  Drainage in the surgical JP is unchanged brownish colored fluid. Stoma viable and putting out stool. IR drain with purulent looking fluid    Lab Results:  Recent Labs    12/15/20 0858  WBC 19.4*  HGB 7.7*  HCT 24.8*  PLT 555*   BMET Recent Labs    12/15/20 0858  NA 141  K 3.6  CL 108  CO2 25  GLUCOSE 105*  BUN 20  CREATININE 0.83  CALCIUM 8.9   PT/INR No results for input(s): LABPROT, INR in the last 72 hours. CMP     Component Value Date/Time   NA 141 12/15/2020 0858   NA 143 11/25/2020 1554   K 3.6 12/15/2020 0858   CL 108 12/15/2020 0858   CO2 25 12/15/2020 0858   GLUCOSE 105 (H) 12/15/2020 0858   BUN 20 12/15/2020 0858   BUN 23 11/25/2020 1554   CREATININE 0.83 12/15/2020 0858   CREATININE 1.00 11/14/2020 1146   CALCIUM 8.9 12/15/2020 0858   PROT 7.1 12/15/2020 0858   ALBUMIN 1.8 (L) 12/15/2020 0858   AST 28 12/15/2020 0858    AST 35 11/14/2020 1146   ALT 14 12/15/2020 0858   ALT 45 (H) 11/14/2020 1146   ALKPHOS 155 (H) 12/15/2020 0858   BILITOT 0.4 12/15/2020 0858   BILITOT 0.3 11/14/2020 1146   GFRNONAA >60 12/15/2020 0858   GFRNONAA >60 11/14/2020 1146   GFRAA >60 11/29/2019 1306   Lipase     Component Value Date/Time   LIPASE 22 11/27/2020 1035       Studies/Results: No results found.  Anti-infectives: Anti-infectives (From admission, onward)    Start     Dose/Rate Route Frequency Ordered Stop   11/28/20 2230  piperacillin-tazobactam (ZOSYN) IVPB 3.375 g        3.375 g 12.5 mL/hr over 240 Minutes Intravenous Every 8 hours 11/28/20 2121     11/28/20 2215  piperacillin-tazobactam (ZOSYN) IVPB 3.375 g  Status:  Discontinued       Note to Pharmacy: Given Intraoperative   3.375 g 100 mL/hr over 30 Minutes Intravenous Every 8 hours 11/28/20 2117 11/28/20 2121   11/28/20 1730  piperacillin-tazobactam (ZOSYN) IVPB 3.375 g       Note to Pharmacy: Given Intraoperative   3.375 g 100 mL/hr over 30 Minutes Intravenous  Once 11/28/20 1729 11/28/20 1750   11/28/20 1712  piperacillin-tazobactam (ZOSYN) 3.375 GM/50ML IVPB       Note to  Pharmacy: Marchia Meiers   : cabinet override      11/28/20 1712 11/28/20 1735   11/28/20 1545  ceFAZolin (ANCEF) IVPB 2g/100 mL premix        2 g 200 mL/hr over 30 Minutes Intravenous  Once 11/28/20 1538 11/28/20 1630   11/28/20 1539  ceFAZolin (ANCEF) 2-4 GM/100ML-% IVPB       Note to Pharmacy: Minor, Anneita   : cabinet override      11/28/20 1539 11/28/20 1654   11/27/20 1700  cephALEXin (KEFLEX) capsule 500 mg  Status:  Discontinued        500 mg Oral 3 times daily 11/27/20 1640 11/28/20 2117        Assessment/Plan Fecal impaction/Severe acute on chronic constipation/acute perforation after enema POD#17 s/p ex lap, sigmoid colectomy, end colostomy Dr. Kae Heller 8/25 Findings of large perforation with diffuse feculent peritonitis and extensive contamination of  the peritoneal cavity with stool and pus - CT 8/31 shows pelvic collection with surgical drain in good position, drainage is purulent appearing - continue drain and IV abx - CT 9/8 with collection in L abdomen around stoma - s/p IR drainage 9/9, cxs with E. Coli and s/s are pending  - tolerating Dys 3 diet and nutritional supplements - patient reports pain control is improving  - WOC following, stoma looking more viable and functioning  - TID wet-to-dry dressing changes to midline wound, restarted Dakin's x5d for pseudomonal colonization on 9/9 - incentive spirometry, PT/OT - mobilize as able - surgical pathology confirms metastatic prostate cancer with positive LN   FEN: DYS 3 diet, calorie count >> soft diet ID: keflex. Zosyn 8/25>>  VTE: heparin gtt Follow up:  Dr. Kae Heller   Metastatic prostate cancer ABL anemia on anemia of chronic disease - hgb 7.7 yesterday, stable DVT, b/l LE  Severe malnutrition - meeting 29% kcal need and 54% protein need 12/11/20 - prealbumin 9.9 - Nutrition recommended TF last week - patient reports improvement in PO intake and would like to continue to work on this   LOS: 19 days    Norm Parcel, [facility] Surgery 12/16/2020, 11:38 AM Please see Amion for pager number during day hours 7:00am-4:30pm

## 2020-12-17 ENCOUNTER — Other Ambulatory Visit: Payer: Self-pay | Admitting: Family Medicine

## 2020-12-17 MED ORDER — METHOCARBAMOL 500 MG PO TABS
1000.0000 mg | ORAL_TABLET | Freq: Three times a day (TID) | ORAL | Status: DC | PRN
  Administered 2020-12-18: 1000 mg via ORAL
  Filled 2020-12-17: qty 2

## 2020-12-17 NOTE — Progress Notes (Signed)
Nutrition Follow-up  DOCUMENTATION CODES:   Non-severe (moderate) malnutrition in context of chronic illness  INTERVENTION:  - continue Premier Protein TID (brought in by brother). - continue 30 ml Prosource Plus BID.    NUTRITION DIAGNOSIS:   Moderate Malnutrition related to chronic illness, cancer and cancer related treatments as evidenced by mild fat depletion, mild muscle depletion, moderate muscle depletion. -ongoing  GOAL:   Patient will meet greater than or equal to 90% of their needs -improving, unmet  MONITOR:   PO intake, Supplement acceptance, Labs, Weight trends, Skin  ASSESSMENT:   70 year old male with a history of metastatic prostate cancer, and obstructive uropathy requiring bilateral nephrostomy tubes, presented to ED from home with worsening right-sided abdominal pain.  Intakes have been improving over the past week. Recently documented meal intakes:  9/6- 25% of breakfast and 20% of lunch 9/7- 40% of breakfast 9/8- 100% of breakfast 9/11- 25% of breakfast 9/12- 50% of breakfast and 100% lunch   Patient laying in bed with brother and a male friend at bedside. Patient reports that his appetite is picking up each day. He reports drinking Premier Protein TID. Review of orders indicates he has been accepting Prosource Plus 100% of the time offered also. If he is consuming 100% of each of these supplements each time he accepts them, he is consuming 650 kcal and 120 grams protein from oral nutrition supplements.   Patient denies abdominal pain, pressure, or nausea with any PO intakes.  He reports that he is having some difficulty with chewing. Recommended focusing on softer foods so that chewing is not as laborious for him.   Patient and brother asking about kcal goal/day. Discussed this and recommendations/tips for increasing kcal intake.   Weight has been stable for the past 4 days. Non-pitting edema to LLE documented in the edema section of flow sheet.      Labs reviewed. Medications reviewed; 20 mg IV lasix BID, 1 tablet multivitamin with minerals/day, 5 mg deltasone/day.   Diet Order:   Diet Order             DIET SOFT Room service appropriate? Yes; Fluid consistency: Thin  Diet effective now                   EDUCATION NEEDS:   Education needs have been addressed  Skin:  Skin Assessment: Skin Integrity Issues: Skin Integrity Issues:: Stage II Stage II: sacrum (newly documented 9/12) Incisions: abdomen (8/25)  Last BM:  9/13 (250 ml via colostomy)  Height:   Ht Readings from Last 1 Encounters:  11/27/20 5\' 8"  (1.727 m)    Weight:   Wt Readings from Last 1 Encounters:  12/16/20 80.9 kg    Estimated Nutritional Needs:  Kcal:  2200-2400 kcal/d Protein:  110-125 g/d Fluid:  2.2-2.4 L/d     Jarome Matin, MS, RD, LDN, CNSC Inpatient Clinical Dietitian RD pager # available in [place]  After hours/weekend pager # available in [facility]

## 2020-12-17 NOTE — Progress Notes (Signed)
Drain Location: peri umbilical peristomal Size: Fr size: 12 Fr Date of placement: 12/13/20 Currently to: Drain collection device: suction bulb 24 hour output:  Output by Drain (mL) 12/15/20 0701 - 12/15/20 1900 12/15/20 1901 - 12/16/20 0700 12/16/20 0701 - 12/16/20 1900 12/16/20 1901 - 12/17/20 0700 12/17/20 0701 - 12/17/20 1421  Closed System Drain 1 Right RLQ Bulb (JP) 19 Fr. 55  2 0   Closed System Drain 1 Lateral LUQ Bulb (JP) 12 Fr.   15 10 25     Interval imaging/drain manipulation:  none  Current examination: Flushes/aspirates easily.  Insertion site unremarkable. Suture and stat lock in place. Dressed appropriately.  Purulent drainage in bulb  Plan: Continue TID flushes with 5 cc NS. Record output Q shift. Dressing changes QD or PRN if soiled.  Call IR APP or on call IR MD if difficulty flushing or sudden change in drain output.  Repeat imaging/possible drain injection once output < 10 mL/QD (excluding flush material.)  Discharge planning: Please contact IR APP or on call IR MD prior to patient d/c to ensure appropriate follow up plans are in place. Typically patient will follow up with IR clinic 10-14 days post d/c for repeat imaging/possible drain injection. IR scheduler will contact patient with date/time of appointment. Patient will need to flush drain QD with 5 cc NS, record output QD, dressing changes every 2-3 days or earlier if soiled.   IR will continue to follow - please call with questions or concerns.

## 2020-12-17 NOTE — Consult Note (Signed)
[facility] Nurse Consult Note: Reason for Consult:This patient well known to our service for ostomy care is seen today for MASD to buttocks what now presents with partial thickness skin loss. Moisture plus pressure in the presence of debility and decreased nutrition. Braden Scale Score today is 14. Moderate amount of mucus continues from rectum and accumulating in gluteal cleft and on buttocks. This is a normal finding post ostomy creation when the rectum is not closed. Wound type:Moisture plus pressure Pressure Injury POA: No Measurement:3cm x 7.5cm x 0.1cm Wound KXF:GHWE pink, moist in some areas (40%), dry in others (60%). Epithelialization in islands between open areas more in line with moisture.  Drainage (amount, consistency, odor) scant serous Periwound: moist, intact Dressing procedure/placement/frequency: I will provide the patient with a mattress replacement with los air loss feature and encourage side to side repositioning. When this is communicated to patient he weakly responds, "I try."  We will add a nonadherent antimicrobial wound contact layer (xeroform) to the wound care POC of a silicone foam to the affected area in hopes that it will assist with the drying of these tissues and continued reepithelialization.   A pressure redistribution chair cushion is provided in the event the patient is OOB to the chair at all.  [place] nursing team will not follow, but will remain available to this patient, the nursing and medical teams.  Please re-consult if needed. Thanks, Maudie Flakes, MSN, RN, [place], Arther Abbott  Pager# [phone]

## 2020-12-17 NOTE — Progress Notes (Signed)
Occupational Therapy Treatment Patient Details Name: Andre Glass MRN: [id number] DOB: 14-Nov-1950 Today's Date: 12/17/2020   History of present illness 70 year old male with a history of metastatic prostate cancer, chronic bilateral nephrostomies due to obstructive uropathy from underlying cancer, was admitted to the hospital with right-sided abdominal pain.  Initial CT scan performed did not show any specific obstructive pathology in the bowel.  He was initially seen by GI and general surgery they recommended continue bowel regimen and continued observation.  CT also showed bilateral DVTs and he was started on anticoagulation.  The patient was receiving enemas and unfortunately developed perforation of colon with peritonitis.  He was emergently taken to the operating room and underwent laparotomy, partial colectomy and colostomy.  Biopsies from surgery confirmed metastatic prostate cancer.  He has a JP drain in place and there is concern for underlying abscess.  He is currently on IV antibiotics.  Postoperative recovery has been slow due to his multiple medical issues.  He was seen by both oncology and palliative care.  Patient wishes to continue with current treatments with hopes for improvement.   OT comments  Patient was fatigued this afternoon with patient reporting he was not up to attempting sitting EOB. Patient reported having pain in L side posteriorly near new drain. Nurse and MD in room at end of session made aware. Patient was positioned with pillow to assist with offloading pressure from area. Patient was educated on offloading bilateral heels when at rest in bed to reduce risk of pressure build up. Patient verbalized understanding and was able to direct therapist how to reposition pillows later in session. Patient participated in rolling in bed and BLE movement for positioning/repositioning purposes. Patient's discharge plan remains appropriate at this time. OT will continue to follow acutely.      Recommendations for follow up therapy are one component of a multi-disciplinary discharge planning process, led by the attending physician.  Recommendations may be updated based on patient status, additional functional criteria and insurance authorization.    Follow Up Recommendations  SNF    Equipment Recommendations  Other (comment)    Recommendations for Other Services      Precautions / Restrictions Precautions Precautions: Fall Precaution Comments: monitor O2,central line, soft spoken, B nephrostomies + JP drains, colostomy, Restrictions Weight Bearing Restrictions: No       Mobility Bed Mobility Overal bed mobility: Needs Assistance Bed Mobility: Supine to Sit Rolling: Mod assist         General bed mobility comments: patient was fatigued at time of session with mod A to roll to R side to reduce pressure on back.    Transfers                      Balance                                           ADL either performed or assessed with clinical judgement   ADL                                               Vision       Perception     Praxis      Cognition Arousal/Alertness: Awake/alert Behavior During Therapy: [facility]  for tasks assessed/performed Overall Cognitive Status: Within Functional Limits for tasks assessed                                 General Comments: patient has low voice but able to participate in session more        Exercises     Shoulder Instructions       General Comments      Pertinent Vitals/ Pain       Pain Assessment: Faces Faces Pain Scale: Hurts little more Pain Location: left lateral lower flank near new drain site. Pain Descriptors / Indicators: Discomfort;Grimacing;Sore Pain Intervention(s): Repositioned;Monitored during session  Home Living                                          Prior Functioning/Environment               Frequency           Progress Toward Goals  OT Goals(current goals can now be found in the care plan section)  Progress towards OT goals: Progressing toward goals     Plan Discharge plan remains appropriate    Co-evaluation                 AM-PAC OT "6 Clicks" Daily Activity     Outcome Measure   Help from another person eating meals?: A Little Help from another person taking care of personal grooming?: A Lot Help from another person toileting, which includes using toliet, bedpan, or urinal?: Total Help from another person bathing (including washing, rinsing, drying)?: Total Help from another person to put on and taking off regular upper body clothing?: A Lot Help from another person to put on and taking off regular lower body clothing?: Total 6 Click Score: 10    End of Session    OT Visit Diagnosis: Muscle weakness (generalized) (M62.81);Pain Pain - Right/Left: Left Pain - part of body:  (flank near new drain)   Activity Tolerance Patient limited by fatigue   Patient Left in bed;with call bell/phone within reach;with family/visitor present;with bed alarm set   Nurse Communication Other (comment) (patients pain)        Time: 3976-7341 OT Time Calculation (min): 15 min  Charges: OT Treatments $Therapeutic Activity: 8-22 mins  Jackelyn Poling OTR/L, MS Acute Rehabilitation Department Office# [phone] Pager# [place] 12/17/2020, 3:14 PM

## 2020-12-17 NOTE — Progress Notes (Addendum)
Progress Note  19 Days Post-Op  Subjective: No new complaints.  Ate some soup this morning.    Objective: Vital signs in last 24 hours: Temp:  [97.6 F (36.4 C)-99.3 F (37.4 C)] 98.1 F (36.7 C) (09/13 0410) Pulse Rate:  [76-94] 77 (09/13 0804) Resp:  [16-20] 20 (09/13 0410) BP: (116-136)/(62-74) 116/65 (09/13 0804) SpO2:  [98 %-100 %] 100 % (09/13 0410) Last BM Date: 12/16/20  Intake/Output from previous day: 09/12 0701 - 09/13 0700 In: 1202.3 [P.O.:1045; IV Piggyback:152.3] Out: 2102 [Urine:1775; Drains:27; Stool:300] Intake/Output this shift: No intake/output data recorded.  PE: General appearance: frail appearing, but NAD Resp: normal effort  GI: Midline wound with no green drainage.  Still with slough at the base of his wound and some fascial separation noted.  Drainage in the surgical JP is unchanged brownish colored fluid. And smells of pseudomonas. Stoma viable and putting out stool. IR drain with purulent/tan, millky looking fluid    Lab Results:  Recent Labs    12/15/20 0858  WBC 19.4*  HGB 7.7*  HCT 24.8*  PLT 555*   BMET Recent Labs    12/15/20 0858  NA 141  K 3.6  CL 108  CO2 25  GLUCOSE 105*  BUN 20  CREATININE 0.83  CALCIUM 8.9   PT/INR No results for input(s): LABPROT, INR in the last 72 hours. CMP     Component Value Date/Time   NA 141 12/15/2020 0858   NA 143 11/25/2020 1554   K 3.6 12/15/2020 0858   CL 108 12/15/2020 0858   CO2 25 12/15/2020 0858   GLUCOSE 105 (H) 12/15/2020 0858   BUN 20 12/15/2020 0858   BUN 23 11/25/2020 1554   CREATININE 0.83 12/15/2020 0858   CREATININE 1.00 11/14/2020 1146   CALCIUM 8.9 12/15/2020 0858   PROT 7.1 12/15/2020 0858   ALBUMIN 1.8 (L) 12/15/2020 0858   AST 28 12/15/2020 0858   AST 35 11/14/2020 1146   ALT 14 12/15/2020 0858   ALT 45 (H) 11/14/2020 1146   ALKPHOS 155 (H) 12/15/2020 0858   BILITOT 0.4 12/15/2020 0858   BILITOT 0.3 11/14/2020 1146   GFRNONAA >60 12/15/2020 0858    GFRNONAA >60 11/14/2020 1146   GFRAA >60 11/29/2019 1306   Lipase     Component Value Date/Time   LIPASE 22 11/27/2020 1035       Studies/Results: No results found.  Anti-infectives: Anti-infectives (From admission, onward)    Start     Dose/Rate Route Frequency Ordered Stop   11/28/20 2230  piperacillin-tazobactam (ZOSYN) IVPB 3.375 g        3.375 g 12.5 mL/hr over 240 Minutes Intravenous Every 8 hours 11/28/20 2121     11/28/20 2215  piperacillin-tazobactam (ZOSYN) IVPB 3.375 g  Status:  Discontinued       Note to Pharmacy: Given Intraoperative   3.375 g 100 mL/hr over 30 Minutes Intravenous Every 8 hours 11/28/20 2117 11/28/20 2121   11/28/20 1730  piperacillin-tazobactam (ZOSYN) IVPB 3.375 g       Note to Pharmacy: Given Intraoperative   3.375 g 100 mL/hr over 30 Minutes Intravenous  Once 11/28/20 1729 11/28/20 1750   11/28/20 1712  piperacillin-tazobactam (ZOSYN) 3.375 GM/50ML IVPB       Note to Pharmacy: Marchia Meiers   : cabinet override      11/28/20 1712 11/28/20 1735   11/28/20 1545  ceFAZolin (ANCEF) IVPB 2g/100 mL premix        2 g 200 mL/hr  over 30 Minutes Intravenous  Once 11/28/20 1538 11/28/20 1630   11/28/20 1539  ceFAZolin (ANCEF) 2-4 GM/100ML-% IVPB       Note to Pharmacy: Minor, Anneita   : cabinet override      11/28/20 1539 11/28/20 1654   11/27/20 1700  cephALEXin (KEFLEX) capsule 500 mg  Status:  Discontinued        500 mg Oral 3 times daily 11/27/20 1640 11/28/20 2117        Assessment/Plan Fecal impaction/Severe acute on chronic constipation/acute perforation after enema POD#18 s/p ex lap, sigmoid colectomy, end colostomy Dr. Kae Heller 8/25 Findings of large perforation with diffuse feculent peritonitis and extensive contamination of the peritoneal cavity with stool and pus - CT 8/31 shows pelvic collection with surgical drain in good position, drainage is purulent appearing - continue drain and IV abx - CT 9/8 with collection in L abdomen  around stoma - s/p IR drainage 9/9, cxs with E. Coli and s/s are pending  - tolerating Dys 3 diet and nutritional supplements - patient reports pain control is improving  - WOC following, stoma looking more viable and functioning  - TID wet-to-dry dressing changes to midline wound, restarted Dakin's x5d for pseudomonal colonization on 9/9 - incentive spirometry, PT/OT - mobilize as able - surgical pathology confirms metastatic prostate cancer with positive LN -WBC stable around 19-20K.  Discussed zosyn with ID who reviewed chart and agrees that zosyn is sufficient at this time and does not recommend any changes currently. -repeat CT scan over the next several days   FEN: DYS 3 diet, calorie count >> soft diet ID: keflex. Zosyn 8/25>>  VTE: heparin gtt on hold due to some oozing over the weekend Follow up:  Dr. Kae Heller   Metastatic prostate cancer ABL anemia on anemia of chronic disease - hgb 7.7 yesterday, stable DVT, b/l LE  Severe malnutrition - meeting 29% kcal need and 54% protein need 12/11/20 - prealbumin 9.9 - patient reports improvement in PO intake and would like to continue to work on this   LOS: 20 days    Henreitta Cea, [facility] Surgery 12/17/2020, 9:42 AM Please see Amion for pager number during day hours 7:00am-4:30pm

## 2020-12-17 NOTE — TOC Progression Note (Signed)
Transition of Care [facility]) - Progression Note    Patient Details  Name: Andre Glass MRN: [id number] Date of Birth: 1951-02-18  Transition of Care [facility]) CM/SW Contact  Jameshia Hayashida, Juliann Pulse, RN Phone Number: 12/17/2020, 2:42 PM  Clinical Narrative: Patient/ brother Legrand Como in agreement to talk to [facility] reps-[facility]-await choice from brother Legrand Como.      Expected Discharge Plan: Long Term Acute Care (LTAC) Barriers to Discharge: Continued Medical Work up  Expected Discharge Plan and Services Expected Discharge Plan: [place] (LTAC)   Discharge Planning Services: CM Consult   Living arrangements for the past 2 months: Single Family Home                                       Social Determinants of Health (SDOH) Interventions    Readmission Risk Interventions Readmission Risk Prevention Plan 10/02/2020  Transportation Screening Complete  PCP or Specialist Appt within 5-7 Days Complete  Home Care Screening Complete  Medication Review (RN CM) Complete  Some recent data might be hidden

## 2020-12-17 NOTE — Progress Notes (Signed)
Staff PROGRESS NOTE    Andre Glass  HKV:[id number] DOB: October 28, 1950 DOA: 11/27/2020 PCP: [name], DO   Brief Narrative: 70 year old male with a history of metastatic prostate cancer, chronic bilateral nephrostomies due to obstructive uropathy from underlying cancer, was admitted to the hospital with right-sided abdominal pain.  Initial CT scan performed did not show any specific obstructive pathology in the bowel.  He was initially seen by GI and general surgery they recommended continue bowel regimen and continued observation.  CT also showed bilateral DVTs and he was started on anticoagulation.  The patient was receiving enemas and unfortunately developed perforation of colon with peritonitis.  He was emergently taken to the operating room and underwent laparotomy, partial colectomy and colostomy.  Biopsies from surgery confirmed metastatic prostate cancer.  He has a JP drain in place and there is concern for underlying abscess.  He is currently on IV antibiotics.  Postoperative recovery has been slow due to his multiple medical issues.  He was seen by both oncology and palliative care.  Patient wishes to continue with current treatments with hopes for improvement.  Events in the last 1 week-patient had drain placed in the left lower quadrant for pelvic fluid collection His appetite improved and he is eating better He continues to complain of pain and that oxycodone is not working for him We have started looking for an LTAC for him Patient now has a stage II sacral pressure ulcer wound care has seen him on 12/17/2020  Assessment & Plan:   Principal Problem:   Stercoral perforation of colon s/p Hartmann colectomy/colostomy 11/28/2020 Active Problems:   Metastatic castration-resistant adenocarcinoma of prostate (HCC)   BPH (benign prostatic hyperplasia)   Symptomatic anemia   Fecal impaction (HCC)   Hypokalemia   Hypomagnesemia   DVT of deep femoral vein, left (HCC)   Malnutrition of  moderate degree   Fecal peritonitis ([place])   Pressure injury of skin   #1 fecal impaction with acute on chronic constipation and stercoral ulcer with perforation resulting in peritonitis status post exploratory laparotomy colectomy and end colostomy on 11/28/2020 by Dr. Windle Guard.  He presented with a 3-day history of fecal impaction in the setting of prostate cancer and narcotics at home.  He did not respond to stool softeners and laxatives.  KUB showed possible free air.  General surgery was consulted who took him to the OR on an emergent basis.  He underwent exploratory laparotomy sigmoid colectomy and end colostomy.  Surgical pathology showed metastatic prostate cancer. Palliative care care has been consulted patient remains full code and all aggressive measures to be taken. physical therapy has been seeing the patient and was able to sit him up in the recliner yesterday. Patient continues on Zosyn with persistent leukocytosis.  This is probably partly related to the fact he is still on prednisone. Surgery discussed with ID and agreed to continue Zosyn. He has been on Zosyn since 11/28/2020. JP drain had Pseudomonas and E. coli. I stopped his prednisone on 12/17/2020 Dc lasix 9/13 Dc flomax 9/13 has bilateral nephrostomy tubes draining urine Monitor closely   #2 Acute b/l lower extremity DVT -patient had ongoing hematuria.  Stopped heparin drip after discussing with Dr. [name] with active bleeding and poor prognosis.   #3 metastatic prostate cancer oncology has been consulted with no  new recommendations currently.  #4 acute on chronic anemia he received 4 units of packed RBCs during his hospital stay.  Hemoglobin 7.7.  Monitor labs as needed.  #5  bilateral nephrostomy tubes with hematuria resolved once he was off the heparin drip.  #6 moderate malnutrition-his p.o. intake has been poor on dysphagia 3 diet.  He reports improvement in his appetite and eating better.  #7 goals of care patient  with extreme poor prognosis due to metastatic prostate cancer and bowel perforation with peritonitis, patient remains a full code after multiple discussions with patient and his brother.  Palliative care following.  #8 pain control patient reports pain is not controlled with Tylenol or oxycodone requesting more Dilaudid. I will start him on a fentanyl patch of 12.5.   Nutrition Problem: Moderate Malnutrition Etiology: chronic illness, cancer and cancer related treatments     Signs/Symptoms: mild fat depletion, mild muscle depletion, moderate muscle depletion    Interventions: Prostat, Premier Protein, MVI  Estimated body mass index is 27.12 kg/m as calculated from the following:   Height as of this encounter: 5\' 8"  (1.727 m).   Weight as of this encounter: 80.9 kg.  DVT prophylaxis: None due to ongoing bleeding requiring blood transfusion code Status: Full code  family Communication: None at bedside  Disposition Plan:  Status is: Inpatient  Remains inpatient appropriate because:Hemodynamically unstable   Dispo: The patient is from: Home              Anticipated d/c is to: SNF              Patient currently is not medically stable to d/c.   Difficult to place patient Yes    Consultants:  General surgery, GI, urology, oncology, palliative care  Procedures: Exploratory laparotomy sigmoid colectomy and end colostomy as an emergent procedure CT-guided left lower quadrant drain placed on 12/12/2020 for intra-abdominal abscess  Antimicrobials:  Anti-infectives (From admission, onward)    Start     Dose/Rate Route Frequency Ordered Stop   11/28/20 2230  piperacillin-tazobactam (ZOSYN) IVPB 3.375 g        3.375 g 12.5 mL/hr over 240 Minutes Intravenous Every 8 hours 11/28/20 2121     11/28/20 2215  piperacillin-tazobactam (ZOSYN) IVPB 3.375 g  Status:  Discontinued       Note to Pharmacy: Given Intraoperative   3.375 g 100 mL/hr over 30 Minutes Intravenous Every 8 hours  11/28/20 2117 11/28/20 2121   11/28/20 1730  piperacillin-tazobactam (ZOSYN) IVPB 3.375 g       Note to Pharmacy: Given Intraoperative   3.375 g 100 mL/hr over 30 Minutes Intravenous  Once 11/28/20 1729 11/28/20 1750   11/28/20 1712  piperacillin-tazobactam (ZOSYN) 3.375 GM/50ML IVPB       Note to Pharmacy: Marchia Meiers   : cabinet override      11/28/20 1712 11/28/20 1735   11/28/20 1545  ceFAZolin (ANCEF) IVPB 2g/100 mL premix        2 g 200 mL/hr over 30 Minutes Intravenous  Once 11/28/20 1538 11/28/20 1630   11/28/20 1539  ceFAZolin (ANCEF) 2-4 GM/100ML-% IVPB       Note to Pharmacy: Minor, Anneita   : cabinet override      11/28/20 1539 11/28/20 1654   11/27/20 1700  cephALEXin (KEFLEX) capsule 500 mg  Status:  Discontinued        500 mg Oral 3 times daily 11/27/20 1640 11/28/20 2117        Subjective:  He is resting in bed he reports his pain is not controlled with Tylenol or oxycodone.  He is also getting as needed Dilaudid   CT-guided placement of drainage catheter in  the left lower quadrant intra abdominal peristomal abscess on 12/12/2020.  Heparin was stopped 12/12/2020 hematuria has cleared up no further bleeding noted from the abdominal dressings  CT abdomen 12/12/2020 with new abdominal pelvic gas and fluid collection located in the left anterior abdomen demonstrating a continue to with the pelvic fluid collection most consistent with an abscess.  Multiple walled off fluid collections throughout the abdomen and pelvis.  Bilateral nephrostomy tubes in position with pigtails in the renal pelvis and urinary bladder with bilateral double-J ureteral stents remain in position no hydronephrosis  objective: Vitals:   12/17/20 0051 12/17/20 0410 12/17/20 0804 12/17/20 1247  BP: 122/69 136/74 116/65 121/65  Pulse: 94 85 77 77  Resp: 20 20  20   Temp: 99.3 F (37.4 C) 98.1 F (36.7 C)  98 F (36.7 C)  TempSrc: Oral Oral  Oral  SpO2: 98% 100%  99%  Weight:      Height:         Intake/Output Summary (Last 24 hours) at 12/17/2020 1407 Last data filed at 12/17/2020 1305 Gross per 24 hour  Intake 619.98 ml  Output 2160 ml  Net -1540.02 ml    Filed Weights   12/14/20 0405 12/15/20 0500 12/16/20 0500  Weight: 82.1 kg 82 kg 80.9 kg    Examination:  General exam: Appears calm and comfortable  Respiratory system: Clear to auscultation. Respiratory effort normal. Cardiovascular system: S1 & S2 heard, RRR. No JVD, murmurs, rubs, gallops or clicks. No pedal edema. Gastrointestinal system: Abdomen is nondistended, soft and nontender. No organomegaly or masses felt. Normal bowel sounds heard.  And covered with dressing.  Bilateral nephrostomy tubes, colostomy with liquid stool and gas, JP drain in place.  He has multiple tubes and drains on his abdomen.  Right nephrostomy tube and right JP drain with left nephrostomy tube and left JP drain and colostomy bag with midline incisions.  Central nervous system: Alert and oriented. No focal neurological deficits. Extremities: 2+ edema Skin: No rashes, lesions or ulcers Psychiatry: Judgement and insight appear normal. Mood & affect appropriate.     Data Reviewed: I have personally reviewed following labs and imaging studies  CBC: Recent Labs  Lab 12/11/20 0438 12/12/20 0231 12/13/20 0403 12/15/20 0858  WBC 20.2* 21.1* 20.6* 19.4*  HGB 8.2* 7.9* 8.0* 7.7*  HCT 26.2* 25.2* 25.5* 24.8*  MCV 96.3 96.6 96.2 97.6  PLT 606* 831* 576* 555*    Basic Metabolic Panel: Recent Labs  Lab 12/11/20 0438 12/13/20 0403 12/15/20 0858  NA 141 138 141  K 4.4 3.8 3.6  CL 113* 105 108  CO2 23 24 25   GLUCOSE 96 85 105*  BUN 18 19 20   CREATININE 0.80 0.84 0.83  CALCIUM 8.7* 8.8* 8.9  MG 2.0  --   --     GFR: Estimated Creatinine Clearance: 80.1 mL/min (by C-G formula based on SCr of 0.83 mg/dL). Liver Function Tests: Recent Labs  Lab 12/13/20 0403 12/15/20 0858  AST 27 28  ALT 12 14  ALKPHOS 196* 155*  BILITOT 0.6  0.4  PROT 6.9 7.1  ALBUMIN 1.9* 1.8*    No results for input(s): LIPASE, AMYLASE in the last 168 hours. No results for input(s): AMMONIA in the last 168 hours. Coagulation Profile: Recent Labs  Lab 12/13/20 0403  INR 1.3*    Cardiac Enzymes: No results for input(s): CKTOTAL, CKMB, CKMBINDEX, TROPONINI in the last 168 hours. BNP (last 3 results) No results for input(s): PROBNP in the last 8760 hours.  HbA1C: No results for input(s): HGBA1C in the last 72 hours. CBG: No results for input(s): GLUCAP in the last 168 hours. Lipid Profile: No results for input(s): CHOL, HDL, LDLCALC, TRIG, CHOLHDL, LDLDIRECT in the last 72 hours. Thyroid Function Tests: No results for input(s): TSH, T4TOTAL, FREET4, T3FREE, THYROIDAB in the last 72 hours. Anemia Panel: No results for input(s): VITAMINB12, FOLATE, FERRITIN, TIBC, IRON, RETICCTPCT in the last 72 hours. Sepsis Labs: No results for input(s): PROCALCITON, LATICACIDVEN in the last 168 hours.  Recent Results (from the past 240 hour(s))  Aerobic/Anaerobic Culture w Gram Stain (surgical/deep wound)     Status: None   Collection Time: 12/13/20 11:05 AM   Specimen: Abscess  Result Value Ref Range Status   Specimen Description ABSCESS ABDOMEN  Final   Special Requests NONE  Final   Gram Stain   Final    MODERATE WBC PRESENT,BOTH PMN AND MONONUCLEAR ABUNDANT GRAM POSITIVE COCCI IN CLUSTERS FEW GRAM POSITIVE COCCI IN CHAINS MODERATE GRAM NEGATIVE RODS Performed at [facility], [place] [address], [place], [place] [address]    Culture   Final    ABUNDANT ESCHERICHIA COLI MIXED ANAEROBIC FLORA PRESENT.  CALL LAB IF FURTHER IID REQUIRED.    Report Status 12/16/2020 FINAL  Final   Organism ID, Bacteria ESCHERICHIA COLI  Final      Susceptibility   Escherichia coli - MIC*    AMPICILLIN >=32 RESISTANT Resistant     CEFAZOLIN 8 SENSITIVE Sensitive     CEFEPIME <=0.12 SENSITIVE Sensitive     CEFTAZIDIME <=1 SENSITIVE Sensitive      CEFTRIAXONE <=0.25 SENSITIVE Sensitive     CIPROFLOXACIN <=0.25 SENSITIVE Sensitive     GENTAMICIN <=1 SENSITIVE Sensitive     IMIPENEM <=0.25 SENSITIVE Sensitive     TRIMETH/SULFA <=20 SENSITIVE Sensitive     AMPICILLIN/SULBACTAM >=32 RESISTANT Resistant     PIP/TAZO <=4 SENSITIVE Sensitive     * ABUNDANT ESCHERICHIA COLI          Radiology Studies: No results found.      Scheduled Meds:  (feeding supplement) PROSource Plus  30 mL Oral BID BM   acetaminophen (TYLENOL) oral liquid 160 mg/5 mL  650 mg Oral Q6H   vitamin C  1,000 mg Oral Daily   carvedilol  3.125 mg Oral BID WC   chlorhexidine  15 mL Mouth Rinse BID   Chlorhexidine Gluconate Cloth  6 each Topical Daily   furosemide  20 mg Intravenous BID   gabapentin  300 mg Oral BID   mouth rinse  15 mL Mouth Rinse q12n4p   methocarbamol  1,000 mg Oral TID   mirtazapine  7.5 mg Oral QHS   multivitamin with minerals  1 tablet Oral Daily   pantoprazole  40 mg Oral Daily   predniSONE  5 mg Oral Q breakfast   protein supplement shake  11 oz Oral TID   sodium chloride flush  5 mL Intracatheter Q8H   sodium hypochlorite   Irrigation TID   tamsulosin  0.4 mg Oral Daily   Continuous Infusions:  piperacillin-tazobactam (ZOSYN)  IV 3.375 g (12/17/20 0605)     LOS: 20 days    Time spent: 39 min   Georgette Shell, MD 12/17/2020, 2:07 PM

## 2020-12-18 DIAGNOSIS — E440 Moderate protein-calorie malnutrition: Secondary | ICD-10-CM

## 2020-12-18 DIAGNOSIS — D649 Anemia, unspecified: Secondary | ICD-10-CM

## 2020-12-18 DIAGNOSIS — K658 Other peritonitis: Secondary | ICD-10-CM

## 2020-12-18 DIAGNOSIS — I82412 Acute embolism and thrombosis of left femoral vein: Secondary | ICD-10-CM

## 2020-12-18 DIAGNOSIS — K5900 Constipation, unspecified: Secondary | ICD-10-CM

## 2020-12-18 DIAGNOSIS — K631 Perforation of intestine (nontraumatic): Principal | ICD-10-CM

## 2020-12-18 DIAGNOSIS — K5641 Fecal impaction: Secondary | ICD-10-CM

## 2020-12-18 DIAGNOSIS — Z192 Hormone resistant malignancy status: Secondary | ICD-10-CM

## 2020-12-18 DIAGNOSIS — C61 Malignant neoplasm of prostate: Secondary | ICD-10-CM

## 2020-12-18 DIAGNOSIS — K651 Peritoneal abscess: Secondary | ICD-10-CM

## 2020-12-18 LAB — CBC
HCT: 24.4 % — ABNORMAL LOW (ref 39.0–52.0)
Hemoglobin: 7.5 g/dL — ABNORMAL LOW (ref 13.0–17.0)
MCH: 30.4 pg (ref 26.0–34.0)
MCHC: 30.7 g/dL (ref 30.0–36.0)
MCV: 98.8 fL (ref 80.0–100.0)
Platelets: 511 10*3/uL — ABNORMAL HIGH (ref 150–400)
RBC: 2.47 MIL/uL — ABNORMAL LOW (ref 4.22–5.81)
RDW: 18.6 % — ABNORMAL HIGH (ref 11.5–15.5)
WBC: 19.3 10*3/uL — ABNORMAL HIGH (ref 4.0–10.5)
nRBC: 0.2 % (ref 0.0–0.2)

## 2020-12-18 LAB — COMPREHENSIVE METABOLIC PANEL
ALT: 19 U/L (ref 0–44)
AST: 33 U/L (ref 15–41)
Albumin: 1.9 g/dL — ABNORMAL LOW (ref 3.5–5.0)
Alkaline Phosphatase: 146 U/L — ABNORMAL HIGH (ref 38–126)
Anion gap: 10 (ref 5–15)
BUN: 34 mg/dL — ABNORMAL HIGH (ref 8–23)
CO2: 26 mmol/L (ref 22–32)
Calcium: 9.5 mg/dL (ref 8.9–10.3)
Chloride: 108 mmol/L (ref 98–111)
Creatinine, Ser: 0.7 mg/dL (ref 0.61–1.24)
GFR, Estimated: >60 mL/min (ref >60)
Glucose, Bld: 111 mg/dL — ABNORMAL HIGH (ref 70–99)
Potassium: 3.1 mmol/L — ABNORMAL LOW (ref 3.5–5.1)
Sodium: 144 mmol/L (ref 135–145)
Total Bilirubin: 0.5 mg/dL (ref 0.3–1.2)
Total Protein: 7.5 g/dL (ref 6.5–8.1)

## 2020-12-18 MED ORDER — POTASSIUM CHLORIDE CRYS ER 20 MEQ PO TBCR
40.0000 meq | EXTENDED_RELEASE_TABLET | ORAL | Status: AC
Start: 2020-12-18 — End: 2020-12-18
  Administered 2020-12-18 (×2): 40 meq via ORAL
  Filled 2020-12-18 (×2): qty 2

## 2020-12-18 MED ORDER — PREGABALIN 50 MG PO CAPS
50.0000 mg | ORAL_CAPSULE | Freq: Two times a day (BID) | ORAL | Status: DC
  Administered 2020-12-18 – 2020-12-20 (×4): 50 mg via ORAL
  Filled 2020-12-18 (×5): qty 1

## 2020-12-18 MED ORDER — ENSURE ENLIVE PO LIQD
237.0000 mL | Freq: Two times a day (BID) | ORAL | Status: DC
  Administered 2020-12-18 – 2020-12-20 (×2): 237 mL via ORAL

## 2020-12-18 NOTE — TOC Progression Note (Signed)
Transition of Care [facility]) - Progression Note    Patient Details  Name: Andre Glass MRN: [id number] Date of Birth: [birth year]  Transition of Care [facility]) CM/SW Contact  Nathanyel Defenbaugh, Juliann Pulse, RN Phone Number: 12/18/2020, 3:29 PM  Clinical Narrative:Spoke to patient/Brother Legrand Como in rm-they will tour another LTACH in am-await choice.       Expected Discharge Plan: Long Term Acute Care (LTAC) Barriers to Discharge: Continued Medical Work up  Expected Discharge Plan and Services Expected Discharge Plan: [place] (LTAC)   Discharge Planning Services: CM Consult   Living arrangements for the past 2 months: Single Family Home                                       Social Determinants of Health (SDOH) Interventions    Readmission Risk Interventions Readmission Risk Prevention Plan 10/02/2020  Transportation Screening Complete  PCP or Specialist Appt within 5-7 Days Complete  Home Care Screening Complete  Medication Review (RN CM) Complete  Some recent data might be hidden

## 2020-12-18 NOTE — Progress Notes (Signed)
PT Cancellation Note  Patient Details Name: Andre Glass MRN: [id number] DOB: 12-07-1950   Cancelled Treatment:    Reason Eval/Treat Not Completed: Medical issues which prohibited therapy, patient states that he feels bad and  does not want to   have therapy. Will continue as  patient able.   Claretha Cooper 12/18/2020, 3:33 PM Tresa Endo PT Acute Rehabilitation Services Pager [phone] Office [phone]

## 2020-12-18 NOTE — Progress Notes (Signed)
Progress Note  20 Days Post-Op  Subjective: No new complaints.  Says he is drinking some protein shakes, unclear how much or how much food he is actually taking in.  Objective: Vital signs in last 24 hours: Temp:  [97.5 F (36.4 C)-98.6 F (37 C)] 97.5 F (36.4 C) (09/14 0429) Pulse Rate:  [76-89] 81 (09/14 0841) Resp:  [20] 20 (09/13 1247) BP: (121-134)/(65-72) 134/67 (09/14 0841) SpO2:  [99 %] 99 % (09/14 0429) Weight:  [78.5 kg] 78.5 kg (09/14 0300) Last BM Date: 12/18/20  Intake/Output from previous day: 09/13 0701 - 09/14 0700 In: 1260 [P.O.:1095; IV Piggyback:165] Out: 2245 [Urine:1700; Drains:45; Stool:500] Intake/Output this shift: Total I/O In: -  Out: 275 [Urine:75; Stool:200]  PE: General appearance: frail appearing, but NAD Resp: normal effort  GI: Midline wound with no green drainage.  Still with slough at the base of his wound and some fascial separation noted.  Drainage in the surgical JP is unchanged brownish, cloudy colored fluid, 10cc yesterday. And smells of pseudomonas. Stoma viable and putting out stool. IR drain with purulent/tan, millky looking fluid, 35cc yesterday   Lab Results:  Recent Labs    12/18/20 0417  WBC 19.3*  HGB 7.5*  HCT 24.4*  PLT 511*   BMET Recent Labs    12/18/20 0417  NA 144  K 3.1*  CL 108  CO2 26  GLUCOSE 111*  BUN 34*  CREATININE 0.70  CALCIUM 9.5   PT/INR No results for input(s): LABPROT, INR in the last 72 hours. CMP     Component Value Date/Time   NA 144 12/18/2020 0417   NA 143 11/25/2020 1554   K 3.1 (L) 12/18/2020 0417   CL 108 12/18/2020 0417   CO2 26 12/18/2020 0417   GLUCOSE 111 (H) 12/18/2020 0417   BUN 34 (H) 12/18/2020 0417   BUN 23 11/25/2020 1554   CREATININE 0.70 12/18/2020 0417   CREATININE 1.00 11/14/2020 1146   CALCIUM 9.5 12/18/2020 0417   PROT 7.5 12/18/2020 0417   ALBUMIN 1.9 (L) 12/18/2020 0417   AST 33 12/18/2020 0417   AST 35 11/14/2020 1146   ALT 19 12/18/2020 0417    ALT 45 (H) 11/14/2020 1146   ALKPHOS 146 (H) 12/18/2020 0417   BILITOT 0.5 12/18/2020 0417   BILITOT 0.3 11/14/2020 1146   GFRNONAA >60 12/18/2020 0417   GFRNONAA >60 11/14/2020 1146   GFRAA >60 11/29/2019 1306   Lipase     Component Value Date/Time   LIPASE 22 11/27/2020 1035       Studies/Results: No results found.  Anti-infectives: Anti-infectives (From admission, onward)    Start     Dose/Rate Route Frequency Ordered Stop   11/28/20 2230  piperacillin-tazobactam (ZOSYN) IVPB 3.375 g        3.375 g 12.5 mL/hr over 240 Minutes Intravenous Every 8 hours 11/28/20 2121     11/28/20 2215  piperacillin-tazobactam (ZOSYN) IVPB 3.375 g  Status:  Discontinued       Note to Pharmacy: Given Intraoperative   3.375 g 100 mL/hr over 30 Minutes Intravenous Every 8 hours 11/28/20 2117 11/28/20 2121   11/28/20 1730  piperacillin-tazobactam (ZOSYN) IVPB 3.375 g       Note to Pharmacy: Given Intraoperative   3.375 g 100 mL/hr over 30 Minutes Intravenous  Once 11/28/20 1729 11/28/20 1750   11/28/20 1712  piperacillin-tazobactam (ZOSYN) 3.375 GM/50ML IVPB       Note to Pharmacy: Marchia Meiers   : cabinet override  11/28/20 1712 11/28/20 1735   11/28/20 1545  ceFAZolin (ANCEF) IVPB 2g/100 mL premix        2 g 200 mL/hr over 30 Minutes Intravenous  Once 11/28/20 1538 11/28/20 1630   11/28/20 1539  ceFAZolin (ANCEF) 2-4 GM/100ML-% IVPB       Note to Pharmacy: Minor, Anneita   : cabinet override      11/28/20 1539 11/28/20 1654   11/27/20 1700  cephALEXin (KEFLEX) capsule 500 mg  Status:  Discontinued        500 mg Oral 3 times daily 11/27/20 1640 11/28/20 2117        Assessment/Plan Fecal impaction/Severe acute on chronic constipation/acute perforation after enema POD#19 s/p ex lap, sigmoid colectomy, end colostomy Dr. Kae Heller 8/25 Findings of large perforation with diffuse feculent peritonitis and extensive contamination of the peritoneal cavity with stool and pus - CT 8/31  shows pelvic collection with surgical drain in good position, drainage is purulent appearing - continue drain and IV abx - CT 9/8 with collection in L abdomen around stoma - s/p IR drainage 9/9, cxs with E. Coli and s/s are pending  - tolerating Dys 3 diet and nutritional supplements - patient reports pain control is improving  - WOC following, stoma looking more viable and functioning  - TID wet-to-dry dressing changes to midline wound, restarted Dakin's x5d for pseudomonal colonization on 9/9 - incentive spirometry, PT/OT - mobilize as able - surgical pathology confirms metastatic prostate cancer with positive LN -WBC stable around 19-20K.  Discussed zosyn with ID who reviewed chart and agrees that zosyn is sufficient at this time and does not recommend any changes currently. -repeat CT scan over the next several days as IR drain output decreases   FEN: soft diet, ensure, pro-source ID: keflex. Zosyn 8/25>>  VTE: heparin gtt on hold due to some oozing over the weekend Follow up:  Dr. Kae Heller   Metastatic prostate cancer ABL anemia on anemia of chronic disease - hgb 7.7 yesterday, stable DVT, b/l LE  Severe malnutrition - meeting 29% kcal need and 54% protein need 12/11/20 - prealbumin 9.9 - patient reports improvement in PO intake and would like to continue to work on this   LOS: 21 days    Henreitta Cea, [facility] Surgery 12/18/2020, 9:18 AM Please see Amion for pager number during day hours 7:00am-4:30pm

## 2020-12-18 NOTE — Progress Notes (Signed)
PROGRESS NOTE    Andre Glass  BMW:[id number] DOB: June 10, 1950 DOA: 11/27/2020 PCP: [name], DO    Chief Complaint  Patient presents with   Abdominal Pain    Brief Narrative:  70 year old male with a history of metastatic prostate cancer, chronic bilateral nephrostomies due to obstructive uropathy from underlying cancer, was admitted to the hospital with right-sided abdominal pain.  Initial CT scan performed did not show any specific obstructive pathology in the bowel.  He was initially seen by GI and general surgery they recommended continue bowel regimen and continued observation.  CT also showed bilateral DVTs and he was started on anticoagulation.  The patient was receiving enemas and unfortunately developed perforation of colon with peritonitis.  He was emergently taken to the operating room and underwent laparotomy, partial colectomy and colostomy.  Biopsies from surgery confirmed metastatic prostate cancer.  He has a JP drain in place and there is concern for underlying abscess.  He is currently on IV antibiotics.  Postoperative recovery has been slow due to his multiple medical issues.  He was seen by both oncology and palliative care.  Patient wishes to continue with current treatments with hopes for improvement.   Events in the last 1 week-patient had drain placed in the left lower quadrant for pelvic fluid collection His appetite improved and he is eating better He continues to complain of pain and that oxycodone is not working for him We have started looking for an LTAC for him Patient now has a stage II sacral pressure ulcer wound care has seen him on 12/17/2020     Assessment & Plan:   Principal Problem:   Stercoral perforation of colon s/p Hartmann colectomy/colostomy 11/28/2020 Active Problems:   Metastatic castration-resistant adenocarcinoma of prostate (HCC)   BPH (benign prostatic hyperplasia)   Symptomatic anemia   Fecal impaction (HCC)   Hypokalemia    Hypomagnesemia   DVT of deep femoral vein, left (HCC)   Malnutrition of moderate degree   Fecal peritonitis ([place])   Pressure injury of skin  #1 fecal impaction with acute on chronic constipation and cervical ulcer with perforation resulting in peritonitis status post expiratory laparotomy, colectomy and end colostomy 11/28/2020 per Dr. Kae Heller -Findings operatively showed large perforation with diffuse feculent peritonitis and extensive contamination of peritoneal cavity with stool and pus. -Patient presented with 3-day history of fecal impaction in the setting of prostate cancer narcotics chronically at home. -Bowel regimen of stool softeners, laxatives with no significant response. -KUB done was concerning for free air. -Patient seen in consultation by general surgery who took patient to the OR emergently. -Patient underwent expiratory laparotomy, sigmoid colectomy and end colostomy with surgical pathology consistent with metastatic prostate cancer. -CT 8/31 showed pelvic collection with surgical drain in good position drainage was purulent appearing. -CT 9/8 with collection to left abdomen and around stoma status post IR drainage 9/9 with cultures positive for E. coli with sensitivities pending. -Patient seen by [facility] RN who are following stoma. -Continue current dressing changes of wet-to-dry 3 times daily to midline wound, [place] x5 days for pseudomonal colonization on 9/9. -Continue IV Zosyn. -General surgery discussed with ID who are in agreement with IV Zosyn at this time which patient has been on since 11/28/2020. -JP drain with Pseudomonas and E. coli. -Prednisone discontinued as well as Lasix and Flomax as patient with bilateral nephrostomy tubes with good urine drainage. -Per general surgery.  2.  Acute bilateral lower extremity DVT -Patient noted with ongoing hematuria and heparin  discontinued after Dr. Zigmund Emmilynn Marut discussed with Dr. [name] with active bleeding and his poor  prognosis. -Follow-up. -May consider restarting heparin however will follow for now.  3.  Metastatic prostate cancer -Oncology was consulted with no new recommendations at this time.  4. acute on chronic anemia -Status post transfusion 4 units packed red blood cells. -Hemoglobin stable at 7.5. -Follow H&H, transfusion threshold hemoglobin < 7.  5.  Bilateral nephrostomy tubes with hematuria -Resolved after discontinuation of heparin drip.  6.  Moderate malnutrition -Continue current dysphagia 3 diet.  7.  Pain control -Was noted that patient stated pain was not controlled on Tylenol oxycodone and was requesting Dilaudid. -Discontinue Neurontin and placed back on home regimen Lyrica. -Continue oxycodone, Dilaudid, Robaxin as needed.  8.  Pressure injury Pressure Injury 12/16/20 Sacrum Stage 2 -  Partial thickness loss of dermis presenting as a shallow open injury with a red, pink wound bed without slough. (Active)  12/16/20 1050  Location: Sacrum  Location Orientation:   Staging: Stage 2 -  Partial thickness loss of dermis presenting as a shallow open injury with a red, pink wound bed without slough.  Wound Description (Comments):   Present on Admission: No        9.  Goals of care -Patient with extremely poor prognosis due to metastatic prostate cancer, bowel prep with peritonitis, patient remains a full code after multiple discussions with patient and brother.  Palliative care also following.  DVT prophylaxis: None due to concerns for bleeding. Code Status: Full Family Communication: Updated patient, family at bedside. Disposition:   Status is: Inpatient  Remains inpatient appropriate because:Inpatient level of care appropriate due to severity of illness  Dispo: The patient is from: Home              Anticipated d/c is to: LTAC              Patient currently is not medically stable to d/c.   Difficult to place patient No       Consultants:  General surgery:  Dr. Kae Heller 11/27/2020 Gastroenterology: Dr. Collene Mares 11/27/2020 [place] [facility] Palliative care: Dr. Domingo Cocking 11/29/2020 Urology: Dr. [name] 12/01/2020  Oncology: Dr. [name]   Procedures: CT abdomen and pelvis 11/27/2020, 12/04/2020, 12/12/2020 Abdominal films 11/28/2020, 11/29/2020, 12/01/2020, Chest x-ray 12/05/2020 Lower extremity Dopplers 11/20/2020 Ultrasound of aorta, IVC, iliacs 11/20/2020 CT-guided placement of 12 French locking drainage catheter into intra-abdominal peristomal abscess per IR, Dr. Murrell Redden 12/13/2020 Nephrostogram 11/20/2020 ExpLoratory laparotomy, sigmoid colectomy, end colostomy per Dr. Kae Heller 11/28/2020 Infusion 4 units packed red blood cells.  Antimicrobials:  Anti-infectives (From admission, onward)    Start     Dose/Rate Route Frequency Ordered Stop   11/28/20 2230  piperacillin-tazobactam (ZOSYN) IVPB 3.375 g        3.375 g 12.5 mL/hr over 240 Minutes Intravenous Every 8 hours 11/28/20 2121     11/28/20 2215  piperacillin-tazobactam (ZOSYN) IVPB 3.375 g  Status:  Discontinued       Note to Pharmacy: Given Intraoperative   3.375 g 100 mL/hr over 30 Minutes Intravenous Every 8 hours 11/28/20 2117 11/28/20 2121   11/28/20 1730  piperacillin-tazobactam (ZOSYN) IVPB 3.375 g       Note to Pharmacy: Given Intraoperative   3.375 g 100 mL/hr over 30 Minutes Intravenous  Once 11/28/20 1729 11/28/20 1750   11/28/20 1712  piperacillin-tazobactam (ZOSYN) 3.375 GM/50ML IVPB       Note to Pharmacy: Marchia Meiers   : cabinet override  11/28/20 1712 11/28/20 1735   11/28/20 1545  ceFAZolin (ANCEF) IVPB 2g/100 mL premix        2 g 200 mL/hr over 30 Minutes Intravenous  Once 11/28/20 1538 11/28/20 1630   11/28/20 1539  ceFAZolin (ANCEF) 2-4 GM/100ML-% IVPB       Note to Pharmacy: Minor, Anneita   : cabinet override      11/28/20 1539 11/28/20 1654   11/27/20 1700  cephALEXin (KEFLEX) capsule 500 mg  Status:  Discontinued        500 mg Oral 3 times daily 11/27/20 1640 11/28/20 2117          Subjective: Patient laying in bed.  States overall does not feel too well which is unchanged over the past few days.  No chest pain.  No shortness of breath.  No abdominal pain.  Tolerating some oral intake.  Objective: Vitals:   12/18/20 0300 12/18/20 0429 12/18/20 0841 12/18/20 1214  BP:  130/72 134/67 125/66  Pulse:  76 81 88  Resp:    20  Temp:  (!) 97.5 F (36.4 C)  98.4 F (36.9 C)  TempSrc:  Oral  Oral  SpO2:  99%  100%  Weight: 78.5 kg     Height:        Intake/Output Summary (Last 24 hours) at 12/18/2020 1345 Last data filed at 12/18/2020 0836 Gross per 24 hour  Intake 1174.96 ml  Output 1695 ml  Net -520.04 ml   Filed Weights   12/15/20 0500 12/16/20 0500 12/18/20 0300  Weight: 82 kg 80.9 kg 78.5 kg    Examination:  General exam: Appears calm and comfortable  Respiratory system: Clear to auscultation anterior lung fields. Respiratory effort normal. Cardiovascular system: S1 & S2 heard, RRR. No JVD, murmurs, rubs, gallops or clicks. No pedal edema. Gastrointestinal system: Abdomen is nondistended, soft and nontender. No organomegaly or masses felt.  Midline wound with dressing on top.  Surgical JP with brownish bilious cloudy colored drainage.  IR drain with purulent tan.  Normal bowel sounds heard. Central nervous system: Alert and oriented. No focal neurological deficits. Extremities: Symmetric 5 x 5 power. Skin: No rashes, lesions or ulcers Psychiatry: Judgement and insight appear normal. Mood & affect appropriate.     Data Reviewed: I have personally reviewed following labs and imaging studies  CBC: Recent Labs  Lab 12/12/20 0231 12/13/20 0403 12/15/20 0858 12/18/20 0417  WBC 21.1* 20.6* 19.4* 19.3*  HGB 7.9* 8.0* 7.7* 7.5*  HCT 25.2* 25.5* 24.8* 24.4*  MCV 96.6 96.2 97.6 98.8  PLT 831* 576* 555* 511*    Basic Metabolic Panel: Recent Labs  Lab 12/13/20 0403 12/15/20 0858 12/18/20 0417  NA 138 141 144  K 3.8 3.6 3.1*  CL 105 108 108   CO2 24 25 26   GLUCOSE 85 105* 111*  BUN 19 20 34*  CREATININE 0.84 0.83 0.70  CALCIUM 8.8* 8.9 9.5    GFR: Estimated Creatinine Clearance: 83.1 mL/min (by C-G formula based on SCr of 0.7 mg/dL).  Liver Function Tests: Recent Labs  Lab 12/13/20 0403 12/15/20 0858 12/18/20 0417  AST 27 28 33  ALT 12 14 19   ALKPHOS 196* 155* 146*  BILITOT 0.6 0.4 0.5  PROT 6.9 7.1 7.5  ALBUMIN 1.9* 1.8* 1.9*    CBG: No results for input(s): GLUCAP in the last 168 hours.   Recent Results (from the past 240 hour(s))  Aerobic/Anaerobic Culture w Gram Stain (surgical/deep wound)     Status: None  Collection Time: 12/13/20 11:05 AM   Specimen: Abscess  Result Value Ref Range Status   Specimen Description ABSCESS ABDOMEN  Final   Special Requests NONE  Final   Gram Stain   Final    MODERATE WBC PRESENT,BOTH PMN AND MONONUCLEAR ABUNDANT GRAM POSITIVE COCCI IN CLUSTERS FEW GRAM POSITIVE COCCI IN CHAINS MODERATE GRAM NEGATIVE RODS Performed at [facility], [place] [address] [place], [place] [address]    Culture   Final    ABUNDANT ESCHERICHIA COLI MIXED ANAEROBIC FLORA PRESENT.  CALL LAB IF FURTHER IID REQUIRED.    Report Status 12/16/2020 FINAL  Final   Organism ID, Bacteria ESCHERICHIA COLI  Final      Susceptibility   Escherichia coli - MIC*    AMPICILLIN >=32 RESISTANT Resistant     CEFAZOLIN 8 SENSITIVE Sensitive     CEFEPIME <=0.12 SENSITIVE Sensitive     CEFTAZIDIME <=1 SENSITIVE Sensitive     CEFTRIAXONE <=0.25 SENSITIVE Sensitive     CIPROFLOXACIN <=0.25 SENSITIVE Sensitive     GENTAMICIN <=1 SENSITIVE Sensitive     IMIPENEM <=0.25 SENSITIVE Sensitive     TRIMETH/SULFA <=20 SENSITIVE Sensitive     AMPICILLIN/SULBACTAM >=32 RESISTANT Resistant     PIP/TAZO <=4 SENSITIVE Sensitive     * ABUNDANT ESCHERICHIA COLI         Radiology Studies: No results found.      Scheduled Meds:  (feeding supplement) PROSource Plus  30 mL Oral BID BM   acetaminophen  (TYLENOL) oral liquid 160 mg/5 mL  650 mg Oral Q6H   vitamin C  1,000 mg Oral Daily   carvedilol  3.125 mg Oral BID WC   chlorhexidine  15 mL Mouth Rinse BID   Chlorhexidine Gluconate Cloth  6 each Topical Daily   feeding supplement  237 mL Oral BID BM   gabapentin  300 mg Oral BID   mouth rinse  15 mL Mouth Rinse q12n4p   mirtazapine  7.5 mg Oral QHS   multivitamin with minerals  1 tablet Oral Daily   pantoprazole  40 mg Oral Daily   protein supplement shake  11 oz Oral TID   sodium chloride flush  5 mL Intracatheter Q8H   sodium hypochlorite   Irrigation TID   Continuous Infusions:  piperacillin-tazobactam (ZOSYN)  IV 3.375 g (12/18/20 0556)     LOS: 21 days    Time spent: 40 minutes.    Irine Seal, MD [facility]   To contact the attending provider between 7A-7P or the covering provider during after hours 7P-7A, please log into the web site [email or web address] and access using universal [place] password for that web site. If you do not have the password, please call the hospital operator.  12/18/2020, 1:45 PM

## 2020-12-18 NOTE — Plan of Care (Signed)
?  Problem: Clinical Measurements: ?Goal: Ability to maintain clinical measurements within normal limits will improve ?Outcome: Progressing ?Goal: Will remain free from infection ?Outcome: Progressing ?Goal: Diagnostic test results will improve ?Outcome: Progressing ?  ?

## 2020-12-19 LAB — PREPARE RBC (CROSSMATCH)

## 2020-12-19 LAB — COMPREHENSIVE METABOLIC PANEL
ALT: 21 U/L (ref 0–44)
AST: 38 U/L (ref 15–41)
Albumin: 1.8 g/dL — ABNORMAL LOW (ref 3.5–5.0)
Alkaline Phosphatase: 138 U/L — ABNORMAL HIGH (ref 38–126)
Anion gap: 7 (ref 5–15)
BUN: 32 mg/dL — ABNORMAL HIGH (ref 8–23)
CO2: 26 mmol/L (ref 22–32)
Calcium: 9.5 mg/dL (ref 8.9–10.3)
Chloride: 111 mmol/L (ref 98–111)
Creatinine, Ser: 0.73 mg/dL (ref 0.61–1.24)
GFR, Estimated: >60 mL/min (ref >60)
Glucose, Bld: 119 mg/dL — ABNORMAL HIGH (ref 70–99)
Potassium: 3.9 mmol/L (ref 3.5–5.1)
Sodium: 144 mmol/L (ref 135–145)
Total Bilirubin: 0.4 mg/dL (ref 0.3–1.2)
Total Protein: 7.4 g/dL (ref 6.5–8.1)

## 2020-12-19 LAB — CBC WITH DIFFERENTIAL/PLATELET
Abs Immature Granulocytes: 0.83 10*3/uL — ABNORMAL HIGH (ref 0.00–0.07)
Basophils Absolute: 0.1 10*3/uL (ref 0.0–0.1)
Basophils Relative: 0 %
Eosinophils Absolute: 0.5 10*3/uL (ref 0.0–0.5)
Eosinophils Relative: 2 %
HCT: 23 % — ABNORMAL LOW (ref 39.0–52.0)
Hemoglobin: 6.9 g/dL — CL (ref 13.0–17.0)
Immature Granulocytes: 4 %
Lymphocytes Relative: 9 %
Lymphs Abs: 1.7 10*3/uL (ref 0.7–4.0)
MCH: 29.9 pg (ref 26.0–34.0)
MCHC: 30 g/dL (ref 30.0–36.0)
MCV: 99.6 fL (ref 80.0–100.0)
Monocytes Absolute: 1.6 10*3/uL — ABNORMAL HIGH (ref 0.1–1.0)
Monocytes Relative: 8 %
Neutro Abs: 15.6 10*3/uL — ABNORMAL HIGH (ref 1.7–7.7)
Neutrophils Relative %: 77 %
Platelets: 403 10*3/uL — ABNORMAL HIGH (ref 150–400)
RBC: 2.31 MIL/uL — ABNORMAL LOW (ref 4.22–5.81)
RDW: 18.9 % — ABNORMAL HIGH (ref 11.5–15.5)
WBC: 20.3 10*3/uL — ABNORMAL HIGH (ref 4.0–10.5)
nRBC: 0.1 % (ref 0.0–0.2)

## 2020-12-19 LAB — HEMOGLOBIN AND HEMATOCRIT, BLOOD
HCT: 28.5 % — ABNORMAL LOW (ref 39.0–52.0)
Hemoglobin: 9.1 g/dL — ABNORMAL LOW (ref 13.0–17.0)

## 2020-12-19 LAB — MAGNESIUM: Magnesium: 2.1 mg/dL (ref 1.7–2.4)

## 2020-12-19 MED ORDER — ACETAMINOPHEN 325 MG PO TABS: 650.0000 mg | ORAL_TABLET | Freq: Once | ORAL | Status: AC

## 2020-12-19 MED ORDER — DAKINS (1/4 STRENGTH) 0.125 % EX SOLN
Freq: Two times a day (BID) | CUTANEOUS | Status: DC
  Filled 2020-12-19: qty 473

## 2020-12-19 MED ORDER — FUROSEMIDE 10 MG/ML IJ SOLN
20.0000 mg | Freq: Once | INTRAMUSCULAR | Status: AC
  Administered 2020-12-19: 20 mg via INTRAVENOUS
  Filled 2020-12-19: qty 2

## 2020-12-19 MED ORDER — SODIUM CHLORIDE 0.9% IV SOLUTION: Freq: Once | INTRAVENOUS | Status: AC

## 2020-12-19 MED ORDER — DIPHENHYDRAMINE HCL 25 MG PO CAPS
25.0000 mg | ORAL_CAPSULE | Freq: Once | ORAL | Status: AC
  Administered 2020-12-19: 25 mg via ORAL
  Filled 2020-12-19: qty 1

## 2020-12-19 NOTE — Progress Notes (Addendum)
PROGRESS NOTE    Andre Glass  FHL:[id number] DOB: [birth year] DOA: 11/27/2020 PCP: [name], DO    Chief Complaint  Patient presents with   Abdominal Pain    Brief Narrative:  70 year old male with a history of metastatic prostate cancer, chronic bilateral nephrostomies due to obstructive uropathy from underlying cancer, was admitted to the hospital with right-sided abdominal pain.  Initial CT scan performed did not show any specific obstructive pathology in the bowel.  He was initially seen by GI and general surgery they recommended continue bowel regimen and continued observation.  CT also showed bilateral DVTs and he was started on anticoagulation.  The patient was receiving enemas and unfortunately developed perforation of colon with peritonitis.  He was emergently taken to the operating room and underwent laparotomy, partial colectomy and colostomy.  Biopsies from surgery confirmed metastatic prostate cancer.  He has a JP drain in place and there is concern for underlying abscess.  He is currently on IV antibiotics.  Postoperative recovery has been slow due to his multiple medical issues.  He was seen by both oncology and palliative care.  Patient wishes to continue with current treatments with hopes for improvement.   Events in the last 1 week-patient had drain placed in the left lower quadrant for pelvic fluid collection His appetite improved and he is eating better He continues to complain of pain and that oxycodone is not working for him We have started looking for an LTAC for him Patient now has a stage II sacral pressure ulcer wound care has seen him on 12/17/2020     Assessment & Plan:   Principal Problem:   Stercoral perforation of colon s/p Hartmann colectomy/colostomy 11/28/2020 Active Problems:   Metastatic castration-resistant adenocarcinoma of prostate ([place])   Constipation   BPH (benign prostatic hyperplasia)   Symptomatic anemia   Fecal impaction (HCC)    Hypokalemia   Hypomagnesemia   DVT of deep femoral vein, left (HCC)   Malnutrition of moderate degree   Fecal peritonitis ([place])   Pressure injury of skin   Intra-abdominal abscess ([place])  #1 fecal impaction with acute on chronic constipation and cervical ulcer with perforation resulting in peritonitis status post expiratory laparotomy, colectomy and end colostomy 11/28/2020 per Dr. Kae Heller -Findings operatively showed large perforation with diffuse feculent peritonitis and extensive contamination of peritoneal cavity with stool and pus. -Patient presented with 3-day history of fecal impaction in the setting of prostate cancer narcotics chronically at home. -Bowel regimen of stool softeners, laxatives with no significant response. -KUB done was concerning for free air. -Patient seen in consultation by general surgery who took patient to the OR emergently. -Patient underwent expiratory laparotomy, sigmoid colectomy and end colostomy with surgical pathology consistent with metastatic prostate cancer. -CT 8/31 showed pelvic collection with surgical drain in good position drainage was purulent appearing. -CT 9/8 with collection to left abdomen and around stoma status post IR drainage 9/9 with cultures positive for E. coli with sensitivities pending. -Patient seen by [facility] RN who are following stoma. -Continue current dressing changes of wet-to-dry 3 times daily to midline wound, [place] x5 days for pseudomonal colonization on 9/9. -Continue IV Zosyn. -General surgery discussed with ID who are in agreement with IV Zosyn at this time which patient has been on since 11/28/2020. -JP drain with Pseudomonas and E. coli. -Prednisone discontinued as well as Lasix and Flomax as patient with bilateral nephrostomy tubes with good urine drainage. -Per general surgery.  2.  Acute bilateral lower extremity  DVT -Patient noted with ongoing hematuria and heparin discontinued after Dr. Zigmund Tina Gruner discussed with Dr.  [name] with active bleeding and his poor prognosis. -Patient with hemoglobin dropping and as such we will continue to hold heparin for now. -Outpatient follow-up with hematology/oncology.  3.  Metastatic prostate cancer -Oncology was consulted with no new recommendations at this time.  4. acute on chronic anemia -Status post transfusion 4 units packed red blood cells. -Hemoglobin has trickled down and currently at 6.9.   -Patient with no overt bleeding noted.   -Heparin on hold.   -Transfuse 2 units packed red blood cells.   -Follow H&H, transfusion threshold hemoglobin < 7.  5.  Bilateral nephrostomy tubes with hematuria -Resolved after discontinuation of heparin drip.  6.  Moderate malnutrition -Dysphagia 1 diet.   7.  Pain control -Was noted that patient stated pain was not controlled on Tylenol oxycodone and was requesting Dilaudid. -Discontinued Neurontin and placed back on home regimen Lyrica. -Continue oxycodone, Dilaudid, Robaxin as needed.  8.  Pressure injury Pressure Injury 12/16/20 Sacrum Stage 2 -  Partial thickness loss of dermis presenting as a shallow open injury with a red, pink wound bed without slough. (Active)  12/16/20 1050  Location: Sacrum  Location Orientation:   Staging: Stage 2 -  Partial thickness loss of dermis presenting as a shallow open injury with a red, pink wound bed without slough.  Wound Description (Comments):   Present on Admission: No        9.  Goals of care -Patient with extremely poor prognosis due to metastatic prostate cancer, bowel prep with peritonitis, patient remains a full code after multiple discussions with patient and brother.  Palliative care also following.  DVT prophylaxis: None due to concerns for bleeding. Code Status: Full Family Communication: Updated patient, family at bedside. Disposition:   Status is: Inpatient  Remains inpatient appropriate because:Inpatient level of care appropriate due to severity of  illness  Dispo: The patient is from: Home              Anticipated d/c is to: LTAC once cleared by general surgery.              Patient currently is not medically stable to d/c.   Difficult to place patient No       Consultants:  General surgery: Dr. Kae Heller 11/27/2020 Gastroenterology: Dr. Collene Mares 11/27/2020 [place] [facility] Palliative care: Dr. Domingo Cocking 11/29/2020 Urology: Dr. [name] 12/01/2020  Oncology: Dr. [name]   Procedures: CT abdomen and pelvis 11/27/2020, 12/04/2020, 12/12/2020 Abdominal films 11/28/2020, 11/29/2020, 12/01/2020, Chest x-ray 12/05/2020 Lower extremity Dopplers 11/20/2020 Ultrasound of aorta, IVC, iliacs 11/20/2020 CT-guided placement of 12 French locking drainage catheter into intra-abdominal peristomal abscess per IR, Dr. Murrell Redden 12/13/2020 Nephrostogram 11/20/2020 ExpLoratory laparotomy, sigmoid colectomy, end colostomy per Dr. Kae Heller 11/28/2020 Transfusion 4 units packed red blood cells. Transfused 2 units packed red blood cells 12/19/2020  Antimicrobials:  Anti-infectives (From admission, onward)    Start     Dose/Rate Route Frequency Ordered Stop   11/28/20 2230  piperacillin-tazobactam (ZOSYN) IVPB 3.375 g        3.375 g 12.5 mL/hr over 240 Minutes Intravenous Every 8 hours 11/28/20 2121     11/28/20 2215  piperacillin-tazobactam (ZOSYN) IVPB 3.375 g  Status:  Discontinued       Note to Pharmacy: Given Intraoperative   3.375 g 100 mL/hr over 30 Minutes Intravenous Every 8 hours 11/28/20 2117 11/28/20 2121   11/28/20 1730  piperacillin-tazobactam (ZOSYN) IVPB 3.375 g  Note to Pharmacy: Given Intraoperative   3.375 g 100 mL/hr over 30 Minutes Intravenous  Once 11/28/20 1729 11/28/20 1750   11/28/20 1712  piperacillin-tazobactam (ZOSYN) 3.375 GM/50ML IVPB       Note to Pharmacy: Marchia Meiers   : cabinet override      11/28/20 1712 11/28/20 1735   11/28/20 1545  ceFAZolin (ANCEF) IVPB 2g/100 mL premix        2 g 200 mL/hr over 30 Minutes Intravenous  Once 11/28/20  1538 11/28/20 1630   11/28/20 1539  ceFAZolin (ANCEF) 2-4 GM/100ML-% IVPB       Note to Pharmacy: Minor, Anneita   : cabinet override      11/28/20 1539 11/28/20 1654   11/27/20 1700  cephALEXin (KEFLEX) capsule 500 mg  Status:  Discontinued        500 mg Oral 3 times daily 11/27/20 1640 11/28/20 2117         Subjective: Sitting up in bed.  Feels better overall today than he did yesterday.  Tolerating some of his diet.  No chest pain.  No shortness of breath.  No significant abdominal pain.  Family at bedside.  No overt bleeding noted per RN however some pinkish urine.  Objective: Vitals:   12/18/20 1600 12/18/20 1618 12/18/20 2103 12/19/20 0501  BP:  110/74 (!) 124/46 132/72  Pulse:  88 88 79  Resp: 18  19 17   Temp:   99.2 F (37.3 C) 97.9 F (36.6 C)  TempSrc:   Oral Oral  SpO2:   99% 100%  Weight:    79.4 kg  Height:        Intake/Output Summary (Last 24 hours) at 12/19/2020 1149 Last data filed at 12/19/2020 1149 Gross per 24 hour  Intake 866 ml  Output 1495 ml  Net -629 ml    Filed Weights   12/16/20 0500 12/18/20 0300 12/19/20 0501  Weight: 80.9 kg 78.5 kg 79.4 kg    Examination:  General exam: NAD Respiratory system: Lungs clear to auscultation bilaterally.  No wheezes, no crackles, no rhonchi.  Normal respiratory effort.   Cardiovascular system: Regular rate rhythm no murmurs rubs or gallops.  No JVD.  No lower extremity edema.   Gastrointestinal system: Abdomen is soft, nondistended, nontender.  Midline wound with dressing on top.  Surgical JP with brownish bilious cloudy colored drainage.  IR drain with purulent hand.  Positive bowel sounds.  Ostomy bag with brown stool noted.   Central nervous system: Alert and oriented. No focal neurological deficits. Extremities: Symmetric 5 x 5 power. Skin: No rashes, lesions or ulcers Psychiatry: Judgement and insight appear normal. Mood & affect appropriate.     Data Reviewed: I have personally reviewed following  labs and imaging studies  CBC: Recent Labs  Lab 12/13/20 0403 12/15/20 0858 12/18/20 0417 12/19/20 0803  WBC 20.6* 19.4* 19.3* 20.3*  NEUTROABS  --   --   --  15.6*  HGB 8.0* 7.7* 7.5* 6.9*  HCT 25.5* 24.8* 24.4* 23.0*  MCV 96.2 97.6 98.8 99.6  PLT 576* 555* 511* 403*     Basic Metabolic Panel: Recent Labs  Lab 12/13/20 0403 12/15/20 0858 12/18/20 0417 12/19/20 0803  NA 138 141 144 144  K 3.8 3.6 3.1* 3.9  CL 105 108 108 111  CO2 24 25 26 26   GLUCOSE 85 105* 111* 119*  BUN 19 20 34* 32*  CREATININE 0.84 0.83 0.70 0.73  CALCIUM 8.8* 8.9 9.5 9.5  MG  --   --   --  2.1     GFR: Estimated Creatinine Clearance: 83.1 mL/min (by C-G formula based on SCr of 0.73 mg/dL).  Liver Function Tests: Recent Labs  Lab 12/13/20 0403 12/15/20 0858 12/18/20 0417 12/19/20 0803  AST 27 28 33 38  ALT 12 14 19 21   ALKPHOS 196* 155* 146* 138*  BILITOT 0.6 0.4 0.5 0.4  PROT 6.9 7.1 7.5 7.4  ALBUMIN 1.9* 1.8* 1.9* 1.8*     CBG: No results for input(s): GLUCAP in the last 168 hours.   Recent Results (from the past 240 hour(s))  Aerobic/Anaerobic Culture w Gram Stain (surgical/deep wound)     Status: None   Collection Time: 12/13/20 11:05 AM   Specimen: Abscess  Result Value Ref Range Status   Specimen Description ABSCESS ABDOMEN  Final   Special Requests NONE  Final   Gram Stain   Final    MODERATE WBC PRESENT,BOTH PMN AND MONONUCLEAR ABUNDANT GRAM POSITIVE COCCI IN CLUSTERS FEW GRAM POSITIVE COCCI IN CHAINS MODERATE GRAM NEGATIVE RODS Performed at [facility], [place] [address], [place], [place] [address]    Culture   Final    ABUNDANT ESCHERICHIA COLI MIXED ANAEROBIC FLORA PRESENT.  CALL LAB IF FURTHER IID REQUIRED.    Report Status 12/16/2020 FINAL  Final   Organism ID, Bacteria ESCHERICHIA COLI  Final      Susceptibility   Escherichia coli - MIC*    AMPICILLIN >=32 RESISTANT Resistant     CEFAZOLIN 8 SENSITIVE Sensitive     CEFEPIME <=0.12 SENSITIVE  Sensitive     CEFTAZIDIME <=1 SENSITIVE Sensitive     CEFTRIAXONE <=0.25 SENSITIVE Sensitive     CIPROFLOXACIN <=0.25 SENSITIVE Sensitive     GENTAMICIN <=1 SENSITIVE Sensitive     IMIPENEM <=0.25 SENSITIVE Sensitive     TRIMETH/SULFA <=20 SENSITIVE Sensitive     AMPICILLIN/SULBACTAM >=32 RESISTANT Resistant     PIP/TAZO <=4 SENSITIVE Sensitive     * ABUNDANT ESCHERICHIA COLI          Radiology Studies: No results found.      Scheduled Meds:  (feeding supplement) PROSource Plus  30 mL Oral BID BM   sodium chloride   Intravenous Once   acetaminophen (TYLENOL) oral liquid 160 mg/5 mL  650 mg Oral Q6H   acetaminophen  650 mg Oral Once   vitamin C  1,000 mg Oral Daily   carvedilol  3.125 mg Oral BID WC   chlorhexidine  15 mL Mouth Rinse BID   Chlorhexidine Gluconate Cloth  6 each Topical Daily   diphenhydrAMINE  25 mg Oral Once   feeding supplement  237 mL Oral BID BM   furosemide  20 mg Intravenous Once   mouth rinse  15 mL Mouth Rinse q12n4p   mirtazapine  7.5 mg Oral QHS   multivitamin with minerals  1 tablet Oral Daily   pantoprazole  40 mg Oral Daily   pregabalin  50 mg Oral BID   protein supplement shake  11 oz Oral TID   sodium chloride flush  5 mL Intracatheter Q8H   sodium hypochlorite   Irrigation BID   Continuous Infusions:  piperacillin-tazobactam (ZOSYN)  IV 3.375 g (12/19/20 0608)     LOS: 22 days    Time spent: 40 minutes.    Irine Seal, MD [facility]   To contact the attending provider between 7A-7P or the covering provider during after hours 7P-7A, please log into the web site [email or web address] and access using universal [place] password  for that web site. If you do not have the password, please call the hospital operator.  12/19/2020, 11:49 AM

## 2020-12-19 NOTE — TOC Progression Note (Signed)
Transition of Care [facility]) - Progression Note    Patient Details  Name: Andre Glass MRN: [id number] Date of Birth: 1950/04/11  Transition of Care [facility]) CM/SW Contact  Petra Sargeant, Juliann Pulse, RN Phone Number: 12/19/2020, 2:59 PM  Clinical Narrative:  Received call from [place] of patient-[facility] following for d/c.     Expected Discharge Plan: Long Term Acute Care (LTAC) Barriers to Discharge: Continued Medical Work up  Expected Discharge Plan and Services Expected Discharge Plan: [place] (LTAC)   Discharge Planning Services: CM Consult   Living arrangements for the past 2 months: Single Family Home                                       Social Determinants of Health (SDOH) Interventions    Readmission Risk Interventions Readmission Risk Prevention Plan 10/02/2020  Transportation Screening Complete  PCP or Specialist Appt within 5-7 Days Complete  Home Care Screening Complete  Medication Review (RN CM) Complete  Some recent data might be hidden

## 2020-12-19 NOTE — Progress Notes (Signed)
Drain Location: LLQ Size: Fr size: 12 Fr Date of placement: 12/13/2020  Currently to: Drain collection device: suction bulb 24 hour output:  Output by Drain (mL) 12/17/20 0701 - 12/17/20 1900 12/17/20 1901 - 12/18/20 0700 12/18/20 0701 - 12/18/20 1900 12/18/20 1901 - 12/19/20 0700 12/19/20 0701 - 12/19/20 1142  Closed System Drain 1 Right RLQ Bulb (JP) 19 Fr.  10  0   Closed System Drain 1 Lateral LUQ Bulb (JP) 12 Fr. 25 10 10  50     Interval imaging/drain manipulation:  none  Current examination: Insertion site unremarkable. Suture and stat lock in place. Dressed appropriately.  Continued purulent drainage   Plan: Continue TID flushes with 5 cc NS. Record output Q shift. Dressing changes QD or PRN if soiled.  Call IR APP or on call IR MD if difficulty flushing or sudden change in drain output.  Repeat imaging/possible drain injection once output < 10 mL/QD (excluding flush material.)  Discharge planning: Please contact IR APP or on call IR MD prior to patient d/c to ensure appropriate follow up plans are in place. Typically patient will follow up with IR clinic 10-14 days post d/c for repeat imaging/possible drain injection. IR scheduler will contact patient with date/time of appointment. Patient will need to flush drain QD with 5 cc NS, record output QD, dressing changes every 2-3 days or earlier if soiled.   IR will continue to follow - please call with questions or concerns.  Electronically Signed: Pasty Spillers 12/19/2020, 11:46 AM

## 2020-12-19 NOTE — Progress Notes (Signed)
Progress Note  21 Days Post-Op  Subjective: No new complaints. Didn't work with therapies yesterday bc he felt too weak.  Continues to eat small amounts  Objective: Vital signs in last 24 hours: Temp:  [97.9 F (36.6 C)-99.2 F (37.3 C)] 97.9 F (36.6 C) (09/15 0501) Pulse Rate:  [79-88] 79 (09/15 0501) Resp:  [17-20] 17 (09/15 0501) BP: (110-132)/(46-74) 132/72 (09/15 0501) SpO2:  [99 %-100 %] 100 % (09/15 0501) Weight:  [79.4 kg] 79.4 kg (09/15 0501) Last BM Date: 12/18/20  Intake/Output from previous day: 09/14 0701 - 09/15 0700 In: 870 [P.O.:720; IV Piggyback:150] Out: 2778 [Urine:935; Drains:60; Stool:500] Intake/Output this shift: No intake/output data recorded.  PE: General appearance: frail appearing, but NAD Resp: normal effort  GI: Midline wound with green drainage today.  Still with slough at the base of his wound and some fascial separation noted.  Drainage in the surgical JP is unchanged brownish, cloudy colored fluid, 1cc yesterday. Stoma viable and putting out stool. IR drain with purulent/tan, millky looking fluid, increased to 60cc yesterday   Lab Results:  Recent Labs    12/18/20 0417  WBC 19.3*  HGB 7.5*  HCT 24.4*  PLT 511*   BMET Recent Labs    12/18/20 0417  NA 144  K 3.1*  CL 108  CO2 26  GLUCOSE 111*  BUN 34*  CREATININE 0.70  CALCIUM 9.5   PT/INR No results for input(s): LABPROT, INR in the last 72 hours. CMP     Component Value Date/Time   NA 144 12/18/2020 0417   NA 143 11/25/2020 1554   K 3.1 (L) 12/18/2020 0417   CL 108 12/18/2020 0417   CO2 26 12/18/2020 0417   GLUCOSE 111 (H) 12/18/2020 0417   BUN 34 (H) 12/18/2020 0417   BUN 23 11/25/2020 1554   CREATININE 0.70 12/18/2020 0417   CREATININE 1.00 11/14/2020 1146   CALCIUM 9.5 12/18/2020 0417   PROT 7.5 12/18/2020 0417   ALBUMIN 1.9 (L) 12/18/2020 0417   AST 33 12/18/2020 0417   AST 35 11/14/2020 1146   ALT 19 12/18/2020 0417   ALT 45 (H) 11/14/2020 1146    ALKPHOS 146 (H) 12/18/2020 0417   BILITOT 0.5 12/18/2020 0417   BILITOT 0.3 11/14/2020 1146   GFRNONAA >60 12/18/2020 0417   GFRNONAA >60 11/14/2020 1146   GFRAA >60 11/29/2019 1306   Lipase     Component Value Date/Time   LIPASE 22 11/27/2020 1035       Studies/Results: No results found.  Anti-infectives: Anti-infectives (From admission, onward)    Start     Dose/Rate Route Frequency Ordered Stop   11/28/20 2230  piperacillin-tazobactam (ZOSYN) IVPB 3.375 g        3.375 g 12.5 mL/hr over 240 Minutes Intravenous Every 8 hours 11/28/20 2121     11/28/20 2215  piperacillin-tazobactam (ZOSYN) IVPB 3.375 g  Status:  Discontinued       Note to Pharmacy: Given Intraoperative   3.375 g 100 mL/hr over 30 Minutes Intravenous Every 8 hours 11/28/20 2117 11/28/20 2121   11/28/20 1730  piperacillin-tazobactam (ZOSYN) IVPB 3.375 g       Note to Pharmacy: Given Intraoperative   3.375 g 100 mL/hr over 30 Minutes Intravenous  Once 11/28/20 1729 11/28/20 1750   11/28/20 1712  piperacillin-tazobactam (ZOSYN) 3.375 GM/50ML IVPB       Note to Pharmacy: Marchia Meiers   : cabinet override      11/28/20 1712 11/28/20 1735  11/28/20 1545  ceFAZolin (ANCEF) IVPB 2g/100 mL premix        2 g 200 mL/hr over 30 Minutes Intravenous  Once 11/28/20 1538 11/28/20 1630   11/28/20 1539  ceFAZolin (ANCEF) 2-4 GM/100ML-% IVPB       Note to Pharmacy: Minor, Anneita   : cabinet override      11/28/20 1539 11/28/20 1654   11/27/20 1700  cephALEXin (KEFLEX) capsule 500 mg  Status:  Discontinued        500 mg Oral 3 times daily 11/27/20 1640 11/28/20 2117        Assessment/Plan Fecal impaction/Severe acute on chronic constipation/acute perforation after enema POD#19 s/p ex lap, sigmoid colectomy, end colostomy Dr. Kae Heller 8/25 Findings of large perforation with diffuse feculent peritonitis and extensive contamination of the peritoneal cavity with stool and pus - CT 9/8 with collection in L abdomen  around stoma - s/p IR drainage 9/9, cxs with E. Coli and s/s are pending  - on a soft diet - WOC following - BID wet-to-dry dressing changes to midline wound, restarted Dakin's x5d for pseudomonal colonization on 9/15 after just stopping it on 9/14 - incentive spirometry, PT/OT - mobilize as able - surgical pathology confirms metastatic prostate cancer with positive LN -WBC stable around 19-20K.  Discussed zosyn with ID who reviewed chart and agrees that zosyn is sufficient at this time and does not recommend any changes currently. -IR drain output increase yesterday.  As long as patient stable, will plan to rescan when output decreases.  No need to scan before that time otherwise.   FEN: soft diet, ensure, pro-source ID: [email or web address]. Zosyn 8/25>>  VTE: heparin gtt on hold due to some oozing over the weekend, hgb currently stable yesterday, repeat labs in process Follow up:  Dr. Kae Heller   Metastatic prostate cancer ABL anemia on anemia of chronic disease - hgb 7.7 yesterday, stable DVT, b/l LE  Severe malnutrition - meeting 29% kcal need and 54% protein need 12/11/20 - prealbumin 9.9 - patient reports improvement in PO intake and would like to continue to work on this   LOS: 22 days    Henreitta Cea, [facility] 12/19/2020, 8:50 AM Please see Amion for pager number during day hours 7:00am-4:30pm

## 2020-12-19 NOTE — Plan of Care (Signed)
  Problem: Health Behavior/Discharge Planning: Goal: Ability to manage health-related needs will improve Outcome: Progressing   

## 2020-12-20 ENCOUNTER — Inpatient Hospital Stay
Admission: RE | Admit: 2020-12-20 | Discharge: 2021-01-24 | Disposition: A | Discharge status: Home / Self Care | Payer: Medicare Other | Source: Other Acute Inpatient Hospital | Attending: Internal Medicine | Admitting: Internal Medicine

## 2020-12-20 ENCOUNTER — Other Ambulatory Visit (HOSPITAL_COMMUNITY): Payer: Medicare Other

## 2020-12-20 DIAGNOSIS — J90 Pleural effusion, not elsewhere classified: Secondary | ICD-10-CM

## 2020-12-20 DIAGNOSIS — R509 Fever, unspecified: Secondary | ICD-10-CM

## 2020-12-20 DIAGNOSIS — J189 Pneumonia, unspecified organism: Secondary | ICD-10-CM

## 2020-12-20 DIAGNOSIS — J690 Pneumonitis due to inhalation of food and vomit: Secondary | ICD-10-CM

## 2020-12-20 DIAGNOSIS — K567 Ileus, unspecified: Secondary | ICD-10-CM

## 2020-12-20 DIAGNOSIS — Z9889 Other specified postprocedural states: Secondary | ICD-10-CM

## 2020-12-20 DIAGNOSIS — N186 End stage renal disease: Secondary | ICD-10-CM

## 2020-12-20 LAB — CBC WITH DIFFERENTIAL/PLATELET
Abs Immature Granulocytes: 0.87 10*3/uL — ABNORMAL HIGH (ref 0.00–0.07)
Basophils Absolute: 0.1 10*3/uL (ref 0.0–0.1)
Basophils Relative: 1 %
Eosinophils Absolute: 0.5 10*3/uL (ref 0.0–0.5)
Eosinophils Relative: 3 %
HCT: 29.9 % — ABNORMAL LOW (ref 39.0–52.0)
Hemoglobin: 9.4 g/dL — ABNORMAL LOW (ref 13.0–17.0)
Immature Granulocytes: 4 %
Lymphocytes Relative: 11 %
Lymphs Abs: 2.2 10*3/uL (ref 0.7–4.0)
MCH: 29.7 pg (ref 26.0–34.0)
MCHC: 31.4 g/dL (ref 30.0–36.0)
MCV: 94.6 fL (ref 80.0–100.0)
Monocytes Absolute: 1.6 10*3/uL — ABNORMAL HIGH (ref 0.1–1.0)
Monocytes Relative: 8 %
Neutro Abs: 14.9 10*3/uL — ABNORMAL HIGH (ref 1.7–7.7)
Neutrophils Relative %: 73 %
Platelets: 418 10*3/uL — ABNORMAL HIGH (ref 150–400)
RBC: 3.16 MIL/uL — ABNORMAL LOW (ref 4.22–5.81)
RDW: 18.6 % — ABNORMAL HIGH (ref 11.5–15.5)
WBC: 20.1 10*3/uL — ABNORMAL HIGH (ref 4.0–10.5)
nRBC: 0 % (ref 0.0–0.2)

## 2020-12-20 LAB — TYPE AND SCREEN
ABO/RH(D): A POS
Antibody Screen: NEGATIVE
Unit division: 0
Unit division: 0

## 2020-12-20 LAB — COMPREHENSIVE METABOLIC PANEL
ALT: 57 U/L — ABNORMAL HIGH (ref 0–44)
AST: 97 U/L — ABNORMAL HIGH (ref 15–41)
Albumin: 2 g/dL — ABNORMAL LOW (ref 3.5–5.0)
Alkaline Phosphatase: 199 U/L — ABNORMAL HIGH (ref 38–126)
Anion gap: 7 (ref 5–15)
BUN: 32 mg/dL — ABNORMAL HIGH (ref 8–23)
CO2: 25 mmol/L (ref 22–32)
Calcium: 9 mg/dL (ref 8.9–10.3)
Chloride: 105 mmol/L (ref 98–111)
Creatinine, Ser: 0.88 mg/dL (ref 0.61–1.24)
GFR, Estimated: >60 mL/min (ref >60)
Glucose, Bld: 114 mg/dL — ABNORMAL HIGH (ref 70–99)
Potassium: 3.7 mmol/L (ref 3.5–5.1)
Sodium: 137 mmol/L (ref 135–145)
Total Bilirubin: 0.5 mg/dL (ref 0.3–1.2)
Total Protein: 7.8 g/dL (ref 6.5–8.1)

## 2020-12-20 LAB — BPAM RBC
Blood Product Expiration Date: 202210122359
Blood Product Expiration Date: 202210122359
ISSUE DATE / TIME: 202209151347
ISSUE DATE / TIME: 202209151708
Unit Type and Rh: 6200
Unit Type and Rh: 6200

## 2020-12-20 LAB — URINALYSIS, ROUTINE W REFLEX MICROSCOPIC
Bilirubin Urine: NEGATIVE
Glucose, UA: NEGATIVE mg/dL
Hgb urine dipstick: NEGATIVE
Ketones, ur: NEGATIVE mg/dL
Nitrite: NEGATIVE
Protein, ur: 100 mg/dL — AB
Specific Gravity, Urine: 1.028 (ref 1.005–1.030)
pH: 5 (ref 5.0–8.0)

## 2020-12-20 LAB — HEMOGLOBIN A1C
Hgb A1c MFr Bld: 5 % (ref 4.8–5.6)
Mean Plasma Glucose: 96.8 mg/dL

## 2020-12-20 MED ORDER — PANTOPRAZOLE SODIUM 40 MG PO TBEC: 40.0000 mg | DELAYED_RELEASE_TABLET | Freq: Every day | ORAL | 0 refills | Status: AC

## 2020-12-20 MED ORDER — CARVEDILOL 3.125 MG PO TABS: 3.1250 mg | ORAL_TABLET | Freq: Two times a day (BID) | ORAL | 0 refills | Status: AC

## 2020-12-20 MED ORDER — METHOCARBAMOL 500 MG PO TABS: 1000.0000 mg | ORAL_TABLET | Freq: Three times a day (TID) | ORAL | 0 refills | Status: AC | PRN

## 2020-12-20 MED ORDER — PIPERACILLIN-TAZOBACTAM 3.375 G IVPB: 3.3750 g | Freq: Three times a day (TID) | INTRAVENOUS | 0 refills | Status: AC

## 2020-12-20 NOTE — Progress Notes (Signed)
Pt to discharge today to [facility]. Report called and received Lenore Cordia RN. [facility] to transport and report given at bedside. Pt's assessment is without acute changes at time of transport.

## 2020-12-20 NOTE — TOC Transition Note (Signed)
Transition of Care [facility]) - CM/SW Discharge Note   Patient Details  Name: Andre Glass MRN: [id number] Date of Birth: 27-Feb-1951  [facility]) CM/SW Contact:  Dessa Phi, RN Phone Number: 12/20/2020, 11:34 AM   Clinical Narrative: D/c summary placed for [facility] by [facility]-rep [facility] will mange [facility] transport-de access port or leave in place for ongoing iv therapy. No further CM needs.      Final next level of care: Long Term Acute Care (LTAC) Barriers to Discharge: No Barriers Identified   Patient Goals and CMS Choice Patient states their goals for this hospitalization and ongoing recovery are:: go home CMS Medicare.gov Compare Post Acute Care list provided to:: Patient Choice offered to / list presented to : Patient  Discharge Placement              Patient chooses bed at: Other - please specify in the comment section below: ([facility]) Patient to be transferred to facility by: [place] Name of family member notified: Legrand Como brother [phone] Patient and family notified of of transfer: 12/20/20  Discharge Plan and Services   Discharge Planning Services: CM Consult                                 Social Determinants of Health ([place]) Interventions     Readmission Risk Interventions Readmission Risk Prevention Plan 10/02/2020  Transportation Screening Complete  PCP or Specialist Appt within 5-7 Days Complete  Home Care Screening Complete  Medication Review (RN CM) Complete  Some recent data might be hidden

## 2020-12-20 NOTE — Discharge Instructions (Signed)
MIDLINE WOUND CARE:  use Dakin's solution for 5 days due to pseudomonal colonization, then switch back to normal saline as able - midline dressing to be changed twice daily - supplies: sterile saline, kerlix, scissors, ABD pads, tape  - remove dressing and all packing carefully, moistening with sterile saline as needed to avoid packing/internal dressing sticking to the wound. - clean edges of skin around the wound with water/gauze, making sure there is no tape debris or leakage left on skin that could cause skin irritation or breakdown. - dampen and clean kerlix with sterile saline and pack wound from wound base to skin level, making sure to take note of any possible areas of wound tracking, tunneling and packing appropriately. Wound can be packed loosely. Trim kerlix to size if a whole kerlix is not required. - cover wound with a dry ABD pad and secure with tape.  - write the date/time on the dry dressing/tape to better track when the last dressing change occurred. - apply any skin protectant/powder recommended by clinician to protect skin/skin folds. - change dressing as needed if leakage occurs, wound gets contaminated, or patient requests to shower. - patient may shower daily with wound open and following the shower the wound should be dried and a clean dressing placed.   Likely repeat CT scan when L-sided IR drain output is uner 10cc/24 hrs  [facility], [place] [id number]  OPEN ABDOMINAL SURGERY: POST OP INSTRUCTIONS  Always review your discharge instruction sheet given to you by the facility where your surgery was performed.  IF YOU HAVE DISABILITY OR FAMILY LEAVE FORMS, YOU MUST BRING THEM TO THE OFFICE FOR PROCESSING.  PLEASE DO NOT GIVE THEM TO YOUR DOCTOR.  A prescription for pain medication may be given to you upon discharge.  Take your pain medication as prescribed, if needed.  If narcotic pain medicine is not needed, then you may take acetaminophen (Tylenol) or  ibuprofen (Advil) as needed. Take your usually prescribed medications unless otherwise directed. If you need a refill on your pain medication, please contact your pharmacy. They will contact our office to request authorization.  Prescriptions will not be filled after 5pm or on week-ends. You should follow a light diet the first few days after arrival home, such as soup and crackers, pudding, etc.unless your doctor has advised otherwise. A high-fiber, low fat diet can be resumed as tolerated.   Be sure to include lots of fluids daily. Most patients will experience some swelling and bruising on the chest and neck area.  Ice packs will help.  Swelling and bruising can take several days to resolve Most patients will experience some swelling and bruising in the area of the incision. Ice pack will help. Swelling and bruising can take several days to resolve..  It is common to experience some constipation if taking pain medication after surgery.  Increasing fluid intake and taking a stool softener will usually help or prevent this problem from occurring.  A mild laxative (Milk of Magnesia or Miralax) should be taken according to package directions if there are no bowel movements after 48 hours.  You may have steri-strips (small skin tapes) in place directly over the incision.  These strips should be left on the skin for 7-10 days.  If your surgeon used skin glue on the incision, you may shower in 24 hours.  The glue will flake off over the next 2-3 weeks.  Any sutures or staples will be removed at  the office during your follow-up visit. You may find that a light gauze bandage over your incision may keep your staples from being rubbed or pulled. You may shower and replace the bandage daily. ACTIVITIES:  You may resume regular (light) daily activities beginning the next day--such as daily self-care, walking, climbing stairs--gradually increasing activities as tolerated.  You may have sexual intercourse when it is  comfortable.  Refrain from any heavy lifting or straining until approved by your doctor. You may drive when you no longer are taking prescription pain medication, you can comfortably wear a seatbelt, and you can safely maneuver your car and apply brakes Return to Work: ___________________________________ Dennis Bast should see your doctor in the office for a follow-up appointment approximately two weeks after your surgery.  Make sure that you call for this appointment within a day or two after you arrive home to insure a convenient appointment time. OTHER INSTRUCTIONS:  _____________________________________________________________ _____________________________________________________________  WHEN TO CALL YOUR DOCTOR: Fever over 101.0 Inability to urinate Nausea and/or vomiting Extreme swelling or bruising Continued bleeding from incision. Increased pain, redness, or drainage from the incision. Difficulty swallowing or breathing Muscle cramping or spasms. Numbness or tingling in hands or feet or around lips.  The clinic staff is available to answer your questions during regular business hours.  Please don't hesitate to call and ask to speak to one of the nurses if you have concerns.  For further questions, please visit [email or web address]

## 2020-12-20 NOTE — Discharge Summary (Addendum)
Physician Discharge Summary  Andre Glass LTJ:[id number] DOB: [birth year] DOA: 11/27/2020  PCP: [name], DO  Admit date: 11/27/2020 Discharge date: 12/20/2020  Admitted From: Home  Disposition: [facility]   Recommendations for Outpatient Follow-up:  Follow CBC to monitor Hb.  Follow up recommendation from Oncologist to when to resume Heparin gtt for DVT Continue with wound care.  Might need repeat CT scan to follow up intra-abdominal wound.     Discharge Condition: Guarded CODE STATUS: Full Code Diet recommendation: Dysphagia 1 Diet.   Brief/Interim Summary: 70 year old male with a history of metastatic prostate cancer, chronic bilateral nephrostomies due to obstructive uropathy from underlying cancer, was admitted to the hospital with right-sided abdominal pain.  Initial CT scan performed did not show any specific obstructive pathology in the bowel.  He was initially seen by GI and general surgery they recommended continue bowel regimen and continued observation.  CT also showed bilateral DVTs and he was started on anticoagulation.  The patient was receiving enemas and unfortunately developed perforation of colon with peritonitis.  He was emergently taken to the operating room and underwent laparotomy, partial colectomy and colostomy.  Biopsies from surgery confirmed metastatic prostate cancer.  He has a JP drain in place and there is concern for underlying abscess.  He is currently on IV antibiotics.  Postoperative recovery has been slow due to his multiple medical issues.  He was seen by both oncology and palliative care.  Patient wishes to continue with current treatments with hopes for improvement.   Events in the last 1 week-patient had drain placed in the left lower quadrant for pelvic fluid collection His appetite improved and he is eating better He continues to complain of pain and that oxycodone is not working for him We have started looking for an LTAC for him Patient now has a  stage II sacral pressure ulcer wound care has seen him on 12/17/2020    #1 fecal impaction with acute on chronic constipation and cervical ulcer with perforation resulting in peritonitis status post expiratory laparotomy, colectomy and end colostomy 11/28/2020 per Dr. Kae Heller -Findings operatively showed large perforation with diffuse feculent peritonitis and extensive contamination of peritoneal cavity with stool and pus. -Patient presented with 3-day history of fecal impaction in the setting of prostate cancer narcotics chronically at home. -Bowel regimen of stool softeners, laxatives with no significant response. -KUB done was concerning for free air. -Patient seen in consultation by general surgery who took patient to the OR emergently. -Patient underwent expiratory laparotomy, sigmoid colectomy and end colostomy with surgical pathology consistent with metastatic prostate cancer. -CT 8/31 showed pelvic collection with surgical drain in good position drainage was purulent appearing. -CT 9/8 with collection to left abdomen and around stoma status post IR drainage 9/9 with cultures positive for E. coli with sensitivities pending. -Patient seen by [facility] RN who are following stoma. -Continue current dressing changes of wet-to-dry 3 times daily to midline wound, [place] x5 days for pseudomonal colonization on 9/9. -Continue IV Zosyn. -General surgery discussed with ID who are in agreement with IV Zosyn at this time which patient has been on since 11/28/2020. -JP drain with Pseudomonas and E. coli. -Prednisone discontinued as well as Lasix and Flomax as patient with bilateral nephrostomy tubes with good urine drainage. -Per general surgery. Ok to transfer to [facility]	[place].  -WBC stable, might need repeat CT scan at some point, will defer to surgery.    2.  Acute bilateral lower extremity DVT -Patient noted with ongoing hematuria and  heparin discontinued after Dr. Zigmund Daniel discussed with Dr. [name] with active  bleeding and his poor prognosis. -Patient with hemoglobin dropping and as such we will continue to hold heparin for now. -Outpatient follow-up with hematology/oncology. -follow rec from Dr [name] in regards when to resume heparin.   3.  Metastatic prostate cancer -Oncology was consulted with no new recommendations at this time.  4. Acute on chronic anemia -Status post transfusion 4 units packed red blood cells. -Hemoglobin has trickled down  6.9.  on 9/15. Received 2 units PRBC 9/15. Hb increase to 9, monitor.  -Patient with no overt bleeding noted.   -Heparin on hold.  Follow recommendation from Dr [name] in regards when to resume heparin.  -Monitor Hb.   5.  Bilateral nephrostomy tubes with hematuria -Resolved after discontinuation of heparin drip. -Monitor.  6.  Moderate malnutrition -Dysphagia 1 diet.  -On ensure.  -Clarification patient has moderate malnutrition.  7.  Pain control -Discontinued Neurontin and placed back on home regimen Lyrica. -Continue oxycodone, Dilaudid, Robaxin as needed.  8.  Pressure injury Continue with wound care.  Pressure Injury 12/16/20 Sacrum Stage 2 -  Partial thickness loss of dermis presenting as a shallow open injury with a red, pink wound bed without slough. (Active)  12/16/20 1050  Location: Sacrum  Location Orientation:   Staging: Stage 2 -  Partial thickness loss of dermis presenting as a shallow open injury with a red, pink wound bed without slough.  Wound Description (Comments):   Present on Admission: No           9.  Goals of care -Patient with extremely poor prognosis due to metastatic prostate cancer, bowel prep with peritonitis, patient remains a full code after multiple discussions with patient and brother.  Palliative care also following.    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)   Constipation   BPH (benign prostatic hyperplasia)   Symptomatic anemia   Fecal impaction (HCC)   Hypokalemia   Hypomagnesemia   DVT of deep femoral vein, left (HCC)   Malnutrition of moderate degree   Fecal peritonitis ([place])   Pressure injury of skin   Intra-abdominal abscess [facility])    Discharge Instructions  Discharge Instructions     Diet - low sodium heart healthy   Complete by: As directed    Discharge wound care:   Complete by: As directed    See above   Increase activity slowly   Complete by: As directed       Allergies as of 12/20/2020       Reactions   Ibuprofen Anaphylaxis   Shrimp [shellfish Allergy] Anaphylaxis        Medication List     STOP taking these medications    amLODipine 10 MG tablet Commonly known as: NORVASC   cephALEXin 500 MG capsule Commonly known as: KEFLEX   Geritol Complete Tabs   HYDROmorphone 4 MG tablet Commonly known as: DILAUDID   lidocaine-prilocaine cream Commonly known as: EMLA   linaclotide 290 MCG Caps capsule Commonly known as: LINZESS   naloxegol oxalate 12.5 MG Tabs tablet Commonly known as: MOVANTIK   ondansetron 4 MG tablet Commonly known as: ZOFRAN   polyethylene glycol 17 g packet Commonly known as: MiraLax   predniSONE 5 MG tablet Commonly known as: DELTASONE   Rivaroxaban Stater Pack (15 mg and 20 mg) Commonly known as: Dollene Primrose  PACK   tamsulosin 0.4 MG Caps capsule Commonly known as: FLOMAX   Trulance 3 MG Tabs Generic drug: Plecanatide       TAKE these medications    acetaminophen 325 MG tablet Commonly known as: TYLENOL Take 2 tablets (650 mg total) by mouth every 6 (six) hours as needed for headache.   carvedilol 3.125 MG tablet Commonly known as: COREG Take 1 tablet (3.125 mg total) by mouth 2 (two) times daily with a meal.   feeding supplement Liqd Take 237 mLs by mouth 3 (three) times daily between meals.   methocarbamol 500 MG  tablet Commonly known as: ROBAXIN Take 2 tablets (1,000 mg total) by mouth every 8 (eight) hours as needed for muscle spasms.   mirtazapine 7.5 MG tablet Commonly known as: REMERON TAKE 1 TABLET BY MOUTH AT BEDTIME.   multivitamin with minerals Tabs tablet Take 1 tablet by mouth daily.   pantoprazole 40 MG tablet Commonly known as: PROTONIX Take 1 tablet (40 mg total) by mouth daily. Start taking on: December 21, 2020   piperacillin-tazobactam 3.375 GM/50ML IVPB Commonly known as: ZOSYN Inject 50 mLs (3.375 g total) into the vein every 8 (eight) hours.   pregabalin 50 MG capsule Commonly known as: LYRICA TAKE 1 CAPSULE BY MOUTH 2 TIMES DAILY.   prochlorperazine 10 MG tablet Commonly known as: COMPAZINE Take 1 tablet (10 mg total) by mouth every 6 (six) hours as needed for nausea or vomiting.   vitamin C 500 MG tablet Commonly known as: ASCORBIC ACID Take 1,000 mg by mouth daily.               Discharge Care Instructions  (From admission, onward)           Start     Ordered   12/20/20 0000  Discharge wound care:       Comments: See above   12/20/20 1100            Follow-up Information     Clovis Riley, MD Follow up in 1 month(s).   Specialty: General Surgery Why: call when you get out of LTAC for an appointment with your surgeon Contact information: [address] [place] [address] [phone]                Allergies  Allergen Reactions   Ibuprofen Anaphylaxis   Shrimp [Shellfish Allergy] Anaphylaxis    Consultations: Dr [name] Surgery GI   Procedures/Studies: DG Abd 1 View  Result Date: 12/01/2020 CLINICAL DATA:  NG tube placement EXAM: ABDOMEN - 1 VIEW COMPARISON:  11/29/2020 FINDINGS: Enteric tube terminates in the distal gastric antrum/pyloric region. Bilateral percutaneous nephrostomy catheters. Additional catheter overlying the pelvis. Nonobstructive bowel gas pattern. IMPRESSION: Enteric  tube terminates in the distal gastric antrum/pyloric region. Electronically Signed   By: Julian Hy M.D.   On: 12/01/2020 02:56   DG Abd 1 View  Result Date: 11/29/2020 CLINICAL DATA:  Check gastric catheter placement EXAM: ABDOMEN - 1 VIEW COMPARISON:  Film from earlier in the same day. FINDINGS: Nephroureteral stents are noted bilaterally. Gastric catheter is now noted within the stomach. No obstructive changes are seen. No free air is noted. IMPRESSION: Gastric catheter within the stomach. Electronically Signed   By: Inez Catalina M.D.   On: 11/29/2020 03:54   DG Abd 1 View  Result Date: 11/28/2020 CLINICAL DATA:  Abdominal pain, worsening acutely today. EXAM: ABDOMEN - 1 VIEW COMPARISON:  Comparison made with abdominal radiograph from November 28, 2020. FINDINGS: Lucency above the area of the distended sigmoid colon on the supine portable radiograph. Added density with the configuration of the sigmoid colon in the central abdomen compatible with large amount of stool. Bilateral percutaneous nephroureteral stents are in place. Some increasing distension of small bowel loops in the LEFT abdomen are suggested. On limited assessment there is no acute skeletal process. IMPRESSION: Large amount of stool in the distal colon as described in this patient with known tumor in the pelvis that appears to partially obstruct the colon. Increased lucency over lying the upper margin of stool filled sigmoid colon more likely reflects gas-filled transverse colon. Given findings on prior imaging of colonic obstruction with dilation of the sigmoid would suggest upright and supine abdominal radiographs to exclude the possibility of free air. Slight increase in small bowel distension since previous CT imaging is suggested. This may reflect developing ileus. These results were called by telephone at the time of interpretation on 11/28/2020 at 3:07 pm to provider [facility] , who verbally acknowledged these results.  Electronically Signed   By: Zetta Bills M.D.   On: 11/28/2020 15:00   DG Abd 1 View  Result Date: 11/28/2020 CLINICAL DATA:  Constipation EXAM: ABDOMEN - 1 VIEW COMPARISON:  Portable exam 0505 hours compared to 11/10/2020 FINDINGS: Interval clearance of contrast. Significantly increased stool in colon especially sigmoid colon and rectum. Slight gaseous distention of sigmoid loop. Small bowel gas pattern normal. BILATERAL ureteral stent/nephrostomy tubes unchanged. Bones demineralized. IMPRESSION: Significantly increased stool burden. Electronically Signed   By: Lavonia Dana M.D.   On: 11/28/2020 08:20   CT ABDOMEN PELVIS W CONTRAST  Result Date: 12/12/2020 CLINICAL DATA:  Abdominal abscess/infection suspected. History of stercoral or suppuration and perforation of sigmoid colon status post Hartmann procedure with sigmoid colonic resection and colostomy. History of metastatic prostate cancer with bilateral percutaneous nephrostomy tubes EXAM: CT ABDOMEN AND PELVIS WITH CONTRAST TECHNIQUE: Multidetector CT imaging of the abdomen and pelvis was performed using the standard protocol following bolus administration of intravenous contrast. CONTRAST:  80mL OMNIPAQUE IOHEXOL 350 MG/ML SOLN COMPARISON:  Multiple priors including most recent CT abdomen pelvis December 04, 2020 FINDINGS: Lower chest: Decreased now small right greater than left pleural effusions with adjacent atelectasis versus consolidations. Hepatobiliary: Subcapsular fluid collections further described below. Gallbladder is unremarkable. No biliary ductal dilation. Pancreas: Within normal limits. Spleen: No splenomegaly. Adrenals/Urinary Tract: Bilateral adrenal glands are unremarkable. No hydronephrosis. Bilateral percutaneous nephrostomy tubes remain in position with pigtails in the renal pelves. Bilateral double-J ureteral stents remain in position with pigtails in the renal pelves and urinary bladder. Severely thickened urinary bladder, similar  prior. Stomach/Bowel: Stomach is within normal limits. No pathologic dilation of small or large bowel. Postsurgical changes of sigmoidectomy with Hartmann's pouch formation and left anterior abdominal wall colostomy formation. Vascular/Lymphatic: Aortic atherosclerosis without aneurysmal dilation. Unchanged appearance of the multiple enlarged retroperitoneal, bilateral iliac and pelvic sidewall lymph nodes. Reproductive: Nodular prostatomegaly, similar prior reflecting known prostate neoplasm. Other: Open midline laparotomy incision. Small volume of free abdominopelvic fluid with peritoneal thickening consistent with peritonitis. Multiple walled off fluid collections throughout the abdomen and pelvis seen in the abdomen. Including: -New left anterior abdominal gas and fluid collection measures 10.6 x 5.4 x 15 cm on image 56/2 and 43/5, with this collection extending to the anterior left abdominal wall colostomy site. This fluid collection has a thin connection on image 53/5 with the fluid in the pelvis which was measured on prior study and contains  a portion of the anterior pelvic drainage catheter. -Subdiaphragmatic perisplenic fluid collection which measures approximately 7.5 x 9.3 x 8.3 cm on image 77/5 and 21/2, similar to prior similar prior. -subcapsular hepatic collection measuring 5 x 2.1 cm on image 19/2, additional subcapsular hepatic collection measuring 1.8 cm on image 26/2 and an additional bilobar collections along the inferior aspect of the right hila hepatic lobe measuring approximately 6.5 x 5.6 cm on image 82/5, . -Small fluid collections in the bilateral pericolic gutters measuring 2.4 and 1.7 cm on the right and left respectively on image 60/2, similar to prior. Musculoskeletal: Anasarca. Similar multifocal sclerotic osseous metastases. IMPRESSION: 1. New abdominopelvic gas and fluid collection located in the left anterior abdomen demonstrating continuity with the pelvic fluid collection measured  on prior study, most consistent with an abscess. The right anterior approach drainage catheter does extend through the pelvic portion of the collection however the tip is outside of the collection. 2. Peritonitis with multiple walled off fluid collections throughout the abdomen and pelvis, overall similar to prior. 3. Decreased now small right greater than left pleural effusions with adjacent atelectasis versus consolidations. 4. Open ventral incision with postsurgical changes of sigmoidectomy and Hartmann's pouch formation with left anterior abdominal wall colostomy formation. 5. Bilateral percutaneous nephrostomy tubes remain in position with pigtails in the renal pelves and urinary bladder. Bilateral double-J ureteral stents remain in position. No hydronephrosis. 6. Prostatomegaly with similar metastatic abdominopelvic lymph nodes and osseous metastases. 7.  Aortic Atherosclerosis (ICD10-I70.0). Electronically Signed   By: Dahlia Bailiff M.D.   On: 12/12/2020 17:06   CT ABDOMEN PELVIS W CONTRAST  Result Date: 12/04/2020 CLINICAL DATA:  Intra-abdominal abscess, history of stercoral ulceration and perforation of the sigmoid colon, status post Hartmann procedure sigmoid colon resection and colostomy, metastatic prostate cancer EXAM: CT ABDOMEN AND PELVIS WITH CONTRAST TECHNIQUE: Multidetector CT imaging of the abdomen and pelvis was performed using the standard protocol following bolus administration of intravenous contrast. CONTRAST:  8mL OMNIPAQUE IOHEXOL 350 MG/ML SOLN, additional oral enteric contrast COMPARISON:  11/27/2020 FINDINGS: Lower chest: Moderate, right greater than left pleural effusions associated atelectasis or consolidation. Hepatobiliary: No solid liver abnormality is seen. No gallstones, gallbladder wall thickening, or biliary dilatation. Pancreas: Unremarkable. No pancreatic ductal dilatation or surrounding inflammatory changes. Spleen: Normal in size without significant abnormality.  Adrenals/Urinary Tract: Adrenal glands are unremarkable. Bilateral percutaneous nephrostomy tubes remain in position formed pigtails in the renal pelves. Bilateral double-J ureteral stent catheter remain in position with formed pigtails in the renal pelves and urinary bladder. Severely thickened urinary bladder. No hydronephrosis. Stomach/Bowel: Stomach is within normal limits. Appendix appears normal. No evidence of bowel wall thickening, distention, or inflammatory changes. Vascular/Lymphatic: Scattered aortic atherosclerosis. Multiple enlarged retroperitoneal, bilateral iliac, and pelvic sidewall lymph nodes, unchanged compared to prior examination. Reproductive: Gross prostatomegaly. Other: Status post interval midline laparotomy. Surgical drain about the low abdomen. There is an air and fluid collection about the surgical drain in the left lower quadrant, measuring approximately 5.2 x 3.1 cm (series 2, image 81). Anasarca, worsened compared to prior examination. Small volume ascites, increased compared to prior examination, loculated appearing areas fluid, particularly about the posterior liver (series 2, image 29) and spleen (series 2, image 26). Musculoskeletal: Multiple sclerotic osseous metastases, unchanged. IMPRESSION: 1. Status post interval midline laparotomy with sigmoid colon resection and left lower quadrant end colostomy. 2. Surgical drain about the low abdomen. There is an air and fluid collection about the surgical drain in the left lower quadrant,  measuring approximately 5.2 x 3.1 cm, concerning for abscess. 3. Small volume ascites, increased compared to prior examination, with loculated appearing areas of fluid about the abdomen. 4. Bilateral percutaneous nephrostomy tubes and double-J ureteral stent catheter remain in position with formed pigtails. 5. Prostatomegaly with metastatic lymphadenopathy and osseous metastatic disease, unchanged compared to recent. 6. Anasarca, worsened compared to  prior examination. 7. Moderate, right greater than left pleural effusions, worsened compared to prior examination. Aortic Atherosclerosis (ICD10-I70.0). Electronically Signed   By: Eddie Candle M.D.   On: 12/04/2020 13:28   CT ABDOMEN PELVIS W CONTRAST  Result Date: 11/27/2020 CLINICAL DATA:  Bowel obstruction suspected, history of prostate cancer, ongoing chemotherapy EXAM: CT ABDOMEN AND PELVIS WITH CONTRAST TECHNIQUE: Multidetector CT imaging of the abdomen and pelvis was performed using the standard protocol following bolus administration of intravenous contrast. CONTRAST:  32mL OMNIPAQUE IOHEXOL 350 MG/ML SOLN, additional oral enteric contrast COMPARISON:  CT abdomen pelvis, 11/20/2020 FINDINGS: Lower chest: Trace bilateral pleural effusions and associated atelectasis or consolidation. Hepatobiliary: No solid liver abnormality is seen. No gallstones, gallbladder wall thickening, or biliary dilatation. Pancreas: Unremarkable. No pancreatic ductal dilatation or surrounding inflammatory changes. Spleen: Normal in size without significant abnormality. Adrenals/Urinary Tract: Adrenal glands are unremarkable. Bilateral percutaneous nephrostomy tubes with formed pigtails in the renal pelves. Additional bilateral double-J ureteral stent catheters with formed pigtails in the renal pelves and urinary bladder. The urinary bladder is again severely thickened. Stomach/Bowel: Stomach is within normal limits. Appendix appears normal. There is again noted a large burden of stool throughout the colon and rectum, with very large stool balls in the distended sigmoid colon. The caliber of the colon at this level is 9.9 cm, previously no greater than 6.5 cm (series 4, image 57). Vascular/Lymphatic: Aortic atherosclerosis. Multiple enlarged retroperitoneal, bilateral iliac, and pelvic sidewall lymph nodes are unchanged from recent CT. Reproductive: Gross prostatomegaly. Other: Mild anasarca. Trace ascites. Right eccentric  anterior pelvic mass is unchanged compared to recent prior CT (series 2, image 88). Musculoskeletal: Multiple sclerotic osseous metastases, unchanged. IMPRESSION: 1. There is again noted a large burden of stool throughout the colon and rectum, with very large stool balls in the distended sigmoid colon. The caliber of the colon at this level is 9.9 cm, previously no greater than 6.5 cm. Findings are concerning for fecal impaction, with abnormal transit due to adjacent prostate malignancy. 2. Bilateral percutaneous nephrostomy tubes with formed pigtails in the renal pelves and urinary bladder. Additional bilateral double-J ureteral stent catheters with formed pigtails in the renal pelves and urinary bladder. No hydronephrosis. 3. Findings of advanced metastatic prostate malignancy not significantly changed compared to recent CT. 4. The urinary bladder is again severely thickened, likely related to chronic outlet obstruction, possibly with superimposed infectious or inflammatory cystitis. 5. Trace bilateral pleural effusions and associated atelectasis or consolidation. 6. Trace ascites and anasarca. Aortic Atherosclerosis (ICD10-I70.0). Electronically Signed   By: Eddie Candle M.D.   On: 11/27/2020 13:58   DG CHEST PORT 1 VIEW  Result Date: 12/05/2020 CLINICAL DATA:  70 year old male history of pleural effusion. EXAM: PORTABLE CHEST 1 VIEW COMPARISON:  Chest x-ray 10/27/2020. FINDINGS: Right internal jugular single-lumen power porta cath with tip terminating at the superior cavoatrial junction. Lung volumes are low. There are bibasilar opacities which may reflect areas of atelectasis and/or consolidation, likely with superimposed small bilateral pleural effusions. No pneumothorax. No evidence of pulmonary edema. Heart size is normal. Upper mediastinal contours are within normal limits. IMPRESSION: 1. Low lung volumes with  bibasilar opacities which may reflect areas of atelectasis and/or consolidation, likely with  small bilateral pleural effusions. Electronically Signed   By: Vinnie Langton M.D.   On: 12/05/2020 15:03   CT IMAGE GUIDED FLUID DRAIN BY CATHETER  Result Date: 12/13/2020 INDICATION: Intra-abdominal abscess EXAM: CT-guided drain placement into intra-abdominal abscess MEDICATIONS: The patient is currently admitted to the hospital and receiving intravenous antibiotics. The antibiotics were administered within an appropriate time frame prior to the initiation of the procedure. ANESTHESIA/SEDATION: Fentanyl 2 mcg IV; Versed 50 mg IV Moderate Sedation Time:  14 The patient was continuously monitored during the procedure by the interventional radiology nurse under my direct supervision. COMPLICATIONS: None immediate. PROCEDURE: Informed written consent was obtained from the patient after a thorough discussion of the procedural risks, benefits and alternatives. All questions were addressed. Maximal Sterile Barrier Technique was utilized including caps, mask, sterile gowns, sterile gloves, sterile drape, hand hygiene and skin antiseptic. A timeout was performed prior to the initiation of the procedure. The patient was placed supine on the CT exam table. Limited CT of the abdomen was performed for planning purposes. This again demonstrated a large air fluid collection in the left lower quadrant around the ostomy, consistent with abscess. Skin entry site was marked, the overlying skin was prepped and draped in the standard sterile fashion. Local analgesia was obtained with 1% lidocaine. Using CT guidance, a 12 [place] multipurpose drainage catheter was advanced directly into the fluid collection using trocar technique. There is immediate return of purulent material. Approximate 15-20 mL was aspirated and sent to the lab sample for further analysis. The catheter was locked, and location was confirmed with CT. The catheter was secured to the skin using silk suture and a dressing. It was attached to bulb suction. The  patient tolerated the procedure well without complication. IMPRESSION: Successful CT-guided placement of a 12 French locking drainage catheter into the intra-abdominal peristomal abscess. Electronically Signed   By: Albin Felling M.D.   On: 12/13/2020 12:37   VAS [place] LOWER EXTREMITY VENOUS (DVT) (ONLY [facility] & [facility])  Result Date: 11/20/2020  Lower Venous DVT Study Patient Name:  Elzie SHOURYA MACPHERSON  Date of Exam:   11/20/2020 Medical Rec #: [id number]    Accession #:    [id number] Date of Birth: [birth year]    Patient Gender: M Patient Age:   29 years Exam Location:  [facility] Procedure:      VAS [place] LOWER EXTREMITY VENOUS (DVT) Referring Phys: Gareth Morgan --------------------------------------------------------------------------------  Indications: Abnormal CT of abdomen/pelvis.  Risk Factors: Chemotherapy Cancer Prostate. Performing Technologist: Rogelia Rohrer RVT, RDMS  Examination Guidelines: A complete evaluation includes B-mode imaging, spectral Doppler, color Doppler, and power Doppler as needed of all accessible portions of each vessel. Bilateral testing is considered an integral part of a complete examination. Limited examinations for reoccurring indications may be performed as noted. The reflux portion of the exam is performed with the patient in reverse Trendelenburg.  +---------+---------------+---------+-----------+----------+--------------+ RIGHT    CompressibilityPhasicitySpontaneityPropertiesThrombus Aging +---------+---------------+---------+-----------+----------+--------------+ CFV      Full           Yes      Yes                                 +---------+---------------+---------+-----------+----------+--------------+ SFJ      Full                                                        +---------+---------------+---------+-----------+----------+--------------+  FV Prox  Full           Yes      Yes                                  +---------+---------------+---------+-----------+----------+--------------+ FV Mid   Full           Yes      Yes                                 +---------+---------------+---------+-----------+----------+--------------+ FV DistalFull           Yes      Yes                                 +---------+---------------+---------+-----------+----------+--------------+ PFV      Full                                                        +---------+---------------+---------+-----------+----------+--------------+ POP      Full           Yes      Yes                                 +---------+---------------+---------+-----------+----------+--------------+ PTV      Full                                                        +---------+---------------+---------+-----------+----------+--------------+ PERO     None           No       No                   Acute          +---------+---------------+---------+-----------+----------+--------------+   +---------+---------------+---------+-----------+----------+--------------+ LEFT     CompressibilityPhasicitySpontaneityPropertiesThrombus Aging +---------+---------------+---------+-----------+----------+--------------+ CFV      None           No       No                   Acute          +---------+---------------+---------+-----------+----------+--------------+ SFJ      None                                         Acute          +---------+---------------+---------+-----------+----------+--------------+ FV Prox  None           No       No                   Acute          +---------+---------------+---------+-----------+----------+--------------+ FV Mid   None           No       No  Acute          +---------+---------------+---------+-----------+----------+--------------+ FV DistalNone           No       No                   Acute           +---------+---------------+---------+-----------+----------+--------------+ PFV      None           No       No                   Acute          +---------+---------------+---------+-----------+----------+--------------+ POP      Partial        Yes      Yes                  Acute          +---------+---------------+---------+-----------+----------+--------------+ PTV      None           No       No                   Acute          +---------+---------------+---------+-----------+----------+--------------+ PERO     None           No       No                   Acute          +---------+---------------+---------+-----------+----------+--------------+     Summary: BILATERAL: -No evidence of popliteal cyst, bilaterally. RIGHT: - Findings consistent with acute deep vein thrombosis involving the right peroneal veins.  LEFT: - Findings consistent with acute deep vein thrombosis involving the left common femoral vein, SF junction, left femoral vein, left proximal profunda vein, left popliteal vein, left posterior tibial veins, and left peroneal veins.  *See table(s) above for measurements and observations. Electronically signed by Harold Barban MD on 11/20/2020 at 10:41:44 PM.    Final    VAS US AORTA/IVC/ILIACS  Result Date: 11/20/2020 IVC/ILIAC STUDY Patient Name:  RA PFIESTER  Date of Exam:   11/20/2020 Medical Rec #: [id number]    Accession #:    [id number] Date of Birth: [birth year]    Patient Gender: M Patient Age:   23 years Exam Location:  [facility] Procedure:      VAS US AORTA/IVC/ILIACS Referring Phys: Gareth Morgan --------------------------------------------------------------------------------  Indications: LLE DVT Other Factors: CA patient on chemotherapy. Limitations: Air/bowel gas.  Comparison Study: No previous exams Performing Technologist: Jody Hill RVT, RDMS  Examination Guidelines: A complete evaluation includes B-mode imaging, spectral Doppler, color Doppler, and  power Doppler as needed of all accessible portions of each vessel. Bilateral testing is considered an integral part of a complete examination. Limited examinations for reoccurring indications may be performed as noted.  IVC/Iliac Findings: +----------+------+--------+--------------+    IVC    PatentThrombus   Comments    +----------+------+--------+--------------+ IVC Mid   patent                       +----------+------+--------+--------------+ IVC Distal              Not visualized +----------+------+--------+--------------+  +-------------------+---------+-----------+---------+-----------+--------------+         CIV        RT-PatentRT-ThrombusLT-PatentLT-Thrombus   Comments    +-------------------+---------+-----------+---------+-----------+--------------+ Common Iliac Prox  Not visualized +-------------------+---------+-----------+---------+-----------+--------------+ Common Iliac Mid                                           Not visualized +-------------------+---------+-----------+---------+-----------+--------------+ Common Iliac Distal                                        Not visualized +-------------------+---------+-----------+---------+-----------+--------------+ Not seen due to over lying bowel gas - patient not NPO +-------------------------+---------+-----------+---------+-----------+--------+            EIV           RT-PatentRT-ThrombusLT-PatentLT-ThrombusComments +-------------------------+---------+-----------+---------+-----------+--------+ External Iliac Vein Prox  patent                         acute            +-------------------------+---------+-----------+---------+-----------+--------+ External Iliac Vein Mid   patent                         acute            +-------------------------+---------+-----------+---------+-----------+--------+ External Iliac Vein       patent                          acute            Distal                                                                    +-------------------------+---------+-----------+---------+-----------+--------+   Summary: IVC/Iliac: There is no evidence of thrombus involving the IVC in visualized areas. There is no evidence of thrombus involving the right external iliac vein. There is evidence of acute thrombus involving the left external iliac vein. Visualization of distal Inferior Vena Cava, proximal common Iliac, mid common Iliac and distal common Iliac was limited.  *See table(s) above for measurements and observations.  Electronically signed by Harold Barban MD on 11/20/2020 at 10:41:23 PM.    Final      Subjective: He is alert, frail, complain of abdominal pain.   Discharge Exam: Vitals:   12/20/20 0434 12/20/20 0815  BP: (!) 148/79 (!) 145/72  Pulse: 72 70  Resp: 18   Temp: 97.6 F (36.4 C)   SpO2: 100%      General: Pt is alert, awake, not in acute distress, frail.  Cardiovascular: RRR, S1/S2 +, no rubs, no gallops Respiratory: CTA bilaterally, no wheezing, no rhonchi Abdominal: Soft, colostomy with bag, stool no bleed. BL nephrostomy tube, drain in place.  Extremities: no edema, no cyanosis    The results of significant diagnostics from this hospitalization (including imaging, microbiology, ancillary and laboratory) are listed below for reference.     Microbiology: Recent Results (from the past 240 hour(s))  Aerobic/Anaerobic Culture w Gram Stain (surgical/deep wound)     Status: None   Collection Time: 12/13/20 11:05 AM   Specimen: Abscess  Result Value Ref Range Status   Specimen Description ABSCESS ABDOMEN  Final   Special Requests NONE  Final  Gram Stain   Final    MODERATE WBC PRESENT,BOTH PMN AND MONONUCLEAR ABUNDANT GRAM POSITIVE COCCI IN CLUSTERS FEW GRAM POSITIVE COCCI IN CHAINS MODERATE GRAM NEGATIVE RODS Performed at [facility], [place] [address], [place], [place]  [address]    Culture   Final    ABUNDANT ESCHERICHIA COLI MIXED ANAEROBIC FLORA PRESENT.  CALL LAB IF FURTHER IID REQUIRED.    Report Status 12/16/2020 FINAL  Final   Organism ID, Bacteria ESCHERICHIA COLI  Final      Susceptibility   Escherichia coli - MIC*    AMPICILLIN >=32 RESISTANT Resistant     CEFAZOLIN 8 SENSITIVE Sensitive     CEFEPIME <=0.12 SENSITIVE Sensitive     CEFTAZIDIME <=1 SENSITIVE Sensitive     CEFTRIAXONE <=0.25 SENSITIVE Sensitive     CIPROFLOXACIN <=0.25 SENSITIVE Sensitive     GENTAMICIN <=1 SENSITIVE Sensitive     IMIPENEM <=0.25 SENSITIVE Sensitive     TRIMETH/SULFA <=20 SENSITIVE Sensitive     AMPICILLIN/SULBACTAM >=32 RESISTANT Resistant     PIP/TAZO <=4 SENSITIVE Sensitive     * ABUNDANT ESCHERICHIA COLI     Labs: BNP (last 3 results) No results for input(s): BNP in the last 8760 hours. Basic Metabolic Panel: Recent Labs  Lab 12/15/20 0858 12/18/20 0417 12/19/20 0803 12/20/20 0436  NA 141 144 144 137  K 3.6 3.1* 3.9 3.7  CL 108 108 111 105  CO2 25 26 26 25   GLUCOSE 105* 111* 119* 114*  BUN 20 34* 32* 32*  CREATININE 0.83 0.70 0.73 0.88  CALCIUM 8.9 9.5 9.5 9.0  MG  --   --  2.1  --    Liver Function Tests: Recent Labs  Lab 12/15/20 0858 12/18/20 0417 12/19/20 0803 12/20/20 0436  AST 28 33 38 97*  ALT 14 19 21  57*  ALKPHOS 155* 146* 138* 199*  BILITOT 0.4 0.5 0.4 0.5  PROT 7.1 7.5 7.4 7.8  ALBUMIN 1.8* 1.9* 1.8* 2.0*   No results for input(s): LIPASE, AMYLASE in the last 168 hours. No results for input(s): AMMONIA in the last 168 hours. CBC: Recent Labs  Lab 12/15/20 0858 12/18/20 0417 12/19/20 0803 12/19/20 2310 12/20/20 0436  WBC 19.4* 19.3* 20.3*  --  20.1*  NEUTROABS  --   --  15.6*  --  14.9*  HGB 7.7* 7.5* 6.9* 9.1* 9.4*  HCT 24.8* 24.4* 23.0* 28.5* 29.9*  MCV 97.6 98.8 99.6  --  94.6  PLT 555* 511* 403*  --  418*   Cardiac Enzymes: No results for input(s): CKTOTAL, CKMB, CKMBINDEX, TROPONINI in the last 168  hours. BNP: Invalid input(s): POCBNP CBG: No results for input(s): GLUCAP in the last 168 hours. D-Dimer No results for input(s): DDIMER in the last 72 hours. Hgb A1c No results for input(s): HGBA1C in the last 72 hours. Lipid Profile No results for input(s): CHOL, HDL, LDLCALC, TRIG, CHOLHDL, LDLDIRECT in the last 72 hours. Thyroid function studies No results for input(s): TSH, T4TOTAL, T3FREE, THYROIDAB in the last 72 hours.  Invalid input(s): FREET3 Anemia work up No results for input(s): VITAMINB12, FOLATE, FERRITIN, TIBC, IRON, RETICCTPCT in the last 72 hours. Urinalysis    Component Value Date/Time   COLORURINE RED (A) 11/27/2020 1525   COLORURINE YELLOW 11/27/2020 1525   APPEARANCEUR CLOUDY (A) 11/27/2020 1525   APPEARANCEUR CLOUDY (A) 11/27/2020 1525   LABSPEC  11/27/2020 1525    TEST NOT REPORTED DUE TO COLOR INTERFERENCE OF URINE PIGMENT   LABSPEC 1.016  11/27/2020 1525   PHURINE  11/27/2020 1525    TEST NOT REPORTED DUE TO COLOR INTERFERENCE OF URINE PIGMENT   PHURINE 6.0 11/27/2020 1525   GLUCOSEU (A) 11/27/2020 1525    TEST NOT REPORTED DUE TO COLOR INTERFERENCE OF URINE PIGMENT   GLUCOSEU NEGATIVE 11/27/2020 1525   HGBUR (A) 11/27/2020 1525    TEST NOT REPORTED DUE TO COLOR INTERFERENCE OF URINE PIGMENT   HGBUR LARGE (A) 11/27/2020 1525   BILIRUBINUR (A) 11/27/2020 1525    TEST NOT REPORTED DUE TO COLOR INTERFERENCE OF URINE PIGMENT   BILIRUBINUR NEGATIVE 11/27/2020 1525   BILIRUBINUR negative 09/14/2019 1105   KETONESUR (A) 11/27/2020 1525    TEST NOT REPORTED DUE TO COLOR INTERFERENCE OF URINE PIGMENT   KETONESUR NEGATIVE 11/27/2020 1525   PROTEINUR (A) 11/27/2020 1525    TEST NOT REPORTED DUE TO COLOR INTERFERENCE OF URINE PIGMENT   PROTEINUR 100 (A) 11/27/2020 1525   UROBILINOGEN 0.2 09/14/2019 1105   NITRITE (A) 11/27/2020 1525    TEST NOT REPORTED DUE TO COLOR INTERFERENCE OF URINE PIGMENT   NITRITE NEGATIVE 11/27/2020 1525   LEUKOCYTESUR (A)  11/27/2020 1525    TEST NOT REPORTED DUE TO COLOR INTERFERENCE OF URINE PIGMENT   LEUKOCYTESUR NEGATIVE 11/27/2020 1525   Sepsis Labs Invalid input(s): PROCALCITONIN,  WBC,  LACTICIDVEN Microbiology Recent Results (from the past 240 hour(s))  Aerobic/Anaerobic Culture w Gram Stain (surgical/deep wound)     Status: None   Collection Time: 12/13/20 11:05 AM   Specimen: Abscess  Result Value Ref Range Status   Specimen Description ABSCESS ABDOMEN  Final   Special Requests NONE  Final   Gram Stain   Final    MODERATE WBC PRESENT,BOTH PMN AND MONONUCLEAR ABUNDANT GRAM POSITIVE COCCI IN CLUSTERS FEW GRAM POSITIVE COCCI IN CHAINS MODERATE GRAM NEGATIVE RODS Performed at [facility], [place] [address] [place], [place] [address]    Culture   Final    ABUNDANT ESCHERICHIA COLI MIXED ANAEROBIC FLORA PRESENT.  CALL LAB IF FURTHER IID REQUIRED.    Report Status 12/16/2020 FINAL  Final   Organism ID, Bacteria ESCHERICHIA COLI  Final      Susceptibility   Escherichia coli - MIC*    AMPICILLIN >=32 RESISTANT Resistant     CEFAZOLIN 8 SENSITIVE Sensitive     CEFEPIME <=0.12 SENSITIVE Sensitive     CEFTAZIDIME <=1 SENSITIVE Sensitive     CEFTRIAXONE <=0.25 SENSITIVE Sensitive     CIPROFLOXACIN <=0.25 SENSITIVE Sensitive     GENTAMICIN <=1 SENSITIVE Sensitive     IMIPENEM <=0.25 SENSITIVE Sensitive     TRIMETH/SULFA <=20 SENSITIVE Sensitive     AMPICILLIN/SULBACTAM >=32 RESISTANT Resistant     PIP/TAZO <=4 SENSITIVE Sensitive     * ABUNDANT ESCHERICHIA COLI     Time coordinating discharge: 40 minutes  SIGNED:   Elmarie Shiley, MD  [facility]

## 2020-12-20 NOTE — Progress Notes (Signed)
Progress Note  22 Days Post-Op  Subjective: No new complaints. Hasn't worked with therapies in several days.  Drank 3 premier shakes yesterday.  Eating some solid food in the interim.  Objective: Vital signs in last 24 hours: Temp:  [97.6 F (36.4 C)-99.4 F (37.4 C)] 97.6 F (36.4 C) (09/16 0434) Pulse Rate:  [70-81] 70 (09/16 0815) Resp:  [15-20] 18 (09/16 0434) BP: (115-148)/(61-79) 145/72 (09/16 0815) SpO2:  [95 %-100 %] 100 % (09/16 0434) Last BM Date: 12/18/20  Intake/Output from previous day: 09/15 0701 - 09/16 0700 In: 1294 [P.O.:236; I.V.:250; Blood:698; IV Piggyback:100] Out: 2353 [Urine:1450; Drains:50; Stool:350] Intake/Output this shift: No intake/output data recorded.  PE: General appearance: frail appearing, but NAD Resp: normal effort  GI: Midline wound is actually cleaning up and slough at base of the wound is lessened.  Most of wound is red granulation tissue.      Drainage in the surgical JP is unchanged brownish, cloudy colored fluid, 1-2cc yesterday. Stoma viable and putting out stool. IR drain with purulent/tan, millky looking fluid, increased to 50cc yesterday   Lab Results:  Recent Labs    12/19/20 0803 12/19/20 2310 12/20/20 0436  WBC 20.3*  --  20.1*  HGB 6.9* 9.1* 9.4*  HCT 23.0* 28.5* 29.9*  PLT 403*  --  418*   BMET Recent Labs    12/19/20 0803 12/20/20 0436  NA 144 137  K 3.9 3.7  CL 111 105  CO2 26 25  GLUCOSE 119* 114*  BUN 32* 32*  CREATININE 0.73 0.88  CALCIUM 9.5 9.0   PT/INR No results for input(s): LABPROT, INR in the last 72 hours. CMP     Component Value Date/Time   NA 137 12/20/2020 0436   NA 143 11/25/2020 1554   K 3.7 12/20/2020 0436   CL 105 12/20/2020 0436   CO2 25 12/20/2020 0436   GLUCOSE 114 (H) 12/20/2020 0436   BUN 32 (H) 12/20/2020 0436   BUN 23 11/25/2020 1554   CREATININE 0.88 12/20/2020 0436   CREATININE 1.00 11/14/2020 1146   CALCIUM 9.0 12/20/2020 0436   PROT 7.8 12/20/2020 0436    ALBUMIN 2.0 (L) 12/20/2020 0436   AST 97 (H) 12/20/2020 0436   AST 35 11/14/2020 1146   ALT 57 (H) 12/20/2020 0436   ALT 45 (H) 11/14/2020 1146   ALKPHOS 199 (H) 12/20/2020 0436   BILITOT 0.5 12/20/2020 0436   BILITOT 0.3 11/14/2020 1146   GFRNONAA >60 12/20/2020 0436   GFRNONAA >60 11/14/2020 1146   GFRAA >60 11/29/2019 1306   Lipase     Component Value Date/Time   LIPASE 22 11/27/2020 1035       Studies/Results: No results found.  Anti-infectives: Anti-infectives (From admission, onward)    Start     Dose/Rate Route Frequency Ordered Stop   11/28/20 2230  piperacillin-tazobactam (ZOSYN) IVPB 3.375 g        3.375 g 12.5 mL/hr over 240 Minutes Intravenous Every 8 hours 11/28/20 2121     11/28/20 2215  piperacillin-tazobactam (ZOSYN) IVPB 3.375 g  Status:  Discontinued       Note to Pharmacy: Given Intraoperative   3.375 g 100 mL/hr over 30 Minutes Intravenous Every 8 hours 11/28/20 2117 11/28/20 2121   11/28/20 1730  piperacillin-tazobactam (ZOSYN) IVPB 3.375 g       Note to Pharmacy: Given Intraoperative   3.375 g 100 mL/hr over 30 Minutes Intravenous  Once 11/28/20 1729 11/28/20 1750   11/28/20 1712  piperacillin-tazobactam (ZOSYN) 3.375 GM/50ML IVPB       Note to Pharmacy: Marchia Meiers   : cabinet override      11/28/20 1712 11/28/20 1735   11/28/20 1545  ceFAZolin (ANCEF) IVPB 2g/100 mL premix        2 g 200 mL/hr over 30 Minutes Intravenous  Once 11/28/20 1538 11/28/20 1630   11/28/20 1539  ceFAZolin (ANCEF) 2-4 GM/100ML-% IVPB       Note to Pharmacy: Minor, Anneita   : cabinet override      11/28/20 1539 11/28/20 1654   11/27/20 1700  cephALEXin (KEFLEX) capsule 500 mg  Status:  Discontinued        500 mg Oral 3 times daily 11/27/20 1640 11/28/20 2117        Assessment/Plan Fecal impaction/Severe acute on chronic constipation/acute perforation after enema POD#19 s/p ex lap, sigmoid colectomy, end colostomy Dr. Kae Heller 8/25 Findings of large  perforation with diffuse feculent peritonitis and extensive contamination of the peritoneal cavity with stool and pus - CT 9/8 with collection in L abdomen around stoma - s/p IR drainage 9/9, cxs with E. Coli and s/s are pending  - on a soft diet - WOC following - BID wet-to-dry dressing changes to midline wound, restarted Dakin's x5d for pseudomonal colonization on 9/15 after just stopping it on 9/14 - incentive spirometry, PT/OT - mobilize as able.  We really discussed this being something important for him to get better if that remains his goal, which he states it still is. -WBC stable around 19-20K.  Discussed zosyn with ID who reviewed chart and agrees that zosyn is sufficient at this time and does not recommend any changes currently. -IR drain output increase yesterday.  As long as patient stable, will plan to rescan when output decreases.  No need to scan before that time otherwise. -surgically stable for DC to LTAC when bed available   FEN: soft diet, ensure, pro-source ID: keflex. Zosyn 8/25>>  VTE: heparin gtt on hold due to anemia Follow up:  Dr. Kae Heller   Metastatic prostate cancer ABL anemia on anemia of chronic disease - hgb 6.9 yesterday, got blood per medicine Severe malnutrition - working on drinking Premier shakes and eating some solid food  LOS: 23 days    Henreitta Cea, [facility] 12/20/2020, 8:39 AM Please see Amion for pager number during day hours 7:00am-4:30pm

## 2020-12-21 LAB — COMPREHENSIVE METABOLIC PANEL
ALT: 54 U/L — ABNORMAL HIGH (ref 0–44)
AST: 71 U/L — ABNORMAL HIGH (ref 15–41)
Albumin: 1.6 g/dL — ABNORMAL LOW (ref 3.5–5.0)
Alkaline Phosphatase: 183 U/L — ABNORMAL HIGH (ref 38–126)
Anion gap: 11 (ref 5–15)
BUN: 24 mg/dL — ABNORMAL HIGH (ref 8–23)
CO2: 21 mmol/L — ABNORMAL LOW (ref 22–32)
Calcium: 9.3 mg/dL (ref 8.9–10.3)
Chloride: 105 mmol/L (ref 98–111)
Creatinine, Ser: 0.8 mg/dL (ref 0.61–1.24)
GFR, Estimated: >60 mL/min (ref >60)
Glucose, Bld: 121 mg/dL — ABNORMAL HIGH (ref 70–99)
Potassium: 3.7 mmol/L (ref 3.5–5.1)
Sodium: 137 mmol/L (ref 135–145)
Total Bilirubin: 0.3 mg/dL (ref 0.3–1.2)
Total Protein: 7.2 g/dL (ref 6.5–8.1)

## 2020-12-21 LAB — CBC WITH DIFFERENTIAL/PLATELET
Abs Immature Granulocytes: 0.48 10*3/uL — ABNORMAL HIGH (ref 0.00–0.07)
Basophils Absolute: 0.1 10*3/uL (ref 0.0–0.1)
Basophils Relative: 1 %
Eosinophils Absolute: 0.3 10*3/uL (ref 0.0–0.5)
Eosinophils Relative: 2 %
HCT: 31.5 % — ABNORMAL LOW (ref 39.0–52.0)
Hemoglobin: 10 g/dL — ABNORMAL LOW (ref 13.0–17.0)
Immature Granulocytes: 3 %
Lymphocytes Relative: 10 %
Lymphs Abs: 1.7 10*3/uL (ref 0.7–4.0)
MCH: 30.3 pg (ref 26.0–34.0)
MCHC: 31.7 g/dL (ref 30.0–36.0)
MCV: 95.5 fL (ref 80.0–100.0)
Monocytes Absolute: 1.2 10*3/uL — ABNORMAL HIGH (ref 0.1–1.0)
Monocytes Relative: 7 %
Neutro Abs: 13 10*3/uL — ABNORMAL HIGH (ref 1.7–7.7)
Neutrophils Relative %: 77 %
Platelets: 480 10*3/uL — ABNORMAL HIGH (ref 150–400)
RBC: 3.3 MIL/uL — ABNORMAL LOW (ref 4.22–5.81)
RDW: 18 % — ABNORMAL HIGH (ref 11.5–15.5)
WBC: 16.8 10*3/uL — ABNORMAL HIGH (ref 4.0–10.5)
nRBC: 0 % (ref 0.0–0.2)

## 2020-12-21 LAB — URINE CULTURE
Culture: NO GROWTH
Culture: NO GROWTH

## 2020-12-21 LAB — PROTIME-INR
INR: 1.3 — ABNORMAL HIGH (ref 0.8–1.2)
Prothrombin Time: 15.9 seconds — ABNORMAL HIGH (ref 11.4–15.2)

## 2020-12-21 LAB — PHOSPHORUS: Phosphorus: 2.9 mg/dL (ref 2.5–4.6)

## 2020-12-21 LAB — TSH: TSH: 1.411 u[IU]/mL (ref 0.350–4.500)

## 2020-12-21 LAB — MAGNESIUM: Magnesium: 1.9 mg/dL (ref 1.7–2.4)

## 2020-12-22 ENCOUNTER — Other Ambulatory Visit (HOSPITAL_COMMUNITY): Payer: Medicare Other

## 2020-12-22 ENCOUNTER — Encounter (HOSPITAL_COMMUNITY): Payer: Medicare Other

## 2020-12-22 DIAGNOSIS — R609 Edema, unspecified: Secondary | ICD-10-CM

## 2020-12-22 DIAGNOSIS — M7989 Other specified soft tissue disorders: Secondary | ICD-10-CM

## 2020-12-22 LAB — CBC
HCT: 32.2 % — ABNORMAL LOW (ref 39.0–52.0)
Hemoglobin: 10.2 g/dL — ABNORMAL LOW (ref 13.0–17.0)
MCH: 29.8 pg (ref 26.0–34.0)
MCHC: 31.7 g/dL (ref 30.0–36.0)
MCV: 94.2 fL (ref 80.0–100.0)
Platelets: 437 10*3/uL — ABNORMAL HIGH (ref 150–400)
RBC: 3.42 MIL/uL — ABNORMAL LOW (ref 4.22–5.81)
RDW: 17.3 % — ABNORMAL HIGH (ref 11.5–15.5)
WBC: 17.5 10*3/uL — ABNORMAL HIGH (ref 4.0–10.5)
nRBC: 0 % (ref 0.0–0.2)

## 2020-12-22 LAB — BASIC METABOLIC PANEL
Anion gap: 8 (ref 5–15)
BUN: 15 mg/dL (ref 8–23)
CO2: 26 mmol/L (ref 22–32)
Calcium: 9.1 mg/dL (ref 8.9–10.3)
Chloride: 104 mmol/L (ref 98–111)
Creatinine, Ser: 0.82 mg/dL (ref 0.61–1.24)
GFR, Estimated: >60 mL/min (ref >60)
Glucose, Bld: 100 mg/dL — ABNORMAL HIGH (ref 70–99)
Potassium: 3.7 mmol/L (ref 3.5–5.1)
Sodium: 138 mmol/L (ref 135–145)

## 2020-12-22 LAB — MAGNESIUM: Magnesium: 1.9 mg/dL (ref 1.7–2.4)

## 2020-12-22 LAB — PHOSPHORUS: Phosphorus: 2.6 mg/dL (ref 2.5–4.6)

## 2020-12-22 NOTE — Progress Notes (Signed)
Lower extremity venous has been completed.   Preliminary results in CV Proc.   Jinny Blossom Chelsea Pedretti 12/22/2020 2:52 PM

## 2020-12-24 LAB — OCCULT BLOOD X 1 CARD TO LAB, STOOL: Fecal Occult Bld: POSITIVE — AB

## 2020-12-25 ENCOUNTER — Other Ambulatory Visit (HOSPITAL_COMMUNITY): Payer: Medicare Other

## 2020-12-25 LAB — CBC
HCT: 21.3 % — ABNORMAL LOW (ref 39.0–52.0)
Hemoglobin: 6.9 g/dL — CL (ref 13.0–17.0)
MCH: 31.4 pg (ref 26.0–34.0)
MCHC: 32.4 g/dL (ref 30.0–36.0)
MCV: 96.8 fL (ref 80.0–100.0)
Platelets: 432 10*3/uL — ABNORMAL HIGH (ref 150–400)
RBC: 2.2 MIL/uL — ABNORMAL LOW (ref 4.22–5.81)
RDW: 16.9 % — ABNORMAL HIGH (ref 11.5–15.5)
WBC: 20.3 10*3/uL — ABNORMAL HIGH (ref 4.0–10.5)
nRBC: 0 % (ref 0.0–0.2)

## 2020-12-25 LAB — URINALYSIS, ROUTINE W REFLEX MICROSCOPIC

## 2020-12-25 LAB — RENAL FUNCTION PANEL
Albumin: 1.4 g/dL — ABNORMAL LOW (ref 3.5–5.0)
Anion gap: 7 (ref 5–15)
BUN: 16 mg/dL (ref 8–23)
CO2: 24 mmol/L (ref 22–32)
Calcium: 8.9 mg/dL (ref 8.9–10.3)
Chloride: 107 mmol/L (ref 98–111)
Creatinine, Ser: 0.7 mg/dL (ref 0.61–1.24)
GFR, Estimated: >60 mL/min (ref >60)
Glucose, Bld: 104 mg/dL — ABNORMAL HIGH (ref 70–99)
Phosphorus: 2.7 mg/dL (ref 2.5–4.6)
Potassium: 3.6 mmol/L (ref 3.5–5.1)
Sodium: 138 mmol/L (ref 135–145)

## 2020-12-25 LAB — URINALYSIS, MICROSCOPIC (REFLEX)
RBC / HPF: >50 RBC/hpf (ref 0–5)
Squamous Epithelial / HPF: NONE SEEN (ref 0–5)

## 2020-12-25 LAB — PREPARE RBC (CROSSMATCH)

## 2020-12-25 LAB — MAGNESIUM: Magnesium: 1.6 mg/dL — ABNORMAL LOW (ref 1.7–2.4)

## 2020-12-25 LAB — LACTIC ACID, PLASMA
Lactic Acid, Venous: 0.8 mmol/L (ref 0.5–1.9)
Lactic Acid, Venous: 0.8 mmol/L (ref 0.5–1.9)

## 2020-12-25 LAB — PROTIME-INR
INR: 1.3 — ABNORMAL HIGH (ref 0.8–1.2)
Prothrombin Time: 16.2 seconds — ABNORMAL HIGH (ref 11.4–15.2)

## 2020-12-25 MED FILL — Dexamethasone Sodium Phosphate Inj 100 MG/10ML: INTRAMUSCULAR | Qty: 1 | Status: AC

## 2020-12-26 ENCOUNTER — Other Ambulatory Visit: Payer: Medicare Other

## 2020-12-26 ENCOUNTER — Ambulatory Visit: Payer: Medicare Other | Admitting: Oncology

## 2020-12-26 ENCOUNTER — Ambulatory Visit: Payer: Medicare Other

## 2020-12-26 LAB — TYPE AND SCREEN
ABO/RH(D): A POS
Antibody Screen: NEGATIVE
Unit division: 0

## 2020-12-26 LAB — BPAM RBC
Blood Product Expiration Date: 202210192359
ISSUE DATE / TIME: 202209211210
Unit Type and Rh: 6200

## 2020-12-26 LAB — CBC
HCT: 25.2 % — ABNORMAL LOW (ref 39.0–52.0)
Hemoglobin: 8.3 g/dL — ABNORMAL LOW (ref 13.0–17.0)
MCH: 30.7 pg (ref 26.0–34.0)
MCHC: 32.9 g/dL (ref 30.0–36.0)
MCV: 93.3 fL (ref 80.0–100.0)
Platelets: 464 10*3/uL — ABNORMAL HIGH (ref 150–400)
RBC: 2.7 MIL/uL — ABNORMAL LOW (ref 4.22–5.81)
RDW: 16.9 % — ABNORMAL HIGH (ref 11.5–15.5)
WBC: 19.4 10*3/uL — ABNORMAL HIGH (ref 4.0–10.5)
nRBC: 0 % (ref 0.0–0.2)

## 2020-12-26 LAB — URINE CULTURE: Culture: NO GROWTH

## 2020-12-26 LAB — VANCOMYCIN, TROUGH: Vancomycin Tr: 20 ug/mL (ref 15–20)

## 2020-12-26 LAB — MAGNESIUM: Magnesium: 1.9 mg/dL (ref 1.7–2.4)

## 2020-12-27 ENCOUNTER — Other Ambulatory Visit (HOSPITAL_COMMUNITY): Payer: Medicare Other

## 2020-12-27 LAB — RENAL FUNCTION PANEL
Albumin: 1.3 g/dL — ABNORMAL LOW (ref 3.5–5.0)
Anion gap: 7 (ref 5–15)
BUN: 8 mg/dL (ref 8–23)
CO2: 22 mmol/L (ref 22–32)
Calcium: 8.1 mg/dL — ABNORMAL LOW (ref 8.9–10.3)
Chloride: 107 mmol/L (ref 98–111)
Creatinine, Ser: 0.68 mg/dL (ref 0.61–1.24)
GFR, Estimated: >60 mL/min (ref >60)
Glucose, Bld: 96 mg/dL (ref 70–99)
Phosphorus: 2.8 mg/dL (ref 2.5–4.6)
Potassium: 3.1 mmol/L — ABNORMAL LOW (ref 3.5–5.1)
Sodium: 136 mmol/L (ref 135–145)

## 2020-12-27 LAB — CBC
HCT: 24.1 % — ABNORMAL LOW (ref 39.0–52.0)
Hemoglobin: 7.7 g/dL — ABNORMAL LOW (ref 13.0–17.0)
MCH: 30.6 pg (ref 26.0–34.0)
MCHC: 32 g/dL (ref 30.0–36.0)
MCV: 95.6 fL (ref 80.0–100.0)
Platelets: 458 10*3/uL — ABNORMAL HIGH (ref 150–400)
RBC: 2.52 MIL/uL — ABNORMAL LOW (ref 4.22–5.81)
RDW: 16.8 % — ABNORMAL HIGH (ref 11.5–15.5)
WBC: 17.5 10*3/uL — ABNORMAL HIGH (ref 4.0–10.5)
nRBC: 0 % (ref 0.0–0.2)

## 2020-12-27 LAB — MAGNESIUM: Magnesium: 2 mg/dL (ref 1.7–2.4)

## 2020-12-28 ENCOUNTER — Inpatient Hospital Stay: Payer: Medicare Other

## 2020-12-28 LAB — URINE CULTURE
Culture: NO GROWTH
Culture: NO GROWTH

## 2020-12-28 LAB — POTASSIUM: Potassium: 3.3 mmol/L — ABNORMAL LOW (ref 3.5–5.1)

## 2020-12-28 LAB — CBC
HCT: 23.5 % — ABNORMAL LOW (ref 39.0–52.0)
Hemoglobin: 7.3 g/dL — ABNORMAL LOW (ref 13.0–17.0)
MCH: 30.2 pg (ref 26.0–34.0)
MCHC: 31.1 g/dL (ref 30.0–36.0)
MCV: 97.1 fL (ref 80.0–100.0)
Platelets: 523 10*3/uL — ABNORMAL HIGH (ref 150–400)
RBC: 2.42 MIL/uL — ABNORMAL LOW (ref 4.22–5.81)
RDW: 16.8 % — ABNORMAL HIGH (ref 11.5–15.5)
WBC: 18.2 10*3/uL — ABNORMAL HIGH (ref 4.0–10.5)
nRBC: 0 % (ref 0.0–0.2)

## 2020-12-29 LAB — CBC
HCT: 24.7 % — ABNORMAL LOW (ref 39.0–52.0)
Hemoglobin: 8.2 g/dL — ABNORMAL LOW (ref 13.0–17.0)
MCH: 31.3 pg (ref 26.0–34.0)
MCHC: 33.2 g/dL (ref 30.0–36.0)
MCV: 94.3 fL (ref 80.0–100.0)
Platelets: 504 10*3/uL — ABNORMAL HIGH (ref 150–400)
RBC: 2.62 MIL/uL — ABNORMAL LOW (ref 4.22–5.81)
RDW: 16.4 % — ABNORMAL HIGH (ref 11.5–15.5)
WBC: 17 10*3/uL — ABNORMAL HIGH (ref 4.0–10.5)
nRBC: 0 % (ref 0.0–0.2)

## 2020-12-29 LAB — POTASSIUM: Potassium: 3.5 mmol/L (ref 3.5–5.1)

## 2020-12-29 LAB — CULTURE, BLOOD (ROUTINE X 2)
Culture: NO GROWTH
Culture: NO GROWTH
Special Requests: ADEQUATE
Special Requests: ADEQUATE

## 2020-12-29 LAB — VANCOMYCIN, TROUGH: Vancomycin Tr: 37 ug/mL (ref 15–20)

## 2020-12-30 LAB — BASIC METABOLIC PANEL
Anion gap: 5 (ref 5–15)
BUN: 6 mg/dL — ABNORMAL LOW (ref 8–23)
CO2: 23 mmol/L (ref 22–32)
Calcium: 8.7 mg/dL — ABNORMAL LOW (ref 8.9–10.3)
Chloride: 112 mmol/L — ABNORMAL HIGH (ref 98–111)
Creatinine, Ser: 1.01 mg/dL (ref 0.61–1.24)
GFR, Estimated: >60 mL/min (ref >60)
Glucose, Bld: 107 mg/dL — ABNORMAL HIGH (ref 70–99)
Potassium: 4.2 mmol/L (ref 3.5–5.1)
Sodium: 140 mmol/L (ref 135–145)

## 2020-12-30 LAB — CBC
HCT: 24.8 % — ABNORMAL LOW (ref 39.0–52.0)
Hemoglobin: 7.9 g/dL — ABNORMAL LOW (ref 13.0–17.0)
MCH: 30.9 pg (ref 26.0–34.0)
MCHC: 31.9 g/dL (ref 30.0–36.0)
MCV: 96.9 fL (ref 80.0–100.0)
Platelets: 523 10*3/uL — ABNORMAL HIGH (ref 150–400)
RBC: 2.56 MIL/uL — ABNORMAL LOW (ref 4.22–5.81)
RDW: 16.4 % — ABNORMAL HIGH (ref 11.5–15.5)
WBC: 20.2 10*3/uL — ABNORMAL HIGH (ref 4.0–10.5)
nRBC: 0 % (ref 0.0–0.2)

## 2020-12-30 LAB — VANCOMYCIN, TROUGH: Vancomycin Tr: 19 ug/mL (ref 15–20)

## 2020-12-30 LAB — MAGNESIUM: Magnesium: 1.8 mg/dL (ref 1.7–2.4)

## 2020-12-30 LAB — LACTIC ACID, PLASMA: Lactic Acid, Venous: 1.2 mmol/L (ref 0.5–1.9)

## 2020-12-31 ENCOUNTER — Other Ambulatory Visit (HOSPITAL_COMMUNITY): Payer: Medicare Other

## 2020-12-31 LAB — CBC
HCT: 22.8 % — ABNORMAL LOW (ref 39.0–52.0)
Hemoglobin: 7.2 g/dL — ABNORMAL LOW (ref 13.0–17.0)
MCH: 30.5 pg (ref 26.0–34.0)
MCHC: 31.6 g/dL (ref 30.0–36.0)
MCV: 96.6 fL (ref 80.0–100.0)
Platelets: 452 10*3/uL — ABNORMAL HIGH (ref 150–400)
RBC: 2.36 MIL/uL — ABNORMAL LOW (ref 4.22–5.81)
RDW: 16.5 % — ABNORMAL HIGH (ref 11.5–15.5)
WBC: 16.3 10*3/uL — ABNORMAL HIGH (ref 4.0–10.5)
nRBC: 0 % (ref 0.0–0.2)

## 2020-12-31 LAB — RENAL FUNCTION PANEL
Albumin: <1 g/dL — ABNORMAL LOW (ref 3.5–5.0)
Anion gap: 7 (ref 5–15)
BUN: 10 mg/dL (ref 8–23)
CO2: 21 mmol/L — ABNORMAL LOW (ref 22–32)
Calcium: 8.5 mg/dL — ABNORMAL LOW (ref 8.9–10.3)
Chloride: 111 mmol/L (ref 98–111)
Creatinine, Ser: 1.23 mg/dL (ref 0.61–1.24)
GFR, Estimated: >60 mL/min (ref >60)
Glucose, Bld: 99 mg/dL (ref 70–99)
Phosphorus: 3.7 mg/dL (ref 2.5–4.6)
Potassium: 3.5 mmol/L (ref 3.5–5.1)
Sodium: 139 mmol/L (ref 135–145)

## 2020-12-31 LAB — MAGNESIUM: Magnesium: 1.8 mg/dL (ref 1.7–2.4)

## 2021-01-01 LAB — RENAL FUNCTION PANEL
Albumin: <1.5 g/dL — ABNORMAL LOW (ref 3.5–5.0)
Anion gap: 6 (ref 5–15)
BUN: 9 mg/dL (ref 8–23)
CO2: 20 mmol/L — ABNORMAL LOW (ref 22–32)
Calcium: 8.5 mg/dL — ABNORMAL LOW (ref 8.9–10.3)
Chloride: 112 mmol/L — ABNORMAL HIGH (ref 98–111)
Creatinine, Ser: 1.41 mg/dL — ABNORMAL HIGH (ref 0.61–1.24)
GFR, Estimated: 54 mL/min — ABNORMAL LOW (ref >60)
Glucose, Bld: 73 mg/dL (ref 70–99)
Phosphorus: 3.7 mg/dL (ref 2.5–4.6)
Potassium: 4.4 mmol/L (ref 3.5–5.1)
Sodium: 138 mmol/L (ref 135–145)

## 2021-01-01 LAB — CBC
HCT: 25.2 % — ABNORMAL LOW (ref 39.0–52.0)
Hemoglobin: 8.1 g/dL — ABNORMAL LOW (ref 13.0–17.0)
MCH: 30.7 pg (ref 26.0–34.0)
MCHC: 32.1 g/dL (ref 30.0–36.0)
MCV: 95.5 fL (ref 80.0–100.0)
Platelets: 463 10*3/uL — ABNORMAL HIGH (ref 150–400)
RBC: 2.64 MIL/uL — ABNORMAL LOW (ref 4.22–5.81)
RDW: 16.3 % — ABNORMAL HIGH (ref 11.5–15.5)
WBC: 16.8 10*3/uL — ABNORMAL HIGH (ref 4.0–10.5)
nRBC: 0 % (ref 0.0–0.2)

## 2021-01-01 LAB — MAGNESIUM: Magnesium: 1.7 mg/dL (ref 1.7–2.4)

## 2021-01-02 ENCOUNTER — Other Ambulatory Visit (HOSPITAL_COMMUNITY): Payer: Medicare Other

## 2021-01-02 LAB — RENAL FUNCTION PANEL
Albumin: <1.5 g/dL — ABNORMAL LOW (ref 3.5–5.0)
Anion gap: 7 (ref 5–15)
BUN: 11 mg/dL (ref 8–23)
CO2: 20 mmol/L — ABNORMAL LOW (ref 22–32)
Calcium: 8.4 mg/dL — ABNORMAL LOW (ref 8.9–10.3)
Chloride: 111 mmol/L (ref 98–111)
Creatinine, Ser: 1.45 mg/dL — ABNORMAL HIGH (ref 0.61–1.24)
GFR, Estimated: 52 mL/min — ABNORMAL LOW (ref >60)
Glucose, Bld: 97 mg/dL (ref 70–99)
Phosphorus: 3.1 mg/dL (ref 2.5–4.6)
Potassium: 3.7 mmol/L (ref 3.5–5.1)
Sodium: 138 mmol/L (ref 135–145)

## 2021-01-02 LAB — MAGNESIUM: Magnesium: 1.8 mg/dL (ref 1.7–2.4)

## 2021-01-03 ENCOUNTER — Encounter: Payer: Self-pay | Admitting: Family Medicine

## 2021-01-04 ENCOUNTER — Other Ambulatory Visit (HOSPITAL_COMMUNITY): Payer: Medicare Other

## 2021-01-04 LAB — CULTURE, BLOOD (ROUTINE X 2)
Culture: NO GROWTH
Culture: NO GROWTH
Special Requests: ADEQUATE
Special Requests: ADEQUATE

## 2021-01-04 LAB — RENAL FUNCTION PANEL
Albumin: <1.5 g/dL — ABNORMAL LOW (ref 3.5–5.0)
Anion gap: 7 (ref 5–15)
BUN: 21 mg/dL (ref 8–23)
CO2: 22 mmol/L (ref 22–32)
Calcium: 8.5 mg/dL — ABNORMAL LOW (ref 8.9–10.3)
Chloride: 110 mmol/L (ref 98–111)
Creatinine, Ser: 1.48 mg/dL — ABNORMAL HIGH (ref 0.61–1.24)
GFR, Estimated: 51 mL/min — ABNORMAL LOW (ref >60)
Glucose, Bld: 108 mg/dL — ABNORMAL HIGH (ref 70–99)
Phosphorus: 2.8 mg/dL (ref 2.5–4.6)
Potassium: 3.1 mmol/L — ABNORMAL LOW (ref 3.5–5.1)
Sodium: 139 mmol/L (ref 135–145)

## 2021-01-04 LAB — CBC
HCT: 24.8 % — ABNORMAL LOW (ref 39.0–52.0)
Hemoglobin: 7.7 g/dL — ABNORMAL LOW (ref 13.0–17.0)
MCH: 29.6 pg (ref 26.0–34.0)
MCHC: 31 g/dL (ref 30.0–36.0)
MCV: 95.4 fL (ref 80.0–100.0)
Platelets: 431 10*3/uL — ABNORMAL HIGH (ref 150–400)
RBC: 2.6 MIL/uL — ABNORMAL LOW (ref 4.22–5.81)
RDW: 15.8 % — ABNORMAL HIGH (ref 11.5–15.5)
WBC: 19.7 10*3/uL — ABNORMAL HIGH (ref 4.0–10.5)
nRBC: 0 % (ref 0.0–0.2)

## 2021-01-04 LAB — MAGNESIUM: Magnesium: 1.7 mg/dL (ref 1.7–2.4)

## 2021-01-05 LAB — BASIC METABOLIC PANEL
Anion gap: 8 (ref 5–15)
BUN: 22 mg/dL (ref 8–23)
CO2: 19 mmol/L — ABNORMAL LOW (ref 22–32)
Calcium: 8.5 mg/dL — ABNORMAL LOW (ref 8.9–10.3)
Chloride: 114 mmol/L — ABNORMAL HIGH (ref 98–111)
Creatinine, Ser: 1.42 mg/dL — ABNORMAL HIGH (ref 0.61–1.24)
GFR, Estimated: 53 mL/min — ABNORMAL LOW (ref >60)
Glucose, Bld: 91 mg/dL (ref 70–99)
Potassium: 3.4 mmol/L — ABNORMAL LOW (ref 3.5–5.1)
Sodium: 141 mmol/L (ref 135–145)

## 2021-01-05 LAB — PHOSPHORUS: Phosphorus: 3 mg/dL (ref 2.5–4.6)

## 2021-01-05 LAB — MAGNESIUM: Magnesium: 1.8 mg/dL (ref 1.7–2.4)

## 2021-01-07 LAB — BASIC METABOLIC PANEL
Anion gap: 9 (ref 5–15)
BUN: 28 mg/dL — ABNORMAL HIGH (ref 8–23)
CO2: 22 mmol/L (ref 22–32)
Calcium: 8.3 mg/dL — ABNORMAL LOW (ref 8.9–10.3)
Chloride: 111 mmol/L (ref 98–111)
Creatinine, Ser: 1.39 mg/dL — ABNORMAL HIGH (ref 0.61–1.24)
GFR, Estimated: 55 mL/min — ABNORMAL LOW (ref >60)
Glucose, Bld: 82 mg/dL (ref 70–99)
Potassium: 3.2 mmol/L — ABNORMAL LOW (ref 3.5–5.1)
Sodium: 142 mmol/L (ref 135–145)

## 2021-01-07 LAB — CBC
HCT: 21.1 % — ABNORMAL LOW (ref 39.0–52.0)
Hemoglobin: 6.5 g/dL — CL (ref 13.0–17.0)
MCH: 29.7 pg (ref 26.0–34.0)
MCHC: 30.8 g/dL (ref 30.0–36.0)
MCV: 96.3 fL (ref 80.0–100.0)
Platelets: 365 10*3/uL (ref 150–400)
RBC: 2.19 MIL/uL — ABNORMAL LOW (ref 4.22–5.81)
RDW: 16.2 % — ABNORMAL HIGH (ref 11.5–15.5)
WBC: 14.1 10*3/uL — ABNORMAL HIGH (ref 4.0–10.5)
nRBC: 0 % (ref 0.0–0.2)

## 2021-01-07 LAB — MAGNESIUM: Magnesium: 1.8 mg/dL (ref 1.7–2.4)

## 2021-01-07 LAB — PREPARE RBC (CROSSMATCH)

## 2021-01-08 LAB — BPAM RBC
Blood Product Expiration Date: 202210252359
ISSUE DATE / TIME: 202210041203
Unit Type and Rh: 6200

## 2021-01-08 LAB — TYPE AND SCREEN
ABO/RH(D): A POS
Antibody Screen: NEGATIVE
Unit division: 0

## 2021-01-08 LAB — CBC
HCT: 24.5 % — ABNORMAL LOW (ref 39.0–52.0)
Hemoglobin: 7.9 g/dL — ABNORMAL LOW (ref 13.0–17.0)
MCH: 29.9 pg (ref 26.0–34.0)
MCHC: 32.2 g/dL (ref 30.0–36.0)
MCV: 92.8 fL (ref 80.0–100.0)
Platelets: 380 10*3/uL (ref 150–400)
RBC: 2.64 MIL/uL — ABNORMAL LOW (ref 4.22–5.81)
RDW: 16.8 % — ABNORMAL HIGH (ref 11.5–15.5)
WBC: 14.7 10*3/uL — ABNORMAL HIGH (ref 4.0–10.5)
nRBC: 0 % (ref 0.0–0.2)

## 2021-01-08 LAB — POTASSIUM: Potassium: 3.6 mmol/L (ref 3.5–5.1)

## 2021-01-09 LAB — RENAL FUNCTION PANEL
Albumin: <1.5 g/dL — ABNORMAL LOW (ref 3.5–5.0)
Anion gap: 10 (ref 5–15)
BUN: 25 mg/dL — ABNORMAL HIGH (ref 8–23)
CO2: 21 mmol/L — ABNORMAL LOW (ref 22–32)
Calcium: 8.3 mg/dL — ABNORMAL LOW (ref 8.9–10.3)
Chloride: 108 mmol/L (ref 98–111)
Creatinine, Ser: 1.22 mg/dL (ref 0.61–1.24)
GFR, Estimated: >60 mL/min (ref >60)
Glucose, Bld: 102 mg/dL — ABNORMAL HIGH (ref 70–99)
Phosphorus: 2.9 mg/dL (ref 2.5–4.6)
Potassium: 3.1 mmol/L — ABNORMAL LOW (ref 3.5–5.1)
Sodium: 139 mmol/L (ref 135–145)

## 2021-01-10 ENCOUNTER — Other Ambulatory Visit: Payer: Self-pay | Admitting: Family Medicine

## 2021-01-10 NOTE — Consult Note (Signed)
Infectious Disease Consultation   GIAN YBARRA  HYI:[id number]  DOB: 05/03/1950  DOA: 12/20/2020  Requesting physician: Dr. Nathaniel Man  Reason for consultation: Antibiotic Recommendations  History of Present Illness: Andre Glass is an 70 y.o. male with medical history significant for metastatic prostate cancer, chronic bilateral nephrostomies due to obstructive uropathy from urinary cancer who was admitted to acute facility with right-sided abdominal pain.  Initial CT scan did not show evidence of obstructive pathology. He was seen by GI and general surgery and they recommended to continue bowel regimen and continued observation.  CT also showed bilateral DVTs.  He was started on anticoagulation.  He was receiving enemas and unfortunately developed perforation of the colon with peritonitis.  He was taken to the OR and underwent laparotomy, partial colectomy and colostomy.  Biopsies from the surgery confirmed metastatic prostate cancer.  There is also concern for underlying abscess and he had a JP drain in place.  His cultures came back positive for Pseudomonas, E. coli. He received treatment with IV Zosyn.  He was seen by oncology and palliative care.  However, family wanted to continue treatment in hopes for improvement therefore to [facility].  After admission here he is having fevers.  CT of the abdomen pelvis on 12/31/2020 showed moderate bilateral pleural effusions, lower lobe atelectasis, improving abscess along the posterior right hepatic margin, pericapsular splenic abscess 8.6 x 4.7 cm, postoperative changes of left hemicolectomy with left lower quadrant colostomy, retroperitoneal adenopathy, pelvic lymphadenopathy with pelvic soft tissue implant.  He was also noted to have multifocal sclerotic metastasis at T12, L2, L4, sacrum and bilateral inferior pubic rami. Also noted was moderate left hydroureteronephrosis mildly progressive, thick-walled bladder with suspected bladder  hemorrhage.  Review of Systems:  ROS As per HPI otherwise 10 point review of systems negative.   Past Medical History: Past Medical History:  Diagnosis Date   Anemia    History of kidney stones    Prostate cancer ([place])    UTI (urinary tract infection)     Past Surgical History: Past Surgical History:  Procedure Laterality Date   COLOSTOMY N/A 11/28/2020   Procedure: COLOSTOMY;  Surgeon: Clovis Riley, MD;  Location: [facility];  Service: General;  Laterality: N/A;   IR IMAGING GUIDED PORT INSERTION  01/31/2020   IR NEPHROSTOGRAM LEFT THRU EXISTING ACCESS  11/20/2020   IR NEPHROSTOGRAM RIGHT THRU EXISTING ACCESS  11/20/2020   IR NEPHROSTOMY EXCHANGE RIGHT  10/25/2020   IR NEPHROSTOMY PLACEMENT LEFT  09/29/2020   IR NEPHROSTOMY PLACEMENT LEFT  11/02/2020   IR NEPHROSTOMY PLACEMENT RIGHT  09/29/2020   IR NEPHROSTOMY PLACEMENT RIGHT  11/02/2020   IR URETERAL STENT PERC [place] MOD SED  11/19/2020   IR URETERAL STENT PERC [place] MOD SED  11/19/2020   LAPAROTOMY N/A 11/28/2020   Procedure: EXPLORATORY LAPAROTOMY/ SIGMOID COLECTOMY;  Surgeon: Clovis Riley, MD;  Location: [facility];  Service: General;  Laterality: N/A;   TRANSURETHRAL RESECTION OF PROSTATE N/A 11/08/2019   Procedure: TRANSURETHRAL RESECTION OF THE PROSTATE (TURP);  Surgeon: [name], MD;  Location: [facility];  Service: Urology;  Laterality: N/A;     Allergies:   Allergies  Allergen Reactions   Ibuprofen Anaphylaxis   Shrimp [Shellfish Allergy] Anaphylaxis     Social History:  reports that he has never smoked. He has never used smokeless tobacco. He reports that he does not drink alcohol and does not use  drugs.   Family History: Family History  Problem Relation Age of Onset   Stomach cancer Mother    Pancreatic cancer Sister    Breast cancer Neg Hx    Prostate cancer Neg Hx    Colon cancer Neg Hx    Physical Exam: Vitals: Temperature 98.8, pulse 82, respiratory 19, blood pressure 130/67, oxygen saturation  99%  Constitutional: Awake, not in any acute distress Head: Atraumatic, normocephalic Eyes: PERLA, EOMI, irises appear normal, anicteric sclera,  ENMT: external ears and nose appear normal, normal hearing, lips appears normal, moist oral mucosa Neck: Supple, trachea midline CVS: S1-S2  Respiratory: Decreased breath sounds lower lobes, crackles, no rhonchi, no wheezing Abdomen: soft nontender, nondistended, normal bowel sounds, midline abdominal wound, bilateral nephrostomy tube draining, JP drain in place Musculoskeletal: Lower extremity edema Neuro: Awake, debilitated neurolysed weakness otherwise grossly nonfocal Psych: stable mood and affect, mental status Skin: Midline abdominal wound, unstageable sacrococcygeal pressure ulcer  Data reviewed:  I have personally reviewed labs and imaging studies  No results for input(s): LIPASE, AMYLASE in the last 168 hours. No results for input(s): AMMONIA in the last 168 hours. Coagulation profile No results for input(s): INR, PROTIME in the last 168 hours.  Cardiac Enzymes: No results for input(s): CKTOTAL, CKMB, CKMBINDEX, TROPONINI in the last 168 hours. BNP: Invalid input(s): POCBNP CBG: No results for input(s): GLUCAP in the last 168 hours. D-Dimer No results for input(s): DDIMER in the last 72 hours. Hgb A1c No results for input(s): HGBA1C in the last 72 hours. Lipid Profile No results for input(s): CHOL, HDL, LDLCALC, TRIG, CHOLHDL, LDLDIRECT in the last 72 hours. Thyroid function studies No results for input(s): TSH, T4TOTAL, T3FREE, THYROIDAB in the last 72 hours.  Invalid input(s): FREET3 Anemia work up No results for input(s): VITAMINB12, FOLATE, FERRITIN, TIBC, IRON, RETICCTPCT in the last 72 hours. Urinalysis    Component Value Date/Time   COLORURINE RED (A) 12/25/2020 1230   APPEARANCEUR TURBID (A) 12/25/2020 1230   LABSPEC  12/25/2020 1230    TEST NOT REPORTED DUE TO COLOR INTERFERENCE OF URINE PIGMENT   PHURINE   12/25/2020 1230    TEST NOT REPORTED DUE TO COLOR INTERFERENCE OF URINE PIGMENT   GLUCOSEU (A) 12/25/2020 1230    TEST NOT REPORTED DUE TO COLOR INTERFERENCE OF URINE PIGMENT   HGBUR (A) 12/25/2020 1230    TEST NOT REPORTED DUE TO COLOR INTERFERENCE OF URINE PIGMENT   BILIRUBINUR (A) 12/25/2020 1230    TEST NOT REPORTED DUE TO COLOR INTERFERENCE OF URINE PIGMENT   BILIRUBINUR negative 09/14/2019 1105   KETONESUR (A) 12/25/2020 1230    TEST NOT REPORTED DUE TO COLOR INTERFERENCE OF URINE PIGMENT   PROTEINUR (A) 12/25/2020 1230    TEST NOT REPORTED DUE TO COLOR INTERFERENCE OF URINE PIGMENT   UROBILINOGEN 0.2 09/14/2019 1105   NITRITE (A) 12/25/2020 1230    TEST NOT REPORTED DUE TO COLOR INTERFERENCE OF URINE PIGMENT   LEUKOCYTESUR (A) 12/25/2020 1230    TEST NOT REPORTED DUE TO COLOR INTERFERENCE OF URINE PIGMENT   Sepsis Labs Invalid input(s): PROCALCITONIN,  WBC,  LACTICIDVEN Microbiology No results found for this or any previous visit (from the past 240 hour(s)).  Inpatient Medications:   Scheduled Meds: Please see MAR  Radiological Exams on Admission: No results found.  Impression/Recommendations Active Problems: Bowel perforation with peritonitis Status post exploratory laparotomy with colectomy and end colostomy Intra-abdominal and perihepatic abscess with Pseudomonas, E. Coli Pericapsular splenic abscess Systemic inflammatory response syndrome Fever/leukocytosis  Postoperative abdominal wound Metastatic prostate cancer Bilateral hydronephrosis status post nephrostomy tubes Bilateral lower extremity DVT Severe protein calorie malnutrition/dysphagia Sacral pressure ulcer unstageable  Bowel perforation with peritonitis: He is status post expiratory laparotomy/Hartman partial colectomy with end colostomy on 11/28/2020.  He received treatment with IV Zosyn in the acute facility.  Recommend to continue treatment with Zosyn.  Since he is having fevers recommend to add  vancomycin for gram-positive coverage.  Plan to treat for duration of 2 weeks.  Please monitor BUN/cr closely while on antibiotics.  He has a postoperative abdominal wound.  Continue local wound care.  Intra-abdominal/perihepatic/pericapsular splenic abscess: Cultures at the acute facility showed Pseudomonas, E. coli.  Continue treatment with IV Zosyn.  We will plan to extend antibiotics for a duration of 2 weeks.  He is also having fevers and leukocytosis therefore recommended to add IV vancomycin for gram-positive coverage.  Recommend to repeat CT of the abdomen and pelvis in 2 weeks to evaluate for resolution.  However, he has metastatic prostate cancer with multiple mets therefore we may be unable to completely treat the abscess due to ongoing underlying condition.  Systemic inflammatory response syndrome: He started having fever with leukocytosis.  Recommended to add IV vancomycin to the Zosyn for gram-positive coverage.  Recommended to monitor CBC, BUN/creatinine closely while on antibiotics.  If he continues to have fevers recommend to send for pancultures including blood cultures.  Bilateral hydronephrosis: He is status post nephrostomy tubes.  He is high risk for UTI/sepsis due to this.  Already on antibiotics as mentioned above.  Further management per primary team.  Bilateral lower extremity DVT: He was on heparin drip.  No anticoagulation due to hematuria, low hemoglobin and FOBT positive.  Further management per the primary team.  Severe protein calorie malnutrition/dysphagia: Management of protein calorie malnutrition per the primary team.  Due to his dysphagia he is high risk for aspiration and recurrent aspiration pneumonia despite being on antibiotics.  Sacral pressure ulcer unstageable: Continue local wound care.  Already on antibiotics as mentioned above.  If the pressure ulcer is worsening consider consulting surgery to evaluate for possible debridement.  Unfortunately due to his  debility and inability to offload he is high risk for further worsening of the pressure ulcer.  Metastatic prostate cancer: Per imaging he was noted to have multiple months to T12, L2, L4, sacrum, bilateral pubic rami.  Overall poor prognosis.  He was already seen by palliative care at the acute facility with the family wanted to continue current care.  Unfortunately due to his metastatic prostate cancer, multiple complex medical problems he is very high risk for worsening and decompensation.  Overall poor prognosis.  Recommend to discuss goals of care.  Plan of care discussed with patient, primary team.  Thank you for this consultation.    Yaakov Guthrie M.D.  01/03/2021, 08:30 PM

## 2021-01-11 LAB — RENAL FUNCTION PANEL
Albumin: <1.5 g/dL — ABNORMAL LOW (ref 3.5–5.0)
Anion gap: 7 (ref 5–15)
BUN: 24 mg/dL — ABNORMAL HIGH (ref 8–23)
CO2: 25 mmol/L (ref 22–32)
Calcium: 8.5 mg/dL — ABNORMAL LOW (ref 8.9–10.3)
Chloride: 109 mmol/L (ref 98–111)
Creatinine, Ser: 1.2 mg/dL (ref 0.61–1.24)
GFR, Estimated: >60 mL/min (ref >60)
Glucose, Bld: 93 mg/dL (ref 70–99)
Phosphorus: 2.9 mg/dL (ref 2.5–4.6)
Potassium: 2.7 mmol/L — CL (ref 3.5–5.1)
Sodium: 141 mmol/L (ref 135–145)

## 2021-01-11 LAB — CBC
HCT: 25.6 % — ABNORMAL LOW (ref 39.0–52.0)
Hemoglobin: 8.3 g/dL — ABNORMAL LOW (ref 13.0–17.0)
MCH: 29.9 pg (ref 26.0–34.0)
MCHC: 32.4 g/dL (ref 30.0–36.0)
MCV: 92.1 fL (ref 80.0–100.0)
Platelets: 426 10*3/uL — ABNORMAL HIGH (ref 150–400)
RBC: 2.78 MIL/uL — ABNORMAL LOW (ref 4.22–5.81)
RDW: 16.2 % — ABNORMAL HIGH (ref 11.5–15.5)
WBC: 15.8 10*3/uL — ABNORMAL HIGH (ref 4.0–10.5)
nRBC: 0 % (ref 0.0–0.2)

## 2021-01-11 LAB — MAGNESIUM: Magnesium: 1.6 mg/dL — ABNORMAL LOW (ref 1.7–2.4)

## 2021-01-12 LAB — MAGNESIUM: Magnesium: 2 mg/dL (ref 1.7–2.4)

## 2021-01-12 LAB — POTASSIUM: Potassium: 3.1 mmol/L — ABNORMAL LOW (ref 3.5–5.1)

## 2021-01-13 LAB — BASIC METABOLIC PANEL
Anion gap: 8 (ref 5–15)
BUN: 21 mg/dL (ref 8–23)
CO2: 26 mmol/L (ref 22–32)
Calcium: 8.7 mg/dL — ABNORMAL LOW (ref 8.9–10.3)
Chloride: 107 mmol/L (ref 98–111)
Creatinine, Ser: 1.04 mg/dL (ref 0.61–1.24)
GFR, Estimated: >60 mL/min (ref >60)
Glucose, Bld: 107 mg/dL — ABNORMAL HIGH (ref 70–99)
Potassium: 3.4 mmol/L — ABNORMAL LOW (ref 3.5–5.1)
Sodium: 141 mmol/L (ref 135–145)

## 2021-01-14 ENCOUNTER — Other Ambulatory Visit (HOSPITAL_COMMUNITY): Payer: Medicare Other

## 2021-01-14 LAB — CBC
HCT: 28.8 % — ABNORMAL LOW (ref 39.0–52.0)
Hemoglobin: 9.1 g/dL — ABNORMAL LOW (ref 13.0–17.0)
MCH: 29.8 pg (ref 26.0–34.0)
MCHC: 31.6 g/dL (ref 30.0–36.0)
MCV: 94.4 fL (ref 80.0–100.0)
Platelets: 503 10*3/uL — ABNORMAL HIGH (ref 150–400)
RBC: 3.05 MIL/uL — ABNORMAL LOW (ref 4.22–5.81)
RDW: 16.3 % — ABNORMAL HIGH (ref 11.5–15.5)
WBC: 23.8 10*3/uL — ABNORMAL HIGH (ref 4.0–10.5)
nRBC: 0 % (ref 0.0–0.2)

## 2021-01-14 LAB — BASIC METABOLIC PANEL
Anion gap: 8 (ref 5–15)
BUN: 19 mg/dL (ref 8–23)
CO2: 25 mmol/L (ref 22–32)
Calcium: 8.7 mg/dL — ABNORMAL LOW (ref 8.9–10.3)
Chloride: 108 mmol/L (ref 98–111)
Creatinine, Ser: 1.24 mg/dL (ref 0.61–1.24)
GFR, Estimated: >60 mL/min (ref >60)
Glucose, Bld: 117 mg/dL — ABNORMAL HIGH (ref 70–99)
Potassium: 3.9 mmol/L (ref 3.5–5.1)
Sodium: 141 mmol/L (ref 135–145)

## 2021-01-14 LAB — PHOSPHORUS: Phosphorus: 3.4 mg/dL (ref 2.5–4.6)

## 2021-01-14 LAB — MAGNESIUM: Magnesium: 1.8 mg/dL (ref 1.7–2.4)

## 2021-01-15 ENCOUNTER — Other Ambulatory Visit (HOSPITAL_COMMUNITY): Payer: Medicare Other

## 2021-01-15 HISTORY — PX: IR THORACENTESIS ASP PLEURAL SPACE W/IMG GUIDE: IMG5380

## 2021-01-15 LAB — BODY FLUID CELL COUNT WITH DIFFERENTIAL
Eos, Fluid: 0 %
Lymphs, Fluid: 35 %
Monocyte-Macrophage-Serous Fluid: 16 % — ABNORMAL LOW (ref 50–90)
Neutrophil Count, Fluid: 49 % — ABNORMAL HIGH (ref 0–25)
Total Nucleated Cell Count, Fluid: 480 cu mm (ref 0–1000)

## 2021-01-15 LAB — GLUCOSE, PLEURAL OR PERITONEAL FLUID: Glucose, Fluid: 157 mg/dL

## 2021-01-15 MED ORDER — LIDOCAINE HCL 1 % IJ SOLN
INTRAMUSCULAR | Status: AC
  Filled 2021-01-15: qty 20

## 2021-01-15 MED ORDER — LIDOCAINE HCL (PF) 1 % IJ SOLN
INTRAMUSCULAR | Status: DC | PRN
  Administered 2021-01-15: 10 mL

## 2021-01-15 NOTE — Procedures (Signed)
PROCEDURE SUMMARY:  Successful image-guided left thoracentesis. Yielded 300 cc of hazy yellow fluid. Pt tolerated procedure well. No immediate complications. EBL = trace   Specimen was  sent for labs. CXR ordered.  Please see imaging section of Epic for full dictation.  Armando Gang Damere Brandenburg PA-C 01/15/2021 3:26 PM

## 2021-01-16 LAB — RENAL FUNCTION PANEL
Albumin: <1.5 g/dL — ABNORMAL LOW (ref 3.5–5.0)
Anion gap: 11 (ref 5–15)
BUN: 24 mg/dL — ABNORMAL HIGH (ref 8–23)
CO2: 21 mmol/L — ABNORMAL LOW (ref 22–32)
Calcium: 8.5 mg/dL — ABNORMAL LOW (ref 8.9–10.3)
Chloride: 109 mmol/L (ref 98–111)
Creatinine, Ser: 1.19 mg/dL (ref 0.61–1.24)
GFR, Estimated: >60 mL/min (ref >60)
Glucose, Bld: 121 mg/dL — ABNORMAL HIGH (ref 70–99)
Phosphorus: 2.9 mg/dL (ref 2.5–4.6)
Potassium: 3.1 mmol/L — ABNORMAL LOW (ref 3.5–5.1)
Sodium: 141 mmol/L (ref 135–145)

## 2021-01-16 LAB — CBC
HCT: 23.5 % — ABNORMAL LOW (ref 39.0–52.0)
Hemoglobin: 7.4 g/dL — ABNORMAL LOW (ref 13.0–17.0)
MCH: 29.8 pg (ref 26.0–34.0)
MCHC: 31.5 g/dL (ref 30.0–36.0)
MCV: 94.8 fL (ref 80.0–100.0)
Platelets: 451 10*3/uL — ABNORMAL HIGH (ref 150–400)
RBC: 2.48 MIL/uL — ABNORMAL LOW (ref 4.22–5.81)
RDW: 16.2 % — ABNORMAL HIGH (ref 11.5–15.5)
WBC: 17.3 10*3/uL — ABNORMAL HIGH (ref 4.0–10.5)
nRBC: 0 % (ref 0.0–0.2)

## 2021-01-16 LAB — GRAM STAIN

## 2021-01-16 LAB — MAGNESIUM: Magnesium: 1.7 mg/dL (ref 1.7–2.4)

## 2021-01-17 ENCOUNTER — Other Ambulatory Visit (HOSPITAL_COMMUNITY): Payer: Medicare Other

## 2021-01-17 HISTORY — PX: IR THORACENTESIS ASP PLEURAL SPACE W/IMG GUIDE: IMG5380

## 2021-01-17 LAB — RENAL FUNCTION PANEL
Albumin: <1.5 g/dL — ABNORMAL LOW (ref 3.5–5.0)
Anion gap: 11 (ref 5–15)
BUN: 23 mg/dL (ref 8–23)
CO2: 22 mmol/L (ref 22–32)
Calcium: 8.3 mg/dL — ABNORMAL LOW (ref 8.9–10.3)
Chloride: 107 mmol/L (ref 98–111)
Creatinine, Ser: 1.17 mg/dL (ref 0.61–1.24)
GFR, Estimated: >60 mL/min (ref >60)
Glucose, Bld: 110 mg/dL — ABNORMAL HIGH (ref 70–99)
Phosphorus: 2.7 mg/dL (ref 2.5–4.6)
Potassium: 2.5 mmol/L — CL (ref 3.5–5.1)
Sodium: 140 mmol/L (ref 135–145)

## 2021-01-17 LAB — CBC
HCT: 23 % — ABNORMAL LOW (ref 39.0–52.0)
Hemoglobin: 7 g/dL — ABNORMAL LOW (ref 13.0–17.0)
MCH: 29 pg (ref 26.0–34.0)
MCHC: 30.4 g/dL (ref 30.0–36.0)
MCV: 95.4 fL (ref 80.0–100.0)
Platelets: 461 10*3/uL — ABNORMAL HIGH (ref 150–400)
RBC: 2.41 MIL/uL — ABNORMAL LOW (ref 4.22–5.81)
RDW: 16.2 % — ABNORMAL HIGH (ref 11.5–15.5)
WBC: 17.2 10*3/uL — ABNORMAL HIGH (ref 4.0–10.5)
nRBC: 0 % (ref 0.0–0.2)

## 2021-01-17 LAB — GRAM STAIN

## 2021-01-17 LAB — BODY FLUID CELL COUNT WITH DIFFERENTIAL
Lymphs, Fluid: 52 %
Monocyte-Macrophage-Serous Fluid: 25 % — ABNORMAL LOW (ref 50–90)
Neutrophil Count, Fluid: 23 % (ref 0–25)
Total Nucleated Cell Count, Fluid: 340 cu mm (ref 0–1000)

## 2021-01-17 LAB — MAGNESIUM: Magnesium: 1.6 mg/dL — ABNORMAL LOW (ref 1.7–2.4)

## 2021-01-17 LAB — GLUCOSE, PLEURAL OR PERITONEAL FLUID: Glucose, Fluid: 120 mg/dL

## 2021-01-17 MED ORDER — LIDOCAINE HCL 1 % IJ SOLN
INTRAMUSCULAR | Status: AC
  Administered 2021-01-17: 10 mL
  Filled 2021-01-17: qty 20

## 2021-01-17 NOTE — Procedures (Signed)
Ultrasound-guided diagnostic and therapeutic thoracentesis performed yielding 200 milliliters of straw  colored fluid. No immediate complications.   Diagnostic fluid was sent to the lab for further analysis. Follow-up chest x-ray pending. EBL is < 2 ml.

## 2021-01-18 LAB — BASIC METABOLIC PANEL
Anion gap: 8 (ref 5–15)
BUN: 17 mg/dL (ref 8–23)
CO2: 23 mmol/L (ref 22–32)
Calcium: 8.2 mg/dL — ABNORMAL LOW (ref 8.9–10.3)
Chloride: 106 mmol/L (ref 98–111)
Creatinine, Ser: 1.02 mg/dL (ref 0.61–1.24)
GFR, Estimated: >60 mL/min (ref >60)
Glucose, Bld: 102 mg/dL — ABNORMAL HIGH (ref 70–99)
Potassium: 3.2 mmol/L — ABNORMAL LOW (ref 3.5–5.1)
Sodium: 137 mmol/L (ref 135–145)

## 2021-01-18 LAB — MAGNESIUM: Magnesium: 1.7 mg/dL (ref 1.7–2.4)

## 2021-01-18 LAB — PHOSPHORUS: Phosphorus: 2.4 mg/dL — ABNORMAL LOW (ref 2.5–4.6)

## 2021-01-19 LAB — CBC
HCT: 22.5 % — ABNORMAL LOW (ref 39.0–52.0)
Hemoglobin: 6.9 g/dL — CL (ref 13.0–17.0)
MCH: 29.1 pg (ref 26.0–34.0)
MCHC: 30.7 g/dL (ref 30.0–36.0)
MCV: 94.9 fL (ref 80.0–100.0)
Platelets: 424 10*3/uL — ABNORMAL HIGH (ref 150–400)
RBC: 2.37 MIL/uL — ABNORMAL LOW (ref 4.22–5.81)
RDW: 16.2 % — ABNORMAL HIGH (ref 11.5–15.5)
WBC: 15.1 10*3/uL — ABNORMAL HIGH (ref 4.0–10.5)
nRBC: 0 % (ref 0.0–0.2)

## 2021-01-19 LAB — BASIC METABOLIC PANEL
Anion gap: 7 (ref 5–15)
BUN: 14 mg/dL (ref 8–23)
CO2: 22 mmol/L (ref 22–32)
Calcium: 8.3 mg/dL — ABNORMAL LOW (ref 8.9–10.3)
Chloride: 106 mmol/L (ref 98–111)
Creatinine, Ser: 0.88 mg/dL (ref 0.61–1.24)
GFR, Estimated: >60 mL/min (ref >60)
Glucose, Bld: 91 mg/dL (ref 70–99)
Potassium: 3.7 mmol/L (ref 3.5–5.1)
Sodium: 135 mmol/L (ref 135–145)

## 2021-01-19 LAB — CYTOLOGY - NON PAP

## 2021-01-19 LAB — PHOSPHORUS: Phosphorus: 3.1 mg/dL (ref 2.5–4.6)

## 2021-01-19 LAB — MAGNESIUM: Magnesium: 1.5 mg/dL — ABNORMAL LOW (ref 1.7–2.4)

## 2021-01-19 NOTE — Progress Notes (Signed)
PROGRESS NOTE    Andre Glass  ZOX:[id number] DOB: 1950/07/12 DOA: 12/20/2020  Brief Narrative:  Andre Glass is an 70 y.o. male with medical history significant for metastatic prostate cancer, chronic bilateral nephrostomies due to obstructive uropathy from urinary cancer who was admitted to acute facility with right-sided abdominal pain.  Initial CT scan did not show evidence of obstructive pathology. He was seen by GI and general surgery and they recommended to continue bowel regimen and continued observation.  CT also showed bilateral DVTs.  He was started on anticoagulation.  He was receiving enemas and unfortunately developed perforation of the colon with peritonitis.  He was taken to the OR and underwent laparotomy, partial colectomy and colostomy.  Biopsies from the surgery confirmed metastatic prostate cancer.  There is also concern for underlying abscess and he had a JP drain in place.  His cultures came back positive for Pseudomonas, E. coli. He received treatment with IV Zosyn.  He was seen by oncology and palliative care.  However, family wanted to continue treatment in hopes for improvement therefore to [facility].  After admission here he is having fevers.  CT of the abdomen pelvis on 12/31/2020 showed moderate bilateral pleural effusions, lower lobe atelectasis, improving abscess along the posterior right hepatic margin, pericapsular splenic abscess 8.6 x 4.7 cm, postoperative changes of left hemicolectomy with left lower quadrant colostomy, retroperitoneal adenopathy, pelvic lymphadenopathy with pelvic soft tissue implant.  He was also noted to have multifocal sclerotic metastasis at T12, L2, L4, sacrum and bilateral inferior pubic rami. Also noted was moderate left hydroureteronephrosis mildly progressive, thick-walled bladder with suspected bladder hemorrhage. He has been on treatment with IV Zosyn.  However, has been having leukocytosis with fever.  He had pleural effusion and  underwent ultrasound-guided thoracentesis.  Repeat CT of the abdomen and pelvis on 01/14/2021 continue to show residual abscesses with some improvement in multiple intra-abdominal abscesses.   Assessment & Plan:  Bowel perforation with peritonitis Status post exploratory laparotomy with colectomy and end colostomy Intra-abdominal and perihepatic abscess with Pseudomonas, E. Coli Pericapsular splenic abscess Systemic inflammatory response syndrome Fever/leukocytosis Postoperative abdominal wound Metastatic prostate cancer Bilateral hydronephrosis status post nephrostomy tubes Bilateral pleural effusion Bilateral lower extremity DVT Severe protein calorie malnutrition/dysphagia Sacral pressure ulcer unstageable   Bowel perforation with peritonitis: He is status post expiratory laparotomy/Hartman partial colectomy with end colostomy on 11/28/2020.  He received treatment with IV Zosyn in the acute facility.  Recommend to continue treatment with Zosyn.  Since he is having fevers he was treated with IV vancomycin for gram-positive coverage.  Initial plan was to treat for duration of 2 weeks.  However, recent CT continuing to show the intra-abdominal abscesses with some improvement.  Recommend to extend antibiotics and treat for tentative duration of 01/25/2021 any improvement. Please monitor BUN/cr closely while on antibiotics.  He has a postoperative abdominal wound.  Continue local wound care.   Intra-abdominal/perihepatic/pericapsular splenic abscess: Cultures at the acute facility showed Pseudomonas, E. coli.  Continue treatment with IV Zosyn.  He is also having fevers and leukocytosis with repeat CT continuing to show the intra-abdominal abscesses with some improvement.  Therefore we will plan to extend antibiotics with a tentative end date 01/25/2021 pending improvement.  He will need repeat CT of the abdomen and pelvis to evaluate for resolution of the abscesses.  However, he has metastatic  prostate cancer with multiple mets therefore we may be unable to completely treat the abscess due to ongoing underlying condition.  Systemic inflammatory response syndrome: He started having fever with leukocytosis.  He was also treated with IV vancomycin in addition to the Zosyn for gram-positive coverage.  Antibiotics and plan as mentioned above.  Recommend to monitor CBC, BUN/creatinine closely while on antibiotics.  If he continues to have fevers recommend to send repeat for pancultures including blood cultures.   Bilateral hydronephrosis: He is status post nephrostomy tubes.  He is high risk for UTI/sepsis due to this.  Already on antibiotics as mentioned above.  Further management per primary team.   Bilateral lower extremity DVT: He was on heparin drip.  No anticoagulation due to hematuria, low hemoglobin and FOBT positive.  Further management per the primary team.  Bilateral pleural effusion: Status postthoracentesis.  Further management per primary team.   Severe protein calorie malnutrition/dysphagia: Management of protein calorie malnutrition per the primary team.  Due to his dysphagia he is high risk for aspiration and recurrent aspiration pneumonia despite being on antibiotics.   Sacral pressure ulcer unstageable: Continue local wound care.  Already on antibiotics as mentioned above.  If the pressure ulcer is worsening consider consulting surgery to evaluate for possible debridement.  Unfortunately due to his debility and inability to offload he is high risk for further worsening of the pressure ulcer.   Metastatic prostate cancer: Per imaging he was noted to have multiple months to T12, L2, L4, sacrum, bilateral pubic rami.  Overall poor prognosis.  He was already seen by palliative care at the acute facility with the family wanted to continue current care.   Unfortunately due to his metastatic prostate cancer, multiple complex medical problems he is very high risk for worsening and  decompensation.  Overall poor prognosis.  Recommend to discuss goals of care.   Plan of care discussed with patient, primary team.  Subjective: He is continuing to have fevers with leukocytosis.  Recent CT of the abdomen and pelvis on 01/14/2021 continues to show the abscesses in the abdomen.  Objective: Vitals: Temperature 97.3, pulse 74, respiratory rate 17, blood pressure 130/76, oxygen saturation 100%  Examination: Constitutional: Awake, not in any acute distress Head: Atraumatic, normocephalic Eyes: PERLA, EOMI, irises appear normal, anicteric sclera,  ENMT: external ears and nose appear normal, normal hearing, lips appears normal, moist oral mucosa Neck: Supple, trachea midline CVS: S1-S2  Respiratory: Decreased breath sounds lower lobes, crackles, no rhonchi, no wheezing Abdomen: soft nontender, nondistended, normal bowel sounds, midline abdominal wound, bilateral nephrostomy tube draining, JP drain in place Musculoskeletal: Lower extremity edema Neuro: Awake, debilitated neurolysed weakness otherwise grossly nonfocal Psych: stable mood and affect, mental status Skin: Midline abdominal wound, unstageable sacrococcygeal pressure ulcer   Data Reviewed: I have personally reviewed following labs and imaging studies  CBC: Recent Labs  Lab 01/14/21 0422 01/16/21 0626 01/17/21 1050 01/19/21 0500  WBC 23.8* 17.3* 17.2* 15.1*  HGB 9.1* 7.4* 7.0* 6.9*  HCT 28.8* 23.5* 23.0* 22.5*  MCV 94.4 94.8 95.4 94.9  PLT 503* 451* 461* 424*    Basic Metabolic Panel: Recent Labs  Lab 01/14/21 0422 01/16/21 0626 01/17/21 1050 01/18/21 0600 01/19/21 0500  NA 141 141 140 137 135  K 3.9 3.1* 2.5* 3.2* 3.7  CL 108 109 107 106 106  CO2 25 21* 22 23 22   GLUCOSE 117* 121* 110* 102* 91  BUN 19 24* 23 17 14   CREATININE 1.24 1.19 1.17 1.02 0.88  CALCIUM 8.7* 8.5* 8.3* 8.2* 8.3*  MG 1.8 1.7 1.6* 1.7 1.5*  PHOS 3.4 2.9 2.7 2.4* 3.1  GFR: CrCl cannot be calculated (Unknown ideal  weight.).  Liver Function Tests: Recent Labs  Lab 01/16/21 0626 01/17/21 1050  ALBUMIN <1.5* <1.5*    CBG: No results for input(s): GLUCAP in the last 168 hours.   Recent Results (from the past 240 hour(s))  Gram stain     Status: None   Collection Time: 01/15/21  3:29 PM   Specimen: Lung, Left; Pleural Fluid  Result Value Ref Range Status   Specimen Description PLEURAL FLUID  Final   Special Requests LEFT THORACENTESIS SPEC A  Final   Gram Stain   Final    WBC PRESENT,BOTH PMN AND MONONUCLEAR NO ORGANISMS SEEN CYTOSPIN SMEAR Performed at [facility], [address], [place], [place] [address]    Report Status 01/16/2021 FINAL  Final  Culture, body fluid w Gram Stain-bottle     Status: None (Preliminary result)   Collection Time: 01/15/21  3:29 PM   Specimen: Pleura  Result Value Ref Range Status   Specimen Description PLEURAL FLUID  Final   Special Requests LEFT THORACENTESIS SPEC A  Final   Culture   Final    NO GROWTH 4 DAYS Performed at [facility] [address], [place], [place] [address]    Report Status PENDING  Incomplete  Gram stain     Status: None   Collection Time: 01/17/21  1:29 PM   Specimen: Lung, Right; Pleural Fluid  Result Value Ref Range Status   Specimen Description PLEURAL FLUID  Final   Special Requests RIGHT THORACENTESIS SPEC A  Final   Gram Stain   Final    WBC PRESENT,BOTH PMN AND MONONUCLEAR NO ORGANISMS SEEN CYTOSPIN SMEAR Performed at [facility], [address] [place], [place] [address]    Report Status 01/17/2021 FINAL  Final  Culture, body fluid w Gram Stain-bottle     Status: None (Preliminary result)   Collection Time: 01/17/21  1:29 PM   Specimen: Pleura  Result Value Ref Range Status   Specimen Description PLEURAL FLUID  Final   Special Requests RIGHT THORACENTESIS SPEC A  Final   Culture   Final    NO GROWTH 2 DAYS Performed at [facility] [address] [place], [place] [address]     Report Status PENDING  Incomplete     Radiology Studies: No results found.  Scheduled Meds: Please see MAR    Yaakov Guthrie, MD  01/19/2021, 7:09 PM

## 2021-01-20 LAB — BPAM RBC
Blood Product Expiration Date: 202211022359
ISSUE DATE / TIME: 202210161310
Unit Type and Rh: 6200

## 2021-01-20 LAB — CULTURE, BODY FLUID W GRAM STAIN -BOTTLE: Culture: NO GROWTH

## 2021-01-20 LAB — BASIC METABOLIC PANEL
Anion gap: 7 (ref 5–15)
BUN: 11 mg/dL (ref 8–23)
CO2: 23 mmol/L (ref 22–32)
Calcium: 8.3 mg/dL — ABNORMAL LOW (ref 8.9–10.3)
Chloride: 104 mmol/L (ref 98–111)
Creatinine, Ser: 0.81 mg/dL (ref 0.61–1.24)
GFR, Estimated: >60 mL/min (ref >60)
Glucose, Bld: 104 mg/dL — ABNORMAL HIGH (ref 70–99)
Potassium: 3.7 mmol/L (ref 3.5–5.1)
Sodium: 134 mmol/L — ABNORMAL LOW (ref 135–145)

## 2021-01-20 LAB — CBC
HCT: 27.3 % — ABNORMAL LOW (ref 39.0–52.0)
Hemoglobin: 8.8 g/dL — ABNORMAL LOW (ref 13.0–17.0)
MCH: 29.3 pg (ref 26.0–34.0)
MCHC: 32.2 g/dL (ref 30.0–36.0)
MCV: 91 fL (ref 80.0–100.0)
Platelets: 450 10*3/uL — ABNORMAL HIGH (ref 150–400)
RBC: 3 MIL/uL — ABNORMAL LOW (ref 4.22–5.81)
RDW: 16.5 % — ABNORMAL HIGH (ref 11.5–15.5)
WBC: 16.5 10*3/uL — ABNORMAL HIGH (ref 4.0–10.5)
nRBC: 0 % (ref 0.0–0.2)

## 2021-01-20 LAB — TYPE AND SCREEN
ABO/RH(D): A POS
Antibody Screen: NEGATIVE
Unit division: 0

## 2021-01-20 LAB — MAGNESIUM: Magnesium: 1.7 mg/dL (ref 1.7–2.4)

## 2021-01-20 LAB — PREPARE RBC (CROSSMATCH)

## 2021-01-20 LAB — CYTOLOGY - NON PAP

## 2021-01-20 LAB — PHOSPHORUS: Phosphorus: 2.8 mg/dL (ref 2.5–4.6)

## 2021-01-21 LAB — MAGNESIUM: Magnesium: 1.8 mg/dL (ref 1.7–2.4)

## 2021-01-22 LAB — CBC
HCT: 27.3 % — ABNORMAL LOW (ref 39.0–52.0)
Hemoglobin: 8.8 g/dL — ABNORMAL LOW (ref 13.0–17.0)
MCH: 29.3 pg (ref 26.0–34.0)
MCHC: 32.2 g/dL (ref 30.0–36.0)
MCV: 91 fL (ref 80.0–100.0)
Platelets: 415 10*3/uL — ABNORMAL HIGH (ref 150–400)
RBC: 3 MIL/uL — ABNORMAL LOW (ref 4.22–5.81)
RDW: 15.8 % — ABNORMAL HIGH (ref 11.5–15.5)
WBC: 12.3 10*3/uL — ABNORMAL HIGH (ref 4.0–10.5)
nRBC: 0 % (ref 0.0–0.2)

## 2021-01-22 LAB — CULTURE, BODY FLUID W GRAM STAIN -BOTTLE: Culture: NO GROWTH

## 2021-01-22 NOTE — Progress Notes (Signed)
PROGRESS NOTE    [name]  CZY:[id number] DOB: 17-Feb-1951 DOA: 12/20/2020  Brief Narrative:  Andre Glass is an 70 y.o. male with medical history significant for metastatic prostate cancer, chronic bilateral nephrostomies due to obstructive uropathy from urinary cancer who was admitted to acute facility with right-sided abdominal pain.  Initial CT scan did not show evidence of obstructive pathology. He was seen by GI and general surgery and they recommended to continue bowel regimen and continued observation.  CT also showed bilateral DVTs.  He was started on anticoagulation.  He was receiving enemas and unfortunately developed perforation of the colon with peritonitis.  He was taken to the OR and underwent laparotomy, partial colectomy and colostomy.  Biopsies from the surgery confirmed metastatic prostate cancer.  There is also concern for underlying abscess and he had a JP drain in place.  His cultures came back positive for Pseudomonas, E. coli. He received treatment with IV Zosyn.  He was seen by oncology and palliative care.  However, family wanted to continue treatment in hopes for improvement therefore to [facility].  After admission here he is having fevers.  CT of the abdomen pelvis on 12/31/2020 showed moderate bilateral pleural effusions, lower lobe atelectasis, improving abscess along the posterior right hepatic margin, pericapsular splenic abscess 8.6 x 4.7 cm, postoperative changes of left hemicolectomy with left lower quadrant colostomy, retroperitoneal adenopathy, pelvic lymphadenopathy with pelvic soft tissue implant.  He was also noted to have multifocal sclerotic metastasis at T12, L2, L4, sacrum and bilateral inferior pubic rami. Also noted was moderate left hydroureteronephrosis mildly progressive, thick-walled bladder with suspected bladder hemorrhage. He has been on treatment with IV Zosyn.  However, has been having leukocytosis with fever.  He had pleural effusion and  underwent ultrasound-guided thoracentesis.  Repeat CT of the abdomen and pelvis on 01/14/2021 showing residual abscesses with some improvement in multiple intra-abdominal abscesses.  He is continuing to have leukocytosis.  Assessment & Plan:  Bowel perforation with peritonitis Status post exploratory laparotomy with colectomy and end colostomy Intra-abdominal and perihepatic abscess with Pseudomonas, E. Coli Pericapsular splenic abscess Systemic inflammatory response syndrome Fever/leukocytosis Postoperative abdominal wound Metastatic prostate cancer Bilateral hydronephrosis status post nephrostomy tubes Bilateral pleural effusion Bilateral lower extremity DVT Severe protein calorie malnutrition/dysphagia Sacral pressure ulcer unstageable   Bowel perforation with peritonitis: He is status post expiratory laparotomy/Hartman partial colectomy with end colostomy on 11/28/2020.  He received treatment with IV Zosyn in the acute facility.  Recommend to continue treatment with Zosyn.  Since he is having fevers he was treated with IV vancomycin for gram-positive coverage.  Initial plan was to treat for duration of 2 weeks.  However, recent CT continuing to show the intra-abdominal abscesses with some improvement.  Discharge planning in progress per the primary team.  Due to the ongoing residual abscess would recommend to switch to ciprofloxacin, Flagyl and monitor his WBC count closely.  Tentative plan for the antibiotics until 02/02/2021.  Would recommend repeat CT of the abdomen and pelvis prior to stopping antibiotics.  Please monitor BUN/cr closely while on antibiotics.  He has a postoperative abdominal wound.  Continue local wound care.   Intra-abdominal/perihepatic/pericapsular splenic abscess: Cultures at the acute facility showed Pseudomonas, E. coli.  He has been on treatment with IV Zosyn.  He is also having fevers and leukocytosis with repeat CT continuing to show the intra-abdominal abscesses  with some improvement.  Recommend to switch antibiotics to ciprofloxacin and Flagyl for discharge planning with tentative  end date 02/02/2021.  He will need repeat CT of the abdomen and pelvis to evaluate for resolution of the abscesses.  However, he has metastatic prostate cancer with multiple mets therefore we may be unable to completely treat the abscess due to ongoing underlying condition.   Systemic inflammatory response syndrome: He started having fever with leukocytosis.  He was also treated with IV vancomycin in addition to the Zosyn for gram-positive coverage.  Antibiotics and plan as mentioned above.  Recommend to monitor CBC, BUN/creatinine closely while on antibiotics.  If he continues to have fevers recommend to send repeat for pancultures including blood cultures and repeat imaging.   Bilateral hydronephrosis: He is status post nephrostomy tubes.  He is high risk for UTI/sepsis due to this.  Already on antibiotics as mentioned above.  Further management per primary team.   Bilateral lower extremity DVT: Further management per the primary team.  Bilateral pleural effusion: Status post thoracentesis.  Further management per primary team.   Severe protein calorie malnutrition/dysphagia: Management of protein calorie malnutrition per the primary team.  Due to his dysphagia he is high risk for aspiration and recurrent aspiration pneumonia despite being on antibiotics.   Sacral pressure ulcer unstageable: Continue local wound care.  Already on antibiotics as mentioned above.  If the pressure ulcer is worsening consider consulting surgery to evaluate for possible debridement.  Unfortunately due to his debility and inability to offload he is high risk for further worsening of the pressure ulcer.   Metastatic prostate cancer: Per imaging he was noted to have multiple months to T12, L2, L4, sacrum, bilateral pubic rami.  Overall poor prognosis.  He was already seen by palliative care at the acute  facility with the family wanted to continue current care.   Unfortunately due to his metastatic prostate cancer, multiple complex medical problems he is very high risk for worsening and decompensation.  Overall poor prognosis.  Recommend to discuss goals of care.   Plan of care discussed with patient, primary team.  Subjective: He is continuing to have leukocytosis. CT of the abdomen and pelvis on 01/14/2021 continues to show the abscesses in the abdomen.  Objective: Vitals: Temperature 97.6, pulse 82, respiratory rate 18, blood pressure 136/76, oxygen saturation 98% Examination: Constitutional: Awake, not in any acute distress Head: Atraumatic, normocephalic Eyes: PERLA, EOMI, irises appear normal, anicteric sclera,  ENMT: external ears and nose appear normal, normal hearing, lips appears normal, moist oral mucosa Neck: Supple, trachea midline CVS: S1-S2  Respiratory: Decreased breath sounds lower lobes, no rhonchi, no wheezing Abdomen: soft nontender, nondistended, normal bowel sounds, midline abdominal wound, bilateral nephrostomy tube draining, JP drain in place Musculoskeletal: Lower extremity edema Neuro: Awake, debilitated neurolysed weakness otherwise grossly nonfocal Psych: stable mood and affect, mental status Skin: Midline abdominal wound, unstageable sacrococcygeal pressure ulcer   Data Reviewed: I have personally reviewed following labs and imaging studies  CBC: Recent Labs  Lab 01/16/21 0626 01/17/21 1050 01/19/21 0500 01/20/21 0455 01/22/21 0510  WBC 17.3* 17.2* 15.1* 16.5* 12.3*  HGB 7.4* 7.0* 6.9* 8.8* 8.8*  HCT 23.5* 23.0* 22.5* 27.3* 27.3*  MCV 94.8 95.4 94.9 91.0 91.0  PLT 451* 461* 424* 450* 415*     Basic Metabolic Panel: Recent Labs  Lab 01/16/21 0626 01/17/21 1050 01/18/21 0600 01/19/21 0500 01/20/21 0455 01/21/21 0535  NA 141 140 137 135 134*  --   K 3.1* 2.5* 3.2* 3.7 3.7  --   CL 109 107 106 106 104  --  CO2 21* 22 23 22 23   --    GLUCOSE 121* 110* 102* 91 104*  --   BUN 24* 23 17 14 11   --   CREATININE 1.19 1.17 1.02 0.88 0.81  --   CALCIUM 8.5* 8.3* 8.2* 8.3* 8.3*  --   MG 1.7 1.6* 1.7 1.5* 1.7 1.8  PHOS 2.9 2.7 2.4* 3.1 2.8  --      GFR: CrCl cannot be calculated (Unknown ideal weight.).  Liver Function Tests: Recent Labs  Lab 01/16/21 0626 01/17/21 1050  ALBUMIN <1.5* <1.5*     CBG: No results for input(s): GLUCAP in the last 168 hours.   Recent Results (from the past 240 hour(s))  Gram stain     Status: None   Collection Time: 01/15/21  3:29 PM   Specimen: Lung, Left; Pleural Fluid  Result Value Ref Range Status   Specimen Description PLEURAL FLUID  Final   Special Requests LEFT THORACENTESIS SPEC A  Final   Gram Stain   Final    WBC PRESENT,BOTH PMN AND MONONUCLEAR NO ORGANISMS SEEN CYTOSPIN SMEAR Performed at [facility], [address] [place], [place] [address]    Report Status 01/16/2021 FINAL  Final  Culture, body fluid w Gram Stain-bottle     Status: None   Collection Time: 01/15/21  3:29 PM   Specimen: Pleura  Result Value Ref Range Status   Specimen Description PLEURAL FLUID  Final   Special Requests LEFT THORACENTESIS SPEC A  Final   Culture   Final    NO GROWTH 5 DAYS Performed at [facility] [address], [place], [place] [address]    Report Status 01/20/2021 FINAL  Final  Gram stain     Status: None   Collection Time: 01/17/21  1:29 PM   Specimen: Lung, Right; Pleural Fluid  Result Value Ref Range Status   Specimen Description PLEURAL FLUID  Final   Special Requests RIGHT THORACENTESIS SPEC A  Final   Gram Stain   Final    WBC PRESENT,BOTH PMN AND MONONUCLEAR NO ORGANISMS SEEN CYTOSPIN SMEAR Performed at [facility], [address], [place], [place] [address]    Report Status 01/17/2021 FINAL  Final  Culture, body fluid w Gram Stain-bottle     Status: None   Collection Time: 01/17/21  1:29 PM   Specimen: Pleura  Result Value Ref  Range Status   Specimen Description PLEURAL FLUID  Final   Special Requests RIGHT THORACENTESIS SPEC A  Final   Culture   Final    NO GROWTH 5 DAYS Performed at [facility] [address] [place], [place] [address]    Report Status 01/22/2021 FINAL  Final      Radiology Studies: No results found.  Scheduled Meds: Please see MAR  Yaakov Guthrie, MD  01/22/2021, 3:49 PM

## 2021-01-24 ENCOUNTER — Telehealth: Payer: Self-pay | Admitting: *Deleted

## 2021-01-24 ENCOUNTER — Other Ambulatory Visit: Payer: Self-pay | Admitting: *Deleted

## 2021-01-24 NOTE — Telephone Encounter (Signed)
-----   Message from [name], MD sent at 01/24/2021 12:21 PM EDT ----- Will arrange for follow up and determine if he is able to get treatment. Thanks ----- Message ----- From: Rolene Course, RN Sent: 01/24/2021  11:53 AM EDT To: [name], MD  FYI I received a call from Mr Shankel's caregiver, he is home now & is interested in starting treatment.  Please advise.  Thanks, Bethena Roys

## 2021-01-24 NOTE — Telephone Encounter (Signed)
PC to [facility] & patient, advised them Dr. [name] wants to see patient again before beginning treatment, scheduling will contact them to set up appointment.  Patient verbalizes understanding.

## 2021-01-25 ENCOUNTER — Telehealth: Payer: Self-pay | Admitting: Family Medicine

## 2021-01-25 NOTE — Telephone Encounter (Signed)
**  After Hours/ Emergency Line Call**  Received a call to report that Cathrine Muster was having abdominal pain. Call was made by friend [name] who reports that patient was discharged yesterday with colostomy and they were unsure if he should be having a bowel movement. Reports that there is small amount of stool coming from ostomy. She was unable to give specifics about pain and requested to call [facility] at [phone]. Called and said number and no answer.  LVM to return call.  Recommended to [name] that she take patient to ED for evaluation for worsening abdominal pain.  Red flags discussed.  Will forward to PCP.  Carollee Leitz MD PGY-3, [facility] 01/25/2021 8:38 PM

## 2021-01-27 ENCOUNTER — Telehealth: Payer: Self-pay

## 2021-01-27 NOTE — Telephone Encounter (Signed)
[employer] RN calls nurse line requesting verbal orders for home health nursing as follow.   2x a week for 2 week  1x a week for 6 weeks   Also requesting a medical social work evaluation and speech.   All verbal orders given.

## 2021-01-29 ENCOUNTER — Other Ambulatory Visit: Payer: Self-pay | Admitting: *Deleted

## 2021-01-29 ENCOUNTER — Other Ambulatory Visit: Payer: Self-pay | Admitting: Oncology

## 2021-01-29 MED ORDER — HYDROMORPHONE HCL 4 MG PO TABS: 4.0000 mg | ORAL_TABLET | ORAL | 0 refills | Status: AC | PRN

## 2021-01-30 ENCOUNTER — Other Ambulatory Visit: Payer: Self-pay

## 2021-01-30 ENCOUNTER — Encounter (HOSPITAL_COMMUNITY): Payer: Self-pay

## 2021-01-30 ENCOUNTER — Emergency Department (HOSPITAL_COMMUNITY)
Admission: EM | Admit: 2021-01-30 | Discharge: 2021-01-31 | Disposition: A | Discharge status: Home / Self Care | Payer: Medicare Other | Attending: Emergency Medicine | Admitting: Emergency Medicine

## 2021-01-30 DIAGNOSIS — Z8546 Personal history of malignant neoplasm of prostate: Secondary | ICD-10-CM | POA: Insufficient documentation

## 2021-01-30 DIAGNOSIS — Z7189 Other specified counseling: Secondary | ICD-10-CM

## 2021-01-30 DIAGNOSIS — Z433 Encounter for attention to colostomy: Secondary | ICD-10-CM | POA: Insufficient documentation

## 2021-01-30 NOTE — Discharge Instructions (Signed)
Return for fever, vomiting, severe pain

## 2021-01-30 NOTE — ED Notes (Signed)
Caregiver and brother at the bedside. Colostomy site cleansed and bag changed while demonstrated for them.

## 2021-01-30 NOTE — ED Notes (Signed)
[facility] called for transport for the patient back home. Patient's dispo listed as discharge.

## 2021-01-30 NOTE — ED Notes (Signed)
Clean dressings applied to both nephrostomy sites and abdominal incision. No drainage or foul smell noted. Patient denies pain or discomfort. Patient unable to assist with rolling or to sit up on his own.

## 2021-01-30 NOTE — ED Notes (Signed)
Patients nephew would like a call back: Delfino Lovett [phone]

## 2021-01-30 NOTE — ED Triage Notes (Signed)
"  He has bilateral nephrostomy tubes and a colostomy, brother said patient has been talking slower than normal the last few days and pulling at lines. He pulled his colostomy off and didn't have supplies to put it back on. They got a new one this morning but it is different and his brother doesn't know how to put it back on. Per his wife she states the patient is talking normal at his baseline, but he is refusing to take any of his medications and is not complaining of any pain.  Per the patient he just needs his colostomy bag changed and he didn't take his medications because he just didn't want to take them" Per EMS

## 2021-01-30 NOTE — ED Provider Notes (Signed)
[facility] DEPT Provider Note   CSN: [id number] Arrival date & time: 01/30/21  1159     History Chief Complaint  Patient presents with   GI Problem    "Need colostomy changed" per pt    GERHARD RAPPAPORT is a 70 y.o. male.  Patient is a 71 year old with a history of ostomy and bilateral nephrostomy tubes related to obstructing prostate cancer who reports he has been doing well but he does tend to mess with the tape on his ostomy bag and he pulled the ostomy off.  They sent him a new ostomy however they did not know how to put it on so had to use a makeshift and he came here to get a new ostomy bag.  He reports his nephrostomy tubes continue to drain and he has had no pain, nausea, vomiting or stool changes.  He does report that he does not want to take pain medication because he has not been in pain and he does talk to his dad mother and father at times but he feels that is helping him to not have pain.  His brother did report to EMS that he has been talking slower over the last few days and he was concerned about him however his caregiver [name] reported that he is at his baseline just refusing his medications.  The history is provided by the patient.  GI Problem      Past Medical History:  Diagnosis Date   Anemia    History of kidney stones    Prostate cancer ([place])    UTI (urinary tract infection)     Patient Active Problem List   Diagnosis Date Noted   Intra-abdominal abscess ([place])    Pressure injury of skin 12/16/2020   Stercoral perforation of colon s/p Hartmann colectomy/colostomy 11/28/2020 12/08/2020   Fecal peritonitis ([place]) 12/08/2020   Malnutrition of moderate degree 12/05/2020   Fecal impaction ([place]) 11/27/2020   Hypokalemia 11/27/2020   Hypomagnesemia 11/27/2020   DVT of deep femoral vein, left ([place]) 11/27/2020   Abdominal pain    Severe protein-calorie malnutrition ([place]) 10/21/2020   Acute cystitis without hematuria    Anemia     Bacteremia due to Klebsiella pneumoniae    Altered mental status 10/18/2020   Myoclonus    Symptomatic anemia 10/17/2020   AKI (acute kidney injury) ([place]) 09/28/2020   Hydronephrosis 09/28/2020   Obstructive uropathy 09/28/2020   Intractable pain 01/18/2020   BPH (benign prostatic hyperplasia) 11/08/2019   Catheter-associated urinary tract infection ([place]) 09/14/2019   Constipation 07/18/2019   Goals of care, counseling/discussion 06/26/2019   Metastatic castration-resistant adenocarcinoma of prostate ([place]) 04/19/2019    Past Surgical History:  Procedure Laterality Date   COLOSTOMY N/A 11/28/2020   Procedure: COLOSTOMY;  Surgeon: Clovis Riley, MD;  Location: [facility] ORS;  Service: General;  Laterality: N/A;   IR IMAGING GUIDED PORT INSERTION  01/31/2020   IR NEPHROSTOGRAM LEFT THRU EXISTING ACCESS  11/20/2020   IR NEPHROSTOGRAM RIGHT THRU EXISTING ACCESS  11/20/2020   IR NEPHROSTOMY EXCHANGE RIGHT  10/25/2020   IR NEPHROSTOMY PLACEMENT LEFT  09/29/2020   IR NEPHROSTOMY PLACEMENT LEFT  11/02/2020   IR NEPHROSTOMY PLACEMENT RIGHT  09/29/2020   IR NEPHROSTOMY PLACEMENT RIGHT  11/02/2020   IR THORACENTESIS ASP PLEURAL SPACE W/IMG GUIDE  01/15/2021   IR THORACENTESIS ASP PLEURAL SPACE W/IMG GUIDE  01/17/2021   IR URETERAL STENT PERC EXCHG MOD SED  11/19/2020   IR URETERAL STENT PERC EXCHG MOD  SED  11/19/2020   LAPAROTOMY N/A 11/28/2020   Procedure: EXPLORATORY LAPAROTOMY/ SIGMOID COLECTOMY;  Surgeon: Clovis Riley, MD;  Location: [facility];  Service: General;  Laterality: N/A;   TRANSURETHRAL RESECTION OF PROSTATE N/A 11/08/2019   Procedure: TRANSURETHRAL RESECTION OF THE PROSTATE (TURP);  Surgeon: [name], MD;  Location: [facility];  Service: Urology;  Laterality: N/A;       Family History  Problem Relation Age of Onset   Stomach cancer Mother    Pancreatic cancer Sister    Breast cancer Neg Hx    Prostate cancer Neg Hx    Colon cancer Neg Hx     Social History   Tobacco  Use   Smoking status: Never   Smokeless tobacco: Never  Vaping Use   Vaping Use: Never used  Substance Use Topics   Alcohol use: Never   Drug use: Never    Home Medications Prior to Admission medications   Medication Sig Start Date End Date Taking? Authorizing Provider  acetaminophen (TYLENOL) 325 MG tablet Take 2 tablets (650 mg total) by mouth every 6 (six) hours as needed for headache. 10/30/20   Ezequiel Essex, MD  carvedilol (COREG) 3.125 MG tablet Take 1 tablet (3.125 mg total) by mouth 2 (two) times daily with a meal. 12/20/20   Regalado, Belkys A, MD  feeding supplement (ENSURE ENLIVE / ENSURE PLUS) LIQD Take 237 mLs by mouth 3 (three) times daily between meals. 10/30/20   Ezequiel Essex, MD  HYDROmorphone (DILAUDID) 2 MG tablet Take 2 mg by mouth 4 (four) times daily. 01/25/21   [provider]  HYDROmorphone (DILAUDID) 4 MG tablet Take 1 tablet (4 mg total) by mouth every 4 (four) hours as needed for severe pain. 01/29/21   [name], MD  methocarbamol (ROBAXIN) 500 MG tablet Take 2 tablets (1,000 mg total) by mouth every 8 (eight) hours as needed for muscle spasms. 12/20/20   Regalado, Belkys A, MD  mirtazapine (REMERON) 7.5 MG tablet TAKE 1 TABLET BY MOUTH AT BEDTIME. 11/21/20   Gifford Shave, MD  Multiple Vitamin (MULTIVITAMIN WITH MINERALS) TABS tablet Take 1 tablet by mouth daily.    [provider]  pantoprazole (PROTONIX) 40 MG tablet Take 1 tablet (40 mg total) by mouth daily. 12/21/20   Regalado, Belkys A, MD  piperacillin-tazobactam (ZOSYN) 3.375 GM/50ML IVPB Inject 50 mLs (3.375 g total) into the vein every 8 (eight) hours. 12/20/20   Regalado, Belkys A, MD  pregabalin (LYRICA) 50 MG capsule TAKE 1 CAPSULE BY MOUTH 2 TIMES DAILY. 12/16/20   [name], DO  prochlorperazine (COMPAZINE) 10 MG tablet Take 1 tablet (10 mg total) by mouth every 6 (six) hours as needed for nausea or vomiting. 09/24/20   [name], MD  vitamin C (ASCORBIC ACID) 500  MG tablet Take 1,000 mg by mouth daily.    [provider]    Allergies    Ibuprofen and Shrimp [shellfish allergy]  Review of Systems   Review of Systems  All other systems reviewed and are negative.  Physical Exam Updated Vital Signs BP (!) 142/88   Pulse 100   Temp 98 F (36.7 C) (Oral)   Resp 18   Ht 5\' 8"  (1.727 m)   Wt 79.4 kg   SpO2 100%   BMI 26.61 kg/m   Physical Exam Vitals and nursing note reviewed.  Constitutional:      General: He is not in acute distress.    Appearance: He  is well-developed.  HENT:     Head: Normocephalic and atraumatic.     Mouth/Throat:     Mouth: Mucous membranes are moist.  Eyes:     Conjunctiva/sclera: Conjunctivae normal.     Pupils: Pupils are equal, round, and reactive to light.  Cardiovascular:     Rate and Rhythm: Normal rate and regular rhythm.     Heart sounds: No murmur heard. Pulmonary:     Effort: Pulmonary effort is normal. No respiratory distress.     Breath sounds: Normal breath sounds. No wheezing or rales.  Abdominal:     General: There is no distension.     Palpations: Abdomen is soft.     Tenderness: There is no abdominal tenderness. There is no guarding or rebound.     Comments: Ostomy in the LLQ with formed brown stool and makeshift ostomy bag.  Mid-line surgical scar that is healed.  Bilateral nephrostomy tubes with draining urine and no flank tenderness  Musculoskeletal:        General: No tenderness. Normal range of motion.     Cervical back: Normal range of motion and neck supple.     Right lower leg: No edema.     Left lower leg: No edema.  Skin:    General: Skin is warm and dry.     Coloration: Skin is pale.     Findings: No erythema or rash.  Neurological:     Mental Status: He is alert and oriented to person, place, and time. Mental status is at baseline.  Psychiatric:        Mood and Affect: Mood normal.        Behavior: Behavior normal.     Comments: Awake, oriented    ED Results /  Procedures / Treatments   Labs (all labs ordered are listed, but only abnormal results are displayed) Labs Reviewed - No data to display  EKG None  Radiology No results found.  Procedures Procedures   Medications Ordered in ED Medications - No data to display  ED Course  I have reviewed the triage vital signs and the nursing notes.  Pertinent labs & imaging results that were available during my care of the patient were reviewed by me and considered in my medical decision making (see chart for details).    MDM Rules/Calculators/A&P                           Patient presenting today requesting change of his ostomy bag.  Patient is otherwise well-appearing.  He is awake alert and oriented.  He is communicating normally.  Vital signs are reassuring.  He has brown formed stool in his bag.  However it sounds like he has been refusing to take his medications.  He reports he is not taking his medications because he is not in any pain.  Patient has had lab work approximately 8 days ago and at that time had stable hemoglobin, improving white count and normal platelet count, BMP from 10 days ago showed normal renal function.  He denies any infectious symptoms and is otherwise well-appearing.  Will change ostomy and discharge patient home.  2:42 PM Spoke with family and caregiver at bedside.  Patient is otherwise been doing okay.  They do not feel like he eats or drinks enough but is otherwise well-appearing.  Encouraged them to offer him liquids and food frequently but he may just eat small amounts at once.  They were  given new ostomy supplies.  They have an appointment with Dr. [name] on Monday and then will ask about when he needs exchange of nephrostomy tubes.  Patient is stable for discharge.   Final Clinical Impression(s) / ED Diagnoses Final diagnoses:  Ostomy nurse consultation    Rx / DC Orders ED Discharge Orders     None        Blanchie Dessert, MD 01/30/21 1442

## 2021-01-31 ENCOUNTER — Other Ambulatory Visit: Payer: Self-pay | Admitting: *Deleted

## 2021-01-31 DIAGNOSIS — Z433 Encounter for attention to colostomy: Secondary | ICD-10-CM | POA: Diagnosis not present

## 2021-01-31 MED ORDER — HYDROMORPHONE HCL 2 MG PO TABS
4.0000 mg | ORAL_TABLET | Freq: Four times a day (QID) | ORAL | Status: DC | PRN
  Administered 2021-01-31: 4 mg via ORAL
  Filled 2021-01-31: qty 2

## 2021-02-03 ENCOUNTER — Other Ambulatory Visit: Payer: Self-pay

## 2021-02-03 ENCOUNTER — Ambulatory Visit (INDEPENDENT_AMBULATORY_CARE_PROVIDER_SITE_OTHER): Payer: Medicare Other | Admitting: Family Medicine

## 2021-02-03 ENCOUNTER — Inpatient Hospital Stay: Payer: Medicare Other | Attending: Oncology | Admitting: Oncology

## 2021-02-03 VITALS — BP 91/78 | HR 82 | Temp 97.9°F | Resp 18

## 2021-02-03 DIAGNOSIS — N1330 Unspecified hydronephrosis: Secondary | ICD-10-CM | POA: Diagnosis not present

## 2021-02-03 DIAGNOSIS — Z79899 Other long term (current) drug therapy: Secondary | ICD-10-CM | POA: Insufficient documentation

## 2021-02-03 DIAGNOSIS — C7951 Secondary malignant neoplasm of bone: Secondary | ICD-10-CM | POA: Diagnosis present

## 2021-02-03 DIAGNOSIS — Z91199 Patient's noncompliance with other medical treatment and regimen due to unspecified reason: Secondary | ICD-10-CM

## 2021-02-03 DIAGNOSIS — T451X5A Adverse effect of antineoplastic and immunosuppressive drugs, initial encounter: Secondary | ICD-10-CM | POA: Insufficient documentation

## 2021-02-03 DIAGNOSIS — D6481 Anemia due to antineoplastic chemotherapy: Secondary | ICD-10-CM | POA: Diagnosis not present

## 2021-02-03 DIAGNOSIS — Z192 Hormone resistant malignancy status: Secondary | ICD-10-CM

## 2021-02-03 DIAGNOSIS — C61 Malignant neoplasm of prostate: Secondary | ICD-10-CM | POA: Diagnosis present

## 2021-02-03 DIAGNOSIS — Z923 Personal history of irradiation: Secondary | ICD-10-CM | POA: Diagnosis not present

## 2021-02-03 DIAGNOSIS — Z9221 Personal history of antineoplastic chemotherapy: Secondary | ICD-10-CM | POA: Diagnosis not present

## 2021-02-03 MED ORDER — ONDANSETRON HCL 4 MG PO TABS: 4.0000 mg | ORAL_TABLET | Freq: Three times a day (TID) | ORAL | 0 refills | Status: DC | PRN

## 2021-02-03 NOTE — Progress Notes (Signed)
No show for appointment. Will send letter.   Naome Brigandi, MD  

## 2021-02-03 NOTE — Progress Notes (Signed)
Hematology and Oncology Follow Up Visit  Andre Glass [id number] 1950/11/27 70 y.o. 02/03/2021 10:08 AM [name], DOWelborn, [name], DO   Principle Diagnosis: 70 year old man with advanced prostate cancer with disease to the bone and lymphadenopathy diagnosed in December 2020.  He has castration-resistant after presenting with Gleason score 4+5 = 9 and a PSA of 366.  Prior Therapy: He is status post prostate biopsy completed on December 3 of 2020.  Pathology showed a Gleason score 4+5 = 9 with 12 out of 12 cores.  He is status post both of radiation therapy to the spine between T9 and L1.  He received 30 Gray in 10 fractions between January 15 and 28, 2021.   Firmagon 240 mg started on May 31, 2019.  Zytiga 1000 mg daily with prednisone 5 mg daily started in May 2021. Therapy discontinued in October of 2021 due to progression of disease.  Taxotere chemotherapy with 75 mg per metered squared every 3 weeks started in October 2021.  The dose was reduced to 60 mg per metered square after 3 cycles.   He completed 5 cycles of therapy.  He is status post radiation therapy to enlarging pelvic prostate cancer mass.  He received 30 Gray in 10 fractions completed April 2022.   Current therapy:   Eligard every 4 months.  He will receive Eligard today and repeated in 4 months.  Jevtana chemotherapy 20 mg per metered squared started on November 15, 2020.  Therapy has been interrupted due to prolonged hospitalization.   Interim History: Ms. [name] returns today for a follow-up visit.  Since the last visit, he was hospitalized between August 24 and December 20, 2020 after developing colonic perforation and required urgent laparotomy partial colectomy related to malignancy.  After his hospitalization, he required to have prolonged stay in a long-term care facility.  Since his discharge, he reports feeling weak and debilitated.  He is bedbound for the most part and has very limited quality of life  and performance status.  Medications: Updated on review. Current Outpatient Medications  Medication Sig Dispense Refill         Ferrous Sulfate (IRON PO) Take 1 tablet by mouth daily.     HYDROmorphone (DILAUDID) 4 MG tablet Take 1 tablet (4 mg total) by mouth every 4 (four) hours as needed for severe pain. 60 tablet 0   lidocaine-prilocaine (EMLA) cream Apply 1 application topically as needed. 30 g 0   morphine (MS CONTIN) 30 MG 12 hr tablet Take 1 tablet (30 mg total) by mouth every 8 (eight) hours. 90 tablet 0   Multiple Vitamin (MULTIVITAMIN WITH MINERALS) TABS tablet Take 1 tablet by mouth daily.     ondansetron (ZOFRAN) 4 MG tablet TAKE 1 TABLET (4 MG TOTAL) BY MOUTH 3 (THREE) TIMES DAILY. (Patient taking differently: Take 4 mg by mouth 3 (three) times daily as needed for nausea or vomiting. ) 20 tablet 0   oxybutynin (DITROPAN) 5 MG tablet Take 5 mg by mouth 3 (three) times daily as needed for bladder spasms.     Plecanatide (TRULANCE) 3 MG TABS Take 3 mg by mouth daily. 30 tablet 2   polyethylene glycol (MIRALAX) 17 g packet Take 17 g by mouth 2 (two) times daily. 60 each 0   predniSONE (DELTASONE) 5 MG tablet Take 1 tablet (5 mg total) by mouth daily with breakfast. 90 tablet 3   prochlorperazine (COMPAZINE) 10 MG tablet Take 1 tablet (10 mg total) by mouth every 6 (six) hours  as needed for nausea or vomiting. 30 tablet 0   senna (SENOKOT) 8.6 MG TABS tablet Take 2 tablets by mouth daily.      tamsulosin (FLOMAX) 0.4 MG CAPS capsule Take 1 capsule (0.4 mg total) by mouth daily after supper. (Patient taking differently: Take 0.4 mg by mouth daily as needed (to help with urination). ) 30 capsule 3   No current facility-administered medications for this visit.     Allergies:  Allergies  Allergen Reactions   Ibuprofen Anaphylaxis   Shrimp [Shellfish Allergy] Anaphylaxis       Physical Exam:         Blood pressure 91/78, pulse 82, temperature 97.9 F (36.6 C), resp.  rate 18, SpO2 98 %.      ECOG: 3   General appearance: Comfortable appearing without any discomfort Head: Normocephalic without any trauma Oropharynx: Mucous membranes are moist and pink without any thrush or ulcers. Eyes: Pupils are equal and round reactive to light. Lymph nodes: No cervical, supraclavicular, inguinal or axillary lymphadenopathy.   Heart:regular rate and rhythm.  S1 and S2 without leg edema. Lung: Clear without any rhonchi or wheezes.  No dullness to percussion. Abdomin: Soft, nontender, nondistended with good bowel sounds.  No hepatosplenomegaly. Musculoskeletal: No joint deformity or effusion.  Full range of motion noted. Neurological: No deficits noted on motor, sensory and deep tendon reflex exam. Skin: No petechial rash or dryness.  Appeared moist.                         Lab Results: Lab Results  Component Value Date   WBC 12.3 (H) 01/22/2021   HGB 8.8 (L) 01/22/2021   HCT 27.3 (L) 01/22/2021   MCV 91.0 01/22/2021   PLT 415 (H) 01/22/2021     Chemistry      Component Value Date/Time   NA 134 (L) 01/20/2021 0455   NA 143 11/25/2020 1554   K 3.7 01/20/2021 0455   CL 104 01/20/2021 0455   CO2 23 01/20/2021 0455   BUN 11 01/20/2021 0455   BUN 23 11/25/2020 1554   CREATININE 0.81 01/20/2021 0455   CREATININE 1.00 11/14/2020 1146      Component Value Date/Time   CALCIUM 8.3 (L) 01/20/2021 0455   ALKPHOS 183 (H) 12/21/2020 0359   AST 71 (H) 12/21/2020 0359   AST 35 11/14/2020 1146   ALT 54 (H) 12/21/2020 0359   ALT 45 (H) 11/14/2020 1146   BILITOT 0.3 12/21/2020 0359   BILITOT 0.3 11/14/2020 1146          Results for Andre Glass (MRN [id number]) as of 02/03/2021 10:11  Ref. Range 09/17/2020 10:50 11/15/2020 12:18  Prostate Specific Ag, Serum Latest Ref Range: 0.0 - 4.0 ng/mL 91.5 (H) 576.0 (H)           Impression and Plan:   70 year old man with:   1.    Advanced prostate cancer with lymphadenopathy and  disease to the bone diagnosed in 2020.  He has castration-resistant disease.   Treatment choices moving forward were reiterated.  These would include restarting Jevtana, transitioning to pluvecto Versus supportive care and hospice only.  Risks and benefits of all these approaches were reviewed.  After discussion, I recommended proceeding with hospice.  His performance status is poor and chemotherapy would not be helpful in this setting.  I recommended supportive care only and hospice enrollment.  After discussion today he is agreeable to proceed with.   2.  Androgen deprivation therapy: He has received Eligard on November 15, 2020.  This will be repeated in December.   3.  Bone directed therapy: I recommended calcium and vitamin D supplements at this time.  Delton See has been deferred.   4.  Bone pain: He is currently on Dilaudid pain is manageable.   5.  IV access: Port-A-Cath continues to be in place and will be in use as needed in the future.  6.  Hydronephrosis: Kidney function remains normal at this time with bilateral nephrostomy tube in place.  7.  Anemia: Related to malignancy and chemotherapy.  Hemoglobin currently at 8.8.  8.  Prognosis and life expectancy: His prognosis is poor with limited life expectancy of less than 6 months.  He would be a hospice candidate.  9.  Follow-up: Will be as needed in the future and will rely on hospice for management.     30  minutes were dedicated to this encounter.  Time spent on reviewing laboratory data, disease status update, treatment choices and future plan of care discussion.   Zola Button, MD 10/31/202210:08 AM

## 2021-02-05 ENCOUNTER — Telehealth: Payer: Self-pay

## 2021-02-05 NOTE — Telephone Encounter (Signed)
Received phone call from [place], [place] with [facility] regarding patient. Reports that stage II sacral wound has advanced and is now stage III. Andre Glass is requesting to treat wound with Dakins Solution and gauze, then moistened pad dressing once daily. Dakins solution would need to be sent to local [facility].  Also requesting DME order for Low air loss mattress due to sacral wound.   Also reports patient temperature of 99.0 axillary. Patient is asymptomatic at this time. Pain well controlled.    Patient also has Hospice consult tomorrow morning at 11.  Please route back to RN team once DME order has been placed.   Talbot Grumbling, RN

## 2021-02-06 ENCOUNTER — Other Ambulatory Visit: Payer: Self-pay | Admitting: Family Medicine

## 2021-02-06 DIAGNOSIS — L98429 Non-pressure chronic ulcer of back with unspecified severity: Secondary | ICD-10-CM

## 2021-02-06 MED ORDER — DAKINS (1/2 STRENGTH) 0.25 % EX SOLN: 1.0000 "application " | Freq: Once | CUTANEOUS | 0 refills | Status: AC

## 2021-02-06 NOTE — Telephone Encounter (Signed)
Community message sent to [employer] for low air loss mattress. Will await response.   Talbot Grumbling, RN

## 2021-02-06 NOTE — Telephone Encounter (Signed)
Please see below message from [employer].   I'll take a look and make sure we have all we need! Thank you!   Talbot Grumbling, RN

## 2021-02-07 ENCOUNTER — Telehealth: Payer: Self-pay | Admitting: *Deleted

## 2021-02-07 NOTE — Telephone Encounter (Signed)
Fax received from [facility] with comfort care orders to be signed by Dr. [name], orders signed & faxed.  Fax confirmation received.

## 2021-02-07 NOTE — Telephone Encounter (Signed)
Please see message from [employer] regarding needed documentation for mattress request.   Humberto Leep! I will need a DR. DX of that ulcer and measurements. Thank you!    Called [place] regarding measurements for wound. LVM for her to return call to office to provide further details regarding wound.   Talbot Grumbling, RN

## 2021-02-11 ENCOUNTER — Encounter: Payer: Self-pay | Admitting: *Deleted

## 2021-02-14 ENCOUNTER — Telehealth: Payer: Self-pay | Admitting: *Deleted

## 2021-02-14 NOTE — Telephone Encounter (Signed)
Certification Statement to [facility] signed by Dr. [name], faxed to them. Fax confirmation received.

## 2021-02-22 ENCOUNTER — Other Ambulatory Visit: Payer: Self-pay | Admitting: Oncology

## 2021-02-24 ENCOUNTER — Encounter: Payer: Self-pay | Admitting: Oncology

## 2021-03-04 ENCOUNTER — Telehealth: Payer: Self-pay | Admitting: *Deleted

## 2021-03-04 NOTE — Telephone Encounter (Signed)
PC to patient & his caregiver, [name] - informed them that Dr. [name] has no objection to the patient starting methadone as recommended by palliative care.  They verbalize understanding.

## 2021-03-04 NOTE — Telephone Encounter (Signed)
-----   Message from [name], MD sent at 03/04/2021  7:15 AM EST ----- I do not give methadone for pain. If hospice wants to give him that, I have no objections.  ----- Message ----- From: Rolene Course, RN Sent: 03/03/2021   1:52 PM EST To: [name], MD  Mr. Milagros Evener caregiver, Belenda Cruise, called & said that palliative care is suggesting that the patient go on methadone for his pain.  She also states they are trying to wean him from his other pain meds.  She is asking if you think this is a good option for Mr. Craighead.  Also asking why this would be a better alternative.  Please advise.  Thanks, Bethena Roys

## 2021-03-17 ENCOUNTER — Telehealth: Payer: Self-pay | Admitting: *Deleted

## 2021-03-17 NOTE — Telephone Encounter (Signed)
Andre Glass called to advise that he has nephrostomy tubes are leaking.  Hospice didn't express any concern.  The bag on the right was leaking and it was placed inside of a ziplock bag.   Hospice nurse said she would have to order the bag and should have at the next visit to replace.

## 2021-03-25 ENCOUNTER — Telehealth: Payer: Self-pay | Admitting: *Deleted

## 2021-03-25 NOTE — Telephone Encounter (Signed)
-----   Message from [name], MD sent at 03/25/2021  1:27 PM EST ----- I do not think IV fluids will help him.  I recommend continuing with hospice. ----- Message ----- From: Rolene Course, RN Sent: 03/25/2021   1:25 PM EST To: [name], MD  Mr Willenbring's caregiver [name] called today.  She wanted to make you aware of his current status - he is not swallowing, is on O2.  He is being seen by Hospice but she is asking if you think he should have IV fluids.  Hospice has informed her this is not a service they provide.  I informed her this would have to be done through [facility], not Hospice, and if the patient is receiving treatment such as IV fluids, Hospice would discharge Mr Buday as a patient.  However, she wants to know your opinion concerning the IV fluids & if you think he should have this tx.  Please advise.  Thanks, Bethena Roys

## 2021-03-25 NOTE — Telephone Encounter (Signed)
PC to Vanetta Shawl, informed her of Dr. Hazeline Junker advice as below, she verbalizes understanding.

## 2021-04-06 DEATH — deceased

## 2021-06-05 IMAGING — XA IR IMAGING GUIDED PORT INSERTION
1 series · 1 of 1 positions shown · non-contrast
Comparison: none

CLINICAL DATA: Prostate carcinoma, needs durable venous access for
planned treatment regimen.
TECHNIQUE: The procedure, risks, benefits, and alternatives were explained to
the patient. Questions regarding the procedure were encouraged and
answered. The patient understands and consents to the procedure.

[Series 300: line placements · 1 of 1 slices shown]
[im 1/1]
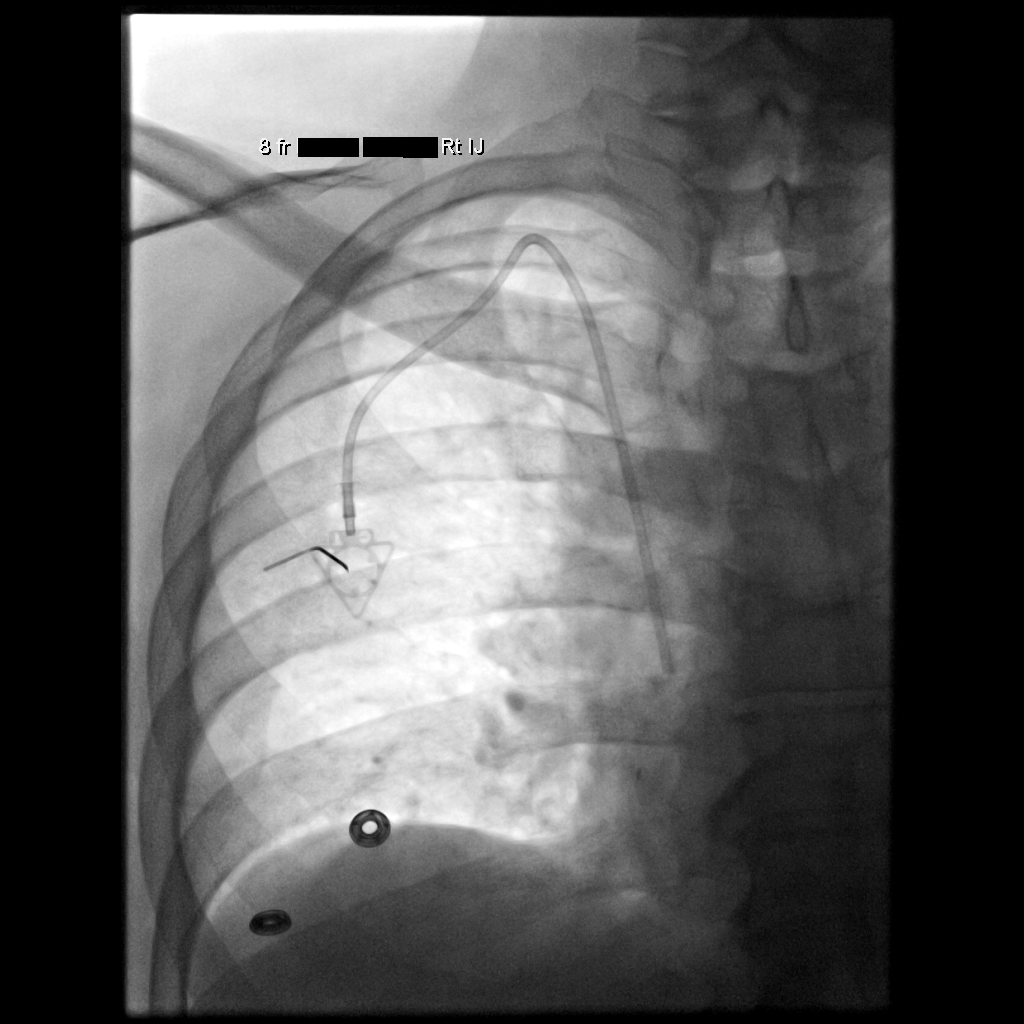

[1 of 1 positions shown; findings below may reference images not displayed]

EXAM:
TUNNELED PORT CATHETER PLACEMENT WITH ULTRASOUND AND FLUOROSCOPIC
GUIDANCE

FLUOROSCOPY TIME:  6 seconds; 1 mGy

ANESTHESIA/SEDATION:
Intravenous Fentanyl 144mcg and Versed 2mg were administered as
conscious sedation during continuous monitoring of the patient's
level of consciousness and physiological / cardiorespiratory status
by the radiology RN, with a total moderate sedation time of 15
minutes.
As antibiotic prophylaxis, cefazolin 2 g was ordered pre-procedure
and administered intravenously within one hour of incision.

Patency of the right IJ vein was confirmed with ultrasound with
image documentation. An appropriate skin site was determined. Skin
site was marked. Region was prepped using maximum barrier technique
including cap and mask, sterile gown, sterile gloves, large sterile
sheet, and Chlorhexidine as cutaneous antisepsis. The region was
infiltrated locally with 1% lidocaine. Under real-time ultrasound
guidance, the right IJ vein was accessed with a 21 gauge
micropuncture needle; the needle tip within the vein was confirmed
with ultrasound image documentation. Needle was exchanged over a 018
guidewire for transitional dilator, and vascular measurement was
performed.

A small incision was made on the right anterior chest wall and a
subcutaneous pocket fashioned. The power-injectable port was
positioned and its catheter tunneled to the right IJ dermatotomy
site. The transitional dilator was exchanged over an Amplatz wire
for a peel-away sheath, through which the port catheter, which had
been trimmed to the appropriate length, was advanced and positioned
under fluoroscopy with its tip at the cavoatrial junction. Spot
chest radiograph confirms good catheter position and no
pneumothorax. The port was flushed per protocol. The pocket was
closed with deep interrupted and subcuticular continuous 3-0
Monocryl sutures. The incisions were covered with Dermabond then
covered with a sterile dressing.

The patient tolerated the procedure well.

COMPLICATIONS:
COMPLICATIONS
None immediate
IMPRESSION: Technically successful right IJ power-injectable port catheter
placement. Ready for routine use.

## 2022-03-25 IMAGING — XA IR URETERAL STENT EXCHG ANTEGRADE
12 series · 13 of 13 positions shown · non-contrast
Comparison: None.

INDICATION: 70-year-old gentleman with history of prostate malignancy invading
and causing obstruction of the ureteral vesicular junctions.
Bilateral nephrostomy drains initially placed on 09/29/2020. Since
placement, patient has had multiple episodes of drain
retraction/dislodgement. Most recently on 11/02/2020, the left drain
had entirely dislodged and the right drain was significantly
retracted. The drain tracts could not be accessed and new
nephrostomy drains had to be placed.

[Series 1: fl (-) angio · 1 of 1 slices shown (1 of 11)]
[im 1/1]
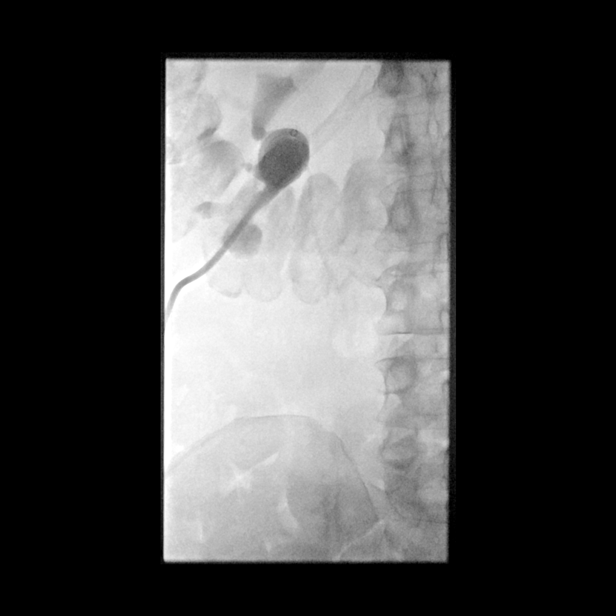

[Series 2: fl (-) angio · 1 of 1 slices shown (2 of 11)]
[im 1/1]
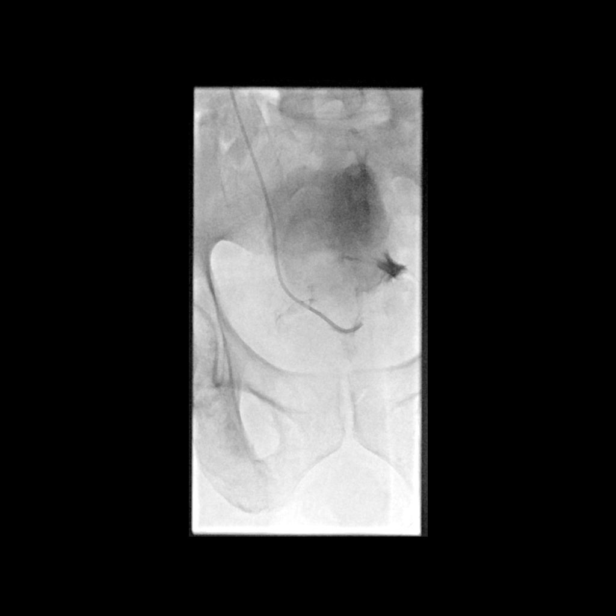

[Series 3: fl (-) angio · 1 of 1 slices shown (3 of 11)]
[im 1/1]
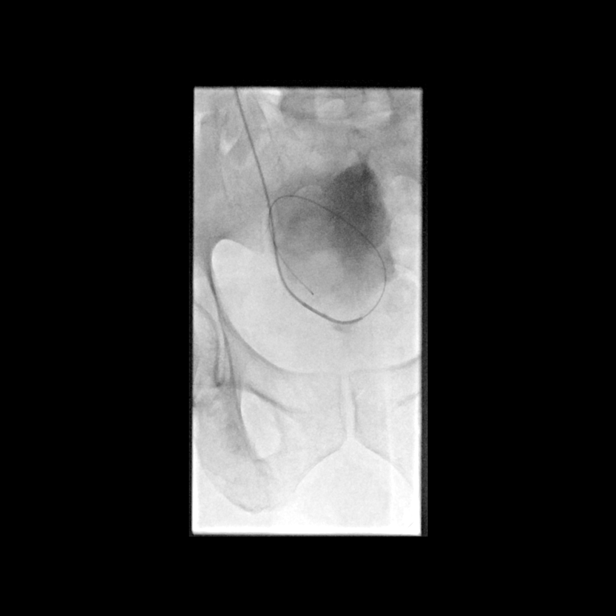

[Series 4: fl (-) angio · 1 of 1 slices shown (4 of 11)]
[im 1/1]
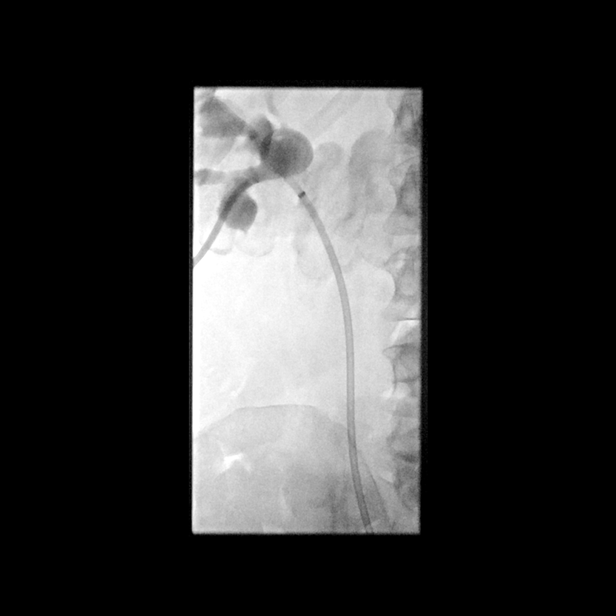

[Series 5: fl (-) angio · 1 of 1 slices shown (5 of 11)]
[im 1/1]
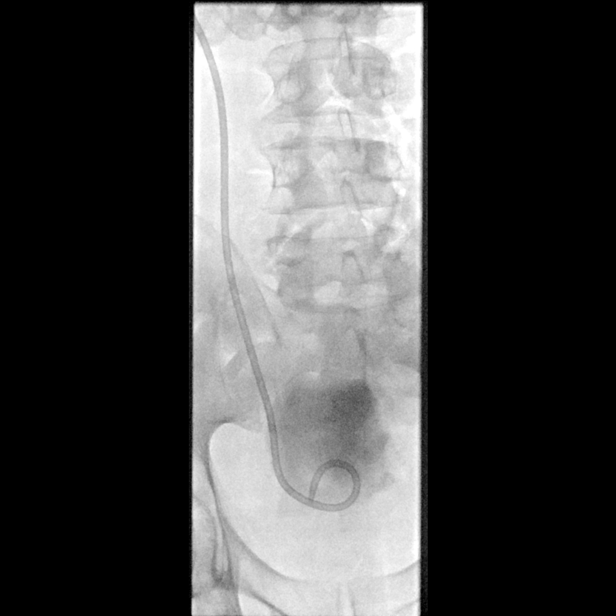

[Series 6: fl (-) angio · 1 of 1 slices shown (6 of 11)]
[im 1/1]
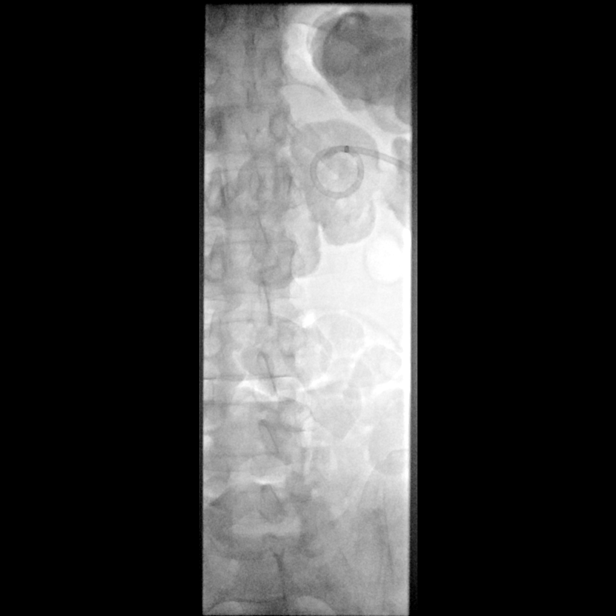

[Series 7: fl (-) angio · 1 of 1 slices shown (7 of 11)]
[im 1/1]
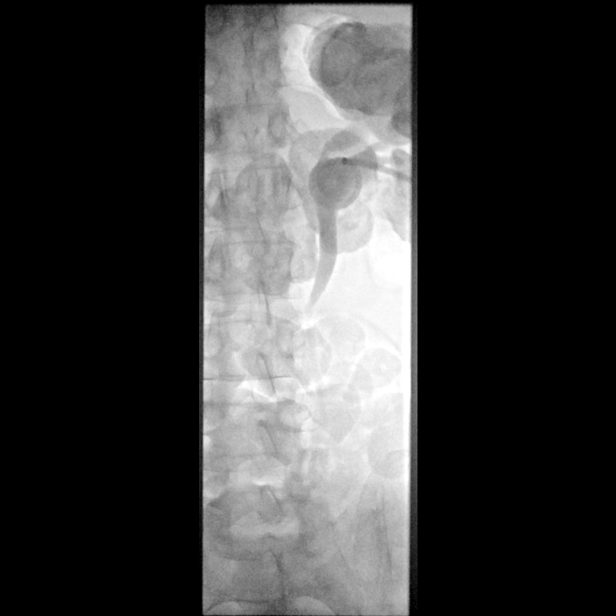

[Series 8: fl (-) angio · 1 of 1 slices shown (8 of 11)]
[im 1/1]
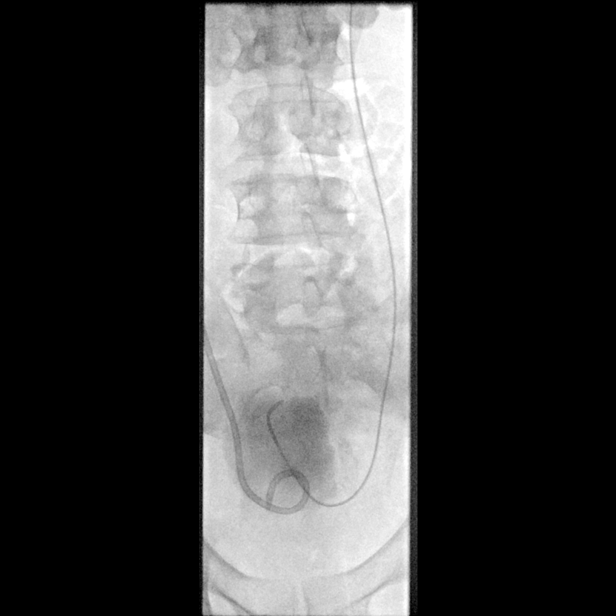

[Series 9: fl (-) angio · 1 of 1 slices shown (9 of 11)]
[im 1/1]
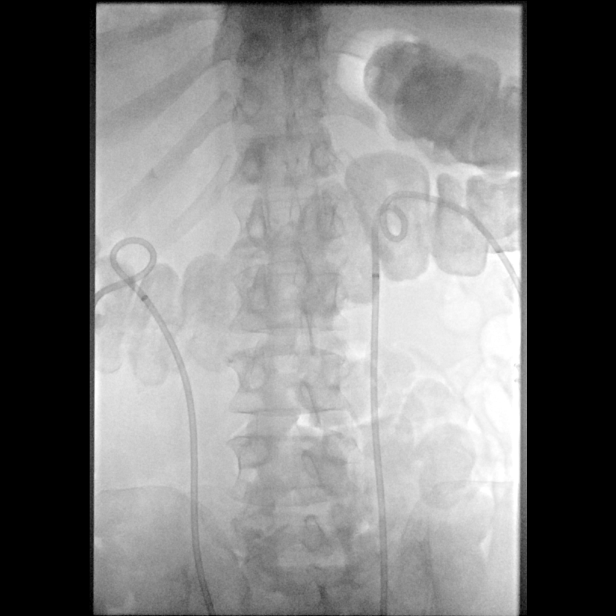

[Series 10: fl (-) angio · 1 of 1 slices shown (10 of 11)]
[im 1/1]
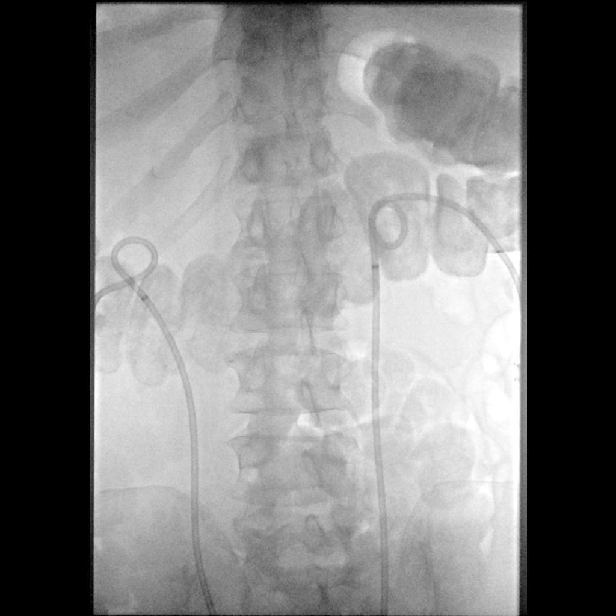

[Series 11: single · 1 of 1 slices shown]
[im 1/1]
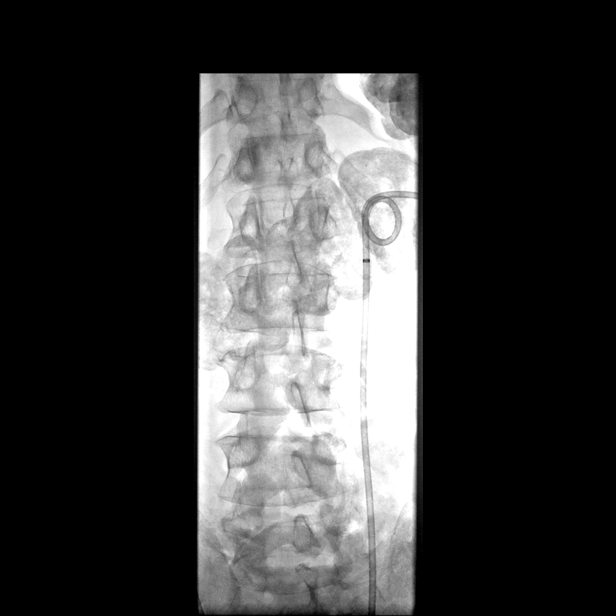

[Series 12: fl (-) angio · 2 of 2 slices shown (11 of 11)]
[im 1/2]
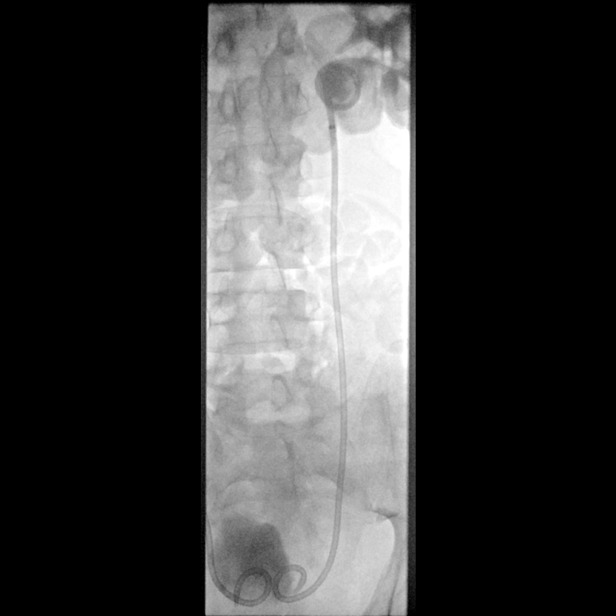
[im 2/2]
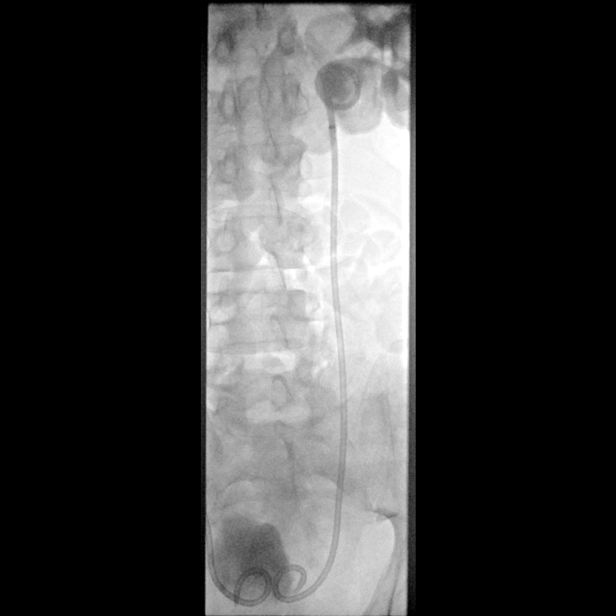

[13 of 13 positions shown; findings below may reference images not displayed]

Patient was referred by Dr. Minoo for ureteral stent placement. Given
the tumor burden within the bladder, I believe there is a high
probability of ureteral stents clogging. I also believe cystoscopy
for this patient would be difficult given the prostate/bladder mass.

I discussed with Dr. Minoo, that I recommended the patient have
bilateral nephroureteral drains placed as these would be
significantly more stable than nephrostomy tubes alone, and they
could be flushed which would limit stent clogging. Furthermore,
these drains could be capped as long as the patient remained
asymptomatic. Dr. Minoo was agreeable with this plan.

EXAM:
Fluoroscopy guided conversion of bilateral nephrostomy drains to
nephroureteral drains.
MEDICATIONS:
Rocephin 2 gm IV; The antibiotic was administered in an appropriate
time frame prior to skin puncture.

ANESTHESIA/SEDATION:
Fentanyl 50 mcg IV; Versed 1 mg IV

Moderate Sedation Time:  47 minutes

The patient was continuously monitored during the procedure by the
interventional radiology nurse under my direct supervision.

CONTRAST:  20 mL of Omnipaque 300-administered into the collecting
system(s)

FLUOROSCOPY TIME:  Fluoroscopy Time: 4 minutes 42 seconds (16 mGy).

COMPLICATIONS:
None immediate.

PROCEDURE:
Informed written consent was obtained from the patient after a
thorough discussion of the procedural risks, benefits and
alternatives. All questions were addressed. Maximal Sterile Barrier
Technique was utilized including caps, mask, sterile gowns, sterile
gloves, sterile drape, hand hygiene and skin antiseptic. A timeout
was performed prior to the initiation of the procedure.

Patient position prone on the procedure table. The external segment
of bilateral nephrostomy drain and surrounding skin prepped and
draped in usual fashion.

Scout images demonstrated the nephrostomy drains in appropriate
position.

The left drain was cut and removed over 0.035 inch glidewire. Kumpe
catheter and glidewire were advanced to the bladder. Contrast
administered through the Kumpe the catheter under fluoroscopy
confirmed appropriate access to the bladder lumen. Glidewire
exchanged for Amplatz wire and 10.2 French 24 cm nephroureteral
drain was inserted. Contrast administered through the drain
confirmed appropriate positioning within the renal pelvis and
bladder.

The right drain was cut and removed over 0.035 inch glidewire. Kumpe
catheter and glidewire were advanced to the bladder. Contrast
administered through the Kumpe the catheter under fluoroscopy
confirmed appropriate access to the bladder lumen. Glidewire
exchanged for Amplatz wire and 10.2 French 24 cm nephroureteral
drain was inserted. Contrast administered through the drain
confirmed appropriate positioning within the renal pelvis and
bladder.

Both drains were flushed and capped.
IMPRESSION: Bilateral nephrostomy drains exchanged for 10.2 French 24 cm
nephroureteral drains.

PLAN:
The drains were capped. The patient was provided with bags and
instructed to reattached the bags if he develops any symptoms of
urinary obstruction such as pain, fever, or chills.

The patient should return in 12 weeks for routine exchange.
